# Patient Record
Sex: Female | Born: 1965
Health system: Southern US, Community
[De-identification: ages and names within clinical notes are randomized; demographics above are authoritative.]

## PROBLEM LIST (undated history)

## (undated) DIAGNOSIS — M329 Systemic lupus erythematosus, unspecified: Secondary | ICD-10-CM

## (undated) DIAGNOSIS — M359 Systemic involvement of connective tissue, unspecified: Secondary | ICD-10-CM

## (undated) DIAGNOSIS — I890 Lymphedema, not elsewhere classified: Secondary | ICD-10-CM

## (undated) DIAGNOSIS — M3500 Sicca syndrome, unspecified: Secondary | ICD-10-CM

## (undated) DIAGNOSIS — M1990 Unspecified osteoarthritis, unspecified site: Secondary | ICD-10-CM

## (undated) DIAGNOSIS — I7300 Raynaud's syndrome without gangrene: Secondary | ICD-10-CM

## (undated) DIAGNOSIS — M2550 Pain in unspecified joint: Secondary | ICD-10-CM

## (undated) DIAGNOSIS — R5382 Chronic fatigue, unspecified: Secondary | ICD-10-CM

## (undated) DIAGNOSIS — D6861 Antiphospholipid syndrome: Secondary | ICD-10-CM

## (undated) DIAGNOSIS — IMO0002 Reserved for concepts with insufficient information to code with codable children: Secondary | ICD-10-CM

## (undated) DIAGNOSIS — D696 Thrombocytopenia, unspecified: Secondary | ICD-10-CM

## (undated) DIAGNOSIS — K589 Irritable bowel syndrome without diarrhea: Secondary | ICD-10-CM

## (undated) DIAGNOSIS — F32A Depression, unspecified: Secondary | ICD-10-CM

## (undated) DIAGNOSIS — I82409 Acute embolism and thrombosis of unspecified deep veins of unspecified lower extremity: Secondary | ICD-10-CM

## (undated) DIAGNOSIS — F419 Anxiety disorder, unspecified: Secondary | ICD-10-CM

## (undated) DIAGNOSIS — R760 Raised antibody titer: Secondary | ICD-10-CM

## (undated) DIAGNOSIS — R0789 Other chest pain: Secondary | ICD-10-CM

## (undated) DIAGNOSIS — I2699 Other pulmonary embolism without acute cor pulmonale: Secondary | ICD-10-CM

## (undated) HISTORY — DX: Systemic lupus erythematosus, unspecified: M32.9

## (undated) HISTORY — PX: GANGLION CYST EXCISION: SHX1691

## (undated) HISTORY — PX: APPENDECTOMY: SHX54

## (undated) HISTORY — DX: Reserved for concepts with insufficient information to code with codable children: IMO0002

## (undated) HISTORY — PX: LAPAROTOMY: SHX154

## (undated) HISTORY — DX: Lymphedema, not elsewhere classified: I89.0

## (undated) HISTORY — PX: COLON SURGERY: SHX602

---

## 1999-04-03 ENCOUNTER — Other Ambulatory Visit: Admission: RE | Admit: 1999-04-03 | Discharge: 1999-04-03 | Payer: Self-pay | Admitting: Gynecology

## 2000-04-14 ENCOUNTER — Other Ambulatory Visit: Admission: RE | Admit: 2000-04-14 | Discharge: 2000-04-14 | Payer: Self-pay | Admitting: Gynecology

## 2000-05-09 ENCOUNTER — Other Ambulatory Visit: Admission: RE | Admit: 2000-05-09 | Discharge: 2000-05-09 | Payer: Self-pay | Admitting: Gastroenterology

## 2000-07-17 ENCOUNTER — Encounter: Payer: Self-pay | Admitting: Gastroenterology

## 2000-07-17 ENCOUNTER — Ambulatory Visit (HOSPITAL_COMMUNITY): Admission: RE | Admit: 2000-07-17 | Discharge: 2000-07-17 | Payer: Self-pay | Admitting: Gastroenterology

## 2001-01-19 ENCOUNTER — Encounter: Payer: Self-pay | Admitting: Gastroenterology

## 2001-01-19 ENCOUNTER — Ambulatory Visit (HOSPITAL_COMMUNITY): Admission: RE | Admit: 2001-01-19 | Discharge: 2001-01-19 | Payer: Self-pay | Admitting: Gastroenterology

## 2001-01-22 ENCOUNTER — Ambulatory Visit (HOSPITAL_COMMUNITY): Admission: RE | Admit: 2001-01-22 | Discharge: 2001-01-22 | Payer: Self-pay | Admitting: Gastroenterology

## 2001-01-22 ENCOUNTER — Encounter: Payer: Self-pay | Admitting: Gastroenterology

## 2001-01-26 ENCOUNTER — Encounter: Payer: Self-pay | Admitting: Gastroenterology

## 2001-01-26 ENCOUNTER — Ambulatory Visit (HOSPITAL_COMMUNITY): Admission: RE | Admit: 2001-01-26 | Discharge: 2001-01-26 | Payer: Self-pay | Admitting: Gastroenterology

## 2001-01-29 ENCOUNTER — Encounter (INDEPENDENT_AMBULATORY_CARE_PROVIDER_SITE_OTHER): Payer: Self-pay | Admitting: Specialist

## 2001-01-29 ENCOUNTER — Inpatient Hospital Stay (HOSPITAL_COMMUNITY): Admission: RE | Admit: 2001-01-29 | Discharge: 2001-02-07 | Payer: Self-pay | Admitting: Gastroenterology

## 2001-01-29 ENCOUNTER — Encounter: Payer: Self-pay | Admitting: Internal Medicine

## 2001-01-29 ENCOUNTER — Ambulatory Visit (HOSPITAL_COMMUNITY): Admission: RE | Admit: 2001-01-29 | Discharge: 2001-01-29 | Payer: Self-pay | Admitting: Pulmonary Disease

## 2001-01-29 ENCOUNTER — Encounter: Payer: Self-pay | Admitting: Gastroenterology

## 2001-06-29 ENCOUNTER — Other Ambulatory Visit: Admission: RE | Admit: 2001-06-29 | Discharge: 2001-06-29 | Payer: Self-pay | Admitting: Gynecology

## 2002-07-26 ENCOUNTER — Other Ambulatory Visit: Admission: RE | Admit: 2002-07-26 | Discharge: 2002-07-26 | Payer: Self-pay | Admitting: Gynecology

## 2002-09-09 ENCOUNTER — Encounter: Payer: Self-pay | Admitting: Emergency Medicine

## 2002-09-10 ENCOUNTER — Inpatient Hospital Stay (HOSPITAL_COMMUNITY): Admission: EM | Admit: 2002-09-10 | Discharge: 2002-09-11 | Payer: Self-pay | Admitting: Emergency Medicine

## 2002-09-13 ENCOUNTER — Encounter: Admission: RE | Admit: 2002-09-13 | Discharge: 2002-09-13 | Payer: Self-pay | Admitting: Internal Medicine

## 2002-09-14 ENCOUNTER — Encounter: Admission: RE | Admit: 2002-09-14 | Discharge: 2002-09-14 | Payer: Self-pay | Admitting: Internal Medicine

## 2002-09-16 ENCOUNTER — Encounter: Admission: RE | Admit: 2002-09-16 | Discharge: 2002-09-16 | Payer: Self-pay | Admitting: Internal Medicine

## 2002-09-20 ENCOUNTER — Encounter: Admission: RE | Admit: 2002-09-20 | Discharge: 2002-09-20 | Payer: Self-pay | Admitting: Internal Medicine

## 2002-09-23 ENCOUNTER — Encounter: Payer: Self-pay | Admitting: Internal Medicine

## 2002-09-23 ENCOUNTER — Ambulatory Visit (HOSPITAL_COMMUNITY): Admission: RE | Admit: 2002-09-23 | Discharge: 2002-09-23 | Payer: Self-pay | Admitting: Internal Medicine

## 2002-09-23 ENCOUNTER — Encounter: Admission: RE | Admit: 2002-09-23 | Discharge: 2002-09-23 | Payer: Self-pay | Admitting: Internal Medicine

## 2002-09-24 ENCOUNTER — Encounter: Admission: RE | Admit: 2002-09-24 | Discharge: 2002-09-24 | Payer: Self-pay | Admitting: Internal Medicine

## 2002-09-27 ENCOUNTER — Encounter: Admission: RE | Admit: 2002-09-27 | Discharge: 2002-09-27 | Payer: Self-pay | Admitting: Internal Medicine

## 2002-10-04 ENCOUNTER — Encounter: Admission: RE | Admit: 2002-10-04 | Discharge: 2002-10-04 | Payer: Self-pay | Admitting: Internal Medicine

## 2002-10-21 ENCOUNTER — Ambulatory Visit (HOSPITAL_COMMUNITY): Admission: RE | Admit: 2002-10-21 | Discharge: 2002-10-21 | Payer: Self-pay | Admitting: Internal Medicine

## 2002-10-21 ENCOUNTER — Encounter: Admission: RE | Admit: 2002-10-21 | Discharge: 2002-10-21 | Payer: Self-pay | Admitting: Internal Medicine

## 2003-05-25 ENCOUNTER — Encounter: Payer: Self-pay | Admitting: Gastroenterology

## 2003-05-25 ENCOUNTER — Ambulatory Visit (HOSPITAL_COMMUNITY): Admission: RE | Admit: 2003-05-25 | Discharge: 2003-05-25 | Payer: Self-pay | Admitting: Gastroenterology

## 2003-05-27 ENCOUNTER — Ambulatory Visit (HOSPITAL_COMMUNITY): Admission: RE | Admit: 2003-05-27 | Discharge: 2003-05-27 | Payer: Self-pay | Admitting: Gastroenterology

## 2003-05-27 ENCOUNTER — Encounter: Payer: Self-pay | Admitting: Gastroenterology

## 2003-07-12 ENCOUNTER — Ambulatory Visit (HOSPITAL_COMMUNITY): Admission: RE | Admit: 2003-07-12 | Discharge: 2003-07-12 | Payer: Self-pay | Admitting: Gastroenterology

## 2003-07-28 ENCOUNTER — Other Ambulatory Visit: Admission: RE | Admit: 2003-07-28 | Discharge: 2003-07-28 | Payer: Self-pay | Admitting: Gynecology

## 2003-12-26 ENCOUNTER — Ambulatory Visit (HOSPITAL_COMMUNITY): Admission: RE | Admit: 2003-12-26 | Discharge: 2003-12-26 | Payer: Self-pay

## 2004-08-22 ENCOUNTER — Other Ambulatory Visit: Admission: RE | Admit: 2004-08-22 | Discharge: 2004-08-22 | Payer: Self-pay | Admitting: Gynecology

## 2005-09-18 ENCOUNTER — Other Ambulatory Visit: Admission: RE | Admit: 2005-09-18 | Discharge: 2005-09-18 | Payer: Self-pay | Admitting: Gynecology

## 2009-01-12 ENCOUNTER — Ambulatory Visit: Payer: Self-pay | Admitting: Internal Medicine

## 2011-03-29 NOTE — Op Note (Signed)
   [name], [name]                          ACCOUNT NO.:  [id number]   MEDICAL RECORD NO.:  [id number]                   PATIENT TYPE:  AMB   LOCATION:  ENDO                                 FACILITY:  [facility]   PHYSICIAN:  Vania Rea. Jarold Motto, M.D. [facility]        DATE OF BIRTH:  Mar 15, 1966   DATE OF PROCEDURE:  07/12/2003  DATE OF DISCHARGE:  07/12/2003                                 OPERATIVE REPORT   PROCEDURE:  Esophageal manometry.   RESULTS:  1. Upper esophageal sphincter:  The upper esophageal sphincter is somewhat     difficult to evaluate, but there appears to be normal coordination     between pharyngeal contraction and cricopharyngeal relaxation.  2. Lower esophageal sphincter:  Mean pressure is approximately 15 mmHg with     normal relaxation on swallowing.  3. Motility pattern:  There are normally-propagated peristaltic waves of     normal amplitude and duration throughout the length of the esophagus to     wet and dry swallows.  Mean amplitude of esophageal contractions is 105     mmHg.   ASSESSMENT:  This is a normal esophageal manometry except for borderline  incompetent lower esophageal sphincter.  There is no evidence of underlying  esophageal motility disorder.                                               Vania Rea. Jarold Motto, M.D. [facility]    DRP/MEDQ  D:  07/21/2003  T:  07/22/2003  Job:  [id number]   cc:   Ulyess Mort, M.D. [facility]

## 2011-03-29 NOTE — Discharge Summary (Signed)
Erica [name]                          ACCOUNT NO.:  [id number]   MEDICAL RECORD NO.:  [id number]                   PATIENT TYPE:  INP   LOCATION:  4713                                 FACILITY:  [facility]   PHYSICIAN:  Catalina Pizza, M.D.                     DATE OF BIRTH:  [birth year]   DATE OF ADMISSION:  09/09/2002  DATE OF DISCHARGE:  09/11/2002                                 DISCHARGE SUMMARY   DISCHARGE DIAGNOSES:  1. Bilateral pulmonary emboli.  2. Left lower extremity deep venous thrombosis.  3. Anxiety/depression.   DISCHARGE MEDICATIONS:  1. Lovenox 45 mg injection subcu q.12h.  2. Coumadin 5 mg p.o. q.d.   CHIEF COMPLAINT:  Calf and ankle pain and swelling.   HISTORY OF PRESENT ILLNESS:  The patient is a 45 year old white female with  unclear past medical history.  The patient said that she had had a previous  stomach problem and some rheumatologic work-up mentioning antiphospholipid  syndrome but no treatment with Coumadin or anything else.  No history of  abortion.  No history of previous DVT or PE.  Positive family history in  that mother had PEs at the age of 61.  The patient complained of pain in  left lower extremity about six to eight weeks and progressively has gotten  worse over the last several days prior to admission.  She felt like it was  bruised. She also noted that she had had some swelling in her left ankle as  well.  She had some shortness of breath a week prior to admission but  attributed this to anxiety.  No chest pain, no cough, no hemoptysis.   ALLERGIES:  SULFA.   PAST MEDICAL HISTORY:  1. Stomach problem x5 years, question IBS.  2. Status post abdominal surgery for ruptured bowel in 2002 after     colonoscopy.  3. Raynaud's.  4. Antiphospholipid syndrome diagnosed approximately two years ago.  5. 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  6. Anxiety.   MEDICATION PRIOR TO ADMISSION:  Ortho Novum.   HABITS:  She smokes about a half a  pack per day x5 years.  Occasional  alcohol consumption.  No other drug use.   SOCIAL HISTORY:  She is married.  She is a Designer, industrial/product at [employer].  She lives  alone.  She has other insurance for her prescriptions.   FAMILY HISTORY:  Mother had a history of PE at age 55, father is healthy.  Sister has Ewing's sarcoma.   LABORATORY DATA:  The labs on discharge or during admission showed  hemoglobin 10.4, hematocrit 31.7, platelet count 156.  PT of 18.1, INR 1.6,  PTT 36.  Sodium 134, potassium 3.3, chloride 98, CO2 28, glucose 90, BUN 2,  creatinine 0.7, calcium 8.5, total protein 5.9, albumin 3.0.  AST 20, ALT  15, alkaline phosphatase 49, total bilirubin 0.5.  TSH 2.486.   CT scan showed bilateral pulmonary emboli at least three nodular opacities  within the lungs as described.  Consider interval follow-up in four to five  months.  Lower extremity DVT within the left popliteal vein.  No other  evidence for DVT.   HOSPITAL COURSE:  #1 - PULMONARY EMBOLI AND DEEP VENOUS THROMBOSIS:  Throughout hospitalization the swelling and pain decreased in the left lower  extremity.  The patient never reported having any shortness of breath  while  in the hospital.  She was initially started on Lovenox 1 mg/kg and also  started on Coumadin.  In referring back to old records which she had  attained by Dr. Syliva Overman to help sort out exactly her rheumatologic  disease.  Records were attained from 2001 which showed Sojourn's anti-SSA of  30 units.  It also showed anticardiolipin antibody IgM at 28 and IgG of less  than 6.  The patient was set up for follow-up within two days after  discharge, on September 13, 2002, to see Dr. Margo Aye for further evaluation and  PT/INR to adjust Coumadin appropriately.  The patient was told not to  continue her birth control pills until later date due to the risk of  increasing risk of pulmonary embolism or recurrent DVT.   #2 - SMOKING CESSATION:  The patient was  encouraged to decrease her smoking,  given her current risk factors.  There was understanding of this and agreed  that she would try to limit her smoking habits.   DISPOSITION:  The patient  is to return to outpatient clinic at [facility]  [facility] on Monday, September 13, 2002, at 1:30 for further lab  work and evaluation.  She is to return to the emergency department if any  symptoms of shortness of breath or pain in chest, swelling or pain in the  legs returns                                               Catalina Pizza, M.D.    ZH/MEDQ  D:  09/21/2002  T:  09/22/2002  Job:  [id number]   cc:   Alvester Morin, M.D.

## 2011-03-29 NOTE — H&P (Signed)
[facility]  Patient:    Erica [name], Erica [name]                       MRN: [id number] Adm. Date:  [id number] Disc. Date: [id number] Attending:  Charmaine Downs Dictator:   Mike Gip, P.A.                         History and Physical  CHIEF COMPLAINT:  Abdominal pain.  HISTORY OF PRESENT ILLNESS:  Erica [name] is a very pleasant 45 year old white female with history of recently diagnosed Sjogrens syndrome and antiphospholipid disorder.  There is a question of a mixed connective tissue disease.  She also has Raynauds.  The patient has been undergoing evaluation for complaints of persistent epigastric pain exacerbated by meals and associated nausea, abdominal bloating, and discomfort; all again worse postprandially.  She also has difficulty with constipation.  The patient has sustained about a 5-pound weight loss over the past several months from 109 pounds to 104 pounds currently.  She says she hurts when she eats and has not been eating regular meals for several months due to pain.  She has been using Aciphex on a p.r.n. basis and Lipram on a p.r.n. basis.  She has undergone gastric emptying scan, which was normal.  Upper endoscopy with a question of gastric atony.  She did have small bowel biopsies done, which were negative.  More recently, she underwent CT scan of the abdomen and pelvis on January 19, 2001 showing mildly prominent loops of small bowel in the left mid abdomen; otherwise negative exam.  Small bowel follow-through was suggested and she had this on January 27, 2001, which was normal.  The patient then underwent colonoscopy today for further evaluation and was noted to have a dilated colon but no other abnormality.  A random biopsy was taken from the right colon.  The patient returned home postprocedure but developed abdominal pain in the lower abdomen and called the office.  She was advised to come to x-ray, where KUB shows an apparent  pneumatosis of the left colon and no free air.  The patient is admitted at this time for bowel rest, pain control, and urgent CT scan of the abdomen and pelvis.  CURRENT MEDICATIONS: 1. Aciphex 20 mg p.r.n. 2. Birth control pill daily. 3. Lipram 1 p.o. a.c. p.r.n. 4. Reglan 5 mg a.c. p.r.n.  ALLERGIES:  SULFA.  PAST MEDICAL HISTORY:  Prior appendectomy.  FAMILY HISTORY:  One sister deceased with Ewings sarcoma.  No other GI history.  SOCIAL HISTORY:  The patient is married.  She is employed full-time.  No children.  She is a smoker of 1/2 pack per day.  No regular ETOH.  REVIEW OF SYSTEMS:  Cardiovascular:  Denies any chest pain or anginal symptoms.  Pulmonary:  Negative for cough, shortness of breath, or sputum production.  Genitourinary:  Negative.  Musculoskeletal:  Pertinent for Raynauds symptoms.  PHYSICAL EXAMINATION:  GENERAL:  A well-developed, thin white female in no acute distress.  VITAL SIGNS:  In the emergency room, temperature is 97, pulse 78, respirations 18.  Blood pressure 70/32 initially, with fluids up to 96/58.  ABDOMEN:  Soft, nondistended.  Bowel sounds are present but hypoactive.  She is tender in the right and left lower quadrant, right greater then left.  No guarding.  She does have rebound on both lower quadrants.  No mass or hepatosplenomegaly.  RECTAL:  Exam is not  done at this time.  EXTREMITIES:  No clubbing, cyanosis, or edema.  Fingertips are cool.  LABORATORY DATA:  Labs on admission show a WBC of 17.6, hemoglobin 11.7, hematocrit 34.0, MCV 90, platelets 145.  Potassium 3.4, BUN 5, creatinine 0.9. Liver function studies are normal.  PTT 29, pro time 14.7, INR 1.3.  IMPRESSION: 75. A 45 year old white female with acute abdominal pain post colonoscopy with    apparent pneumatosis of the descending colon on plain films.  Rule out    perforation. 2. Underlying persistent nausea, epigastric pain, and weight loss possibly    related to  connective tissue disorder. 3. Sjogrens syndrome and Raynauds. 4. Status post appendectomy.  PLAN:  The patient is admitted to the service of Dr. Yancey Flemings, who is covering the hospital.  She will be kept n.p.o., hydrated with fluids.  We will cover her with IV Unasyn, check urgent CT scan of the abdomen and pelvis with contrast.  She may need surgical consultation pending results of the CT. She will also be covered with IV Protonix.  If CT scan does not show any evidence of free air, we will repeat plain films in the a.m. and continue her at bowel rest.DD:  01/30/01 TD:  01/31/01 Job: [id number]

## 2011-03-29 NOTE — Consult Note (Signed)
[facility]  Patient:    Erica [name], Erica [name]                       MRN: [id number] Proc. Date: 01/29/01 Adm. Date:  [id number] Disc. Date: [id number] Attending:  Charmaine Downs CC:         Hedwig Morton. Juanda Chance, M.D. [facility]   Consultation Report  REASON FOR CONSULTATION:  Bowel perforation, free air.  CLINICAL HISTORY:  This patient is a 45 year old whose history is obtained both from the patient from the chart and from Dr. Juanda Chance.  She is a 45 year old woman being evaluated for chronic abdominal pain. She is also apparently diagnosed with Raynauds phenomenom and Sjogrens syndrome. She has had a CT scan that apparently showed a little dilated small bowel but was otherwise negative. She has had a gastric emptying scan in the past that was negative and apparently she recently had an upper endoscopy that was negative.  This morning she had a colonoscopy with some mucosal biopsies. Reportedly the colonoscopy was basically negative. She returned home after the procedure but developed severe abdominal pain in her lower abdomen and x-ray earlier today apparently showed pneumatosis of the left colon but no free air. She was admitted for bowel rest, fluids, pain control, and a CT.  PAST MEDICAL HISTORY:  Noted in the admission note, not redictated here.  LABORATORY DATA:  Initial laboratory studies showed a white count of 12,000. CT scan was finally obtained late this evening and showed what appears to be a large amount of free air in the abdomen with some contrast from a prior small bowel series possibly having leaked into the peritoneal cavity. In seeing the patient along with Dr. Juanda Chance, the patient and her husband relayed that her pain gas gotten worse this afternoon since she came to the emergency room although it did let up a little bit after some Demerol. She continues to complain of pain across the entire lower abdomen. She is not having pain any place  else.  PHYSICAL EXAMINATION:  GENERAL:  The patient is a very thin, nontoxic appearing female. She is alert and oriented.  VITAL SIGNS:  Pulses 78, blood pressure about 90/60.  HEENT:  Head is normocephalic. Eyes are nonicteric. Pupils are round and regular.  NECK:  Supple, no masses or thyromegaly.  LUNGS:  Clear to auscultation.  HEART:  Regular. No murmurs, rubs or gallops are heard.  ABDOMEN:  Relative soft, slightly distended but diffusely tender throughout with rebound tenderness. Bowel sounds are still present.  EXTREMITIES:  Show no cyanosis or edema.  As noted, I reviewed the CT scan with the above findings. Repeat white count this evening had changed from 12,000 earlier to 17,000.  IMPRESSION:  Apparent bowel perforation not clinically improving with elevation in white count, increased pain and increased free air.  PLAN:  I think we ought to proceed to exploratory laparotomy and not sure where the perforation is based on current findings but since she has not gotten a rigid abdomen and ______, I think this must be a fairly small perforation. I have discussed this with the patient and with the husband. The husband gives informed consent the patient has recently had Demerol. I think that all questions for both of them are answered. DD:  01/30/01 TD:  01/30/01 Job: [id number]

## 2012-07-09 ENCOUNTER — Other Ambulatory Visit: Payer: Self-pay | Admitting: Internal Medicine

## 2012-07-09 DIAGNOSIS — I83819 Varicose veins of unspecified lower extremities with pain: Secondary | ICD-10-CM

## 2012-07-14 ENCOUNTER — Other Ambulatory Visit: Payer: Self-pay

## 2012-07-14 ENCOUNTER — Inpatient Hospital Stay
Admission: RE | Admit: 2012-07-14 | Discharge: 2012-07-14 | Payer: Self-pay | Source: Ambulatory Visit | Attending: Internal Medicine | Admitting: Internal Medicine

## 2012-08-06 ENCOUNTER — Ambulatory Visit
Admission: RE | Admit: 2012-08-06 | Discharge: 2012-08-06 | Disposition: A | Discharge status: Home / Self Care | Payer: 59 | Source: Ambulatory Visit | Attending: Internal Medicine | Admitting: Internal Medicine

## 2012-08-06 DIAGNOSIS — I83819 Varicose veins of unspecified lower extremities with pain: Secondary | ICD-10-CM

## 2012-08-06 HISTORY — DX: Anxiety disorder, unspecified: F41.9

## 2012-08-06 HISTORY — DX: Pain in unspecified joint: M25.50

## 2012-08-06 HISTORY — DX: Acute embolism and thrombosis of unspecified deep veins of unspecified lower extremity: I82.409

## 2012-08-06 HISTORY — DX: Raised antibody titer: R76.0

## 2012-08-06 HISTORY — DX: Systemic involvement of connective tissue, unspecified: M35.9

## 2012-08-06 HISTORY — DX: Sjogren syndrome, unspecified: M35.00

## 2012-08-06 HISTORY — DX: Other pulmonary embolism without acute cor pulmonale: I26.99

## 2012-08-06 HISTORY — DX: Other chest pain: R07.89

## 2012-08-06 HISTORY — DX: Thrombocytopenia, unspecified: D69.6

## 2012-08-06 HISTORY — DX: Raynaud's syndrome without gangrene: I73.00

## 2012-08-06 HISTORY — DX: Chronic fatigue, unspecified: R53.82

## 2012-08-06 HISTORY — DX: Antiphospholipid syndrome: D68.61

## 2012-08-06 IMAGING — US US EXTREM LOW VENOUS BILAT
1 series · 13 of 24 positions shown · non-contrast
Comparison: None.

CLINICAL DATA: Lower extremity varicose veins, history of DVT,
evaluate for venous insufficiency

BILATERAL LOWER EXTREMITY VENOUS DUPLEX ULTRASOUND
TECHNIQUE: Gray-scale sonography with graded compression, as well
as color Doppler and duplex ultrasound, were performed to evaluate
the deep and superficial veins of both lower extremities.  Spectral
Doppler was utilized to evaluate flow at rest and with distal
augmentation maneuvers.  A complete superficial venous
insufficiency exam was performed in the upright standing position.
I personally performed the technical portion of the exam.

[Series 1: us extrem low venous bilat · 13 of 55 slices shown]
[im 1/55]
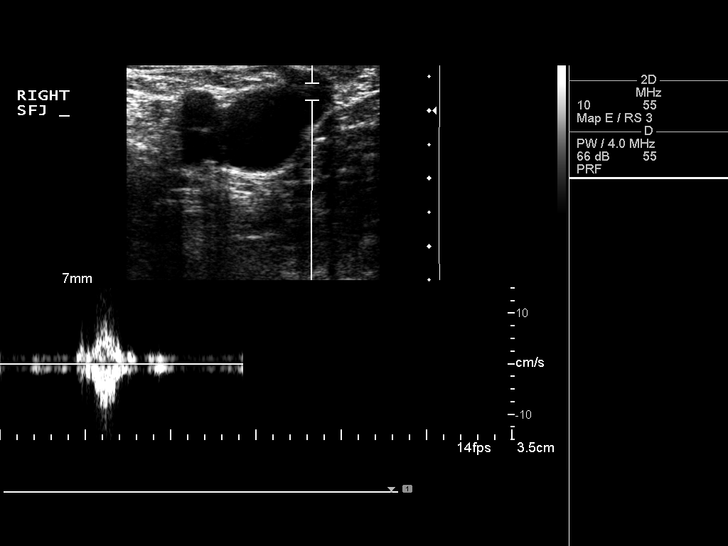
[im 5/55]
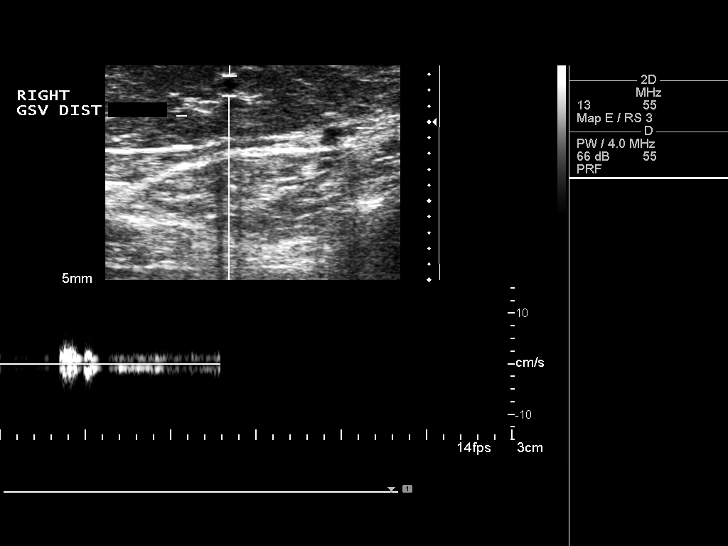
[im 10/55]
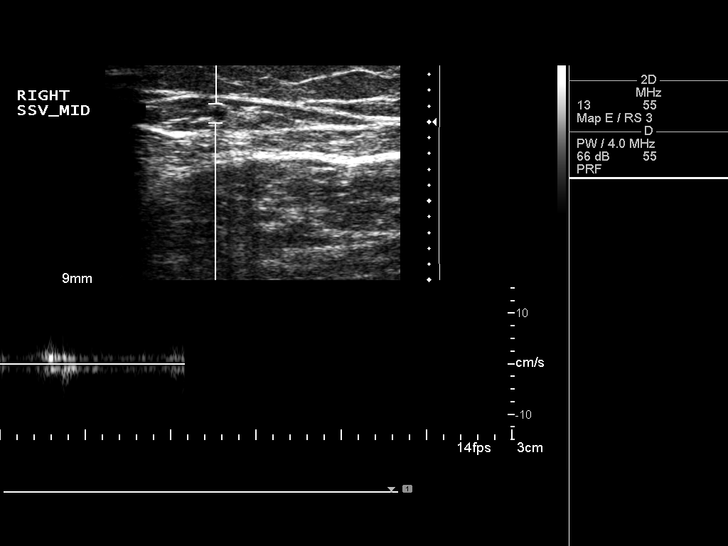
[im 15/55]
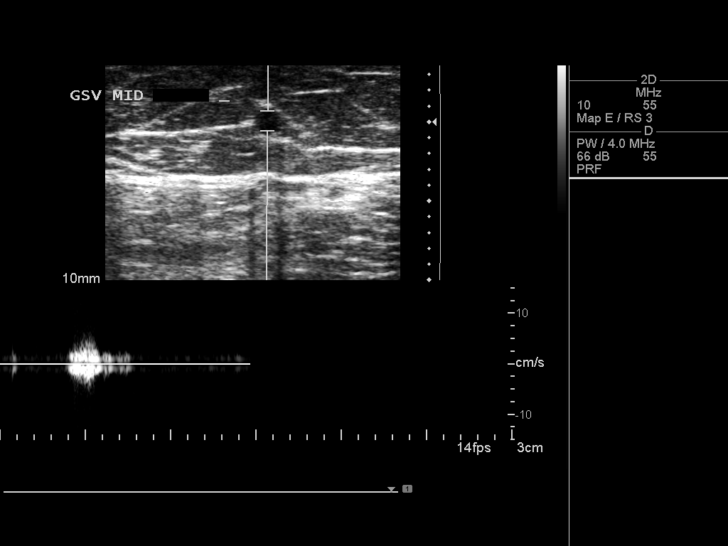
[im 19/55]
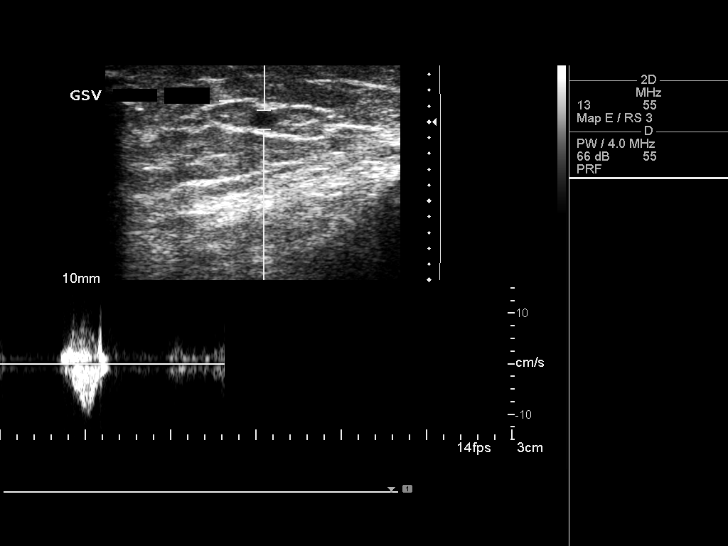
[im 24/55]
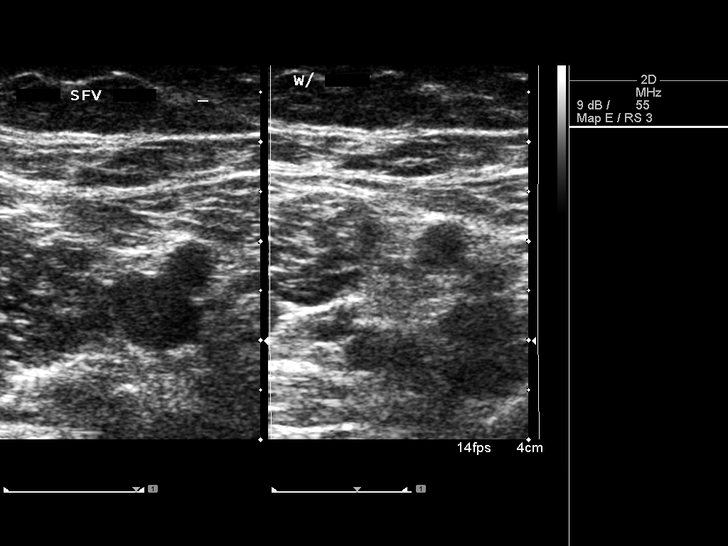
[im 29/55]
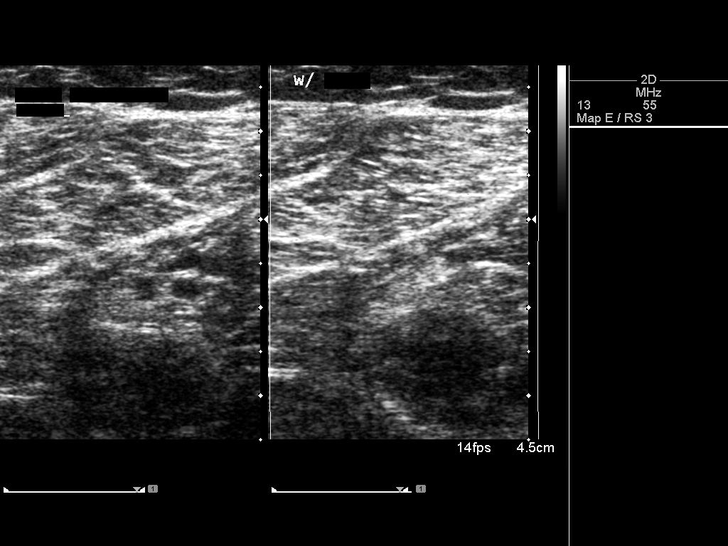
[im 31/55]
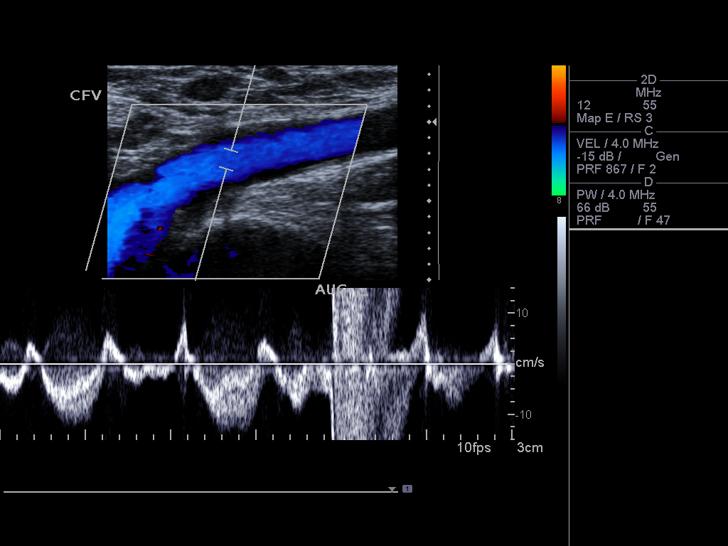
[im 36/55]
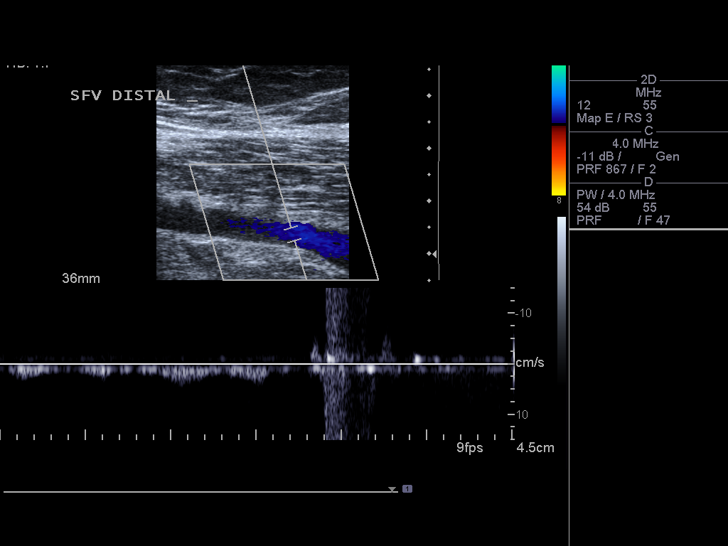
[im 40/55]
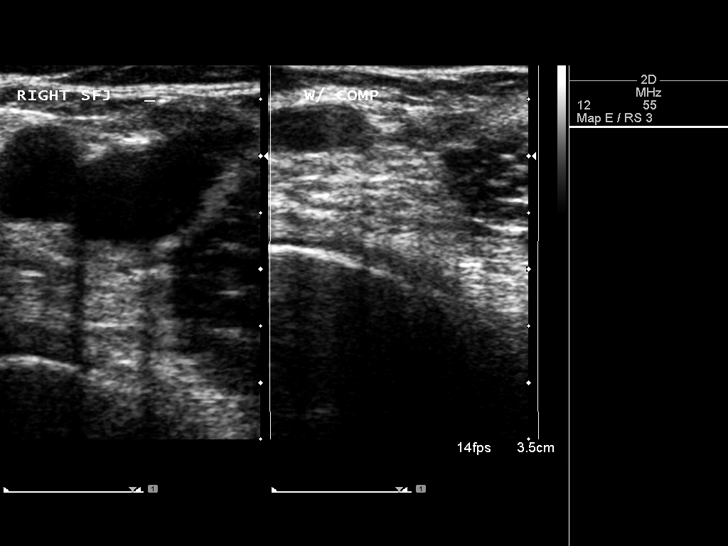
[im 45/55]
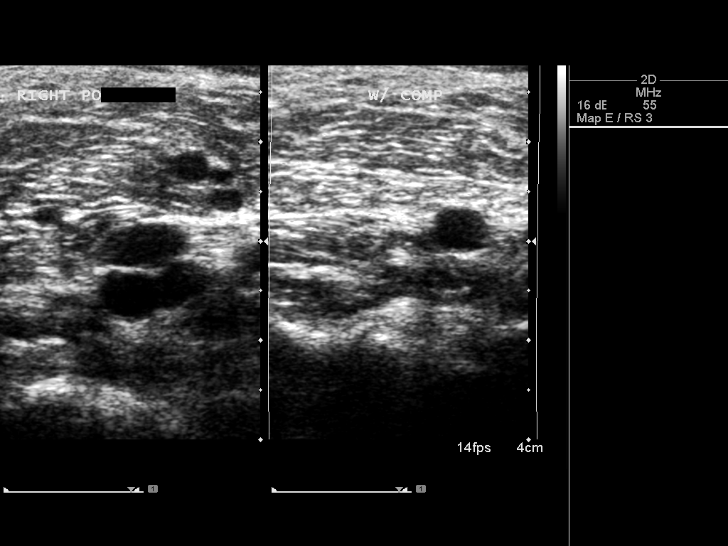
[im 50/55]
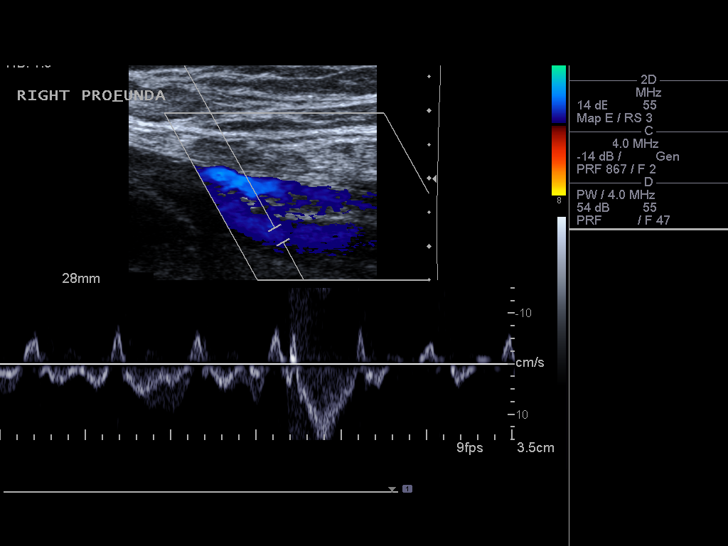
[im 55/55]
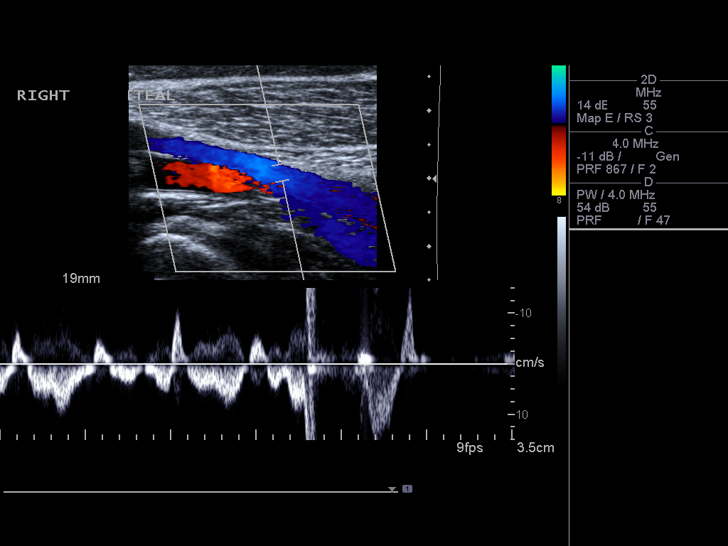

[13 of 24 positions shown; findings below may reference images not displayed]

FINDINGS: Right Lower Extremity:  Patent normal right saphenofemoral junction
without venous insufficiency.  Right G S V is normal in caliber
without significant insufficiency throughout the right lower
extremity.  No significant sub surface branching varicosities.
Right small saphenous vein is also normal without venous
insufficiency.  Right lower extremity deep veins demonstrate normal
compressibility, phasicity and augmentation without DVT.

Left Lower Extremity:  Patent normal left saphenofemoral junction.
Left G S V is also normal in caliber without significant venous
insufficiency throughout the left leg.  The left small saphenous
vein is also normal without reflux.  The left lower extremity deep
veins demonstrate normal compressibility, phasicity and
augmentation without DVT.
IMPRESSION: Negative for significant superficial G S V or S S V venous
insufficiency.  No significant sub surface branching varicosities

Negative for DVT

## 2012-11-05 ENCOUNTER — Other Ambulatory Visit: Payer: Self-pay | Admitting: Interventional Radiology

## 2012-11-05 DIAGNOSIS — I83813 Varicose veins of bilateral lower extremities with pain: Secondary | ICD-10-CM

## 2012-12-10 ENCOUNTER — Encounter: Payer: Self-pay | Admitting: Radiology

## 2012-12-29 ENCOUNTER — Ambulatory Visit
Admission: RE | Admit: 2012-12-29 | Discharge: 2012-12-29 | Disposition: A | Discharge status: Home / Self Care | Payer: 59 | Source: Ambulatory Visit | Attending: Interventional Radiology | Admitting: Interventional Radiology

## 2012-12-29 DIAGNOSIS — I83813 Varicose veins of bilateral lower extremities with pain: Secondary | ICD-10-CM

## 2013-02-10 ENCOUNTER — Telehealth: Payer: Self-pay | Admitting: Emergency Medicine

## 2013-02-10 NOTE — Telephone Encounter (Signed)
PT CALLED BACK FOR [place] TO EXPLAIN THAT HER INS. HAD DENIED THE FOAM SCLERO.  DR Miles Costain WANTS TO F/U WITH THE PT. AGAIN IN AUG. 2014.  WE WILL CALL THE PT. WHEN THE SCHEDULE BECOMES AVAILABLE.

## 2013-04-15 ENCOUNTER — Other Ambulatory Visit: Payer: Self-pay | Admitting: Gynecology

## 2013-05-18 ENCOUNTER — Other Ambulatory Visit (HOSPITAL_COMMUNITY): Payer: Self-pay | Admitting: Interventional Radiology

## 2013-05-18 DIAGNOSIS — I8393 Asymptomatic varicose veins of bilateral lower extremities: Secondary | ICD-10-CM

## 2013-07-07 ENCOUNTER — Ambulatory Visit
Admission: RE | Admit: 2013-07-07 | Discharge: 2013-07-07 | Disposition: A | Discharge status: Home / Self Care | Payer: 59 | Source: Ambulatory Visit | Attending: Interventional Radiology | Admitting: Interventional Radiology

## 2013-07-07 DIAGNOSIS — I8393 Asymptomatic varicose veins of bilateral lower extremities: Secondary | ICD-10-CM

## 2013-08-16 ENCOUNTER — Other Ambulatory Visit (HOSPITAL_COMMUNITY): Payer: Self-pay | Admitting: Interventional Radiology

## 2013-08-16 DIAGNOSIS — I8393 Asymptomatic varicose veins of bilateral lower extremities: Secondary | ICD-10-CM

## 2013-09-21 ENCOUNTER — Other Ambulatory Visit (HOSPITAL_COMMUNITY): Payer: Self-pay | Admitting: Interventional Radiology

## 2013-09-21 ENCOUNTER — Ambulatory Visit
Admission: RE | Admit: 2013-09-21 | Discharge: 2013-09-21 | Disposition: A | Discharge status: Home / Self Care | Payer: 59 | Source: Ambulatory Visit | Attending: Interventional Radiology | Admitting: Interventional Radiology

## 2013-09-21 DIAGNOSIS — I8393 Asymptomatic varicose veins of bilateral lower extremities: Secondary | ICD-10-CM

## 2013-09-21 DIAGNOSIS — I83813 Varicose veins of bilateral lower extremities with pain: Secondary | ICD-10-CM

## 2013-09-21 IMAGING — US US INJEC SCLEROTHERAPY SINGLE
1 series · 3 of 3 positions shown · non-contrast
Comparison: [DATE]

CLINICAL DATA: Right lower extremity dilated ectatic reticular
veins and spider veins, right leg pain

EXAM:
ULTRASOUND INJECTION SCLEROTHERAPHY SINGLE
TECHNIQUE: Preliminary venous mapping performed of the right lower extremity
distal thigh and calf reticular veins and spider veins. This was
performed with ultrasound.

[Series 1: us injec sclerotherapy single · 3 acquisitions, 3 frames shown]
[im 1/3]
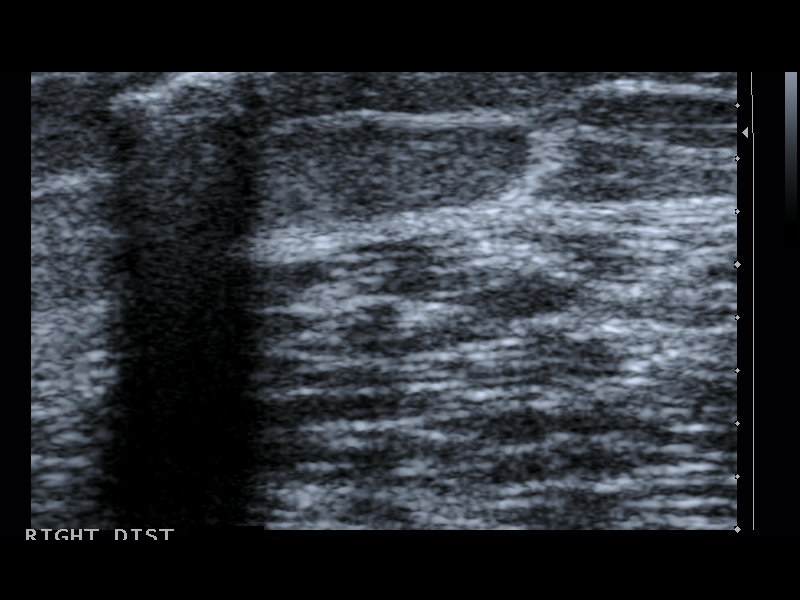
[im 2/3]
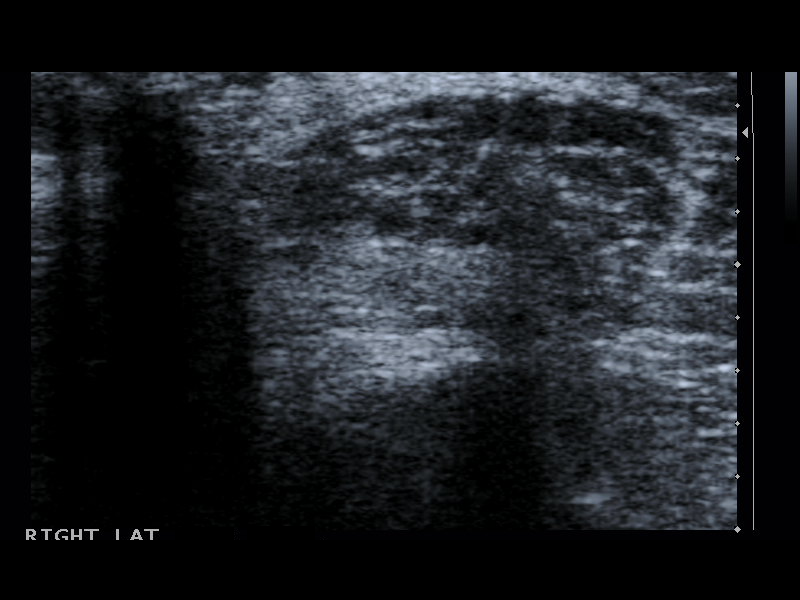
[im 3/3]
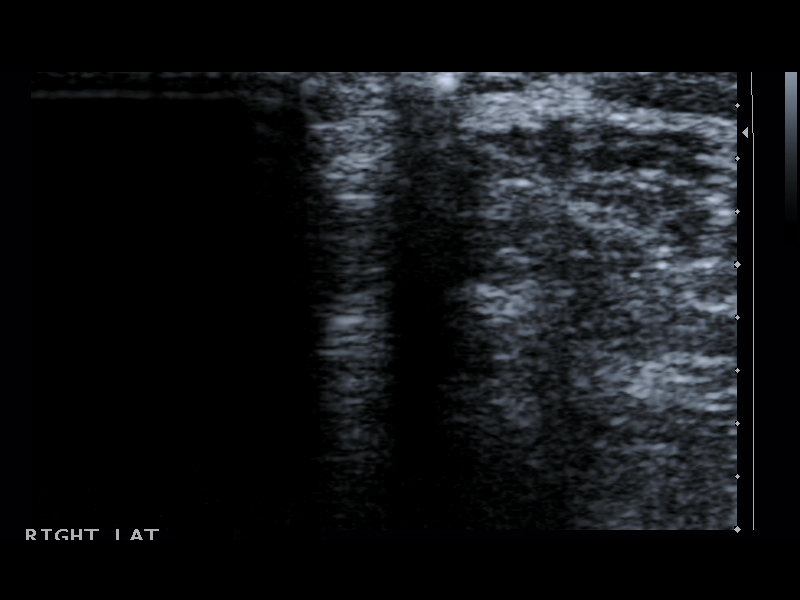

[3 of 3 positions shown; findings below may reference images not displayed]

Under sterile conditions and ultrasound guidance, multi-stick
injection sclerotherapy was performed throughout the right distal
thigh and calf dilator reticular vein network. There was good
dispersal of the 1% FELISILDA throughout the superficial reticular venous
network. Again, injections were performed in the anterior distal
thigh, proximal lateral calf, and posterior calf.

Superficial spider veins were also injected in the anterior medial
thigh.

Patient tolerated the injections well. No immediate complication.
FINDINGS: Ultrasound confirms injection sclerotherapy of the right lower
extremity reticular veins as above
IMPRESSION: Successful ultrasound left distal thigh and calf ectatic reticular
vein and spider vein sclerotherapy, (total volume 4 cc 1% FELISILDA)

## 2013-10-21 ENCOUNTER — Ambulatory Visit
Admission: RE | Admit: 2013-10-21 | Discharge: 2013-10-21 | Disposition: A | Discharge status: Home / Self Care | Payer: 59 | Source: Ambulatory Visit | Attending: Interventional Radiology | Admitting: Interventional Radiology

## 2013-10-21 ENCOUNTER — Other Ambulatory Visit (HOSPITAL_COMMUNITY): Payer: Self-pay | Admitting: Interventional Radiology

## 2013-10-21 DIAGNOSIS — I83812 Varicose veins of left lower extremities with pain: Secondary | ICD-10-CM

## 2013-10-21 DIAGNOSIS — I83813 Varicose veins of bilateral lower extremities with pain: Secondary | ICD-10-CM

## 2013-10-21 IMAGING — US US RADIOLOGIST EVAL AND MGMT
1 series · 8 of 8 positions shown · non-contrast
Comparison: none

[Series 1: us radiologist eval and mgmt · 8 of 8 slices shown]
[im 1/8]
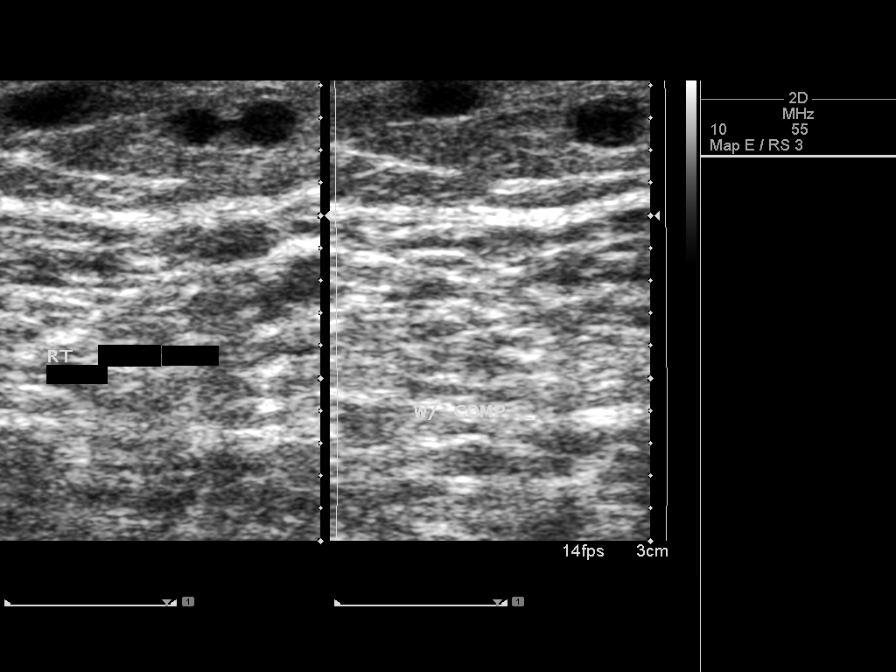
[im 2/8]
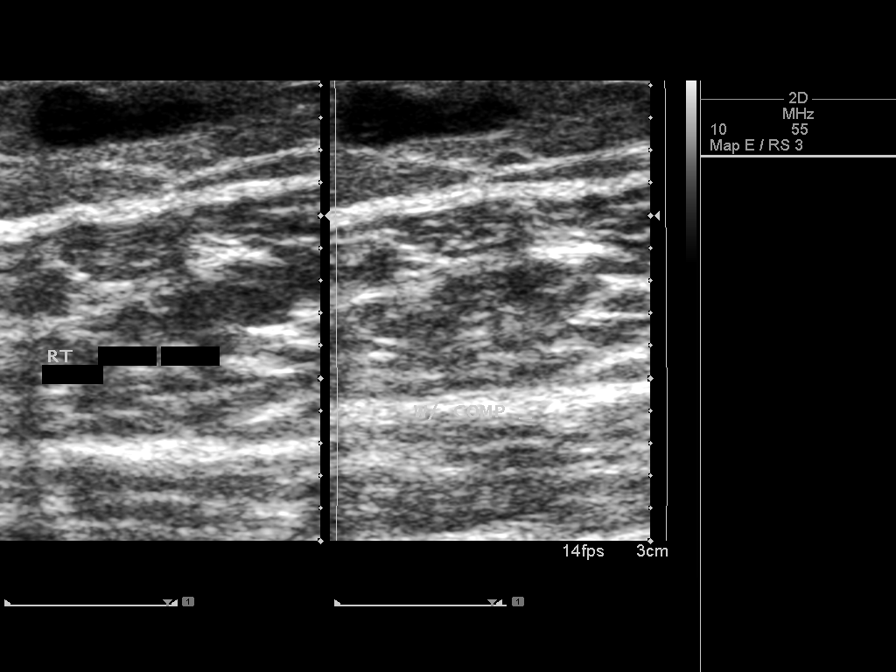
[im 3/8]
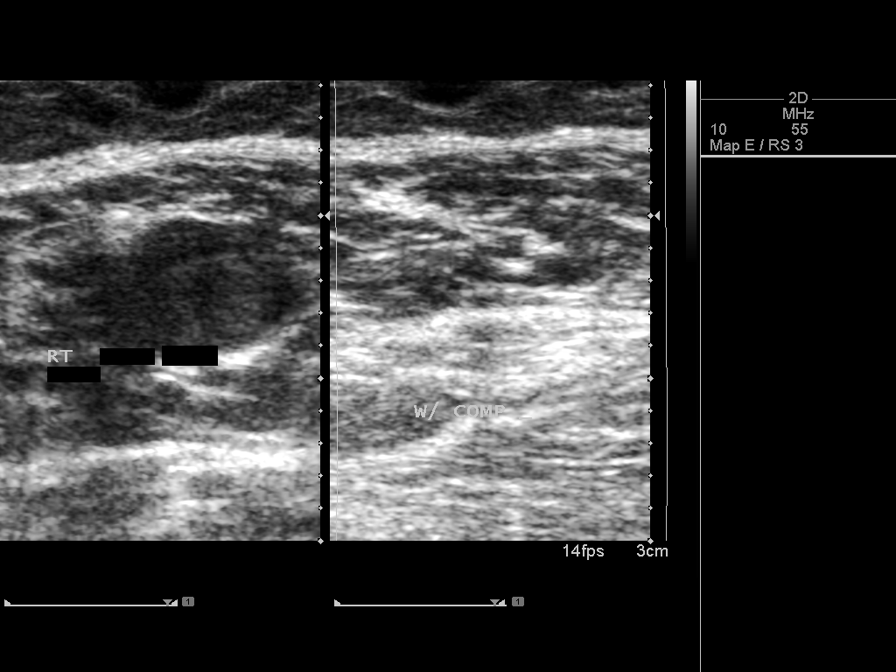
[im 4/8]
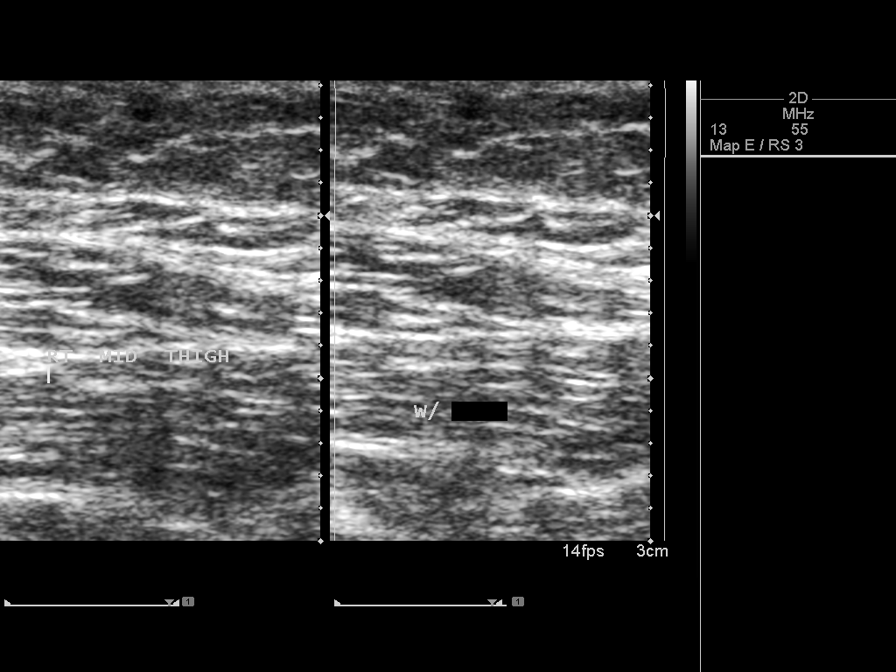
[im 5/8]
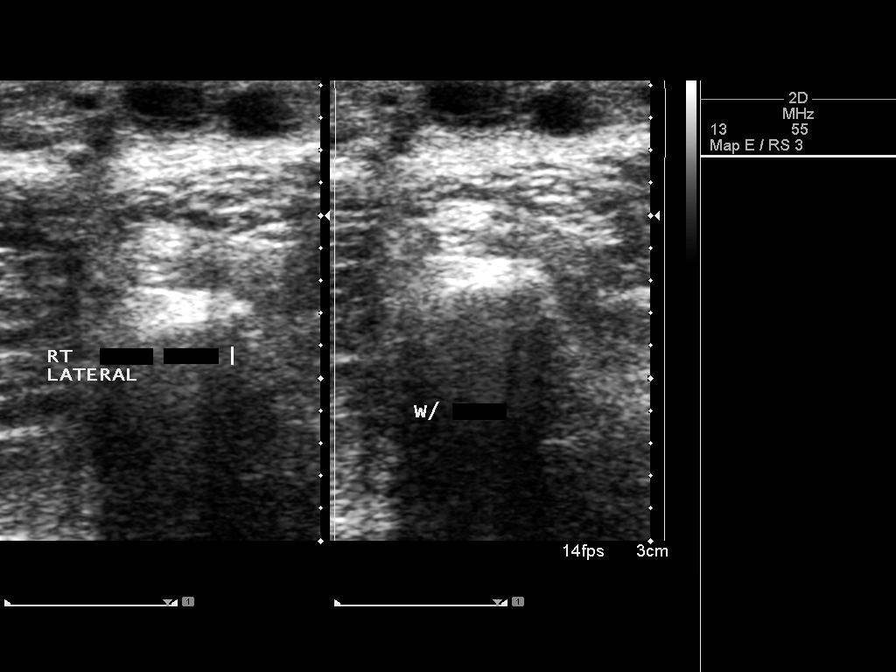
[im 6/8]
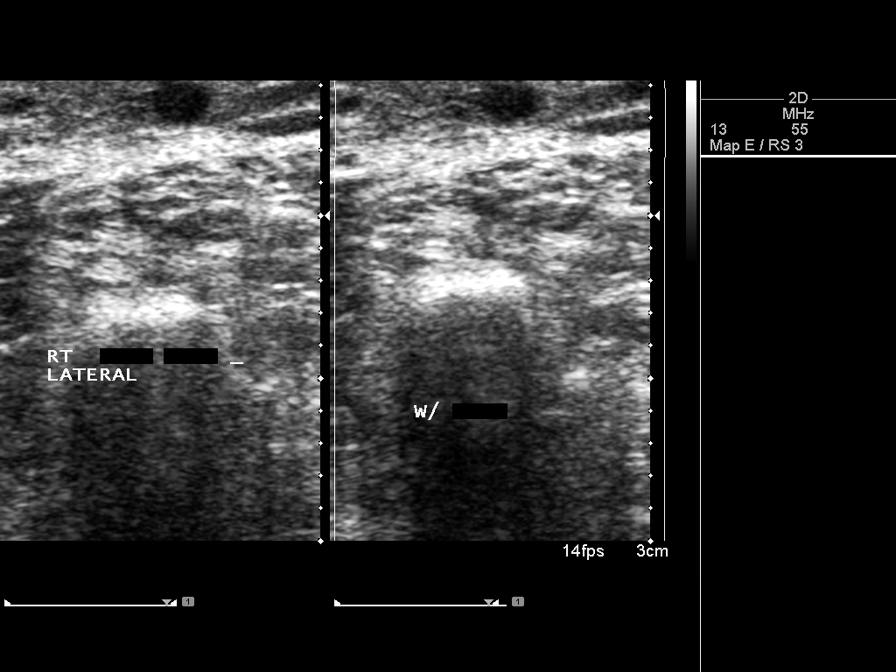
[im 7/8]
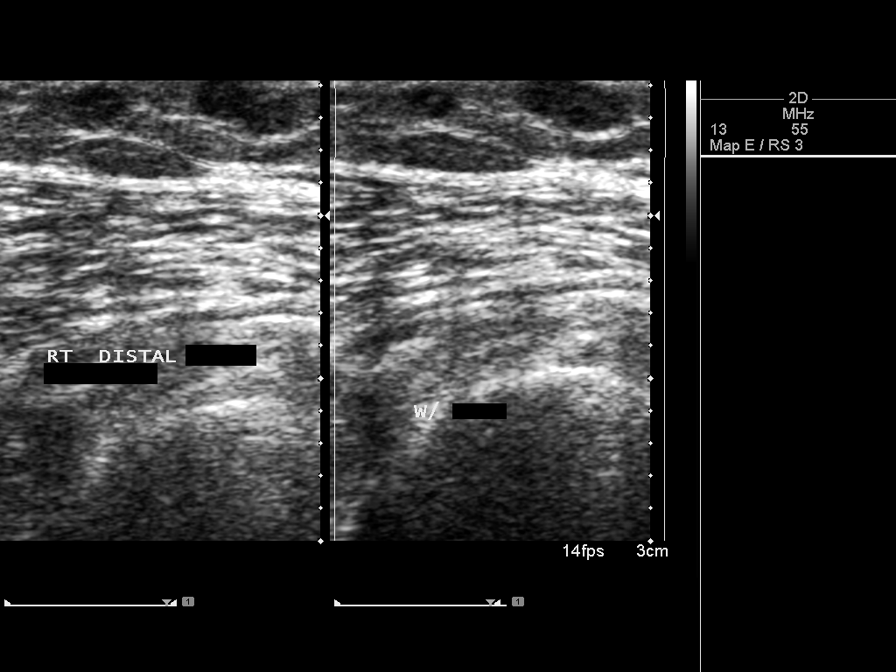
[im 8/8]
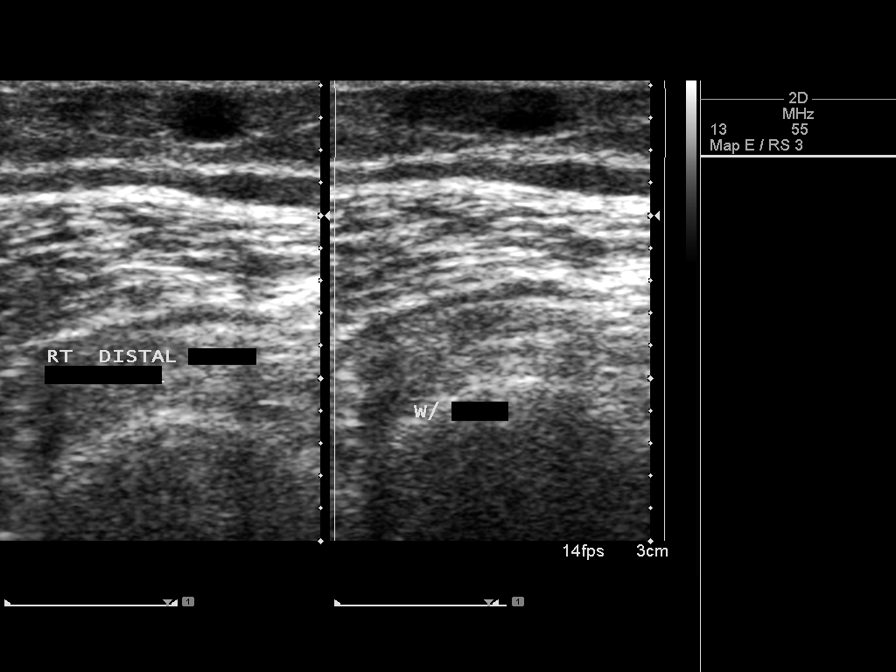

[8 of 8 positions shown; findings below may reference images not displayed]

EXAM:
Established Patient Outpatient Visit:   Level 2

HISTORY OF PRESENT ILLNESS:
47-year-old female who underwent right lower extremity injection
sclerotherapy throughout the right distal thigh, and calf region.
Over the last month she has recovered very well. She has been
compliant with the compression stockings. No interval fevers or
phlebitis. No significant leg pain or discomfort.

CHIEF COMPLAINT:
One month status post right lower extremity reticular vein and
spider veins clear therapy

PHYSICAL EXAMINATION:
Right lower extremity demonstrates thrombosis of the injected
superficial spider and reticular veins in the right distal thigh and
proximal calf. All of the injected sites demonstrates superficial
thrombosis. Minor hyper pigmentation at the injection sites in the
distal thigh. No delayed complication.

IMAGING: This was dictated separately by confirms superficial
thrombosis of the injected fine calf sites.

ASSESSMENT AND PLAN:
Assessment: 1 month status post right lower extremity injection
sclerotherapy. Injected ectatic reticular veins and spider veins
remain thrombosed.

Plan: Left lower extremity injection will be performed today for
similar spider and reticular veins. Follow-up in 1 month.

## 2013-11-18 ENCOUNTER — Ambulatory Visit
Admission: RE | Admit: 2013-11-18 | Discharge: 2013-11-18 | Disposition: A | Discharge status: Home / Self Care | Payer: 59 | Source: Ambulatory Visit | Attending: Interventional Radiology | Admitting: Interventional Radiology

## 2013-11-18 DIAGNOSIS — I83812 Varicose veins of left lower extremities with pain: Secondary | ICD-10-CM

## 2013-11-18 IMAGING — US US RADIOLOGIST EVAL AND MGMT
1 series · 2 of 2 positions shown · non-contrast
Comparison: none

[Series 1: us radiologist eval and mgmt · 0.08mm/px · 2 of 2 slices shown]
[im 1/2]
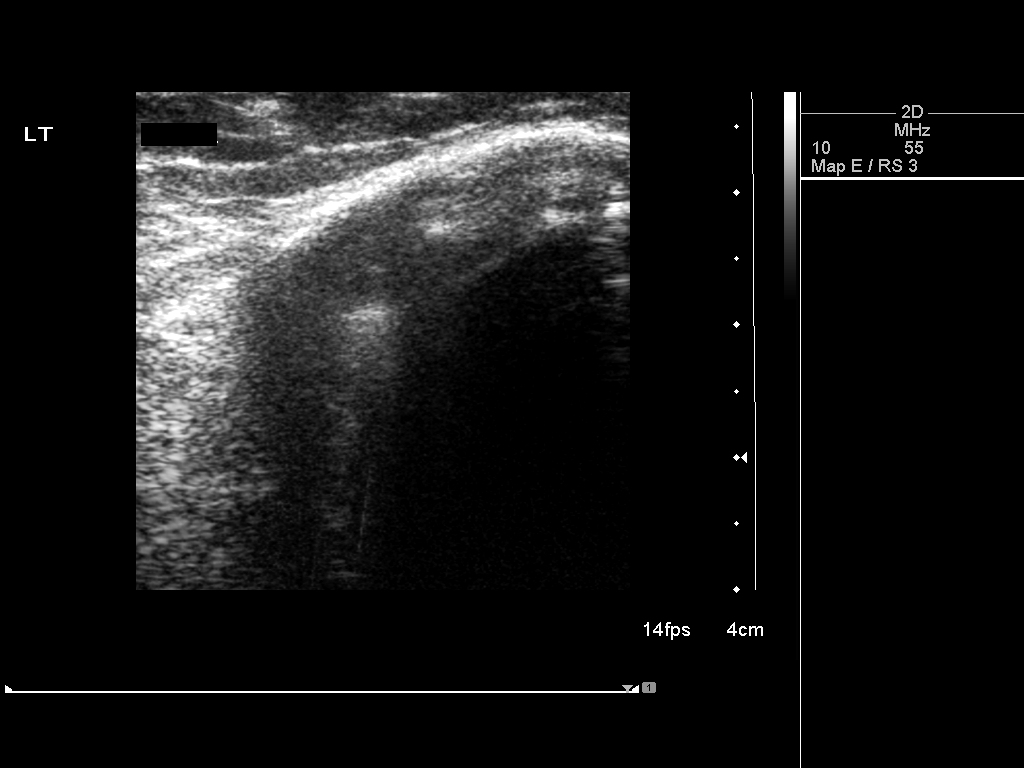
[im 2/2]
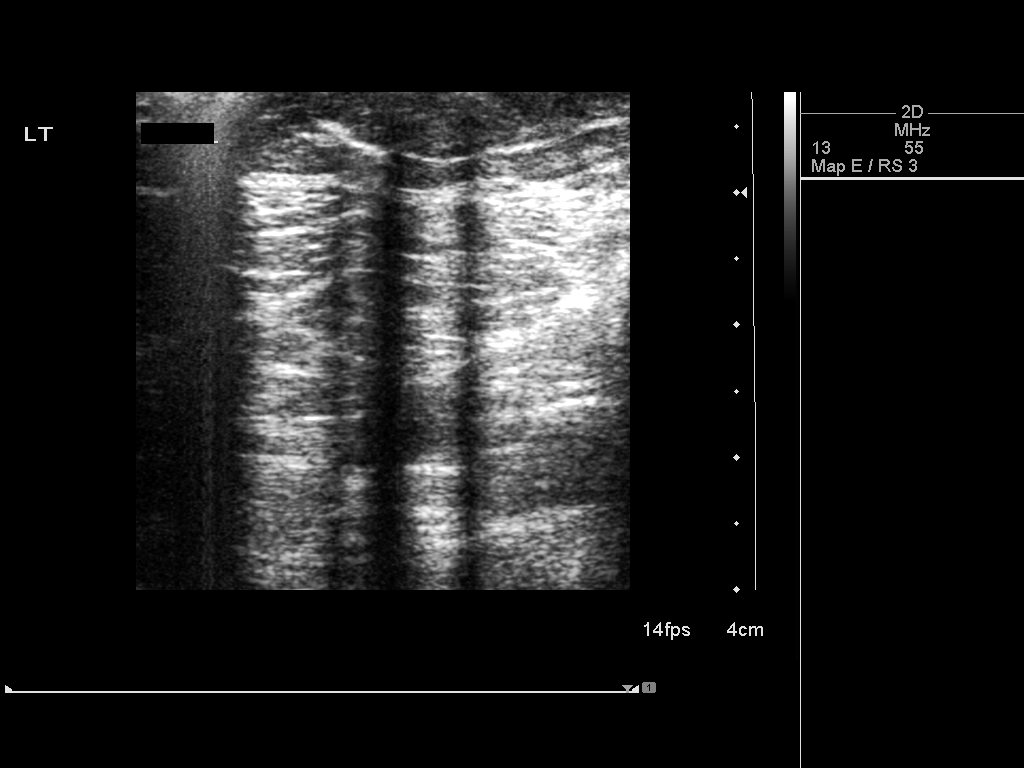

[2 of 2 positions shown; findings below may reference images not displayed]

EXAM:
Established Patient Outpatient Visit:   Level 2

HISTORY OF PRESENT ILLNESS:
47-year-old female who underwent left lower extremity calf region
ultrasound foam sclerotherapy 1 month ago. She also has already had
right lower extremity sclerotherapy. Over the last 4 weeks, she has
recovered very well. She has been compliant with compression
stockings and daily walking. No interval fevers or phlebitis. No
significant current leg pain, discomfort or fatigue. Overall she is
recovering very well.

CHIEF COMPLAINT:
Left lower extremity dilated reticular veins, status post
sclerotherapy

PHYSICAL EXAMINATION:
Left lower extremity popliteal varicose reticular veins are
thrombosed. Residual medial proximal calf small varicose reticular
veins remain patent and compressible. These are amenable to
injection which will be performed today. No other significant left
lower extremity varicose veins.

Right lower extremity injected sites are thrombosed. Residual
entrapped blood noted in the right mid thigh spider vein network.

IMAGING: Limited ultrasound again confirms medial proximal left calf
particular veins was remain patent.

ASSESSMENT AND PLAN:
Assessment: 1 month status post left lower extremity injection
sclerotherapy with residual medial calf dilated patent reticular
veins.

Plan: Plan for 2nd session of left lower extremity medial calf
ultrasound foam sclerotherapy today.

## 2014-01-05 ENCOUNTER — Encounter (HOSPITAL_COMMUNITY): Payer: Self-pay | Admitting: Pharmacist

## 2014-01-05 ENCOUNTER — Other Ambulatory Visit: Payer: Self-pay | Admitting: Obstetrics and Gynecology

## 2014-01-06 ENCOUNTER — Inpatient Hospital Stay (HOSPITAL_COMMUNITY): Admission: RE | Admit: 2014-01-06 | Payer: 59 | Source: Ambulatory Visit

## 2014-01-11 ENCOUNTER — Encounter (HOSPITAL_COMMUNITY)
Admission: RE | Admit: 2014-01-11 | Discharge: 2014-01-11 | Disposition: A | Discharge status: Home / Self Care | Payer: 59 | Source: Ambulatory Visit | Attending: Obstetrics and Gynecology | Admitting: Obstetrics and Gynecology

## 2014-01-11 ENCOUNTER — Encounter (HOSPITAL_COMMUNITY): Payer: Self-pay

## 2014-01-11 LAB — CBC
HCT: 41 % (ref 36.0–46.0)
Hemoglobin: 13.8 g/dL (ref 12.0–15.0)
MCH: 31.1 pg (ref 26.0–34.0)
MCHC: 33.7 g/dL (ref 30.0–36.0)
MCV: 92.3 fL (ref 78.0–100.0)
Platelets: 180 10*3/uL (ref 150–400)
RBC: 4.44 MIL/uL (ref 3.87–5.11)
RDW: 11.9 % (ref 11.5–15.5)
WBC: 6 10*3/uL (ref 4.0–10.5)

## 2014-01-11 NOTE — Pre-Procedure Instructions (Signed)
Pt health hx reviewed with Lyndle Herrlich MD, no additional orders given.

## 2014-01-11 NOTE — Patient Instructions (Signed)
[place]  01/11/2014   Your procedure is scheduled on:  01/13/14  Enter through the Main Entrance of [facility] at [place] up the phone at the desk and dial [phone].   Call this number if you have problems the morning of surgery: [phone]   Remember:   Do not eat food:After Midnight.  Do not drink clear liquids: After Midnight.  Take these medicines the morning of surgery with A SIP OF WATER: NA   Do not wear jewelry, make-up or nail polish.  Do not wear lotions, powders, or perfumes. You may wear deodorant.  Do not shave 48 hours prior to surgery.  Do not bring valuables to the hospital.  [facility] is not   responsible for any belongings or valuables brought to the hospital.  Contacts, dentures or bridgework may not be worn into surgery.  Leave suitcase in the car. After surgery it may be brought to your room.  For patients admitted to the hospital, checkout time is 11:00 AM the day of              discharge.   Patients discharged the day of surgery will not be allowed to drive             home.  Name and phone number of your driver: NA  Special Instructions:      Please read over the following fact sheets that you were given:   Surgical Site Infection Prevention

## 2014-01-13 ENCOUNTER — Ambulatory Visit (HOSPITAL_COMMUNITY): Payer: 59 | Admitting: Anesthesiology

## 2014-01-13 ENCOUNTER — Encounter (HOSPITAL_COMMUNITY)
Admission: RE | Disposition: A | Discharge status: Home / Self Care | Payer: Self-pay | Source: Ambulatory Visit | Attending: Obstetrics and Gynecology

## 2014-01-13 ENCOUNTER — Encounter (HOSPITAL_COMMUNITY): Payer: Self-pay | Admitting: Anesthesiology

## 2014-01-13 ENCOUNTER — Encounter (HOSPITAL_COMMUNITY): Payer: 59 | Admitting: Anesthesiology

## 2014-01-13 ENCOUNTER — Ambulatory Visit (HOSPITAL_COMMUNITY)
Admission: RE | Admit: 2014-01-13 | Discharge: 2014-01-13 | Disposition: A | Discharge status: Home / Self Care | Payer: 59 | Source: Ambulatory Visit | Attending: Obstetrics and Gynecology | Admitting: Obstetrics and Gynecology

## 2014-01-13 DIAGNOSIS — N840 Polyp of corpus uteri: Secondary | ICD-10-CM | POA: Insufficient documentation

## 2014-01-13 DIAGNOSIS — D696 Thrombocytopenia, unspecified: Secondary | ICD-10-CM | POA: Insufficient documentation

## 2014-01-13 DIAGNOSIS — N949 Unspecified condition associated with female genital organs and menstrual cycle: Secondary | ICD-10-CM | POA: Insufficient documentation

## 2014-01-13 DIAGNOSIS — I7300 Raynaud's syndrome without gangrene: Secondary | ICD-10-CM | POA: Insufficient documentation

## 2014-01-13 DIAGNOSIS — R5383 Other fatigue: Secondary | ICD-10-CM

## 2014-01-13 DIAGNOSIS — I82409 Acute embolism and thrombosis of unspecified deep veins of unspecified lower extremity: Secondary | ICD-10-CM | POA: Insufficient documentation

## 2014-01-13 DIAGNOSIS — N920 Excessive and frequent menstruation with regular cycle: Secondary | ICD-10-CM | POA: Insufficient documentation

## 2014-01-13 DIAGNOSIS — N938 Other specified abnormal uterine and vaginal bleeding: Secondary | ICD-10-CM | POA: Insufficient documentation

## 2014-01-13 DIAGNOSIS — M359 Systemic involvement of connective tissue, unspecified: Secondary | ICD-10-CM | POA: Insufficient documentation

## 2014-01-13 DIAGNOSIS — Z87891 Personal history of nicotine dependence: Secondary | ICD-10-CM | POA: Insufficient documentation

## 2014-01-13 DIAGNOSIS — R9389 Abnormal findings on diagnostic imaging of other specified body structures: Secondary | ICD-10-CM | POA: Insufficient documentation

## 2014-01-13 DIAGNOSIS — D6859 Other primary thrombophilia: Secondary | ICD-10-CM | POA: Insufficient documentation

## 2014-01-13 DIAGNOSIS — R5381 Other malaise: Secondary | ICD-10-CM | POA: Insufficient documentation

## 2014-01-13 HISTORY — PX: HYSTEROSCOPY WITH D & C: SHX1775

## 2014-01-13 SURGERY — DILATATION AND CURETTAGE /HYSTEROSCOPY
Anesthesia: General | Site: Uterus

## 2014-01-13 MED ORDER — MEPERIDINE HCL 25 MG/ML IJ SOLN: 6.2500 mg | INTRAMUSCULAR | Status: DC | PRN

## 2014-01-13 MED ORDER — KETOROLAC TROMETHAMINE 30 MG/ML IJ SOLN
15.0000 mg | Freq: Once | INTRAMUSCULAR | Status: AC | PRN
  Administered 2014-01-13: 30 mg via INTRAVENOUS

## 2014-01-13 MED ORDER — CEFAZOLIN SODIUM-DEXTROSE 2-3 GM-% IV SOLR
INTRAVENOUS | Status: AC
  Filled 2014-01-13: qty 50

## 2014-01-13 MED ORDER — LIDOCAINE HCL (CARDIAC) 20 MG/ML IV SOLN
INTRAVENOUS | Status: AC
  Filled 2014-01-13: qty 5

## 2014-01-13 MED ORDER — KETOROLAC TROMETHAMINE 30 MG/ML IJ SOLN
INTRAMUSCULAR | Status: AC
  Administered 2014-01-13: 30 mg via INTRAVENOUS
  Filled 2014-01-13: qty 1

## 2014-01-13 MED ORDER — METOCLOPRAMIDE HCL 5 MG/ML IJ SOLN: 10.0000 mg | Freq: Once | INTRAMUSCULAR | Status: DC | PRN

## 2014-01-13 MED ORDER — FENTANYL CITRATE 0.05 MG/ML IJ SOLN
INTRAMUSCULAR | Status: DC | PRN
  Administered 2014-01-13 (×3): 50 ug via INTRAVENOUS

## 2014-01-13 MED ORDER — PROPOFOL 10 MG/ML IV EMUL
INTRAVENOUS | Status: AC
  Filled 2014-01-13: qty 20

## 2014-01-13 MED ORDER — LIDOCAINE HCL (CARDIAC) 20 MG/ML IV SOLN
INTRAVENOUS | Status: DC | PRN
  Administered 2014-01-13: 140 mg via INTRAVENOUS

## 2014-01-13 MED ORDER — ONDANSETRON HCL 4 MG/2ML IJ SOLN
INTRAMUSCULAR | Status: AC
  Filled 2014-01-13: qty 2

## 2014-01-13 MED ORDER — FENTANYL CITRATE 0.05 MG/ML IJ SOLN
INTRAMUSCULAR | Status: AC
  Filled 2014-01-13: qty 5

## 2014-01-13 MED ORDER — FENTANYL CITRATE 0.05 MG/ML IJ SOLN
25.0000 ug | INTRAMUSCULAR | Status: DC | PRN
  Administered 2014-01-13: 50 ug via INTRAVENOUS

## 2014-01-13 MED ORDER — FENTANYL CITRATE 0.05 MG/ML IJ SOLN
INTRAMUSCULAR | Status: AC
  Administered 2014-01-13: 50 ug via INTRAVENOUS
  Filled 2014-01-13: qty 2

## 2014-01-13 MED ORDER — LIDOCAINE HCL 1 % IJ SOLN
INTRAMUSCULAR | Status: AC
  Filled 2014-01-13: qty 20

## 2014-01-13 MED ORDER — CEFAZOLIN SODIUM-DEXTROSE 2-3 GM-% IV SOLR
2.0000 g | Freq: Once | INTRAVENOUS | Status: AC
  Administered 2014-01-13: 2 g via INTRAVENOUS

## 2014-01-13 MED ORDER — LACTATED RINGERS IV SOLN
INTRAVENOUS | Status: DC
  Administered 2014-01-13 (×3): via INTRAVENOUS

## 2014-01-13 MED ORDER — DEXAMETHASONE SODIUM PHOSPHATE 10 MG/ML IJ SOLN
INTRAMUSCULAR | Status: AC
  Filled 2014-01-13: qty 1

## 2014-01-13 MED ORDER — MIDAZOLAM HCL 2 MG/2ML IJ SOLN
INTRAMUSCULAR | Status: AC
  Filled 2014-01-13: qty 2

## 2014-01-13 MED ORDER — LIDOCAINE HCL 1 % IJ SOLN
INTRAMUSCULAR | Status: DC | PRN
  Administered 2014-01-13: 10 mL

## 2014-01-13 MED ORDER — KETOROLAC TROMETHAMINE 30 MG/ML IJ SOLN
INTRAMUSCULAR | Status: AC
  Filled 2014-01-13: qty 1

## 2014-01-13 MED ORDER — GLYCINE 1.5 % IR SOLN
Status: DC | PRN
  Administered 2014-01-13: 3000 mL

## 2014-01-13 MED ORDER — MIDAZOLAM HCL 2 MG/2ML IJ SOLN
INTRAMUSCULAR | Status: DC | PRN
  Administered 2014-01-13: 1 mg via INTRAVENOUS

## 2014-01-13 SURGICAL SUPPLY — 21 items
CANISTER SUCT 3000ML (MISCELLANEOUS) ×3 IMPLANT
CATH SILICONE 16FRX5CC (CATHETERS) ×1 IMPLANT
CLOTH BEACON ORANGE TIMEOUT ST (SAFETY) ×2 IMPLANT
CONTAINER PREFILL 10% NBF 60ML (FORM) ×4 IMPLANT
DRAPE HYSTEROSCOPY (DRAPE) ×2 IMPLANT
DRSG TELFA 3X8 NADH (GAUZE/BANDAGES/DRESSINGS) ×2 IMPLANT
ELECT REM PT RETURN 9FT ADLT (ELECTROSURGICAL) ×2
ELECTRODE REM PT RTRN 9FT ADLT (ELECTROSURGICAL) ×1 IMPLANT
GLOVE SURG SS PI 7.0 STRL IVOR (GLOVE) ×10 IMPLANT
GOWN STRL REUS W/TWL LRG LVL3 (GOWN DISPOSABLE) ×6 IMPLANT
LOOP ANGLED CUTTING 22FR (CUTTING LOOP) ×1 IMPLANT
NDL SPNL 22GX3.5 QUINCKE BK (NEEDLE) ×1 IMPLANT
NEEDLE SPNL 22GX3.5 QUINCKE BK (NEEDLE) ×2 IMPLANT
PACK VAGINAL MINOR WOMEN LF (CUSTOM PROCEDURE TRAY) ×2 IMPLANT
PAD DRESSING TELFA 3X8 NADH (GAUZE/BANDAGES/DRESSINGS) ×1 IMPLANT
PAD OB MATERNITY 4.3X12.25 (PERSONAL CARE ITEMS) ×2 IMPLANT
SET TUBING HYSTEROSCOPY 2 NDL (TUBING) ×1 IMPLANT
SYR CONTROL 10ML LL (SYRINGE) ×2 IMPLANT
TOWEL OR 17X24 6PK STRL BLUE (TOWEL DISPOSABLE) ×5 IMPLANT
TUBE HYSTEROSCOPY W Y-CONNECT (TUBING) ×1 IMPLANT
WATER STERILE IRR 1000ML POUR (IV SOLUTION) ×2 IMPLANT

## 2014-01-13 NOTE — Discharge Instructions (Signed)
Dilation and Curettage or Vacuum Curettage, Care After Refer to this sheet in the next few weeks. These instructions provide you with information on caring for yourself after your procedure. Your health care provider may also give you more specific instructions. Your treatment has been planned according to current medical practices, but problems sometimes occur. Call your health care provider if you have any problems or questions after your procedure. WHAT TO EXPECT AFTER THE PROCEDURE After your procedure, it is typical to have light cramping and bleeding. This may last for 2 days to 2 weeks after the procedure. HOME CARE INSTRUCTIONS   Do not drive for 24 hours.  Wait 1 week before returning to strenuous activities.  Take your temperature 2 times a day for 4 days and write it down. Provide these temperatures to your health care provider if you develop a fever.  Avoid long periods of standing.  Avoid heavy lifting, pushing, or pulling. Do not lift anything heavier than 10 pounds (4.5 kg).  Limit stair climbing to once or twice a day.  Take rest periods often.  You may resume your usual diet.  Drink enough fluids to keep your urine clear or pale yellow.  Your usual bowel function should return. If you have constipation, you may:  Take a mild laxative with permission from your health care provider.  Add fruit and bran to your diet.  Drink more fluids.  Take showers instead of baths until your health care provider gives you permission to take baths.  Do not go swimming or use a hot tub until your health care provider approves.  Try to have someone with you or available to you the first 24 48 hours, especially if you were given a general anesthetic.  Do not douche, use tampons, or have intercourse for 2 weeks after the procedure.  Only take over-the-counter or prescription medicines as directed by your health care provider. Do not take aspirin. It can cause bleeding.  Follow up  with your health care provider as directed. SEEK MEDICAL CARE IF:   You have increasing cramps or pain that is not relieved with medicine.  You have abdominal pain that does not seem to be related to the same area of earlier cramping and pain.  You have bad smelling vaginal discharge.  You have a rash.  You are having problems with any medicine. SEEK IMMEDIATE MEDICAL CARE IF:   You have bleeding that is heavier than a normal menstrual period.  You have a fever.  You have chest pain.  You have shortness of breath.  You feel dizzy or feel like fainting.  You pass out.  You have pain in your shoulder strap area.  You have heavy vaginal bleeding with or without blood clots.  MAY TAKE IBUPROFEN, ADVIL, ALEVE OR MOTRIN AFTER 7 PM

## 2014-01-13 NOTE — Transfer of Care (Signed)
2Immediate Anesthesia Transfer of Care Note  Patient: Erica Wood  Procedure(s) Performed: Procedure(s): DILATATION AND CURETTAGE /HYSTEROSCOPY with resection  (N/A)  Patient Location: PACU  Anesthesia Type:General  Level of Consciousness: awake, alert  and oriented  Airway & Oxygen Therapy: Patient Spontanous Breathing and Patient connected to nasal cannula oxygen  Post-op Assessment: Report given to PACU RN and Post -op Vital signs reviewed and stable  Post vital signs: Reviewed and stable  Complications: No apparent anesthesia complications

## 2014-01-13 NOTE — Anesthesia Preprocedure Evaluation (Signed)
Anesthesia Evaluation  Patient identified by MRN, date of birth, ID band Patient awake    Reviewed: Allergy & Precautions, H&P , NPO status , Patient's Chart, lab work & pertinent test results, reviewed documented beta blocker date and time   History of Anesthesia Complications Negative for: history of anesthetic complications  Airway Mallampati: I TM Distance: >3 FB Neck ROM: full    Dental  (+) Teeth Intact, Implants   Pulmonary former smoker, PE (2003) breath sounds clear to auscultation  Pulmonary exam normal       Cardiovascular negative cardio ROS  Rhythm:regular Rate:Normal     Neuro/Psych Anxiety negative neurological ROS     GI/Hepatic negative GI ROS, Neg liver ROS,   Endo/Other  negative endocrine ROS  Renal/GU negative Renal ROS  Female GU complaint     Musculoskeletal   Abdominal   Peds  Hematology negative hematology ROS (+)   Anesthesia Other Findings   Reproductive/Obstetrics negative OB ROS                           Anesthesia Physical Anesthesia Plan  ASA: II  Anesthesia Plan: General LMA   Post-op Pain Management:    Induction:   Airway Management Planned:   Additional Equipment:   Intra-op Plan:   Post-operative Plan:   Informed Consent: I have reviewed the patients History and Physical, chart, labs and discussed the procedure including the risks, benefits and alternatives for the proposed anesthesia with the patient or authorized representative who has indicated his/her understanding and acceptance.   Dental Advisory Given  Plan Discussed with: CRNA and Surgeon  Anesthesia Plan Comments:         Anesthesia Quick Evaluation

## 2014-01-13 NOTE — H&P (Addendum)
Pt is a 48 yr old white female who presents to the OR for a Hysteroscopy/D&C secondary to persistent breakthrough bleeding and heavy menses. She also had a thickened endometrium. She is suspected to have a polyp   Chief Complaint: HPI:  Past Medical History  Diagnosis Date  . Autoimmune disease   . Anti-cardiolipin antibody positive   . Anti-cardiolipin antibody syndrome   . Arthralgia   . Thrombocytopenia   . Anxiety   . Chronic fatigue   . Sicca   . Atypical chest pain   . DVT (deep venous thrombosis)   . Pulmonary embolus   . Raynaud's syndrome     Past Surgical History  Procedure Laterality Date  . Appendectomy    . Laparotomy    . Colon surgery      Perforated colon repair after colostomy    No family history on file. Social History:  reports that she quit smoking about 2 years ago. Her smoking use included Cigarettes. She started smoking about 13 years ago. She has a 5.5 pack-year smoking history. She has never used smokeless tobacco. She reports that she drinks alcohol. She reports that she does not use illicit drugs.  Allergies:  Allergies  Allergen Reactions  . Beef-Derived Products   . Latex Rash  . Sulfa Antibiotics Rash    Medications Prior to Admission  Medication Sig Dispense Refill  . amphetamine-dextroamphetamine (ADDERALL) 20 MG tablet Take 20 mg by mouth daily.      . cholecalciferol (VITAMIN D) 1000 UNITS tablet Take 1,000 Units by mouth daily.      . hydroxychloroquine (PLAQUENIL) 200 MG tablet Take 200 mg by mouth daily.      . norethindrone (MICRONOR,CAMILA,ERRIN) 0.35 MG tablet Take 1 tablet by mouth daily.           Blood pressure 107/64, pulse 66, temperature 98.2 F (36.8 C), temperature source Oral, resp. rate 20, SpO2 100.00%. BP 107/64  Pulse 66  Temp(Src) 98.2 F (36.8 C) (Oral)  Resp 20  SpO2 100% General appearance: alert Lungs: clear to auscultation bilaterally Abdomen: soft, non-tender; bowel sounds normal; no masses,  no  organomegaly   Lab Results  Component Value Date   WBC 6.0 01/11/2014   HGB 13.8 01/11/2014   HCT 41.0 01/11/2014   MCV 92.3 01/11/2014   PLT 180 01/11/2014   No results found for this basename: PREGTESTUR, PREGSERUM, HCG, HCGQUANT     Assessment/Plan Proceed with Ablation  There are no active problems to display for this patient.  Menorrhagia, thickened endometrium Proceed with hysteroscopy, D&C Derrika Ruffalo E 01/13/2014, 11:25 AM.

## 2014-01-14 ENCOUNTER — Encounter (HOSPITAL_COMMUNITY): Payer: Self-pay | Admitting: Obstetrics and Gynecology

## 2014-01-14 NOTE — Anesthesia Postprocedure Evaluation (Signed)
  Anesthesia Post-op Note  Anesthesia Post Note  Patient: Erica Wood  Procedure(s) Performed: Procedure(s) (LRB): DILATATION AND CURETTAGE /HYSTEROSCOPY with resection  (N/A)  Anesthesia type: General  Patient location: PACU  Post pain: Pain level controlled  Post assessment: Post-op Vital signs reviewed  Post vital signs: Reviewed  Level of consciousness: sedated  Complications: No apparent anesthesia complications

## 2014-01-14 NOTE — Op Note (Signed)
Erica Wood, Erica Wood                ACCOUNT NO.:  [id number]  MEDICAL RECORD NO.:  [id number]  LOCATION:  [facility]                          FACILITY:  [facility]  PHYSICIAN:  Freda Munro, M.D.    DATE OF BIRTH:  12-23-1965  DATE OF PROCEDURE: DATE OF DISCHARGE:  01/13/2014                              OPERATIVE REPORT   PREOPERATIVE DIAGNOSIS:  Endometrial polyp.  POSTOPERATIVE DIAGNOSIS:  Endometrial polyp.  PROCEDURE: 1. Hysteroscopy. 2. Resection of endometrial polyps. 3. Resection of endometrial cavity.  SURGEON:  Freda Munro, MD.  ANESTHESIA:  General with local.  ANTIBIOTICS:  Ancef 2 g.  DRAINS:  Red rubber catheter to bladder.  SPECIMENS:  Endometrial polyps sent to pathology.  PROCEDURE IN DETAIL:  The patient was taken to the operating room.  She was placed in dorsal supine position.  A general anesthetic was administered without difficulty.  She was then placed in dorsal lithotomy position.  She was prepped and draped in usual fashion for this procedure.  An exam under anesthesia revealed anteverted uterus of normal size and shape.  A sterile speculum was placed in the vagina.  A 10 mL of 1% lidocaine was used for paracervical block.  The cervix was serially dilated to a 21-French.  The hysteroscope was advanced into the uterine cavity.  Endocervical canal appeared to be normal.  On entering the uterine cavity, both ostia were visualized.  There was a 1-2 cm polyp arising from the patient's left corneal area and also a smaller polyp near the right ostia.  At this point, the scope was removed.  The patient was dilated to a 25-French and a resectoscope set up with a single wand. The polyp was resected along with the majority of the endometrial cavity.  All tissue was sent to pathology.  This concluded the procedure.  No significant bleeding was noted.  The patient was discharged to home.  She was sent home with Advil to take p.r.n.  She will follow up in the office in  4 weeks.          ______________________________ Freda Munro, M.D.     MA/MEDQ  D:  01/13/2014  T:  01/14/2014  Job:  [id number]

## 2014-01-18 NOTE — Addendum Note (Signed)
Addendum created 01/18/14 1204 by Assunta Gambles, MD   Modules edited: Anesthesia Attestations

## 2014-02-21 ENCOUNTER — Other Ambulatory Visit: Payer: Self-pay | Admitting: *Deleted

## 2014-02-21 DIAGNOSIS — I83893 Varicose veins of bilateral lower extremities with other complications: Secondary | ICD-10-CM

## 2014-04-26 ENCOUNTER — Encounter: Payer: 59 | Admitting: Vascular Surgery

## 2014-04-26 ENCOUNTER — Encounter (HOSPITAL_COMMUNITY): Payer: 59

## 2014-05-23 ENCOUNTER — Encounter: Payer: Self-pay | Admitting: Vascular Surgery

## 2014-05-24 ENCOUNTER — Encounter: Payer: Self-pay | Admitting: Vascular Surgery

## 2014-05-24 ENCOUNTER — Ambulatory Visit (INDEPENDENT_AMBULATORY_CARE_PROVIDER_SITE_OTHER): Payer: 59 | Admitting: Vascular Surgery

## 2014-05-24 ENCOUNTER — Encounter (INDEPENDENT_AMBULATORY_CARE_PROVIDER_SITE_OTHER): Payer: Self-pay

## 2014-05-24 ENCOUNTER — Ambulatory Visit (HOSPITAL_COMMUNITY)
Admission: RE | Admit: 2014-05-24 | Discharge: 2014-05-24 | Disposition: A | Discharge status: Home / Self Care | Payer: 59 | Source: Ambulatory Visit | Attending: Vascular Surgery | Admitting: Vascular Surgery

## 2014-05-24 VITALS — BP 116/70 | HR 66 | Resp 16 | Ht 64.0 in | Wt 110.0 lb

## 2014-05-24 DIAGNOSIS — I8390 Asymptomatic varicose veins of unspecified lower extremity: Secondary | ICD-10-CM

## 2014-05-24 DIAGNOSIS — I83893 Varicose veins of bilateral lower extremities with other complications: Secondary | ICD-10-CM

## 2014-05-24 DIAGNOSIS — I8393 Asymptomatic varicose veins of bilateral lower extremities: Secondary | ICD-10-CM

## 2014-05-24 NOTE — Progress Notes (Signed)
Subjective:     Patient ID: Erica Wood, female   DOB: 07/12/1966, 48 y.o.   MRN: [id number]  HPI this 48 year old female is evaluated for painful spidering varicose veins in both lower extremities. She has a history of DVT she says in both legs but worse on the left followed by a pulmonary embolus many years ago. She was found to have anti-phospholipid syndrome. She is on Coumadin therapy. She had sclerotherapy performed by [facility] radiology in January of this year. This was done by Dr. Noelle Penner. She has some darkness in these areas worthy treatment was performed. She has continued to have discomfort in the legs and swelling in the left ankle.  Past Medical History  Diagnosis Date  . Autoimmune disease   . Anti-cardiolipin antibody positive   . Anti-cardiolipin antibody syndrome   . Arthralgia   . Thrombocytopenia   . Anxiety   . Chronic fatigue   . Sicca   . Atypical chest pain   . DVT (deep venous thrombosis)   . Pulmonary embolus   . Raynaud's syndrome     History  Substance Use Topics  . Smoking status: Former Smoker -- 0.50 packs/day for 11 years    Types: Cigarettes    Start date: 08/06/2000    Quit date: 08/07/2011  . Smokeless tobacco: Never Used  . Alcohol Use: Yes     Comment: occasionally    No family history on file.  Allergies  Allergen Reactions  . Beef-Derived Products   . Latex Rash  . Sulfa Antibiotics Rash    Current outpatient prescriptions:amphetamine-dextroamphetamine (ADDERALL) 20 MG tablet, Take 20 mg by mouth daily., Disp: , Rfl: ;  cholecalciferol (VITAMIN D) 1000 UNITS tablet, Take 1,000 Units by mouth daily., Disp: , Rfl: ;  hydroxychloroquine (PLAQUENIL) 200 MG tablet, Take 200 mg by mouth daily., Disp: , Rfl:   BP 116/70  Pulse 66  Resp 16  Ht 5\' 4"  (1.626 m)  Wt 110 lb (49.896 kg)  BMI 18.87 kg/m2  Body mass index is 18.87 kg/(m^2).           Review of Systems denies chest pain, dyspnea on exertion, PND, orthopnea,  hemoptysis,. Does complain of some leg pain with walking, history of DVT in the left leg and less so in the right leg, chronic edema left worse than right. Other systems negative and complete review of systems other than her previous pulmonary embolus from anti  phospholipid syndrome     Objective:   Physical Exam BP 116/70  Pulse 66  Resp 16  Ht 5\' 4"  (1.626 m)  Wt 110 lb (49.896 kg)  BMI 18.87 kg/m2  Gen.-alert and oriented x3 in no apparent distress HEENT normal for age Lungs no rhonchi or wheezing Cardiovascular regular rhythm no murmurs carotid pulses 3+ palpable no bruits audible Abdomen soft nontender no palpable masses Musculoskeletal free of  major deformities Skin clear -no rashes Neurologic normal Lower extremities 3+ femoral and dorsalis pedis pulses palpable bilaterally with no edema on the right 1+ edema on left.  there are spider veins both anterior thighs medially. There is evidence of previous sclerotherapy and left leg posteriorly with significant staining along the distal posterior thigh and the popliteal fossa. Also some staining on the right anterior medial thigh. No hyperpigmentation or ulceration noted.  Today I ordered bilateral venous duplex exams are reviewed and interpreted. The right leg reveals no reflux and the the deep or superficial systems. The left leg has deep venous reflux  throughout and the great saphenous system is a duplicate system with no significant reflux except focally below the knee on the left.        Assessment:     Bilateral reticular and small varicose veins-spider veins causing aching and throbbing Previous sclerotherapy at another institution with significant staining History of antiphospholipid syndrome with previous left DVT and pulmonary embolus on chronic Coumadin therapy    Plan:     There is no indication for any laser ablation or stab phlebectomy treatment in this lady Only potential therapy would be sclerotherapy of some  of the varicosities and reticular veins which were not treated previously at [facility] radiology Potential complications such as staining or incomplete closure were discussed with her She will discuss this further with Kathlee Nations  and determine if she would like to undergo further sclerotherapy

## 2014-12-27 ENCOUNTER — Other Ambulatory Visit: Payer: Self-pay | Admitting: Obstetrics and Gynecology

## 2014-12-27 DIAGNOSIS — Z1231 Encounter for screening mammogram for malignant neoplasm of breast: Secondary | ICD-10-CM

## 2014-12-29 ENCOUNTER — Ambulatory Visit
Admission: RE | Admit: 2014-12-29 | Discharge: 2014-12-29 | Disposition: A | Discharge status: Home / Self Care | Payer: 59 | Source: Ambulatory Visit | Attending: Obstetrics and Gynecology | Admitting: Obstetrics and Gynecology

## 2014-12-29 DIAGNOSIS — Z1231 Encounter for screening mammogram for malignant neoplasm of breast: Secondary | ICD-10-CM

## 2014-12-29 IMAGING — MG MM SCREENING BREAST TOMO BILATERAL
8 series · 9 of 24 positions shown · non-contrast
Comparison: Previous exam(s).

CLINICAL DATA: Screening.

EXAM:
DIGITAL SCREENING BILATERAL MAMMOGRAM WITH 3D TOMO WITH CAD

[L MLO]
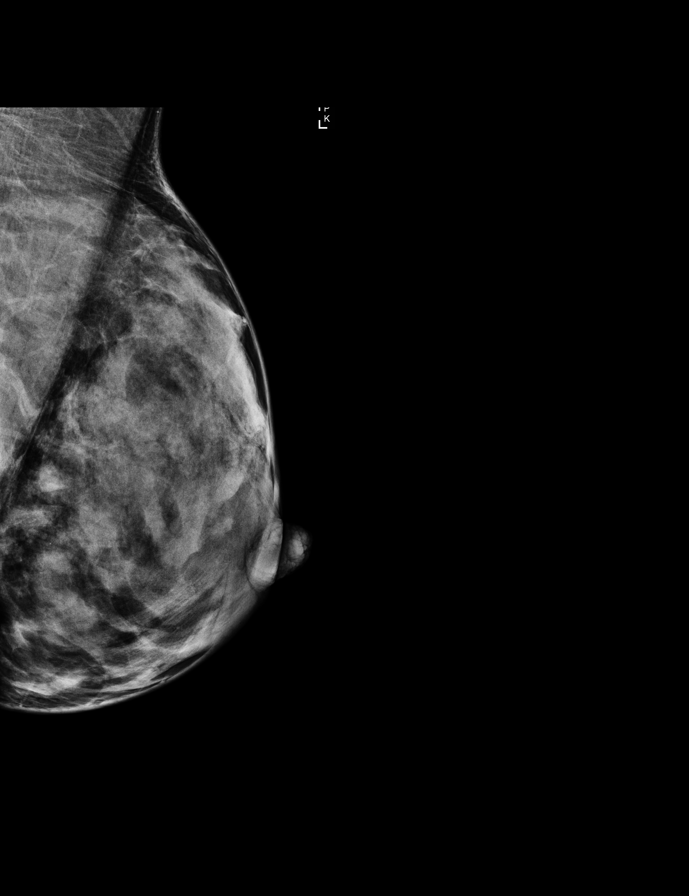

[L CC]
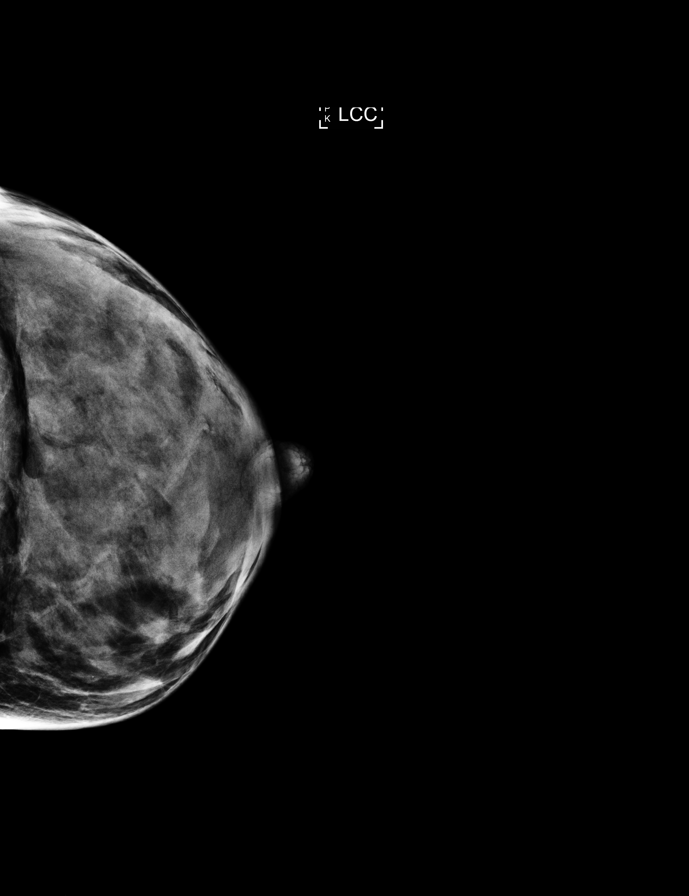

[R MLO]
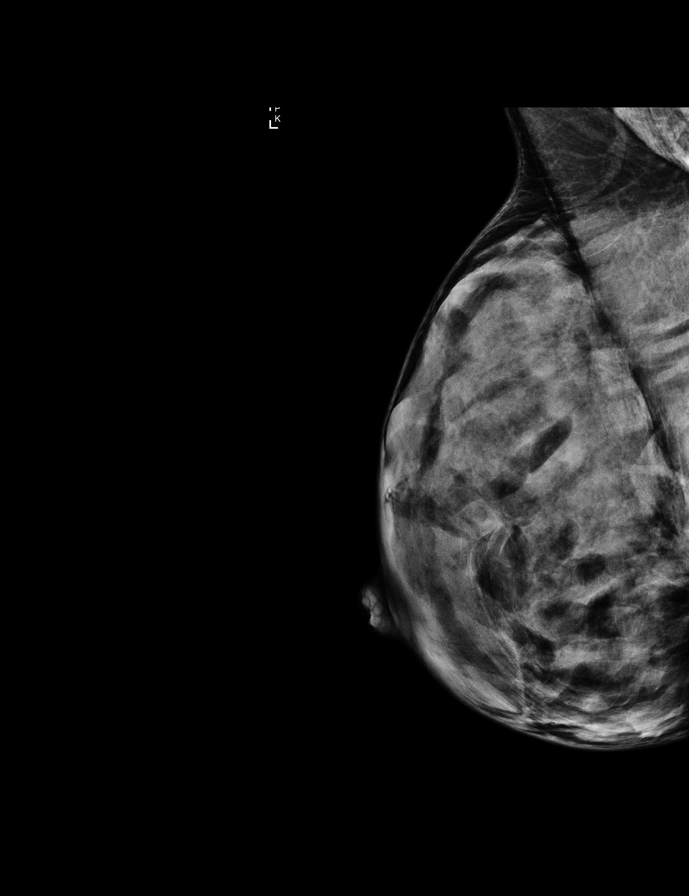

[R CC]
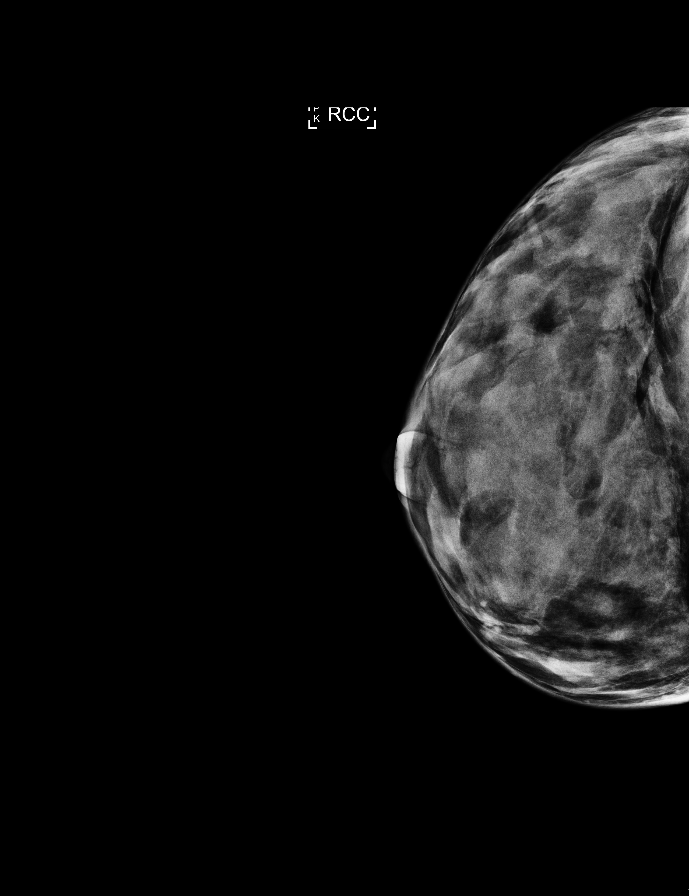

[L MLO tomo · 2 of 45 frames shown]
[frame 15/45]
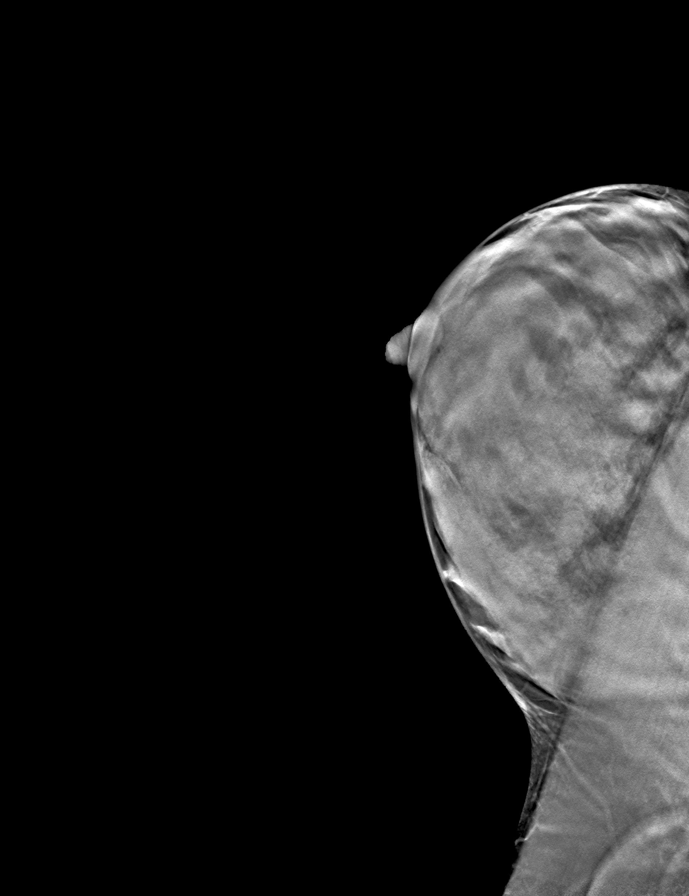
[frame 23/45]
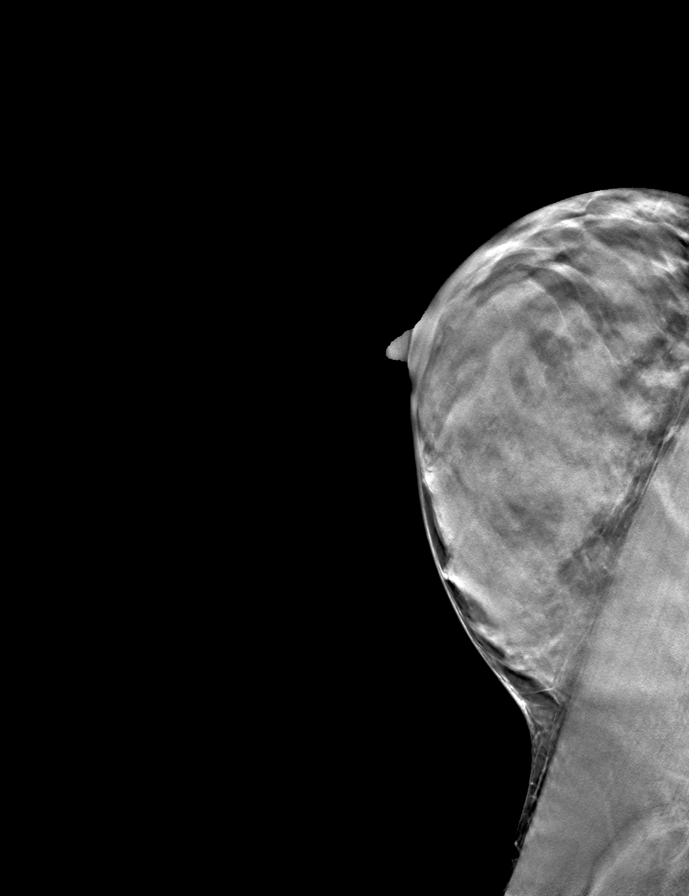

[R CC tomo · tomo slice 25/50.0]
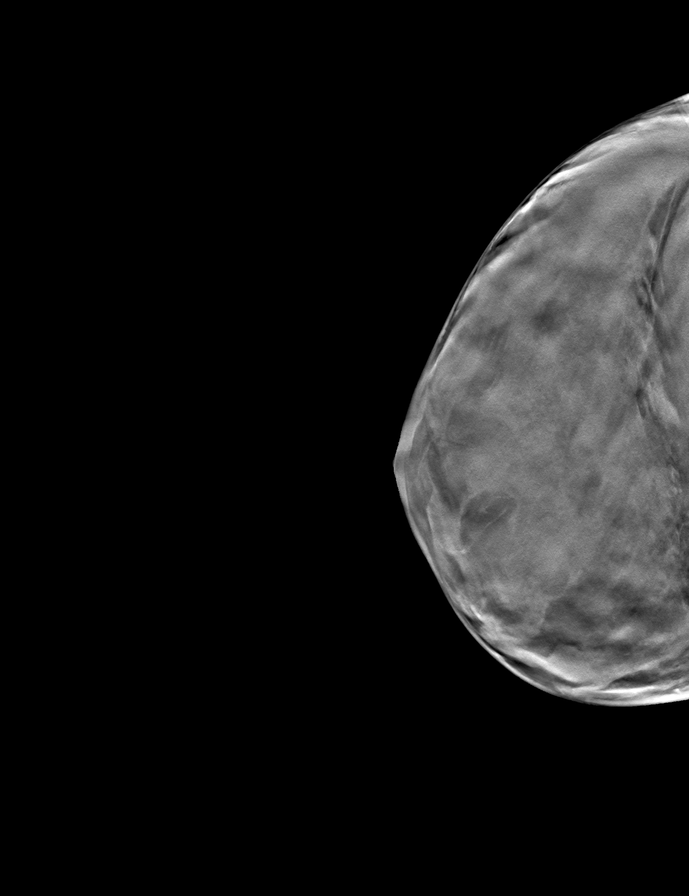

[L CC tomo · tomo slice 23/44.0]
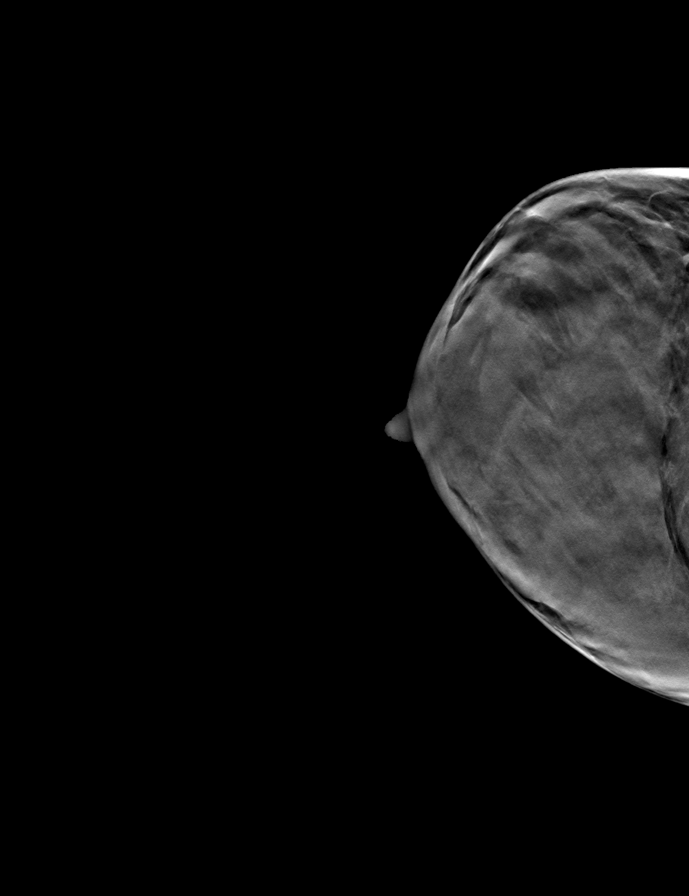

[R MLO tomo · tomo slice 25/49.0]
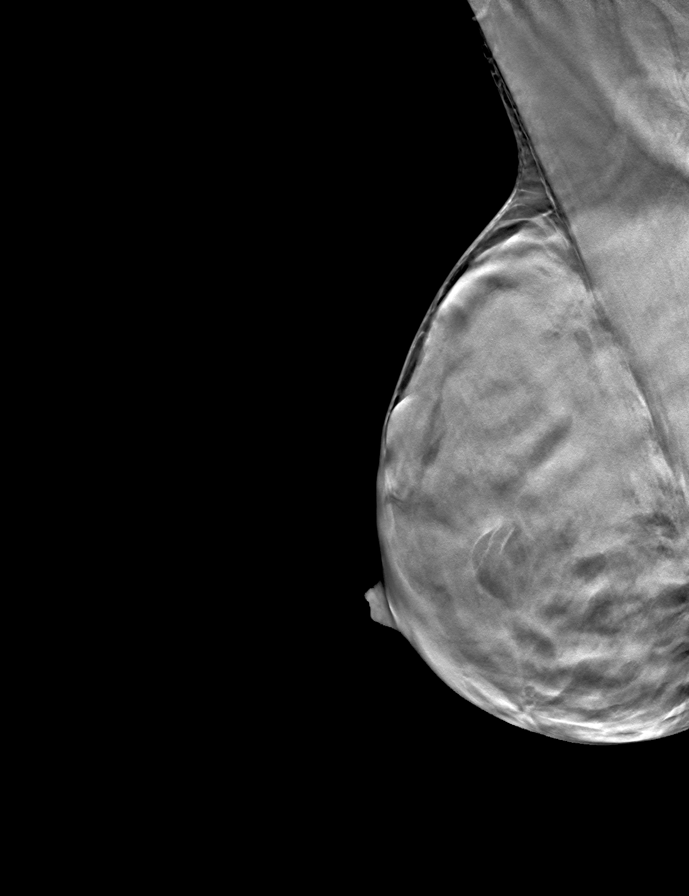

[9 of 24 positions shown; findings below may reference images not displayed]

ACR Breast Density Category d: The breast tissue is extremely dense,
which lowers the sensitivity of mammography.
FINDINGS: There are no findings suspicious for malignancy. Images were
processed with CAD.
IMPRESSION: No mammographic evidence of malignancy. A result letter of this
screening mammogram will be mailed directly to the patient.

RECOMMENDATION:
Screening mammogram in one year. (Code:[8S])

BI-RADS CATEGORY  1: Negative.

## 2015-01-03 ENCOUNTER — Encounter: Payer: Self-pay | Admitting: *Deleted

## 2015-01-04 ENCOUNTER — Ambulatory Visit (INDEPENDENT_AMBULATORY_CARE_PROVIDER_SITE_OTHER): Payer: 59 | Admitting: *Deleted

## 2015-01-04 DIAGNOSIS — I8393 Asymptomatic varicose veins of bilateral lower extremities: Secondary | ICD-10-CM

## 2015-01-04 NOTE — Progress Notes (Signed)
X=.4% Sotradecol administered with a 27g butterfly.  Patient received a total of 6cc.  This nice lady has lots of staining from sclero done a year ago at [facility]. We decide to do one syringe on the front of her right leg to see if we could scheive better results. Easy access. Hoping for good results. Warned her about sun exposure, etc. Will follow prn.  Photos: No.  Compression stockings applied: Yes.

## 2015-02-14 ENCOUNTER — Encounter: Payer: Self-pay | Admitting: *Deleted

## 2015-02-15 ENCOUNTER — Ambulatory Visit: Payer: 59 | Admitting: *Deleted

## 2016-01-03 DIAGNOSIS — I7300 Raynaud's syndrome without gangrene: Secondary | ICD-10-CM | POA: Insufficient documentation

## 2016-08-27 ENCOUNTER — Ambulatory Visit (INDEPENDENT_AMBULATORY_CARE_PROVIDER_SITE_OTHER): Payer: 59 | Admitting: Rheumatology

## 2016-08-27 DIAGNOSIS — M545 Low back pain: Secondary | ICD-10-CM

## 2016-08-27 DIAGNOSIS — M79641 Pain in right hand: Secondary | ICD-10-CM

## 2016-08-27 DIAGNOSIS — Z79899 Other long term (current) drug therapy: Secondary | ICD-10-CM | POA: Diagnosis not present

## 2016-08-27 DIAGNOSIS — M170 Bilateral primary osteoarthritis of knee: Secondary | ICD-10-CM

## 2016-09-12 ENCOUNTER — Telehealth: Payer: Self-pay | Admitting: Rheumatology

## 2016-09-12 NOTE — Telephone Encounter (Signed)
Patient needs a refill of Adderrall

## 2016-09-13 MED ORDER — AMPHETAMINE-DEXTROAMPHET ER 20 MG PO CP24: 20.0000 mg | ORAL_CAPSULE | Freq: Every day | ORAL | 0 refills | Status: DC

## 2016-09-13 NOTE — Telephone Encounter (Signed)
Last visit 08/27/16 Next visit 11/27/16  Ok to refill Adderall XR 20mg  once per day ? Last rx 08/15/16

## 2016-09-13 NOTE — Telephone Encounter (Signed)
Ok to refill Adderall XR 20mg  once per day

## 2016-09-16 NOTE — Telephone Encounter (Signed)
Called pt to advise ready for pick up

## 2016-10-15 ENCOUNTER — Other Ambulatory Visit: Payer: Self-pay | Admitting: Rheumatology

## 2016-10-15 NOTE — Telephone Encounter (Signed)
Patient is requesting refill of adderall.  

## 2016-10-16 NOTE — Telephone Encounter (Signed)
ok 

## 2016-10-16 NOTE — Telephone Encounter (Signed)
Last Visit: 08/27/16 Next Visit: 11/27/16 Labs: 03/28/16 WNL  Okay to refill Adderall?

## 2016-10-17 MED ORDER — AMPHETAMINE-DEXTROAMPHET ER 20 MG PO CP24: 20.0000 mg | ORAL_CAPSULE | Freq: Every day | ORAL | 0 refills | Status: DC

## 2016-10-17 NOTE — Telephone Encounter (Signed)
ok 

## 2016-11-13 ENCOUNTER — Other Ambulatory Visit: Payer: Self-pay | Admitting: Rheumatology

## 2016-11-13 NOTE — Telephone Encounter (Signed)
Patient is requesting refill of adderall.  

## 2016-11-14 MED ORDER — AMPHETAMINE-DEXTROAMPHET ER 20 MG PO CP24: 20.0000 mg | ORAL_CAPSULE | Freq: Every day | ORAL | 0 refills | Status: DC

## 2016-11-14 NOTE — Telephone Encounter (Signed)
ok 

## 2016-11-14 NOTE — Telephone Encounter (Signed)
Last visit 08/27/16 Next visit 11/27/16  Ok to refill Adderall XR 20mg  once per day ? Last prescription 10/17/16

## 2016-11-22 DIAGNOSIS — H25043 Posterior subcapsular polar age-related cataract, bilateral: Secondary | ICD-10-CM | POA: Diagnosis not present

## 2016-11-22 DIAGNOSIS — Z79899 Other long term (current) drug therapy: Secondary | ICD-10-CM | POA: Diagnosis not present

## 2016-11-22 DIAGNOSIS — H16101 Unspecified superficial keratitis, right eye: Secondary | ICD-10-CM | POA: Diagnosis not present

## 2016-11-22 DIAGNOSIS — H524 Presbyopia: Secondary | ICD-10-CM | POA: Diagnosis not present

## 2016-11-25 DIAGNOSIS — M19041 Primary osteoarthritis, right hand: Secondary | ICD-10-CM | POA: Insufficient documentation

## 2016-11-25 DIAGNOSIS — Z79899 Other long term (current) drug therapy: Secondary | ICD-10-CM | POA: Insufficient documentation

## 2016-11-25 DIAGNOSIS — M359 Systemic involvement of connective tissue, unspecified: Secondary | ICD-10-CM

## 2016-11-25 DIAGNOSIS — K581 Irritable bowel syndrome with constipation: Secondary | ICD-10-CM | POA: Insufficient documentation

## 2016-11-25 DIAGNOSIS — M19042 Primary osteoarthritis, left hand: Secondary | ICD-10-CM | POA: Insufficient documentation

## 2016-11-25 DIAGNOSIS — F5101 Primary insomnia: Secondary | ICD-10-CM | POA: Insufficient documentation

## 2016-11-25 DIAGNOSIS — F419 Anxiety disorder, unspecified: Secondary | ICD-10-CM | POA: Insufficient documentation

## 2016-11-25 DIAGNOSIS — Z86711 Personal history of pulmonary embolism: Secondary | ICD-10-CM | POA: Insufficient documentation

## 2016-11-25 DIAGNOSIS — Z86718 Personal history of other venous thrombosis and embolism: Secondary | ICD-10-CM | POA: Insufficient documentation

## 2016-11-25 HISTORY — DX: Systemic involvement of connective tissue, unspecified: M35.9

## 2016-11-25 NOTE — Progress Notes (Deleted)
   Office Visit Note  Patient: Erica Wood             Date of Birth: Dec 15, 1965           MRN: [id number]             PCP: Precious Reel, MD Referring: Shon Baton, MD Visit Date: 11/27/2016 Occupation: @GUAROCC @    Subjective:  No chief complaint on file.   History of Present Illness: Erica Wood is a 51 y.o. female ***   Activities of Daily Living:  Patient reports morning stiffness for *** {minute/hour:19697}.   Patient {ACTIONS;DENIES/REPORTS:21021675::"Denies"} nocturnal pain.  Difficulty dressing/grooming: {ACTIONS;DENIES/REPORTS:21021675::"Denies"} Difficulty climbing stairs: {ACTIONS;DENIES/REPORTS:21021675::"Denies"} Difficulty getting out of chair: {ACTIONS;DENIES/REPORTS:21021675::"Denies"} Difficulty using hands for taps, buttons, cutlery, and/or writing: {ACTIONS;DENIES/REPORTS:21021675::"Denies"}   No Rheumatology ROS completed.   PMFS History:  Patient Active Problem List   Diagnosis Date Noted  . Varicose veins of lower extremities with other complications [id number]  . Spider veins of both lower extremities 05/24/2014    Past Medical History:  Diagnosis Date  . Anti-cardiolipin antibody positive   . Anti-cardiolipin antibody syndrome   . Anxiety   . Arthralgia   . Atypical chest pain   . Autoimmune disease   . Chronic fatigue   . DVT (deep venous thrombosis)   . Pulmonary embolus   . Raynaud's syndrome   . Sicca   . Thrombocytopenia     No family history on file. Past Surgical History:  Procedure Laterality Date  . APPENDECTOMY    . COLON SURGERY     Perforated colon repair after colostomy  . HYSTEROSCOPY W/D&C N/A 01/13/2014   Procedure: DILATATION AND CURETTAGE /HYSTEROSCOPY with resection ;  Surgeon: Olga Millers, MD;  Location: [facility];  Service: Gynecology;  Laterality: N/A;  . LAPAROTOMY     Social History   Social History Narrative  . No narrative on file     Objective: Vital Signs: There were no vitals taken for this  visit.   Physical Exam   Musculoskeletal Exam: ***  CDAI Exam: No CDAI exam completed.    Investigation: Findings:  03/26/2016 CBC normal, CMP normal, UA negative, January 2017 C3-C4 normal double-stranded DNA negative ANA positive, Ro positive    Imaging: No results found.  Speciality Comments: No specialty comments available.    Procedures:  No procedures performed Allergies: Beef-derived products; Latex; and Sulfa antibiotics   Assessment / Plan:     Visit Diagnoses: Autoimmune disease ([place]) - History of positive ANA, positive Ro, positive anticardiolipin, positive DS(negative now), history of discoid, Raynauds, arthritis  High risk medication use - Plaquenil 200 mg 1 AM, half p.m.  Primary osteoarthritis of both hands  History of DVT (deep vein thrombosis)  History of pulmonary embolism - On Xeralto  Primary insomnia  Anxiety  Irritable bowel syndrome with constipation    Orders: No orders of the defined types were placed in this encounter.  No orders of the defined types were placed in this encounter.   Face-to-face time spent with patient was *** minutes. 50% of time was spent in counseling and coordination of care.  Follow-Up Instructions: No Follow-up on file.   Bo Merino, MD  Note - This record has been created using Editor, commissioning.  Chart creation errors have been sought, but may not always  have been located. Such creation errors do not reflect on  the standard of medical care.

## 2016-11-26 DIAGNOSIS — L930 Discoid lupus erythematosus: Secondary | ICD-10-CM | POA: Insufficient documentation

## 2016-11-26 DIAGNOSIS — I7300 Raynaud's syndrome without gangrene: Secondary | ICD-10-CM | POA: Insufficient documentation

## 2016-11-26 DIAGNOSIS — R760 Raised antibody titer: Secondary | ICD-10-CM | POA: Insufficient documentation

## 2016-11-26 NOTE — Progress Notes (Deleted)
Office Visit Note  Patient: Erica Wood             Date of Birth: 07-19-1966           MRN: [id number]             PCP: Precious Reel, MD Referring: Shon Baton, MD Visit Date: 11/27/2016 Occupation: @GUAROCC @    Subjective:  No chief complaint on file.   History of Present Illness: Erica Wood is a 51 y.o. female ***   Activities of Daily Living:  Patient reports morning stiffness for *** {minute/hour:19697}.   Patient {ACTIONS;DENIES/REPORTS:21021675::"Denies"} nocturnal pain.  Difficulty dressing/grooming: {ACTIONS;DENIES/REPORTS:21021675::"Denies"} Difficulty climbing stairs: {ACTIONS;DENIES/REPORTS:21021675::"Denies"} Difficulty getting out of chair: {ACTIONS;DENIES/REPORTS:21021675::"Denies"} Difficulty using hands for taps, buttons, cutlery, and/or writing: {ACTIONS;DENIES/REPORTS:21021675::"Denies"}   No Rheumatology ROS completed.   PMFS History:  Patient Active Problem List   Diagnosis Date Noted  . Raynaud's disease without gangrene 11/26/2016  . 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 Anticardiolipin antibody positive 11/26/2016  . Autoimmune disease ([place]) 11/25/2016  . High risk medication use 11/25/2016  . Primary osteoarthritis of both hands 11/25/2016  . History of DVT (deep vein thrombosis) 11/25/2016  . History of pulmonary embolism 11/25/2016  . Primary insomnia 11/25/2016  . Anxiety 11/25/2016  . Irritable bowel syndrome with constipation 11/25/2016  . Varicose veins of lower extremities with other complications [id number]  . Spider veins of both lower extremities 05/24/2014    Past Medical History:  Diagnosis Date  . Anti-cardiolipin antibody positive   . Anti-cardiolipin antibody syndrome   . Anxiety   . Arthralgia   . Atypical chest pain   . Autoimmune disease   . Chronic fatigue   . DVT (deep venous thrombosis)   . Pulmonary embolus   . Raynaud's syndrome   . Sicca   . Thrombocytopenia     No family history on file. Past Surgical  History:  Procedure Laterality Date  . APPENDECTOMY    . COLON SURGERY     Perforated colon repair after colostomy  . HYSTEROSCOPY W/D&C N/A 01/13/2014   Procedure: DILATATION AND CURETTAGE /HYSTEROSCOPY with resection ;  Surgeon: Olga Millers, MD;  Location: [facility];  Service: Gynecology;  Laterality: N/A;  . LAPAROTOMY     Social History   Social History Narrative  . No narrative on file     Objective: Vital Signs: There were no vitals taken for this visit.   Physical Exam   Musculoskeletal Exam: ***  CDAI Exam: No CDAI exam completed.    Investigation: Findings:  Labs from May 01, 2009, urinalysis was normal.  Anti-Cardiolipin IgA was slightly elevated.  ANA was positive, no titer was given.  RO antibody and anti-Cardiolipin IgM were the only antibodies positive.  The rest of them were negative.  Vitamin D was in the lower limits of normal at 36.  B12 was normal.  Sed rate was normal.  C3 and C4 were normal.  CBC with diff, AST, ALT, and creatinine were normal.    Labs from May of 2011, C3, C4, UA, vitamin D were normal.  Sed rate was slightly elevated at 21.  ANA was positive, no titer given.  Double stranded DNA was negative.  RNP was negative.  Tamala Julian was negative.  The only antibody positive was RO antibody.    May 2017:  CBC was normal.  Comprehensive metabolic panel and UA were normal. In January 2017, complements were normal.  Double-stranded DNA was negative.  ANA and Ro antibodies were positive.  Autoimmune disease with history of nasal ulcers, Raynaud's phenomenon, arthritis, discoid lupus, ANA, double stranded DNA, RO antibody, and anti-Cardiolipin antibody    Imaging: No results found.  Speciality Comments: No specialty comments available.    Procedures:  No procedures performed Allergies: Beef-derived products; Latex; and Sulfa antibiotics   Assessment / Plan:     Visit Diagnoses: Autoimmune disease ([place]) -    Autoimmune disease with history of  nasal ulcers, Raynaud's phenomenon, arthritis, discoid lupus, ANA, double stranded DNA, RO antibody, and anti-Cardiolipin   Discoid lupus -    Autoimmune disease with history of nasal ulcers, Raynaud's phenomenon, arthritis, discoid lupus, ANA, double stranded DNA, RO antibody, and anti-Cardiolipin   High risk medication use  Anticardiolipin antibody positive  History of DVT (deep vein thrombosis)  History of pulmonary embolism  Raynaud's disease without gangrene  Primary osteoarthritis of both hands  Anxiety  Irritable bowel syndrome with constipation  Primary insomnia    Orders: No orders of the defined types were placed in this encounter.  No orders of the defined types were placed in this encounter.   Face-to-face time spent with patient was *** minutes. 50% of time was spent in counseling and coordination of care.  Follow-Up Instructions: No Follow-up on file.   Bo Merino, MD  Note - This record has been created using Editor, commissioning.  Chart creation errors have been sought, but may not always  have been located. Such creation errors do not reflect on  the standard of medical care.

## 2016-11-27 ENCOUNTER — Ambulatory Visit: Payer: 59 | Admitting: Rheumatology

## 2016-11-29 DIAGNOSIS — H16101 Unspecified superficial keratitis, right eye: Secondary | ICD-10-CM | POA: Diagnosis not present

## 2016-12-11 ENCOUNTER — Ambulatory Visit (INDEPENDENT_AMBULATORY_CARE_PROVIDER_SITE_OTHER): Payer: 59

## 2016-12-11 ENCOUNTER — Ambulatory Visit (INDEPENDENT_AMBULATORY_CARE_PROVIDER_SITE_OTHER): Payer: 59 | Admitting: Rheumatology

## 2016-12-11 ENCOUNTER — Telehealth: Payer: Self-pay | Admitting: *Deleted

## 2016-12-11 ENCOUNTER — Encounter: Payer: Self-pay | Admitting: Rheumatology

## 2016-12-11 VITALS — BP 100/52 | HR 66 | Resp 14 | Ht 64.0 in | Wt 110.0 lb

## 2016-12-11 DIAGNOSIS — G8929 Other chronic pain: Secondary | ICD-10-CM

## 2016-12-11 DIAGNOSIS — M25562 Pain in left knee: Secondary | ICD-10-CM

## 2016-12-11 DIAGNOSIS — M25561 Pain in right knee: Secondary | ICD-10-CM

## 2016-12-11 DIAGNOSIS — L930 Discoid lupus erythematosus: Secondary | ICD-10-CM | POA: Diagnosis not present

## 2016-12-11 DIAGNOSIS — Z79899 Other long term (current) drug therapy: Secondary | ICD-10-CM

## 2016-12-11 DIAGNOSIS — Z86718 Personal history of other venous thrombosis and embolism: Secondary | ICD-10-CM | POA: Diagnosis not present

## 2016-12-11 DIAGNOSIS — M19041 Primary osteoarthritis, right hand: Secondary | ICD-10-CM | POA: Diagnosis not present

## 2016-12-11 DIAGNOSIS — M359 Systemic involvement of connective tissue, unspecified: Secondary | ICD-10-CM | POA: Diagnosis not present

## 2016-12-11 DIAGNOSIS — M19042 Primary osteoarthritis, left hand: Secondary | ICD-10-CM

## 2016-12-11 MED ORDER — LIDOCAINE HCL 1 % IJ SOLN
1.5000 mL | INTRAMUSCULAR | Status: AC | PRN
  Administered 2016-12-11: 1.5 mL

## 2016-12-11 MED ORDER — TRIAMCINOLONE ACETONIDE 40 MG/ML IJ SUSP
40.0000 mg | INTRAMUSCULAR | Status: AC | PRN
  Administered 2016-12-11: 40 mg via INTRA_ARTICULAR

## 2016-12-11 NOTE — Telephone Encounter (Signed)
-----   Message from [place], [place] sent at 12/11/2016 12:55 PM EST ----- Regarding: Tell patient: Moderate to severe OA both knees; we will do prior auth of both knees   Tell patient ==>  Moderate/severe OA of bilateral knee joint

## 2016-12-11 NOTE — Addendum Note (Signed)
Addended byEliezer Lofts on: 12/11/2016 12:57 PM   Modules accepted: Orders

## 2016-12-11 NOTE — Progress Notes (Signed)
Office Visit Note  Patient: Erica Wood             Date of Birth: May 20, 1966           MRN: [id number]             PCP: Precious Reel, MD Referring: Shon Baton, MD Visit Date: 12/11/2016 Occupation: _0 @    Subjective:  Pain of the Right Knee and Follow-up Follow-up on autoimmune disease  History of Present Illness: Erica Wood is a 51 y.o. female  Last seen 08/27/2016 Patient is doing relatively well with her autoimmune disease (discoid lupus). She does have occasional nasal ulcer but rarely and mild when they do occur. She does have ongoing arthritis. She does not have any Raynaud's flare lately.  She takes Plaquenil but unfortunately she is taking 300 mg at one time and set of doing at 200 in the morning and 100 at night. We discussed the importance of separating out the dosages and she is agreeable.  She has ongoing right knee joint pain. It was injected by Dr. Estanislado Pandy to previous visit. She is requesting a another cortisone injection because it's hurting. She also states that when the right knee is improved she starts feeling pain in the left knee because it's also causing her some discomfort but not as bad as the right.  Patient has not had any labs done since about May 2017. I discussed the importance of getting updated labs for proper care and management of her disease process and her medications.  [name] had a Plaquenil eye exam done in January 2018 through Dr. Arlyn Leak office. Her Plaquenil eye exam was normal. We do not have any documentation of this eye exam and I have asked her to get that done for [place].  Patient wants to have a Visco supplementation if appropriate. Since she has failed weight loss, and states, cortisone injection given by Dr. Estanislado Pandy last previous visit and she has proven OA of the knee joint, she is eligible for Visco supplementation. Days on her weight of 113 pounds, I suspect that she might do well with Euflex and we will  apply for it.   Activities of Daily Living:  Patient reports morning stiffness for 15 minutes.   Patient Reports nocturnal pain.  Difficulty dressing/grooming: Denies Difficulty climbing stairs: Reports Difficulty getting out of chair: Denies Difficulty using hands for taps, buttons, cutlery, and/or writing: Denies   Review of Systems  Constitutional: Negative for fatigue.  HENT: Negative for mouth sores and mouth dryness.   Eyes: Negative for dryness.  Respiratory: Negative for shortness of breath.   Gastrointestinal: Negative for constipation and diarrhea.  Musculoskeletal: Negative for myalgias and myalgias.  Skin: Negative for sensitivity to sunlight.  Psychiatric/Behavioral: Negative for decreased concentration and sleep disturbance.    PMFS History:  Patient Active Problem List   Diagnosis Date Noted  . Raynaud's disease without gangrene 11/26/2016  . 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 Anticardiolipin antibody positive 11/26/2016  . Autoimmune disease ([place]) 11/25/2016  . High risk medication use 11/25/2016  . Primary osteoarthritis of both hands 11/25/2016  . History of DVT (deep vein thrombosis) 11/25/2016  . History of pulmonary embolism 11/25/2016  . Primary insomnia 11/25/2016  . Anxiety 11/25/2016  . Irritable bowel syndrome with constipation 11/25/2016  . Varicose veins of lower extremities with other complications 09/73/5329  . Spider veins of both lower extremities 05/24/2014    Past Medical History:  Diagnosis Date  . Anti-cardiolipin antibody positive   .  Anti-cardiolipin antibody syndrome ([facility])   . Anxiety   . Arthralgia   . Atypical chest pain   . Autoimmune disease ([place])   . Chronic fatigue   . DVT (deep venous thrombosis) ([place])   . Pulmonary embolus ([place])   . Raynaud's syndrome   . Sicca ([place])   . Thrombocytopenia ([place])     No family history on file. Past Surgical History:  Procedure Laterality Date  . APPENDECTOMY    . COLON SURGERY      Perforated colon repair after colostomy  . HYSTEROSCOPY W/D&C N/A 01/13/2014   Procedure: DILATATION AND CURETTAGE /HYSTEROSCOPY with resection ;  Surgeon: Olga Millers, MD;  Location: [facility];  Service: Gynecology;  Laterality: N/A;  . LAPAROTOMY     Social History   Social History Narrative  . No narrative on file     Objective: Vital Signs: BP (!) 100/52   Pulse 66   Resp 14   Ht _0  (1.626 m)   Wt 110 lb (49.9 kg)   LMP 10/30/2016 (Approximate)   BMI 18.88 kg/m    Physical Exam  Nursing note and vitals reviewed.    Musculoskeletal Exam:  Full range of motion of all joints Grip strength is equal and strong bilaterally Fibromyalgia tender points are absent  CDAI Exam: CDAI Homunculus Exam:   Tenderness:  Right hand: 1st MCP, 2nd MCP and 3rd MCP Left hand: 1st MCP, 2nd MCP and 3rd MCP  Swelling:  Right hand: 1st MCP, 2nd MCP and 3rd MCP Left hand: 1st MCP, 2nd MCP and 3rd MCP  Joint Counts:  CDAI Tender Joint count: 6 CDAI Swollen Joint count: 6  Global Assessments:  Patient Global Assessment: 10 Provider Global Assessment: 10  CDAI Calculated Score: 32    Investigation: No additional findings.   Imaging: No results found.  Speciality Comments: No specialty comments available.    Procedures:  Large Joint Inj Date/Time: 12/11/2016 11:56 AM Performed by: Eliezer Lofts Authorized by: Eliezer Lofts   Consent Given by:  Patient Site marked: the procedure site was marked   Timeout: prior to procedure the correct patient, procedure, and site was verified   Indications:  Pain and joint swelling Location:  Knee Prep: patient was prepped and draped in usual sterile fashion   Needle Size:  27 G Needle Length:  1.5 inches Approach:  Medial Ultrasound Guidance: No   Fluoroscopic Guidance: No   Arthrogram: No   Medications:  1.5 mL lidocaine 1 %; 40 mg triamcinolone acetonide 40 MG/ML Aspiration Attempted: Yes   Patient tolerance:  Patient  tolerated the procedure well with no immediate complications   Allergies: Beef-derived products; Latex; and Sulfa antibiotics   Assessment / Plan:     Visit Diagnoses: Autoimmune disease ([place]) - +ana; +dsdna; +Ro; +aCL;Hx Discoid, raynauds,arthritis; - Plan: Urinalysis, Routine w reflex microscopic, Cardiolipin antibodies, IgG, IgM, IgA, Sedimentation rate, C3 and C4, VITAMIN D 25 Hydroxy (Vit-D Deficiency, Fractures), Anti-DNA antibody, double-stranded, CBC with Differential/Platelet, CMP14+EGFR  High risk medications (not anticoagulants) long-term use - +plq 200am + 100pm; - Plan: CBC with Differential/Platelet, CMP14+EGFR  Primary osteoarthritis of both hands  History of DVT (deep vein thrombosis) - on Xarelto  Discoid lupus  Chronic pain of right knee  Plan: #1: Autoimmune disease, discoid lupus. Doing relatively well with Plaquenil. Patient is not taking her Plaquenil as prescribed. We discussed proper way of taking Plaquenil and we changed her dosage to 200 mg in the morning and 200 mg in the  evening 5 days a week. Currently she is taking 200 in the morning and 100 and the evening 7 days a week.  #2: OA of the knee joint. Right knee joint pain Patient got a cortisone injection in the right knee today as treatment for her OA. She will benefit from Visco supplementation and we'll apply for Euflex (I prefer this for the patient)  #3: Plaquenil eye exam was done January 2018 at Dr. Arlyn Leak office according to the patient and the exam was normal. We do not have documentation of this and patient is requesting get [place] a copy.  #4: Patient needs the following labs to [facility] (that is her worksite). Urinalysis, double-stranded DNA, anti-cardiolipin, sedimentation rate, C3-C4, vitamin D, CMP W/ GFR, AND CBC W/ DIFF. Paperwork given to the patient to get the labs done at [facility].  #5: Return to clinic in 4 months  Orders: Orders Placed This Encounter  Procedures  . Large Joint  Injection/Arthrocentesis  . Urinalysis, Routine w reflex microscopic  . Cardiolipin antibodies, IgG, IgM, IgA  . Sedimentation rate  . C3 and C4  . VITAMIN D 25 Hydroxy (Vit-D Deficiency, Fractures)  . Anti-DNA antibody, double-stranded  . CBC with Differential/Platelet  . CMP14+EGFR   No orders of the defined types were placed in this encounter.   Face-to-face time spent with patient was 40 minutes. 50% of time was spent in counseling and coordination of care.  Follow-Up Instructions: Return in about 5 months (around 05/10/2017) for A.D.; PLQ 200 + 100; OAHANDS; raynauds, RT KNEE PAIN.   Eliezer Lofts, PA-C  Note - This record has been created using [employer].  Chart creation errors have been sought, but may not always  have been located. Such creation errors do not reflect on  the standard of medical care.

## 2016-12-11 NOTE — Telephone Encounter (Signed)
Attempted to contact the patient and left message for patient to call the office.  

## 2016-12-11 NOTE — Patient Instructions (Signed)
  Plaquenil eye exam due every January (annually)   Erica Wood 1966-10-15

## 2016-12-12 NOTE — Telephone Encounter (Signed)
Patient advised of results and verbalized understanding.  

## 2016-12-12 NOTE — Telephone Encounter (Signed)
Patient returned Erica Wood's phone call. Please call patient.

## 2016-12-12 NOTE — Telephone Encounter (Signed)
Attempted to return patient's call and left message for patient to call the office.  

## 2016-12-17 ENCOUNTER — Telehealth: Payer: Self-pay | Admitting: Rheumatology

## 2016-12-17 MED ORDER — AMPHETAMINE-DEXTROAMPHET ER 20 MG PO CP24: 20.0000 mg | ORAL_CAPSULE | Freq: Every day | ORAL | 0 refills | Status: DC

## 2016-12-17 NOTE — Telephone Encounter (Signed)
Patient advised prescription is ready for pick up.  

## 2016-12-17 NOTE — Telephone Encounter (Signed)
done

## 2016-12-17 NOTE — Telephone Encounter (Signed)
Last Visit: 12/11/16 Next Visit: due May 2018 Message sent to the front to schedule patient   Okay to refill Adderall?

## 2016-12-17 NOTE — Telephone Encounter (Signed)
Patient needs a refill of Aderall.

## 2016-12-17 NOTE — Telephone Encounter (Signed)
Will you print prescription please.

## 2016-12-17 NOTE — Telephone Encounter (Signed)
Okay to refill Adderall? 

## 2016-12-27 DIAGNOSIS — M359 Systemic involvement of connective tissue, unspecified: Secondary | ICD-10-CM | POA: Diagnosis not present

## 2016-12-27 DIAGNOSIS — Z79899 Other long term (current) drug therapy: Secondary | ICD-10-CM | POA: Diagnosis not present

## 2017-01-02 ENCOUNTER — Telehealth: Payer: Self-pay | Admitting: Rheumatology

## 2017-01-02 DIAGNOSIS — D899 Disorder involving the immune mechanism, unspecified: Secondary | ICD-10-CM | POA: Diagnosis not present

## 2017-01-02 DIAGNOSIS — Z23 Encounter for immunization: Secondary | ICD-10-CM | POA: Diagnosis not present

## 2017-01-02 DIAGNOSIS — M359 Systemic involvement of connective tissue, unspecified: Secondary | ICD-10-CM | POA: Diagnosis not present

## 2017-01-02 DIAGNOSIS — R51 Headache: Secondary | ICD-10-CM | POA: Diagnosis not present

## 2017-01-02 DIAGNOSIS — Z0000 Encounter for general adult medical examination without abnormal findings: Secondary | ICD-10-CM | POA: Diagnosis not present

## 2017-01-02 LAB — CBC WITH DIFFERENTIAL/PLATELET
BASOS: 1 %
Basophils Absolute: 0.1 10*3/uL (ref 0.0–0.2)
EOS (ABSOLUTE): 0.1 10*3/uL (ref 0.0–0.4)
Eos: 1 %
Hematocrit: 47.1 % — ABNORMAL HIGH (ref 34.0–46.6)
Hemoglobin: 15.8 g/dL (ref 11.1–15.9)
IMMATURE GRANS (ABS): 0 10*3/uL (ref 0.0–0.1)
Immature Granulocytes: 0 %
LYMPHS: 27 %
Lymphocytes Absolute: 2.4 10*3/uL (ref 0.7–3.1)
MCH: 31.9 pg (ref 26.6–33.0)
MCHC: 33.5 g/dL (ref 31.5–35.7)
MCV: 95 fL (ref 79–97)
Monocytes Absolute: 0.6 10*3/uL (ref 0.1–0.9)
Monocytes: 7 %
NEUTROS ABS: 5.5 10*3/uL (ref 1.4–7.0)
Neutrophils: 64 %
PLATELETS: 240 10*3/uL (ref 150–379)
RBC: 4.95 x10E6/uL (ref 3.77–5.28)
RDW: 12.9 % (ref 12.3–15.4)
WBC: 8.7 10*3/uL (ref 3.4–10.8)

## 2017-01-02 LAB — C3 AND C4
COMPLEMENT C4, SERUM: 17 mg/dL (ref 14–44)
Complement C3, Serum: 92 mg/dL (ref 82–167)

## 2017-01-02 LAB — ANTI-DNA ANTIBODY, DOUBLE-STRANDED: dsDNA Ab: 2 IU/mL (ref 0–9)

## 2017-01-02 LAB — URINALYSIS, ROUTINE W REFLEX MICROSCOPIC
Bilirubin, UA: NEGATIVE
Glucose, UA: NEGATIVE
Ketones, UA: NEGATIVE
LEUKOCYTES UA: NEGATIVE
Nitrite, UA: NEGATIVE
PH UA: 5.5 (ref 5.0–7.5)
Protein, UA: NEGATIVE
RBC UA: NEGATIVE
Specific Gravity, UA: 1.006 (ref 1.005–1.030)
Urobilinogen, Ur: 0.2 mg/dL (ref 0.2–1.0)

## 2017-01-02 LAB — CMP14+EGFR
A/G RATIO: 1.8 (ref 1.2–2.2)
ALT: 15 IU/L (ref 0–32)
AST: 19 IU/L (ref 0–40)
Albumin: 4.4 g/dL (ref 3.5–5.5)
Alkaline Phosphatase: 55 IU/L (ref 39–117)
BILIRUBIN TOTAL: 0.8 mg/dL (ref 0.0–1.2)
BUN/Creatinine Ratio: 14 (ref 9–23)
BUN: 9 mg/dL (ref 6–24)
CHLORIDE: 98 mmol/L (ref 96–106)
CO2: 25 mmol/L (ref 18–29)
Calcium: 8.9 mg/dL (ref 8.7–10.2)
Creatinine, Ser: 0.65 mg/dL (ref 0.57–1.00)
GFR calc non Af Amer: 105 (ref >59)
GFR, EST AFRICAN AMERICAN: 121 (ref >59)
GLOBULIN, TOTAL: 2.4 (ref 1.5–4.5)
Glucose: 94 mg/dL (ref 65–99)
Potassium: 4.5 mmol/L (ref 3.5–5.2)
SODIUM: 137 mmol/L (ref 134–144)
Total Protein: 6.8 g/dL (ref 6.0–8.5)

## 2017-01-02 LAB — CARDIOLIPIN ANTIBODIES, IGG, IGM, IGA
Anticardiolipin IgG: <9 GPL U/mL (ref 0–14)
Anticardiolipin IgM: 18 MPL U/mL — ABNORMAL HIGH (ref 0–12)

## 2017-01-02 LAB — SEDIMENTATION RATE: Sed Rate: 2 mm/hr (ref 0–32)

## 2017-01-02 LAB — VITAMIN D 25 HYDROXY (VIT D DEFICIENCY, FRACTURES): VIT D 25 HYDROXY: 35.7 ng/mL (ref 30.0–100.0)

## 2017-01-02 NOTE — Telephone Encounter (Signed)
Patient called asking for her results of her lab work.  [phone].  Thank you.

## 2017-01-03 NOTE — Telephone Encounter (Signed)
Left message to advise patient of lab results.  

## 2017-01-07 DIAGNOSIS — Z01419 Encounter for gynecological examination (general) (routine) without abnormal findings: Secondary | ICD-10-CM | POA: Diagnosis not present

## 2017-01-07 DIAGNOSIS — Z124 Encounter for screening for malignant neoplasm of cervix: Secondary | ICD-10-CM | POA: Diagnosis not present

## 2017-01-14 ENCOUNTER — Other Ambulatory Visit: Payer: Self-pay | Admitting: Rheumatology

## 2017-01-14 MED ORDER — AMPHETAMINE-DEXTROAMPHET ER 20 MG PO CP24: 20.0000 mg | ORAL_CAPSULE | Freq: Every day | ORAL | 0 refills | Status: DC

## 2017-01-14 NOTE — Telephone Encounter (Signed)
Patient is requesting refill of adderall.  

## 2017-01-14 NOTE — Telephone Encounter (Signed)
Last Visit: 12/11/16 Next Visit: 03/20/17  Okay to refill Adderall?  

## 2017-02-12 ENCOUNTER — Other Ambulatory Visit: Payer: Self-pay | Admitting: Rheumatology

## 2017-02-12 MED ORDER — AMPHETAMINE-DEXTROAMPHET ER 20 MG PO CP24: 20.0000 mg | ORAL_CAPSULE | Freq: Every day | ORAL | 0 refills | Status: DC

## 2017-02-12 NOTE — Telephone Encounter (Signed)
Last Visit: 12/11/16 Next Visit: 03/20/17  Okay to refill Adderall?

## 2017-02-12 NOTE — Telephone Encounter (Signed)
Patient called requesting a refill for her Adderall.  CB#[phone].  Thank you.

## 2017-02-26 ENCOUNTER — Telehealth: Payer: Self-pay | Admitting: Rheumatology

## 2017-02-26 NOTE — Telephone Encounter (Signed)
Patient called checking on the status of her injections.  She is wanting to know if we have heard anything from her insurance company.  CB#[phone].  Thank you.

## 2017-02-27 ENCOUNTER — Telehealth: Payer: Self-pay | Admitting: Rheumatology

## 2017-02-27 MED ORDER — DICLOFENAC SODIUM 1 % TD GEL: TRANSDERMAL | 3 refills | Status: DC

## 2017-02-27 NOTE — Telephone Encounter (Signed)
Pending benefits 

## 2017-02-27 NOTE — Telephone Encounter (Signed)
Attempted to contact the patient. Left message for patient to call the office. Ivin Booty has put in application for Euflexxa

## 2017-02-27 NOTE — Telephone Encounter (Signed)
Patient called wanting to know what she could take for pain for her knee.  She has tried Excendrin, but it's not working.  She has an appointment on May 10 and also wants to know about her injections for her knee.  CB#[phone].  Thank you.

## 2017-02-27 NOTE — Telephone Encounter (Signed)
Voltaren gel can help. Ok to Rx if pt is agreeable. Voltaren gel; apply 3 g to 3 large joints up to 3 times a day; dispensed 3 tubes with 3 refills  Also, consider prednisone taper if needed.    Also, ask sharon status of visco for patient.  =================If patient cannot take Voltaren gel, we can give her a knee injection. She is welcome to come in tomorrow for knee injection during the morning session at 11:30.am  Patient rates pain as "8" lately. Pain since last 2 weeks

## 2017-02-27 NOTE — Addendum Note (Signed)
Addended by: Carole Binning on: 02/27/2017 04:52 PM   Modules accepted: Orders

## 2017-02-27 NOTE — Telephone Encounter (Signed)
Patient states she is having pain daily. Patient states she has trouble when kneeling. Patient states she had a cortisone injection in January 2018 and has an appointment on Mar 20, 2017. Patient would like to know what she can take for the knee pain. She states Ibuprofen due to stomach issues.

## 2017-03-14 ENCOUNTER — Other Ambulatory Visit: Payer: Self-pay | Admitting: Rheumatology

## 2017-03-14 MED ORDER — AMPHETAMINE-DEXTROAMPHET ER 20 MG PO CP24: 20.0000 mg | ORAL_CAPSULE | Freq: Every day | ORAL | 0 refills | Status: DC

## 2017-03-14 NOTE — Telephone Encounter (Signed)
ok 

## 2017-03-14 NOTE — Telephone Encounter (Signed)
Patient is requesting refill of adderall.  

## 2017-03-14 NOTE — Telephone Encounter (Signed)
Last Visit: 12/11/16 Next Visit: 03/20/17  Okay to refill Adderall?

## 2017-03-19 NOTE — Telephone Encounter (Signed)
In folder, pending benefits

## 2017-03-20 ENCOUNTER — Encounter: Payer: Self-pay | Admitting: Rheumatology

## 2017-03-20 ENCOUNTER — Telehealth: Payer: Self-pay | Admitting: Rheumatology

## 2017-03-20 ENCOUNTER — Ambulatory Visit (INDEPENDENT_AMBULATORY_CARE_PROVIDER_SITE_OTHER): Payer: 59 | Admitting: Rheumatology

## 2017-03-20 VITALS — BP 102/68 | HR 82 | Resp 14 | Ht 64.0 in | Wt 108.0 lb

## 2017-03-20 DIAGNOSIS — L930 Discoid lupus erythematosus: Secondary | ICD-10-CM | POA: Diagnosis not present

## 2017-03-20 DIAGNOSIS — R768 Other specified abnormal immunological findings in serum: Secondary | ICD-10-CM | POA: Diagnosis not present

## 2017-03-20 DIAGNOSIS — M19041 Primary osteoarthritis, right hand: Secondary | ICD-10-CM

## 2017-03-20 DIAGNOSIS — D8989 Other specified disorders involving the immune mechanism, not elsewhere classified: Secondary | ICD-10-CM | POA: Diagnosis not present

## 2017-03-20 DIAGNOSIS — Z79899 Other long term (current) drug therapy: Secondary | ICD-10-CM | POA: Diagnosis not present

## 2017-03-20 DIAGNOSIS — M359 Systemic involvement of connective tissue, unspecified: Secondary | ICD-10-CM

## 2017-03-20 DIAGNOSIS — M19042 Primary osteoarthritis, left hand: Secondary | ICD-10-CM

## 2017-03-20 DIAGNOSIS — Z86711 Personal history of pulmonary embolism: Secondary | ICD-10-CM

## 2017-03-20 DIAGNOSIS — I7300 Raynaud's syndrome without gangrene: Secondary | ICD-10-CM | POA: Diagnosis not present

## 2017-03-20 DIAGNOSIS — Z86718 Personal history of other venous thrombosis and embolism: Secondary | ICD-10-CM

## 2017-03-20 DIAGNOSIS — R760 Raised antibody titer: Secondary | ICD-10-CM

## 2017-03-20 NOTE — Telephone Encounter (Addendum)
Patient called this am to see if someone could call her back to advise her on activities she can do post knee injection. Please call patient to discuss.   Patient calling with questions about activity following knee injection from today. Please call to discuss.

## 2017-03-20 NOTE — Progress Notes (Signed)
Office Visit Note  Patient: Erica Wood             Date of Birth: 1966-01-18           MRN: [id number]             PCP: Shon Baton, MD Referring: Shon Baton, MD Visit Date: 03/20/2017 Occupation: _0 @    Subjective:  Pain hands   History of Present Illness: Erica Wood is a 51 y.o. female  Last seen 12/11/2016. Patient has a history of autoimmune disease (discoid lupus).  Autoimmune workup was last done 11/16/2015 at [facility].  Autoimmune workup also done on February 2018. Autoimmune workup was negative except positive anticardiolipin IgM. Please see labs below under investigation heading for full details.  =====11/16/2015================== Sedimentation rate normal at 2 ANA positive Sjogren's (SSA) positive at 1.4 RNP negative Smith negative Sjogren's (SSB) negative Double-stranded DNA negative Vitamin D normal at 37 C3-C4 normal 75, 15 respectively Urinalysis is normal =================================  Patient is complaining about bilateral hand swelling at the right second and third MCP joint and the left first second and third MCP joint with pain. She has not missed a dose of Plaquenil. She's taking Plaquenil 200 mg in the morning and 100 mg in the evening as scheduled. She has not missed a dose. Patient has had a Plaquenil eye exam done in January 2018 and patient states it was normal.  She is also complaining of right knee pain greater than left knee pain. We applied for Euflex the [place] January 2018 but she has not heard back regarding the status of her Euflexxa application. Currently since she is in pain she is requesting another cortisone injection in the right knee.   Activities of Daily Living:  Patient reports morning stiffness for 15 minutes.   Patient Reports nocturnal pain.  Difficulty dressing/grooming: Denies Difficulty climbing stairs: Denies Difficulty getting out of chair: Reports Difficulty using hands for taps, buttons,  cutlery, and/or writing: Reports   Review of Systems  Constitutional: Negative for fatigue.  HENT: Negative for mouth sores and mouth dryness.   Eyes: Negative for dryness.  Respiratory: Negative for shortness of breath.   Gastrointestinal: Negative for constipation and diarrhea.  Musculoskeletal: Negative for myalgias and myalgias.  Skin: Negative for sensitivity to sunlight.  Psychiatric/Behavioral: Negative for decreased concentration and sleep disturbance.    PMFS History:  Patient Active Problem List   Diagnosis Date Noted  . Raynaud's disease without gangrene 11/26/2016  . 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 Anticardiolipin antibody positive 11/26/2016  . Autoimmune disease ([place]) 11/25/2016  . High risk medication use 11/25/2016  . Primary osteoarthritis of both hands 11/25/2016  . History of DVT (deep vein thrombosis) 11/25/2016  . History of pulmonary embolism 11/25/2016  . Primary insomnia 11/25/2016  . Anxiety 11/25/2016  . Irritable bowel syndrome with constipation 11/25/2016  . Varicose veins of lower extremities with other complications 30/94/0768  . Spider veins of both lower extremities 05/24/2014    Past Medical History:  Diagnosis Date  . Anti-cardiolipin antibody positive   . Anti-cardiolipin antibody syndrome (HCC)   . Anxiety   . Arthralgia   . Atypical chest pain   . Autoimmune disease ([place])   . Chronic fatigue   . DVT (deep venous thrombosis) ([place])   . Pulmonary embolus ([place])   . Raynaud's syndrome   . Sicca ([place])   . Thrombocytopenia ([place])     History reviewed. No pertinent family history. Past Surgical History:  Procedure Laterality  Date  . APPENDECTOMY    . COLON SURGERY     Perforated colon repair after colostomy  . HYSTEROSCOPY W/D&C N/A 01/13/2014   Procedure: DILATATION AND CURETTAGE /HYSTEROSCOPY with resection ;  Surgeon: Olga Millers, MD;  Location: [facility];  Service: Gynecology;  Laterality: N/A;  . LAPAROTOMY     Social History    Social History Narrative  . No narrative on file     Objective: Vital Signs: BP 102/68   Pulse 82   Resp 14   Ht _0  (1.626 m)   Wt 108 lb (49 kg)   LMP 02/18/2017 (Approximate)   BMI 18.54 kg/m    Physical Exam  Constitutional: She is oriented to person, place, and time. She appears well-developed and well-nourished.  HENT:  Head: Normocephalic and atraumatic.  Eyes: EOM are normal. Pupils are equal, round, and reactive to light.  Cardiovascular: Normal rate, regular rhythm and normal heart sounds.  Exam reveals no gallop and no friction rub.   No murmur heard. Pulmonary/Chest: Effort normal and breath sounds normal. She has no wheezes. She has no rales.  Abdominal: Soft. Bowel sounds are normal. She exhibits no distension. There is no tenderness. There is no guarding. No hernia.  Musculoskeletal: Normal range of motion. She exhibits no edema, tenderness or deformity.  Lymphadenopathy:    She has no cervical adenopathy.  Neurological: She is alert and oriented to person, place, and time. Coordination normal.  Skin: Skin is warm and dry. Capillary refill takes less than 2 seconds. No rash noted.  Psychiatric: She has a normal mood and affect. Her behavior is normal.  Nursing note and vitals reviewed.    Musculoskeletal Exam:    CDAI Exam: CDAI Homunculus Exam:   Tenderness:  Right hand: 2nd MCP and 3rd MCP Left hand: 1st MCP, 2nd MCP and 3rd MCP  Swelling:  Right hand: 2nd MCP and 3rd MCP Left hand: 1st MCP, 2nd MCP and 3rd MCP  Joint Counts:  CDAI Tender Joint count: 5 CDAI Swollen Joint count: 5     Investigation: Office Visit on 12/11/2016  Component Date Value Ref Range Status  . Specific Gravity, UA 12/27/2016 1.006  1.005 - 1.030 Final  . pH, UA 12/27/2016 5.5  5.0 - 7.5 Final  . Color, UA 12/27/2016 Yellow  Yellow Final  . Appearance Ur 12/27/2016 Clear  Clear Final  . Leukocytes, UA 12/27/2016 Negative  Negative Final  . Protein, UA  12/27/2016 Negative  Negative/Trace Final  . Glucose, UA 12/27/2016 Negative  Negative Final  . Ketones, UA 12/27/2016 Negative  Negative Final  . RBC, UA 12/27/2016 Negative  Negative Final  . Bilirubin, UA 12/27/2016 Negative  Negative Final  . Urobilinogen, Ur 12/27/2016 0.2  0.2 - 1.0 mg/dL Final  . Nitrite, UA 12/27/2016 Negative  Negative Final  . Microscopic Examination 12/27/2016 Comment   Final   Microscopic not indicated and not performed.  . Anticardiolipin IgG 12/27/2016 <9  0 - 14 GPL U/mL Final   Comment:                           Negative:              <15                           Indeterminate:     15 - 20  Low-Med Positive: >20 - 80                           High Positive:         >80   . Anticardiolipin IgM 12/27/2016 18* 0 - 12 MPL U/mL Final   Comment:                           Negative:              <13                           Indeterminate:     13 - 20                           Low-Med Positive: >20 - 80                           High Positive:         >80   . Anticardiolipin IgA 12/27/2016 <9  0 - 11 APL U/mL Final   Comment:                           Negative:              <12                           Indeterminate:     12 - 20                           Low-Med Positive: >20 - 80                           High Positive:         >80   . Sed Rate 12/27/2016 2  0 - 32 mm/hr Final  . Complement C3, Serum 12/27/2016 92  82 - 167 mg/dL Final  . Complement C4, Serum 12/27/2016 17  14 - 44 mg/dL Final  . Vit D, 25-Hydroxy 12/27/2016 35.7  30.0 - 100.0 ng/mL Final   Comment: Vitamin D deficiency has been defined by the [place] practice guideline as a level of serum 25-OH vitamin D less than 20 ng/mL (1,2). The Endocrine Society went on to further define vitamin D insufficiency as a level between 21 and 29 ng/mL (2). 1. IOM (Institute of Medicine). 2010. Dietary reference    intakes for calcium and  D. [place]: The    [employer]. 2. Holick MF, Binkley Routt, Bischoff-Ferrari HA, et al.    Evaluation, treatment, and prevention of vitamin D    deficiency: an Endocrine Society clinical practice    guideline. JCEM. 2011 Jul; 96(7):1911-30.   Marland Kitchen dsDNA Ab 12/27/2016 2  0 - 9 IU/mL Final   Comment:                                    Negative      <5  Equivocal  5 - 9                                    Positive      >9   . WBC 12/27/2016 8.7  3.4 - 10.8 x10E3/uL Final  . RBC 12/27/2016 4.95  3.77 - 5.28 x10E6/uL Final  . Hemoglobin 12/27/2016 15.8  11.1 - 15.9 g/dL Final  . Hematocrit 12/27/2016 47.1* 34.0 - 46.6 % Final  . MCV 12/27/2016 95  79 - 97 fL Final  . MCH 12/27/2016 31.9  26.6 - 33.0 pg Final  . MCHC 12/27/2016 33.5  31.5 - 35.7 g/dL Final  . RDW 12/27/2016 12.9  12.3 - 15.4 % Final  . Platelets 12/27/2016 240  150 - 379 x10E3/uL Final  . Neutrophils 12/27/2016 64  Not Estab. % Final  . Lymphs 12/27/2016 27  Not Estab. % Final  . Monocytes 12/27/2016 7  Not Estab. % Final  . Eos 12/27/2016 1  Not Estab. % Final  . Basos 12/27/2016 1  Not Estab. % Final  . Neutrophils Absolute 12/27/2016 5.5  1.4 - 7.0 x10E3/uL Final  . Lymphocytes Absolute 12/27/2016 2.4  0.7 - 3.1 x10E3/uL Final  . Monocytes Absolute 12/27/2016 0.6  0.1 - 0.9 x10E3/uL Final  . EOS (ABSOLUTE) 12/27/2016 0.1  0.0 - 0.4 x10E3/uL Final  . Basophils Absolute 12/27/2016 0.1  0.0 - 0.2 x10E3/uL Final  . Immature Granulocytes 12/27/2016 0  Not Estab. % Final  . Immature Grans (Abs) 12/27/2016 0.0  0.0 - 0.1 x10E3/uL Final  . Glucose 12/27/2016 94  65 - 99 mg/dL Final  . BUN 12/27/2016 9  6 - 24 mg/dL Final  . Creatinine, Ser 12/27/2016 0.65  0.57 - 1.00 mg/dL Final  . GFR calc non Af Amer 12/27/2016 105  >59 Final  . GFR calc Af Amer 12/27/2016 121  >59 Final  . BUN/Creatinine Ratio 12/27/2016 14  9 - 23 Final  . Sodium 12/27/2016 137  134 - 144 mmol/L Final   . Potassium 12/27/2016 4.5  3.5 - 5.2 mmol/L Final  . Chloride 12/27/2016 98  96 - 106 mmol/L Final  . CO2 12/27/2016 25  18 - 29 mmol/L Final  . Calcium 12/27/2016 8.9  8.7 - 10.2 mg/dL Final  . Total Protein 12/27/2016 6.8  6.0 - 8.5 g/dL Final  . Albumin 12/27/2016 4.4  3.5 - 5.5 g/dL Final  . Globulin, Total 12/27/2016 2.4  1.5 - 4.5 Final  . Albumin/Globulin Ratio 12/27/2016 1.8  1.2 - 2.2 Final  . Bilirubin Total 12/27/2016 0.8  0.0 - 1.2 mg/dL Final  . Alkaline Phosphatase 12/27/2016 55  39 - 117 IU/L Final  . AST 12/27/2016 19  0 - 40 IU/L Final  . ALT 12/27/2016 15  0 - 32 IU/L Final      Findings:   =====11/16/2015================== Sedimentation rate normal at 2 ANA positive Sjogren's (SSA) positive at 1.4 RNP negative Smith negative Sjogren's (SSB) negative Double-stranded DNA negative Vitamin D normal at 37 C3-C4 normal 75, 15 respectively Urinalysis is normal ==============================    Imaging: No results found.  Speciality Comments: No specialty comments available.    Procedures:  No procedures performed Allergies: Beef-derived products; Latex; and Sulfa antibiotics   Assessment / Plan:     Visit Diagnoses: Autoimmune disease ([place])  Discoid lupus  Raynaud's disease without gangrene  Anticardiolipin antibody positive  High risk medication use -  Plan: CBC with Differential/Platelet, CMP14+EGFR, Urinalysis, Routine w reflex microscopic, CBC with Differential/Platelet, CMP14+EGFR, Urinalysis, Routine w reflex microscopic  Primary osteoarthritis of both hands  History of DVT (deep vein thrombosis)  History of pulmonary embolism   Plan: #1: Autoimmune disease. Patient is currently having a flare. 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 Bilateral hands have synovitis. She has not missed a dose of her Plaquenil. I offered her methotrexate but patient declines methotrexate. (Patient states "my father has rheumatoid  arthritis and they gave her methotrexate for that. His platelets bottomed out and has never returned to normal and this all happened because of methotrexate".) Anticardiolipin IgM 12/27/2016 18*   Autoimmune workup is otherwise negative except for anti-cardiolipin positive on 12/27/2016 at 18. See above for full details  Last autoimmune workup prior to February 2018 was January 2017.  #2: High risk prescription. Plaquenil 200 mg in the morning and 100 mg in the evening. I've advised the patient to take 200 mg twice a day Monday through Friday only. Patient is very thin and this would be the adequate dose for her. We will do CBC with differential and CMP with GFR at [facility]. (Patient works at [facility] and needs to get her labs done there.)  #3: Right knee joint pain greater than left knee joint pain. I will give her cortisone injection today. Last cortisone was January 2018 and it helped her until now. We applied for [place] but patient has not heard back regarding this so we will reapply  #4: Bilateral hand pain. Current bleed having a flare. She's been taking her medication as prescribed. The last flare she had was about a month ago and it lasted for about a week. Patient is not interested in changing her Plaquenil to methotrexate at this time. Her father took methotrexate for rheumatoid arthritis and his platelets bottomed out and patient is fearful that that will happen to her.  #5: Return to clinic in 5 months     Orders: Orders Placed This Encounter  Procedures  . CBC with Differential/Platelet  . CMP14+EGFR  . Urinalysis, Routine w reflex microscopic   No orders of the defined types were placed in this encounter.   Face-to-face time spent with patient was 30 minutes. 50% of time was spent in counseling and coordination of care.  Follow-Up Instructions: Return in about 5 months (around 08/20/2017) for a.d.//plq 200 bid,m-f//sjogrens//hand synovitis flare//declines tx  change.   Eliezer Lofts, PA-C Patient is synovitis over her MCPs on my exam today. We discussed different treatment options including Arava and Imuran. At this point patient would like to wait. I've advised him case her symptoms get for she should contact us.  Bo Merino, MD, FACR  Note - This record has been created using [employer].  Chart creation errors have been sought, but may not always  have been located. Such creation errors do not reflect on  the standard of medical care.

## 2017-03-20 NOTE — Patient Instructions (Signed)
Leflunomide tablets What is this medicine? LEFLUNOMIDE (le FLOO na mide) is for rheumatoid arthritis. This medicine may be used for other purposes; ask your health care provider or pharmacist if you have questions. COMMON BRAND NAME(S): Arava What should I tell my health care provider before I take this medicine? They need to know if you have any of these conditions: -alcoholism -bone marrow problems -fever or infection -immune system problems -kidney disease -liver disease -an unusual or allergic reaction to leflunomide, teriflunomide, other medicines, lactose, foods, dyes, or preservatives -pregnant or trying to get pregnant -breast-feeding How should I use this medicine? Take this medicine by mouth with a full glass of water. Follow the directions on the prescription label. Take your medicine at regular intervals. Do not take your medicine more often than directed. Do not stop taking except on your doctor's advice. Talk to your pediatrician regarding the use of this medicine in children. Special care may be needed. Overdosage: If you think you have taken too much of this medicine contact a poison control center or emergency room at once. NOTE: This medicine is only for you. Do not share this medicine with others. What if I miss a dose? If you miss a dose, take it as soon as you can. If it is almost time for your next dose, take only that dose. Do not take double or extra doses. What may interact with this medicine? Do not take this medicine with any of the following medications: -teriflunomide This medicine may also interact with the following medications: -charcoal -cholestyramine -methotrexate -NSAIDs, medicines for pain and inflammation, like ibuprofen or naproxen -phenytoin -rifampin -tolbutamide -vaccines -warfarin This list may not describe all possible interactions. Give your health care provider a list of all the medicines, herbs, non-prescription drugs, or dietary  supplements you use. Also tell them if you smoke, drink alcohol, or use illegal drugs. Some items may interact with your medicine. What should I watch for while using this medicine? Visit your doctor or health care professional for regular checks on your progress. You will need frequent blood checks while you are receiving the medicine. If you get a cold or other infection while receiving this medicine, call your doctor or health care professional. Do not treat yourself. The medicine may increase your risk of getting an infection. If you are a woman who has the potential to become pregnant, discuss birth control options with your doctor or health care professional. Dennis Bast must not be pregnant, and you must be using a reliable form of birth control. The medicine may harm an unborn baby. Immediately call your doctor if you think you might be pregnant. Alcoholic drinks may increase possible damage to your liver. Do not drink alcohol while taking this medicine. What side effects may I notice from receiving this medicine? Side effects that you should report to your doctor or health care professional as soon as possible: -allergic reactions like skin rash, itching or hives, swelling of the face, lips, or tongue -cough -difficulty breathing or shortness of breath -fever, chills or any other sign of infection -redness, blistering, peeling or loosening of the skin, including inside the mouth -unusual bleeding or bruising -unusually weak or tired -vomiting -yellowing of eyes or skin Side effects that usually do not require medical attention (report to your doctor or health care professional if they continue or are bothersome): -diarrhea -hair loss -headache -nausea This list may not describe all possible side effects. Call your doctor for medical advice about side effects. You  may report side effects to FDA at 1-800-FDA-1088. Where should I keep my medicine? Keep out of the reach of children. Store at  room temperature between 15 and 30 degrees C (59 and 86 degrees F). Protect from moisture and light. Throw away any unused medicine after the expiration date. NOTE: This sheet is a summary. It may not cover all possible information. If you have questions about this medicine, talk to your doctor, pharmacist, or health care provider.  2018 Elsevier/Gold Standard (2013-10-26 10:53:11)

## 2017-03-20 NOTE — Telephone Encounter (Signed)
Patient called to check the status of Euflexxa injections. Please advise and let patient know.

## 2017-03-21 NOTE — Telephone Encounter (Signed)
Andrea,I've called the patient and left her detailed response to her question.In short, she can resume regular activities post the knee injection. She can advance activities as tolerated. She can shower/get it wet.

## 2017-03-24 DIAGNOSIS — H01004 Unspecified blepharitis left upper eyelid: Secondary | ICD-10-CM | POA: Diagnosis not present

## 2017-03-24 DIAGNOSIS — H1032 Unspecified acute conjunctivitis, left eye: Secondary | ICD-10-CM | POA: Diagnosis not present

## 2017-03-24 DIAGNOSIS — H01005 Unspecified blepharitis left lower eyelid: Secondary | ICD-10-CM | POA: Diagnosis not present

## 2017-03-25 NOTE — Telephone Encounter (Signed)
Please check. Thank you. 

## 2017-03-27 NOTE — Telephone Encounter (Signed)
IC [facility] and PA was never sent in to them, I have now faxed PA request to them for review.  Will followup on this.

## 2017-03-27 NOTE — Telephone Encounter (Signed)
IC pt and advised we are working on this one, I will call [facility] to f/u on auth requested already.

## 2017-04-14 ENCOUNTER — Other Ambulatory Visit: Payer: Self-pay | Admitting: Rheumatology

## 2017-04-14 MED ORDER — AMPHETAMINE-DEXTROAMPHET ER 20 MG PO CP24: 20.0000 mg | ORAL_CAPSULE | Freq: Every day | ORAL | 0 refills | Status: DC

## 2017-04-14 NOTE — Telephone Encounter (Signed)
Last Visit: 03/20/17 Next Visit: 08/20/17  Okay to refill Adderall?  

## 2017-04-14 NOTE — Telephone Encounter (Signed)
Patient called request a refill on her Adderall.  CB#[phone].  Thank you.

## 2017-04-15 ENCOUNTER — Other Ambulatory Visit: Payer: Self-pay | Admitting: Rheumatology

## 2017-04-15 NOTE — Telephone Encounter (Signed)
Last Visit: 03/20/17 Next Visit: 08/20/17 Labs: 12/27/16 WNL PLQ Eye Exam: 11/22/16 WNL  Okay to refill PLQ?

## 2017-04-15 NOTE — Telephone Encounter (Signed)
Ok to send if you have not already

## 2017-04-15 NOTE — Telephone Encounter (Signed)
I want to rewrite the Rx to 120 pills w/  1 refill but I can't edit the Rx.

## 2017-04-15 NOTE — Telephone Encounter (Signed)
Advised patient that she should do Plaquenil 200 in the morning, 200 at night; Monday through Friday only; none on Saturday or Sunday.I've written 420 pills with 1 refill.

## 2017-04-16 NOTE — Telephone Encounter (Signed)
IC [facility] again and s/w Ronnie, and did the PA on the phone this morning, this will come through [place], and they will call pt to discuss copay. I s/w Brooke and arranged office delivery info.  I will call pt to advise/update on this.  Approved through 05/07/2017, and injections can be done any time after that.

## 2017-04-18 ENCOUNTER — Telehealth: Payer: Self-pay | Admitting: Rheumatology

## 2017-04-18 NOTE — Telephone Encounter (Signed)
[place] left a message to scheduled delivery of the patient's Euflexxa.  CB#[phone].  Thank you

## 2017-04-21 NOTE — Telephone Encounter (Signed)
Please see message . Thank you .

## 2017-04-22 NOTE — Telephone Encounter (Signed)
Left message for patient, Mr. Erica Wood has appts. Available 04/24/17. Patient needs to have Euflexxa shipped to office. Patient left message on 04/21/17 wanted inj. Completed ASAP due to insurance.

## 2017-04-23 ENCOUNTER — Telehealth: Payer: Self-pay

## 2017-04-23 NOTE — Telephone Encounter (Signed)
PLease advise

## 2017-04-23 NOTE — Telephone Encounter (Signed)
Patient would like a call back from you concerning her Euflexxa Injections.  CB# is [phone].  Thank You

## 2017-04-24 NOTE — Telephone Encounter (Signed)
[facility] called and stated that Euflexxa Injection will be delivered on Monday 04/28/17.

## 2017-04-25 ENCOUNTER — Telehealth: Payer: Self-pay

## 2017-04-25 NOTE — Telephone Encounter (Signed)
Patient was calling to schedule appointments for Euflexxa Injections.  Advised patient that Euflexxa Injections will be delivered on Monday.  CB# is [phone].

## 2017-04-25 NOTE — Telephone Encounter (Signed)
Patient was calling to schedule appointments for Euflexxa,advised patient that Euflexxa injections will be delivered on Monday 04/28/17.

## 2017-04-28 NOTE — Telephone Encounter (Signed)
Please schedule with BP on Wednesdays.

## 2017-04-28 NOTE — Telephone Encounter (Signed)
LMOM for patient to call office to schedule Euflexxa appt w/Dr. Estanislado Pandy, Euflexxa delivered to office BIL x 3, patient purchased.

## 2017-04-29 NOTE — Telephone Encounter (Signed)
This is a Dr. Estanislado Pandy patient

## 2017-04-30 ENCOUNTER — Ambulatory Visit (INDEPENDENT_AMBULATORY_CARE_PROVIDER_SITE_OTHER): Payer: 59 | Admitting: Rheumatology

## 2017-04-30 DIAGNOSIS — M1712 Unilateral primary osteoarthritis, left knee: Secondary | ICD-10-CM

## 2017-04-30 DIAGNOSIS — M1711 Unilateral primary osteoarthritis, right knee: Secondary | ICD-10-CM | POA: Diagnosis not present

## 2017-04-30 DIAGNOSIS — M170 Bilateral primary osteoarthritis of knee: Secondary | ICD-10-CM

## 2017-04-30 MED ORDER — LIDOCAINE HCL 1 % IJ SOLN
1.5000 mL | INTRAMUSCULAR | Status: AC | PRN
  Administered 2017-04-30: 1.5 mL

## 2017-04-30 MED ORDER — SODIUM HYALURONATE (VISCOSUP) 20 MG/2ML IX SOSY
20.0000 mg | PREFILLED_SYRINGE | INTRA_ARTICULAR | Status: AC | PRN
  Administered 2017-04-30: 20 mg via INTRA_ARTICULAR

## 2017-04-30 NOTE — Addendum Note (Signed)
Addended by: Bo Merino on: 04/30/2017 09:59 AM   Modules accepted: Level of Service

## 2017-04-30 NOTE — Progress Notes (Signed)
   Procedure Note  Patient: Erica Wood             Date of Birth: 21-Jul-1966           MRN: [id number]             Visit Date: 04/30/2017  Procedures: Visit Diagnoses: No diagnosis found.  Large Joint Inj Date/Time: 04/30/2017 9:47 AM Performed by: Bo Merino Authorized by: Bo Merino   Consent Given by:  Patient Site marked: the procedure site was marked   Timeout: prior to procedure the correct patient, procedure, and site was verified   Indications:  Pain and joint swelling Location:  Knee Site:  R knee Prep: patient was prepped and draped in usual sterile fashion   Needle Size:  27 G Needle Length:  1.5 inches Approach:  Medial Ultrasound Guidance: No   Fluoroscopic Guidance: No   Arthrogram: No   Medications:  1.5 mL lidocaine 1 %; 20 mg Sodium Hyaluronate 20 MG/2ML Aspiration Attempted: Yes   Aspirate amount (mL):  0 Patient tolerance:  Patient tolerated the procedure well with no immediate complications Large Joint Inj Date/Time: 04/30/2017 9:55 AM Performed by: Bo Merino Authorized by: Bo Merino   Consent Given by:  Patient Site marked: the procedure site was marked   Timeout: prior to procedure the correct patient, procedure, and site was verified   Indications:  Pain and joint swelling Location:  Knee Site:  L knee Prep: patient was prepped and draped in usual sterile fashion   Needle Size:  27 G Needle Length:  1.5 inches Approach:  Medial Ultrasound Guidance: No   Fluoroscopic Guidance: No   Arthrogram: No   Medications:  1.5 mL lidocaine 1 %; 20 mg Sodium Hyaluronate 20 MG/2ML Aspiration Attempted: Yes   Aspirate amount (mL):  0 Patient tolerance:  Patient tolerated the procedure well with no immediate complications   Bo Merino, MD

## 2017-05-02 DIAGNOSIS — M170 Bilateral primary osteoarthritis of knee: Secondary | ICD-10-CM | POA: Insufficient documentation

## 2017-05-05 DIAGNOSIS — M1712 Unilateral primary osteoarthritis, left knee: Secondary | ICD-10-CM | POA: Diagnosis not present

## 2017-05-05 DIAGNOSIS — M1711 Unilateral primary osteoarthritis, right knee: Secondary | ICD-10-CM | POA: Diagnosis not present

## 2017-05-05 NOTE — Progress Notes (Signed)
   Procedure Note  Patient: Erica Wood             Date of Birth: 1966/05/14           MRN: [id number]             Visit Date: 05/07/2017  Procedures: Visit Diagnoses: Primary osteoarthritis of both knees - Plan: Large Joint Injection/Arthrocentesis, Large Joint Injection/Arthrocentesis Patient see her for second Euflexxa injection to bilateral knee joints Large Joint Inj Date/Time: 05/05/2017 3:16 PM Performed by: Candice Camp Authorized by: Bo Merino   Consent Given by:  Patient Site marked: the procedure site was marked   Timeout: prior to procedure the correct patient, procedure, and site was verified   Indications:  Pain and joint swelling Location:  Knee Site:  R knee Prep: patient was prepped and draped in usual sterile fashion   Needle Size:  27 G Needle Length:  1.5 inches Approach:  Medial Ultrasound Guidance: No   Fluoroscopic Guidance: No   Arthrogram: No   Aspiration Attempted: Yes   Patient tolerance:  Patient tolerated the procedure well with no immediate complications Large Joint Inj Date/Time: 05/05/2017 3:16 PM Performed by: Candice Camp Authorized by: Bo Merino   Consent Given by:  Patient Site marked: the procedure site was marked   Timeout: prior to procedure the correct patient, procedure, and site was verified   Indications:  Pain and joint swelling Location:  Knee Site:  L knee Prep: patient was prepped and draped in usual sterile fashion   Needle Size:  27 G Needle Length:  1.5 inches Approach:  Medial Ultrasound Guidance: No   Fluoroscopic Guidance: No   Arthrogram: No   Aspiration Attempted: Yes   Patient tolerance:  Patient tolerated the procedure well with no immediate complications   Bo Merino, MD

## 2017-05-07 ENCOUNTER — Ambulatory Visit (INDEPENDENT_AMBULATORY_CARE_PROVIDER_SITE_OTHER): Payer: 59 | Admitting: Rheumatology

## 2017-05-07 DIAGNOSIS — M170 Bilateral primary osteoarthritis of knee: Secondary | ICD-10-CM

## 2017-05-13 ENCOUNTER — Other Ambulatory Visit: Payer: Self-pay | Admitting: Rheumatology

## 2017-05-13 MED ORDER — AMPHETAMINE-DEXTROAMPHET ER 20 MG PO CP24: 20.0000 mg | ORAL_CAPSULE | Freq: Every day | ORAL | 0 refills | Status: DC

## 2017-05-13 NOTE — Telephone Encounter (Signed)
Patient called requesting a refill on her Adderall

## 2017-05-13 NOTE — Telephone Encounter (Signed)
Last Visit: 03/20/17 Next Visit: 08/20/17  Okay to refill Adderall?  

## 2017-05-15 DIAGNOSIS — M1711 Unilateral primary osteoarthritis, right knee: Secondary | ICD-10-CM | POA: Diagnosis not present

## 2017-05-15 DIAGNOSIS — M1712 Unilateral primary osteoarthritis, left knee: Secondary | ICD-10-CM

## 2017-05-15 NOTE — Progress Notes (Signed)
   Procedure Note  Patient: Erica Wood             Date of Birth: 05/17/1966           MRN: [id number]             Visit Date: 05/21/2017  Procedures: Visit Diagnoses: Primary osteoarthritis of both knees - Plan: Large Joint Injection/Arthrocentesis, Large Joint Injection/Arthrocentesis Euflexxa #3 bilateral knees (patient purchased) Large Joint Inj Date/Time: 05/15/2017 10:29 AM Performed by: Bo Merino Authorized by: Bo Merino   Consent Given by:  Patient Site marked: the procedure site was marked   Timeout: prior to procedure the correct patient, procedure, and site was verified   Indications:  Pain and joint swelling Location:  Knee Site:  R knee Prep: patient was prepped and draped in usual sterile fashion   Needle Size:  27 G Needle Length:  1.5 inches Approach:  Medial Ultrasound Guidance: No   Fluoroscopic Guidance: No   Arthrogram: No   Medications:  20 mg Sodium Hyaluronate 20 MG/2ML; 1.5 mL lidocaine 2 % Aspiration Attempted: Yes   Patient tolerance:  Patient tolerated the procedure well with no immediate complications Large Joint Inj Date/Time: 05/15/2017 10:31 AM Performed by: Bo Merino Authorized by: Bo Merino   Consent Given by:  Patient Site marked: the procedure site was marked   Timeout: prior to procedure the correct patient, procedure, and site was verified   Location:  Knee Site:  L knee Prep: patient was prepped and draped in usual sterile fashion   Needle Size:  27 G Needle Length:  1.5 inches Ultrasound Guidance: No   Fluoroscopic Guidance: No   Arthrogram: No   Medications:  1.5 mL lidocaine 2 % Aspiration Attempted: Yes   Patient tolerance:  Patient tolerated the procedure well with no immediate complications   Bo Merino, MD

## 2017-05-21 ENCOUNTER — Ambulatory Visit (INDEPENDENT_AMBULATORY_CARE_PROVIDER_SITE_OTHER): Payer: 59 | Admitting: Rheumatology

## 2017-05-21 DIAGNOSIS — M170 Bilateral primary osteoarthritis of knee: Secondary | ICD-10-CM

## 2017-05-21 MED ORDER — LIDOCAINE HCL 2 % IJ SOLN
1.5000 mL | INTRAMUSCULAR | Status: AC | PRN
  Administered 2017-05-15: 1.5 mL

## 2017-05-21 MED ORDER — SODIUM HYALURONATE (VISCOSUP) 20 MG/2ML IX SOSY
20.0000 mg | PREFILLED_SYRINGE | INTRA_ARTICULAR | Status: AC | PRN
  Administered 2017-05-15: 20 mg via INTRA_ARTICULAR

## 2017-06-12 ENCOUNTER — Other Ambulatory Visit: Payer: Self-pay | Admitting: Rheumatology

## 2017-06-12 DIAGNOSIS — Z79899 Other long term (current) drug therapy: Secondary | ICD-10-CM

## 2017-06-12 MED ORDER — AMPHETAMINE-DEXTROAMPHET ER 20 MG PO CP24: 20.0000 mg | ORAL_CAPSULE | Freq: Every day | ORAL | 0 refills | Status: DC

## 2017-06-12 NOTE — Telephone Encounter (Signed)
Patient advised prescription is ready for pick up.  

## 2017-06-12 NOTE — Telephone Encounter (Signed)
Put in the lab orders for her to have drawn at [facility]  Please advise on refill of Adderall.  Last Rx 05/13/17 Next visit 08/20/17

## 2017-06-12 NOTE — Telephone Encounter (Signed)
ok 

## 2017-06-12 NOTE — Telephone Encounter (Signed)
Patient is requesting refill of adderall.  She is also needing a standing lab order. She works for [employer] and has her labs drawn there.

## 2017-06-12 NOTE — Telephone Encounter (Signed)
Printed it, will call when signed by Dr Estanislado Pandy.

## 2017-07-08 DIAGNOSIS — L930 Discoid lupus erythematosus: Secondary | ICD-10-CM | POA: Diagnosis not present

## 2017-07-08 DIAGNOSIS — I739 Peripheral vascular disease, unspecified: Secondary | ICD-10-CM | POA: Diagnosis not present

## 2017-07-16 ENCOUNTER — Other Ambulatory Visit: Payer: Self-pay | Admitting: Rheumatology

## 2017-07-16 NOTE — Telephone Encounter (Signed)
Patient is requesting refill of adderall.

## 2017-07-17 MED ORDER — AMPHETAMINE-DEXTROAMPHET ER 20 MG PO CP24: 20.0000 mg | ORAL_CAPSULE | Freq: Every day | ORAL | 0 refills | Status: DC

## 2017-07-17 NOTE — Telephone Encounter (Signed)
ok 

## 2017-07-17 NOTE — Telephone Encounter (Signed)
Last Visit: 03/20/17 Next Visit: 08/20/17  Okay to refill Adderall?

## 2017-08-12 NOTE — Progress Notes (Signed)
Office Visit Note  Patient: Erica Wood             Date of Birth: 1966-08-01           MRN: [id number]             PCP: Shon Baton, MD Referring: Shon Baton, MD Visit Date: 08/20/2017 Occupation: _0 @    Subjective:  Pain in multiple joints   History of Present Illness: Erica Wood is a 51 y.o. female with history of autoimmune disease and osteoarthritis. She states she continues to have pain and discomfort in her bilateral hands, bilateral knee joints and her lower back. She continues to have symptoms of Raynauds.  Activities of Daily Living:  Patient reports morning stiffness for 30 minutes.   Patient Reports nocturnal pain.  Difficulty dressing/grooming: Denies Difficulty climbing stairs: Reports Difficulty getting out of chair: Denies Difficulty using hands for taps, buttons, cutlery, and/or writing: Denies   Review of Systems  Constitutional: Positive for fatigue. Negative for activity change, night sweats, weight gain, weight loss and weakness.  HENT: Negative for mouth sores, trouble swallowing, trouble swallowing, mouth dryness and nose dryness.   Eyes: Negative for pain, redness, visual disturbance and dryness.  Respiratory: Negative for cough, shortness of breath and difficulty breathing.   Cardiovascular: Negative for chest pain, palpitations, hypertension, irregular heartbeat and swelling in legs/feet.  Gastrointestinal: Positive for constipation. Negative for abdominal pain, blood in stool and diarrhea.  Endocrine: Negative for cold intolerance and increased urination.  Genitourinary: Negative for painful urination and vaginal dryness.  Musculoskeletal: Positive for arthralgias, joint pain, muscle weakness and morning stiffness. Negative for joint swelling, myalgias, muscle tenderness and myalgias.  Skin: Positive for color change. Negative for rash, hair loss, skin tightness, ulcers and sensitivity to sunlight.  Allergic/Immunologic: Negative for  susceptible to infections.  Neurological: Positive for headaches. Negative for dizziness, numbness, memory loss and night sweats.  Hematological: Negative for bruising/bleeding tendency and swollen glands.  Psychiatric/Behavioral: Negative for depressed mood and sleep disturbance. The patient is not nervous/anxious.     PMFS History:  Patient Active Problem List   Diagnosis Date Noted  . Primary osteoarthritis of both knees 05/02/2017  . Raynaud's disease without gangrene 11/26/2016  . 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 Anticardiolipin antibody positive 11/26/2016  . Autoimmune disease ([place]) 11/25/2016  . High risk medication use 11/25/2016  . Primary osteoarthritis of both hands 11/25/2016  . History of DVT (deep vein thrombosis) 11/25/2016  . History of pulmonary embolism 11/25/2016  . Primary insomnia 11/25/2016  . Anxiety 11/25/2016  . Irritable bowel syndrome with constipation 11/25/2016  . Varicose veins of lower extremities with other complications 33/54/5625  . Spider veins of both lower extremities 05/24/2014    Past Medical History:  Diagnosis Date  . Anti-cardiolipin antibody positive   . Anti-cardiolipin antibody syndrome (HCC)   . Anxiety   . Arthralgia   . Atypical chest pain   . Autoimmune disease ([place])   . Chronic fatigue   . DVT (deep venous thrombosis) ([place])   . Pulmonary embolus ([place])   . Raynaud's syndrome   . Sicca ([place])   . Thrombocytopenia ([place])     History reviewed. No pertinent family history. Past Surgical History:  Procedure Laterality Date  . APPENDECTOMY    . COLON SURGERY     Perforated colon repair after colostomy  . GANGLION CYST EXCISION    . HYSTEROSCOPY W/D&C N/A 01/13/2014   Procedure: DILATATION AND CURETTAGE /HYSTEROSCOPY with resection ;  Surgeon: Olga Millers, MD;  Location: [facility];  Service: Gynecology;  Laterality: N/A;  . LAPAROTOMY     Social History   Social History Narrative  . No narrative on file     Objective: Vital  Signs: BP (!) 100/45 (BP Location: Left Arm, Patient Position: Sitting, Cuff Size: Normal)   Pulse 65   Resp 13   Ht _0  (1.626 m)   Wt 112 lb (50.8 kg)   LMP  (LMP Unknown)   BMI 19.22 kg/m    Physical Exam  Constitutional: She is oriented to person, place, and time. She appears well-developed and well-nourished.  HENT:  Head: Normocephalic and atraumatic.  Eyes: Conjunctivae and EOM are normal.  Neck: Normal range of motion.  Cardiovascular: Normal rate, regular rhythm, normal heart sounds and intact distal pulses.   Pulmonary/Chest: Effort normal and breath sounds normal.  Abdominal: Soft. Bowel sounds are normal.  Lymphadenopathy:    She has no cervical adenopathy.  Neurological: She is alert and oriented to person, place, and time.  Skin: Skin is warm and dry. Capillary refill takes less than 2 seconds.  Psychiatric: She has a normal mood and affect. Her behavior is normal.  Nursing note and vitals reviewed.    Musculoskeletal Exam: C-spine and thoracic lumbar spine good range of motion. Shoulder joints elbow joints wrist joints with good range of motion. She appears to have some synovial thickening and tenderness over bilateral second and third MCP joint. PIP/DIP thickening was also noted. Hip joints knee joints are good range of motion. She has discomfort range of motion of her knee joints without any warmth swelling or effusion. She has thickening of her bilateral first MTP joints consistent with osteoarthritis. No synovitis or tenderness was noted.  CDAI Exam: CDAI Homunculus Exam:   Tenderness:  Right hand: 2nd MCP and 3rd MCP Left hand: 2nd MCP and 3rd MCP RLE: tibiofemoral LLE: tibiofemoral  Joint Counts:  CDAI Tender Joint count: 6 CDAI Swollen Joint count: 0  Global Assessments:  Patient Global Assessment: 5 Provider Global Assessment: 3  CDAI Calculated Score: 14    Investigation: Findings:  11/22/2016 Eye exam/ visual field shows no evidence of  Plaquenil toxicity  CBC Latest Ref Rng & Units 12/27/2016 01/11/2014  WBC 3.4 - 10.8 x10E3/uL 8.7 6.0  Hemoglobin 11.1 - 15.9 g/dL 15.8 13.8  Hematocrit 34.0 - 46.6 % 47.1(H) 41.0  Platelets 150 - 379 x10E3/uL 240 180   CMP Latest Ref Rng & Units 12/27/2016  Glucose 65 - 99 mg/dL 94  BUN 6 - 24 mg/dL 9  Creatinine 0.57 - 1.00 mg/dL 0.65  Sodium 134 - 144 mmol/L 137  Potassium 3.5 - 5.2 mmol/L 4.5  Chloride 96 - 106 mmol/L 98  CO2 18 - 29 mmol/L 25  Calcium 8.7 - 10.2 mg/dL 8.9  Total Protein 6.0 - 8.5 g/dL 6.8  Total Bilirubin 0.0 - 1.2 mg/dL 0.8  Alkaline Phos 39 - 117 IU/L 55  AST 0 - 40 IU/L 19  ALT 0 - 32 IU/L 15    Imaging: Xr Hand 2 View Left  Result Date: 08/20/2017 Juxta-articular osteopenia noted. Third MCP narrowing and all PIP joint narrowing was noted. These findings are consistent with inflammatory arthritis and osteoarthritis overlap.  Xr Hand 2 View Right  Result Date: 08/20/2017 Juxta-articular osteopenia noted. Third MCP narrowing and all PIP joint narrowing was noted. These findings are consistent with inflammatory arthritis and osteoarthritis overlap.   Speciality Comments: No specialty comments available.  Procedures:  No procedures performed Allergies: Beef-derived products; Latex; and Sulfa antibiotics   Assessment / Plan:     Visit Diagnoses: Autoimmune disease ([place]) -  positive ANA and positive double-stranded DNA . She continues to have some arthralgias and fatigue. Her Raynauds is still active. She is on Plaquenil which she is tolerating well.  Discoid lupus: She has had no recent recurrence of discoid lupus.  High risk medication use - Plaquenil 200 by mouth twice a day Monday through Friday. Her eye exam will be due in January. Her labs have been stable.  Pain in both hands -she's been experiencing increased pain in her hands and has some synovial thickening over bilateral second and third MCP joint. Plan: XR Hand 2 View Right, XR Hand 2  View Left. X-ray showed narrowing and bilateral third MCPs. I will obtain ultrasound to look for synovitis. If she has active synovitis she many more aggressive therapy.  Raynaud's disease without gangrene: Her symptoms are stable on aspirin.  Primary osteoarthritis of both hands: Joint protection and muscle strengthening discussed.  Primary osteoarthritis of both knees: She has ongoing pain.  Anticardiolipin antibody positive: Her last anticardiolipin antibodies were negligible.  History of DVT (deep vein thrombosis) - on xarelto per her PCP.  History of pulmonary embolism  History of IBS    Orders: Orders Placed This Encounter  Procedures  . XR Hand 2 View Right  . XR Hand 2 View Left  . CBC with Differential/Platelet  . Urinalysis, Routine w reflex microscopic  . Sedimentation rate  . Anti-DNA antibody, double-stranded  . C3 and C4  . VITAMIN D 25 Hydroxy (Vit-D Deficiency, Fractures)  . Cardiolipin antibodies, IgG, IgM, IgA  . CMP14+EGFR   No orders of the defined types were placed in this encounter.   Face-to-face time spent with patient was 30 minutes. Greater than 50% of time was spent in counseling and coordination of care.  Follow-Up Instructions: Return in about 5 months (around 01/18/2018) for Autoimmune disease, Osteoarthritis.   Bo Merino, MD  Note - This record has been created using Editor, commissioning.  Chart creation errors have been sought, but may not always  have been located. Such creation errors do not reflect on  the standard of medical care.

## 2017-08-14 ENCOUNTER — Other Ambulatory Visit: Payer: Self-pay | Admitting: Rheumatology

## 2017-08-14 MED ORDER — AMPHETAMINE-DEXTROAMPHET ER 20 MG PO CP24: 20.0000 mg | ORAL_CAPSULE | Freq: Every day | ORAL | 0 refills | Status: DC

## 2017-08-14 NOTE — Telephone Encounter (Signed)
ok 

## 2017-08-14 NOTE — Telephone Encounter (Signed)
Last Rx 05/13/17 Next visit 08/20/17  Okay to refill Adderall?

## 2017-08-14 NOTE — Telephone Encounter (Signed)
Patient request refill on Adderal. Please call when ready to pick up.

## 2017-08-20 ENCOUNTER — Ambulatory Visit (INDEPENDENT_AMBULATORY_CARE_PROVIDER_SITE_OTHER): Payer: 59

## 2017-08-20 ENCOUNTER — Encounter: Payer: Self-pay | Admitting: Rheumatology

## 2017-08-20 ENCOUNTER — Ambulatory Visit (INDEPENDENT_AMBULATORY_CARE_PROVIDER_SITE_OTHER): Payer: 59 | Admitting: Rheumatology

## 2017-08-20 VITALS — BP 100/45 | HR 65 | Resp 13 | Ht 64.0 in | Wt 112.0 lb

## 2017-08-20 DIAGNOSIS — M19042 Primary osteoarthritis, left hand: Secondary | ICD-10-CM

## 2017-08-20 DIAGNOSIS — M79641 Pain in right hand: Secondary | ICD-10-CM

## 2017-08-20 DIAGNOSIS — M79642 Pain in left hand: Secondary | ICD-10-CM

## 2017-08-20 DIAGNOSIS — M19041 Primary osteoarthritis, right hand: Secondary | ICD-10-CM

## 2017-08-20 DIAGNOSIS — M359 Systemic involvement of connective tissue, unspecified: Secondary | ICD-10-CM

## 2017-08-20 DIAGNOSIS — Z8719 Personal history of other diseases of the digestive system: Secondary | ICD-10-CM

## 2017-08-20 DIAGNOSIS — Z86711 Personal history of pulmonary embolism: Secondary | ICD-10-CM

## 2017-08-20 DIAGNOSIS — D8989 Other specified disorders involving the immune mechanism, not elsewhere classified: Secondary | ICD-10-CM | POA: Diagnosis not present

## 2017-08-20 DIAGNOSIS — Z86718 Personal history of other venous thrombosis and embolism: Secondary | ICD-10-CM

## 2017-08-20 DIAGNOSIS — M170 Bilateral primary osteoarthritis of knee: Secondary | ICD-10-CM | POA: Diagnosis not present

## 2017-08-20 DIAGNOSIS — I7300 Raynaud's syndrome without gangrene: Secondary | ICD-10-CM

## 2017-08-20 DIAGNOSIS — Z79899 Other long term (current) drug therapy: Secondary | ICD-10-CM | POA: Diagnosis not present

## 2017-08-20 DIAGNOSIS — R768 Other specified abnormal immunological findings in serum: Secondary | ICD-10-CM

## 2017-08-20 DIAGNOSIS — L930 Discoid lupus erythematosus: Secondary | ICD-10-CM | POA: Diagnosis not present

## 2017-08-20 DIAGNOSIS — R760 Raised antibody titer: Secondary | ICD-10-CM

## 2017-09-04 DIAGNOSIS — D8989 Other specified disorders involving the immune mechanism, not elsewhere classified: Secondary | ICD-10-CM | POA: Diagnosis not present

## 2017-09-04 DIAGNOSIS — Z79899 Other long term (current) drug therapy: Secondary | ICD-10-CM | POA: Diagnosis not present

## 2017-09-04 DIAGNOSIS — L930 Discoid lupus erythematosus: Secondary | ICD-10-CM | POA: Diagnosis not present

## 2017-09-05 NOTE — Progress Notes (Signed)
Labs are stable. No change in treatment

## 2017-09-09 LAB — CBC WITH DIFFERENTIAL/PLATELET
BASOS: 1 %
Basophils Absolute: 0.1 10*3/uL (ref 0.0–0.2)
EOS (ABSOLUTE): 0 10*3/uL (ref 0.0–0.4)
EOS: 0 %
HEMATOCRIT: 43.5 % (ref 34.0–46.6)
HEMOGLOBIN: 14.1 g/dL (ref 11.1–15.9)
IMMATURE GRANS (ABS): 0 10*3/uL (ref 0.0–0.1)
IMMATURE GRANULOCYTES: 0 %
Lymphocytes Absolute: 2 10*3/uL (ref 0.7–3.1)
Lymphs: 25 %
MCH: 30.5 pg (ref 26.6–33.0)
MCHC: 32.4 g/dL (ref 31.5–35.7)
MCV: 94 fL (ref 79–97)
MONOCYTES: 6 %
Monocytes Absolute: 0.5 10*3/uL (ref 0.1–0.9)
NEUTROS PCT: 68 %
Neutrophils Absolute: 5.5 10*3/uL (ref 1.4–7.0)
Platelets: 164 10*3/uL (ref 150–379)
RBC: 4.63 x10E6/uL (ref 3.77–5.28)
RDW: 12.1 % — ABNORMAL LOW (ref 12.3–15.4)
WBC: 8.1 10*3/uL (ref 3.4–10.8)

## 2017-09-09 LAB — CMP14+EGFR
ALBUMIN: 4.3 g/dL (ref 3.5–5.5)
ALT: 13 IU/L (ref 0–32)
AST: 20 IU/L (ref 0–40)
Albumin/Globulin Ratio: 2.2 (ref 1.2–2.2)
Alkaline Phosphatase: 62 IU/L (ref 39–117)
BUN / CREAT RATIO: 9 (ref 9–23)
BUN: 7 mg/dL (ref 6–24)
Bilirubin Total: 0.3 mg/dL (ref 0.0–1.2)
CALCIUM: 8.7 mg/dL (ref 8.7–10.2)
CO2: 25 mmol/L (ref 20–29)
CREATININE: 0.74 mg/dL (ref 0.57–1.00)
Chloride: 102 mmol/L (ref 96–106)
GFR, EST AFRICAN AMERICAN: 110 mL/min/{1.73_m2} (ref >59)
GFR, EST NON AFRICAN AMERICAN: 95 mL/min/{1.73_m2} (ref >59)
GLOBULIN, TOTAL: 2 g/dL (ref 1.5–4.5)
Glucose: 92 mg/dL (ref 65–99)
Potassium: 3.8 mmol/L (ref 3.5–5.2)
SODIUM: 140 mmol/L (ref 134–144)
TOTAL PROTEIN: 6.3 g/dL (ref 6.0–8.5)

## 2017-09-09 LAB — C3 AND C4
COMPLEMENT C3, SERUM: 77 mg/dL — AB (ref 82–167)
COMPLEMENT C4, SERUM: 15 mg/dL (ref 14–44)

## 2017-09-09 LAB — URINALYSIS, ROUTINE W REFLEX MICROSCOPIC
BILIRUBIN UA: NEGATIVE
Glucose, UA: NEGATIVE
Ketones, UA: NEGATIVE
Leukocytes, UA: NEGATIVE
NITRITE UA: NEGATIVE
PH UA: 5.5 (ref 5.0–7.5)
PROTEIN UA: NEGATIVE
RBC UA: NEGATIVE
Specific Gravity, UA: 1.012 (ref 1.005–1.030)
UUROB: 0.2 mg/dL (ref 0.2–1.0)

## 2017-09-09 LAB — VITAMIN D 25 HYDROXY (VIT D DEFICIENCY, FRACTURES): Vit D, 25-Hydroxy: 31.9 ng/mL (ref 30.0–100.0)

## 2017-09-09 LAB — CARDIOLIPIN ANTIBODIES, IGG, IGM, IGA
Anticardiolipin IgG: <9 GPL U/mL (ref 0–14)
Anticardiolipin IgM: 12 MPL U/mL (ref 0–12)

## 2017-09-09 LAB — SEDIMENTATION RATE: SED RATE: 2 mm/h (ref 0–32)

## 2017-09-09 LAB — ANTI-DNA ANTIBODY, DOUBLE-STRANDED: DSDNA AB: 2 [IU]/mL (ref 0–9)

## 2017-09-15 ENCOUNTER — Other Ambulatory Visit: Payer: Self-pay

## 2017-09-15 ENCOUNTER — Telehealth: Payer: Self-pay | Admitting: Rheumatology

## 2017-09-15 MED ORDER — AMPHETAMINE-DEXTROAMPHET ER 20 MG PO CP24: 20.0000 mg | ORAL_CAPSULE | Freq: Every day | ORAL | 0 refills | Status: DC

## 2017-09-15 NOTE — Telephone Encounter (Signed)
Last Visit: 08/20/17 Next Visit: 01/07/18  Okay to refill Adderall?

## 2017-09-15 NOTE — Telephone Encounter (Signed)
Patient request refill on Adderal. Please call when ready to pick up.

## 2017-09-15 NOTE — Telephone Encounter (Signed)
Patient would like a Rx refill on Adderall.  Cb# is [phone].  Please advise.  Thank You.

## 2017-09-15 NOTE — Addendum Note (Signed)
Addended by: Carole Binning on: 09/15/2017 10:00 AM   Modules accepted: Orders

## 2017-09-15 NOTE — Telephone Encounter (Signed)
See previous phone note.  

## 2017-10-15 ENCOUNTER — Other Ambulatory Visit: Payer: Self-pay | Admitting: Rheumatology

## 2017-10-15 MED ORDER — AMPHETAMINE-DEXTROAMPHET ER 20 MG PO CP24: 20.0000 mg | ORAL_CAPSULE | Freq: Every day | ORAL | 0 refills | Status: DC

## 2017-10-15 NOTE — Telephone Encounter (Signed)
Last Visit: 08/20/17 Next Visit: 01/07/18  Okay to refill Adderall?

## 2017-10-15 NOTE — Telephone Encounter (Signed)
ok 

## 2017-10-15 NOTE — Telephone Encounter (Signed)
Patient called requesting an RX Refill for her Adderall.  CB#[phone].  Thank you.

## 2017-11-13 ENCOUNTER — Other Ambulatory Visit: Payer: Self-pay | Admitting: Rheumatology

## 2017-11-13 MED ORDER — AMPHETAMINE-DEXTROAMPHET ER 20 MG PO CP24: 20.0000 mg | ORAL_CAPSULE | Freq: Every day | ORAL | 0 refills | Status: DC

## 2017-11-13 NOTE — Telephone Encounter (Signed)
Last Visit: 08/20/17 Next Visit: 01/07/18  Okay to refill Adderall?

## 2017-11-13 NOTE — Telephone Encounter (Signed)
Patient called requesting a prescription refill of her Adderall XR 20 mg.  CB# [phone]

## 2017-11-13 NOTE — Telephone Encounter (Signed)
ok 

## 2017-12-03 DIAGNOSIS — H524 Presbyopia: Secondary | ICD-10-CM | POA: Diagnosis not present

## 2017-12-03 DIAGNOSIS — Z79899 Other long term (current) drug therapy: Secondary | ICD-10-CM | POA: Diagnosis not present

## 2017-12-03 DIAGNOSIS — H25043 Posterior subcapsular polar age-related cataract, bilateral: Secondary | ICD-10-CM | POA: Diagnosis not present

## 2017-12-03 DIAGNOSIS — H0100A Unspecified blepharitis right eye, upper and lower eyelids: Secondary | ICD-10-CM | POA: Diagnosis not present

## 2017-12-16 ENCOUNTER — Telehealth: Payer: Self-pay | Admitting: Rheumatology

## 2017-12-16 MED ORDER — AMPHETAMINE-DEXTROAMPHET ER 20 MG PO CP24: 20.0000 mg | ORAL_CAPSULE | Freq: Every day | ORAL | 0 refills | Status: DC

## 2017-12-16 NOTE — Telephone Encounter (Signed)
Last visit: 08/20/2017 Next visit: 01/07/2018  Last fill: 11/13/2017  Okay to refill adderall?

## 2017-12-16 NOTE — Telephone Encounter (Signed)
Called patient and advised patient that prescription is at the front desk ready to be picked up.

## 2017-12-16 NOTE — Telephone Encounter (Signed)
Ok to refill 30-day supply

## 2017-12-16 NOTE — Telephone Encounter (Signed)
Patient request a refill on Adderal. Please call when ready to pick up.

## 2018-01-06 DIAGNOSIS — Z0000 Encounter for general adult medical examination without abnormal findings: Secondary | ICD-10-CM | POA: Diagnosis not present

## 2018-01-07 ENCOUNTER — Other Ambulatory Visit: Payer: 59 | Admitting: Rheumatology

## 2018-01-08 DIAGNOSIS — Z8672 Personal history of thrombophlebitis: Secondary | ICD-10-CM | POA: Diagnosis not present

## 2018-01-08 DIAGNOSIS — Z86711 Personal history of pulmonary embolism: Secondary | ICD-10-CM | POA: Diagnosis not present

## 2018-01-08 DIAGNOSIS — Z0000 Encounter for general adult medical examination without abnormal findings: Secondary | ICD-10-CM | POA: Diagnosis not present

## 2018-01-08 DIAGNOSIS — Z1389 Encounter for screening for other disorder: Secondary | ICD-10-CM | POA: Diagnosis not present

## 2018-01-08 NOTE — Progress Notes (Signed)
Office Visit Note  Patient: Erica Wood             Date of Birth: June 16, 1966           MRN: [id number]             PCP: Shon Baton, MD Referring: Shon Baton, MD Visit Date: 01/21/2018 Occupation: _0 @    Subjective:  Medication Management   History of Present Illness: Erica Wood is a 52 y.o. female with history of autoimmune disease, osteoarthritis.  She continues to have some discomfort in her hands and knee joints.  She states that Visco supplement injections were helpful.  She continues to have Raynauds phenomenon during winter.  She denies any digital ulcers.  Activities of Daily Living:  Patient reports morning stiffness for 15-30 minutes.   Patient Reports nocturnal pain.  Difficulty dressing/grooming: Denies Difficulty climbing stairs: Reports Difficulty getting out of chair: Denies Difficulty using hands for taps, buttons, cutlery, and/or writing: Reports   Review of Systems  Constitutional: Positive for fatigue. Negative for night sweats, weight gain, weight loss and weakness.  HENT: Positive for mouth dryness. Negative for mouth sores, trouble swallowing, trouble swallowing and nose dryness.   Eyes: Negative for pain, redness, visual disturbance and dryness.  Respiratory: Negative for cough, shortness of breath and difficulty breathing.        When anxious   Cardiovascular: Negative for chest pain, palpitations, hypertension, irregular heartbeat and swelling in legs/feet.  Gastrointestinal: Positive for constipation. Negative for blood in stool, diarrhea and nausea.  Endocrine: Negative for increased urination.  Genitourinary: Negative for pelvic pain and vaginal dryness.  Musculoskeletal: Positive for arthralgias, joint pain and morning stiffness. Negative for joint swelling, myalgias, muscle weakness, muscle tenderness and myalgias.  Skin: Positive for hair loss. Negative for color change, rash, redness, skin tightness, ulcers and sensitivity to  sunlight.  Allergic/Immunologic: Negative for susceptible to infections.  Neurological: Negative for dizziness, headaches, memory loss and night sweats.  Hematological: Negative for bruising/bleeding tendency and swollen glands.  Psychiatric/Behavioral: Negative for depressed mood and sleep disturbance. The patient is nervous/anxious.     PMFS History:  Patient Active Problem List   Diagnosis Date Noted  . Primary osteoarthritis of both knees 05/02/2017  . Raynaud's disease without gangrene 11/26/2016  . 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 Anticardiolipin antibody positive 11/26/2016  . Autoimmune disease ([place]) 11/25/2016  . High risk medication use 11/25/2016  . Primary osteoarthritis of both hands 11/25/2016  . History of DVT (deep vein thrombosis) 11/25/2016  . History of pulmonary embolism 11/25/2016  . Primary insomnia 11/25/2016  . Anxiety 11/25/2016  . Irritable bowel syndrome with constipation 11/25/2016  . Varicose veins of lower extremities with other complications 53/97/6734  . Spider veins of both lower extremities 05/24/2014    Past Medical History:  Diagnosis Date  . Anti-cardiolipin antibody positive   . Anti-cardiolipin antibody syndrome (HCC)   . Anxiety   . Arthralgia   . Atypical chest pain   . Autoimmune disease ([place])   . Chronic fatigue   . DVT (deep venous thrombosis) ([place])   . Pulmonary embolus ([place])   . Raynaud's syndrome   . Sicca ([place])   . Thrombocytopenia ([place])     Family History  Problem Relation Age of Onset  . Rheum arthritis Father   . Cancer Sister        bone    Past Surgical History:  Procedure Laterality Date  . APPENDECTOMY    . COLON  SURGERY     Perforated colon repair after colostomy  . GANGLION CYST EXCISION    . HYSTEROSCOPY W/D&C N/A 01/13/2014   Procedure: DILATATION AND CURETTAGE /HYSTEROSCOPY with resection ;  Surgeon: Olga Millers, MD;  Location: [facility];  Service: Gynecology;  Laterality: N/A;  . LAPAROTOMY     Social  History   Social History Narrative  . Not on file     Objective: Vital Signs: BP 101/65 (BP Location: Left Arm, Patient Position: Sitting, Cuff Size: Normal)   Pulse 68   Ht 5' 4" (1.626 m)   Wt 112 lb (50.8 kg)   BMI 19.22 kg/m    Physical Exam  Constitutional: She is oriented to person, place, and time. She appears well-developed and well-nourished.  HENT:  Head: Normocephalic and atraumatic.  Eyes: Conjunctivae and EOM are normal.  Neck: Normal range of motion.  Cardiovascular: Normal rate, regular rhythm, normal heart sounds and intact distal pulses.  Pulmonary/Chest: Effort normal and breath sounds normal.  Abdominal: Soft. Bowel sounds are normal.  Lymphadenopathy:    She has no cervical adenopathy.  Neurological: She is alert and oriented to person, place, and time.  Skin: Skin is warm and dry. Capillary refill takes less than 2 seconds.  She has hyperemia in her hands which were cold to touch but had good capillary refill.  Psychiatric: She has a normal mood and affect. Her behavior is normal.  Nursing note and vitals reviewed.    Musculoskeletal Exam: C-spine thoracic lumbar spine good range of motion.  Shoulder joints elbows joints and wrist joints are good range of motion.  She has DIP PIP thickening in bilateral hands.  She has some crepitus in her knee joints but no warmth swelling or effusion was noted.  Hip joints knee joints ankles with good range of motion.  CDAI Exam: CDAI Homunculus Exam:   Joint Counts:  CDAI Tender Joint count: 0 CDAI Swollen Joint count: 0     Investigation: No additional findings. CBC Latest Ref Rng & Units 09/04/2017 12/27/2016 01/11/2014  WBC 3.4 - 10.8 x10E3/uL 8.1 8.7 6.0  Hemoglobin 11.1 - 15.9 g/dL 14.1 15.8 13.8  Hematocrit 34.0 - 46.6 % 43.5 47.1(H) 41.0  Platelets 150 - 379 x10E3/uL 164 240 180   CMP Latest Ref Rng & Units 09/04/2017 12/27/2016  Glucose 65 - 99 mg/dL 92 94  BUN 6 - 24 mg/dL 7 9  Creatinine 0.57 -  1.00 mg/dL 0.74 0.65  Sodium 134 - 144 mmol/L 140 137  Potassium 3.5 - 5.2 mmol/L 3.8 4.5  Chloride 96 - 106 mmol/L 102 98  CO2 20 - 29 mmol/L 25 25  Calcium 8.7 - 10.2 mg/dL 8.7 8.9  Total Protein 6.0 - 8.5 g/dL 6.3 6.8  Total Bilirubin 0.0 - 1.2 mg/dL 0.3 0.8  Alkaline Phos 39 - 117 IU/L 62 55  AST 0 - 40 IU/L 20 19  ALT 0 - 32 IU/L 13 15    Imaging: No results found.  Speciality Comments: No specialty comments available.    Procedures:  No procedures performed Allergies: Beef-derived products; Latex; and Sulfa antibiotics   Assessment / Plan:     Visit Diagnoses: Autoimmune disease ([place]) - positive ANA and positive double-stranded DNA .  Patient is clinically doing well without any synovitis on examination.  She still have some Raynauds.  I will obtain following labs to monitor autoimmune process.- Plan: Anti-DNA antibody, double-stranded, C3 and C4, Sedimentation rate, VITAMIN D 25 Hydroxy (Vit-D Deficiency, Fractures),  Urinalysis, Routine w reflex microscopic, CBC with Differential/Platelet, CMP14+EGFR  Discoid lupus -patient had no recent rash.  High risk medication use - Plaquenil 200 by mouth twice a day Monday through Friday.  Patient states her last eye exam was in January.  CBC with Differential/Platelet, CMP14+EGFR today and then every 5 months.  Raynaud's disease without gangrene: Protective clothing and warm core temperature was discussed.  Primary osteoarthritis of both hands: Joint protection muscle strengthening discussed.  Primary osteoarthritis of both knees: She continues to have some discomfort in her knee joints.  Anticardiolipin antibody positive - Her last anticardiolipin antibodies were negligible.  Patient states that she has been getting DEXA through her PCP.  History of IBS: She continues to have GI discomfort.  History of DVT (deep vein thrombosis) - on xarelto per her PCP  History of pulmonary embolism  Primary insomnia  History of  anxiety    Orders: Orders Placed This Encounter  Procedures  . Anti-DNA antibody, double-stranded  . C3 and C4  . Sedimentation rate  . VITAMIN D 25 Hydroxy (Vit-D Deficiency, Fractures)  . Urinalysis, Routine w reflex microscopic  . CBC with Differential/Platelet  . CMP14+EGFR   No orders of the defined types were placed in this encounter.   Face-to-face time spent with patient was 30 minutes.  Greater than 50% of time was spent in counseling and coordination of care.  Follow-Up Instructions: Return in about 6 months (around 07/24/2018) for Autoimmune disease, Osteoarthritis.   Bo Merino, MD  Note - This record has been created using Editor, commissioning.  Chart creation errors have been sought, but may not always  have been located. Such creation errors do not reflect on  the standard of medical care.

## 2018-01-16 ENCOUNTER — Telehealth: Payer: Self-pay | Admitting: Rheumatology

## 2018-01-16 MED ORDER — AMPHETAMINE-DEXTROAMPHET ER 20 MG PO CP24: 20.0000 mg | ORAL_CAPSULE | Freq: Every day | ORAL | 0 refills | Status: DC

## 2018-01-16 NOTE — Telephone Encounter (Signed)
Patient called for prescription refill of Adderall.

## 2018-01-16 NOTE — Telephone Encounter (Signed)
Last Visit: 08/20/17 Next Visit: 01/21/18  Okay to refill Adderall?

## 2018-01-21 ENCOUNTER — Encounter: Payer: Self-pay | Admitting: Rheumatology

## 2018-01-21 ENCOUNTER — Ambulatory Visit: Payer: 59 | Admitting: Rheumatology

## 2018-01-21 VITALS — BP 101/65 | HR 68 | Ht 64.0 in | Wt 112.0 lb

## 2018-01-21 DIAGNOSIS — R768 Other specified abnormal immunological findings in serum: Secondary | ICD-10-CM | POA: Diagnosis not present

## 2018-01-21 DIAGNOSIS — M19042 Primary osteoarthritis, left hand: Secondary | ICD-10-CM

## 2018-01-21 DIAGNOSIS — Z86718 Personal history of other venous thrombosis and embolism: Secondary | ICD-10-CM

## 2018-01-21 DIAGNOSIS — D8989 Other specified disorders involving the immune mechanism, not elsewhere classified: Secondary | ICD-10-CM

## 2018-01-21 DIAGNOSIS — Z86711 Personal history of pulmonary embolism: Secondary | ICD-10-CM | POA: Diagnosis not present

## 2018-01-21 DIAGNOSIS — L930 Discoid lupus erythematosus: Secondary | ICD-10-CM | POA: Diagnosis not present

## 2018-01-21 DIAGNOSIS — Z8659 Personal history of other mental and behavioral disorders: Secondary | ICD-10-CM | POA: Diagnosis not present

## 2018-01-21 DIAGNOSIS — Z8719 Personal history of other diseases of the digestive system: Secondary | ICD-10-CM | POA: Diagnosis not present

## 2018-01-21 DIAGNOSIS — M19041 Primary osteoarthritis, right hand: Secondary | ICD-10-CM | POA: Diagnosis not present

## 2018-01-21 DIAGNOSIS — R760 Raised antibody titer: Secondary | ICD-10-CM

## 2018-01-21 DIAGNOSIS — Z79899 Other long term (current) drug therapy: Secondary | ICD-10-CM | POA: Diagnosis not present

## 2018-01-21 DIAGNOSIS — M170 Bilateral primary osteoarthritis of knee: Secondary | ICD-10-CM

## 2018-01-21 DIAGNOSIS — I7300 Raynaud's syndrome without gangrene: Secondary | ICD-10-CM

## 2018-01-21 DIAGNOSIS — F5101 Primary insomnia: Secondary | ICD-10-CM

## 2018-01-21 DIAGNOSIS — M359 Systemic involvement of connective tissue, unspecified: Secondary | ICD-10-CM

## 2018-01-22 DIAGNOSIS — Z124 Encounter for screening for malignant neoplasm of cervix: Secondary | ICD-10-CM | POA: Diagnosis not present

## 2018-01-22 DIAGNOSIS — Z01419 Encounter for gynecological examination (general) (routine) without abnormal findings: Secondary | ICD-10-CM | POA: Diagnosis not present

## 2018-01-22 DIAGNOSIS — N6019 Diffuse cystic mastopathy of unspecified breast: Secondary | ICD-10-CM | POA: Insufficient documentation

## 2018-01-22 DIAGNOSIS — Z1231 Encounter for screening mammogram for malignant neoplasm of breast: Secondary | ICD-10-CM | POA: Diagnosis not present

## 2018-02-03 ENCOUNTER — Other Ambulatory Visit: Payer: Self-pay | Admitting: *Deleted

## 2018-02-03 MED ORDER — HYDROXYCHLOROQUINE SULFATE 200 MG PO TABS: ORAL_TABLET | ORAL | 0 refills | Status: DC

## 2018-02-03 NOTE — Telephone Encounter (Signed)
Last visit: 01/21/18 Next Visit: 07/29/18 Labs: 09/04/17 stable 11/22/2016 Eye exam WNL  Okay to refill per Dr. Estanislado Pandy

## 2018-02-13 ENCOUNTER — Telehealth: Payer: Self-pay | Admitting: Rheumatology

## 2018-02-13 MED ORDER — AMPHETAMINE-DEXTROAMPHET ER 20 MG PO CP24: 20.0000 mg | ORAL_CAPSULE | Freq: Every day | ORAL | 0 refills | Status: DC

## 2018-02-13 NOTE — Telephone Encounter (Signed)
Patient request refill on Adderall. Patient uses [facility] on [address].

## 2018-02-13 NOTE — Telephone Encounter (Signed)
Last visit: 01/21/2018 Next visit: 07/29/2018  Last fill: 01/16/2018  Okay to refill adderall?

## 2018-03-02 ENCOUNTER — Other Ambulatory Visit: Payer: Self-pay | Admitting: Rheumatology

## 2018-03-02 NOTE — Telephone Encounter (Signed)
Last visit: 01/21/18 Next Visit: 07/29/18 Labs: 09/04/17 stable 11/2017 Eye exam WNL  Okay to refill per Dr. Estanislado Pandy

## 2018-03-17 ENCOUNTER — Telehealth: Payer: Self-pay | Admitting: Rheumatology

## 2018-03-17 DIAGNOSIS — L700 Acne vulgaris: Secondary | ICD-10-CM | POA: Diagnosis not present

## 2018-03-17 MED ORDER — AMPHETAMINE-DEXTROAMPHET ER 20 MG PO CP24: 20.0000 mg | ORAL_CAPSULE | Freq: Every day | ORAL | 0 refills | Status: DC

## 2018-03-17 NOTE — Telephone Encounter (Signed)
Last visit: 01/21/18 Next Visit: 07/29/18  Can you please send to the pharmacy, okayed per Dr. Estanislado Pandy

## 2018-03-17 NOTE — Telephone Encounter (Signed)
Ok to refill if due.

## 2018-03-17 NOTE — Telephone Encounter (Signed)
Patient request a [facility] [address]. °

## 2018-04-14 ENCOUNTER — Other Ambulatory Visit: Payer: Self-pay | Admitting: Rheumatology

## 2018-04-14 MED ORDER — AMPHETAMINE-DEXTROAMPHET ER 20 MG PO CP24: 20.0000 mg | ORAL_CAPSULE | Freq: Every day | ORAL | 0 refills | Status: DC

## 2018-04-14 NOTE — Telephone Encounter (Signed)
Last visit: 01/21/18 Next Visit: 07/29/18  Okay to refill Adderall?   

## 2018-04-14 NOTE — Telephone Encounter (Signed)
Patient request [facility] [place]

## 2018-04-14 NOTE — Telephone Encounter (Signed)
This medication would not e-prescribe.

## 2018-04-15 ENCOUNTER — Telehealth: Payer: Self-pay | Admitting: Rheumatology

## 2018-04-15 DIAGNOSIS — Z0000 Encounter for general adult medical examination without abnormal findings: Secondary | ICD-10-CM | POA: Diagnosis not present

## 2018-04-15 NOTE — Telephone Encounter (Signed)
Patient calling regarding her Adderall refill. Patient states pharmacy does not have refill. Please call patient.

## 2018-04-15 NOTE — Telephone Encounter (Signed)
Patient advised prescription is ready for pick up at the office.

## 2018-05-13 ENCOUNTER — Telehealth: Payer: Self-pay | Admitting: Rheumatology

## 2018-05-13 DIAGNOSIS — I8312 Varicose veins of left lower extremity with inflammation: Secondary | ICD-10-CM | POA: Diagnosis not present

## 2018-05-13 DIAGNOSIS — R600 Localized edema: Secondary | ICD-10-CM | POA: Diagnosis not present

## 2018-05-13 DIAGNOSIS — I8311 Varicose veins of right lower extremity with inflammation: Secondary | ICD-10-CM | POA: Diagnosis not present

## 2018-05-13 NOTE — Telephone Encounter (Signed)
Last visit: 01/21/18 Next Visit: 07/29/18  Okay to refill Adderall?

## 2018-05-13 NOTE — Telephone Encounter (Signed)
Patient called requesting prescription refill of [facility] [address]  

## 2018-05-15 MED ORDER — AMPHETAMINE-DEXTROAMPHET ER 20 MG PO CP24: 20.0000 mg | ORAL_CAPSULE | Freq: Every day | ORAL | 0 refills | Status: DC

## 2018-06-05 DIAGNOSIS — R600 Localized edema: Secondary | ICD-10-CM | POA: Diagnosis not present

## 2018-06-05 DIAGNOSIS — I8311 Varicose veins of right lower extremity with inflammation: Secondary | ICD-10-CM | POA: Diagnosis not present

## 2018-06-05 DIAGNOSIS — I8312 Varicose veins of left lower extremity with inflammation: Secondary | ICD-10-CM | POA: Diagnosis not present

## 2018-06-06 ENCOUNTER — Other Ambulatory Visit: Payer: Self-pay | Admitting: Rheumatology

## 2018-06-08 NOTE — Telephone Encounter (Addendum)
Last visit: 01/21/18 Next Visit: 07/29/18 Labs: 09/04/17 stable 11/2017 Eye examWNL  Left message to advise patient she is due for labs.   Okay to refill 30 day supply per Dr. Estanislado Pandy

## 2018-06-11 ENCOUNTER — Telehealth: Payer: Self-pay | Admitting: Rheumatology

## 2018-06-11 DIAGNOSIS — D8989 Other specified disorders involving the immune mechanism, not elsewhere classified: Secondary | ICD-10-CM | POA: Diagnosis not present

## 2018-06-11 DIAGNOSIS — L930 Discoid lupus erythematosus: Secondary | ICD-10-CM | POA: Diagnosis not present

## 2018-06-11 DIAGNOSIS — Z79899 Other long term (current) drug therapy: Secondary | ICD-10-CM | POA: Diagnosis not present

## 2018-06-11 NOTE — Telephone Encounter (Signed)
Patient calling to let you know she just had labs done 06/11/2018 at [facility]. Patient having labs sent here. Please refill Plaquenil.[facility] [address].

## 2018-06-11 NOTE — Telephone Encounter (Signed)
Returned patient's call and advised patient a [facility]. Patient verbalized understanding.

## 2018-06-13 LAB — CBC WITH DIFFERENTIAL/PLATELET
BASOS: 1 %
Basophils Absolute: 0.1 10*3/uL (ref 0.0–0.2)
EOS (ABSOLUTE): 0.1 10*3/uL (ref 0.0–0.4)
Eos: 1 %
HEMATOCRIT: 42.6 % (ref 34.0–46.6)
HEMOGLOBIN: 14.1 g/dL (ref 11.1–15.9)
IMMATURE GRANS (ABS): 0 10*3/uL (ref 0.0–0.1)
IMMATURE GRANULOCYTES: 0 %
Lymphocytes Absolute: 1.9 10*3/uL (ref 0.7–3.1)
Lymphs: 24 %
MCH: 30.7 pg (ref 26.6–33.0)
MCHC: 33.1 g/dL (ref 31.5–35.7)
MCV: 93 fL (ref 79–97)
MONOCYTES: 6 %
Monocytes Absolute: 0.5 10*3/uL (ref 0.1–0.9)
NEUTROS PCT: 68 %
Neutrophils Absolute: 5.4 10*3/uL (ref 1.4–7.0)
Platelets: 221 10*3/uL (ref 150–450)
RBC: 4.6 x10E6/uL (ref 3.77–5.28)
RDW: 12.4 % (ref 12.3–15.4)
WBC: 7.9 10*3/uL (ref 3.4–10.8)

## 2018-06-13 LAB — URINALYSIS, ROUTINE W REFLEX MICROSCOPIC
Bilirubin, UA: NEGATIVE
Glucose, UA: NEGATIVE
Ketones, UA: NEGATIVE
Leukocytes, UA: NEGATIVE
Nitrite, UA: NEGATIVE
PH UA: 6.5 (ref 5.0–7.5)
PROTEIN UA: NEGATIVE
RBC, UA: NEGATIVE
Specific Gravity, UA: 1.009 (ref 1.005–1.030)
UUROB: 0.2 mg/dL (ref 0.2–1.0)

## 2018-06-13 LAB — SEDIMENTATION RATE: SED RATE: 2 mm/h (ref 0–40)

## 2018-06-13 LAB — VITAMIN D 25 HYDROXY (VIT D DEFICIENCY, FRACTURES): Vit D, 25-Hydroxy: 48.8 ng/mL (ref 30.0–100.0)

## 2018-06-13 LAB — C3 AND C4
COMPLEMENT C4, SERUM: 16 mg/dL (ref 14–44)
Complement C3, Serum: 88 mg/dL (ref 82–167)

## 2018-06-13 LAB — ANTI-DNA ANTIBODY, DOUBLE-STRANDED: DSDNA AB: 1 [IU]/mL (ref 0–9)

## 2018-06-15 ENCOUNTER — Telehealth: Payer: Self-pay | Admitting: Rheumatology

## 2018-06-15 MED ORDER — AMPHETAMINE-DEXTROAMPHET ER 20 MG PO CP24: 20.0000 mg | ORAL_CAPSULE | Freq: Every day | ORAL | 0 refills | Status: DC

## 2018-06-15 NOTE — Telephone Encounter (Signed)
Patient called requesting prescription refill of [facility] [address] [facility] [place].

## 2018-06-15 NOTE — Telephone Encounter (Signed)
Last visit: 01/21/2018 Next visit: 07/29/2018  Last fill: 05/15/2018  Okay to refill adderall?

## 2018-06-15 NOTE — Progress Notes (Signed)
WNL

## 2018-06-16 ENCOUNTER — Telehealth: Payer: Self-pay

## 2018-06-16 NOTE — Telephone Encounter (Signed)
Received patient's lab results. They are WNL.   Left message on machine to advise patient of lab results.

## 2018-06-21 ENCOUNTER — Other Ambulatory Visit: Payer: Self-pay | Admitting: Rheumatology

## 2018-06-28 ENCOUNTER — Other Ambulatory Visit: Payer: Self-pay | Admitting: Rheumatology

## 2018-06-29 NOTE — Telephone Encounter (Signed)
Last visit: 01/21/2018 Next visit: 07/29/2018 Labs: 06/11/18 WNL PLQ Eye Exam 12/03/17 WNL   Okay to refill per Dr. Estanislado Pandy

## 2018-07-01 DIAGNOSIS — I8311 Varicose veins of right lower extremity with inflammation: Secondary | ICD-10-CM | POA: Diagnosis not present

## 2018-07-01 DIAGNOSIS — I8312 Varicose veins of left lower extremity with inflammation: Secondary | ICD-10-CM | POA: Diagnosis not present

## 2018-07-01 DIAGNOSIS — R600 Localized edema: Secondary | ICD-10-CM | POA: Diagnosis not present

## 2018-07-15 ENCOUNTER — Telehealth: Payer: Self-pay | Admitting: Rheumatology

## 2018-07-15 MED ORDER — AMPHETAMINE-DEXTROAMPHET ER 20 MG PO CP24: 20.0000 mg | ORAL_CAPSULE | Freq: Every day | ORAL | 0 refills | Status: DC

## 2018-07-15 NOTE — Progress Notes (Signed)
Office Visit Note  Patient: Erica Wood             Date of Birth: 04/06/1966           MRN: [id number]             PCP: Shon Baton, MD Referring: Shon Baton, MD Visit Date: 07/29/2018 Occupation: @GUAROCC @  Subjective:  Pain in both knees   History of Present Illness: Erica Wood is a 52 y.o. female with history of autoimmune disease and osteoarthritis.  She presents today with pain in multiple joints including bilateral knees, bilateral feet, bilateral hands.  She states that she has intermittent joint swelling and stiffness in both hands.  She states she is also been having increased stiffness in her feet.  She says she notices some swelling in her knee joints.  She denies any injuries or overuse activities recently.  She continues to take Plaquenil 2 tablets by mouth twice daily Monday through Friday.  She has not missed any doses recently.  She states she intermittently has sores in her nose but denies any mouth sores.  She denies any sicca symptoms.  She denies any recent rashes but continues to have some hair thinning.  She has chronic fatigue as well as difficulty falling asleep at night.  She does not take a sleep aid.  She states that she has noticed some swollen lymph nodes in her neck.  She continues to have symptoms of Raynaud's but denies any digital ulcerations.   Activities of Daily Living:  Patient reports morning stiffness for 15-30 minutes.   Patient Denies nocturnal pain.  Difficulty dressing/grooming: Denies Difficulty climbing stairs: Denies Difficulty getting out of chair: Denies Difficulty using hands for taps, buttons, cutlery, and/or writing: Reports  Review of Systems  Constitutional: Positive for fatigue.  HENT: Positive for nose dryness. Negative for mouth sores and mouth dryness.   Eyes: Negative for pain, visual disturbance and dryness.  Respiratory: Negative for cough, hemoptysis, shortness of breath and difficulty breathing.   Cardiovascular:  Negative for chest pain, palpitations, hypertension and swelling in legs/feet.  Gastrointestinal: Positive for constipation. Negative for blood in stool and diarrhea.  Endocrine: Negative for increased urination.  Genitourinary: Negative for painful urination.  Musculoskeletal: Positive for arthralgias, joint pain, joint swelling and morning stiffness. Negative for myalgias, muscle weakness, muscle tenderness and myalgias.  Skin: Positive for color change and hair loss. Negative for pallor, rash, nodules/bumps, skin tightness, ulcers and sensitivity to sunlight.  Allergic/Immunologic: Negative for susceptible to infections.  Neurological: Negative for dizziness, numbness, headaches and weakness.  Hematological: Positive for swollen glands.  Psychiatric/Behavioral: Positive for sleep disturbance. Negative for depressed mood. The patient is not nervous/anxious.     PMFS History:  Patient Active Problem List   Diagnosis Date Noted  . Primary osteoarthritis of both knees 05/02/2017  . Raynaud's disease without gangrene 11/26/2016  . 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 Anticardiolipin antibody positive 11/26/2016  . Autoimmune disease ([place]) 11/25/2016  . High risk medication use 11/25/2016  . Primary osteoarthritis of both hands 11/25/2016  . History of DVT (deep vein thrombosis) 11/25/2016  . History of pulmonary embolism 11/25/2016  . Primary insomnia 11/25/2016  . Anxiety 11/25/2016  . Irritable bowel syndrome with constipation 11/25/2016  . Varicose veins of lower extremities with other complications 34/19/3790  . Spider veins of both lower extremities 05/24/2014    Past Medical History:  Diagnosis Date  . Anti-cardiolipin antibody positive   . Anti-cardiolipin antibody syndrome (H[facility])   .  Anxiety   . Arthralgia   . Atypical chest pain   . Autoimmune disease ([place])   . Chronic fatigue   . DVT (deep venous thrombosis) ([place])   . Pulmonary embolus ([place])   . Raynaud's syndrome   . Sicca  ([place])   . Thrombocytopenia ([place])     Family History  Problem Relation Age of Onset  . Rheum arthritis Father   . Cancer Sister        bone    Past Surgical History:  Procedure Laterality Date  . APPENDECTOMY    . COLON SURGERY     Perforated colon repair after colostomy  . GANGLION CYST EXCISION    . HYSTEROSCOPY W/D&C N/A 01/13/2014   Procedure: DILATATION AND CURETTAGE /HYSTEROSCOPY with resection ;  Surgeon: Olga Millers, MD;  Location: [facility];  Service: Gynecology;  Laterality: N/A;  . LAPAROTOMY     Social History   Social History Narrative  . Not on file    Objective: Vital Signs: BP 101/62 (BP Location: Left Arm, Patient Position: Sitting, Cuff Size: Small)   Pulse 75   Resp 12   Ht 5' 4"  (1.626 m)   Wt 111 lb 9.6 oz (50.6 kg)   BMI 19.16 kg/m    Physical Exam  Constitutional: She is oriented to person, place, and time. She appears well-developed and well-nourished.  HENT:  Head: Normocephalic and atraumatic.  No oral or nasal ulcerations.  No parotid swelling.  Eyes: Conjunctivae and EOM are normal.  Neck: Normal range of motion.  Cardiovascular: Normal rate, regular rhythm, normal heart sounds and intact distal pulses.  Pulmonary/Chest: Effort normal and breath sounds normal.  Abdominal: Soft. Bowel sounds are normal.  Lymphadenopathy:    She has no cervical adenopathy.  Neurological: She is alert and oriented to person, place, and time.  Skin: Skin is warm and dry. Capillary refill takes less than 2 seconds.  No malar rash noted.  No discoid lesions noted.  No digital ulcerations or signs of gangrene.   Psychiatric: She has a normal mood and affect. Her behavior is normal.  Nursing note and vitals reviewed.    Musculoskeletal Exam: C-spine, thoracic spine, lumbar spine good range of motion.  No midline spinal tenderness.  No SI joint tenderness.  Shoulder joints, elbow joints, wrist joints, MCPs, PIPs, DIPs good range of motion with no synovitis.  She  has synovial thickening of first, second, and third MCP joints bilaterally.  She has PIP and DIP synovial thickening consistent with osteoarthritis of bilateral hands.  Hip joints good range of motion with no discomfort.  Knee joints good range of motion with bilateral knee crepitus.  No warmth or effusion noted.  Ankle joints, MTPs, PIPs, DIPs good range of motion with no synovitis.  She has osteoarthritic changes in bilateral feet.  No tenderness of trochanteric bursa bilaterally.   CDAI Exam: CDAI Score: Not documented Patient Global Assessment: Not documented; Provider Global Assessment: Not documented Swollen: Not documented; Tender: Not documented Joint Exam   Not documented   There is currently no information documented on the homunculus. Go to the Rheumatology activity and complete the homunculus joint exam.  Investigation: No additional findings.  Imaging: No results found.  Recent Labs: Lab Results  Component Value Date   WBC 7.9 06/11/2018   HGB 14.1 06/11/2018   PLT 221 06/11/2018   NA 140 09/04/2017   K 3.8 09/04/2017   CL 102 09/04/2017   CO2 25 09/04/2017   GLUCOSE  92 09/04/2017   BUN 7 09/04/2017   CREATININE 0.74 09/04/2017   BILITOT 0.3 09/04/2017   ALKPHOS 62 09/04/2017   AST 20 09/04/2017   ALT 13 09/04/2017   PROT 6.3 09/04/2017   ALBUMIN 4.3 09/04/2017   CALCIUM 8.7 09/04/2017   GFRAA 110 09/04/2017    Speciality Comments: PLQ Eye Exam 12/03/17 WNL @ [facility] follow up in 1 year  Procedures:  No procedures performed Allergies: Beef-derived products; Latex; and Sulfa antibiotics   Assessment / Plan:     Visit Diagnoses: Autoimmune disease ([place]) - positive ANA and positive double-stranded DNA: She is clinically been doing well on Plaquenil 200 mg twice daily Monday through Friday.  Sed rate, complements, double-stranded DNA were within normal limits on 06/11/2018.  She continues to have recurrent nasal ulcerations but no oral ulcerations.   She continues have symptoms of Raynaud's but no digital ulcerations were noted on exam today.  She has synovial thickening of bilateral first, second, and third MCP joints but no synovitis was noted.  She has been having increased discomfort in bilateral hands, bilateral feet, bilateral knee joints.  We will be applying for Euflexxa injections for both knee joints.  She declined cortisone injections today in the office.  She has not had any recent rashes or discoid lesions.  No malar rash was noted on exam today.  She will continue on Plaquenil 200 mg twice daily Monday through Friday.  She was advised to notify us if she develops any new or worsening symptoms.  She will follow-up in the office in 5 months.  Discoid lupus: No discoid lesions noted.  She has not had any recent rashes.  She tries to avoid the sunlight.  She denies any photosensitivity at this time.  High risk medication use -  Plaquenil 200 by mouth twice a day Monday through Friday.  PLQ eye exam 12/03/17.  CBC was within normal limits on 06/11/2018.  Future order for CMP will be placed today.  She will have a drawn with her other lab work.  Raynaud's disease without gangrene: She continues to have intermittent symptoms of Raynaud's.  She has no digital ulcerations or signs of gangrene on exam.  She was encouraged to keep her core body temperature warm.  Primary osteoarthritis of both hands: She has PIP and DIP synovial thickening consistent with osteoarthritis of bilateral hands.  She has complete fist formation bilaterally.  Joint protection and muscle strengthening were discussed.  Primary osteoarthritis of both knees: She has been having increased discomfort in bilateral knee joints.  No warmth or effusion was noted.  She has good range of motion with bilateral knee joint crepitus.  She declined cortisone injections today in the office.  We will apply for Euflexxa injections in bilateral knee joints.  Pain in both hands -She has been  having increased discomfort in bilateral hands.  She has synovial thickening of first, second, and third MCP joints bilaterally.  Her father was recently diagnosed with hemochromatosis, and she would like to be screened today.  Iron and ferritin panel was ordered today as well as hemochromatosis DNA PCR.  Plan: Iron, TIBC and Ferritin Panel, Hemochromatosis DNA-PCR(c282y,h63d)  Family history of hemochromatosis -Her father was recently diagnosed with hemochromatosis and she would like to be screened today.  Plan: Iron, TIBC and Ferritin Panel, Hemochromatosis DNA-PCR(c282y,h63d)   Anticardiolipin antibody positive: She is on Xarelto.  History of DVT (deep vein thrombosis): She is on Xarelto.  History of pulmonary embolism: She is  on Xarelto.  Primary insomnia: We discussed the use of melatonin at bedtime.  History of IBS: She continues to have chronic constipation.  History of anxiety    Orders: Orders Placed This Encounter  Procedures  . Hemochromatosis DNA-PCR(c282y,h63d)  . Iron, TIBC and Ferritin Panel  . CMP14+EGFR   Meds ordered this encounter  Medications  . hydroxychloroquine (PLAQUENIL) 200 MG tablet    Sig: TAKE 1 TABLET BY MOUTH TWICE A DAY MONDAY THROUGH FRIDAY    Dispense:  120 tablet    Refill:  0    Face-to-face time spent with patient was 30 minutes. Greater than 50% of time was spent in counseling and coordination of care.  Follow-Up Instructions: Return in about 5 months (around 12/29/2018) for Autoimmune Disease, Osteoarthritis.   Ofilia Neas, PA-C  Note - This record has been created using Dragon software.  Chart creation errors have been sought, but may not always  have been located. Such creation errors do not reflect on  the standard of medical care.

## 2018-07-15 NOTE — Telephone Encounter (Signed)
Patient called requesting prescription [facility]ill of [facility] [address].

## 2018-07-15 NOTE — Telephone Encounter (Signed)
Last visit: 01/21/2018 Next visit: 07/29/2018  Last fill: 06/15/2018  Okay to refill adderall?

## 2018-07-29 ENCOUNTER — Telehealth: Payer: Self-pay

## 2018-07-29 ENCOUNTER — Encounter: Payer: Self-pay | Admitting: Rheumatology

## 2018-07-29 ENCOUNTER — Ambulatory Visit: Payer: 59 | Admitting: Rheumatology

## 2018-07-29 ENCOUNTER — Other Ambulatory Visit: Payer: Self-pay | Admitting: *Deleted

## 2018-07-29 VITALS — BP 101/62 | HR 75 | Resp 12 | Ht 64.0 in | Wt 111.6 lb

## 2018-07-29 DIAGNOSIS — Z8719 Personal history of other diseases of the digestive system: Secondary | ICD-10-CM

## 2018-07-29 DIAGNOSIS — Z86718 Personal history of other venous thrombosis and embolism: Secondary | ICD-10-CM

## 2018-07-29 DIAGNOSIS — Z79899 Other long term (current) drug therapy: Secondary | ICD-10-CM

## 2018-07-29 DIAGNOSIS — M79642 Pain in left hand: Secondary | ICD-10-CM

## 2018-07-29 DIAGNOSIS — M359 Systemic involvement of connective tissue, unspecified: Secondary | ICD-10-CM

## 2018-07-29 DIAGNOSIS — M170 Bilateral primary osteoarthritis of knee: Secondary | ICD-10-CM

## 2018-07-29 DIAGNOSIS — R760 Raised antibody titer: Secondary | ICD-10-CM

## 2018-07-29 DIAGNOSIS — Z86711 Personal history of pulmonary embolism: Secondary | ICD-10-CM

## 2018-07-29 DIAGNOSIS — F5101 Primary insomnia: Secondary | ICD-10-CM

## 2018-07-29 DIAGNOSIS — M19042 Primary osteoarthritis, left hand: Secondary | ICD-10-CM

## 2018-07-29 DIAGNOSIS — D8989 Other specified disorders involving the immune mechanism, not elsewhere classified: Secondary | ICD-10-CM

## 2018-07-29 DIAGNOSIS — I7300 Raynaud's syndrome without gangrene: Secondary | ICD-10-CM

## 2018-07-29 DIAGNOSIS — M19041 Primary osteoarthritis, right hand: Secondary | ICD-10-CM

## 2018-07-29 DIAGNOSIS — Z8659 Personal history of other mental and behavioral disorders: Secondary | ICD-10-CM

## 2018-07-29 DIAGNOSIS — M79641 Pain in right hand: Secondary | ICD-10-CM

## 2018-07-29 DIAGNOSIS — L930 Discoid lupus erythematosus: Secondary | ICD-10-CM

## 2018-07-29 DIAGNOSIS — Z8349 Family history of other endocrine, nutritional and metabolic diseases: Secondary | ICD-10-CM

## 2018-07-29 MED ORDER — HYDROXYCHLOROQUINE SULFATE 200 MG PO TABS: ORAL_TABLET | ORAL | 0 refills | Status: DC

## 2018-07-29 NOTE — Telephone Encounter (Signed)
Please apply for bilateral knee euflexxa injections, per Lovena Le. Thanks!

## 2018-07-29 NOTE — Telephone Encounter (Signed)
Noted  

## 2018-07-31 ENCOUNTER — Telehealth (INDEPENDENT_AMBULATORY_CARE_PROVIDER_SITE_OTHER): Payer: Self-pay

## 2018-07-31 NOTE — Telephone Encounter (Signed)
Submitted VOB for Euflexxa series, bilateral knee. °

## 2018-08-11 ENCOUNTER — Telehealth (INDEPENDENT_AMBULATORY_CARE_PROVIDER_SITE_OTHER): Payer: Self-pay

## 2018-08-11 NOTE — Telephone Encounter (Signed)
Patient is approved for Euflexxa series, bilateral knee. Buy & Bill Covered at 90% of allowable amount, after deductible has been met. Patient will be responsible for 10% OOP. No PA required No Co-pay  Please schedule appointment with Dr. Estanislado Pandy or Lovena Le for gel injection.  Thank You.

## 2018-08-13 ENCOUNTER — Telehealth: Payer: Self-pay | Admitting: Rheumatology

## 2018-08-13 MED ORDER — AMPHETAMINE-DEXTROAMPHET ER 20 MG PO CP24: 20.0000 mg | ORAL_CAPSULE | Freq: Every day | ORAL | 0 refills | Status: DC

## 2018-08-13 NOTE — Telephone Encounter (Signed)
Patient called requesting prescription refill of [facility] [address]. °

## 2018-08-13 NOTE — Telephone Encounter (Signed)
Last Visit: 07/29/18 Next Visit: 12/30/18  Okay to refill Adderall?  

## 2018-08-13 NOTE — Telephone Encounter (Signed)
I spoke with patient who stated she paid a copay the last time she got the injections, but since her insurance changed she is calling them so she can find out "exactly" what her cost will be.  Patient requested and was given the codes and price per code for Euflexxa.

## 2018-08-14 ENCOUNTER — Telehealth (INDEPENDENT_AMBULATORY_CARE_PROVIDER_SITE_OTHER): Payer: Self-pay

## 2018-08-14 NOTE — Telephone Encounter (Signed)
Patient left a voicemail stating she called her insurance company and was told that she does need a prior authorization for Euflexxa because it is not being done at the hospital.  Please advise

## 2018-08-14 NOTE — Telephone Encounter (Signed)
Talked with patient and advised her that I called her insurance and provided them with the Jcode for Euflexxa series, bilateral knee.  Per Levada Dy D.with [employer], no PA is required being that provider is in network.  Tried to explain to patient how much she would be responsible for per Wendy May, but patient stated that she would hold off on gel injection and talk with her insurance again, then give [place] a call back.

## 2018-08-14 NOTE — Telephone Encounter (Signed)
Called and left a VM advising patient that she is correct about not needing a PA for Euflexxa and to CB if she had any questions.

## 2018-08-19 NOTE — Telephone Encounter (Signed)
I spoke with April J who stated she called patient regarding the cost of the Euflexxa injections and patient told her she was not ready to schedule the appointments at this time.

## 2018-09-09 ENCOUNTER — Telehealth (INDEPENDENT_AMBULATORY_CARE_PROVIDER_SITE_OTHER): Payer: Self-pay

## 2018-09-09 NOTE — Telephone Encounter (Signed)
Patient called concerning charges of gel injection for Euflexxa.  Advised patient that she will need to call billing about how she was charged in 2018 and how it will be charged for 2019.

## 2018-09-10 ENCOUNTER — Telehealth (INDEPENDENT_AMBULATORY_CARE_PROVIDER_SITE_OTHER): Payer: Self-pay

## 2018-09-10 NOTE — Telephone Encounter (Signed)
Talked with patient and advised her that I could submit for Euflexxa, bilateral knee under her pharmacy benefits.  Patient agreed with this decision.  Submitted VOB for Euflexxa, bilateral knee under pharmacy.

## 2018-09-14 ENCOUNTER — Other Ambulatory Visit: Payer: Self-pay | Admitting: Rheumatology

## 2018-09-14 MED ORDER — AMPHETAMINE-DEXTROAMPHET ER 20 MG PO CP24: 20.0000 mg | ORAL_CAPSULE | Freq: Every day | ORAL | 0 refills | Status: DC

## 2018-09-14 NOTE — Telephone Encounter (Signed)
Patient called requesting prescription refill of [facility] [address].

## 2018-09-14 NOTE — Telephone Encounter (Signed)
Ok to refill 

## 2018-09-14 NOTE — Telephone Encounter (Signed)
Last Visit: 07/29/18 Next Visit: 12/30/18  Okay to refill Adderall?  

## 2018-09-14 NOTE — Telephone Encounter (Signed)
Left message to advise patient her prescription is ready for pick up.

## 2018-09-15 ENCOUNTER — Telehealth: Payer: Self-pay | Admitting: Rheumatology

## 2018-09-15 MED ORDER — AMPHETAMINE-DEXTROAMPHET ER 20 MG PO CP24: 20.0000 mg | ORAL_CAPSULE | Freq: Every day | ORAL | 0 refills | Status: DC

## 2018-09-15 NOTE — Telephone Encounter (Signed)
Will you please resend prescription for patient. Thanks!

## 2018-09-15 NOTE — Telephone Encounter (Signed)
Patient left a voicemail stating she is unable to stop by the office to pick up her prescription.  Patient states she needs to have it sent to the pharmacy so she can pick it up after work tonight.  Patient requested a return call ASAP.

## 2018-09-18 ENCOUNTER — Telehealth (INDEPENDENT_AMBULATORY_CARE_PROVIDER_SITE_OTHER): Payer: Self-pay

## 2018-09-18 NOTE — Telephone Encounter (Signed)
Received a fax from [facility] stating that a Rx was needed for Euflexxa, bilateral knee. Faxed Rx for Euflexxa, bilateral knee to [facility] at [phone] and [phone].

## 2018-09-18 NOTE — Telephone Encounter (Signed)
PA required for Euflexxa, bilateral knee. Talked with Den T.at [facility] and initiated PA.  Advised Den that I preferred to do PA through [employer].    Submitted PA for Euflexxa, bilateral knee through [employer].

## 2018-09-22 ENCOUNTER — Encounter (INDEPENDENT_AMBULATORY_CARE_PROVIDER_SITE_OTHER): Payer: Self-pay

## 2018-09-22 ENCOUNTER — Other Ambulatory Visit (INDEPENDENT_AMBULATORY_CARE_PROVIDER_SITE_OTHER): Payer: Self-pay

## 2018-09-22 ENCOUNTER — Telehealth (INDEPENDENT_AMBULATORY_CARE_PROVIDER_SITE_OTHER): Payer: Self-pay

## 2018-09-22 NOTE — Telephone Encounter (Signed)
Re-faxed Office visit note 01/21/2018 to [facility] at [phone].

## 2018-09-23 ENCOUNTER — Telehealth (INDEPENDENT_AMBULATORY_CARE_PROVIDER_SITE_OTHER): Payer: Self-pay

## 2018-09-23 NOTE — Telephone Encounter (Signed)
Okay to send an appeal letter.

## 2018-09-23 NOTE — Telephone Encounter (Signed)
I received a denial letter from [facility] for Euflexxa series, bilateral knee for patient.  Patient has had Euflexxa in the past.  I faxed all office notes to [facility].   When I first submitted for Euflexxa, patient's VOB stated that we could buy & bill, but patient was not very happy with the outcome of the price, so I submitted for VOB through the specialty pharmacy and now this is the outcome.  Please advise if you would like to appeal this denial, if so I would need a statement from Dr. Estanislado Pandy.  Thank You.

## 2018-10-06 ENCOUNTER — Telehealth (INDEPENDENT_AMBULATORY_CARE_PROVIDER_SITE_OTHER): Payer: Self-pay

## 2018-10-06 ENCOUNTER — Encounter: Payer: Self-pay | Admitting: *Deleted

## 2018-10-06 NOTE — Telephone Encounter (Signed)
Received a fax from [facility] stating that they have tried to contact patient for her consent.   Talked with Kayla at [facility] and was advised that patient's consent and a $50.00 co-pay are needed before injection can be delivered to our office.   Called and left a VM for patient to call the office back or to call [facility] at [phone] and give her consent and co-pay.

## 2018-10-06 NOTE — Telephone Encounter (Signed)
FYI- Patient has been approved for Euflexxa injection.  Currently waiting for patient to give her consent to have injection delivered to our office.  Thanks.

## 2018-10-06 NOTE — Telephone Encounter (Signed)
Letter written and faxed.

## 2018-10-07 ENCOUNTER — Telehealth (INDEPENDENT_AMBULATORY_CARE_PROVIDER_SITE_OTHER): Payer: Self-pay | Admitting: Rheumatology

## 2018-10-07 ENCOUNTER — Telehealth (INDEPENDENT_AMBULATORY_CARE_PROVIDER_SITE_OTHER): Payer: Self-pay

## 2018-10-07 NOTE — Telephone Encounter (Signed)
Talked with Exie Parody T.at [facility] to schedule delivery for Euflexxa injection.  Per Exie Parody T. At [place] with be delivered on Tuesday, 10/13/2018. Advised patient of message above.  Patient understands.

## 2018-10-07 NOTE — Telephone Encounter (Signed)
Patient returned my call concerning euflexxa injection. Patient wanted to know how much she would be charged for the administration of gel injection.  Advised patient that I would speak to my supervisor, Roby Lofts. Talked with Morrison Old advised her of message above. Per Abigail Butts M.patient would be responsible for $565.83, which is an estimate.  Talked with patient and advised her of possible charge, but patient was not understanding.  Transferred call to Abigail Butts M.to explain charge in detail.

## 2018-10-07 NOTE — Telephone Encounter (Signed)
Patient called to speak to you.  She stated that she spoke with [facility] and they will not send the medication without speaking to someone about scheduling a delivery date.  [phone]

## 2018-10-07 NOTE — Telephone Encounter (Signed)
Talked with patient and advised her that I will give [facility] a call to schedule delivery.  Patient understands.

## 2018-10-13 ENCOUNTER — Telehealth: Payer: Self-pay | Admitting: Rheumatology

## 2018-10-13 MED ORDER — AMPHETAMINE-DEXTROAMPHET ER 20 MG PO CP24: 20.0000 mg | ORAL_CAPSULE | Freq: Every day | ORAL | 0 refills | Status: DC

## 2018-10-13 NOTE — Telephone Encounter (Signed)
Patient called requesting prescription [facility] [address]

## 2018-10-13 NOTE — Telephone Encounter (Signed)
Last Visit: 07/29/18 Next Visit: 12/30/18  Okay to refill Adderall?

## 2018-10-14 ENCOUNTER — Telehealth: Payer: Self-pay | Admitting: Rheumatology

## 2018-10-14 NOTE — Telephone Encounter (Signed)
Patient left a voicemail stating she was returning your call and checking if her Euflexxa injections were delivered  yesterday 10/13/18.

## 2018-10-15 NOTE — Telephone Encounter (Signed)
Patient injections were received on Tuesday, 10/13/2018.  Patient has appointments scheduled for gel injections.

## 2018-10-19 ENCOUNTER — Ambulatory Visit: Payer: 59 | Admitting: Physician Assistant

## 2018-10-19 DIAGNOSIS — M170 Bilateral primary osteoarthritis of knee: Secondary | ICD-10-CM

## 2018-10-19 MED ORDER — LIDOCAINE HCL 1 % IJ SOLN
1.5000 mL | INTRAMUSCULAR | Status: AC | PRN
  Administered 2018-10-19: 1.5 mL

## 2018-10-19 MED ORDER — SODIUM HYALURONATE (VISCOSUP) 20 MG/2ML IX SOSY
20.0000 mg | PREFILLED_SYRINGE | INTRA_ARTICULAR | Status: AC | PRN
  Administered 2018-10-19: 20 mg via INTRA_ARTICULAR

## 2018-10-19 NOTE — Progress Notes (Signed)
   Procedure Note  Patient: Erica Wood             Date of Birth: [birth year]           MRN: [id number]             Visit Date: 10/19/2018  Procedures: Visit Diagnoses: Primary osteoarthritis of both knees euflexxa #1 bilateral B/B Large Joint Inj: bilateral knee on 10/19/2018 10:11 AM Indications: pain Details: 27 G 1.5 in needle, medial approach  Arthrogram: No  Medications (Right): 20 mg Sodium Hyaluronate 20 MG/2ML; 1.5 mL lidocaine 1 % Aspirate (Right): 0 mL Medications (Left): 20 mg Sodium Hyaluronate 20 MG/2ML; 1.5 mL lidocaine 1 % Aspirate (Left): 0 mL Outcome: tolerated well, no immediate complications Procedure, treatment alternatives, risks and benefits explained, specific risks discussed. Consent was given by the patient. Immediately prior to procedure a time out was called to verify the correct patient, procedure, equipment, support staff and site/side marked as required. Patient was prepped and draped in the usual sterile fashion.     Patient tolerated the procedure well.  Hazel Sams, PA-C

## 2018-10-26 ENCOUNTER — Ambulatory Visit: Payer: 59 | Admitting: Physician Assistant

## 2018-10-26 DIAGNOSIS — M170 Bilateral primary osteoarthritis of knee: Secondary | ICD-10-CM

## 2018-10-26 MED ORDER — LIDOCAINE HCL 1 % IJ SOLN
1.5000 mL | INTRAMUSCULAR | Status: AC | PRN
  Administered 2018-10-26: 1.5 mL

## 2018-10-26 MED ORDER — SODIUM HYALURONATE (VISCOSUP) 20 MG/2ML IX SOSY
20.0000 mg | PREFILLED_SYRINGE | INTRA_ARTICULAR | Status: AC | PRN
  Administered 2018-10-26: 20 mg via INTRA_ARTICULAR

## 2018-10-26 NOTE — Progress Notes (Signed)
   Procedure Note  Patient: Erica Wood             Date of Birth: January 13, 1966           MRN: [id number]             Visit Date: 10/26/2018  Procedures: Visit Diagnoses: Primary osteoarthritis of both knees - Plan: Large Joint Inj: bilateral knee Euflexxa #2 bilateral B/B Large Joint Inj: bilateral knee on 10/26/2018 9:31 AM Indications: pain Details: 27 G 1.5 in needle, medial approach  Arthrogram: No  Medications (Right): 20 mg Sodium Hyaluronate 20 MG/2ML; 1.5 mL lidocaine 1 % Aspirate (Right): 0 mL Medications (Left): 20 mg Sodium Hyaluronate 20 MG/2ML; 1.5 mL lidocaine 1 % Aspirate (Left): 0 mL Outcome: tolerated well, no immediate complications Procedure, treatment alternatives, risks and benefits explained, specific risks discussed. Consent was given by the patient. Immediately prior to procedure a time out was called to verify the correct patient, procedure, equipment, support staff and site/side marked as required. Patient was prepped and draped in the usual sterile fashion.     Patient tolerated the procedure well.  Hazel Sams, PA-C

## 2018-10-29 ENCOUNTER — Other Ambulatory Visit: Payer: Self-pay | Admitting: Physician Assistant

## 2018-10-29 DIAGNOSIS — Z79899 Other long term (current) drug therapy: Secondary | ICD-10-CM | POA: Diagnosis not present

## 2018-10-29 DIAGNOSIS — Z8349 Family history of other endocrine, nutritional and metabolic diseases: Secondary | ICD-10-CM | POA: Diagnosis not present

## 2018-10-29 DIAGNOSIS — M170 Bilateral primary osteoarthritis of knee: Secondary | ICD-10-CM | POA: Diagnosis not present

## 2018-10-30 ENCOUNTER — Telehealth: Payer: Self-pay | Admitting: Rheumatology

## 2018-10-30 LAB — CMP14+EGFR
A/G RATIO: 2.2 (ref 1.2–2.2)
ALT: 11 IU/L (ref 0–32)
AST: 15 IU/L (ref 0–40)
Albumin: 4.4 g/dL (ref 3.5–5.5)
Alkaline Phosphatase: 66 IU/L (ref 39–117)
BILIRUBIN TOTAL: 0.3 mg/dL (ref 0.0–1.2)
BUN/Creatinine Ratio: 14 (ref 9–23)
BUN: 12 mg/dL (ref 6–24)
CO2: 24 mmol/L (ref 20–29)
Calcium: 8.9 mg/dL (ref 8.7–10.2)
Chloride: 101 mmol/L (ref 96–106)
Creatinine, Ser: 0.83 mg/dL (ref 0.57–1.00)
GFR calc Af Amer: 95 mL/min/{1.73_m2} (ref >59)
GFR calc non Af Amer: 82 mL/min/{1.73_m2} (ref >59)
Globulin, Total: 2 g/dL (ref 1.5–4.5)
Glucose: 67 mg/dL (ref 65–99)
Potassium: 4.7 mmol/L (ref 3.5–5.2)
Sodium: 138 mmol/L (ref 134–144)
Total Protein: 6.4 g/dL (ref 6.0–8.5)

## 2018-10-30 LAB — IRON,TIBC AND FERRITIN PANEL
Ferritin: 43 ng/mL (ref 15–150)
Iron Saturation: 37 % (ref 15–55)
Iron: 88 ug/dL (ref 27–159)
Total Iron Binding Capacity: 241 ug/dL — ABNORMAL LOW (ref 250–450)
UIBC: 153 ug/dL (ref 131–425)

## 2018-10-30 LAB — HEMOCHROMATOSIS DNA-PCR(C282Y,H63D)

## 2018-10-30 NOTE — Progress Notes (Signed)
CMP WNL. Patient requested iron panel to be drawn. Please forward results to PCP.

## 2018-10-30 NOTE — Telephone Encounter (Signed)
Patient called stating she was returning  Andrea's call regarding her labwork results. 

## 2018-10-30 NOTE — Telephone Encounter (Signed)
Advised patient of lab results, patient verbalized understanding. Labs have been forwarded to PCP.

## 2018-11-02 ENCOUNTER — Ambulatory Visit: Payer: 59 | Admitting: Physician Assistant

## 2018-11-02 DIAGNOSIS — M170 Bilateral primary osteoarthritis of knee: Secondary | ICD-10-CM

## 2018-11-02 MED ORDER — LIDOCAINE HCL 1 % IJ SOLN
1.5000 mL | INTRAMUSCULAR | Status: AC | PRN
  Administered 2018-11-02: 1.5 mL

## 2018-11-02 MED ORDER — SODIUM HYALURONATE (VISCOSUP) 20 MG/2ML IX SOSY
20.0000 mg | PREFILLED_SYRINGE | INTRA_ARTICULAR | Status: AC | PRN
  Administered 2018-11-02: 20 mg via INTRA_ARTICULAR

## 2018-11-02 NOTE — Progress Notes (Signed)
   Procedure Note  Patient: Erica Wood             Date of Birth: Apr 06, 1966           MRN: [id number]             Visit Date: 11/02/2018  Procedures: Visit Diagnoses: Primary osteoarthritis of both knees - Plan: Large Joint Inj: bilateral knee  Euflexxa #3 bilateral knee joint injections B/B  Large Joint Inj: bilateral knee on 11/02/2018 11:32 AM Indications: pain Details: 27 G 1.5 in needle, medial approach  Arthrogram: No  Medications (Right): 20 mg Sodium Hyaluronate 20 MG/2ML; 1.5 mL lidocaine 1 % Aspirate (Right): 0 mL Medications (Left): 20 mg Sodium Hyaluronate 20 MG/2ML; 1.5 mL lidocaine 1 % Aspirate (Left): 0 mL Outcome: tolerated well, no immediate complications Procedure, treatment alternatives, risks and benefits explained, specific risks discussed. Consent was given by the patient. Immediately prior to procedure a time out was called to verify the correct patient, procedure, equipment, support staff and site/side marked as required. Patient was prepped and draped in the usual sterile fashion.     Patient tolerated the procedure well.  Hazel Sams, PA-C

## 2018-11-04 LAB — CMP14+EGFR
A/G RATIO: 2.2 (ref 1.2–2.2)
ALT: 11 IU/L (ref 0–32)
AST: 15 IU/L (ref 0–40)
Albumin: 4.4 g/dL (ref 3.5–5.5)
Alkaline Phosphatase: 66 IU/L (ref 39–117)
BUN/Creatinine Ratio: 14 (ref 9–23)
BUN: 12 mg/dL (ref 6–24)
Bilirubin Total: 0.3 mg/dL (ref 0.0–1.2)
CALCIUM: 8.9 mg/dL (ref 8.7–10.2)
CO2: 24 mmol/L (ref 20–29)
Chloride: 101 mmol/L (ref 96–106)
Creatinine, Ser: 0.83 mg/dL (ref 0.57–1.00)
GFR calc Af Amer: 95 mL/min/{1.73_m2} (ref >59)
GFR calc non Af Amer: 82 mL/min/{1.73_m2} (ref >59)
Globulin, Total: 2 g/dL (ref 1.5–4.5)
Glucose: 67 mg/dL (ref 65–99)
POTASSIUM: 4.7 mmol/L (ref 3.5–5.2)
SODIUM: 138 mmol/L (ref 134–144)
Total Protein: 6.4 g/dL (ref 6.0–8.5)

## 2018-11-04 LAB — IRON,TIBC AND FERRITIN PANEL
Ferritin: 43 ng/mL (ref 15–150)
Iron Saturation: 37 % (ref 15–55)
Iron: 88 ug/dL (ref 27–159)
Total Iron Binding Capacity: 241 ug/dL — ABNORMAL LOW (ref 250–450)
UIBC: 153 ug/dL (ref 131–425)

## 2018-11-04 LAB — HEMOCHROMATOSIS DNA-PCR(C282Y,H63D)

## 2018-11-05 ENCOUNTER — Telehealth: Payer: Self-pay | Admitting: Rheumatology

## 2018-11-05 NOTE — Telephone Encounter (Signed)
Attempted to contact the patient and left message for patient to call the office.  

## 2018-11-05 NOTE — Progress Notes (Signed)
I left a voicemail for Erica Wood to return my call at the office to discuss recent lab results.

## 2018-11-05 NOTE — Telephone Encounter (Signed)
Patient left a voicemail stating she is returning a call to the office regarding her lab results.

## 2018-11-09 NOTE — Progress Notes (Signed)
Please call patient to notify her that the hemochromatosis DNA PCR revealed that she is a carrier.  The comment states she is most likely an unaffected carrier.  Please offer a patient a referral to hematology if she would like a more extensive evaluation due to her family history.

## 2018-11-10 ENCOUNTER — Telehealth: Payer: Self-pay | Admitting: Rheumatology

## 2018-11-10 MED ORDER — AMPHETAMINE-DEXTROAMPHET ER 20 MG PO CP24: 20.0000 mg | ORAL_CAPSULE | Freq: Every day | ORAL | 0 refills | Status: DC

## 2018-11-10 NOTE — Telephone Encounter (Signed)
Patient called [facility] [address]

## 2018-11-10 NOTE — Telephone Encounter (Signed)
Last Visit: 07/29/18 Next Visit: 12/30/18  Okay to refill Adderall?

## 2018-11-16 ENCOUNTER — Telehealth (INDEPENDENT_AMBULATORY_CARE_PROVIDER_SITE_OTHER): Payer: Self-pay | Admitting: *Deleted

## 2018-11-16 NOTE — Telephone Encounter (Signed)
Just an FYI, patient has 2 charts, [facility] is putting a ticket in to merge charts. Patient's Euflexxa in December was patient purchased instead of buy and bill. Appointment note has been corrected. Sorry for the confusion. Thank you.

## 2018-11-18 DIAGNOSIS — I8311 Varicose veins of right lower extremity with inflammation: Secondary | ICD-10-CM | POA: Diagnosis not present

## 2018-11-18 DIAGNOSIS — R600 Localized edema: Secondary | ICD-10-CM | POA: Diagnosis not present

## 2018-11-18 DIAGNOSIS — I8312 Varicose veins of left lower extremity with inflammation: Secondary | ICD-10-CM | POA: Diagnosis not present

## 2018-11-20 DIAGNOSIS — I8311 Varicose veins of right lower extremity with inflammation: Secondary | ICD-10-CM | POA: Diagnosis not present

## 2018-11-20 DIAGNOSIS — I8312 Varicose veins of left lower extremity with inflammation: Secondary | ICD-10-CM | POA: Diagnosis not present

## 2018-12-12 HISTORY — PX: ABLATION: SHX5711

## 2018-12-15 ENCOUNTER — Telehealth: Payer: Self-pay | Admitting: Rheumatology

## 2018-12-15 MED ORDER — AMPHETAMINE-DEXTROAMPHET ER 20 MG PO CP24: 20.0000 mg | ORAL_CAPSULE | Freq: Every day | ORAL | 0 refills | Status: DC

## 2018-12-15 NOTE — Telephone Encounter (Signed)
Last Visit: 07/29/18 Next Visit: 12/30/18  Okay to refill Adderall?

## 2018-12-15 NOTE — Telephone Encounter (Signed)
Patient called requesting prescription refill of [facility] [address]  

## 2018-12-16 NOTE — Progress Notes (Deleted)
Office Visit Note  Patient: Erica Wood             Date of Birth: 04-Jan-1966           MRN: [id number]             PCP: Shon Baton, MD Referring: Shon Baton, MD Visit Date: 12/30/2018 Occupation: @GUAROCC @  Subjective:  No chief complaint on file.   History of Present Illness: Erica Wood is a 53 y.o. female ***   Activities of Daily Living:  Patient reports morning stiffness for *** {minute/hour:19697}.   Patient {ACTIONS;DENIES/REPORTS:21021675::"Denies"} nocturnal pain.  Difficulty dressing/grooming: {ACTIONS;DENIES/REPORTS:21021675::"Denies"} Difficulty climbing stairs: {ACTIONS;DENIES/REPORTS:21021675::"Denies"} Difficulty getting out of chair: {ACTIONS;DENIES/REPORTS:21021675::"Denies"} Difficulty using hands for taps, buttons, cutlery, and/or writing: {ACTIONS;DENIES/REPORTS:21021675::"Denies"}  No Rheumatology ROS completed.   PMFS History:  Patient Active Problem List   Diagnosis Date Noted  . Primary osteoarthritis of both knees 05/02/2017  . Raynaud's disease without gangrene 11/26/2016  . 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 Anticardiolipin antibody positive 11/26/2016  . Autoimmune disease ([place]) 11/25/2016  . High risk medication use 11/25/2016  . Primary osteoarthritis of both hands 11/25/2016  . History of DVT (deep vein thrombosis) 11/25/2016  . History of pulmonary embolism 11/25/2016  . Primary insomnia 11/25/2016  . Anxiety 11/25/2016  . Irritable bowel syndrome with constipation 11/25/2016  . Varicose veins of lower extremities with other complications 62/95/2841  . Spider veins of both lower extremities 05/24/2014    Past Medical History:  Diagnosis Date  . Anti-cardiolipin antibody positive   . Anti-cardiolipin antibody syndrome (HCC)   . Anxiety   . Arthralgia   . Atypical chest pain   . Autoimmune disease ([place])   . Chronic fatigue   . DVT (deep venous thrombosis) ([place])   . Pulmonary embolus ([place])   . Raynaud's syndrome   . Sicca ([place])    . Thrombocytopenia ([place])     Family History  Problem Relation Age of Onset  . Rheum arthritis Father   . Cancer Sister        bone    Past Surgical History:  Procedure Laterality Date  . APPENDECTOMY    . COLON SURGERY     Perforated colon repair after colostomy  . GANGLION CYST EXCISION    . HYSTEROSCOPY W/D&C N/A 01/13/2014   Procedure: DILATATION AND CURETTAGE /HYSTEROSCOPY with resection ;  Surgeon: Olga Millers, MD;  Location: [facility];  Service: Gynecology;  Laterality: N/A;  . LAPAROTOMY     Social History   Social History Narrative  . Not on file    There is no immunization history on file for this patient.   Objective: Vital Signs: There were no vitals taken for this visit.   Physical Exam   Musculoskeletal Exam: ***  CDAI Exam: CDAI Score: Not documented Patient Global Assessment: Not documented; Provider Global Assessment: Not documented Swollen: Not documented; Tender: Not documented Joint Exam   Not documented   There is currently no information documented on the homunculus. Go to the Rheumatology activity and complete the homunculus joint exam.  Investigation: No additional findings.  Imaging: No results found.  Recent Labs: Lab Results  Component Value Date   WBC 7.9 06/11/2018   HGB 14.1 06/11/2018   PLT 221 06/11/2018   NA 138 10/29/2018   NA 138 10/29/2018   K 4.7 10/29/2018   K 4.7 10/29/2018   CL 101 10/29/2018   CL 101 10/29/2018   CO2 24 10/29/2018   CO2 24 10/29/2018  GLUCOSE 67 10/29/2018   GLUCOSE 67 10/29/2018   BUN 12 10/29/2018   BUN 12 10/29/2018   CREATININE 0.83 10/29/2018   CREATININE 0.83 10/29/2018   BILITOT 0.3 10/29/2018   BILITOT 0.3 10/29/2018   ALKPHOS 66 10/29/2018   ALKPHOS 66 10/29/2018   AST 15 10/29/2018   AST 15 10/29/2018   ALT 11 10/29/2018   ALT 11 10/29/2018   PROT 6.4 10/29/2018   PROT 6.4 10/29/2018   ALBUMIN 4.4 10/29/2018   ALBUMIN 4.4 10/29/2018   CALCIUM 8.9 10/29/2018   CALCIUM  8.9 10/29/2018   GFRAA 95 10/29/2018   GFRAA 95 10/29/2018    Speciality Comments: PLQ Eye Exam 12/03/17 WNL @ [facility] follow up in 1 year  Procedures:  No procedures performed Allergies: Beef-derived products; Latex; and Sulfa antibiotics   Assessment / Plan:     Visit Diagnoses: No diagnosis found.   Orders: No orders of the defined types were placed in this encounter.  No orders of the defined types were placed in this encounter.   Face-to-face time spent with patient was *** minutes. Greater than 50% of time was spent in counseling and coordination of care.  Follow-Up Instructions: No follow-ups on file.   Earnestine Mealing, CMA  Note - This record has been created using Editor, commissioning.  Chart creation errors have been sought, but may not always  have been located. Such creation errors do not reflect on  the standard of medical care.

## 2018-12-30 ENCOUNTER — Ambulatory Visit: Payer: 59 | Admitting: Rheumatology

## 2018-12-30 DIAGNOSIS — I8312 Varicose veins of left lower extremity with inflammation: Secondary | ICD-10-CM | POA: Diagnosis not present

## 2019-01-01 DIAGNOSIS — I8312 Varicose veins of left lower extremity with inflammation: Secondary | ICD-10-CM | POA: Diagnosis not present

## 2019-01-05 NOTE — Progress Notes (Signed)
Office Visit Note  Patient: Erica Wood             Date of Birth: [birth year]           MRN: [id number]             PCP: Shon Baton, MD Referring: Shon Baton, MD Visit Date: 01/19/2019 Occupation: @GUAROCC @  Subjective:  Pain in both hands    History of Present Illness: Erica Wood is a 53 y.o. female with history of autoimmune disease, discoid lupus, osteoarthritis, and Raynaud's. She is taking PLQ 200 mg BID three times per week. She has been having increased pain in both hands, both elbows, and both knee joints.  She denies any rashes.  She denies any discoid lesions. She reports she is having worsening Raynaud's but denies any ulcerations. She denies any sicca symptoms.  She reports she has intermittent nasal ulcerations but denies oral ulcerations.  Activities of Daily Living:  Patient reports morning stiffness for 30 minutes.   Patient Reports nocturnal pain.  Difficulty dressing/grooming: Denies Difficulty climbing stairs: Denies Difficulty getting out of chair: Denies Difficulty using hands for taps, buttons, cutlery, and/or writing: Reports  Review of Systems  Constitutional: Positive for fatigue.  HENT: Positive for nose dryness (Nasal ulcerations ). Negative for mouth sores and mouth dryness.   Eyes: Negative for pain, visual disturbance and dryness.  Respiratory: Negative for cough, hemoptysis, shortness of breath and difficulty breathing.   Cardiovascular: Negative for chest pain, palpitations, hypertension and swelling in legs/feet.  Gastrointestinal: Positive for constipation. Negative for blood in stool and diarrhea.  Endocrine: Negative for increased urination.  Genitourinary: Negative for painful urination.  Musculoskeletal: Positive for arthralgias, joint pain and joint swelling. Negative for myalgias, muscle weakness, morning stiffness, muscle tenderness and myalgias.  Skin: Positive for color change (Raynaud's). Negative for pallor, rash, hair loss,  nodules/bumps, skin tightness, ulcers and sensitivity to sunlight.  Allergic/Immunologic: Negative for susceptible to infections.  Neurological: Negative for dizziness, numbness, headaches and weakness.  Hematological: Negative for swollen glands.  Psychiatric/Behavioral: Positive for sleep disturbance. Negative for depressed mood. The patient is not nervous/anxious.     PMFS History:  Patient Active Problem List   Diagnosis Date Noted  . Primary osteoarthritis of both knees 05/02/2017  . Raynaud's disease without gangrene 11/26/2016  . 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 Anticardiolipin antibody positive 11/26/2016  . Autoimmune disease ([place]) 11/25/2016  . High risk medication use 11/25/2016  . Primary osteoarthritis of both hands 11/25/2016  . History of DVT (deep vein thrombosis) 11/25/2016  . History of pulmonary embolism 11/25/2016  . Primary insomnia 11/25/2016  . Anxiety 11/25/2016  . Irritable bowel syndrome with constipation 11/25/2016  . Varicose veins of lower extremities with other complications 34/19/3790  . Spider veins of both lower extremities 05/24/2014    Past Medical History:  Diagnosis Date  . Anti-cardiolipin antibody positive   . Anti-cardiolipin antibody syndrome (HCC)   . Anxiety   . Arthralgia   . Atypical chest pain   . Autoimmune disease ([place])   . Chronic fatigue   . DVT (deep venous thrombosis) ([place])   . Pulmonary embolus ([place])   . Raynaud's syndrome   . Sicca ([place])   . Thrombocytopenia ([place])     Family History  Problem Relation Age of Onset  . Rheum arthritis Father   . Hemachromatosis Father   . Cancer Sister        bone    Past Surgical History:  Procedure  Laterality Date  . ABLATION  12/2018  . APPENDECTOMY    . COLON SURGERY     Perforated colon repair after colostomy  . GANGLION CYST EXCISION    . HYSTEROSCOPY W/D&C N/A 01/13/2014   Procedure: DILATATION AND CURETTAGE /HYSTEROSCOPY with resection ;  Surgeon: Olga Millers, MD;   Location: [facility];  Service: Gynecology;  Laterality: N/A;  . LAPAROTOMY     Social History   Social History Narrative  . Not on file    There is no immunization history on file for this patient.   Objective: Vital Signs: BP 108/64 (BP Location: Left Arm, Patient Position: Sitting, Cuff Size: Normal)   Pulse 69   Resp 12   Ht 5' 4"  (1.626 m)   Wt 112 lb 12.8 oz (51.2 kg)   BMI 19.36 kg/m    Physical Exam Vitals signs and nursing note reviewed.  Constitutional:      Appearance: She is well-developed.  HENT:     Head: Normocephalic and atraumatic.     Comments: No parotid swelling or tenderness.     Nose:     Comments: No nasal ulcerations    Mouth/Throat:     Comments: No oral ulcerations Eyes:     Conjunctiva/sclera: Conjunctivae normal.  Neck:     Musculoskeletal: Normal range of motion.  Cardiovascular:     Rate and Rhythm: Normal rate and regular rhythm.     Heart sounds: Normal heart sounds.  Pulmonary:     Effort: Pulmonary effort is normal.     Breath sounds: Normal breath sounds.  Abdominal:     General: Bowel sounds are normal.     Palpations: Abdomen is soft.  Lymphadenopathy:     Cervical: No cervical adenopathy.  Skin:    General: Skin is warm and dry.     Capillary Refill: Capillary refill takes 2 to 3 seconds.  Neurological:     Mental Status: She is alert and oriented to person, place, and time.  Psychiatric:        Behavior: Behavior normal.      Musculoskeletal Exam: C-spine, thoracic spine, and lumbar spine good ROM.  No midline spinal tenderness.  No SI joint tenderness.  Shoulder joints, elbow joints, wrist joints good ROM with no synovitis.  Right 2nd and 3rd MCP synovitis. Left 3rd MCP synovitis.   CDAI Exam: CDAI Score: Not documented Patient Global Assessment: Not documented; Provider Global Assessment: Not documented Swollen: 3 ; Tender: 3  Joint Exam      Right  Left  MCP 2  Swollen Tender  Swollen Tender  MCP 3  Swollen  Tender        Investigation: No additional findings.  Imaging: No results found.  Recent Labs: Lab Results  Component Value Date   WBC 7.9 06/11/2018   HGB 14.1 06/11/2018   PLT 221 06/11/2018   NA 138 10/29/2018   NA 138 10/29/2018   K 4.7 10/29/2018   K 4.7 10/29/2018   CL 101 10/29/2018   CL 101 10/29/2018   CO2 24 10/29/2018   CO2 24 10/29/2018   GLUCOSE 67 10/29/2018   GLUCOSE 67 10/29/2018   BUN 12 10/29/2018   BUN 12 10/29/2018   CREATININE 0.83 10/29/2018   CREATININE 0.83 10/29/2018   BILITOT 0.3 10/29/2018   BILITOT 0.3 10/29/2018   ALKPHOS 66 10/29/2018   ALKPHOS 66 10/29/2018   AST 15 10/29/2018   AST 15 10/29/2018   ALT 11 10/29/2018  ALT 11 10/29/2018   PROT 6.4 10/29/2018   PROT 6.4 10/29/2018   ALBUMIN 4.4 10/29/2018   ALBUMIN 4.4 10/29/2018   CALCIUM 8.9 10/29/2018   CALCIUM 8.9 10/29/2018   GFRAA 95 10/29/2018   GFRAA 95 10/29/2018    Speciality Comments: PLQ Eye Exam 12/03/17 WNL @ [facility] follow up in 1 year  Procedures:  No procedures performed Allergies: Beef-derived products; Latex; and Sulfa antibiotics   Assessment / Plan:     Visit Diagnoses: Autoimmune disease ([place]) - positive ANA and positive double-stranded DNA: She has synovitis and tenderness of the right 2nd and 3rd MCPs and left 3rd PIP joints.  She has been having increased pain and intermittent joint swelling in both hands and both elbow joints since reducing the dose of PLQ.  She has been taking PLQ 200 mg BID 3 days per week.  She has also noticed worsening symptoms of Raynaud's but no digital ulcerations were noted.  Capillary refill 2-3 seconds.  No malar rash or discoid lesions noted.  No hair loss. She has recurrent nasal ulcerations but no oral ulcerations.  She has no sicca symptoms and no parotid swelling noted.  She has chronic fatigue but no cervical lymphadenopathy or fevers.  Orders for autoimmune labs posted.  She will increase the dose of PLQ 200  mg BID M-F.  A refill was sent to the pharmacy. She declined a prednisone taper at this time.  She was advised to notify us if she develops any signs or symptoms of a flare.  She will follow up in 5 months. - Plan: CBC with Differential/Platelet, Urinalysis, Routine w reflex microscopic, ANA, Anti-DNA antibody, double-stranded, C3 and C4, Sedimentation rate, CMP14+EGFR  Discoid lupus: She has no discoid lesions and has not had any recent flares.   High risk medication use -Plaquenil 200 mg twice daily Monday through Friday only.  Last Plaquenil eye exam normal on 12/03/2017.  Most recent CMP within normal limits on 10/29/2018.  Most recent CBC within normal limits on 06/11/2018.  Due for CBC today and will monitor CBC/CMP every 5 months. Orders provided to the patient to take to [facility]. - Plan: CBC with Differential/Platelet, CMP14+EGFR  Raynaud's disease without gangrene: She has intermittent but worsening symptoms of Raynaud's.  She has no digital ulcerations or signs of gangrene. Capillary refill 2-3 seconds.  She was encouraged to keep her core body temperature warm and to wear gloves.  She was advised to notify us if she develops ulcerations.   Primary osteoarthritis of both hands: She has PIP and DIP synovial thickening.  She has been having increased pain in both hands and has noticed decreased grip strength.  She has complete fist formation bilaterally.  Joint protection and muscle strengthening were discussed.  A handout of hand exercises was provided.   Primary osteoarthritis of both knees - s/p euflexxa bilateral knees 10/2018: She has no warmth or effusion.  She has good ROM bilaterally.   Primary insomnia: We discussed the importance of good sleep hygiene and the use of melatonin.    Other medical conditions are listed as follows:   History of anxiety  History of DVT (deep vein thrombosis) - She is on Xarelto.  History of pulmonary embolism - She is on Xarelto.  Anticardiolipin  antibody positive - She is on Xarelto.  History of IBS  Family history of hemochromatosis - Her father was recently diagnosed with hemochromatosis   Orders: Orders Placed This Encounter  Procedures  . CBC with  Differential/Platelet  . Urinalysis, Routine w reflex microscopic  . ANA  . Anti-DNA antibody, double-stranded  . C3 and C4  . Sedimentation rate  . CMP14+EGFR   Meds ordered this encounter  Medications  . hydroxychloroquine (PLAQUENIL) 200 MG tablet    Sig: TAKE 1 TABLET BY MOUTH TWICE A DAY MONDAY THROUGH FRIDAY    Dispense:  120 tablet    Refill:  0    Follow-Up Instructions: Return in about 5 months (around 06/21/2019) for Autoimmune Disease, Discoid lupus, Osteoarthritis.   Ofilia Neas, PA-C I examined and evaluated the patient with Hazel Sams PA.  Patient is experiencing pain and discomfort in her bilateral hands with some swelling.  She had reduced her Plaquenil dose after her last visit.  I offered prednisone taper but she declined.  She has synovitis on her MCPs on my examination as described above.  We will increase the dose of Plaquenil back to 200 mg twice daily Monday through Friday.  I will also obtain some autoimmune labs to monitor the disease process.  The plan of care was discussed as noted above.  Bo Merino, MD Note - This record has been created using Editor, commissioning.  Chart creation errors have been sought, but may not always  have been located. Such creation errors do not reflect on  the standard of medical care.

## 2019-01-08 DIAGNOSIS — Z0000 Encounter for general adult medical examination without abnormal findings: Secondary | ICD-10-CM | POA: Diagnosis not present

## 2019-01-12 ENCOUNTER — Telehealth: Payer: Self-pay | Admitting: Rheumatology

## 2019-01-12 MED ORDER — AMPHETAMINE-DEXTROAMPHET ER 20 MG PO CP24: 20.0000 mg | ORAL_CAPSULE | Freq: Every day | ORAL | 0 refills | Status: DC

## 2019-01-12 NOTE — Telephone Encounter (Signed)
Patient called requesting prescription [facility] [address]

## 2019-01-12 NOTE — Telephone Encounter (Signed)
Last Visit: 07/29/18 Next Visit: 01/19/19  Okay to refill Adderall?

## 2019-01-13 DIAGNOSIS — I8312 Varicose veins of left lower extremity with inflammation: Secondary | ICD-10-CM | POA: Diagnosis not present

## 2019-01-14 DIAGNOSIS — D899 Disorder involving the immune mechanism, unspecified: Secondary | ICD-10-CM | POA: Diagnosis not present

## 2019-01-14 DIAGNOSIS — Z0000 Encounter for general adult medical examination without abnormal findings: Secondary | ICD-10-CM | POA: Diagnosis not present

## 2019-01-14 DIAGNOSIS — R636 Underweight: Secondary | ICD-10-CM | POA: Diagnosis not present

## 2019-01-14 DIAGNOSIS — Z832 Family history of diseases of the blood and blood-forming organs and certain disorders involving the immune mechanism: Secondary | ICD-10-CM | POA: Diagnosis not present

## 2019-01-19 ENCOUNTER — Ambulatory Visit: Payer: 59 | Admitting: Rheumatology

## 2019-01-19 ENCOUNTER — Encounter: Payer: Self-pay | Admitting: Rheumatology

## 2019-01-19 ENCOUNTER — Telehealth (INDEPENDENT_AMBULATORY_CARE_PROVIDER_SITE_OTHER): Payer: Self-pay | Admitting: Physician Assistant

## 2019-01-19 VITALS — BP 108/64 | HR 69 | Resp 12 | Ht 64.0 in | Wt 112.8 lb

## 2019-01-19 DIAGNOSIS — F5101 Primary insomnia: Secondary | ICD-10-CM

## 2019-01-19 DIAGNOSIS — Z8349 Family history of other endocrine, nutritional and metabolic diseases: Secondary | ICD-10-CM

## 2019-01-19 DIAGNOSIS — M170 Bilateral primary osteoarthritis of knee: Secondary | ICD-10-CM

## 2019-01-19 DIAGNOSIS — L930 Discoid lupus erythematosus: Secondary | ICD-10-CM | POA: Diagnosis not present

## 2019-01-19 DIAGNOSIS — M19041 Primary osteoarthritis, right hand: Secondary | ICD-10-CM

## 2019-01-19 DIAGNOSIS — M359 Systemic involvement of connective tissue, unspecified: Secondary | ICD-10-CM

## 2019-01-19 DIAGNOSIS — Z8719 Personal history of other diseases of the digestive system: Secondary | ICD-10-CM

## 2019-01-19 DIAGNOSIS — Z79899 Other long term (current) drug therapy: Secondary | ICD-10-CM | POA: Diagnosis not present

## 2019-01-19 DIAGNOSIS — Z86718 Personal history of other venous thrombosis and embolism: Secondary | ICD-10-CM

## 2019-01-19 DIAGNOSIS — Z8659 Personal history of other mental and behavioral disorders: Secondary | ICD-10-CM

## 2019-01-19 DIAGNOSIS — I7300 Raynaud's syndrome without gangrene: Secondary | ICD-10-CM | POA: Diagnosis not present

## 2019-01-19 DIAGNOSIS — M19042 Primary osteoarthritis, left hand: Secondary | ICD-10-CM

## 2019-01-19 DIAGNOSIS — R760 Raised antibody titer: Secondary | ICD-10-CM

## 2019-01-19 DIAGNOSIS — Z86711 Personal history of pulmonary embolism: Secondary | ICD-10-CM

## 2019-01-19 MED ORDER — HYDROXYCHLOROQUINE SULFATE 200 MG PO TABS: ORAL_TABLET | ORAL | 0 refills | Status: DC

## 2019-01-19 NOTE — Telephone Encounter (Signed)
11/02/18 ov note faxed to Dr.Russo @ [facility] [phone]

## 2019-01-19 NOTE — Patient Instructions (Signed)

## 2019-01-28 DIAGNOSIS — Z1231 Encounter for screening mammogram for malignant neoplasm of breast: Secondary | ICD-10-CM | POA: Diagnosis not present

## 2019-01-28 DIAGNOSIS — Z124 Encounter for screening for malignant neoplasm of cervix: Secondary | ICD-10-CM | POA: Diagnosis not present

## 2019-01-28 DIAGNOSIS — Z01419 Encounter for gynecological examination (general) (routine) without abnormal findings: Secondary | ICD-10-CM | POA: Diagnosis not present

## 2019-01-29 DIAGNOSIS — Z79899 Other long term (current) drug therapy: Secondary | ICD-10-CM | POA: Diagnosis not present

## 2019-01-29 DIAGNOSIS — H25043 Posterior subcapsular polar age-related cataract, bilateral: Secondary | ICD-10-CM | POA: Diagnosis not present

## 2019-01-29 DIAGNOSIS — H524 Presbyopia: Secondary | ICD-10-CM | POA: Diagnosis not present

## 2019-01-29 DIAGNOSIS — H5213 Myopia, bilateral: Secondary | ICD-10-CM | POA: Diagnosis not present

## 2019-02-03 DIAGNOSIS — M7981 Nontraumatic hematoma of soft tissue: Secondary | ICD-10-CM | POA: Diagnosis not present

## 2019-02-03 DIAGNOSIS — I8312 Varicose veins of left lower extremity with inflammation: Secondary | ICD-10-CM | POA: Diagnosis not present

## 2019-02-04 ENCOUNTER — Telehealth: Payer: Self-pay | Admitting: Rheumatology

## 2019-02-04 NOTE — Telephone Encounter (Signed)
Patient left a voicemail requesting prescription refill of [facility]

## 2019-02-04 NOTE — Telephone Encounter (Signed)
A 90 day supply was sent to the pharmacy on 01/19/2019. Patient verbalized understanding and will contact the pharmacy.

## 2019-02-11 ENCOUNTER — Telehealth: Payer: Self-pay | Admitting: Rheumatology

## 2019-02-11 MED ORDER — AMPHETAMINE-DEXTROAMPHET ER 20 MG PO CP24: 20.0000 mg | ORAL_CAPSULE | Freq: Every day | ORAL | 0 refills | Status: DC

## 2019-02-11 NOTE — Telephone Encounter (Signed)
Patient called requesting prescription refill of [facility] [address]  Patient states the pain and swelling in her hands has gotten worse.  Patient states she has decided that she would like a low dose of Prednisone.

## 2019-02-11 NOTE — Telephone Encounter (Signed)
Last Visit: 01/19/19 Next Visit: 06/22/19  Okay to refill Adderall?

## 2019-02-22 ENCOUNTER — Telehealth: Payer: Self-pay | Admitting: Rheumatology

## 2019-02-22 MED ORDER — PREDNISONE 5 MG PO TABS: ORAL_TABLET | ORAL | 0 refills | Status: DC

## 2019-02-22 NOTE — Telephone Encounter (Signed)
Spoke directly with Dr. Estanislado Pandy about patient's bilateral hand pain/swelling and Dr. Estanislado Pandy approved a prednisone taper.  10mg  x2 days, 7.5mg  x2 days, 5mg  x2 days, 2.5 mg x2 days. [facility] [address] [facility] patient's request.

## 2019-02-22 NOTE — Telephone Encounter (Signed)
Patient called stating she left a message over a week ago requesting a prescription of Prednisone (low dose) for the pain and swelling in her hands.  Patient states at her appointment on 01/19/19 she was offered Prednisone, but declined it.  Patient states has not received a response regarding the prescription request and is requesting a return call.

## 2019-03-09 ENCOUNTER — Telehealth: Payer: Self-pay | Admitting: Rheumatology

## 2019-03-09 NOTE — Telephone Encounter (Signed)
Patient left a voicemail stating after taking the lower dose of Prednisone for a shorter period of time which she states she requested it didn't work and the pain returned.  Patient is requesting the full amount of Prednisone that Dr. Estanislado Pandy recommended.  Patient requested a return call.

## 2019-03-10 MED ORDER — PREDNISONE 5 MG PO TABS: ORAL_TABLET | ORAL | 0 refills | Status: DC

## 2019-03-10 NOTE — Telephone Encounter (Signed)
Prescription has been sent to the pharmacy, patient has been notified.

## 2019-03-10 NOTE — Telephone Encounter (Signed)
Per conversation with patient on 02/22/2019, she did not want the regular prednisone taper starting at 20mg . Dr. Estanislado Pandy approved a taper of 10mg  x2 days, 7.5mg  x2 days, 5mg  x2 days, 2.5 mg x2 days. Per patient, she did not get relief with this taper and would like the regular taper we prescribe.

## 2019-03-10 NOTE — Telephone Encounter (Signed)
Ok to send in prednisone taper starting at 20 mg, and she will taper by 5 mg every 4 days.

## 2019-03-16 ENCOUNTER — Other Ambulatory Visit: Payer: Self-pay | Admitting: Rheumatology

## 2019-03-16 MED ORDER — AMPHETAMINE-DEXTROAMPHET ER 20 MG PO CP24: 20.0000 mg | ORAL_CAPSULE | Freq: Every day | ORAL | 0 refills | Status: DC

## 2019-03-16 NOTE — Telephone Encounter (Signed)
Ok to refill 

## 2019-03-16 NOTE — Telephone Encounter (Signed)
Patient request [facility]ill on [facility] [address]

## 2019-03-16 NOTE — Telephone Encounter (Signed)
Last Visit: 01/19/2019 Next Visit: 06/22/2019  Last fill: 02/11/2019  Okay to refill adderall?

## 2019-03-17 MED ORDER — AMPHETAMINE-DEXTROAMPHET ER 20 MG PO CP24: 20.0000 mg | ORAL_CAPSULE | Freq: Every day | ORAL | 0 refills | Status: DC

## 2019-03-17 NOTE — Addendum Note (Signed)
Addended by: Earnestine Mealing on: 03/17/2019 08:46 AM   Modules accepted: Orders

## 2019-03-17 NOTE — Addendum Note (Signed)
Addended by: Ofilia Neas on: 03/17/2019 09:11 AM   Modules accepted: Orders

## 2019-03-17 NOTE — Telephone Encounter (Signed)
Can you please try to send adderall via [facility]? Prescription cannot be received via fax.

## 2019-04-13 ENCOUNTER — Other Ambulatory Visit: Payer: Self-pay | Admitting: *Deleted

## 2019-04-13 MED ORDER — AMPHETAMINE-DEXTROAMPHET ER 20 MG PO CP24: 20.0000 mg | ORAL_CAPSULE | Freq: Every day | ORAL | 0 refills | Status: DC

## 2019-04-13 NOTE — Telephone Encounter (Signed)
Patient requesting RF of Adderall [facility] [address].

## 2019-04-13 NOTE — Telephone Encounter (Signed)
Last Visit: 01/19/19 Next Visit: 06/22/19  Okay to refill Adderall?

## 2019-04-25 ENCOUNTER — Other Ambulatory Visit: Payer: Self-pay | Admitting: Physician Assistant

## 2019-04-26 ENCOUNTER — Telehealth: Payer: Self-pay | Admitting: Rheumatology

## 2019-04-26 NOTE — Telephone Encounter (Signed)
Patient left a message stating she was returning Marissa's call to let her know she had her labwork this afternoon 04/26/19.  Patient stated that she will have the results sent to Dr. Estanislado Pandy.

## 2019-04-26 NOTE — Telephone Encounter (Signed)
Last Visit: 01/19/2019 Next Visit: 06/22/2019 Labs: 10/29/2018 Eye exam: 01/29/2019  Attempted to contact patient and left message on machine to advise patient she is due to update labs. Advised patient we did not receive results for labs that were ordered on 01/19/2019.   Okay to refill 30 day supply, per Dr. Estanislado Pandy.

## 2019-04-27 NOTE — Telephone Encounter (Signed)
Noted  

## 2019-04-28 LAB — CMP14+EGFR
ALT: 15 IU/L (ref 0–32)
AST: 18 IU/L (ref 0–40)
Albumin/Globulin Ratio: 1.9 (ref 1.2–2.2)
Albumin: 4.2 g/dL (ref 3.8–4.9)
Alkaline Phosphatase: 62 IU/L (ref 39–117)
BUN/Creatinine Ratio: 12 (ref 9–23)
BUN: 10 mg/dL (ref 6–24)
Bilirubin Total: 0.4 mg/dL (ref 0.0–1.2)
CALCIUM: 8.9 mg/dL (ref 8.7–10.2)
CO2: 24 mmol/L (ref 20–29)
Chloride: 102 mmol/L (ref 96–106)
Creatinine, Ser: 0.83 mg/dL (ref 0.57–1.00)
GFR calc Af Amer: 94 mL/min/{1.73_m2} (ref >59)
GFR calc non Af Amer: 82 mL/min/{1.73_m2} (ref >59)
Globulin, Total: 2.2 g/dL (ref 1.5–4.5)
Glucose: 86 mg/dL (ref 65–99)
POTASSIUM: 4.1 mmol/L (ref 3.5–5.2)
Sodium: 137 mmol/L (ref 134–144)
Total Protein: 6.4 g/dL (ref 6.0–8.5)

## 2019-04-28 LAB — ANA: Anti Nuclear Antibody (ANA): POSITIVE — AB

## 2019-04-28 LAB — CBC WITH DIFFERENTIAL/PLATELET
BASOS ABS: 0.1 10*3/uL (ref 0.0–0.2)
Basos: 2 %
EOS (ABSOLUTE): 0.1 10*3/uL (ref 0.0–0.4)
Eos: 1 %
Hematocrit: 40.6 % (ref 34.0–46.6)
Hemoglobin: 13.5 g/dL (ref 11.1–15.9)
Immature Grans (Abs): 0 10*3/uL (ref 0.0–0.1)
Immature Granulocytes: 0 %
LYMPHS ABS: 2.1 10*3/uL (ref 0.7–3.1)
Lymphs: 29 %
MCH: 30.9 pg (ref 26.6–33.0)
MCHC: 33.3 g/dL (ref 31.5–35.7)
MCV: 93 fL (ref 79–97)
Monocytes Absolute: 0.5 10*3/uL (ref 0.1–0.9)
Monocytes: 7 %
Neutrophils Absolute: 4.6 10*3/uL (ref 1.4–7.0)
Neutrophils: 61 %
Platelets: 173 10*3/uL (ref 150–450)
RBC: 4.37 x10E6/uL (ref 3.77–5.28)
RDW: 11.4 % — ABNORMAL LOW (ref 11.7–15.4)
WBC: 7.4 10*3/uL (ref 3.4–10.8)

## 2019-04-28 LAB — C3 AND C4
Complement C3, Serum: 80 mg/dL — ABNORMAL LOW (ref 82–167)
Complement C4, Serum: 17 mg/dL (ref 14–44)

## 2019-04-28 LAB — URINALYSIS, ROUTINE W REFLEX MICROSCOPIC
Bilirubin, UA: NEGATIVE
GLUCOSE, UA: NEGATIVE
Ketones, UA: NEGATIVE
Nitrite, UA: NEGATIVE
PROTEIN UA: NEGATIVE
RBC, UA: NEGATIVE
Specific Gravity, UA: 1.011 (ref 1.005–1.030)
UUROB: 0.2 mg/dL (ref 0.2–1.0)
pH, UA: 7 (ref 5.0–7.5)

## 2019-04-28 LAB — MICROSCOPIC EXAMINATION
Casts: NONE SEEN /lpf
RBC, Urine: NONE SEEN /hpf (ref 0–2)

## 2019-04-28 LAB — ANTI-DNA ANTIBODY, DOUBLE-STRANDED: dsDNA Ab: 1 IU/mL (ref 0–9)

## 2019-04-28 LAB — SEDIMENTATION RATE: Sed Rate: 6 mm/hr (ref 0–40)

## 2019-04-28 NOTE — Progress Notes (Signed)
UA revealed trace leukocytes which can be a sign of a UTI.  Please advise patient to follow up with PCP if she is symptomatic.  CBC and CMP WNL.  ANA positive. DsDNA is 1.  Sed rate WNL.  C3 borderline low.  Discussed with Dr. Estanislado Pandy and she would like the patient to continue taking plaquenil as prescribed and to recheck sed rate, dsDNA, and complements in 3 months.  Please advise patient to notify us if she develops new or worsening symptoms.

## 2019-04-29 ENCOUNTER — Telehealth: Payer: Self-pay | Admitting: *Deleted

## 2019-04-29 NOTE — Telephone Encounter (Signed)
Please schedule patient to come in tomorrow.

## 2019-04-29 NOTE — Telephone Encounter (Signed)
Spoke with patient, she is away and unable to come into the office tomorrow. Patient has been scheduled for 05/04/19 at 11:30 am.

## 2019-04-29 NOTE — Telephone Encounter (Signed)
Patient states she was given Prednisone taper on 03/10/19. Patient states she has completed that. Patient states that helped for about 2 weeks and now she is having pain and swelling in her right hand. Patient states she she is having some pain in left hand but the right is worse. Patient states she has also noticed pain in her toes and feet. Patient is currently on PLQ twice daily Monday Friday. Patient was last seen January 19, 2019 and due to follow up on 06/22/2019. Please advise.

## 2019-04-30 NOTE — Progress Notes (Signed)
Office Visit Note  Patient: Erica Wood             Date of Birth: 17-Nov-1965           MRN: [id number]             PCP: Shon Baton, MD Referring: Shon Baton, MD Visit Date: 05/04/2019 Occupation: @GUAROCC @  Subjective:  Pain in both hands and both knee joints    History of Present Illness: Erica Wood is a 53 y.o. female with history of autoimmune disease, discoid lupus, and osteoarthritis.  She is taking Plaquenil 200 mg 1 tablet by mouth twice daily Monday through Friday.  She denies missing any doses recently.  She had recent lab work on 04/26/2019 that revealed a low C3.  She has been having increased joint pain in bilateral hands and bilateral knee joints.  She states that her hands swell intermittently.  She is also been having increased pain in bilateral great toes.  She states that she was on a prednisone taper starting on 03/10/2019 which provided temporary joint pain relief.  She denies any recent rashes or discoid lesions.  She denies any alopecia.  She denies any photosensitivity.  She continues to have recurrent nasal ulcerations but denies any oral ulcerations.  She denies any sicca symptoms.  She continues have intermittent symptoms of Raynaud's.  She has no fevers or swollen lymph nodes.     Activities of Daily Living:  Patient reports morning stiffness for 10--15 minutes.   Patient Denies nocturnal pain.  Difficulty dressing/grooming: Denies Difficulty climbing stairs: Reports Difficulty getting out of chair: Reports Difficulty using hands for taps, buttons, cutlery, and/or writing: Reports  Review of Systems  Constitutional: Positive for fatigue.  HENT: Positive for nose dryness (Nose sores). Negative for mouth sores and mouth dryness.   Eyes: Negative for pain, visual disturbance and dryness.  Respiratory: Negative for cough, hemoptysis, shortness of breath and difficulty breathing.   Cardiovascular: Negative for chest pain, palpitations, hypertension and  swelling in legs/feet.  Gastrointestinal: Positive for constipation. Negative for blood in stool and diarrhea.  Endocrine: Negative for increased urination.  Genitourinary: Negative for painful urination.  Musculoskeletal: Positive for arthralgias, joint pain, joint swelling and morning stiffness. Negative for myalgias, muscle weakness, muscle tenderness and myalgias.  Skin: Negative for color change, pallor, rash, hair loss, nodules/bumps, skin tightness, ulcers and sensitivity to sunlight.  Allergic/Immunologic: Negative for susceptible to infections.  Neurological: Negative for dizziness, numbness, headaches and weakness.  Hematological: Negative for swollen glands.  Psychiatric/Behavioral: Negative for depressed mood and sleep disturbance. The patient is not nervous/anxious.     PMFS History:  Patient Active Problem List   Diagnosis Date Noted   Primary osteoarthritis of both knees 05/02/2017   Raynaud's disease without gangrene 11/26/2016   Discoid lupus 11/26/2016   Anticardiolipin antibody positive 11/26/2016   Autoimmune disease ([place]) 11/25/2016   High risk medication use 11/25/2016   Primary osteoarthritis of both hands 11/25/2016   History of DVT (deep vein thrombosis) 11/25/2016   History of pulmonary embolism 11/25/2016   Primary insomnia 11/25/2016   Anxiety 11/25/2016   Irritable bowel syndrome with constipation 11/25/2016   Varicose veins of lower extremities with other complications 41/74/0814   Spider veins of both lower extremities 05/24/2014    Past Medical History:  Diagnosis Date   Anti-cardiolipin antibody positive    Anti-cardiolipin antibody syndrome (HCC)    Anxiety    Arthralgia    Atypical chest pain  Autoimmune disease ([place])    Chronic fatigue    DVT (deep venous thrombosis) (HCC)    Pulmonary embolus (HCC)    Raynaud's syndrome    Sicca (HCC)    Thrombocytopenia (HCC)     Family History  Problem Relation Age of  Onset   Rheum arthritis Father    Hemachromatosis Father    Cancer Sister        bone    Past Surgical History:  Procedure Laterality Date   ABLATION  12/2018   APPENDECTOMY     COLON SURGERY     Perforated colon repair after colostomy   GANGLION CYST EXCISION     HYSTEROSCOPY W/D&C N/A 01/13/2014   Procedure: DILATATION AND CURETTAGE /HYSTEROSCOPY with resection ;  Surgeon: Olga Millers, MD;  Location: [facility];  Service: Gynecology;  Laterality: N/A;   LAPAROTOMY     Social History   Social History Narrative   Not on file    There is no immunization history on file for this patient.   Objective: Vital Signs: BP 106/65 (BP Location: Left Arm, Patient Position: Sitting, Cuff Size: Normal)    Pulse 68    Resp 12    Ht 5\' 4"  (1.626 m)    Wt 111 lb 12.8 oz (50.7 kg)    BMI 19.19 kg/m    Physical Exam Vitals signs and nursing note reviewed.  Constitutional:      Appearance: She is well-developed.  HENT:     Head: Normocephalic and atraumatic.  Eyes:     Conjunctiva/sclera: Conjunctivae normal.  Neck:     Musculoskeletal: Normal range of motion.  Cardiovascular:     Rate and Rhythm: Normal rate and regular rhythm.     Heart sounds: Normal heart sounds.  Pulmonary:     Effort: Pulmonary effort is normal.     Breath sounds: Normal breath sounds.  Abdominal:     General: Bowel sounds are normal.     Palpations: Abdomen is soft.  Lymphadenopathy:     Cervical: No cervical adenopathy.  Skin:    General: Skin is warm and dry.     Capillary Refill: Capillary refill takes less than 2 seconds.  Neurological:     Mental Status: She is alert and oriented to person, place, and time.  Psychiatric:        Behavior: Behavior normal.      Musculoskeletal Exam: C-spine, thoracic spine, lumbar spine good range of motion.  No midline spinal tenderness.  No SI joint tenderness.  Shoulder joints, elbow joints and wrist joints have good range of motion with no discomfort.   She has tenderness of bilateral wrist joints.  She has synovial thickening and synovitis of bilateral 2nd and 3rd MCP joints.  Hip joints, knee joints, ankle joints, MTPs and PIPs, DIPs good range of motion no synovitis.  No warmth or effusion of bilateral knee joints.  No tenderness or swelling of ankle joints.  CDAI Exam: CDAI Score: -- Patient Global: --; Provider Global: -- Swollen: 4 ; Tender: 6  Joint Exam      Right  Left  Wrist   Tender   Tender  MCP 2  Swollen Tender  Swollen Tender  MCP 3  Swollen Tender  Swollen Tender     Investigation: No additional findings.  Imaging: No results found.  Recent Labs: Lab Results  Component Value Date   WBC 7.4 04/26/2019   HGB 13.5 04/26/2019   PLT 173 04/26/2019   NA 137  04/26/2019   K 4.1 04/26/2019   CL 102 04/26/2019   CO2 24 04/26/2019   GLUCOSE 86 04/26/2019   BUN 10 04/26/2019   CREATININE 0.83 04/26/2019   BILITOT 0.4 04/26/2019   ALKPHOS 62 04/26/2019   AST 18 04/26/2019   ALT 15 04/26/2019   PROT 6.4 04/26/2019   ALBUMIN 4.2 04/26/2019   CALCIUM 8.9 04/26/2019   GFRAA 94 04/26/2019    Speciality Comments: PLQ Eye Exam 01/29/19 WNL @ [facility] follow up in 1 year  Procedures:  No procedures performed Allergies: Beef-derived products, Latex, and Sulfa antibiotics   Assessment / Plan:     Visit Diagnoses: Autoimmune disease ([place]) - positive ANA and positive double-stranded DNA -She has active synovitis as described above.  She has been having increased pain in both hands and both knee joints.  She has noticed intermittent swelling in both hands.  She continues to take Plaquenil 200 mg 1 tablet twice daily Monday through Friday.  She has not missed any doses of Plaquenil recently.  She recently had lab work on 04/26/2019 that revealed low C3-80, C4 WNL, sed rate WNL, and dsDNA negative.  She has not noticed any other new or worsening symptoms.  She has not had any recent rashes, discoid lesions, signs  of alopecia, or photosensitivity. She has recurrent nasal ulcerations but no oral ulcerations.  No sicca symptoms.  She will continue taking Plaquenil 200 mg 1 tablet twice daily Monday through Friday.  We will send in a prednisone taper starting at 10 mg and she will taper by 2.5 mg every week.  She was also provided information reviewing the details of Imuran.  She will let us know if she would like to start on Imuran.  A future order for TPMT will be placed today.  She will follow-up in the office in 1 month.  Discoid lupus - She has no discoid lesions on exam.  She has not had any recent rashes or photosensitivity.   High risk medication use - Plaquenil 200 mg 1 tablet twice daily Monday through Friday only.  She is given a handout of information about Imuran.  She would like to review before starting on Imuran.  A future order for TPMT will be placed today.  She will follow-up in the office in 1 month.  Last Plaquenil eye exam normal on 01/29/2019.  Most recent CBC/CMP within normal limits on 04/26/2019 and will monitor every 5 months.  Raynaud's disease without gangrene - She has intermittent symptoms of Raynaud's.  No digital ulcerations or signs of gangrene noted.  Capillary refill 2-3 seconds.  She was encouraged to keep her core body temperature warm.  Primary osteoarthritis of both hands - She has PIP and DIP synovial thickening consistent with osteoarthritis of both hands.  She has complete fist formation bilaterally.  She has been having increased pain in both hands   Primary osteoarthritis of both knees - s/p euflexxa bilateral knees 10/2018: She has chronic pain in bilateral knee joints.  No warmth or effusion was noted.  Primary insomnia: She has chronic insomnia.   Other medical conditions are listed as follows:  History of DVT (deep vein thrombosis) - She is on Xarelto.   History of pulmonary embolism - She is on Xarelto.   Anticardiolipin antibody positive - She is on Xarelto.    History of anxiety   History of IBS   Family history of hemochromatosis - Her father was recently diagnosed with hemochromatosis   Orders:  Orders Placed This Encounter  Procedures   Thiopurine methyltransferase(tpmt)rbc   Meds ordered this encounter  Medications   hydroxychloroquine (PLAQUENIL) 200 MG tablet    Sig: Take 1 tablet by mouth twice daily Monday through Friday only.    Dispense:  120 tablet    Refill:  0   predniSONE (DELTASONE) 5 MG tablet    Sig: Take 2 tablets by mouth daily x1wk, 1.5 tablets by mouth daily x1wk, 1 tablet by mouth daily x1wk, 1/2 tablet by mouth daily x1wk.    Dispense:  35 tablet    Refill:  0    Face-to-face time spent with patient was 30 minutes. Greater than 50% of time was spent in counseling and coordination of care.  Follow-Up Instructions: Return in about 4 weeks (around 06/01/2019) for Autoimmune Disease, Osteoarthritis.   Ofilia Neas, PA-C   I examined and evaluated the patient with Hazel Sams PA.  Patient has synovitis over bilateral second and third MCP joints on my examination.  Her labs also indicate mild flare with hypocomplementemia.  We had detailed discussion regarding different treatment options.  She does not want to take methotrexate.  I have given her a handout on Imuran to review.  I have also given her a low-dose prednisone taper.  She would like to review the information on Imuran and will let us know when she wants to start it.  The plan of care was discussed as noted above.  Bo Merino, MD  Note - This record has been created using Editor, commissioning.  Chart creation errors have been sought, but may not always  have been located. Such creation errors do not reflect on  the standard of medical care.

## 2019-05-04 ENCOUNTER — Ambulatory Visit (INDEPENDENT_AMBULATORY_CARE_PROVIDER_SITE_OTHER): Payer: 59 | Admitting: Rheumatology

## 2019-05-04 ENCOUNTER — Encounter: Payer: Self-pay | Admitting: Rheumatology

## 2019-05-04 ENCOUNTER — Other Ambulatory Visit: Payer: Self-pay

## 2019-05-04 VITALS — BP 106/65 | HR 68 | Resp 12 | Ht 64.0 in | Wt 111.8 lb

## 2019-05-04 DIAGNOSIS — M19041 Primary osteoarthritis, right hand: Secondary | ICD-10-CM

## 2019-05-04 DIAGNOSIS — M170 Bilateral primary osteoarthritis of knee: Secondary | ICD-10-CM

## 2019-05-04 DIAGNOSIS — M359 Systemic involvement of connective tissue, unspecified: Secondary | ICD-10-CM | POA: Diagnosis not present

## 2019-05-04 DIAGNOSIS — Z79899 Other long term (current) drug therapy: Secondary | ICD-10-CM | POA: Diagnosis not present

## 2019-05-04 DIAGNOSIS — I7300 Raynaud's syndrome without gangrene: Secondary | ICD-10-CM | POA: Diagnosis not present

## 2019-05-04 DIAGNOSIS — Z8719 Personal history of other diseases of the digestive system: Secondary | ICD-10-CM

## 2019-05-04 DIAGNOSIS — R760 Raised antibody titer: Secondary | ICD-10-CM

## 2019-05-04 DIAGNOSIS — M19042 Primary osteoarthritis, left hand: Secondary | ICD-10-CM

## 2019-05-04 DIAGNOSIS — F5101 Primary insomnia: Secondary | ICD-10-CM

## 2019-05-04 DIAGNOSIS — L930 Discoid lupus erythematosus: Secondary | ICD-10-CM

## 2019-05-04 DIAGNOSIS — Z8659 Personal history of other mental and behavioral disorders: Secondary | ICD-10-CM

## 2019-05-04 DIAGNOSIS — Z8349 Family history of other endocrine, nutritional and metabolic diseases: Secondary | ICD-10-CM

## 2019-05-04 DIAGNOSIS — Z86711 Personal history of pulmonary embolism: Secondary | ICD-10-CM

## 2019-05-04 DIAGNOSIS — Z86718 Personal history of other venous thrombosis and embolism: Secondary | ICD-10-CM

## 2019-05-04 MED ORDER — HYDROXYCHLOROQUINE SULFATE 200 MG PO TABS: ORAL_TABLET | ORAL | 0 refills | Status: DC

## 2019-05-04 MED ORDER — PREDNISONE 5 MG PO TABS: ORAL_TABLET | ORAL | 0 refills | Status: DC

## 2019-05-04 NOTE — Patient Instructions (Signed)
Azathioprine tablets What is this medicine? AZATHIOPRINE (ay za THYE oh preen) suppresses the immune system. It is used to prevent organ rejection after a transplant. It is also used to treat rheumatoid arthritis. This medicine may be used for other purposes; ask your health care provider or pharmacist if you have questions. COMMON BRAND NAME(S): Azasan, Imuran What should I tell my health care provider before I take this medicine? They need to know if you have any of these conditions: -infection -kidney disease -liver disease -an unusual or allergic reaction to azathioprine, other medicines, lactose, foods, dyes, or preservatives -pregnant or trying to get pregnant -breast feeding How should I use this medicine? Take this medicine by mouth with a full glass of water. Follow the directions on the prescription label. Take your medicine at regular intervals. Do not take your medicine more often than directed. Continue to take your medicine even if you feel better. Do not stop taking except on your doctor's advice. Talk to your pediatrician regarding the use of this medicine in children. Special care may be needed. Overdosage: If you think you have taken too much of this medicine contact a poison control center or emergency room at once. NOTE: This medicine is only for you. Do not share this medicine with others. What if I miss a dose? If you miss a dose, take it as soon as you can. If it is almost time for your next dose, take only that dose. Do not take double or extra doses. What may interact with this medicine? Do not take this medicine with any of the following medications: -febuxostat -mercaptopurine This medicine may also interact with the following medications: -allopurinol -aminosalicylates like sulfasalazine, mesalamine, balsalazide, and olsalazine -leflunomide -medicines called ACE inhibitors like benazepril, captopril, enalapril, fosinopril, quinapril, lisinopril, ramipril, and  trandolapril -mycophenolate -sulfamethoxazole; trimethoprim -vaccines -warfarin This list may not describe all possible interactions. Give your health care provider a list of all the medicines, herbs, non-prescription drugs, or dietary supplements you use. Also tell them if you smoke, drink alcohol, or use illegal drugs. Some items may interact with your medicine. What should I watch for while using this medicine? Visit your doctor or health care professional for regular checks on your progress. You will need frequent blood checks during the first few months you are receiving the medicine. If you get a cold or other infection while receiving this medicine, call your doctor or health care professional. Do not treat yourself. The medicine may increase your risk of getting an infection. Women should inform their doctor if they wish to become pregnant or think they might be pregnant. There is a potential for serious side effects to an unborn child. Talk to your health care professional or pharmacist for more information. Men may have a reduced sperm count while they are taking this medicine. Talk to your health care professional for more information. This medicine may increase your risk of getting certain kinds of cancer. Talk to your doctor about healthy lifestyle choices, important screenings, and your risk. What side effects may I notice from receiving this medicine? Side effects that you should report to your doctor or health care professional as soon as possible: -allergic reactions like skin rash, itching or hives, swelling of the face, lips, or tongue -changes in vision -confusion -fever, chills, or any other sign of infection -loss of balance or coordination -severe stomach pain -unusual bleeding, bruising -unusually weak or tired -vomiting -yellowing of the eyes or skin Side effects that usually do   not require medical attention (report to your doctor or health care professional if they  continue or are bothersome): -hair loss -nausea This list may not describe all possible side effects. Call your doctor for medical advice about side effects. You may report side effects to FDA at 1-800-FDA-1088. Where should I keep my medicine? Keep out of the reach of children. Store at room temperature between 15 and 25 degrees C (59 and 77 degrees F). Protect from light. Throw away any unused medicine after the expiration date. NOTE: This sheet is a summary. It may not cover all possible information. If you have questions about this medicine, talk to your doctor, pharmacist, or health care provider.  2019 Elsevier/Gold Standard (2014-02-22 12:00:31)

## 2019-05-13 ENCOUNTER — Telehealth: Payer: Self-pay | Admitting: Rheumatology

## 2019-05-13 MED ORDER — AMPHETAMINE-DEXTROAMPHET ER 20 MG PO CP24: 20.0000 mg | ORAL_CAPSULE | Freq: Every day | ORAL | 0 refills | Status: DC

## 2019-05-13 NOTE — Telephone Encounter (Signed)
Last Visit: 05/04/19 Next Visit: 06/11/19  Okay to refill Adderall?

## 2019-05-13 NOTE — Telephone Encounter (Signed)
Patient called requesting prescription refill of [facility] [address]  

## 2019-05-17 ENCOUNTER — Telehealth: Payer: Self-pay | Admitting: Rheumatology

## 2019-05-17 MED ORDER — PREDNISONE 5 MG PO TABS: ORAL_TABLET | ORAL | 0 refills | Status: DC

## 2019-05-17 NOTE — Telephone Encounter (Signed)
Patient advised prescription for prednisone was sent to the pharmacy.

## 2019-05-17 NOTE — Telephone Encounter (Signed)
Patient called stating she is taking Prednisone 10 mg and it is not helping with her pain and swelling.  Patient is requesting a higher dosage of Prednisone to be sent to [facility] at [address]

## 2019-05-17 NOTE — Telephone Encounter (Signed)
Per office note on 05/04/19 We will send in a prednisone taper starting at 10 mg and she will taper by 2.5 mg every week  Patient states she is having pain and swelling in her knuckles, fingers and hands. Patient states she is currently on Prednisone 10 mg and has not gotten any relief. Patient is requesting to increase her dose of Prednisone. Please advise.

## 2019-05-17 NOTE — Telephone Encounter (Signed)
Ok to send in prednisone taper starting at 20 mg and tapering by 5 mg every 4 days.

## 2019-05-31 NOTE — Progress Notes (Deleted)
Office Visit Note  Patient: Erica Wood             Date of Birth: [birth year]           MRN: [id number]             PCP: Shon Baton, MD Referring: Shon Baton, MD Visit Date: 06/11/2019 Occupation: @GUAROCC @  Subjective:  No chief complaint on file.   History of Present Illness: Erica Wood is a 53 y.o. female ***   Activities of Daily Living:  Patient reports morning stiffness for *** {minute/hour:19697}.   Patient {ACTIONS;DENIES/REPORTS:21021675::"Denies"} nocturnal pain.  Difficulty dressing/grooming: {ACTIONS;DENIES/REPORTS:21021675::"Denies"} Difficulty climbing stairs: {ACTIONS;DENIES/REPORTS:21021675::"Denies"} Difficulty getting out of chair: {ACTIONS;DENIES/REPORTS:21021675::"Denies"} Difficulty using hands for taps, buttons, cutlery, and/or writing: {ACTIONS;DENIES/REPORTS:21021675::"Denies"}  No Rheumatology ROS completed.   PMFS History:  Patient Active Problem List   Diagnosis Date Noted  . Primary osteoarthritis of both knees 05/02/2017  . Raynaud's disease without gangrene 11/26/2016  . 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 Anticardiolipin antibody positive 11/26/2016  . Autoimmune disease ([place]) 11/25/2016  . High risk medication use 11/25/2016  . Primary osteoarthritis of both hands 11/25/2016  . History of DVT (deep vein thrombosis) 11/25/2016  . History of pulmonary embolism 11/25/2016  . Primary insomnia 11/25/2016  . Anxiety 11/25/2016  . Irritable bowel syndrome with constipation 11/25/2016  . Varicose veins of lower extremities with other complications 35/45/6256  . Spider veins of both lower extremities 05/24/2014    Past Medical History:  Diagnosis Date  . Anti-cardiolipin antibody positive   . Anti-cardiolipin antibody syndrome (HCC)   . Anxiety   . Arthralgia   . Atypical chest pain   . Autoimmune disease ([place])   . Chronic fatigue   . DVT (deep venous thrombosis) ([place])   . Pulmonary embolus ([name])   . Raynaud's syndrome   . Sicca ([place])    . Thrombocytopenia ([place])     Family History  Problem Relation Age of Onset  . Rheum arthritis Father   . Hemachromatosis Father   . Cancer Sister        bone    Past Surgical History:  Procedure Laterality Date  . ABLATION  12/2018  . APPENDECTOMY    . COLON SURGERY     Perforated colon repair after colostomy  . GANGLION CYST EXCISION    . HYSTEROSCOPY W/D&C N/A 01/13/2014   Procedure: DILATATION AND CURETTAGE /HYSTEROSCOPY with resection ;  Surgeon: Olga Millers, MD;  Location: [facility];  Service: Gynecology;  Laterality: N/A;  . LAPAROTOMY     Social History   Social History Narrative  . Not on file    There is no immunization history on file for this patient.   Objective: Vital Signs: There were no vitals taken for this visit.   Physical Exam   Musculoskeletal Exam: ***  CDAI Exam: CDAI Score: - Patient Global: -; Provider Global: - Swollen: -; Tender: - Joint Exam   No joint exam has been documented for this visit   There is currently no information documented on the homunculus. Go to the Rheumatology activity and complete the homunculus joint exam.  Investigation: No additional findings.  Imaging: No results found.  Recent Labs: Lab Results  Component Value Date   WBC 7.4 04/26/2019   HGB 13.5 04/26/2019   PLT 173 04/26/2019   NA 137 04/26/2019   K 4.1 04/26/2019   CL 102 04/26/2019   CO2 24 04/26/2019   GLUCOSE 86 04/26/2019   BUN 10 04/26/2019  CREATININE 0.83 04/26/2019   BILITOT 0.4 04/26/2019   ALKPHOS 62 04/26/2019   AST 18 04/26/2019   ALT 15 04/26/2019   PROT 6.4 04/26/2019   ALBUMIN 4.2 04/26/2019   CALCIUM 8.9 04/26/2019   GFRAA 94 04/26/2019    Speciality Comments: PLQ Eye Exam 01/29/19 WNL @ [facility] follow up in 1 year  Procedures:  No procedures performed Allergies: Beef-derived products, Latex, and Sulfa antibiotics   Assessment / Plan:     Visit Diagnoses: No diagnosis found.  Orders: No orders of  the defined types were placed in this encounter.  No orders of the defined types were placed in this encounter.   Face-to-face time spent with patient was *** minutes. Greater than 50% of time was spent in counseling and coordination of care.  Follow-Up Instructions: No follow-ups on file.   Ofilia Neas, PA-C  Note - This record has been created using Dragon software.  Chart creation errors have been sought, but may not always  have been located. Such creation errors do not reflect on  the standard of medical care.

## 2019-06-10 ENCOUNTER — Telehealth: Payer: Self-pay | Admitting: Rheumatology

## 2019-06-10 MED ORDER — AMPHETAMINE-DEXTROAMPHET ER 20 MG PO CP24: 20.0000 mg | ORAL_CAPSULE | Freq: Every day | ORAL | 0 refills | Status: DC

## 2019-06-10 NOTE — Progress Notes (Signed)
Office Visit Note  Patient: Erica Wood             Date of Birth: 1965/12/07           MRN: [id number]             PCP: Shon Baton, MD Referring: Shon Baton, MD Visit Date: 06/24/2019 Occupation: @GUAROCC @  Subjective:  Discuss Imuran   History of Present Illness: Erica Wood is a 53 y.o. female with history of autoimmune disease and discoid lupus.  Patient is taking Plaquenil 200 mg 1 tablet twice daily.  She noted significant improvement while taking prednisone.  She reports that her pain and swelling in her hands has returned.  She continues to have chronic pain in bilateral knee joints.  She denies any recent rashes or discoid lesions.  She continues to have recurrent nasal ulcerations denies any sores in her mouth.  She experiences Raynaud's in her fingers and toes on a regular basis.  She continues to have significant fatigue and only sleeps 3 to 4 hours per night.  She continues to have hair loss.  She is concerned about the side effects of Imuran.  She read over the side effects and is apprehensive to start at this time   Activities of Daily Living:  Patient reports morning stiffness for 15-30 minutes.   Patient Denies nocturnal pain.  Difficulty dressing/grooming: Denies Difficulty climbing stairs: Denies Difficulty getting out of chair: Denies Difficulty using hands for taps, buttons, cutlery, and/or writing: Reports  Review of Systems  Constitutional: Positive for fatigue.  HENT: Positive for mouth dryness. Negative for mouth sores and nose dryness.        Nose sores   Eyes: Negative for itching and dryness.  Respiratory: Negative for shortness of breath, wheezing and difficulty breathing.   Cardiovascular: Negative for chest pain, palpitations and swelling in legs/feet.  Gastrointestinal: Positive for constipation. Negative for abdominal pain, blood in stool and diarrhea.  Endocrine: Negative for increased urination.  Genitourinary: Negative for painful  urination.  Musculoskeletal: Positive for arthralgias, joint pain, joint swelling and morning stiffness.  Skin: Negative for rash, hair loss and redness.  Allergic/Immunologic: Negative for susceptible to infections.  Neurological: Positive for headaches and weakness. Negative for dizziness and memory loss.  Hematological: Negative for swollen glands.  Psychiatric/Behavioral: Negative for confusion and sleep disturbance.    PMFS History:  Patient Active Problem List   Diagnosis Date Noted  . Primary osteoarthritis of both knees 05/02/2017  . Raynaud's disease without gangrene 11/26/2016  . 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 Anticardiolipin antibody positive 11/26/2016  . Autoimmune disease ([place]) 11/25/2016  . High risk medication use 11/25/2016  . Primary osteoarthritis of both hands 11/25/2016  . History of DVT (deep vein thrombosis) 11/25/2016  . History of pulmonary embolism 11/25/2016  . Primary insomnia 11/25/2016  . Anxiety 11/25/2016  . Irritable bowel syndrome with constipation 11/25/2016  . Varicose veins of lower extremities with other complications 40/81/4481  . Spider veins of both lower extremities 05/24/2014    Past Medical History:  Diagnosis Date  . Anti-cardiolipin antibody positive   . Anti-cardiolipin antibody syndrome (HCC)   . Anxiety   . Arthralgia   . Atypical chest pain   . Autoimmune disease ([place])   . Chronic fatigue   . DVT (deep venous thrombosis) ([place])   . Pulmonary embolus ([place])   . Raynaud's syndrome   . Sicca ([place])   . Thrombocytopenia ([place])     Family History  Problem Relation Age of Onset  . Rheum arthritis Father   . Hemachromatosis Father   . Cancer Sister        bone    Past Surgical History:  Procedure Laterality Date  . ABLATION  12/2018  . APPENDECTOMY    . COLON SURGERY     Perforated colon repair after colostomy  . GANGLION CYST EXCISION    . HYSTEROSCOPY W/D&C N/A 01/13/2014   Procedure: DILATATION AND CURETTAGE /HYSTEROSCOPY  with resection ;  Surgeon: Olga Millers, MD;  Location: [facility];  Service: Gynecology;  Laterality: N/A;  . LAPAROTOMY     Social History   Social History Narrative  . Not on file    There is no immunization history on file for this patient.   Objective: Vital Signs: BP 105/68 (BP Location: Left Arm, Patient Position: Sitting, Cuff Size: Normal)   Pulse 69   Resp 11   Ht 5\' 4"  (1.626 m)   Wt 109 lb 6.4 oz (49.6 kg)   BMI 18.78 kg/m    Physical Exam Vitals signs and nursing note reviewed.  Constitutional:      Appearance: She is well-developed.  HENT:     Head: Normocephalic and atraumatic.  Eyes:     Conjunctiva/sclera: Conjunctivae normal.  Neck:     Musculoskeletal: Normal range of motion.  Cardiovascular:     Rate and Rhythm: Normal rate and regular rhythm.     Heart sounds: Normal heart sounds.  Pulmonary:     Effort: Pulmonary effort is normal.     Breath sounds: Normal breath sounds.  Abdominal:     General: Bowel sounds are normal.     Palpations: Abdomen is soft.  Lymphadenopathy:     Cervical: No cervical adenopathy.  Skin:    General: Skin is warm and dry.     Capillary Refill: Capillary refill takes less than 2 seconds.  Neurological:     Mental Status: She is alert and oriented to person, place, and time.  Psychiatric:        Behavior: Behavior normal.      Musculoskeletal Exam: C-spine, thoracic spine, lumbar spine good range of motion.  No midline spinal tenderness.  No SI joint tenderness.  Shoulder joints, elbow joints, wrist joints, MCPs, PIPs, DIPs good range of motion. She has synovitis of the right 2nd and 3rd Mcps and left 2nd MCP.  Hip joints, knee joints, ankle joints, MCPs, PIPs, DIPs good range of motion with no synovitis.  No warmth or effusion of bilateral knee joints.  No tenderness or swelling of ankle joints.  She has tenderness of bilateral first MTP joints.  CDAI Exam: CDAI Score: - Patient Global: -; Provider Global: -  Swollen: 3 ; Tender: 10  Joint Exam      Right  Left  MCP 2  Swollen Tender  Swollen Tender  MCP 3  Swollen Tender   Tender  MCP 4   Tender   Tender  MCP 5   Tender   Tender  MTP 1   Tender   Tender     Investigation: No additional findings.  Imaging: No results found.  Recent Labs: Lab Results  Component Value Date   WBC 7.4 04/26/2019   HGB 13.5 04/26/2019   PLT 173 04/26/2019   NA 137 04/26/2019   K 4.1 04/26/2019   CL 102 04/26/2019   CO2 24 04/26/2019   GLUCOSE 86 04/26/2019   BUN 10 04/26/2019   CREATININE 0.83 04/26/2019  BILITOT 0.4 04/26/2019   ALKPHOS 62 04/26/2019   AST 18 04/26/2019   ALT 15 04/26/2019   PROT 6.4 04/26/2019   ALBUMIN 4.2 04/26/2019   CALCIUM 8.9 04/26/2019   GFRAA 94 04/26/2019    Speciality Comments: PLQ Eye Exam 01/29/19 WNL @ [facility] follow up in 1 year  Procedures:  No procedures performed Allergies: Beef-derived products, Latex, and Sulfa antibiotics   Assessment / Plan:     Visit Diagnoses: Autoimmune disease ([place]) -  positive ANA and positive double-stranded DNA, hypocomplementemia: She has synovitis of the right second and third MCP and left second MCP joint.  She has tenderness of all MCPs and MTP joints.  She continues to take Plaquenil 200 mg 1 tablet by mouth twice daily Monday through Friday.  She has not had any recent rashes or discoid lesions.  She continues to have intermittent symptoms of Raynaud's but no digital stations or signs of gangrene were noted.  She has recurrent nasal ulcerations and mouth dryness.  She has chronic fatigue and has difficulty sleeping at night.  She denies any palpitations or shortness of breath recently.  She will continue taking Plaquenil as prescribed.  We discussed starting her on Imuran but she is apprehensive of the side effects.  We reviewed indications, contraindications, potential side effects of Imuran today.  All questions were addressed.  She will require T PMT prior  to starting on Imuran.  She would like to review the details of Imuran prior to starting.  She will follow-up in the office in 3 months.  Discoid lupus - She has not had any recent discoid lesions.  High risk medication use - PLQ 200 mg BID M-F.  We discussed starting her on Imuran.  Indications, contraindications, potential side effects were discussed.  She is apprehensive with the potential side effects.  She was reassured that there is no interaction with Xarelto.  We also discussed the low incidence of hair loss.  All questions were addressed.  She will require T PMT and immunosuppressive lab work prior to starting on Imuran.  Raynaud's disease without gangrene -She has intermittent symptoms of Raynaud's in her fingers and toes.  She has slightly delayed capillary refill.  She has no digital ulcerations or signs of gangrene.  She was encouraged to keep her core body temperature warm.  Anticardiolipin antibody positive - She takes Xarelto as prescribed.   Primary osteoarthritis of both hands - She has PIP and DIP synovial thickening consistent with osteoarthritis of bilateral hands.  She has been having increased pain in both hands.  She has some difficulties with ADLs.  Joint protection and muscle strengthening were discussed.  Primary osteoarthritis of both knees -She she has good range of motion with no discomfort.  No warmth or effusion.  She experiences intermittent pain in bilateral knee joints.  She has had Visco gel injections in the past.  Primary insomnia -She has difficulty falling asleep.  She sleeps about 3-4 hours.   History of DVT (deep vein thrombosis) - She takes Xarelto as prescribed.  History of pulmonary embolism - She takes Xarelto as prescribed.  History of anxiety  History of IBS   Family history of hemochromatosis  Orders: No orders of the defined types were placed in this encounter.  No orders of the defined types were placed in this encounter.    Face-to-face time spent with patient was 30 minutes. Greater than 50% of time was spent in counseling and coordination of care.  Follow-Up  Instructions: Return in about 3 months (around 09/24/2019) for Autoimmune Disease.   Ofilia Neas, PA-C  Note - This record has been created using Dragon software.  Chart creation errors have been sought, but may not always  have been located. Such creation errors do not reflect on  the standard of medical care.

## 2019-06-10 NOTE — Telephone Encounter (Signed)
Patinet request refill on Adderal sent to [facility] [address].

## 2019-06-10 NOTE — Telephone Encounter (Signed)
Last Visit: 05/04/19 Next Visit: 06/24/19  Last Fill: 05/13/19  Okay to refill Adderall?

## 2019-06-10 NOTE — Addendum Note (Signed)
Addended by: Bo Merino on: 06/10/2019 04:38 PM   Modules accepted: Orders

## 2019-06-11 ENCOUNTER — Ambulatory Visit: Payer: Self-pay | Admitting: Physician Assistant

## 2019-06-22 ENCOUNTER — Ambulatory Visit: Payer: Self-pay | Admitting: Rheumatology

## 2019-06-24 ENCOUNTER — Ambulatory Visit: Payer: 59 | Admitting: Physician Assistant

## 2019-06-24 ENCOUNTER — Encounter: Payer: Self-pay | Admitting: Physician Assistant

## 2019-06-24 ENCOUNTER — Other Ambulatory Visit: Payer: Self-pay

## 2019-06-24 VITALS — BP 105/68 | HR 69 | Resp 11 | Ht 64.0 in | Wt 109.4 lb

## 2019-06-24 DIAGNOSIS — F5101 Primary insomnia: Secondary | ICD-10-CM

## 2019-06-24 DIAGNOSIS — I7300 Raynaud's syndrome without gangrene: Secondary | ICD-10-CM

## 2019-06-24 DIAGNOSIS — Z79899 Other long term (current) drug therapy: Secondary | ICD-10-CM

## 2019-06-24 DIAGNOSIS — L930 Discoid lupus erythematosus: Secondary | ICD-10-CM

## 2019-06-24 DIAGNOSIS — M19041 Primary osteoarthritis, right hand: Secondary | ICD-10-CM

## 2019-06-24 DIAGNOSIS — Z8719 Personal history of other diseases of the digestive system: Secondary | ICD-10-CM

## 2019-06-24 DIAGNOSIS — M359 Systemic involvement of connective tissue, unspecified: Secondary | ICD-10-CM

## 2019-06-24 DIAGNOSIS — Z86711 Personal history of pulmonary embolism: Secondary | ICD-10-CM

## 2019-06-24 DIAGNOSIS — R760 Raised antibody titer: Secondary | ICD-10-CM

## 2019-06-24 DIAGNOSIS — Z8659 Personal history of other mental and behavioral disorders: Secondary | ICD-10-CM

## 2019-06-24 DIAGNOSIS — M19042 Primary osteoarthritis, left hand: Secondary | ICD-10-CM

## 2019-06-24 DIAGNOSIS — M170 Bilateral primary osteoarthritis of knee: Secondary | ICD-10-CM

## 2019-06-24 DIAGNOSIS — Z8349 Family history of other endocrine, nutritional and metabolic diseases: Secondary | ICD-10-CM

## 2019-06-24 DIAGNOSIS — Z86718 Personal history of other venous thrombosis and embolism: Secondary | ICD-10-CM

## 2019-06-24 NOTE — Progress Notes (Signed)
Pharmacy Note  Subjective: Patient presents today to the [facility] to see Dr. Estanislado Pandy.  Patient seen by the pharmacist for counseling on azathioprine (Imuran) for autoimmune disease.  She is currently on Plaquenil with inadequate response.  Objective: CMP     Component Value Date/Time   NA 137 04/26/2019 1407   K 4.1 04/26/2019 1407   CL 102 04/26/2019 1407   CO2 24 04/26/2019 1407   GLUCOSE 86 04/26/2019 1407   BUN 10 04/26/2019 1407   CREATININE 0.83 04/26/2019 1407   CALCIUM 8.9 04/26/2019 1407   PROT 6.4 04/26/2019 1407   ALBUMIN 4.2 04/26/2019 1407   AST 18 04/26/2019 1407   ALT 15 04/26/2019 1407   ALKPHOS 62 04/26/2019 1407   BILITOT 0.4 04/26/2019 1407   GFRNONAA 82 04/26/2019 1407   GFRAA 94 04/26/2019 1407    CBC    Component Value Date/Time   WBC 7.4 04/26/2019 1407   WBC 6.0 01/11/2014 0910   RBC 4.37 04/26/2019 1407   RBC 4.44 01/11/2014 0910   HGB 13.5 04/26/2019 1407   HCT 40.6 04/26/2019 1407   PLT 173 04/26/2019 1407   MCV 93 04/26/2019 1407   MCH 30.9 04/26/2019 1407   MCH 31.1 01/11/2014 0910   MCHC 33.3 04/26/2019 1407   MCHC 33.7 01/11/2014 0910   RDW 11.4 (L) 04/26/2019 1407   LYMPHSABS 2.1 04/26/2019 1407   EOSABS 0.1 04/26/2019 1407   BASOSABS 0.1 04/26/2019 1407     Assessment/Plan: Patient was counseled on the purpose, proper use, and adverse effects of azathioprine including risk of infection, nausea, rash, and hair loss. Also informed that medication can cause discoloration of urine, sweat and tears. Reviewed risk of cancer after long term use.  Discussed risk of myelosupression and reviewed importance of frequent lab work to monitor blood counts.  Standing orders placed.  Reviewed drug-drug interactions including contraindication with allopurinol.  Provided patient with educational materials on azathioprine and answered all questions.    She is most concerned about Imuran interacting with Xarelto, alopecia, cancer risk,  and stomach upset.  Informed patient that there is no interaction between Imuran and Xarelto.  Informed that alopecia occurs in less than 1% of patient on Imuran and could potentially improve hair loss as her autoimmune disease would be better managed.  Reviewed cancer risk and how it is difficult to attribute to Imuran alone.  Reviewed management strategies to reduce stomach upset such as taking with evening meal.  Patient verbalized understanding.  She is not ready to consent to Imuran.  She would like to think about the medication and call our office if she is ready to start.  Informed patient she will need to consent and have baseline immunosuppressant lab work prior to starting.  Patient verbalized understanding.    She states she is not sleeping well and if there was something she could take over the counter.  Recommended melatonin starting at 1 mg and increasing up to 15 mg as needed.  Also reviewed good sleep hygiene practices such as avoiding electronics, caffeine, and exercise prior to bed.Patient verbalized understanding.  All questions encouraged and answered.  Instructed patient to call with any other questions or concerns.   Mariella Saa, PharmD, [place], [place] Clinical Specialty Pharmacist [phone]  06/24/2019 11:58 AM

## 2019-07-12 ENCOUNTER — Telehealth: Payer: Self-pay | Admitting: Rheumatology

## 2019-07-12 NOTE — Telephone Encounter (Signed)
Last Visit: 06/24/19 Next Visit: 09/23/19  Okay to refill Adderall?

## 2019-07-12 NOTE — Telephone Encounter (Signed)
Patient called requesting prescription refill of [facility] [address]  

## 2019-07-13 MED ORDER — AMPHETAMINE-DEXTROAMPHET ER 20 MG PO CP24: 20.0000 mg | ORAL_CAPSULE | Freq: Every day | ORAL | 0 refills | Status: DC

## 2019-08-11 ENCOUNTER — Telehealth: Payer: Self-pay | Admitting: Rheumatology

## 2019-08-11 MED ORDER — AMPHETAMINE-DEXTROAMPHET ER 20 MG PO CP24: 20.0000 mg | ORAL_CAPSULE | Freq: Every day | ORAL | 0 refills | Status: DC

## 2019-08-11 NOTE — Telephone Encounter (Signed)
Last Visit: 06/24/19 Next Visit: 09/23/19   Okay to refill Adderall?

## 2019-08-11 NOTE — Telephone Encounter (Signed)
Patient called requesting prescription refill of [facility] [address]  

## 2019-08-12 HISTORY — PX: ABLATION: SHX5711

## 2019-09-08 ENCOUNTER — Telehealth: Payer: Self-pay | Admitting: Rheumatology

## 2019-09-08 NOTE — Telephone Encounter (Signed)
Okay to give her the letter.

## 2019-09-08 NOTE — Telephone Encounter (Signed)
Patient called stating she was summoned for jury duty on 09/23/19 and is requesting a note from Dr. Estanislado Pandy excusing her due to her weakened immune system.  Patient requested a return call.

## 2019-09-09 ENCOUNTER — Telehealth: Payer: Self-pay | Admitting: Rheumatology

## 2019-09-09 ENCOUNTER — Encounter: Payer: Self-pay | Admitting: *Deleted

## 2019-09-09 MED ORDER — AMPHETAMINE-DEXTROAMPHET ER 20 MG PO CP24: 20.0000 mg | ORAL_CAPSULE | Freq: Every day | ORAL | 0 refills | Status: DC

## 2019-09-09 NOTE — Telephone Encounter (Signed)
Last Visit: 06/24/19  Next Visit: 09/23/19  Okay to refill Adderall?

## 2019-09-09 NOTE — Telephone Encounter (Signed)
Letter ready for patient pick up.

## 2019-09-09 NOTE — Progress Notes (Signed)
Office Visit Note  Patient: Erica Wood             Date of Birth: 10/13/1966           MRN: [id number]             PCP: Shon Baton, MD Referring: Shon Baton, MD Visit Date: 09/23/2019 Occupation: _0 @  Subjective:  Pain in right hand.   History of Present Illness: Erica Wood is a 52 y.o. female with history of autoimmune disease and discoid lupus.  She states she has been experiencing some discomfort in her right wrist.  She points at her right [facility] joint.  She has some swelling in her hands.  She has been taking Plaquenil twice a day Monday to Friday.  Activities of Daily Living:  Patient reports morning stiffness for several hours.   Patient Denies nocturnal pain.  Difficulty dressing/grooming: Denies Difficulty climbing stairs: Denies Difficulty getting out of chair: Denies Difficulty using hands for taps, buttons, cutlery, and/or writing: Reports  Review of Systems  Constitutional: Positive for fatigue. Negative for night sweats, weight gain and weight loss.  HENT: Negative for mouth sores, trouble swallowing, trouble swallowing, mouth dryness and nose dryness.        Nose sores  Eyes: Negative for pain, redness, itching, visual disturbance and dryness.  Respiratory: Negative for cough, shortness of breath, wheezing and difficulty breathing.   Cardiovascular: Negative for chest pain, palpitations, hypertension, irregular heartbeat and swelling in legs/feet.  Gastrointestinal: Negative for blood in stool, constipation and diarrhea.  Endocrine: Negative for increased urination.  Genitourinary: Negative for painful urination and vaginal dryness.  Musculoskeletal: Positive for arthralgias, joint pain, joint swelling and morning stiffness. Negative for myalgias, muscle weakness, muscle tenderness and myalgias.  Skin: Positive for color change and hair loss. Negative for rash, redness, skin tightness, ulcers and sensitivity to sunlight.  Allergic/Immunologic:  Negative for susceptible to infections.  Neurological: Positive for numbness. Negative for memory loss, night sweats and weakness.  Hematological: Negative for bruising/bleeding tendency and swollen glands.  Psychiatric/Behavioral: Negative for depressed mood, confusion and sleep disturbance. The patient is nervous/anxious.     PMFS History:  Patient Active Problem List   Diagnosis Date Noted  . Primary osteoarthritis of both knees 05/02/2017  . Raynaud's disease without gangrene 11/26/2016  . 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 Anticardiolipin antibody positive 11/26/2016  . Autoimmune disease ([place]) 11/25/2016  . High risk medication use 11/25/2016  . Primary osteoarthritis of both hands 11/25/2016  . History of DVT (deep vein thrombosis) 11/25/2016  . History of pulmonary embolism 11/25/2016  . Primary insomnia 11/25/2016  . Anxiety 11/25/2016  . Irritable bowel syndrome with constipation 11/25/2016  . Varicose veins of lower extremities with other complications 70/26/3785  . Spider veins of both lower extremities 05/24/2014    Past Medical History:  Diagnosis Date  . Anti-cardiolipin antibody positive   . Anti-cardiolipin antibody syndrome (HCC)   . Anxiety   . Arthralgia   . Atypical chest pain   . Autoimmune disease ([place])   . Chronic fatigue   . DVT (deep venous thrombosis) ([place])   . Pulmonary embolus ([place])   . Raynaud's syndrome   . Sicca ([place])   . Thrombocytopenia ([place])     Family History  Problem Relation Age of Onset  . Rheum arthritis Father   . Hemachromatosis Father   . Cancer Sister        bone    Past Surgical History:  Procedure Laterality  Date  . ABLATION  12/2018  . ABLATION Right 08/2019  . APPENDECTOMY    . COLON SURGERY     Perforated colon repair after colostomy  . GANGLION CYST EXCISION    . HYSTEROSCOPY W/D&C N/A 01/13/2014   Procedure: DILATATION AND CURETTAGE /HYSTEROSCOPY with resection ;  Surgeon: Olga Millers, MD;  Location: [facility];   Service: Gynecology;  Laterality: N/A;  . LAPAROTOMY     Social History   Social History Narrative  . Not on file    There is no immunization history on file for this patient.   Objective: Vital Signs: BP 105/69 (BP Location: Right Arm, Patient Position: Sitting, Cuff Size: Normal)   Pulse 67   Resp 13   Ht _0  (1.626 m)   Wt 112 lb 9.6 oz (51.1 kg)   BMI 19.33 kg/m    Physical Exam Vitals signs and nursing note reviewed.  Constitutional:      Appearance: She is well-developed.  HENT:     Head: Normocephalic and atraumatic.  Eyes:     Conjunctiva/sclera: Conjunctivae normal.  Neck:     Musculoskeletal: Normal range of motion.  Cardiovascular:     Rate and Rhythm: Normal rate and regular rhythm.     Heart sounds: Normal heart sounds.  Pulmonary:     Effort: Pulmonary effort is normal.     Breath sounds: Normal breath sounds.  Abdominal:     General: Bowel sounds are normal.     Palpations: Abdomen is soft.  Lymphadenopathy:     Cervical: No cervical adenopathy.  Skin:    General: Skin is warm and dry.     Capillary Refill: Capillary refill takes less than 2 seconds.  Neurological:     Mental Status: She is alert and oriented to person, place, and time.  Psychiatric:        Behavior: Behavior normal.      Musculoskeletal Exam: C-spine thoracic and lumbar spine were in good range of motion.  Shoulder joints elbow joints wrist joint MCPs PIPs DIPs with good range of motion.  She has some thickening over bilateral second MCP joint and some tenderness over right second MCP joint.  She also had tenderness of her right CMC joint.  Hip joints, knee joints, ankles, MTPs PIPs DIPs with good range of motion with no synovitis.  CDAI Exam: CDAI Score: - Patient Global: -; Provider Global: - Swollen: -; Tender: - Joint Exam   No joint exam has been documented for this visit   There is currently no information documented on the homunculus. Go to the Rheumatology  activity and complete the homunculus joint exam.  Investigation: No additional findings.  Imaging: No results found.  Recent Labs: Lab Results  Component Value Date   WBC 7.4 04/26/2019   HGB 13.5 04/26/2019   PLT 173 04/26/2019   NA 137 04/26/2019   K 4.1 04/26/2019   CL 102 04/26/2019   CO2 24 04/26/2019   GLUCOSE 86 04/26/2019   BUN 10 04/26/2019   CREATININE 0.83 04/26/2019   BILITOT 0.4 04/26/2019   ALKPHOS 62 04/26/2019   AST 18 04/26/2019   ALT 15 04/26/2019   PROT 6.4 04/26/2019   ALBUMIN 4.2 04/26/2019   CALCIUM 8.9 04/26/2019   GFRAA 94 04/26/2019    Speciality Comments: PLQ Eye Exam 01/29/19 WNL @ [facility] follow up in 1 year  Procedures:  No procedures performed Allergies: Beef-derived products, Latex, and Sulfa antibiotics   Assessment / Plan:  Visit Diagnoses: Autoimmune disease ([place]) - positive ANA and positive double-stranded DNA, hypocomplementemia.  Patient is clinically doing well today.  She had no synovitis.  She has some tenderness over right second MCP joint.  Discoid lupus-she has no active lesions.  High risk medication use - PLQ 200 mg BID M-F. Discussed Imuran at last visit.  She is not interested in changing her medications at this point.  Raynaud's disease without gangrene-manageable with over-the-counter products.  Anticardiolipin antibody positive  Primary osteoarthritis of both hands-joint protection was discussed.  She has right CMC discomfort.  She states intermittently the pain could be more severe.  I will give her a right CMC brace.  Primary osteoarthritis of both knees-she is currently not having much discomfort.  Primary insomnia-good sleep hygiene discussed.  Other medical problems are listed as follows:  History of DVT (deep vein thrombosis)  History of pulmonary embolism  History of anxiety  History of IBS  Family history of hemochromatosis  Orders: Orders Placed This Encounter  Procedures   . CBC with diff  . UA w reflex microscopic  . ESR  . dsDNA  . C3 and C4  . CMP14+EGFR ([facility])   No orders of the defined types were placed in this encounter.     Follow-Up Instructions: Return in about 5 months (around 02/21/2020) for Autoimmune disease.   Bo Merino, MD  Note - This record has been created using Editor, commissioning.  Chart creation errors have been sought, but may not always  have been located. Such creation errors do not reflect on  the standard of medical care.

## 2019-09-09 NOTE — Telephone Encounter (Signed)
Patient request a [facility] [address]

## 2019-09-23 ENCOUNTER — Other Ambulatory Visit: Payer: Self-pay

## 2019-09-23 ENCOUNTER — Ambulatory Visit: Payer: 59 | Admitting: Rheumatology

## 2019-09-23 ENCOUNTER — Encounter: Payer: Self-pay | Admitting: Physician Assistant

## 2019-09-23 VITALS — BP 105/69 | HR 67 | Resp 13 | Ht 64.0 in | Wt 112.6 lb

## 2019-09-23 DIAGNOSIS — I7300 Raynaud's syndrome without gangrene: Secondary | ICD-10-CM

## 2019-09-23 DIAGNOSIS — M359 Systemic involvement of connective tissue, unspecified: Secondary | ICD-10-CM | POA: Diagnosis not present

## 2019-09-23 DIAGNOSIS — Z79899 Other long term (current) drug therapy: Secondary | ICD-10-CM | POA: Diagnosis not present

## 2019-09-23 DIAGNOSIS — Z8349 Family history of other endocrine, nutritional and metabolic diseases: Secondary | ICD-10-CM

## 2019-09-23 DIAGNOSIS — M170 Bilateral primary osteoarthritis of knee: Secondary | ICD-10-CM

## 2019-09-23 DIAGNOSIS — F5101 Primary insomnia: Secondary | ICD-10-CM

## 2019-09-23 DIAGNOSIS — M19041 Primary osteoarthritis, right hand: Secondary | ICD-10-CM

## 2019-09-23 DIAGNOSIS — R760 Raised antibody titer: Secondary | ICD-10-CM

## 2019-09-23 DIAGNOSIS — L930 Discoid lupus erythematosus: Secondary | ICD-10-CM

## 2019-09-23 DIAGNOSIS — Z8719 Personal history of other diseases of the digestive system: Secondary | ICD-10-CM

## 2019-09-23 DIAGNOSIS — Z86718 Personal history of other venous thrombosis and embolism: Secondary | ICD-10-CM

## 2019-09-23 DIAGNOSIS — Z8659 Personal history of other mental and behavioral disorders: Secondary | ICD-10-CM

## 2019-09-23 DIAGNOSIS — Z86711 Personal history of pulmonary embolism: Secondary | ICD-10-CM

## 2019-09-23 DIAGNOSIS — M19042 Primary osteoarthritis, left hand: Secondary | ICD-10-CM

## 2019-10-11 ENCOUNTER — Telehealth: Payer: Self-pay | Admitting: Rheumatology

## 2019-10-11 MED ORDER — AMPHETAMINE-DEXTROAMPHET ER 20 MG PO CP24: 20.0000 mg | ORAL_CAPSULE | Freq: Every day | ORAL | 0 refills | Status: DC

## 2019-10-11 NOTE — Telephone Encounter (Signed)
Patient called requesting prescription refill of [facility] [address]  Patient states she is going out of town for a week and will be leaving early Wednesday morning 10/13/19 and is asking if there is any way possible her prescription could be called in tomorrow.

## 2019-10-11 NOTE — Telephone Encounter (Signed)
Last visit 09/23/2019 Next visit 02/24/2020 Active labs

## 2019-10-21 ENCOUNTER — Telehealth: Payer: Self-pay | Admitting: Rheumatology

## 2019-10-21 NOTE — Progress Notes (Signed)
All the labs are stable.  Double-stranded DNA is pending.  We can contact patient after all the labs are available.

## 2019-10-21 NOTE — Telephone Encounter (Signed)
Advised patient of UA results, forwarded UA results to Dr. Ouida Sills. Patient will call today for antibiotics.

## 2019-10-21 NOTE — Telephone Encounter (Signed)
Patient requests you call her once Urine results comes in, and you forward those results to Dr. Ouida Sills at [place]. Patient thinks she may has a UTI, and wants to get medication from GYN today if possible. Please call to advise.

## 2019-10-22 LAB — CBC WITH DIFFERENTIAL/PLATELET
Basophils Absolute: 0.1 10*3/uL (ref 0.0–0.2)
Basos: 1 %
EOS (ABSOLUTE): 0 10*3/uL (ref 0.0–0.4)
Eos: 1 %
Hematocrit: 41.6 % (ref 34.0–46.6)
Hemoglobin: 14.1 g/dL (ref 11.1–15.9)
Immature Grans (Abs): 0 10*3/uL (ref 0.0–0.1)
Immature Granulocytes: 0 %
Lymphocytes Absolute: 1.8 10*3/uL (ref 0.7–3.1)
Lymphs: 20 %
MCH: 30.4 pg (ref 26.6–33.0)
MCHC: 33.9 g/dL (ref 31.5–35.7)
MCV: 90 fL (ref 79–97)
Monocytes Absolute: 0.5 10*3/uL (ref 0.1–0.9)
Monocytes: 6 %
Neutrophils Absolute: 6.4 10*3/uL (ref 1.4–7.0)
Neutrophils: 72 %
Platelets: 190 10*3/uL (ref 150–450)
RBC: 4.64 x10E6/uL (ref 3.77–5.28)
RDW: 11 % — ABNORMAL LOW (ref 11.7–15.4)
WBC: 8.8 10*3/uL (ref 3.4–10.8)

## 2019-10-22 LAB — URINALYSIS, ROUTINE W REFLEX MICROSCOPIC
Bilirubin, UA: NEGATIVE
Glucose, UA: NEGATIVE
Ketones, UA: NEGATIVE
Nitrite, UA: NEGATIVE
Protein,UA: NEGATIVE
RBC, UA: NEGATIVE
Specific Gravity, UA: 1.009 (ref 1.005–1.030)
Urobilinogen, Ur: 0.2 mg/dL (ref 0.2–1.0)
pH, UA: 6.5 (ref 5.0–7.5)

## 2019-10-22 LAB — C3 AND C4
Complement C3, Serum: 94 mg/dL (ref 82–167)
Complement C4, Serum: 19 mg/dL (ref 12–38)

## 2019-10-22 LAB — MICROSCOPIC EXAMINATION: Casts: NONE SEEN /lpf

## 2019-10-22 LAB — CMP14+EGFR
ALT: 14 IU/L (ref 0–32)
AST: 16 IU/L (ref 0–40)
Albumin/Globulin Ratio: 2.2 (ref 1.2–2.2)
Albumin: 4.4 g/dL (ref 3.8–4.9)
Alkaline Phosphatase: 70 IU/L (ref 39–117)
BUN/Creatinine Ratio: 14 (ref 9–23)
BUN: 12 mg/dL (ref 6–24)
Bilirubin Total: 0.2 mg/dL (ref 0.0–1.2)
CO2: 25 mmol/L (ref 20–29)
Calcium: 9.2 mg/dL (ref 8.7–10.2)
Chloride: 101 mmol/L (ref 96–106)
Creatinine, Ser: 0.85 mg/dL (ref 0.57–1.00)
GFR calc Af Amer: 92 mL/min/{1.73_m2} (ref >59)
GFR calc non Af Amer: 80 mL/min/{1.73_m2} (ref >59)
Globulin, Total: 2 g/dL (ref 1.5–4.5)
Glucose: 87 mg/dL (ref 65–99)
Potassium: 4.3 mmol/L (ref 3.5–5.2)
Sodium: 138 mmol/L (ref 134–144)
Total Protein: 6.4 g/dL (ref 6.0–8.5)

## 2019-10-22 LAB — SEDIMENTATION RATE: Sed Rate: 9 mm/hr (ref 0–40)

## 2019-10-22 LAB — ANTI-DNA ANTIBODY, DOUBLE-STRANDED: dsDNA Ab: 1 IU/mL (ref 0–9)

## 2019-10-22 NOTE — Progress Notes (Signed)
Labs do not indicate active autoimmune disease.  She has few white cells in the urine but no bacteria were noted.

## 2019-11-09 ENCOUNTER — Telehealth: Payer: Self-pay | Admitting: Rheumatology

## 2019-11-09 MED ORDER — AMPHETAMINE-DEXTROAMPHET ER 20 MG PO CP24: 20.0000 mg | ORAL_CAPSULE | Freq: Every day | ORAL | 0 refills | Status: DC

## 2019-11-09 NOTE — Telephone Encounter (Signed)
Last Visit:09/23/2019 Next Visit: 02/24/2020  Last fill: 10/11/2019  Okay to refill adderall?

## 2019-11-09 NOTE — Telephone Encounter (Signed)
Patient left a voicemail requesting prescription refill of [facility] [address]

## 2019-12-09 ENCOUNTER — Telehealth: Payer: Self-pay | Admitting: Rheumatology

## 2019-12-09 MED ORDER — AMPHETAMINE-DEXTROAMPHET ER 20 MG PO CP24: 20.0000 mg | ORAL_CAPSULE | Freq: Every day | ORAL | 0 refills | Status: DC

## 2019-12-09 NOTE — Telephone Encounter (Signed)
Last visit: 09/23/19 Next Visit: 02/24/20  Okay to refill Adderall?

## 2019-12-09 NOTE — Telephone Encounter (Signed)
Patient needs [facility]ill [facility] [address]

## 2020-01-07 ENCOUNTER — Telehealth: Payer: Self-pay | Admitting: Rheumatology

## 2020-01-07 MED ORDER — AMPHETAMINE-DEXTROAMPHET ER 20 MG PO CP24: 20.0000 mg | ORAL_CAPSULE | Freq: Every day | ORAL | 0 refills | Status: DC

## 2020-01-07 NOTE — Telephone Encounter (Signed)
Last Visit:09/23/2019 Next Visit: 02/24/2020  Last fill: 12/09/2019  Okay to refill adderall?

## 2020-01-07 NOTE — Telephone Encounter (Signed)
Patient called requesting prescription refill of Ad[facility]

## 2020-01-28 ENCOUNTER — Ambulatory Visit: Payer: 59 | Admitting: Family Medicine

## 2020-01-28 ENCOUNTER — Other Ambulatory Visit: Payer: Self-pay

## 2020-01-28 ENCOUNTER — Encounter: Payer: Self-pay | Admitting: Family Medicine

## 2020-01-28 VITALS — BP 114/70 | HR 70 | Resp 100 | Ht 64.0 in | Wt 117.0 lb

## 2020-01-28 DIAGNOSIS — K5900 Constipation, unspecified: Secondary | ICD-10-CM

## 2020-01-28 DIAGNOSIS — R1084 Generalized abdominal pain: Secondary | ICD-10-CM | POA: Diagnosis not present

## 2020-01-28 DIAGNOSIS — R110 Nausea: Secondary | ICD-10-CM | POA: Diagnosis not present

## 2020-01-28 NOTE — Progress Notes (Signed)
Office Visit Note   Patient: Erica Wood           Date of Birth: 08-01-1966           MRN: [id number] Visit Date: 01/28/2020 Requested by: Shon Baton, [place],  [place] [address] PCP: Shon Baton, MD  Subjective: Chief Complaint  Patient presents with  . Nausea  . Constipation    HPI: She is here to establish care.  Her main concern is chronic abdominal pain, constipation and nausea.  She was a full-term baby, vaginal delivery, whose mother breast-fed her for a short time.  She states that when she was a very young child she started having troubles with constipation.  She had an appendectomy at around age 54.  She did not have a lot of childhood illnesses, she did not require a lot of antibiotics.  When she got a little older she was a vegetarian.  She did pretty well until about age 54 when she started having significant troubles with abdominal pain, nausea, and constipation sometimes going a week at a time without having a bowel movement.  He is having those issues to this day.  At 1 point she saw a gastroenterologist and had colonoscopy but developed a perforation as a complication.  She had some other testing which was negative.  Nobody has been able to explain her abdominal symptoms.  Diet is very limited.  Only able to tolerate crackers and some fish.  About 15 years ago she was diagnosed with autoimmune disease.  She had issues with a rash on her chest and various aches and pains in her joints.  She was diagnosed with discoid lupus, anticardiolipin antibody syndrome and Raynaud's disease.  She has been managed with Plaquenil with pretty good results overall.  Her most recent labs look pretty good.  She has a history of DVT with pulmonary embolus and is on Xarelto chronically.  She recently read that Xarelto is contrandicated in anticardiolipin antibody syndrome, but I looked this up and it is for triple positive anticardiolipin antibodies.  From what I can tell,  she does not have that.                ROS: No fevers or chills, no respiratory symptoms.  All other systems were reviewed and are negative.  Objective: Vital Signs: BP 114/70   Pulse 70   Resp (!) 100   Ht 5\' 4"  (1.626 m)   Wt 117 lb (53.1 kg)   BMI 20.08 kg/m   Physical Exam:  General:  Alert and oriented, in no acute distress. Pulm:  Breathing unlabored. Psy:  Normal mood, congruent affect. Skin: There is some erythema of the fingers in both hands.  No other obvious skin abnormality. Neck: 2+ carotid pulses, no bruits.  No thyromegaly, no nodules, no lymphadenopathy. CV: Regular rate and rhythm without murmurs, rubs, or gallops.  No peripheral edema.  2+ radial and posterior tibial pulses. Lungs: Clear to auscultation throughout with no wheezing or areas of consolidation. Abdomen: Bowel sounds are active, no hepatosplenomegaly.  Slightly tender in the left upper quadrant but no rebound or guarding.  There is a midline surgical scar from appendectomy.   Imaging: None today  Assessment & Plan: 1.  Chronic abdominal pain with constipation and nausea, question leaky gut syndrome. - Will order Comprehensive Stool Panel from [facility]. - Depending on results, may try EnteroMend (from [place]), along with probiotics and Saccharomyces. - Gave info on food  elimination diet, but presently only able to eat crackers and fish so might not be able to do this now.     Procedures: No procedures performed  No notes on file     PMFS History: Patient Active Problem List   Diagnosis Date Noted  . Primary osteoarthritis of both knees 05/02/2017  . Raynaud's disease without gangrene 11/26/2016  . 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 Anticardiolipin antibody positive 11/26/2016  . Autoimmune disease ([place]) 11/25/2016  . High risk medication use 11/25/2016  . Primary osteoarthritis of both hands 11/25/2016  . History of DVT (deep vein thrombosis) 11/25/2016  . History of pulmonary embolism  11/25/2016  . Primary insomnia 11/25/2016  . Anxiety 11/25/2016  . Irritable bowel syndrome with constipation 11/25/2016  . Varicose veins of lower extremities with other complications [id number]  . Spider veins of both lower extremities 05/24/2014   Past Medical History:  Diagnosis Date  . Anti-cardiolipin antibody positive   . Anti-cardiolipin antibody syndrome (HCC)   . Anxiety   . Arthralgia   . Atypical chest pain   . Autoimmune disease ([place])   . Chronic fatigue   . DVT (deep venous thrombosis) ([place])   . Pulmonary embolus ([place])   . Raynaud's syndrome   . Sicca ([place])   . Thrombocytopenia ([place])     Family History  Problem Relation Age of Onset  . Rheum arthritis Father   . Hemachromatosis Father   . Cancer Sister        bone     Past Surgical History:  Procedure Laterality Date  . ABLATION  12/2018  . ABLATION Right 08/2019  . APPENDECTOMY    . COLON SURGERY     Perforated colon repair after colostomy  . GANGLION CYST EXCISION    . HYSTEROSCOPY WITH D & C N/A 01/13/2014   Procedure: DILATATION AND CURETTAGE /HYSTEROSCOPY with resection ;  Surgeon: Olga Millers, MD;  Location: [facility];  Service: Gynecology;  Laterality: N/A;  . LAPAROTOMY     Social History   Occupational History  . Not on file  Tobacco Use  . Smoking status: Former Smoker    Packs/day: 0.50    Years: 5.00    Pack years: 2.50    Types: Cigarettes    Start date: 08/06/2006    Quit date: 08/07/2011    Years since quitting: 8.4  . Smokeless tobacco: Never Used  Substance and Sexual Activity  . Alcohol use: Yes    Comment: occasionally  . Drug use: No  . Sexual activity: Not on file

## 2020-01-28 NOTE — Progress Notes (Signed)
Has had stomach pain for years. She states she was going to [facility] for years. States fruit makes stomach pain so much worse. Has had many tests for the stomach. Also wanting to establish PCP.

## 2020-02-08 ENCOUNTER — Telehealth: Payer: Self-pay | Admitting: Rheumatology

## 2020-02-08 MED ORDER — AMPHETAMINE-DEXTROAMPHET ER 20 MG PO CP24: 20.0000 mg | ORAL_CAPSULE | Freq: Every day | ORAL | 0 refills | Status: DC

## 2020-02-08 NOTE — Telephone Encounter (Signed)
Patient called requesting prescription refill of [facility] [address]  

## 2020-02-08 NOTE — Telephone Encounter (Signed)
Last Visit:09/23/2019 Next Visit: 02/24/2020  Last fill: 01/07/20  Okay to refill adderall?

## 2020-02-14 NOTE — Progress Notes (Signed)
 Office Visit Note  Patient: Erica Wood             Date of Birth: 05/31/1966           MRN: [id number]             PCP: Russo, John, MD Referring: Russo, John, MD Visit Date: 02/24/2020 Occupation: @GUAROCC@  Subjective:  Pain in both hands   History of Present Illness: Erica Wood is a 53 y.o. female history of autoimmune disease.  She states she continues to have pain and discomfort in her hands and intermittent swelling.  She describes pain over her bilateral second and third MCP joints.  She also has a Raynaud's symptoms.  She continues to have fatigue.  Patient is concerned about use of Xarelto when positive anticardiolipin antibodies in the past.  Activities of Daily Living:  Patient reports morning stiffness for 30 minutes.   Patient Denies nocturnal pain.  Difficulty dressing/grooming: Denies Difficulty climbing stairs: Reports Difficulty getting out of chair: Denies Difficulty using hands for taps, buttons, cutlery, and/or writing: Reports  Review of Systems  Constitutional: Positive for fatigue. Negative for night sweats, weight gain and weight loss.  HENT: Negative for mouth sores, trouble swallowing, trouble swallowing, mouth dryness and nose dryness.        Nose sores  Eyes: Negative for pain, redness, itching, visual disturbance and dryness.  Respiratory: Negative for cough, shortness of breath and difficulty breathing.   Cardiovascular: Negative for chest pain, palpitations, hypertension, irregular heartbeat and swelling in legs/feet.  Gastrointestinal: Positive for constipation. Negative for blood in stool and diarrhea.  Endocrine: Negative for increased urination.  Genitourinary: Negative for difficulty urinating, painful urination and vaginal dryness.  Musculoskeletal: Positive for arthralgias, joint pain, joint swelling and morning stiffness. Negative for myalgias, muscle weakness, muscle tenderness and myalgias.  Skin: Positive for color change and hair  loss. Negative for rash, redness, skin tightness, ulcers and sensitivity to sunlight.  Allergic/Immunologic: Negative for susceptible to infections.  Neurological: Positive for dizziness, headaches and memory loss. Negative for numbness, night sweats and weakness.  Hematological: Negative for bruising/bleeding tendency and swollen glands.  Psychiatric/Behavioral: Negative for depressed mood, confusion and sleep disturbance. The patient is not nervous/anxious.     PMFS History:  Patient Active Problem List   Diagnosis Date Noted  . Primary osteoarthritis of both knees 05/02/2017  . Raynaud's disease without gangrene 11/26/2016  . 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 Anticardiolipin antibody positive 11/26/2016  . Autoimmune disease (HCC) 11/25/2016  . High risk medication use 11/25/2016  . Primary osteoarthritis of both hands 11/25/2016  . History of DVT (deep vein thrombosis) 11/25/2016  . History of pulmonary embolism 11/25/2016  . Primary insomnia 11/25/2016  . Anxiety 11/25/2016  . Irritable bowel syndrome with constipation 11/25/2016  . Varicose veins of lower extremities with other complications 05/24/2014  . Spider veins of both lower extremities 05/24/2014    Past Medical History:  Diagnosis Date  . Anti-cardiolipin antibody positive   . Anti-cardiolipin antibody syndrome (HCC)   . Anxiety   . Arthralgia   . Atypical chest pain   . Autoimmune disease (HCC)   . Chronic fatigue   . DVT (deep venous thrombosis) (HCC)   . Pulmonary embolus (HCC)   . Raynaud's syndrome   . Sicca (HCC)   . Thrombocytopenia (HCC)     Family History  Problem Relation Age of Onset  . Rheum arthritis Father   . Hemachromatosis Father   . Cancer   Sister        bone    Past Surgical History:  Procedure Laterality Date  . ABLATION  12/2018  . ABLATION Right 08/2019  . APPENDECTOMY    . COLON SURGERY     Perforated colon repair after colostomy  . GANGLION CYST EXCISION    . HYSTEROSCOPY WITH D &  C N/A 01/13/2014   Procedure: DILATATION AND CURETTAGE /HYSTEROSCOPY with resection ;  Surgeon: Mark E Anderson, MD;  Location: [facility];  Service: Gynecology;  Laterality: N/A;  . LAPAROTOMY     Social History   Social History Narrative  . Not on file    There is no immunization history on file for this patient.   Objective: Vital Signs: BP 116/76 (BP Location: Left Arm, Patient Position: Sitting, Cuff Size: Normal)   Pulse 69   Resp 13   Ht 5' 4" (1.626 m)   Wt 118 lb (53.5 kg)   BMI 20.25 kg/m    Physical Exam Vitals and nursing note reviewed.  Constitutional:      Appearance: She is well-developed.  HENT:     Head: Normocephalic and atraumatic.  Eyes:     Conjunctiva/sclera: Conjunctivae normal.  Cardiovascular:     Rate and Rhythm: Normal rate and regular rhythm.     Heart sounds: Normal heart sounds.  Pulmonary:     Effort: Pulmonary effort is normal.     Breath sounds: Normal breath sounds.  Abdominal:     General: Bowel sounds are normal.     Palpations: Abdomen is soft.  Musculoskeletal:     Cervical back: Normal range of motion.  Lymphadenopathy:     Cervical: No cervical adenopathy.  Skin:    General: Skin is warm and dry.     Capillary Refill: Capillary refill takes less than 2 seconds.  Neurological:     Mental Status: She is alert and oriented to person, place, and time.  Psychiatric:        Behavior: Behavior normal.      Musculoskeletal Exam: C-spine thoracic and lumbar spine were in good range of motion.  Shoulder joints, elbow joints, wrist joints with good range of motion.  She has bilateral second and third MCP thickening with some tenderness.  Hip joints, knee joints, ankles, MTPs and PIPs with good range of motion with no synovitis.  CDAI Exam: CDAI Score: -- Patient Global: --; Provider Global: -- Swollen: --; Tender: -- Joint Exam 02/24/2020   No joint exam has been documented for this visit   There is currently no information  documented on the homunculus. Go to the Rheumatology activity and complete the homunculus joint exam.  Investigation: No additional findings.  Imaging: No results found.  Recent Labs: Lab Results  Component Value Date   WBC 8.8 10/20/2019   HGB 14.1 10/20/2019   PLT 190 10/20/2019   NA 138 10/20/2019   K 4.3 10/20/2019   CL 101 10/20/2019   CO2 25 10/20/2019   GLUCOSE 87 10/20/2019   BUN 12 10/20/2019   CREATININE 0.85 10/20/2019   BILITOT 0.2 10/20/2019   ALKPHOS 70 10/20/2019   AST 16 10/20/2019   ALT 14 10/20/2019   PROT 6.4 10/20/2019   ALBUMIN 4.4 10/20/2019   CALCIUM 9.2 10/20/2019   GFRAA 92 10/20/2019    Speciality Comments: PLQ Eye Exam 01/29/19 WNL @ [facility] follow up in 1 year  Procedures:  No procedures performed Allergies: Beef-derived products, Latex, and Sulfa antibiotics   Assessment /   Plan:     Visit Diagnoses: Autoimmune disease ([place]) - positive ANA and positive double-stranded DNA, hypocomplementemia.  Patient had positive anticardiolipin antibody in the past.-She complains of pain and discomfort in her bilateral second and third MCP joints.  She has some tenderness on palpation.  No synovitis was noted.  Synovial thickening was noted.  I will obtain autoimmune labs today.  I will also schedule ultrasound of her bilateral hands to look for synovitis.  She will continue current dose of Plaquenil for now.  Plan: Urinalysis, Routine w reflex microscopic, ANA, Anti-DNA antibody, double-stranded, C3 and C4, Sedimentation rate, Cardiolipin antibodies, IgG, IgM, IgA, VITAMIN D 25 Hydroxy (Vit-D Deficiency, Fractures), Lupus anticoagulant panel, Beta-2-glycoprotein i abs, IgG/M/A  Discoid lupus -patient had no recurrence of symptoms.  High risk medication use - PLQ 200 mg BID M-F. PLQ Eye Exam 02/2020  - Plan: CBC with Differential/Platelet, CMP14+EGFR  Raynaud's disease without gangrene-she continues to have Raynaud's symptoms.  Anticardiolipin  antibody positive-we will repeat the titer today.  Primary osteoarthritis of both hands-she continues to have some stiffness.  Primary osteoarthritis of both knees-doing well currently.  Primary insomnia  History of pulmonary embolism-she is on Xarelto.  Vitamin D deficiency-we will recheck vitamin D level.  History of anxiety  History of DVT (deep vein thrombosis)  History of IBS  Family history of hemochromatosis  Orders: Orders Placed This Encounter  Procedures  . CBC with Differential/Platelet  . Urinalysis, Routine w reflex microscopic  . ANA  . Anti-DNA antibody, double-stranded  . C3 and C4  . Sedimentation rate  . Cardiolipin antibodies, IgG, IgM, IgA  . VITAMIN D 25 Hydroxy (Vit-D Deficiency, Fractures)  . CMP14+EGFR  . Lupus anticoagulant panel  . Beta-2-glycoprotein i abs, IgG/M/A   No orders of the defined types were placed in this encounter.     Follow-Up Instructions: Return in about 5 months (around 07/26/2020) for Autoimmune disease.   Bo Merino, MD  Note - This record has been created using Editor, commissioning.  Chart creation errors have been sought, but may not always  have been located. Such creation errors do not reflect on  the standard of medical care.

## 2020-02-24 ENCOUNTER — Other Ambulatory Visit: Payer: Self-pay

## 2020-02-24 ENCOUNTER — Encounter: Payer: Self-pay | Admitting: Rheumatology

## 2020-02-24 ENCOUNTER — Ambulatory Visit: Payer: 59 | Admitting: Rheumatology

## 2020-02-24 VITALS — BP 116/76 | HR 69 | Resp 13 | Ht 64.0 in | Wt 118.0 lb

## 2020-02-24 DIAGNOSIS — Z79899 Other long term (current) drug therapy: Secondary | ICD-10-CM | POA: Diagnosis not present

## 2020-02-24 DIAGNOSIS — M19041 Primary osteoarthritis, right hand: Secondary | ICD-10-CM

## 2020-02-24 DIAGNOSIS — M170 Bilateral primary osteoarthritis of knee: Secondary | ICD-10-CM

## 2020-02-24 DIAGNOSIS — Z8719 Personal history of other diseases of the digestive system: Secondary | ICD-10-CM

## 2020-02-24 DIAGNOSIS — L930 Discoid lupus erythematosus: Secondary | ICD-10-CM

## 2020-02-24 DIAGNOSIS — M19042 Primary osteoarthritis, left hand: Secondary | ICD-10-CM

## 2020-02-24 DIAGNOSIS — M359 Systemic involvement of connective tissue, unspecified: Secondary | ICD-10-CM | POA: Diagnosis not present

## 2020-02-24 DIAGNOSIS — Z8659 Personal history of other mental and behavioral disorders: Secondary | ICD-10-CM

## 2020-02-24 DIAGNOSIS — Z86711 Personal history of pulmonary embolism: Secondary | ICD-10-CM

## 2020-02-24 DIAGNOSIS — F5101 Primary insomnia: Secondary | ICD-10-CM

## 2020-02-24 DIAGNOSIS — I7300 Raynaud's syndrome without gangrene: Secondary | ICD-10-CM | POA: Diagnosis not present

## 2020-02-24 DIAGNOSIS — Z8349 Family history of other endocrine, nutritional and metabolic diseases: Secondary | ICD-10-CM

## 2020-02-24 DIAGNOSIS — E559 Vitamin D deficiency, unspecified: Secondary | ICD-10-CM

## 2020-02-24 DIAGNOSIS — R760 Raised antibody titer: Secondary | ICD-10-CM

## 2020-02-24 DIAGNOSIS — Z86718 Personal history of other venous thrombosis and embolism: Secondary | ICD-10-CM

## 2020-03-09 ENCOUNTER — Telehealth: Payer: Self-pay | Admitting: Rheumatology

## 2020-03-09 MED ORDER — AMPHETAMINE-DEXTROAMPHET ER 20 MG PO CP24: 20.0000 mg | ORAL_CAPSULE | Freq: Every day | ORAL | 0 refills | Status: DC

## 2020-03-09 NOTE — Telephone Encounter (Signed)
Last Visit: 02/24/2020 Next Visit: 03/29/2020  Last fill: 02/08/2020   Okay to refill adderall?

## 2020-03-09 NOTE — Telephone Encounter (Signed)
Patient called requesting prescription refill of [facility] [address]  

## 2020-03-20 LAB — URINALYSIS, ROUTINE W REFLEX MICROSCOPIC
Bilirubin, UA: NEGATIVE
Glucose, UA: NEGATIVE
Ketones, UA: NEGATIVE
Leukocytes,UA: NEGATIVE
Nitrite, UA: NEGATIVE
Protein,UA: NEGATIVE
RBC, UA: NEGATIVE
Specific Gravity, UA: 1.006 (ref 1.005–1.030)
Urobilinogen, Ur: 0.2 mg/dL (ref 0.2–1.0)
pH, UA: 6.5 (ref 5.0–7.5)

## 2020-03-20 LAB — CMP14+EGFR
ALT: 14 IU/L (ref 0–32)
AST: 20 IU/L (ref 0–40)
Albumin/Globulin Ratio: 2 (ref 1.2–2.2)
Albumin: 4.2 g/dL (ref 3.8–4.9)
Alkaline Phosphatase: 68 IU/L (ref 39–117)
BUN/Creatinine Ratio: 10 (ref 9–23)
BUN: 8 mg/dL (ref 6–24)
Bilirubin Total: 0.3 mg/dL (ref 0.0–1.2)
CO2: 23 mmol/L (ref 20–29)
Calcium: 8.7 mg/dL (ref 8.7–10.2)
Chloride: 105 mmol/L (ref 96–106)
Creatinine, Ser: 0.84 mg/dL (ref 0.57–1.00)
GFR calc Af Amer: 92 mL/min/{1.73_m2} (ref >59)
GFR calc non Af Amer: 80 mL/min/{1.73_m2} (ref >59)
Globulin, Total: 2.1 g/dL (ref 1.5–4.5)
Glucose: 89 mg/dL (ref 65–99)
Potassium: 4 mmol/L (ref 3.5–5.2)
Sodium: 140 mmol/L (ref 134–144)
Total Protein: 6.3 g/dL (ref 6.0–8.5)

## 2020-03-20 LAB — C3 AND C4
Complement C3, Serum: 82 mg/dL (ref 82–167)
Complement C4, Serum: 17 mg/dL (ref 12–38)

## 2020-03-20 LAB — CBC WITH DIFFERENTIAL/PLATELET
Basophils Absolute: 0.1 10*3/uL (ref 0.0–0.2)
Basos: 2 %
EOS (ABSOLUTE): 0 10*3/uL (ref 0.0–0.4)
Eos: 1 %
Hematocrit: 40.6 % (ref 34.0–46.6)
Hemoglobin: 13.6 g/dL (ref 11.1–15.9)
Immature Grans (Abs): 0 10*3/uL (ref 0.0–0.1)
Immature Granulocytes: 0 %
Lymphocytes Absolute: 1.8 10*3/uL (ref 0.7–3.1)
Lymphs: 34 %
MCH: 30.2 pg (ref 26.6–33.0)
MCHC: 33.5 g/dL (ref 31.5–35.7)
MCV: 90 fL (ref 79–97)
Monocytes Absolute: 0.4 10*3/uL (ref 0.1–0.9)
Monocytes: 8 %
Neutrophils Absolute: 3 10*3/uL (ref 1.4–7.0)
Neutrophils: 55 %
Platelets: 158 10*3/uL (ref 150–450)
RBC: 4.51 x10E6/uL (ref 3.77–5.28)
RDW: 11.8 % (ref 11.7–15.4)
WBC: 5.4 10*3/uL (ref 3.4–10.8)

## 2020-03-20 LAB — ANTI-DNA ANTIBODY, DOUBLE-STRANDED: dsDNA Ab: 1 IU/mL (ref 0–9)

## 2020-03-20 LAB — LUPUS ANTICOAGULANT PANEL
Dilute Viper Venom Time: 53.1 s — ABNORMAL HIGH (ref 0.0–47.0)
PTT Lupus Anticoagulant: 40 s (ref 0.0–51.9)

## 2020-03-20 LAB — CARDIOLIPIN ANTIBODIES, IGG, IGM, IGA
Anticardiolipin IgA: <9 APL U/mL (ref 0–11)
Anticardiolipin IgG: <9 GPL U/mL (ref 0–14)
Anticardiolipin IgM: 31 MPL U/mL — ABNORMAL HIGH (ref 0–12)

## 2020-03-20 LAB — VITAMIN D 25 HYDROXY (VIT D DEFICIENCY, FRACTURES): Vit D, 25-Hydroxy: 43.9 ng/mL (ref 30.0–100.0)

## 2020-03-20 LAB — DRVVT CONFIRM: dRVVT Confirm: 1.3 ratio — ABNORMAL HIGH (ref 0.8–1.2)

## 2020-03-20 LAB — ANA: Anti Nuclear Antibody (ANA): POSITIVE — AB

## 2020-03-20 LAB — BETA-2-GLYCOPROTEIN I ABS, IGG/M/A
Beta-2 Glyco 1 IgA: <9 GPI IgA units (ref 0–25)
Beta-2 Glyco 1 IgM: 12 GPI IgM units (ref 0–32)
Beta-2 Glyco I IgG: <9 GPI IgG units (ref 0–20)

## 2020-03-20 LAB — SEDIMENTATION RATE: Sed Rate: 7 mm/hr (ref 0–40)

## 2020-03-20 LAB — DRVVT MIX: dRVVT Mix: 40.8 s — ABNORMAL HIGH (ref 0.0–40.4)

## 2020-03-20 NOTE — Progress Notes (Signed)
Please asked patient if she would prefer to see a hematologist.  We can refer her.

## 2020-03-20 NOTE — Progress Notes (Signed)
Lupus anticoagulant is positive which has been positive in the past.  Anticardiolipin IgM is low titer positive.  Patient is on Xarelto.  Patient is concerned that Xarelto may not be as effective in preventing blood clots as Coumadin.  I advised her to discuss this further with hematologist.  She states she has been seeing Dr. Virgina Jock.  I will forward labs to Dr. Virgina Jock.

## 2020-03-20 NOTE — Progress Notes (Signed)
ANA and anticardiolipin's are positive which are stable.  Lupus anticoagulant is pending at this time.  Labs do not indicate active autoimmune disease.  If patient is a still having joint pain we can schedule ultrasound of bilateral hands as discussed during the last visit.

## 2020-03-21 ENCOUNTER — Telehealth: Payer: Self-pay | Admitting: Rheumatology

## 2020-03-21 DIAGNOSIS — R760 Raised antibody titer: Secondary | ICD-10-CM

## 2020-03-21 NOTE — Telephone Encounter (Signed)
Referral placed to hematologist.

## 2020-03-21 NOTE — Telephone Encounter (Signed)
Patient called with the name of the Hematologist she wants to be referred to:  Dr. Christianne Dolin at [facility] in [place] [phone]  Patient states she also has an office in [place] [phone].

## 2020-03-23 ENCOUNTER — Telehealth: Payer: Self-pay | Admitting: Rheumatology

## 2020-03-23 NOTE — Telephone Encounter (Signed)
Erica Wood from [facility] left a voicemail to let Dr. Estanislado Pandy know that patient has been scheduled with Dr. Prudence Davidson for 03/29/20 at 11:00 am.

## 2020-03-29 ENCOUNTER — Other Ambulatory Visit: Payer: 59 | Admitting: Rheumatology

## 2020-04-05 ENCOUNTER — Other Ambulatory Visit: Payer: Self-pay | Admitting: Family Medicine

## 2020-04-05 ENCOUNTER — Encounter: Payer: Self-pay | Admitting: Family Medicine

## 2020-04-05 DIAGNOSIS — Z86711 Personal history of pulmonary embolism: Secondary | ICD-10-CM

## 2020-04-05 DIAGNOSIS — R760 Raised antibody titer: Secondary | ICD-10-CM

## 2020-04-05 DIAGNOSIS — M359 Systemic involvement of connective tissue, unspecified: Secondary | ICD-10-CM

## 2020-04-05 DIAGNOSIS — R600 Localized edema: Secondary | ICD-10-CM

## 2020-04-07 ENCOUNTER — Other Ambulatory Visit: Payer: Self-pay | Admitting: Family Medicine

## 2020-04-07 MED ORDER — RIVAROXABAN 15 MG PO TABS: 15.0000 mg | ORAL_TABLET | Freq: Every day | ORAL | 3 refills | Status: DC

## 2020-04-11 ENCOUNTER — Telehealth: Payer: Self-pay | Admitting: Rheumatology

## 2020-04-11 MED ORDER — AMPHETAMINE-DEXTROAMPHET ER 20 MG PO CP24: 20.0000 mg | ORAL_CAPSULE | Freq: Every day | ORAL | 0 refills | Status: DC

## 2020-04-11 NOTE — Telephone Encounter (Signed)
Last Visit: 02/24/2020 Next Visit: 07/28/2020  Okay to refill Adderall?  

## 2020-04-11 NOTE — Telephone Encounter (Signed)
Patient called requesting prescription refill of [facility] [address]  

## 2020-04-12 ENCOUNTER — Other Ambulatory Visit: Payer: Self-pay

## 2020-04-12 ENCOUNTER — Telehealth: Payer: Self-pay | Admitting: Family Medicine

## 2020-04-12 ENCOUNTER — Ambulatory Visit (HOSPITAL_COMMUNITY)
Admission: RE | Admit: 2020-04-12 | Discharge: 2020-04-12 | Disposition: A | Discharge status: Home / Self Care | Payer: 59 | Source: Ambulatory Visit | Attending: Family Medicine | Admitting: Family Medicine

## 2020-04-12 DIAGNOSIS — R600 Localized edema: Secondary | ICD-10-CM

## 2020-04-12 DIAGNOSIS — I2699 Other pulmonary embolism without acute cor pulmonale: Secondary | ICD-10-CM | POA: Insufficient documentation

## 2020-04-12 DIAGNOSIS — Z87891 Personal history of nicotine dependence: Secondary | ICD-10-CM | POA: Insufficient documentation

## 2020-04-12 DIAGNOSIS — Z86711 Personal history of pulmonary embolism: Secondary | ICD-10-CM

## 2020-04-12 DIAGNOSIS — Z86718 Personal history of other venous thrombosis and embolism: Secondary | ICD-10-CM | POA: Diagnosis not present

## 2020-04-12 NOTE — Progress Notes (Signed)
  Echocardiogram 2D Echocardiogram has been performed.  Erica Wood 04/12/2020, 1:46 PM

## 2020-04-12 NOTE — Telephone Encounter (Signed)
Note sent to patient regarding Erica Wood results:  You scored highly (not in a good way!) in the dysbiosis category.  The pattern suggests small intestinal bacterial overgrowth (SIBO).  Here's some info from Dr. Geri Seminole, who's very knowledgeable in this:  [email or web address]cy  Options at this point would include:  - Do additional testing to confirm:  [email or web address] - Go ahead and try some treatments to see if symptoms improve.  Let me know how you'd like to proceed.

## 2020-04-12 NOTE — Telephone Encounter (Signed)
Echocardiogram looks normal.

## 2020-04-26 ENCOUNTER — Other Ambulatory Visit: Payer: Self-pay | Admitting: Family Medicine

## 2020-04-26 ENCOUNTER — Telehealth: Payer: Self-pay | Admitting: Family Medicine

## 2020-04-26 MED ORDER — RIVAROXABAN 15 MG PO TABS
15.0000 mg | ORAL_TABLET | Freq: Every day | ORAL | 3 refills | Status: DC
Start: 2020-04-26 — End: 2020-07-27

## 2020-04-26 NOTE — Telephone Encounter (Signed)
Called patient left voicemail message to return call concerning appointment with Dr Junius Roads  Per patient's request

## 2020-05-05 ENCOUNTER — Other Ambulatory Visit: Payer: Self-pay

## 2020-05-05 ENCOUNTER — Encounter: Payer: Self-pay | Admitting: Family Medicine

## 2020-05-05 ENCOUNTER — Ambulatory Visit (INDEPENDENT_AMBULATORY_CARE_PROVIDER_SITE_OTHER): Payer: 59 | Admitting: Family Medicine

## 2020-05-05 DIAGNOSIS — M1711 Unilateral primary osteoarthritis, right knee: Secondary | ICD-10-CM | POA: Diagnosis not present

## 2020-05-05 DIAGNOSIS — R1084 Generalized abdominal pain: Secondary | ICD-10-CM

## 2020-05-05 DIAGNOSIS — R600 Localized edema: Secondary | ICD-10-CM

## 2020-05-05 DIAGNOSIS — M1712 Unilateral primary osteoarthritis, left knee: Secondary | ICD-10-CM | POA: Diagnosis not present

## 2020-05-05 DIAGNOSIS — K5900 Constipation, unspecified: Secondary | ICD-10-CM

## 2020-05-05 DIAGNOSIS — Z86711 Personal history of pulmonary embolism: Secondary | ICD-10-CM

## 2020-05-05 DIAGNOSIS — R760 Raised antibody titer: Secondary | ICD-10-CM

## 2020-05-05 MED ORDER — DOXYCYCLINE HYCLATE 100 MG PO CAPS: 100.0000 mg | ORAL_CAPSULE | Freq: Two times a day (BID) | ORAL | 0 refills | Status: DC

## 2020-05-05 NOTE — Progress Notes (Signed)
Office Visit Note   Patient: Erica Wood           Date of Birth: 1966-10-23           MRN: [id number] Visit Date: 05/05/2020 Requested by: Shon Baton, [place] [place],  [place] [address] PCP: Shon Baton, MD  Subjective: Chief Complaint  Patient presents with  . discuss stool tests results  . was bitten by a tick 2 days ago, on stomach    HPI: She is here with multiple concerns today.  She recently removed a tick from her abdomen, about 2 days ago.  She is not sure how long it was there.  Her knees are bothering her again.  She has a history of medial compartment osteoarthritis and has done well with viscosupplementation in the past.  Her last injections were in December.  Cortisone injections did not help in the past.  She has not yet heard from the Coumadin clinic so she is still on Xarelto.  She has intermittent edema in her legs.  She wondered whether it could be related to her ongoing abdominal issues.  She continues to have abdominal pain, bloating, constipation.  Her stool testing was notable for dysbiosis, with concern for possible small intestinal bacterial overgrowth.                ROS:   All other systems were reviewed and are negative.  Objective: Vital Signs: There were no vitals taken for this visit.  Physical Exam:  General:  Alert and oriented, in no acute distress. Pulm:  Breathing unlabored. Psy:  Normal mood, congruent affect. Skin: In the right lower abdominal wall she has an erythematous spot from the recent tick bite. Knees: Trace effusion bilaterally with no warmth or erythema.  Both knees are tender on the medial joint line.  Full range of motion of the knees.   Imaging: No results found.  Assessment & Plan: 1.  Tick bite -we will treat with doxycycline.  2.  Bilateral knee osteoarthritis -we will request approval for gel injections.  3.  Anticardiolipin antibody with history of pulmonary embolus -referral to  Coumadin clinic  4.  Chronic abdominal pain with possible SIBO. - Additional labs.     Procedures: No procedures performed  No notes on file     PMFS History: Patient Active Problem List   Diagnosis Date Noted  . Primary osteoarthritis of both knees 05/02/2017  . Raynaud's disease without gangrene 11/26/2016  . 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 Anticardiolipin antibody positive 11/26/2016  . Autoimmune disease ([place]) 11/25/2016  . High risk medication use 11/25/2016  . Primary osteoarthritis of both hands 11/25/2016  . History of DVT (deep vein thrombosis) 11/25/2016  . History of pulmonary embolism 11/25/2016  . Primary insomnia 11/25/2016  . Anxiety 11/25/2016  . Irritable bowel syndrome with constipation 11/25/2016  . Varicose veins of lower extremities with other complications 37/85/8850  . Spider veins of both lower extremities 05/24/2014   Past Medical History:  Diagnosis Date  . Anti-cardiolipin antibody positive   . Anti-cardiolipin antibody syndrome (HCC)   . Anxiety   . Arthralgia   . Atypical chest pain   . Autoimmune disease ([place])   . Chronic fatigue   . DVT (deep venous thrombosis) ([place])   . Pulmonary embolus ([place])   . Raynaud's syndrome   . Sicca ([place])   . Thrombocytopenia ([place])     Family History  Problem Relation Age of Onset  .  Rheum arthritis Father   . Hemachromatosis Father   . Cancer Sister        bone     Past Surgical History:  Procedure Laterality Date  . ABLATION  12/2018  . ABLATION Right 08/2019  . APPENDECTOMY    . COLON SURGERY     Perforated colon repair after colostomy  . GANGLION CYST EXCISION    . HYSTEROSCOPY WITH D & C N/A 01/13/2014   Procedure: DILATATION AND CURETTAGE /HYSTEROSCOPY with resection ;  Surgeon: Olga Millers, MD;  Location: [facility];  Service: Gynecology;  Laterality: N/A;  . LAPAROTOMY     Social History   Occupational History  . Not on file  Tobacco Use  . Smoking status: Former Smoker    Packs/day:  0.50    Years: 5.00    Pack years: 2.50    Types: Cigarettes    Start date: 08/06/2006    Quit date: 08/07/2011    Years since quitting: 8.7  . Smokeless tobacco: Never Used  Vaping Use  . Vaping Use: Never used  Substance and Sexual Activity  . Alcohol use: Yes    Comment: occasionally  . Drug use: No  . Sexual activity: Not on file

## 2020-05-08 ENCOUNTER — Encounter: Payer: Self-pay | Admitting: Family Medicine

## 2020-05-09 ENCOUNTER — Telehealth: Payer: Self-pay | Admitting: Rheumatology

## 2020-05-09 MED ORDER — AMPHETAMINE-DEXTROAMPHET ER 20 MG PO CP24: 20.0000 mg | ORAL_CAPSULE | Freq: Every day | ORAL | 0 refills | Status: DC

## 2020-05-09 NOTE — Telephone Encounter (Signed)
Patient called requesting prescription refill of [facility] [address]

## 2020-05-09 NOTE — Telephone Encounter (Signed)
Last Visit: 02/24/2020 Next Visit: 07/28/2020  Okay to refill Adderall?

## 2020-05-09 NOTE — Progress Notes (Signed)
Will submit for Durolane, bilateral knee.

## 2020-05-17 ENCOUNTER — Telehealth: Payer: Self-pay

## 2020-05-17 ENCOUNTER — Telehealth: Payer: Self-pay | Admitting: *Deleted

## 2020-05-17 NOTE — Telephone Encounter (Signed)
Submitted VOB, Euflexxa series, bilateral knee.

## 2020-05-17 NOTE — Telephone Encounter (Signed)
Referral was sent to [facility] and wellness center.

## 2020-05-17 NOTE — Telephone Encounter (Signed)
-----   Message from Eunice Blase, MD sent at 05/12/2020  2:38 PM EDT ----- Regarding: RE: [facility] Diagnosis is anticardiolipin antibody syndrome with history of pulmonary embolus.  Is on Xarelto, but would prefer to be on coumadin/warfarin.      ----- Message ----- From: Maureen Chatters, CMA Sent: 05/12/2020   1:30 PM EDT To: Eunice Blase, MD Subject: RE: [facility]                     I contacte the coumadin clinic with [facility] and spoke with Jenny Reichmann and she states she is needing in notes What exactly pt is on now, when she was put on it, why she is on it and why making switch or just want to be off it. Says she had read in [facility] that pt wanted to be off warfarin previously and wants to know why we are doing it now. Please advise on this for a better clarification. thanks ----- Message ----- From: Eunice Blase, MD Sent: 05/12/2020  10:11 AM EDT To: Maureen Chatters, CMA Subject: [facility]                         Pt requests referral to [facility] instead of [facility], since [facility] require cardiology consult first.

## 2020-05-19 ENCOUNTER — Telehealth: Payer: Self-pay

## 2020-05-19 NOTE — Telephone Encounter (Signed)
Talked with patient to let her know that she was approved for euflexxa injections, but patient prefers to get injections through specialty pharmacy.  Submitted for pharmacy benefits.

## 2020-05-22 ENCOUNTER — Telehealth: Payer: Self-pay

## 2020-05-22 NOTE — Telephone Encounter (Signed)
-  Transferred request to [facility] [address] for Euflexxa, bilateral knee.  -PA required for Eulfexxa, bilateral knee.  Uploaded office notes to Euflexxa portal to start authorization.  -Faxed Rx to [facility] for Eulfexxa, bilateral knee.

## 2020-05-29 ENCOUNTER — Telehealth: Payer: Self-pay

## 2020-05-29 NOTE — Telephone Encounter (Signed)
Received fax from [facility] for additional clinic information. Faxed completed form to [facility] at [phone].

## 2020-06-07 ENCOUNTER — Telehealth: Payer: Self-pay

## 2020-06-07 NOTE — Telephone Encounter (Signed)
PA was required for Euflexxa, bilateral knee through [facility]. PA has been approved for Euflexxa, bilateral knee. Valid 06/07/2020- 06/28/2020.   Will wait for phone call to have medication delivered to out office.

## 2020-06-08 ENCOUNTER — Telehealth: Payer: Self-pay | Admitting: Rheumatology

## 2020-06-08 MED ORDER — AMPHETAMINE-DEXTROAMPHET ER 20 MG PO CP24: 20.0000 mg | ORAL_CAPSULE | Freq: Every day | ORAL | 0 refills | Status: DC

## 2020-06-08 NOTE — Telephone Encounter (Signed)
Last Visit: 02/24/2020 Next Visit: 07/27/2020  Last Fill: 05/09/2020  Okay to refill Adderall?

## 2020-06-08 NOTE — Telephone Encounter (Signed)
Patient called requesting prescription refill of [facility] [address]

## 2020-06-09 ENCOUNTER — Telehealth: Payer: Self-pay

## 2020-06-09 NOTE — Telephone Encounter (Signed)
Talked with patient and she stated that she would give [facility] a call to pay for medication.

## 2020-06-09 NOTE — Telephone Encounter (Signed)
Talked with Joe at [facility] to verify that PA has been approved for Euflexxa, bilateral knee.  Per Wille Glaser, PA is approved and the pharmacy will need to run claim for the medication.  Joe provided me with [facility] pharmacy number to proved to the patient.  [phone] was given to patient to call.

## 2020-06-12 ENCOUNTER — Telehealth: Payer: Self-pay | Admitting: *Deleted

## 2020-06-12 NOTE — Telephone Encounter (Signed)
Patient contacted the office regarding regarding the Covid vaccine. Patient advised of the following:    COVID-19 vaccine recommendations:   COVID-19 vaccine is recommended for everyone (unless you are allergic to a vaccine component), even if you are on a medication that suppresses your immune system.  Do not take Tylenol or ant anti-inflammatory medications (NSAIDs) 24 hours prior to the COVID-19 vaccination.   There is no direct evidence about the efficacy of the COVID-19 vaccine in individuals who are on medications that suppress the immune system.   Even if you are fully vaccinated, and you are on any medications that suppress your immune system, please continue to wear a mask, maintain at least six feet social distance and practice hand hygiene.   If you develop a COVID-19 infection, please contact your PCP or our office to determine if you need antibody infusion.  We anticipate that a booster vaccine will be available soon for immunosuppressed individuals. Please cal our office before receiving your booster dose to make adjustments to your medication regimen.

## 2020-06-23 ENCOUNTER — Ambulatory Visit: Payer: 59 | Admitting: Family Medicine

## 2020-06-23 ENCOUNTER — Telehealth: Payer: Self-pay

## 2020-06-23 NOTE — Telephone Encounter (Signed)
Talked with patient and advised her that Euflexxa for bilateral knee will be delivered on Tuesday, 06/27/2020 per Eddie Dibbles with [facility]. Order# [id number] Confirmed office address for delivery of Eulfexxa.

## 2020-06-27 ENCOUNTER — Telehealth: Payer: Self-pay

## 2020-06-27 NOTE — Telephone Encounter (Signed)
Euflexxa injection for bilateral knee was delivered to the office, 06/27/2020.

## 2020-06-28 ENCOUNTER — Ambulatory Visit: Payer: 59 | Admitting: Family Medicine

## 2020-06-28 ENCOUNTER — Other Ambulatory Visit: Payer: Self-pay

## 2020-06-28 DIAGNOSIS — M1711 Unilateral primary osteoarthritis, right knee: Secondary | ICD-10-CM | POA: Diagnosis not present

## 2020-06-28 DIAGNOSIS — M170 Bilateral primary osteoarthritis of knee: Secondary | ICD-10-CM | POA: Diagnosis not present

## 2020-06-28 DIAGNOSIS — R760 Raised antibody titer: Secondary | ICD-10-CM

## 2020-06-28 DIAGNOSIS — M1712 Unilateral primary osteoarthritis, left knee: Secondary | ICD-10-CM

## 2020-06-28 MED ORDER — WARFARIN SODIUM 5 MG PO TABS: 5.0000 mg | ORAL_TABLET | Freq: Every day | ORAL | 11 refills | Status: DC

## 2020-06-28 NOTE — Progress Notes (Signed)
Office Visit Note   Patient: Erica Wood           Date of Birth: 1966-02-25           MRN: [id number] Visit Date: 06/28/2020 Requested by: Shon Baton, MD [address] [place],  [place] [address] PCP: Eunice Blase, MD  Subjective: Chief Complaint  Patient presents with  . Right Knee - Pain, Follow-up    Euflexxa injections, bilateral knees  . Left Knee - Pain, Follow-up    HPI: She is here for planned Euflexxa injections for bilateral knee osteoarthritis.  Last injections were about a year and a half ago and she did well with them.  Also, she has not yet started with the Coumadin clinic.  Apparently it will be very expensive for her to go there and have regular testing.  Since she is able to get pro time free at [facility], she would like to have me manage her Coumadin.               ROS:   All other systems were reviewed and are negative.  Objective: Vital Signs: There were no vitals taken for this visit.  Physical Exam:  General:  Alert and oriented, in no acute distress. Pulm:  Breathing unlabored. Psy:  Normal mood, congruent affect.  Knees:  No effusions today.  Imaging: No results found.  Assessment & Plan: 1.  Bilateral knee DJD -Euflexxa given today in both knees.  Follow-up in a week for the second of 3 injections.  2.  Anticardiolipin syndrome -She will start Coumadin 5 mg daily.  Continue taking Xarelto for 1 week.  Recheck INR on Monday and we will adjust if needed.     Procedures: Bilateral knee Euflexxa injections:  After sterile prep with betadine, injected 3 cc 1% lidocaine without epi and Euflexxa from lateral mid-patellar approach.    PMFS History: Patient Active Problem List   Diagnosis Date Noted  . Primary osteoarthritis of both knees 05/02/2017  . Raynaud's disease without gangrene 11/26/2016  . 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 Anticardiolipin antibody positive 11/26/2016  . Autoimmune disease ([place]) 11/25/2016  . High risk medication  use 11/25/2016  . Primary osteoarthritis of both hands 11/25/2016  . History of DVT (deep vein thrombosis) 11/25/2016  . History of pulmonary embolism 11/25/2016  . Primary insomnia 11/25/2016  . Anxiety 11/25/2016  . Irritable bowel syndrome with constipation 11/25/2016  . Varicose veins of lower extremities with other complications 92/09/9416  . Spider veins of both lower extremities 05/24/2014   Past Medical History:  Diagnosis Date  . Anti-cardiolipin antibody positive   . Anti-cardiolipin antibody syndrome (HCC)   . Anxiety   . Arthralgia   . Atypical chest pain   . Autoimmune disease ([place])   . Chronic fatigue   . DVT (deep venous thrombosis) ([place])   . Pulmonary embolus ([place])   . Raynaud's syndrome   . Sicca ([place])   . Thrombocytopenia ([place])     Family History  Problem Relation Age of Onset  . Rheum arthritis Father   . Hemachromatosis Father   . Cancer Sister        bone     Past Surgical History:  Procedure Laterality Date  . ABLATION  12/2018  . ABLATION Right 08/2019  . APPENDECTOMY    . COLON SURGERY     Perforated colon repair after colostomy  . GANGLION CYST EXCISION    . HYSTEROSCOPY WITH D & C N/A 01/13/2014  Procedure: DILATATION AND CURETTAGE /HYSTEROSCOPY with resection ;  Surgeon: Olga Millers, MD;  Location: [facility];  Service: Gynecology;  Laterality: N/A;  . LAPAROTOMY     Social History   Occupational History  . Not on file  Tobacco Use  . Smoking status: Former Smoker    Packs/day: 0.50    Years: 5.00    Pack years: 2.50    Types: Cigarettes    Start date: 08/06/2006    Quit date: 08/07/2011    Years since quitting: 8.8  . Smokeless tobacco: Never Used  Vaping Use  . Vaping Use: Never used  Substance and Sexual Activity  . Alcohol use: Yes    Comment: occasionally  . Drug use: No  . Sexual activity: Not on file

## 2020-06-30 ENCOUNTER — Other Ambulatory Visit: Payer: Self-pay | Admitting: Physician Assistant

## 2020-06-30 NOTE — Telephone Encounter (Signed)
Last Visit: 02/24/2020 Next Visit: 07/27/2020 Labs: 03/17/2020 CBC/CMP WNL Eye exam: 02/2020    Current Dose per office note 02/24/2020: PLQ 200 mg BID M-F. DX: Autoimmune disease   Okay to refill per Dr. Estanislado Pandy

## 2020-07-04 ENCOUNTER — Telehealth: Payer: Self-pay | Admitting: Family Medicine

## 2020-07-04 ENCOUNTER — Other Ambulatory Visit: Payer: Self-pay | Admitting: General Surgery

## 2020-07-04 ENCOUNTER — Other Ambulatory Visit: Payer: Self-pay

## 2020-07-04 DIAGNOSIS — R760 Raised antibody titer: Secondary | ICD-10-CM

## 2020-07-04 DIAGNOSIS — K432 Incisional hernia without obstruction or gangrene: Secondary | ICD-10-CM

## 2020-07-04 NOTE — Telephone Encounter (Signed)
Spoke with patient. Order printed off and faxed. If [facility] needs something more or different for standing orders, the patient will let us know at her appointment tomorrow.

## 2020-07-04 NOTE — Telephone Encounter (Signed)
Patient called advised the information was not received by [facility] in reference to her (bloodwork) Coumadin to be checked to see how thin or thick her blood is. Patient asked if it can be faxed over. The fax# is [phone]  The number to contact patient is [phone]

## 2020-07-05 ENCOUNTER — Ambulatory Visit: Payer: 59 | Admitting: Family Medicine

## 2020-07-05 ENCOUNTER — Encounter: Payer: Self-pay | Admitting: Family Medicine

## 2020-07-05 ENCOUNTER — Other Ambulatory Visit: Payer: Self-pay

## 2020-07-05 DIAGNOSIS — M1712 Unilateral primary osteoarthritis, left knee: Secondary | ICD-10-CM

## 2020-07-05 DIAGNOSIS — M1711 Unilateral primary osteoarthritis, right knee: Secondary | ICD-10-CM | POA: Diagnosis not present

## 2020-07-05 DIAGNOSIS — M170 Bilateral primary osteoarthritis of knee: Secondary | ICD-10-CM | POA: Diagnosis not present

## 2020-07-05 NOTE — Progress Notes (Signed)
Office Visit Note   Patient: Erica Wood           Date of Birth: [birth year]           MRN: [id number] Visit Date: 07/05/2020 Requested by: Eunice Blase, MD [address] [place],  [place] [address] PCP: Eunice Blase, MD  Subjective: Chief Complaint  Patient presents with  . Right Knee - Pain  . Left Knee - Pain    HPI: She is here for planned bilateral knee Euflexxa injections.  No problems after the first injections.  She has started Coumadin and had a pro time level drawn yesterday.  We do not have the results yet.              ROS:   All other systems were reviewed and are negative.  Objective: Vital Signs: There were no vitals taken for this visit.  Physical Exam:  General:  Alert and oriented, in no acute distress. Pulm:  Breathing unlabored. Psy:  Normal mood, congruent affect. Skin: No warmth or erythema Knees: No effusions today.  Good range of motion.  Imaging: No results found.  Assessment & Plan: 1.  Bilateral knee DJD -Euflexxa No. 2 today.  Follow-up in a week for the third and final injections.     Procedures: Bilateral knee injections: After sterile prep with Betadine, injected 3 cc 1% lidocaine without epinephrine and Euflexxa from lateral midpatellar approach.    PMFS History: Patient Active Problem List   Diagnosis Date Noted  . Primary osteoarthritis of both knees 05/02/2017  . Raynaud's disease without gangrene 11/26/2016  . 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 Anticardiolipin antibody positive 11/26/2016  . Autoimmune disease ([place]) 11/25/2016  . High risk medication use 11/25/2016  . Primary osteoarthritis of both hands 11/25/2016  . History of DVT (deep vein thrombosis) 11/25/2016  . History of pulmonary embolism 11/25/2016  . Primary insomnia 11/25/2016  . Anxiety 11/25/2016  . Irritable bowel syndrome with constipation 11/25/2016  . Varicose veins of lower extremities with other complications 94/85/4627  . Spider veins of both  lower extremities 05/24/2014   Past Medical History:  Diagnosis Date  . Anti-cardiolipin antibody positive   . Anti-cardiolipin antibody syndrome (HCC)   . Anxiety   . Arthralgia   . Atypical chest pain   . Autoimmune disease ([place])   . Chronic fatigue   . DVT (deep venous thrombosis) ([place])   . Pulmonary embolus ([place])   . Raynaud's syndrome   . Sicca ([place])   . Thrombocytopenia ([place])     Family History  Problem Relation Age of Onset  . Rheum arthritis Father   . Hemachromatosis Father   . Cancer Sister        bone     Past Surgical History:  Procedure Laterality Date  . ABLATION  12/2018  . ABLATION Right 08/2019  . APPENDECTOMY    . COLON SURGERY     Perforated colon repair after colostomy  . GANGLION CYST EXCISION    . HYSTEROSCOPY WITH D & C N/A 01/13/2014   Procedure: DILATATION AND CURETTAGE /HYSTEROSCOPY with resection ;  Surgeon: Olga Millers, MD;  Location: [facility];  Service: Gynecology;  Laterality: N/A;  . LAPAROTOMY     Social History   Occupational History  . Not on file  Tobacco Use  . Smoking status: Former Smoker    Packs/day: 0.50    Years: 5.00    Pack years: 2.50    Types: Cigarettes  Start date: 08/06/2006    Quit date: 08/07/2011    Years since quitting: 8.9  . Smokeless tobacco: Never Used  Vaping Use  . Vaping Use: Never used  Substance and Sexual Activity  . Alcohol use: Yes    Comment: occasionally  . Drug use: No  . Sexual activity: Not on file

## 2020-07-06 ENCOUNTER — Telehealth: Payer: Self-pay | Admitting: Family Medicine

## 2020-07-06 LAB — PROTIME-INR
INR: 4.3 — ABNORMAL HIGH (ref 0.9–1.2)
Prothrombin Time: 42.2 s — ABNORMAL HIGH (ref 9.1–12.0)

## 2020-07-06 NOTE — Telephone Encounter (Signed)
INR is too high at 4.2.   Let's stop the Xarelto now.  Also, hold the coumadin dose for one day and then resume taking 5 mg daily, but also add a low dose of vitamin K (50 mcg daily).  I believe this will help stabilize the INR.  ([email or web address]  Recheck INR on Friday or Monday.

## 2020-07-07 ENCOUNTER — Telehealth: Payer: Self-pay | Admitting: Rheumatology

## 2020-07-07 ENCOUNTER — Other Ambulatory Visit (INDEPENDENT_AMBULATORY_CARE_PROVIDER_SITE_OTHER): Payer: Self-pay | Admitting: Family Medicine

## 2020-07-07 MED ORDER — AMPHETAMINE-DEXTROAMPHET ER 20 MG PO CP24: 20.0000 mg | ORAL_CAPSULE | Freq: Every day | ORAL | 0 refills | Status: DC

## 2020-07-07 NOTE — Telephone Encounter (Signed)
Patient called requesting prescription refill of Adderall and Hydroxychloroquine to [facility] at [address].

## 2020-07-07 NOTE — Telephone Encounter (Signed)
Last Visit: 02/24/2020 Next Visit: 07/27/2020  PLQ prescription was sent to the pharmacy on 06/30/2020.  Okay to refill Adderall?

## 2020-07-08 LAB — PROTIME-INR
INR: 2.5 — ABNORMAL HIGH (ref 0.9–1.2)
Prothrombin Time: 25.6 s — ABNORMAL HIGH (ref 9.1–12.0)

## 2020-07-10 ENCOUNTER — Telehealth: Payer: Self-pay | Admitting: Family Medicine

## 2020-07-10 ENCOUNTER — Telehealth: Payer: Self-pay | Admitting: Rheumatology

## 2020-07-10 NOTE — Telephone Encounter (Signed)
INR good at 2.5.  Recheck today or tomorrow.

## 2020-07-10 NOTE — Telephone Encounter (Signed)
Patient called stating she went to [facility] to pick up her prescription refills and was able to pick up the Adderall, but they didn't have the Plaquenil. I checked the past note and saw that [facility]  [facility] states [facility].

## 2020-07-10 NOTE — Telephone Encounter (Signed)
[facility] advised [facility]ill request received from. [facility] as [facility] no longer [facility].Patient had it transferred to [facility].

## 2020-07-11 ENCOUNTER — Other Ambulatory Visit: Payer: Self-pay | Admitting: General Surgery

## 2020-07-11 DIAGNOSIS — N6310 Unspecified lump in the right breast, unspecified quadrant: Secondary | ICD-10-CM

## 2020-07-12 ENCOUNTER — Ambulatory Visit: Payer: 59 | Admitting: Family Medicine

## 2020-07-12 ENCOUNTER — Other Ambulatory Visit: Payer: Self-pay

## 2020-07-12 ENCOUNTER — Encounter: Payer: Self-pay | Admitting: Family Medicine

## 2020-07-12 ENCOUNTER — Other Ambulatory Visit (INDEPENDENT_AMBULATORY_CARE_PROVIDER_SITE_OTHER): Payer: Self-pay | Admitting: Family Medicine

## 2020-07-12 DIAGNOSIS — M1712 Unilateral primary osteoarthritis, left knee: Secondary | ICD-10-CM

## 2020-07-12 DIAGNOSIS — C801 Malignant (primary) neoplasm, unspecified: Secondary | ICD-10-CM

## 2020-07-12 DIAGNOSIS — I2699 Other pulmonary embolism without acute cor pulmonale: Secondary | ICD-10-CM | POA: Insufficient documentation

## 2020-07-12 DIAGNOSIS — R87619 Unspecified abnormal cytological findings in specimens from cervix uteri: Secondary | ICD-10-CM | POA: Insufficient documentation

## 2020-07-12 DIAGNOSIS — M1711 Unilateral primary osteoarthritis, right knee: Secondary | ICD-10-CM

## 2020-07-12 DIAGNOSIS — R760 Raised antibody titer: Secondary | ICD-10-CM

## 2020-07-12 HISTORY — DX: Malignant (primary) neoplasm, unspecified: C80.1

## 2020-07-12 NOTE — Progress Notes (Signed)
Office Visit Note   Patient: Erica Wood           Date of Birth: [birth year]           MRN: [id number] Visit Date: 07/12/2020 Requested by: Eunice Blase, MD [address] [place],  [place] [address] PCP: Eunice Blase, MD  Subjective: Chief Complaint  Patient presents with  . Right Knee - Pain, Follow-up    3rd bilateral Euflexxa injections  . Left Knee - Pain, Follow-up    HPI: She is here for Euflexxa No. 3 for bilateral knee DJD.  Not a lot of improvement so far.  From a Coumadin standpoint she is taking 5 mg daily.  Most recent INR was 2.5, 5 days ago.  She had another one drawn this morning and the results are pending.              ROS:   All other systems were reviewed and are negative.  Objective: Vital Signs: There were no vitals taken for this visit.  Physical Exam:  General:  Alert and oriented, in no acute distress. Pulm:  Breathing unlabored. Psy:  Normal mood, congruent affect.  Knees: No significant effusion in either.  Imaging: No results found.  Assessment & Plan: 1.  Bilateral knee DJD -Euflexxa No. 3 given today.  Follow-up as needed.     Procedures: Bilateral knee injections: After sterile prep with Betadine, injected 3 cc 1% lidocaine without epinephrine and Euflexxa from lateral midpatellar approach.    PMFS History: Patient Active Problem List   Diagnosis Date Noted  . Abnormal cervical Papanicolaou smear 07/12/2020  . Pulmonary embolism ([place]) 07/12/2020  . Fibrocystic disease of breast 01/22/2018  . Primary osteoarthritis of both knees 05/02/2017  . Raynaud's disease without gangrene 11/26/2016  . 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 Anticardiolipin antibody positive 11/26/2016  . Autoimmune disease ([place]) 11/25/2016  . High risk medication use 11/25/2016  . Primary osteoarthritis of both hands 11/25/2016  . History of DVT (deep vein thrombosis) 11/25/2016  . History of pulmonary embolism 11/25/2016  . Primary insomnia 11/25/2016  .  Anxiety 11/25/2016  . Irritable bowel syndrome with constipation 11/25/2016  . Raynaud's phenomenon 01/03/2016  . Varicose veins of lower extremities with other complications 32/10/2481  . Spider veins of both lower extremities 05/24/2014   Past Medical History:  Diagnosis Date  . Anti-cardiolipin antibody positive   . Anti-cardiolipin antibody syndrome (HCC)   . Anxiety   . Arthralgia   . Atypical chest pain   . Autoimmune disease ([place])   . Chronic fatigue   . DVT (deep venous thrombosis) ([place])   . Pulmonary embolus ([place])   . Raynaud's syndrome   . Sicca ([place])   . Thrombocytopenia ([place])     Family History  Problem Relation Age of Onset  . Rheum arthritis Father   . Hemachromatosis Father   . Cancer Sister        bone     Past Surgical History:  Procedure Laterality Date  . ABLATION  12/2018  . ABLATION Right 08/2019  . APPENDECTOMY    . COLON SURGERY     Perforated colon repair after colostomy  . GANGLION CYST EXCISION    . HYSTEROSCOPY WITH D & C N/A 01/13/2014   Procedure: DILATATION AND CURETTAGE /HYSTEROSCOPY with resection ;  Surgeon: Olga Millers, MD;  Location: [facility];  Service: Gynecology;  Laterality: N/A;  . LAPAROTOMY     Social History   Occupational History  .  Not on file  Tobacco Use  . Smoking status: Former Smoker    Packs/day: 0.50    Years: 5.00    Pack years: 2.50    Types: Cigarettes    Start date: 08/06/2006    Quit date: 08/07/2011    Years since quitting: 8.9  . Smokeless tobacco: Never Used  Vaping Use  . Vaping Use: Never used  Substance and Sexual Activity  . Alcohol use: Yes    Comment: occasionally  . Drug use: No  . Sexual activity: Not on file

## 2020-07-13 ENCOUNTER — Telehealth: Payer: Self-pay | Admitting: Family Medicine

## 2020-07-13 LAB — PROTIME-INR
INR: 2.5 — ABNORMAL HIGH (ref 0.9–1.2)
Prothrombin Time: 25.4 s — ABNORMAL HIGH (ref 9.1–12.0)

## 2020-07-13 NOTE — Telephone Encounter (Signed)
INR looks good at 2.5.  Recheck in 1 week.  If still good, then 2 week interval.

## 2020-07-14 ENCOUNTER — Other Ambulatory Visit: Payer: Self-pay

## 2020-07-14 ENCOUNTER — Ambulatory Visit
Admission: RE | Admit: 2020-07-14 | Discharge: 2020-07-14 | Disposition: A | Discharge status: Home / Self Care | Payer: 59 | Source: Ambulatory Visit | Attending: General Surgery | Admitting: General Surgery

## 2020-07-14 DIAGNOSIS — K432 Incisional hernia without obstruction or gangrene: Secondary | ICD-10-CM

## 2020-07-14 IMAGING — CT CT ABD-PELV W/ CM
1 of 3 series · 14 of 32 positions shown, 19 images · IV contrast (APPLIED)
Comparison: None.

CLINICAL DATA: Right lower quadrant pain.  Prior appendectomy.

EXAM:
CT ABDOMEN AND PELVIS WITH CONTRAST
TECHNIQUE: Multidetector CT imaging of the abdomen and pelvis was performed
using the standard protocol following bolus administration of
intravenous contrast.
CONTRAST:  100mL [8Q] IOPAMIDOL ([8Q]) INJECTION 61%

[Series 2: abd/pelvis w/cm · axial · 0.71mm/px · z∈[-434,-24]mm · 14 of 94 slices shown, 19 images]
[im 6/94  soft-tissue]
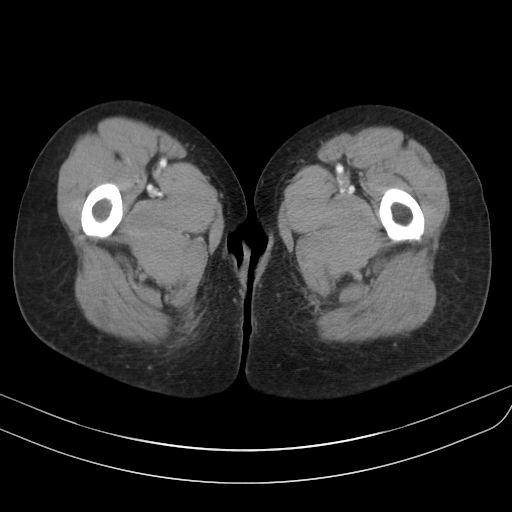
[im 6/94  bone]
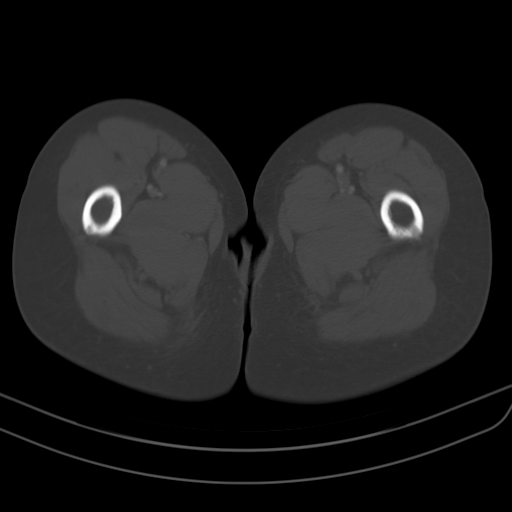
[im 11/94  soft-tissue]
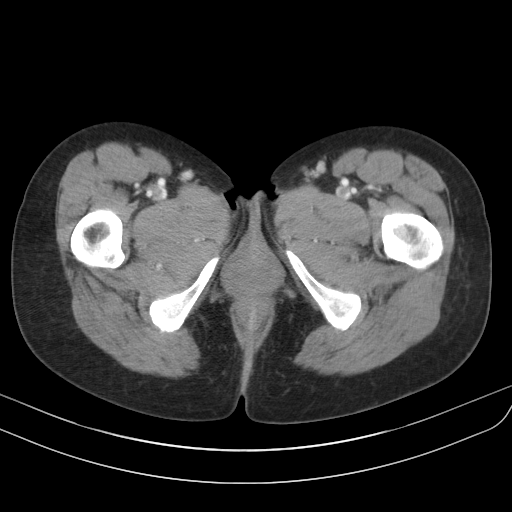
[im 22/94  soft-tissue]
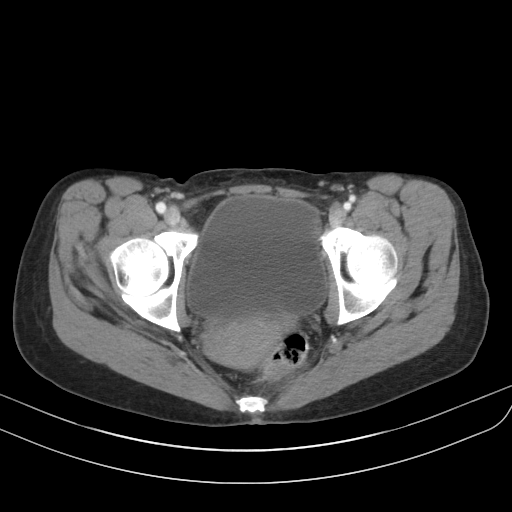
[im 28/94  soft-tissue]
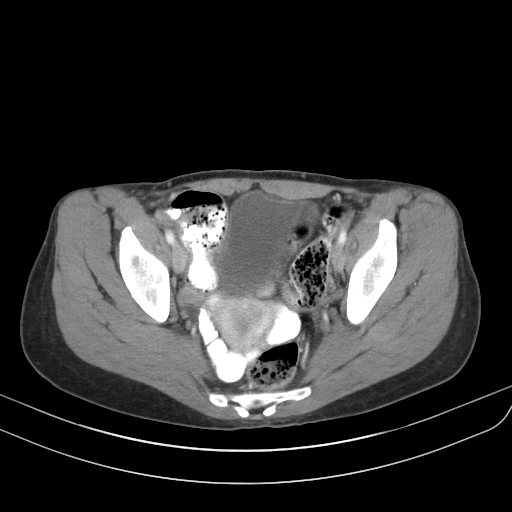
[im 33/94  soft-tissue]
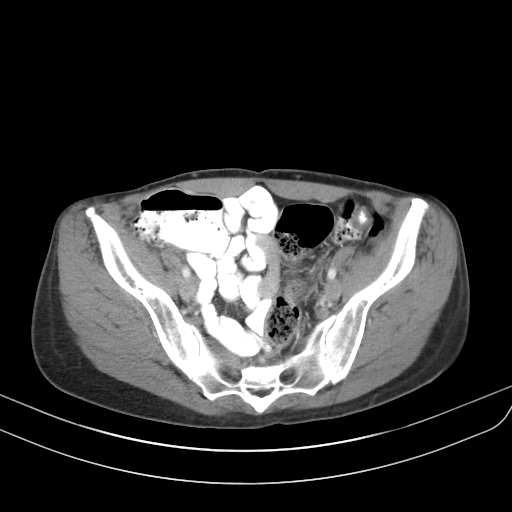
[im 39/94  soft-tissue]
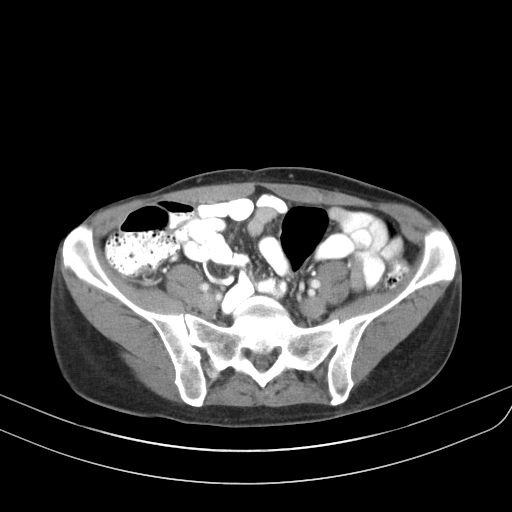
[im 50/94  soft-tissue]
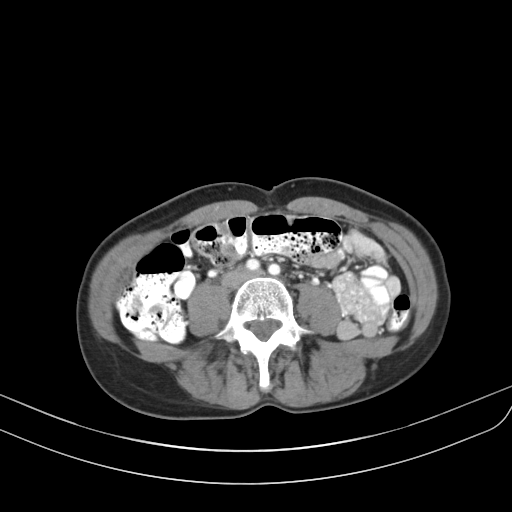
[im 55/94  soft-tissue]
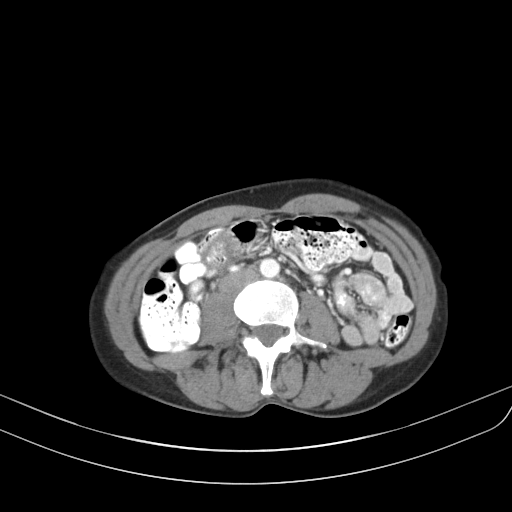
[im 61/94  soft-tissue]
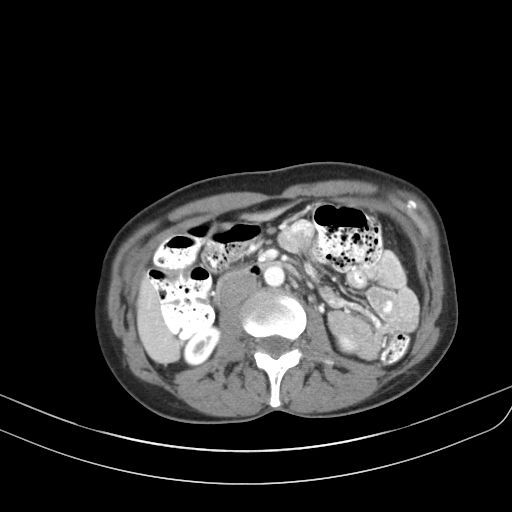
[im 61/94  bone]
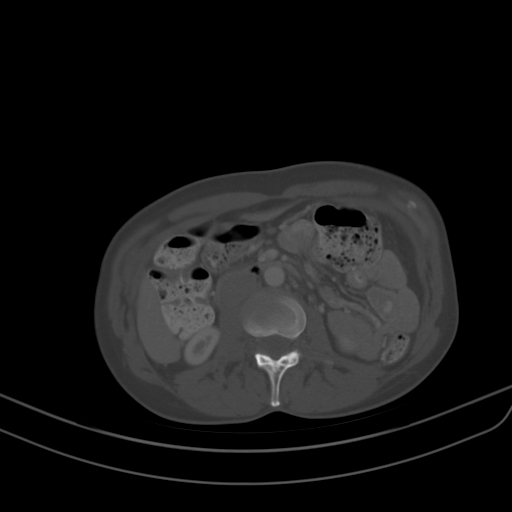
[im 66/94  soft-tissue]
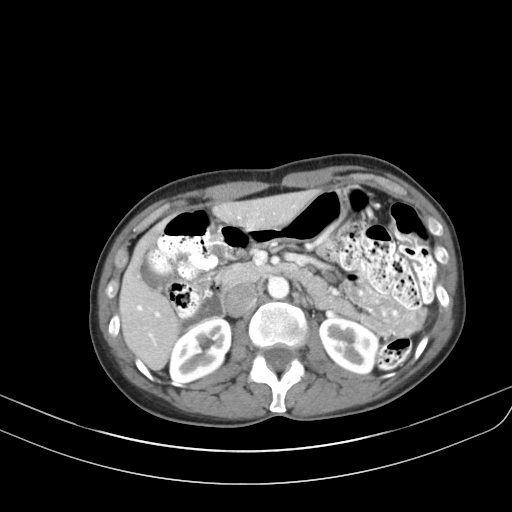
[im 72/94  soft-tissue]
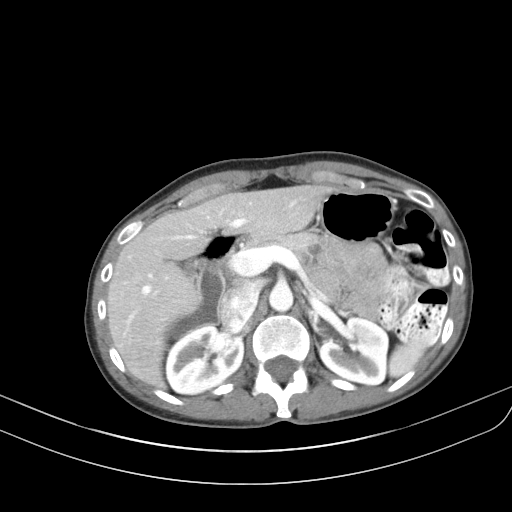
[im 72/94  lung]
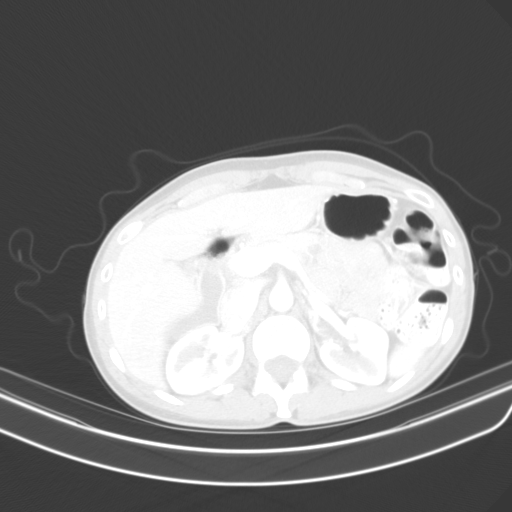
[im 77/94  lung]
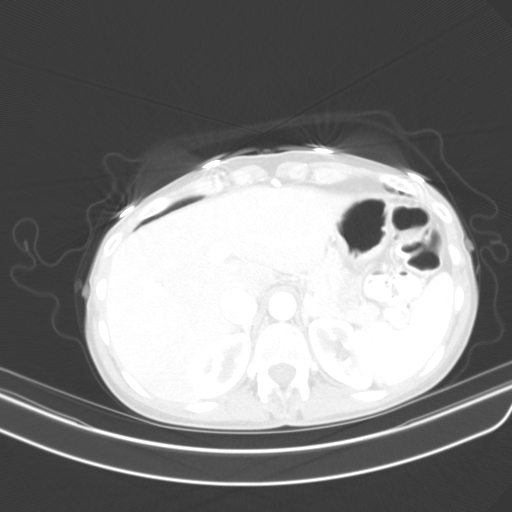
[im 83/94  soft-tissue]
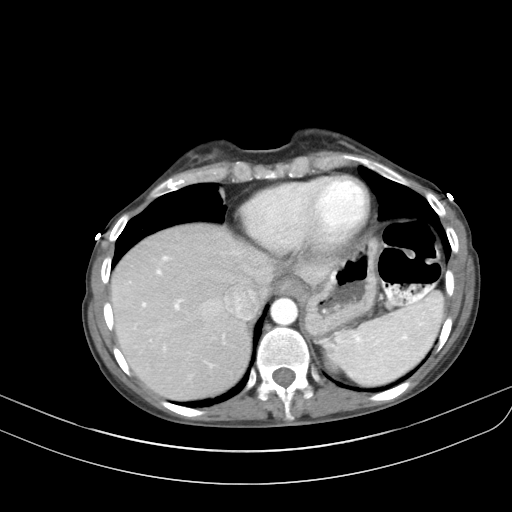
[im 83/94  lung]
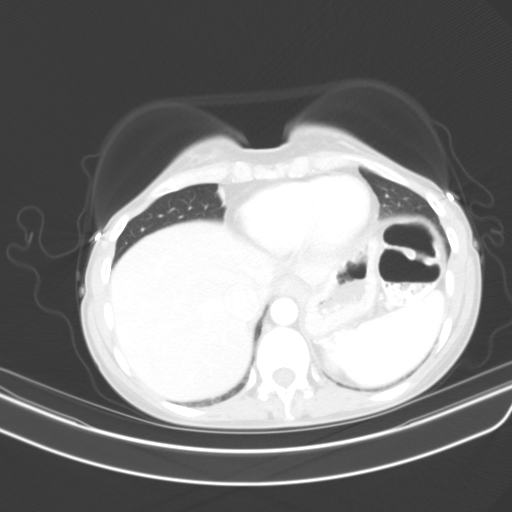
[im 88/94  soft-tissue]
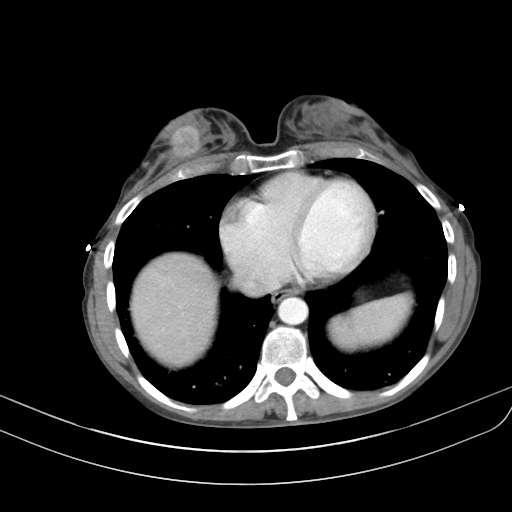
[im 88/94  lung]
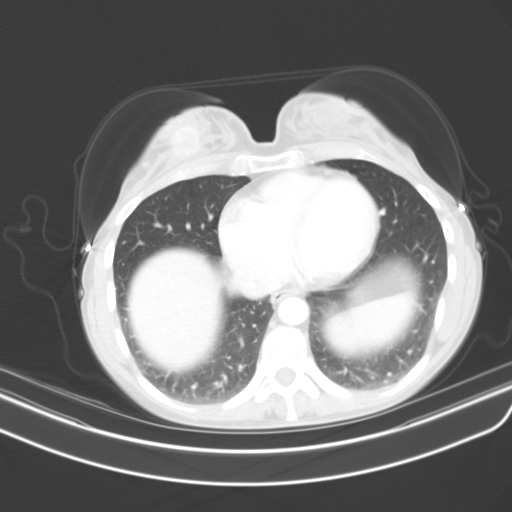

[14 of 32 positions shown; findings below may reference images not displayed]

FINDINGS: Lower Chest: No acute findings.

Hepatobiliary: A poorly defined low-attenuation mass is seen in the
caudate lobe which measures 2.2 cm on image [DATE]. A smaller
ill-defined low-attenuation lesion is seen in the lateral right lobe
measuring approximately 1 cm on image [DATE]. These both have
nonspecific characteristics. Gallbladder is unremarkable. No
evidence of biliary ductal dilatation.

Pancreas:  No mass or inflammatory changes.

Spleen: Within normal limits in size and appearance.

Adrenals/Urinary Tract: No masses identified. No evidence of
ureteral calculi or hydronephrosis.

Stomach/Bowel: No evidence of obstruction, inflammatory process or
abnormal fluid collections.

Vascular/Lymphatic: No pathologically enlarged lymph nodes. No
abdominal aortic aneurysm.

Reproductive:  No mass or other significant abnormality.

Other:  None.

Musculoskeletal:  No suspicious bone lesions identified.
IMPRESSION: No acute findings.

Two indeterminate low-attenuation liver lesions, largest measuring
2.2 cm. Abdomen MRI without and with contrast is recommended for
further characterization.

## 2020-07-14 MED ORDER — IOPAMIDOL (ISOVUE-300) INJECTION 61%
100.0000 mL | Freq: Once | INTRAVENOUS | Status: AC | PRN
  Administered 2020-07-14: 100 mL via INTRAVENOUS

## 2020-07-18 NOTE — Progress Notes (Signed)
Office Visit Note  Patient: Erica Wood             Date of Birth: [birth year]           MRN: [id number]             PCP: Eunice Blase, MD Referring: Shon Baton, MD Visit Date: 07/27/2020 Occupation: @GUAROCC @  Subjective:  Pain of the Left Ankle   History of Present Illness: Concepcion Gillott is a 54 y.o. female with history of autoimmune disease.  She states she has been experiencing increased pain in her hands and her left ankle.  She has difficulty walking in the morning due to left ankle joint pain.  She notices some swelling in her hands.  She also has Raynaud's which is quite active.  Activities of Daily Living:  Patient reports morning stiffness for 15-20  minutes.   Patient Reports nocturnal pain.  Difficulty dressing/grooming: Denies Difficulty climbing stairs: Reports Difficulty getting out of chair: Reports Difficulty using hands for taps, buttons, cutlery, and/or writing: Reports  Review of Systems  Constitutional: Positive for fatigue.  HENT: Negative for mouth sores, mouth dryness and nose dryness.        Nose sores  Eyes: Positive for itching. Negative for pain, visual disturbance and dryness.  Respiratory: Negative for shortness of breath and difficulty breathing.   Cardiovascular: Positive for swelling in legs/feet. Negative for palpitations.  Gastrointestinal: Positive for constipation. Negative for blood in stool and diarrhea.  Endocrine: Negative for increased urination.  Genitourinary: Negative for difficulty urinating and painful urination.  Musculoskeletal: Positive for arthralgias, joint pain, joint swelling and morning stiffness. Negative for myalgias, muscle weakness, muscle tenderness and myalgias.  Skin: Positive for color change. Negative for rash and redness.  Allergic/Immunologic: Negative for susceptible to infections.  Neurological: Negative for dizziness, numbness, headaches and memory loss.  Hematological: Positive for  bruising/bleeding tendency.  Psychiatric/Behavioral: Positive for sleep disturbance. The patient is nervous/anxious.     PMFS History:  Patient Active Problem List   Diagnosis Date Noted  . Abnormal cervical Papanicolaou smear 07/12/2020  . Pulmonary embolism ([place]) 07/12/2020  . Fibrocystic disease of breast 01/22/2018  . Primary osteoarthritis of both knees 05/02/2017  . Raynaud's disease without gangrene 11/26/2016  . 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 Anticardiolipin antibody positive 11/26/2016  . Autoimmune disease ([place]) 11/25/2016  . High risk medication use 11/25/2016  . Primary osteoarthritis of both hands 11/25/2016  . History of DVT (deep vein thrombosis) 11/25/2016  . History of pulmonary embolism 11/25/2016  . Primary insomnia 11/25/2016  . Anxiety 11/25/2016  . Irritable bowel syndrome with constipation 11/25/2016  . Raynaud's phenomenon 01/03/2016  . Varicose veins of lower extremities with other complications 34/19/3790  . Spider veins of both lower extremities 05/24/2014    Past Medical History:  Diagnosis Date  . Anti-cardiolipin antibody positive   . Anti-cardiolipin antibody syndrome ([facility])   . Anxiety   . Arthralgia   . Atypical chest pain   . Autoimmune disease ([place])   . Chronic fatigue   . DVT (deep venous thrombosis) ([place])   . Pulmonary embolus ([place])   . Raynaud's syndrome   . Sicca ([place])   . Thrombocytopenia ([place])     Family History  Problem Relation Age of Onset  . Rheum arthritis Father   . Hemachromatosis Father   . Cancer Sister        bone    Past Surgical History:  Procedure Laterality Date  . ABLATION  12/2018  . ABLATION Right 08/2019  . APPENDECTOMY    . COLON SURGERY     Perforated colon repair after colostomy  . GANGLION CYST EXCISION    . HYSTEROSCOPY WITH D & C N/A 01/13/2014   Procedure: DILATATION AND CURETTAGE /HYSTEROSCOPY with resection ;  Surgeon: Olga Millers, MD;  Location: [facility];  Service: Gynecology;  Laterality: N/A;    . LAPAROTOMY     Social History   Social History Narrative  . Not on file    There is no immunization history on file for this patient.   Objective: Vital Signs: BP 120/73 (BP Location: Left Arm, Patient Position: Sitting, Cuff Size: Small)   Pulse 68   Resp 14   Ht 5\' 4"  (1.626 m)   Wt 111 lb 8 oz (50.6 kg)   BMI 19.14 kg/m    Physical Exam Vitals and nursing note reviewed.  Constitutional:      Appearance: She is well-developed.  HENT:     Head: Normocephalic and atraumatic.  Eyes:     Conjunctiva/sclera: Conjunctivae normal.  Cardiovascular:     Rate and Rhythm: Normal rate and regular rhythm.     Heart sounds: Normal heart sounds.  Pulmonary:     Effort: Pulmonary effort is normal.     Breath sounds: Normal breath sounds.  Abdominal:     General: Bowel sounds are normal.     Palpations: Abdomen is soft.  Musculoskeletal:     Cervical back: Normal range of motion.  Lymphadenopathy:     Cervical: No cervical adenopathy.  Skin:    General: Skin is warm and dry.     Capillary Refill: Capillary refill takes less than 2 seconds.  Neurological:     Mental Status: She is alert and oriented to person, place, and time.  Psychiatric:        Behavior: Behavior normal.      Musculoskeletal Exam: C-spine was in good range of motion.  Shoulder joints, elbow joints, wrist joints with good range of motion.  She has synovitis over bilateral second and third MCP joints.  Hip joints in good range of motion.  Knee joints with good range of motion.  She had warmth and tenderness on palpation of left ankle joint.  CDAI Exam: CDAI Score: -- Patient Global: --; Provider Global: -- Swollen: --; Tender: -- Joint Exam 07/27/2020   No joint exam has been documented for this visit   There is currently no information documented on the homunculus. Go to the Rheumatology activity and complete the homunculus joint exam.  Investigation: No additional findings.  Imaging: CT  ABDOMEN PELVIS W CONTRAST  Result Date: 07/14/2020 CLINICAL DATA:  Right lower quadrant pain.  Prior appendectomy. EXAM: CT ABDOMEN AND PELVIS WITH CONTRAST TECHNIQUE: Multidetector CT imaging of the abdomen and pelvis was performed using the standard protocol following bolus administration of intravenous contrast. CONTRAST:  144mL ISOVUE-300 IOPAMIDOL (ISOVUE-300) INJECTION 61% COMPARISON:  None. FINDINGS: Lower Chest: No acute findings. Hepatobiliary: A poorly defined low-attenuation mass is seen in the caudate lobe which measures 2.2 cm on image 15/2. A smaller ill-defined low-attenuation lesion is seen in the lateral right lobe measuring approximately 1 cm on image 17/2. These both have nonspecific characteristics. Gallbladder is unremarkable. No evidence of biliary ductal dilatation. Pancreas:  No mass or inflammatory changes. Spleen: Within normal limits in size and appearance. Adrenals/Urinary Tract: No masses identified. No evidence of ureteral calculi or hydronephrosis. Stomach/Bowel: No evidence of obstruction, inflammatory process or  abnormal fluid collections. Vascular/Lymphatic: No pathologically enlarged lymph nodes. No abdominal aortic aneurysm. Reproductive:  No mass or other significant abnormality. Other:  None. Musculoskeletal:  No suspicious bone lesions identified. IMPRESSION: No acute findings. Two indeterminate low-attenuation liver lesions, largest measuring 2.2 cm. Abdomen MRI without and with contrast is recommended for further characterization. Electronically Signed   By: Marlaine Hind M.D.   On: 07/14/2020 15:35   US BREAST LTD UNI RIGHT INC AXILLA  Result Date: 07/26/2020 CLINICAL DATA:  Patient presents for palpable abnormality within the right breast. EXAM: DIGITAL DIAGNOSTIC RIGHT MAMMOGRAM WITH CAD AND TOMO ULTRASOUND RIGHT BREAST COMPARISON:  Previous exam(s). ACR Breast Density Category d: The breast tissue is extremely dense, which lowers the sensitivity of mammography.  FINDINGS: Underlying the palpable marker within the inferior aspect of the right breast is an irregular partially obscured mass. Magnification CC and true lateral views of the right breast were obtained. There are loosely grouped punctate calcifications identified within the middle depth and posterior depth of the right breast. Posterior to the mass there are two more focal groups of calcifications measuring 4 mm. Mammographic images were processed with CAD. On physical exam, there is a firm palpable mass within the inferior anterior right breast. Targeted ultrasound is performed, showing a 2.6 x 2.1 x 1.6 cm irregular hypoechoic mass right breast 6 o'clock position 2 cm from nipple at the site of palpable concern. No right axillary adenopathy. IMPRESSION: 1. Suspicious mass right breast 6 o'clock position at the site of palpable concern. 2. There are two focal groups of punctate calcifications within the middle depth right breast posterior to the palpable mass. RECOMMENDATION: Ultrasound-guided core needle biopsy right breast mass 6 o'clock position. Stereotactic guided core needle biopsy of the two punctate groups of calcifications within the central right breast posterior to the palpable mass for definitive characterization and treatment planning. Given the density of the breast tissue, recommend bilateral breast MRI after biopsy procedures. I have discussed the findings and recommendations with the patient. If applicable, a reminder letter will be sent to the patient regarding the next appointment. BI-RADS CATEGORY  5: Highly suggestive of malignancy. Electronically Signed   By: Lovey Newcomer M.D.   On: 07/26/2020 14:55   MM DIAG BREAST TOMO UNI RIGHT  Result Date: 07/26/2020 CLINICAL DATA:  Patient presents for palpable abnormality within the right breast. EXAM: DIGITAL DIAGNOSTIC RIGHT MAMMOGRAM WITH CAD AND TOMO ULTRASOUND RIGHT BREAST COMPARISON:  Previous exam(s). ACR Breast Density Category d: The breast  tissue is extremely dense, which lowers the sensitivity of mammography. FINDINGS: Underlying the palpable marker within the inferior aspect of the right breast is an irregular partially obscured mass. Magnification CC and true lateral views of the right breast were obtained. There are loosely grouped punctate calcifications identified within the middle depth and posterior depth of the right breast. Posterior to the mass there are two more focal groups of calcifications measuring 4 mm. Mammographic images were processed with CAD. On physical exam, there is a firm palpable mass within the inferior anterior right breast. Targeted ultrasound is performed, showing a 2.6 x 2.1 x 1.6 cm irregular hypoechoic mass right breast 6 o'clock position 2 cm from nipple at the site of palpable concern. No right axillary adenopathy. IMPRESSION: 1. Suspicious mass right breast 6 o'clock position at the site of palpable concern. 2. There are two focal groups of punctate calcifications within the middle depth right breast posterior to the palpable mass. RECOMMENDATION: Ultrasound-guided core needle biopsy right  breast mass 6 o'clock position. Stereotactic guided core needle biopsy of the two punctate groups of calcifications within the central right breast posterior to the palpable mass for definitive characterization and treatment planning. Given the density of the breast tissue, recommend bilateral breast MRI after biopsy procedures. I have discussed the findings and recommendations with the patient. If applicable, a reminder letter will be sent to the patient regarding the next appointment. BI-RADS CATEGORY  5: Highly suggestive of malignancy. Electronically Signed   By: Lovey Newcomer M.D.   On: 07/26/2020 14:55    Recent Labs: Lab Results  Component Value Date   WBC 5.4 03/17/2020   HGB 13.6 03/17/2020   PLT 158 03/17/2020   NA 140 03/17/2020   K 4.0 03/17/2020   CL 105 03/17/2020   CO2 23 03/17/2020   GLUCOSE 89  03/17/2020   BUN 8 03/17/2020   CREATININE 0.84 03/17/2020   BILITOT 0.3 03/17/2020   ALKPHOS 68 03/17/2020   AST 20 03/17/2020   ALT 14 03/17/2020   PROT 6.3 03/17/2020   ALBUMIN 4.2 03/17/2020   CALCIUM 8.7 03/17/2020   GFRAA 92 03/17/2020    Speciality Comments: PLQ Eye Exam: 02/18/2020 WNL @ [facility] follow up in 1 year.  Procedures:  No procedures performed Allergies: Beef-derived products, Latex, and Sulfa antibiotics   Assessment / Plan:     Visit Diagnoses: Autoimmune disease ([place]) - positive ANA and positive double-stranded DNA, hypocomplementemia, inflammatory arthritis, raynauds phenomenon.  Patient had positive anticardiolipin antibody in the past.  She has been having increased pain and discomfort in her joints.  She had synovitis of her bilateral second and third MCP joints.  She also had warmth on palpation of her left ankle joint.  We had detailed discussion about use of methotrexate in the past and had discussion again today.  She is hesitant to go on methotrexate as she recalls that her father developed thrombocytopenia when he was on methotrexate.  I also discussed the option of Imuran.  Indications side effects contraindications were discussed and a handout was given.  She states she will have to do some thinking before she would like to start on Imuran.  I discussed the risk of further joint deterioration and organ damage.  At this point she wants to come for a follow-up in 5 months.  She states if she changes her mind she will come earlier.  Discoid lupus-she had no recent rash.  High risk medication use - PLQ 200 mg BID M-F. PLQ Eye Exam 02/2020 -we will get labs again through [facility].  Pain in both hands-she has synovitis over bilateral second and third MCP joints.  Plan: XR Hand 2 View Right, XR Hand 2 View Left.  X-rays show radiographic progression in the right hand with narrowing of second and third MCP joints.  Findings were discussed with the  patient.  Raynaud's disease without gangrene-she continues to have Raynaud's symptoms.  Use of warm clothing and keeping core temperature warm was discussed.  Anticardiolipin antibody positive-she had low titer antibody titers.  Primary osteoarthritis of both hands-she has some osteoarthritic changes.  Primary osteoarthritis of both knees-he had discomfort in her knees.  Left ankle pain-she had warmth on palpation.  Primary insomnia  Vitamin D deficiency  History of pulmonary embolism  History of anxiety  History of IBS  History of DVT (deep vein thrombosis)  Educated about COVID-19 virus infection-she has not received COVID-19 vaccination.  I detailed discussion with the patient regarding the risk  of not being immunized.  She is not ready to be immunized.  Family history of hemochromatosis  Orders: Orders Placed This Encounter  Procedures  . XR Hand 2 View Right  . XR Hand 2 View Left   No orders of the defined types were placed in this encounter.     Follow-Up Instructions: Return in about 5 months (around 12/27/2020) for Autoimmune disease.   Bo Merino, MD  Note - This record has been created using Editor, commissioning.  Chart creation errors have been sought, but may not always  have been located. Such creation errors do not reflect on  the standard of medical care.

## 2020-07-21 ENCOUNTER — Other Ambulatory Visit (INDEPENDENT_AMBULATORY_CARE_PROVIDER_SITE_OTHER): Payer: Self-pay | Admitting: Family Medicine

## 2020-07-22 LAB — PROTIME-INR
INR: 3.3 — ABNORMAL HIGH (ref 0.9–1.2)
Prothrombin Time: 33.3 s — ABNORMAL HIGH (ref 9.1–12.0)

## 2020-07-24 ENCOUNTER — Telehealth: Payer: Self-pay | Admitting: Family Medicine

## 2020-07-24 ENCOUNTER — Encounter: Payer: Self-pay | Admitting: Family Medicine

## 2020-07-24 NOTE — Telephone Encounter (Signed)
INR 3.3

## 2020-07-26 ENCOUNTER — Other Ambulatory Visit: Payer: Self-pay | Admitting: General Surgery

## 2020-07-26 ENCOUNTER — Ambulatory Visit
Admission: RE | Admit: 2020-07-26 | Discharge: 2020-07-26 | Disposition: A | Discharge status: Home / Self Care | Payer: 59 | Source: Ambulatory Visit | Attending: General Surgery | Admitting: General Surgery

## 2020-07-26 ENCOUNTER — Other Ambulatory Visit: Payer: Self-pay

## 2020-07-26 DIAGNOSIS — R921 Mammographic calcification found on diagnostic imaging of breast: Secondary | ICD-10-CM

## 2020-07-26 DIAGNOSIS — N6310 Unspecified lump in the right breast, unspecified quadrant: Secondary | ICD-10-CM

## 2020-07-26 DIAGNOSIS — R160 Hepatomegaly, not elsewhere classified: Secondary | ICD-10-CM

## 2020-07-26 IMAGING — MG MM DIGITAL DIAGNOSTIC UNILAT*R* W/ TOMO W/ CAD
8 of 14 series · 8 of 34 positions shown · non-contrast
Comparison: Previous exam(s).

CLINICAL DATA: Patient presents for palpable abnormality within the
right breast.

EXAM:
DIGITAL DIAGNOSTIC RIGHT MAMMOGRAM WITH CAD AND TOMO
ULTRASOUND RIGHT BREAST

[R CC (1 of 2)]
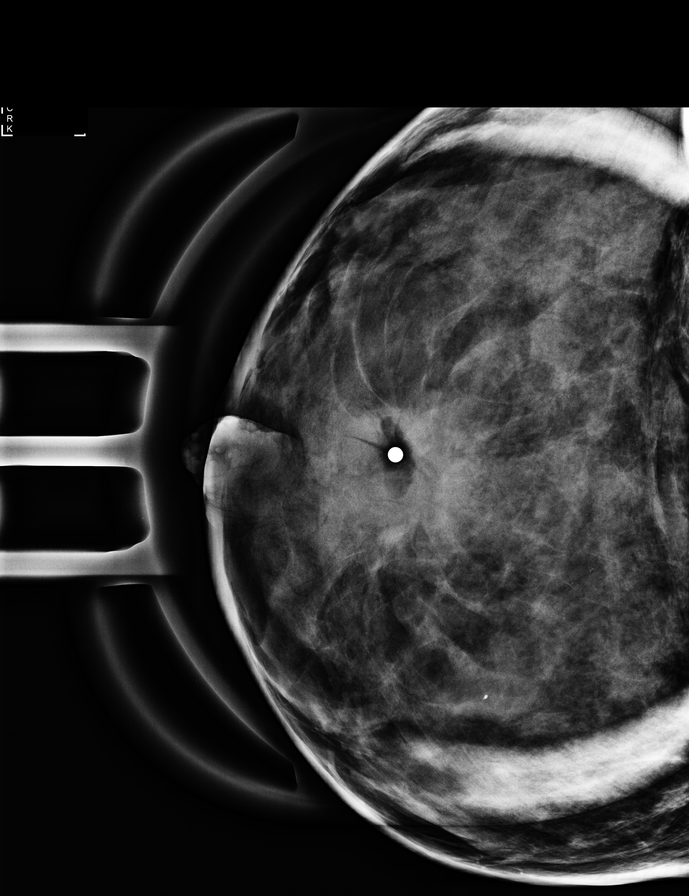

[R CC (2 of 2)]
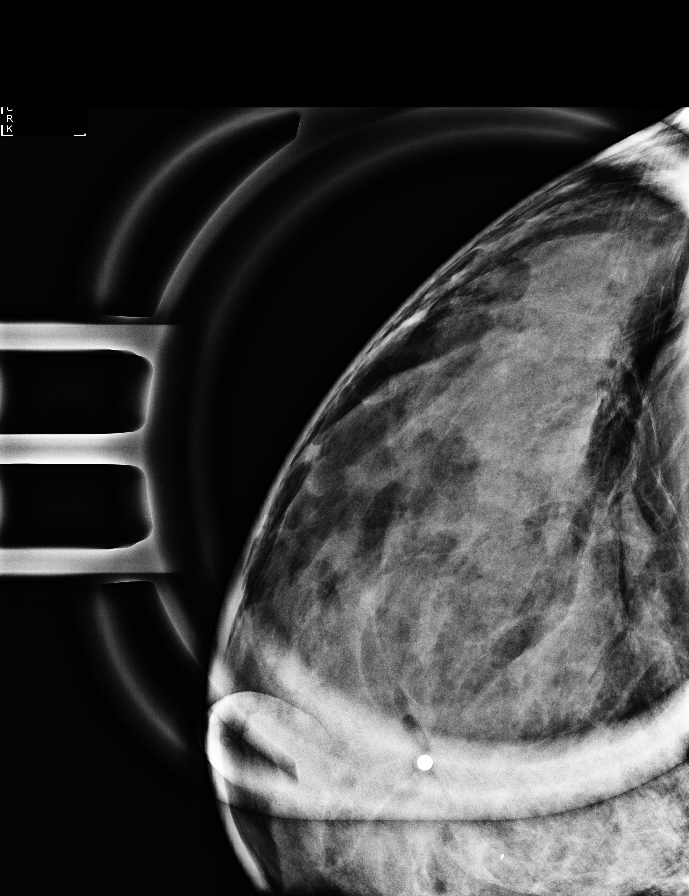

[R ML (1 of 2)]
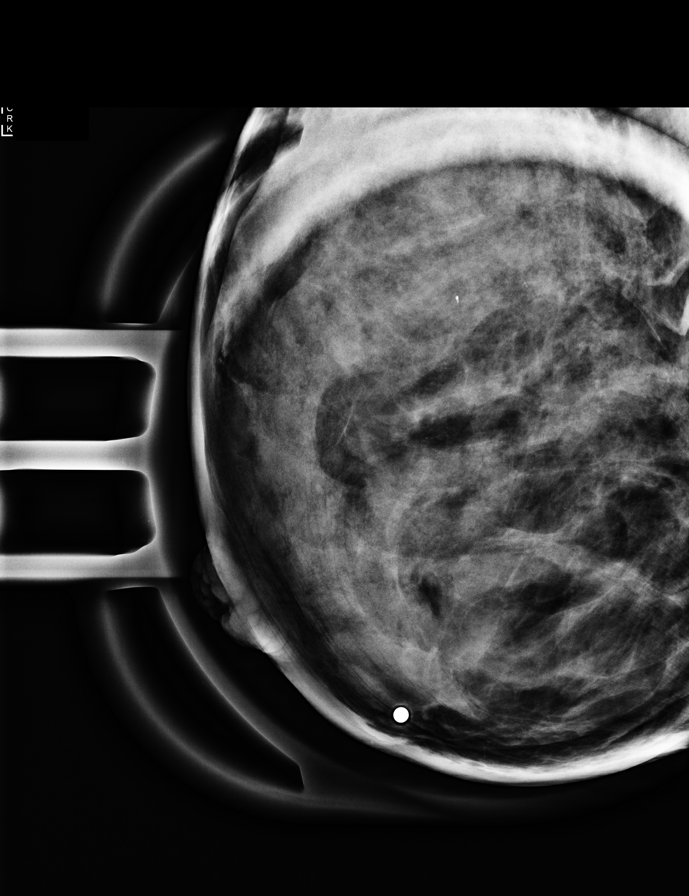

[R ML (2 of 2)]
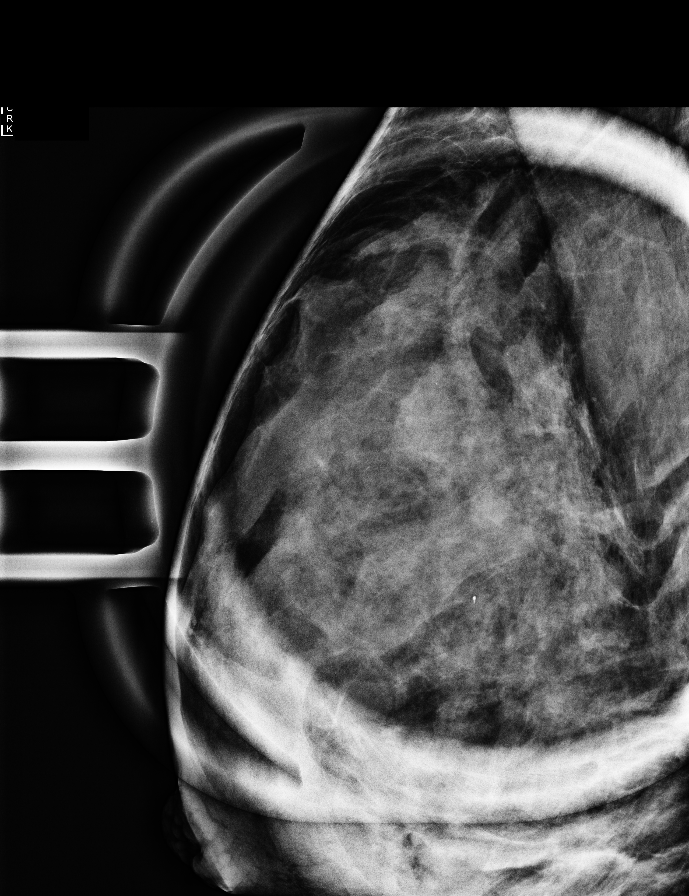

[R CC synth-2D (1 of 2)]
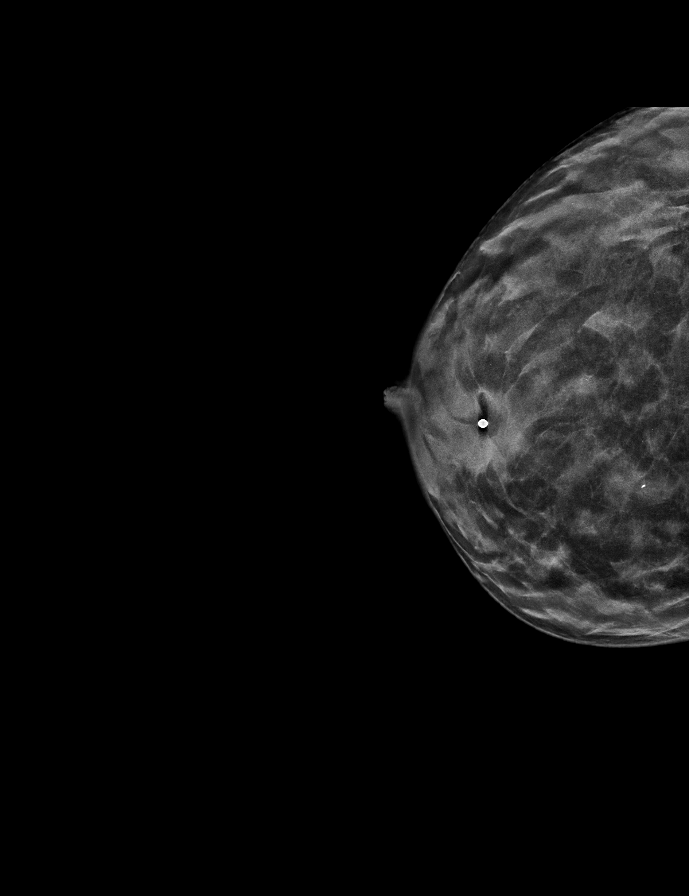

[R MLO synth-2D]
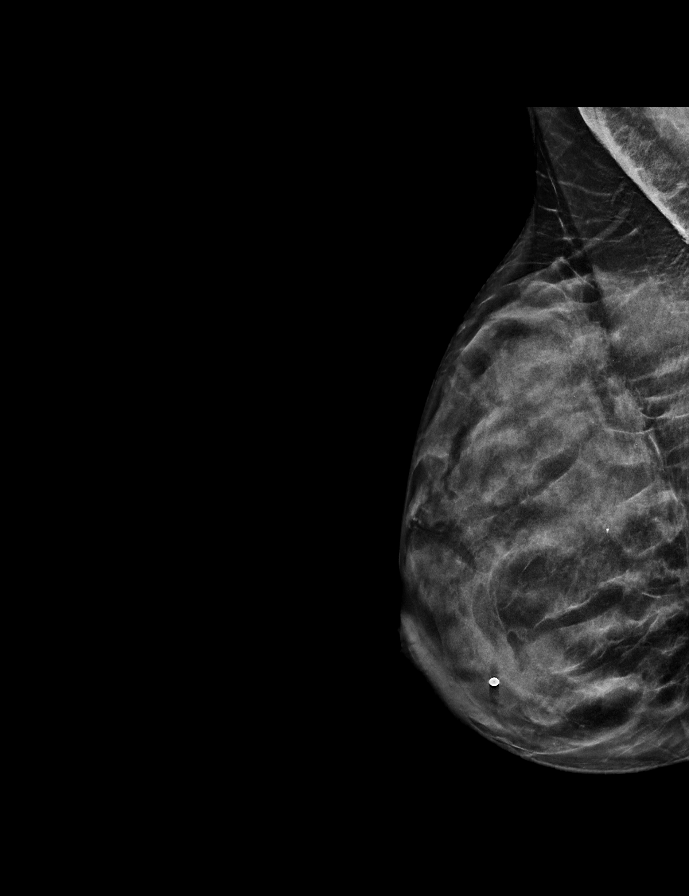

[R ML synth-2D]
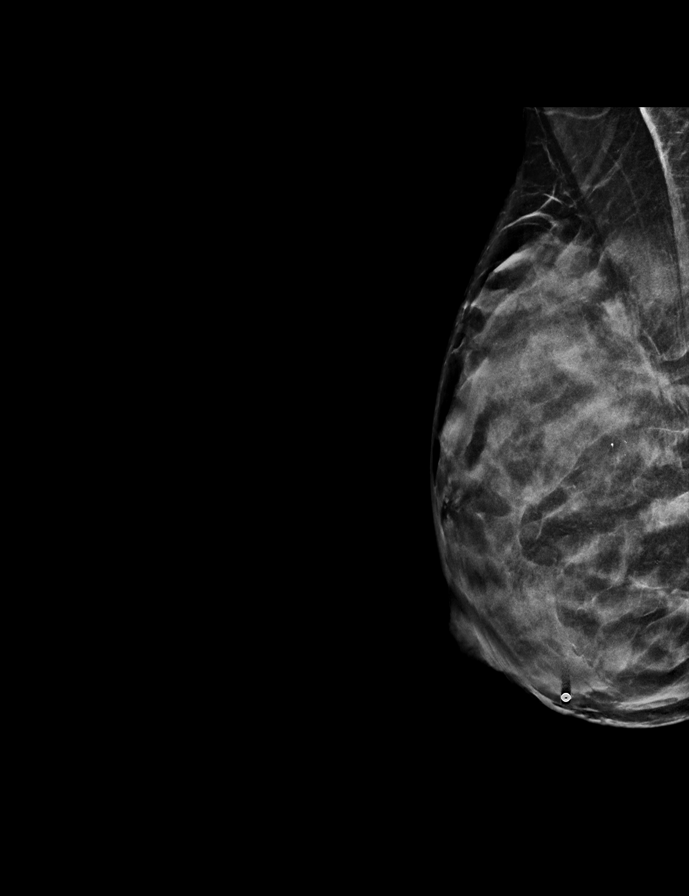

[R CC synth-2D (2 of 2)]
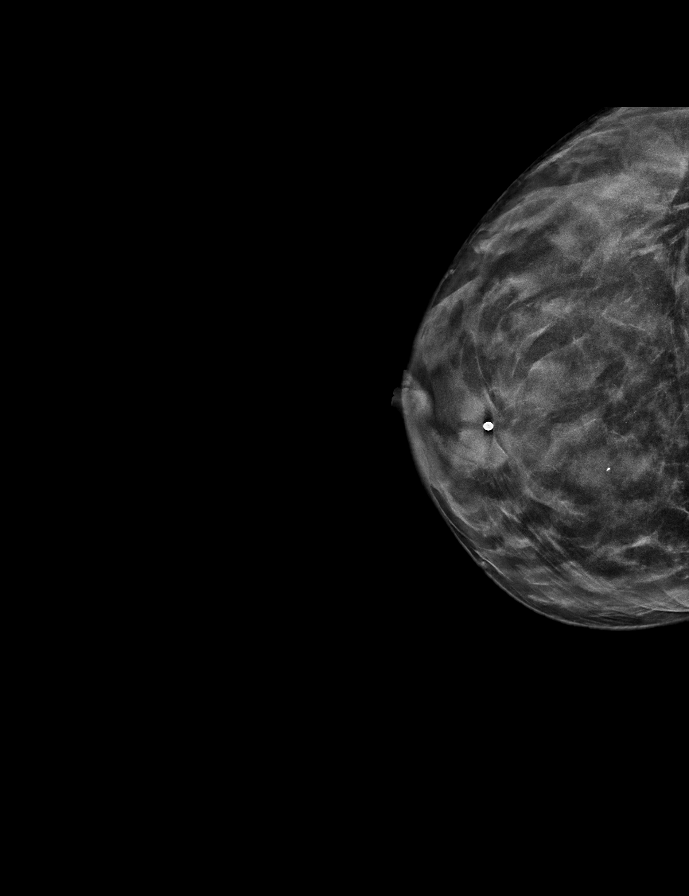

[8 of 34 positions shown; findings below may reference images not displayed]

ACR Breast Density Category d: The breast tissue is extremely dense,
which lowers the sensitivity of mammography.
FINDINGS: Underlying the palpable marker within the inferior aspect of the
right breast is an irregular partially obscured mass.

Magnification CC and true lateral views of the right breast were
obtained. There are loosely grouped punctate calcifications
identified within the middle depth and posterior depth of the right
breast. Posterior to the mass there are two more focal groups of
calcifications measuring 4 mm.

Mammographic images were processed with CAD.

On physical exam, there is a firm palpable mass within the inferior
anterior right breast.

Targeted ultrasound is performed, showing a 2.6 x 2.1 x 1.6 cm
irregular hypoechoic mass right breast 6 o'clock position 2 cm from
nipple at the site of palpable concern.

No right axillary adenopathy.
IMPRESSION: 1. Suspicious mass right breast 6 o'clock position at the site of
palpable concern.
2. There are two focal groups of punctate calcifications within the
middle depth right breast posterior to the palpable mass.

RECOMMENDATION:
Ultrasound-guided core needle biopsy right breast mass 6 o'clock
position.

Stereotactic guided core needle biopsy of the two punctate groups of
calcifications within the central right breast posterior to the
palpable mass for definitive characterization and treatment
planning.

Given the density of the breast tissue, recommend bilateral breast
MRI after biopsy procedures.

I have discussed the findings and recommendations with the patient.
If applicable, a reminder letter will be sent to the patient
regarding the next appointment.

BI-RADS CATEGORY  5: Highly suggestive of malignancy.

## 2020-07-26 IMAGING — US US BREAST*R* LIMITED INC AXILLA
1 series · 11 of 11 positions shown · non-contrast
Comparison: Previous exam(s).

CLINICAL DATA: Patient presents for palpable abnormality within the
right breast.

EXAM:
DIGITAL DIAGNOSTIC RIGHT MAMMOGRAM WITH CAD AND TOMO
ULTRASOUND RIGHT BREAST

[Series 1: us breast*right* limited inc axilla · 0.06mm/px · 11 of 11 slices shown]
[im 1/11]
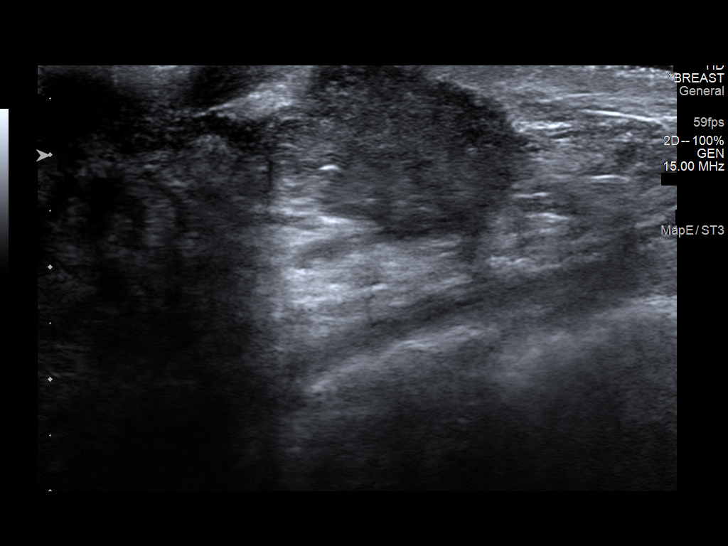
[im 2/11]
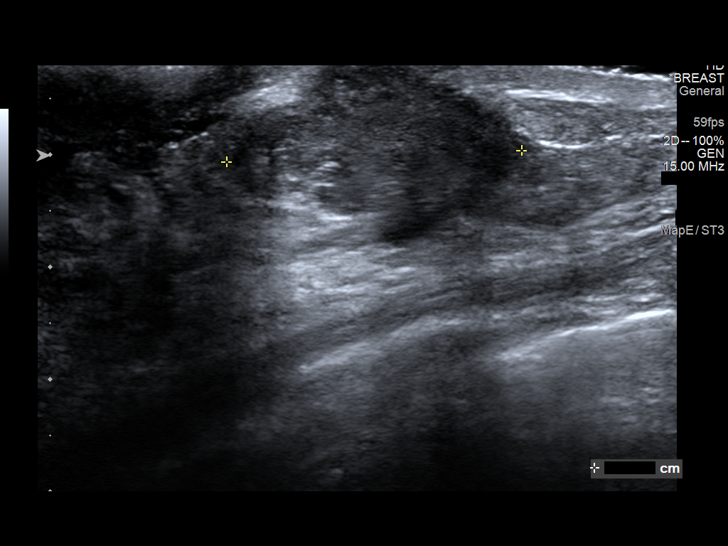
[im 3/11]
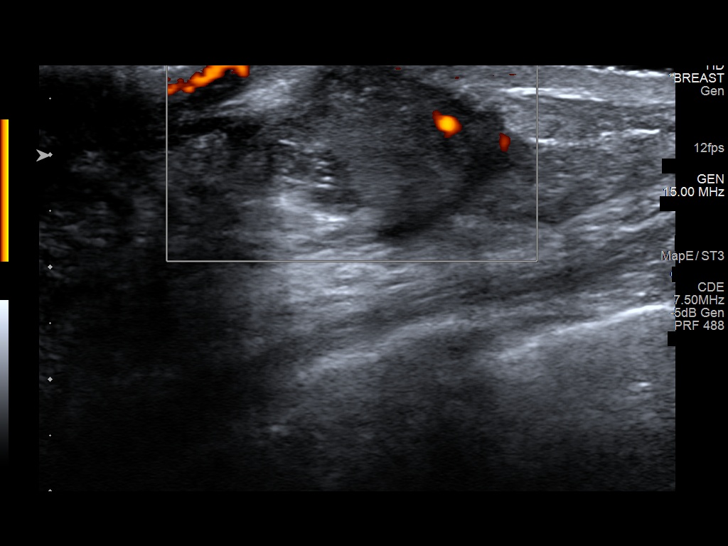
[im 4/11]
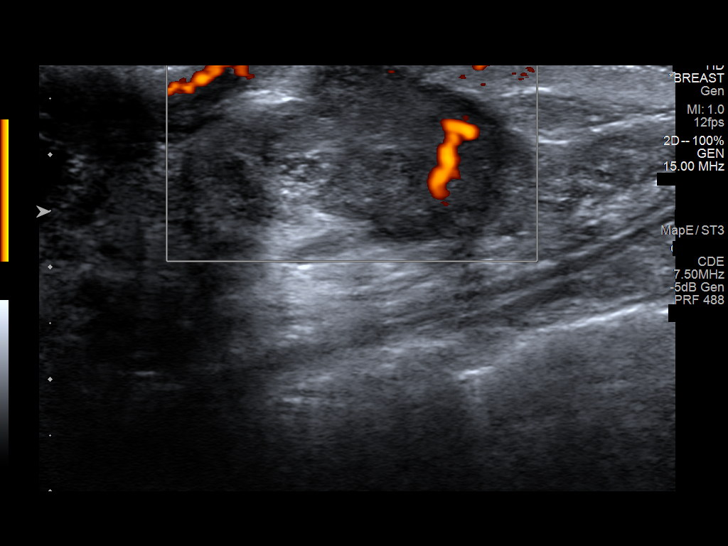
[im 5/11]
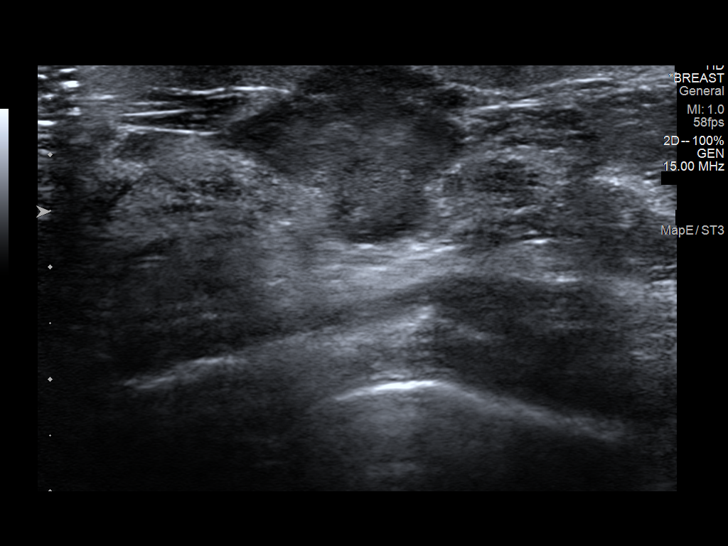
[im 6/11]
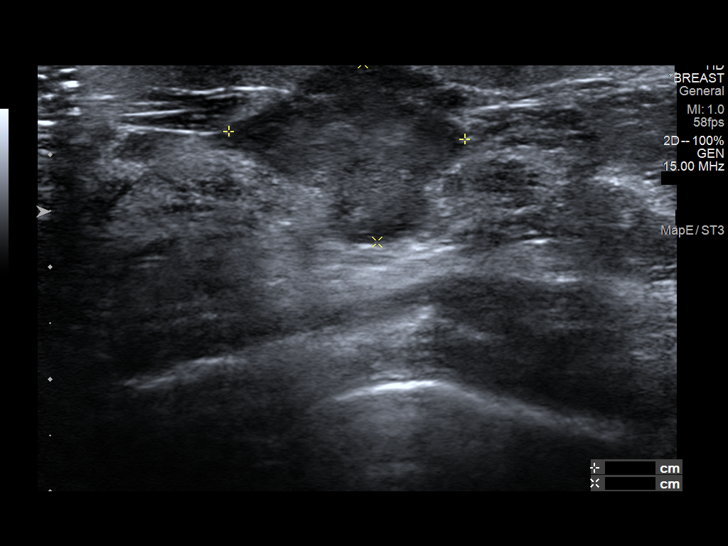
[im 7/11]
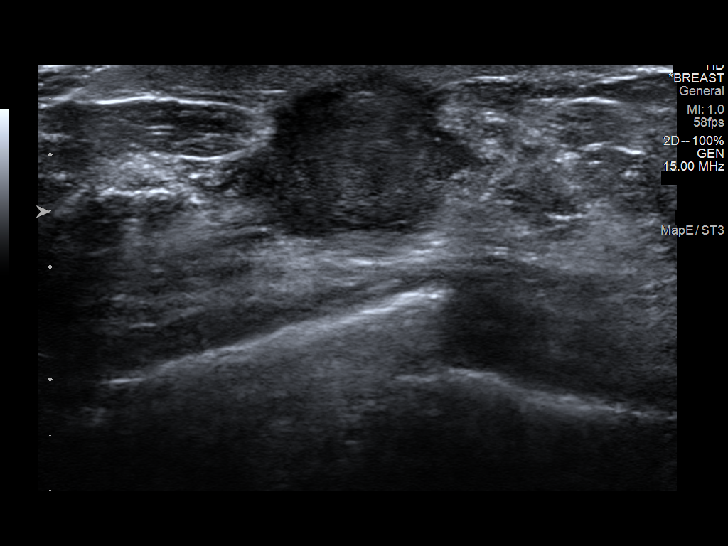
[im 8/11]
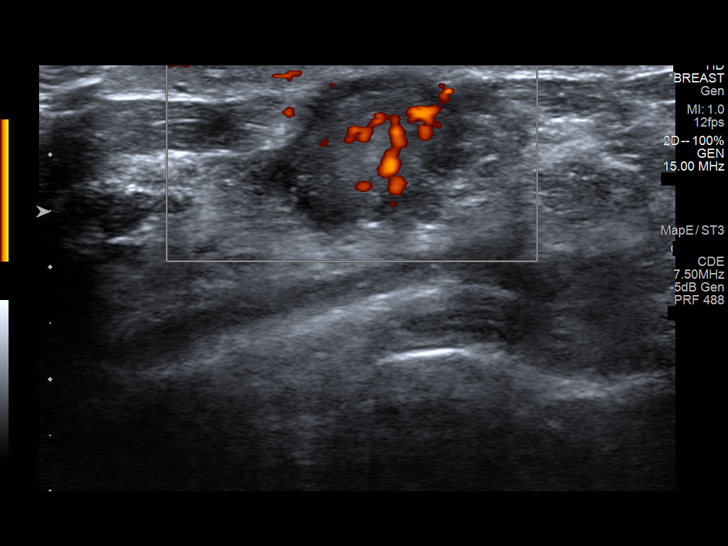
[im 9/11]
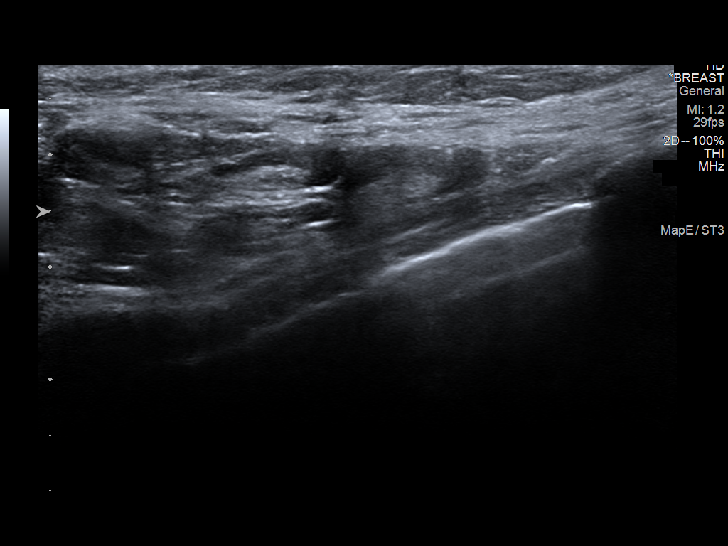
[im 10/11]
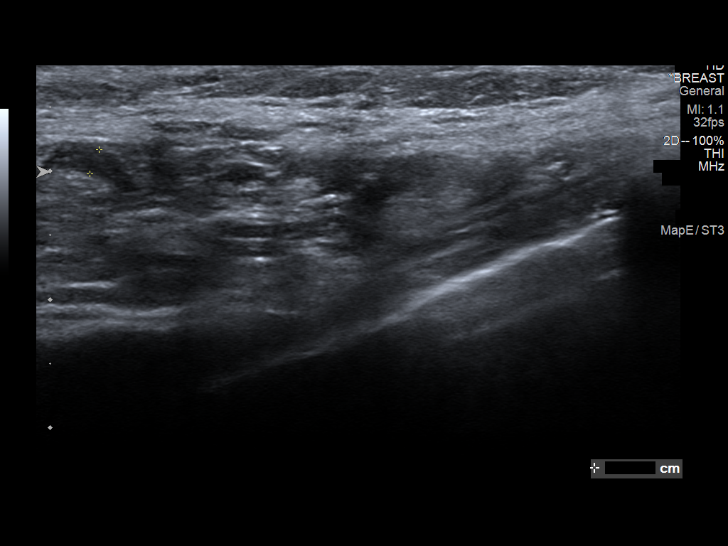
[im 11/11]
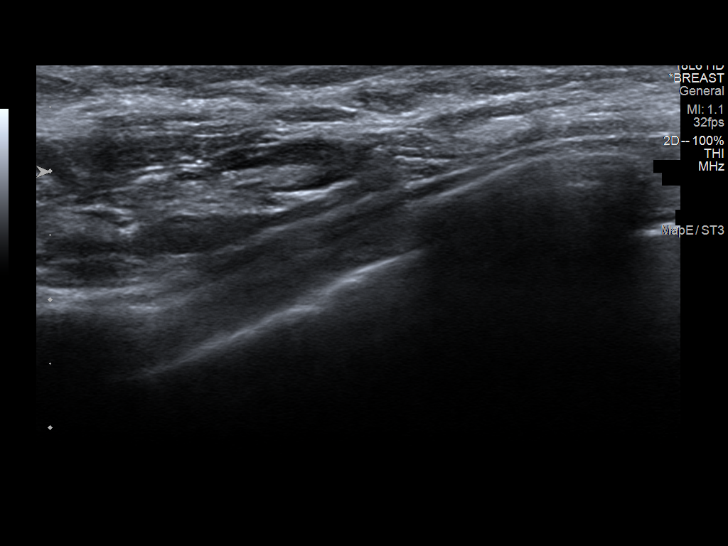

[11 of 11 positions shown; findings below may reference images not displayed]

ACR Breast Density Category d: The breast tissue is extremely dense,
which lowers the sensitivity of mammography.
FINDINGS: Underlying the palpable marker within the inferior aspect of the
right breast is an irregular partially obscured mass.

Magnification CC and true lateral views of the right breast were
obtained. There are loosely grouped punctate calcifications
identified within the middle depth and posterior depth of the right
breast. Posterior to the mass there are two more focal groups of
calcifications measuring 4 mm.

Mammographic images were processed with CAD.

On physical exam, there is a firm palpable mass within the inferior
anterior right breast.

Targeted ultrasound is performed, showing a 2.6 x 2.1 x 1.6 cm
irregular hypoechoic mass right breast 6 o'clock position 2 cm from
nipple at the site of palpable concern.

No right axillary adenopathy.
IMPRESSION: 1. Suspicious mass right breast 6 o'clock position at the site of
palpable concern.
2. There are two focal groups of punctate calcifications within the
middle depth right breast posterior to the palpable mass.

RECOMMENDATION:
Ultrasound-guided core needle biopsy right breast mass 6 o'clock
position.

Stereotactic guided core needle biopsy of the two punctate groups of
calcifications within the central right breast posterior to the
palpable mass for definitive characterization and treatment
planning.

Given the density of the breast tissue, recommend bilateral breast
MRI after biopsy procedures.

I have discussed the findings and recommendations with the patient.
If applicable, a reminder letter will be sent to the patient
regarding the next appointment.

BI-RADS CATEGORY  5: Highly suggestive of malignancy.

## 2020-07-27 ENCOUNTER — Ambulatory Visit: Payer: 59 | Admitting: Rheumatology

## 2020-07-27 ENCOUNTER — Ambulatory Visit: Payer: Self-pay

## 2020-07-27 ENCOUNTER — Encounter: Payer: Self-pay | Admitting: Rheumatology

## 2020-07-27 ENCOUNTER — Encounter: Payer: Self-pay | Admitting: Family Medicine

## 2020-07-27 ENCOUNTER — Other Ambulatory Visit: Payer: Self-pay

## 2020-07-27 VITALS — BP 120/73 | HR 68 | Resp 14 | Ht 64.0 in | Wt 111.5 lb

## 2020-07-27 DIAGNOSIS — R760 Raised antibody titer: Secondary | ICD-10-CM

## 2020-07-27 DIAGNOSIS — M25572 Pain in left ankle and joints of left foot: Secondary | ICD-10-CM

## 2020-07-27 DIAGNOSIS — Z79899 Other long term (current) drug therapy: Secondary | ICD-10-CM | POA: Diagnosis not present

## 2020-07-27 DIAGNOSIS — M359 Systemic involvement of connective tissue, unspecified: Secondary | ICD-10-CM

## 2020-07-27 DIAGNOSIS — Z86711 Personal history of pulmonary embolism: Secondary | ICD-10-CM

## 2020-07-27 DIAGNOSIS — Z8719 Personal history of other diseases of the digestive system: Secondary | ICD-10-CM

## 2020-07-27 DIAGNOSIS — E559 Vitamin D deficiency, unspecified: Secondary | ICD-10-CM

## 2020-07-27 DIAGNOSIS — M19041 Primary osteoarthritis, right hand: Secondary | ICD-10-CM | POA: Diagnosis not present

## 2020-07-27 DIAGNOSIS — Z7189 Other specified counseling: Secondary | ICD-10-CM

## 2020-07-27 DIAGNOSIS — F5101 Primary insomnia: Secondary | ICD-10-CM

## 2020-07-27 DIAGNOSIS — M79642 Pain in left hand: Secondary | ICD-10-CM

## 2020-07-27 DIAGNOSIS — Z8659 Personal history of other mental and behavioral disorders: Secondary | ICD-10-CM

## 2020-07-27 DIAGNOSIS — M1712 Unilateral primary osteoarthritis, left knee: Secondary | ICD-10-CM

## 2020-07-27 DIAGNOSIS — L930 Discoid lupus erythematosus: Secondary | ICD-10-CM

## 2020-07-27 DIAGNOSIS — M79641 Pain in right hand: Secondary | ICD-10-CM

## 2020-07-27 DIAGNOSIS — I7300 Raynaud's syndrome without gangrene: Secondary | ICD-10-CM

## 2020-07-27 DIAGNOSIS — M170 Bilateral primary osteoarthritis of knee: Secondary | ICD-10-CM

## 2020-07-27 DIAGNOSIS — M1711 Unilateral primary osteoarthritis, right knee: Secondary | ICD-10-CM

## 2020-07-27 DIAGNOSIS — M19042 Primary osteoarthritis, left hand: Secondary | ICD-10-CM

## 2020-07-27 DIAGNOSIS — Z86718 Personal history of other venous thrombosis and embolism: Secondary | ICD-10-CM

## 2020-07-27 DIAGNOSIS — Z8349 Family history of other endocrine, nutritional and metabolic diseases: Secondary | ICD-10-CM

## 2020-07-27 NOTE — Patient Instructions (Signed)
Azathioprine injection What is this medicine? AZATHIOPRINE (ay za THYE oh preen) suppresses the immune system. It is used to prevent organ rejection after a transplant. It is also used to treat rheumatoid arthritis. The injection is given only when the medicine can not be taken by mouth. This medicine may be used for other purposes; ask your health care provider or pharmacist if you have questions. COMMON BRAND NAME(S): Imuran What should I tell my health care provider before I take this medicine? They need to know if you have any of these conditions:  infection  kidney disease  liver disease  an unusual or allergic reaction to azathioprine, other medicines, lactose, foods, dyes, or preservatives  pregnant or trying to get pregnant  breast feeding How should I use this medicine? This medicine is for injection or infusion into a vein. It is given by a health care professional in a hospital or clinic setting. Talk to your pediatrician regarding the use of this medicine in children. Special care may be needed. Overdosage: If you think you have taken too much of this medicine contact a poison control center or emergency room at once. NOTE: This medicine is only for you. Do not share this medicine with others. What if I miss a dose? This does not apply. What may interact with this medicine? Do not take this medicine with any of the following medications:  febuxostat  mercaptopurine This medicine may also interact with the following medications:  allopurinol  aminosalicylates like sulfasalazine, mesalamine, balsalazide, and olsalazine  leflunomide  medicines called ACE inhibitors like benazepril, captopril, enalapril, fosinopril, quinapril, lisinopril, ramipril, and trandolapril  mycophenolate  sulfamethoxazole; trimethoprim  vaccines  warfarin This list may not describe all possible interactions. Give your health care provider a list of all the medicines, herbs,  non-prescription drugs, or dietary supplements you use. Also tell them if you smoke, drink alcohol, or use illegal drugs. Some items may interact with your medicine. What should I watch for while using this medicine? Your condition will be monitored carefully while you are receiving this medicine. This medicine may increase your risk of getting an infection. Stay away from people who are sick. See your doctor if you get an infection. Women should inform their doctor if they wish to become pregnant or think they might be pregnant. There is a potential for serious side effects to an unborn child. Talk to your health care professional or pharmacist for more information. Men may have a reduced sperm count while they are taking this medicine. Talk to your health care professional for more information. This medicine may increase your risk of getting certain kinds of cancer. Talk to your doctor about healthy lifestyle choices, important screenings, and your risk. What side effects may I notice from receiving this medicine? Side effects that you should report to your doctor or health care professional as soon as possible:  allergic reactions like skin rash, itching or hives, swelling of the face, lips, or tongue  changes in vision  confusion  fever, chills, or any other sign of infection  loss of balance or coordination  severe stomach pain  unusual bleeding, bruising  unusually weak or tired  vomiting  yellowing of the eyes or skin Side effects that usually do not require medical attention (report to your doctor or health care professional if they continue or are bothersome):  hair loss  nausea This list may not describe all possible side effects. Call your doctor for medical advice about side effects. You   may report side effects to FDA at 1-800-FDA-1088. Where should I keep my medicine? This drug is given in a hospital or clinic and will not be stored at home. NOTE: This sheet is a  summary. It may not cover all possible information. If you have questions about this medicine, talk to your doctor, pharmacist, or health care provider.  2020 Elsevier/Gold Standard (2014-02-22 12:02:06)  

## 2020-07-28 ENCOUNTER — Ambulatory Visit: Payer: 59 | Admitting: Rheumatology

## 2020-07-28 ENCOUNTER — Other Ambulatory Visit (INDEPENDENT_AMBULATORY_CARE_PROVIDER_SITE_OTHER): Payer: Self-pay | Admitting: Family Medicine

## 2020-07-29 ENCOUNTER — Encounter: Payer: Self-pay | Admitting: Family Medicine

## 2020-07-29 LAB — PROTIME-INR
INR: 2.2 — ABNORMAL HIGH (ref 0.9–1.2)
Prothrombin Time: 22.3 s — ABNORMAL HIGH (ref 9.1–12.0)

## 2020-07-30 LAB — CBC WITH DIFFERENTIAL/PLATELET
Basophils Absolute: 0.1 10*3/uL (ref 0.0–0.2)
Basos: 1 %
EOS (ABSOLUTE): 0 10*3/uL (ref 0.0–0.4)
Eos: 1 %
Hematocrit: 41.4 % (ref 34.0–46.6)
Hemoglobin: 14.3 g/dL (ref 11.1–15.9)
Immature Grans (Abs): 0 10*3/uL (ref 0.0–0.1)
Immature Granulocytes: 0 %
Lymphocytes Absolute: 1.9 10*3/uL (ref 0.7–3.1)
Lymphs: 35 %
MCH: 30.6 pg (ref 26.6–33.0)
MCHC: 34.5 g/dL (ref 31.5–35.7)
MCV: 89 fL (ref 79–97)
Monocytes Absolute: 0.5 10*3/uL (ref 0.1–0.9)
Monocytes: 8 %
Neutrophils Absolute: 3 10*3/uL (ref 1.4–7.0)
Neutrophils: 55 %
Platelets: 202 10*3/uL (ref 150–450)
RBC: 4.68 x10E6/uL (ref 3.77–5.28)
RDW: 10.6 % — ABNORMAL LOW (ref 11.7–15.4)
WBC: 5.5 10*3/uL (ref 3.4–10.8)

## 2020-07-30 LAB — URINALYSIS, ROUTINE W REFLEX MICROSCOPIC
Bilirubin, UA: NEGATIVE
Ketones, UA: NEGATIVE
Leukocytes,UA: NEGATIVE
Nitrite, UA: NEGATIVE
RBC, UA: NEGATIVE
Specific Gravity, UA: 1.026 (ref 1.005–1.030)
Urobilinogen, Ur: 0.2 mg/dL (ref 0.2–1.0)
pH, UA: 5.5 (ref 5.0–7.5)

## 2020-07-30 LAB — CMP14+EGFR
ALT: 18 IU/L (ref 0–32)
AST: 21 IU/L (ref 0–40)
Albumin/Globulin Ratio: 2 (ref 1.2–2.2)
Albumin: 4.9 g/dL (ref 3.8–4.9)
Alkaline Phosphatase: 78 IU/L (ref 44–121)
BUN/Creatinine Ratio: 10 (ref 9–23)
BUN: 8 mg/dL (ref 6–24)
Bilirubin Total: 0.4 mg/dL (ref 0.0–1.2)
CO2: 28 mmol/L (ref 20–29)
Calcium: 9.5 mg/dL (ref 8.7–10.2)
Chloride: 98 mmol/L (ref 96–106)
Creatinine, Ser: 0.82 mg/dL (ref 0.57–1.00)
GFR calc Af Amer: 95 mL/min/{1.73_m2} (ref >59)
GFR calc non Af Amer: 83 mL/min/{1.73_m2} (ref >59)
Globulin, Total: 2.4 g/dL (ref 1.5–4.5)
Glucose: 57 mg/dL — ABNORMAL LOW (ref 65–99)
Potassium: 4.4 mmol/L (ref 3.5–5.2)
Sodium: 137 mmol/L (ref 134–144)
Total Protein: 7.3 g/dL (ref 6.0–8.5)

## 2020-07-30 LAB — MICROSCOPIC EXAMINATION
Bacteria, UA: NONE SEEN
Casts: NONE SEEN /lpf
Epithelial Cells (non renal): NONE SEEN /hpf (ref 0–10)
RBC, Urine: NONE SEEN /hpf (ref 0–2)
WBC, UA: NONE SEEN /hpf (ref 0–5)

## 2020-07-30 LAB — CYCLIC CITRUL PEPTIDE ANTIBODY, IGG/IGA: Cyclic Citrullin Peptide Ab: 5 units (ref 0–19)

## 2020-07-30 LAB — C3 AND C4
Complement C3, Serum: 109 mg/dL (ref 82–167)
Complement C4, Serum: 24 mg/dL (ref 12–38)

## 2020-07-30 LAB — RHEUMATOID FACTOR: Rheumatoid fact SerPl-aCnc: <10 IU/mL (ref 0.0–13.9)

## 2020-07-30 LAB — SEDIMENTATION RATE: Sed Rate: 8 mm/hr (ref 0–40)

## 2020-07-30 LAB — ANTI-DNA ANTIBODY, DOUBLE-STRANDED: dsDNA Ab: 1 IU/mL (ref 0–9)

## 2020-07-31 ENCOUNTER — Other Ambulatory Visit: Payer: Self-pay | Admitting: *Deleted

## 2020-07-31 ENCOUNTER — Telehealth: Payer: Self-pay | Admitting: Family Medicine

## 2020-07-31 DIAGNOSIS — M359 Systemic involvement of connective tissue, unspecified: Secondary | ICD-10-CM

## 2020-07-31 NOTE — Progress Notes (Signed)
Labs are stable.  Serum glucose is low and urine shows 1+ protein and 3+ glucose.  She should come in to have a repeat UA and urine protein creatinine ratio

## 2020-07-31 NOTE — Telephone Encounter (Signed)
INR is 2.2

## 2020-08-01 ENCOUNTER — Encounter: Payer: Self-pay | Admitting: Family Medicine

## 2020-08-01 ENCOUNTER — Other Ambulatory Visit: Payer: Self-pay | Admitting: Family Medicine

## 2020-08-01 DIAGNOSIS — M1712 Unilateral primary osteoarthritis, left knee: Secondary | ICD-10-CM

## 2020-08-01 DIAGNOSIS — M1711 Unilateral primary osteoarthritis, right knee: Secondary | ICD-10-CM

## 2020-08-04 ENCOUNTER — Other Ambulatory Visit: Payer: Self-pay | Admitting: General Surgery

## 2020-08-04 ENCOUNTER — Ambulatory Visit
Admission: RE | Admit: 2020-08-04 | Discharge: 2020-08-04 | Disposition: A | Discharge status: Home / Self Care | Payer: 59 | Source: Ambulatory Visit | Attending: General Surgery | Admitting: General Surgery

## 2020-08-04 ENCOUNTER — Other Ambulatory Visit: Payer: Self-pay

## 2020-08-04 DIAGNOSIS — R921 Mammographic calcification found on diagnostic imaging of breast: Secondary | ICD-10-CM

## 2020-08-04 DIAGNOSIS — N6310 Unspecified lump in the right breast, unspecified quadrant: Secondary | ICD-10-CM

## 2020-08-04 IMAGING — US US  BREAST BX W/ LOC DEV 1ST LESION IMG BX SPEC US GUIDE*R*
1 series · 12 of 12 positions shown · non-contrast
Comparison: Previous exams.
COMPARISON: Previous exams.

Addendum:
CLINICAL DATA: Patient presents for ultrasound-guided core needle
biopsy of a 2.6 cm right breast mass as well as stereotactic core
needle biopsy of right breast calcifications. Two small groups of
calcifications in the right breast were recommended for biopsy, only
1 of which was faintly visualized during the stereotactic core
needle procedure.

EXAM:
RIGHT BREAST ULTRASOUND-GUIDED CORE NEEDLE BIOPSY
RIGHT BREAST STEREOTACTIC CORE NEEDLE BIOPSY

[Series 1: us breast bx w/ loc dev 1st lesion img bx spec us  · 0.07mm/px · 12 of 12 slices shown]
[im 1/12]
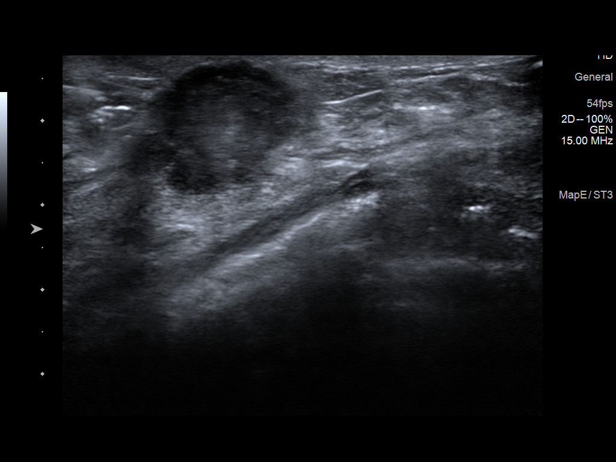
[im 2/12]
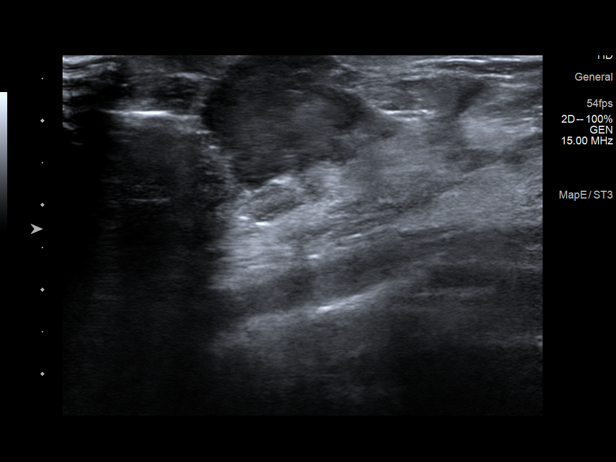
[im 3/12]
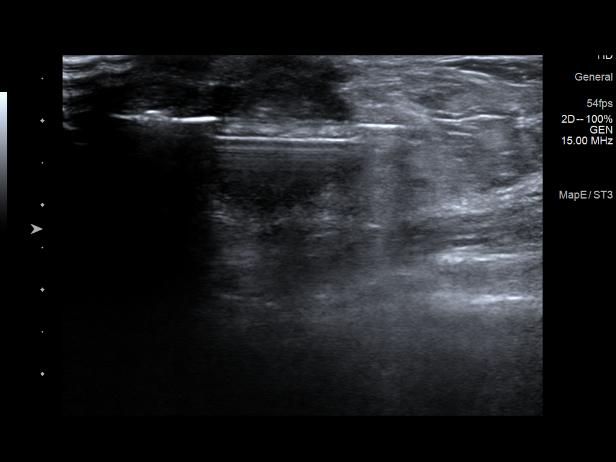
[im 4/12]
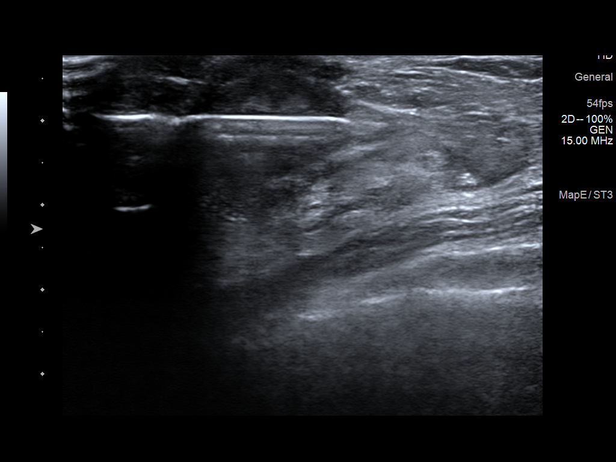
[im 5/12]
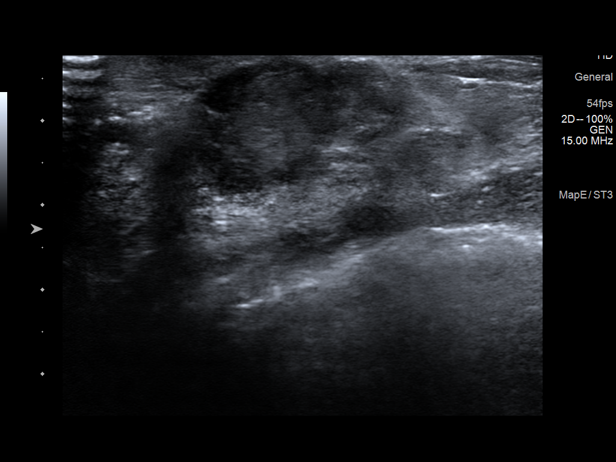
[im 6/12]
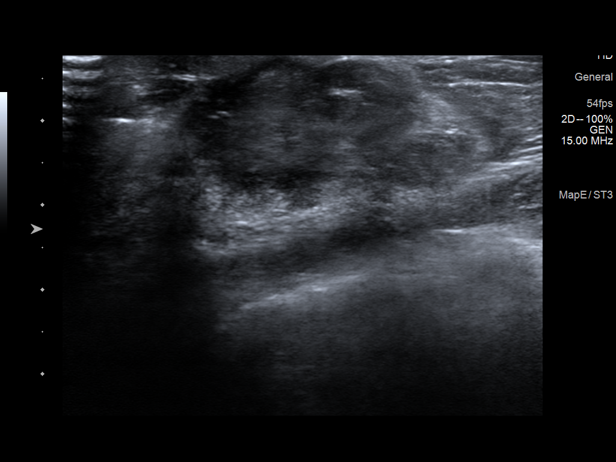
[im 7/12]
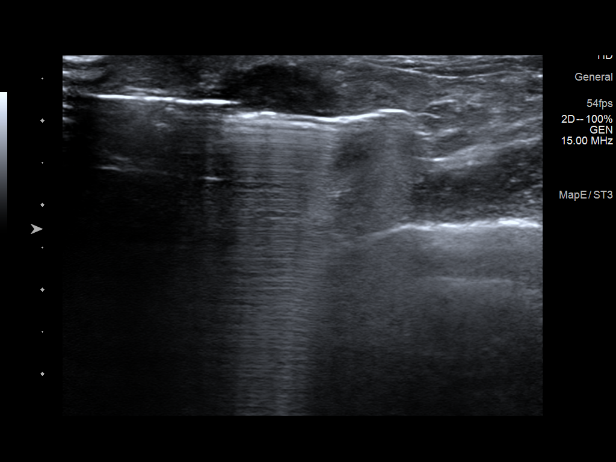
[im 8/12]
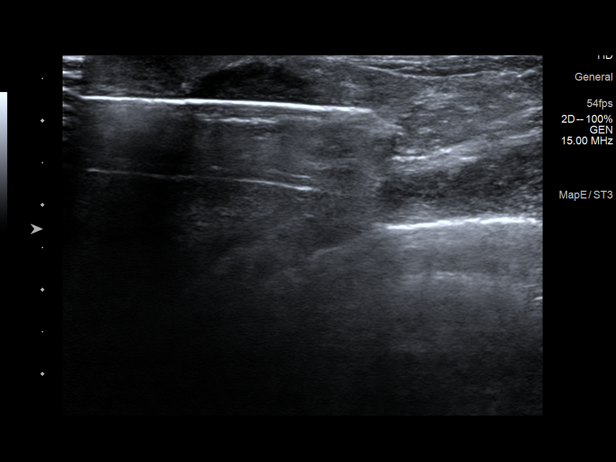
[im 9/12]
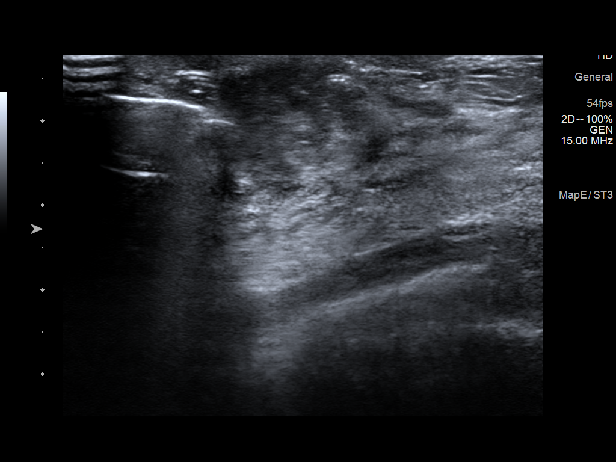
[im 10/12]
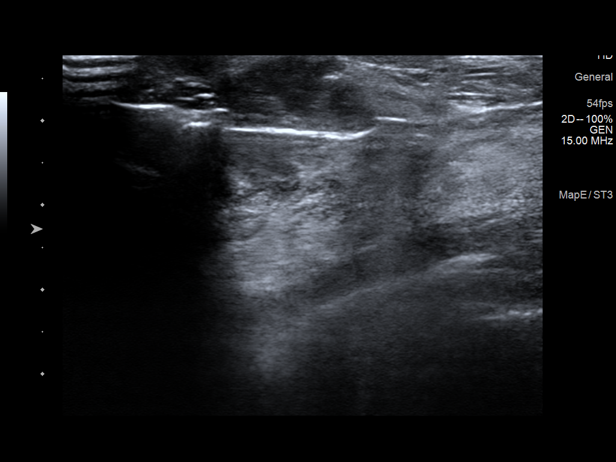
[im 11/12]
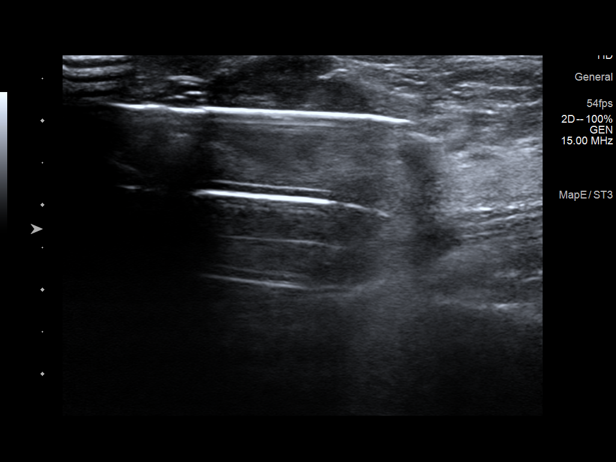
[im 12/12]
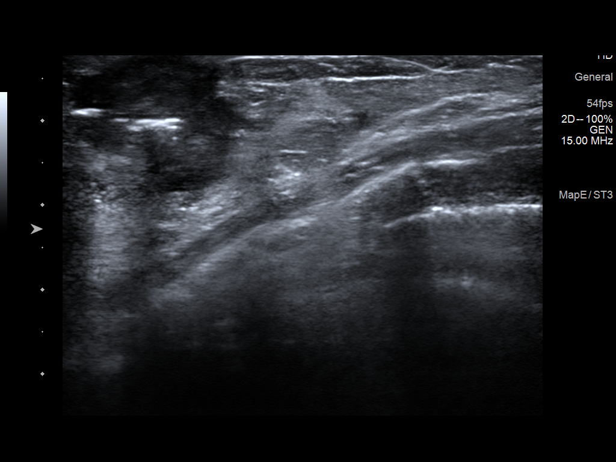

[12 of 12 positions shown; findings below may reference images not displayed]

FINDINGS: Ultrasound-guided biopsy:

I met with the patient and we discussed the procedure of
ultrasound-guided biopsy, including benefits and alternatives. We
discussed the high likelihood of a successful procedure. We
discussed the risks of the procedure, including infection, bleeding,
tissue injury, clip migration, and inadequate sampling. Informed
written consent was given. The usual time-out protocol was performed
immediately prior to the procedure.

Lesion #1: Right breast mass

Lesion quadrant: Lower inner quadrant, near 6 o'clock, retroareolar.

Using sterile technique and 1% Lidocaine as local anesthetic, under
direct ultrasound visualization, a 12 gauge JAIME RANULFO device was
used to perform biopsy of the 2.6 cm 6 o'clock retroareolar right
breast mass using a lateral approach. At the conclusion of the
procedure ribbon shaped tissue marker clip was deployed into the
biopsy cavity.

Lesion #2: Right breast calcifications.

Lesion quadrant: Posterior, lower slightly outer quadrant.

The patient and I discussed the procedure of stereotactic-guided
biopsy including benefits and alternatives. We discussed the high
likelihood of a successful procedure. We discussed the risks of the
procedure including infection, bleeding, tissue injury, clip
migration, and inadequate sampling. Informed written consent was
given. The usual time out protocol was performed immediately prior
to the procedure.

Using sterile technique and 1% Lidocaine as local anesthetic, under
stereotactic guidance, a 9 gauge vacuum assisted device was used to
perform core needle biopsy of subtle calcifications along the
posterior margin of the fibroglandular tissue of the lower slightly
outer right breast was performed using a superior approach. Specimen
radiograph was performed showing a few possible subtle
calcifications in 1 specimen. Specimens with calcifications are
identified for pathology. Patient had a near syncopal episode during
the procedure and no additional specimens were therefore acquired.

Lesion quadrant: Lower outer quadrant

At the conclusion of the procedure, coil shaped tissue marker clip
was deployed into the biopsy cavity. Follow-up 2-view mammogram was
performed and dictated separately.
IMPRESSION: Ultrasound-guided biopsy of the right breast. No apparent
complications.

Stereotactic-guided biopsy of the right breast. No apparent
complications.

ADDENDUM:
Pathology revealed GRADE III INVASIVE DUCTAL CARCINOMA of the RIGHT
breast, 6 o'clock, retroareolar. This was found to be concordant by
Dr. JAIME RANULFO.

Pathology revealed BENIGN BREAST TISSUE of the RIGHT breast,
posterior, lower, slightly outer quadrant. This was found to be
concordant by Dr. JAIME RANULFO.

Pathology results were discussed with the patient by telephone. The
patient reported doing well after the biopsies with tenderness at
the sites. Post biopsy instructions and care were reviewed and
questions were answered. The patient was encouraged to call The

At the request of Dr. JAIME RANULFO, the patient was referred to [REDACTED] [REDACTED] at [REDACTED] on [DATE] to follow up with him and
other team members.

Breast MRI strongly recommended for extent. Further recommendations
will be guided by these results.

Pathology results reported by JAIME RANULFO RN on [DATE].

*** End of Addendum ***
FINDINGS: Ultrasound-guided biopsy:

I met with the patient and we discussed the procedure of
ultrasound-guided biopsy, including benefits and alternatives. We
discussed the high likelihood of a successful procedure. We
discussed the risks of the procedure, including infection, bleeding,
tissue injury, clip migration, and inadequate sampling. Informed
written consent was given. The usual time-out protocol was performed
immediately prior to the procedure.

Lesion #1: Right breast mass

Lesion quadrant: Lower inner quadrant, near 6 o'clock, retroareolar.

Using sterile technique and 1% Lidocaine as local anesthetic, under
direct ultrasound visualization, a 12 gauge JAIME RANULFO device was
used to perform biopsy of the 2.6 cm 6 o'clock retroareolar right
breast mass using a lateral approach. At the conclusion of the
procedure ribbon shaped tissue marker clip was deployed into the
biopsy cavity.

Lesion #2: Right breast calcifications.

Lesion quadrant: Posterior, lower slightly outer quadrant.

The patient and I discussed the procedure of stereotactic-guided
biopsy including benefits and alternatives. We discussed the high
likelihood of a successful procedure. We discussed the risks of the
procedure including infection, bleeding, tissue injury, clip
migration, and inadequate sampling. Informed written consent was
given. The usual time out protocol was performed immediately prior
to the procedure.

Using sterile technique and 1% Lidocaine as local anesthetic, under
stereotactic guidance, a 9 gauge vacuum assisted device was used to
perform core needle biopsy of subtle calcifications along the
posterior margin of the fibroglandular tissue of the lower slightly
outer right breast was performed using a superior approach. Specimen
radiograph was performed showing a few possible subtle
calcifications in 1 specimen. Specimens with calcifications are
identified for pathology. Patient had a near syncopal episode during
the procedure and no additional specimens were therefore acquired.

Lesion quadrant: Lower outer quadrant

At the conclusion of the procedure, coil shaped tissue marker clip
was deployed into the biopsy cavity. Follow-up 2-view mammogram was
performed and dictated separately.
IMPRESSION: Ultrasound-guided biopsy of the right breast. No apparent
complications.

Stereotactic-guided biopsy of the right breast. No apparent
complications.

## 2020-08-04 IMAGING — MG MM BREAST BX W/ LOC DEV 1ST LESION IMAGE BX SPEC STEREO GUIDE*R*
7 of 12 series · 7 of 20 positions shown · non-contrast
Comparison: Previous exams.
COMPARISON: Previous exams.

Addendum:
CLINICAL DATA: Patient presents for ultrasound-guided core needle
biopsy of a 2.6 cm right breast mass as well as stereotactic core
needle biopsy of right breast calcifications. Two small groups of
calcifications in the right breast were recommended for biopsy, only
1 of which was faintly visualized during the stereotactic core
needle procedure.

EXAM:
RIGHT BREAST ULTRASOUND-GUIDED CORE NEEDLE BIOPSY
RIGHT BREAST STEREOTACTIC CORE NEEDLE BIOPSY

[R (1 of 7)]
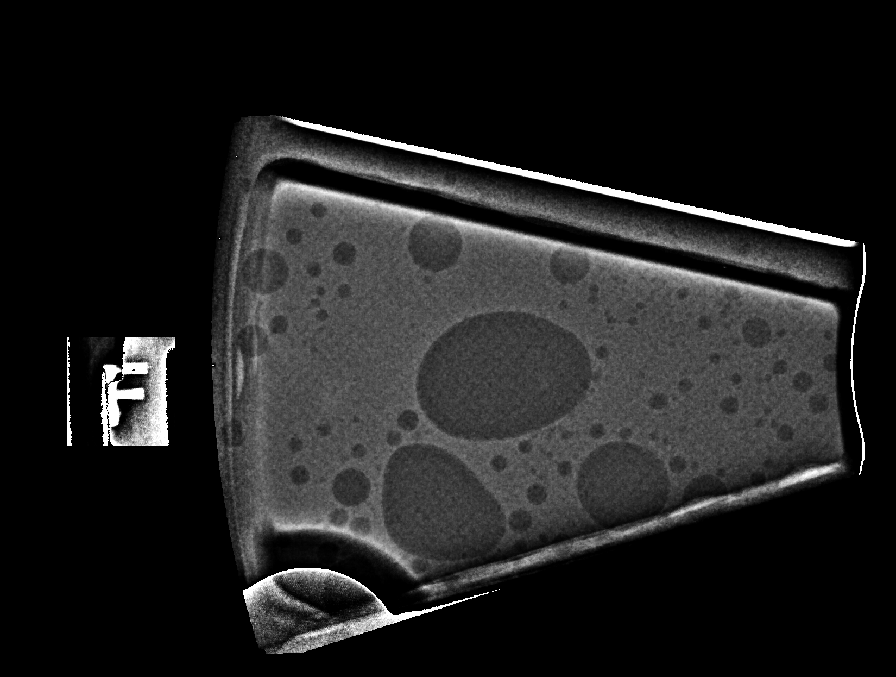

[R (2 of 7)]
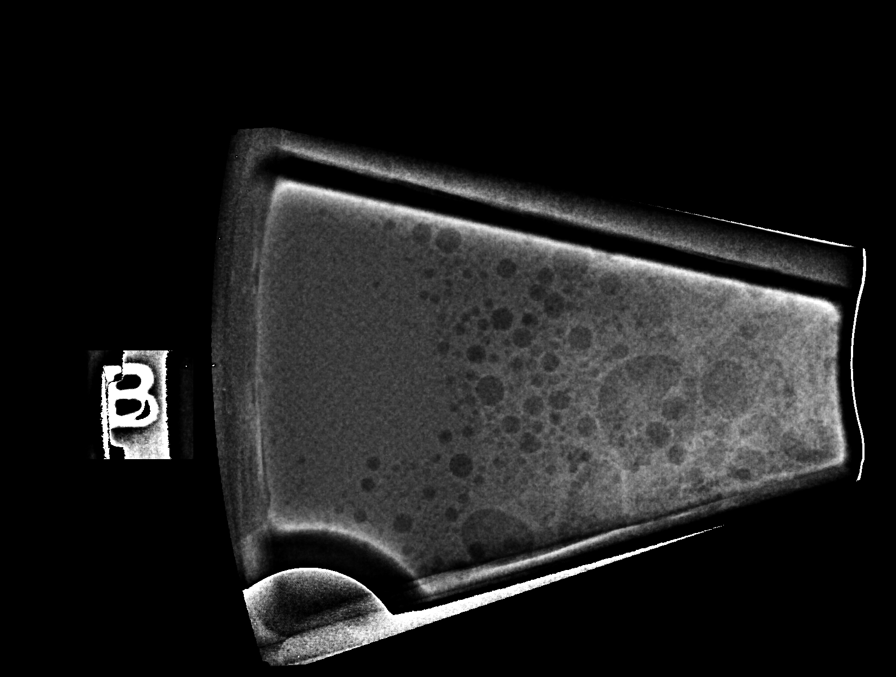

[R (3 of 7)]
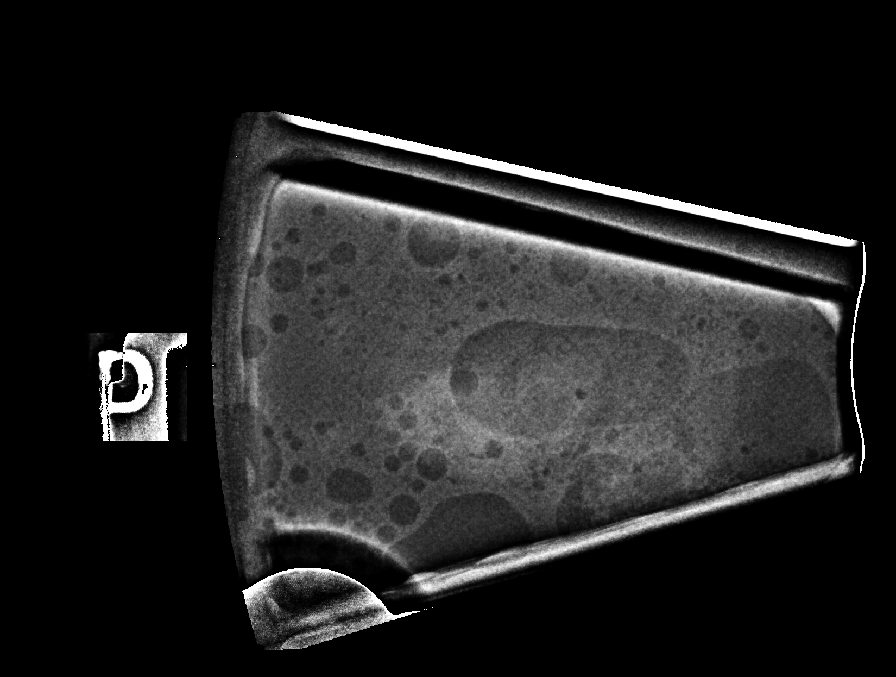

[R (4 of 7)]
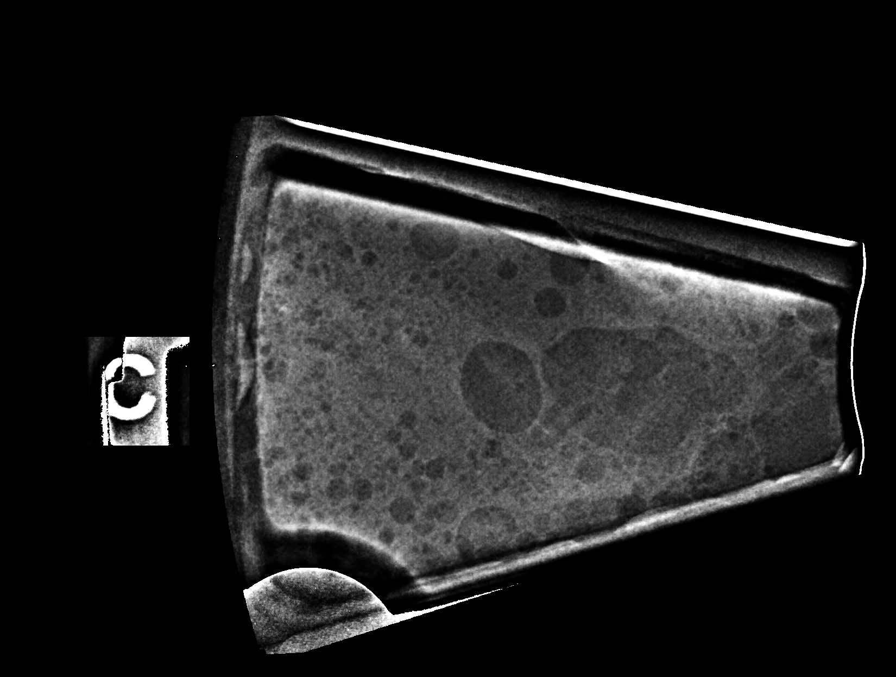

[R (5 of 7)]
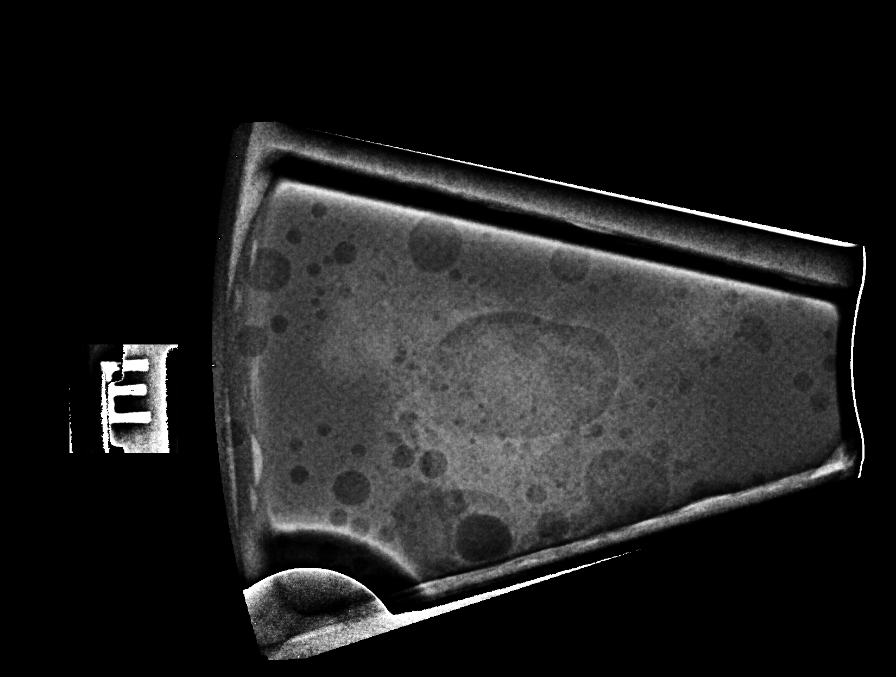

[R (6 of 7)]
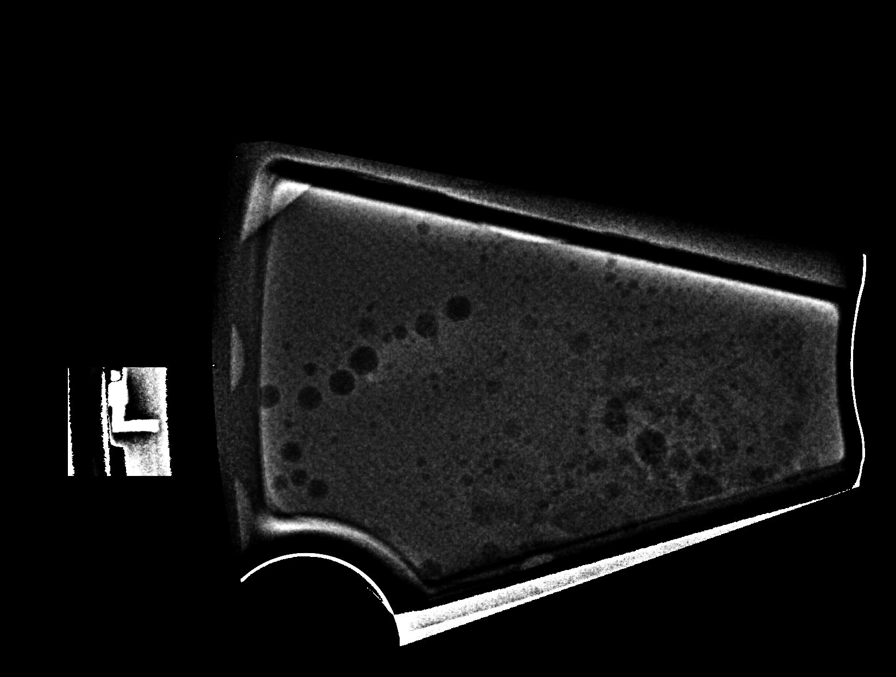

[R (7 of 7)]
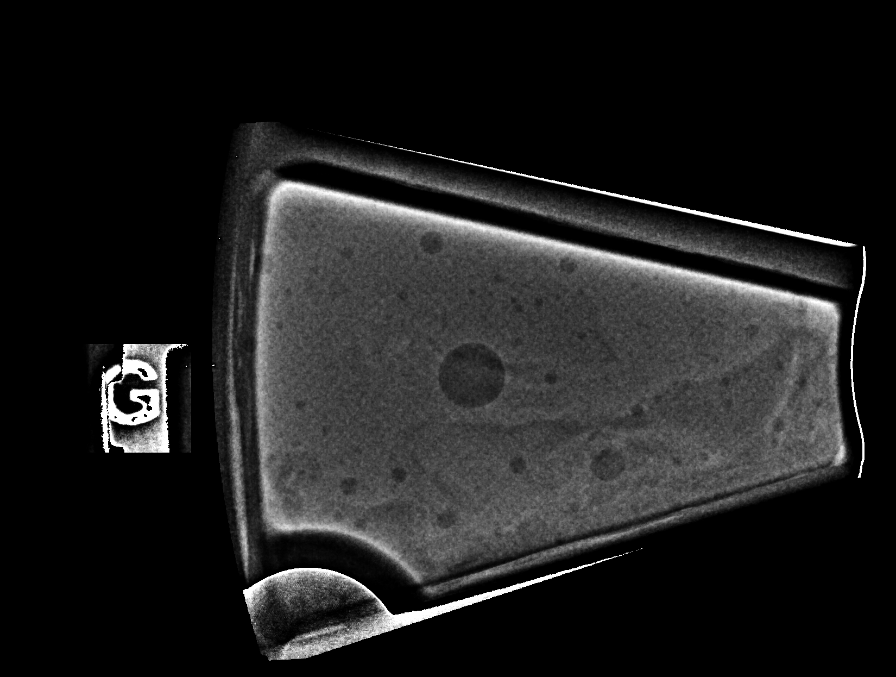

[7 of 20 positions shown; findings below may reference images not displayed]

FINDINGS: Ultrasound-guided biopsy:

I met with the patient and we discussed the procedure of
ultrasound-guided biopsy, including benefits and alternatives. We
discussed the high likelihood of a successful procedure. We
discussed the risks of the procedure, including infection, bleeding,
tissue injury, clip migration, and inadequate sampling. Informed
written consent was given. The usual time-out protocol was performed
immediately prior to the procedure.

Lesion #1: Right breast mass

Lesion quadrant: Lower inner quadrant, near 6 o'clock, retroareolar.

Using sterile technique and 1% Lidocaine as local anesthetic, under
direct ultrasound visualization, a 12 gauge JAIME RANULFO device was
used to perform biopsy of the 2.6 cm 6 o'clock retroareolar right
breast mass using a lateral approach. At the conclusion of the
procedure ribbon shaped tissue marker clip was deployed into the
biopsy cavity.

Lesion #2: Right breast calcifications.

Lesion quadrant: Posterior, lower slightly outer quadrant.

The patient and I discussed the procedure of stereotactic-guided
biopsy including benefits and alternatives. We discussed the high
likelihood of a successful procedure. We discussed the risks of the
procedure including infection, bleeding, tissue injury, clip
migration, and inadequate sampling. Informed written consent was
given. The usual time out protocol was performed immediately prior
to the procedure.

Using sterile technique and 1% Lidocaine as local anesthetic, under
stereotactic guidance, a 9 gauge vacuum assisted device was used to
perform core needle biopsy of subtle calcifications along the
posterior margin of the fibroglandular tissue of the lower slightly
outer right breast was performed using a superior approach. Specimen
radiograph was performed showing a few possible subtle
calcifications in 1 specimen. Specimens with calcifications are
identified for pathology. Patient had a near syncopal episode during
the procedure and no additional specimens were therefore acquired.

Lesion quadrant: Lower outer quadrant

At the conclusion of the procedure, coil shaped tissue marker clip
was deployed into the biopsy cavity. Follow-up 2-view mammogram was
performed and dictated separately.
IMPRESSION: Ultrasound-guided biopsy of the right breast. No apparent
complications.

Stereotactic-guided biopsy of the right breast. No apparent
complications.

ADDENDUM:
Pathology revealed GRADE III INVASIVE DUCTAL CARCINOMA of the RIGHT
breast, 6 o'clock, retroareolar. This was found to be concordant by
Dr. JAIME RANULFO.

Pathology revealed BENIGN BREAST TISSUE of the RIGHT breast,
posterior, lower, slightly outer quadrant. This was found to be
concordant by Dr. JAIME RANULFO.

Pathology results were discussed with the patient by telephone. The
patient reported doing well after the biopsies with tenderness at
the sites. Post biopsy instructions and care were reviewed and
questions were answered. The patient was encouraged to call The

At the request of Dr. JAIME RANULFO, the patient was referred to [REDACTED] [REDACTED] at [REDACTED] on [DATE] to follow up with him and
other team members.

Breast MRI strongly recommended for extent. Further recommendations
will be guided by these results.

Pathology results reported by JAIME RANULFO RN on [DATE].

*** End of Addendum ***
FINDINGS: Ultrasound-guided biopsy:

I met with the patient and we discussed the procedure of
ultrasound-guided biopsy, including benefits and alternatives. We
discussed the high likelihood of a successful procedure. We
discussed the risks of the procedure, including infection, bleeding,
tissue injury, clip migration, and inadequate sampling. Informed
written consent was given. The usual time-out protocol was performed
immediately prior to the procedure.

Lesion #1: Right breast mass

Lesion quadrant: Lower inner quadrant, near 6 o'clock, retroareolar.

Using sterile technique and 1% Lidocaine as local anesthetic, under
direct ultrasound visualization, a 12 gauge JAIME RANULFO device was
used to perform biopsy of the 2.6 cm 6 o'clock retroareolar right
breast mass using a lateral approach. At the conclusion of the
procedure ribbon shaped tissue marker clip was deployed into the
biopsy cavity.

Lesion #2: Right breast calcifications.

Lesion quadrant: Posterior, lower slightly outer quadrant.

The patient and I discussed the procedure of stereotactic-guided
biopsy including benefits and alternatives. We discussed the high
likelihood of a successful procedure. We discussed the risks of the
procedure including infection, bleeding, tissue injury, clip
migration, and inadequate sampling. Informed written consent was
given. The usual time out protocol was performed immediately prior
to the procedure.

Using sterile technique and 1% Lidocaine as local anesthetic, under
stereotactic guidance, a 9 gauge vacuum assisted device was used to
perform core needle biopsy of subtle calcifications along the
posterior margin of the fibroglandular tissue of the lower slightly
outer right breast was performed using a superior approach. Specimen
radiograph was performed showing a few possible subtle
calcifications in 1 specimen. Specimens with calcifications are
identified for pathology. Patient had a near syncopal episode during
the procedure and no additional specimens were therefore acquired.

Lesion quadrant: Lower outer quadrant

At the conclusion of the procedure, coil shaped tissue marker clip
was deployed into the biopsy cavity. Follow-up 2-view mammogram was
performed and dictated separately.
IMPRESSION: Ultrasound-guided biopsy of the right breast. No apparent
complications.

Stereotactic-guided biopsy of the right breast. No apparent
complications.

## 2020-08-04 IMAGING — MG MM BREAST LOCALIZATION CLIP
4 series · 4 of 12 positions shown · non-contrast
Comparison: Previous exam(s).

CLINICAL DATA: Evaluate post biopsy marker clip placements
following ultrasound and stereotactic core needle biopsy of the
right breast.

EXAM:
DIAGNOSTIC RIGHT MAMMOGRAM POST ULTRASOUND AND STEREOTACTIC BIOPSY:
2 BIOPSIES PERFORMED.

[R ML synth-2D]
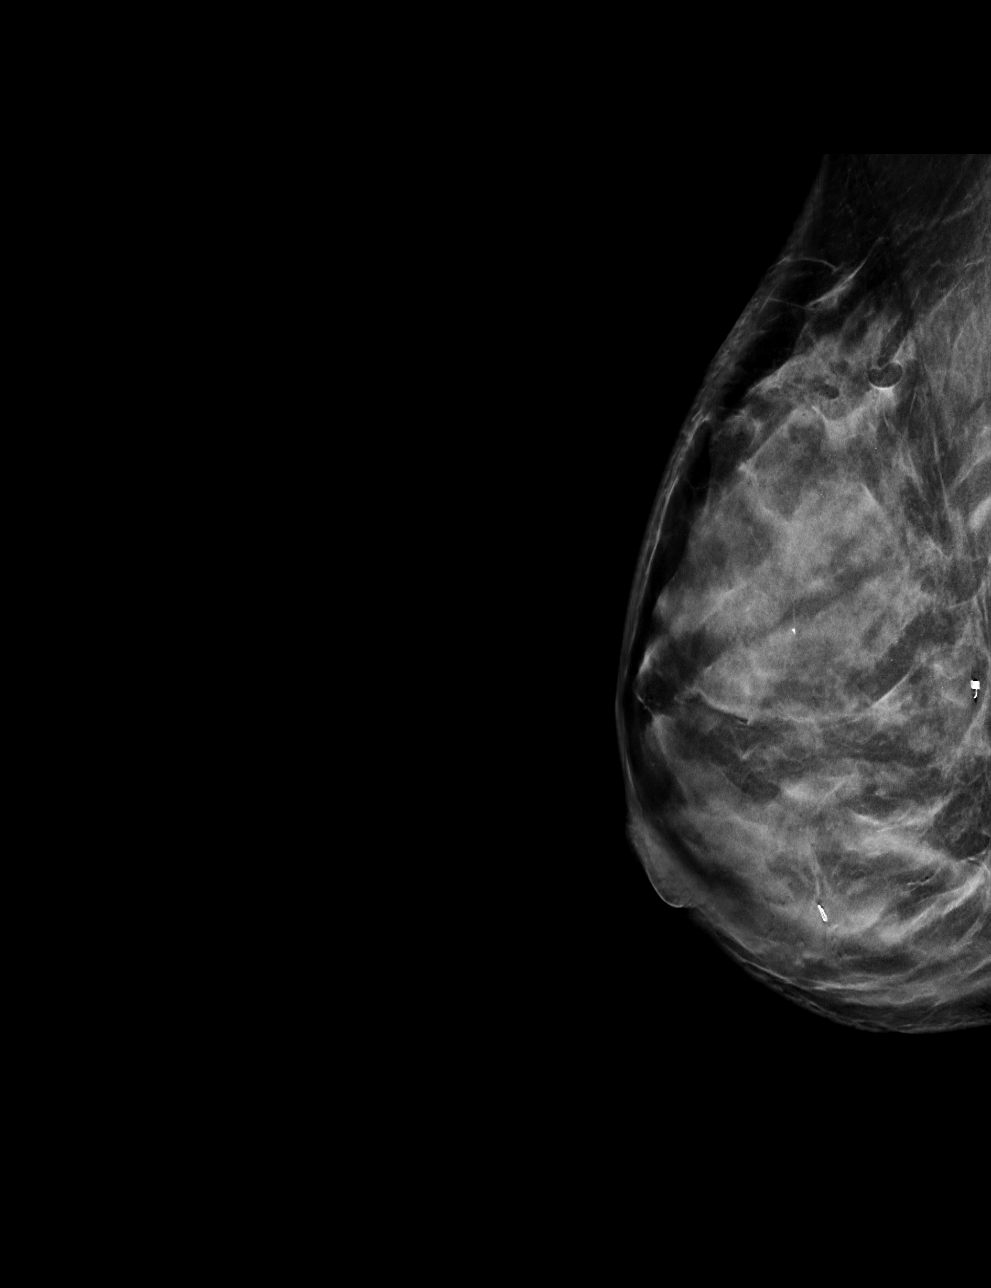

[R CC synth-2D]
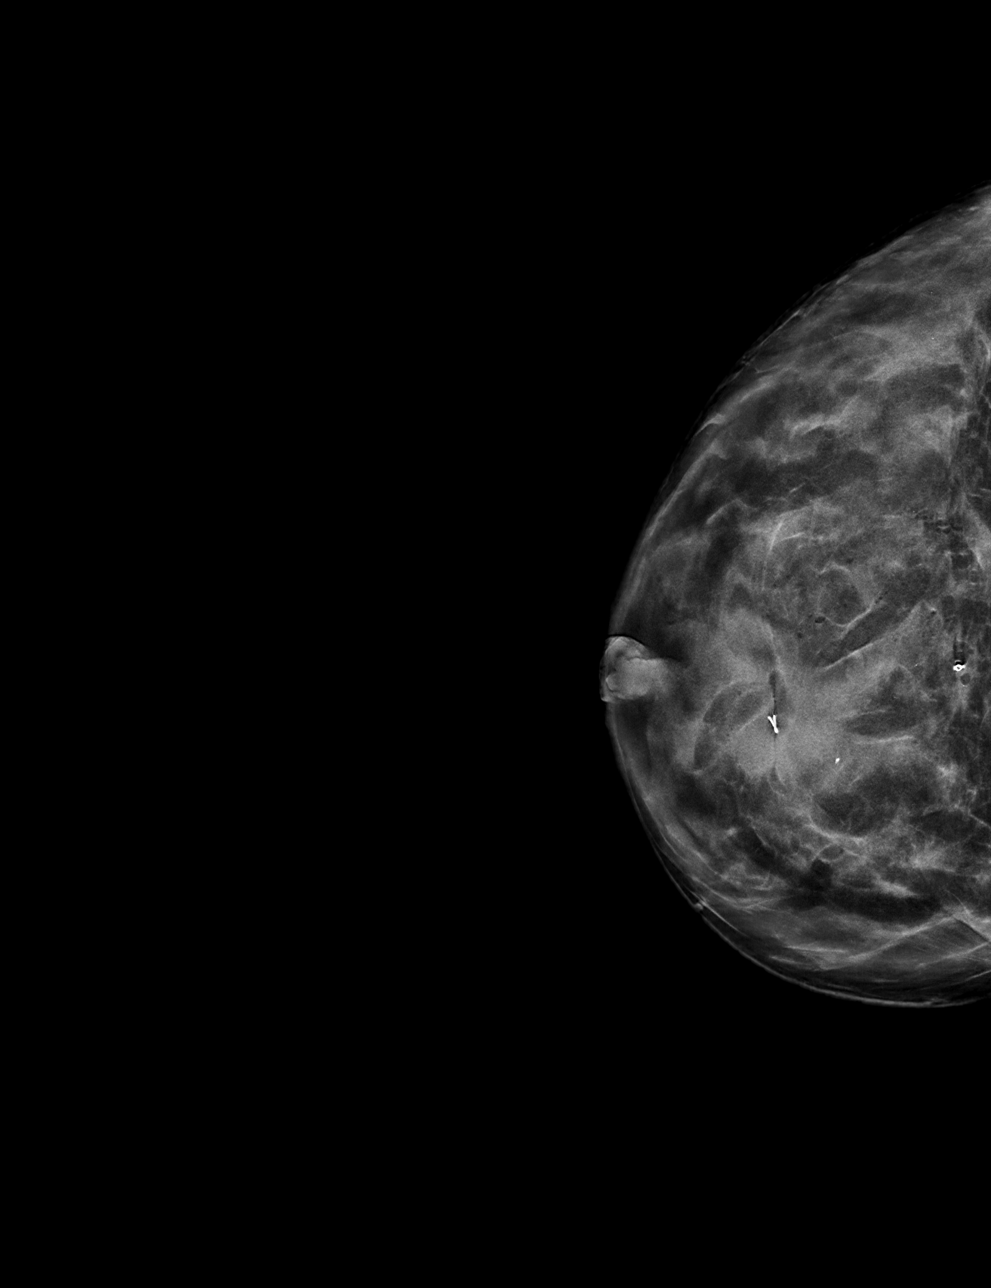

[R ML tomo · tomo slice 37/73.0]
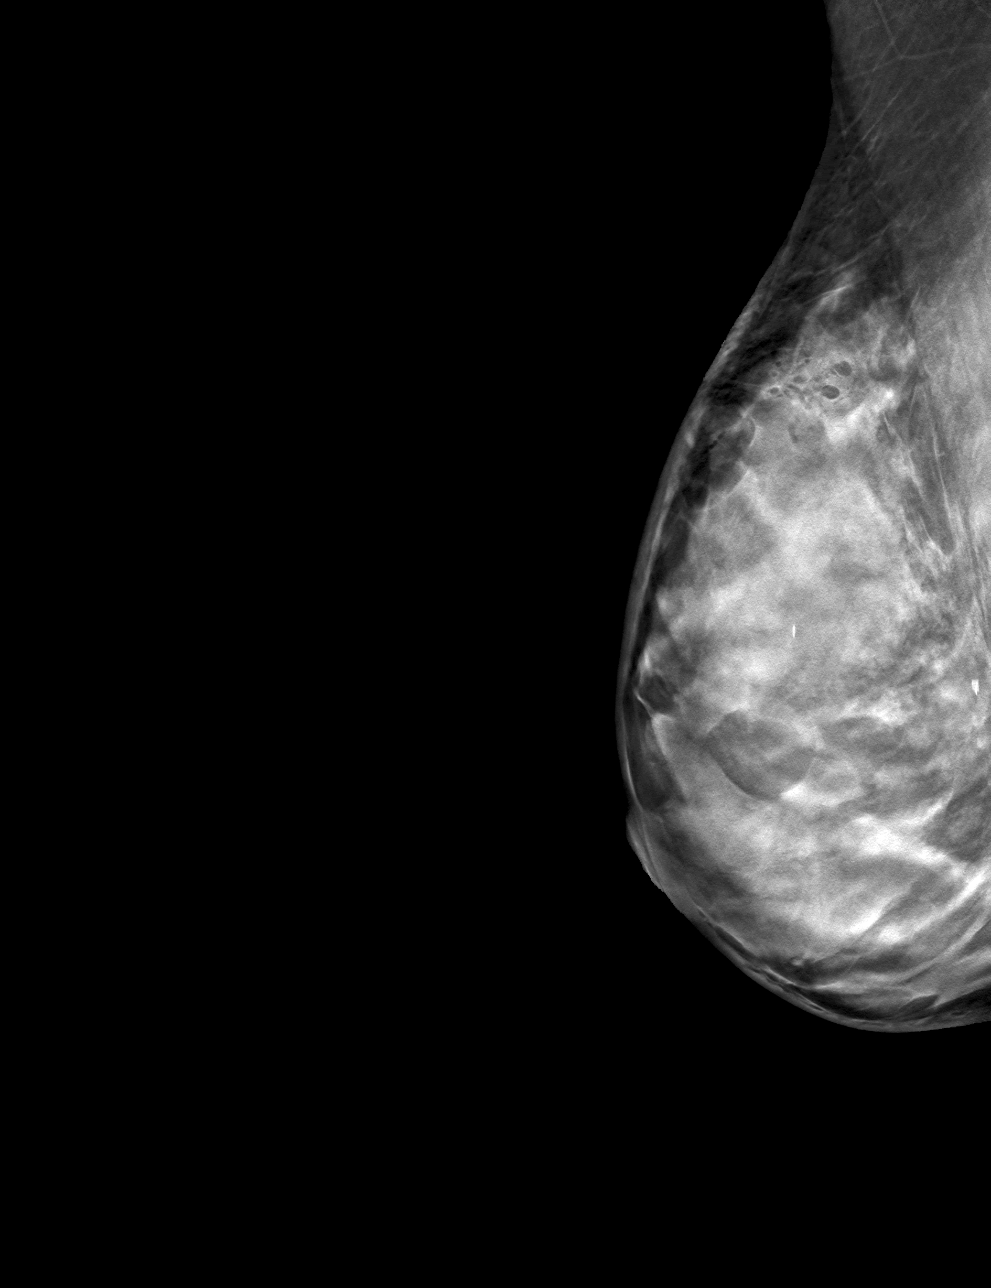

[R CC tomo · tomo slice 37/73.0]
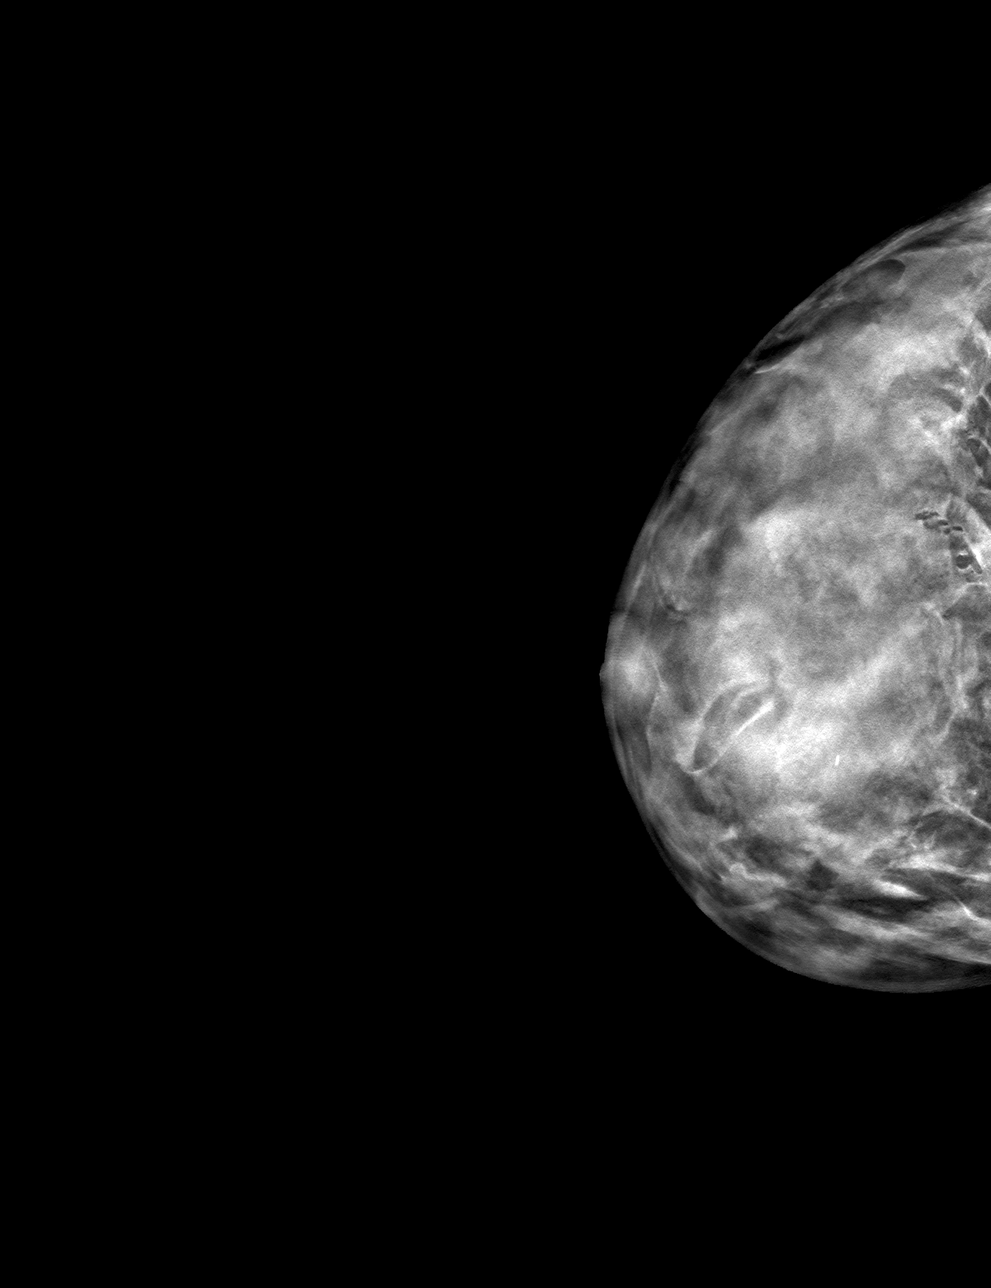

[4 of 12 positions shown; findings below may reference images not displayed]

FINDINGS: Mammographic images were obtained following ultrasound guided biopsy
of a 2.6 cm right breast mass. The biopsy marking clip is in
expected position at the site of biopsy.

Mammographic images were obtained following stereotactic guided
biopsy of subtle calcifications along the posterior, inferior and
slightly outer aspect. The biopsy marking clip is in expected
position at the site of biopsy.
IMPRESSION: Appropriate positioning of the ribbon shaped biopsy marking clip at
the site of biopsy in the 6 o'clock position of the right breast.

Appropriate positioning of the coil shaped biopsy marking clip at
the site of biopsy in the in the posterior, slightly lateral and
inferior aspect of the right breast, adjacent to at least 1 punctate
calcification.

Final Assessment: Post Procedure Mammograms for Marker Placement

## 2020-08-07 ENCOUNTER — Telehealth: Payer: Self-pay

## 2020-08-07 ENCOUNTER — Other Ambulatory Visit: Payer: Self-pay | Admitting: General Surgery

## 2020-08-07 DIAGNOSIS — C50111 Malignant neoplasm of central portion of right female breast: Secondary | ICD-10-CM

## 2020-08-07 MED ORDER — AMPHETAMINE-DEXTROAMPHET ER 20 MG PO CP24
20.0000 mg | ORAL_CAPSULE | Freq: Every day | ORAL | 0 refills | Status: DC
Start: 2020-08-07 — End: 2020-09-11

## 2020-08-07 NOTE — Telephone Encounter (Signed)
Last Visit: 07/27/2020 Next Visit: 12/28/2020  Last Fill: 07/07/2020  Okay to refill Adderall?

## 2020-08-07 NOTE — Telephone Encounter (Signed)
Patient left a voicemail requesting prescription refill of Adderall to be sent to [facility] at [place].

## 2020-08-08 ENCOUNTER — Other Ambulatory Visit (INDEPENDENT_AMBULATORY_CARE_PROVIDER_SITE_OTHER): Payer: Self-pay | Admitting: Family Medicine

## 2020-08-09 ENCOUNTER — Telehealth: Payer: Self-pay | Admitting: Family Medicine

## 2020-08-09 LAB — PROTIME-INR
INR: 1.6 — ABNORMAL HIGH (ref 0.9–1.2)
Prothrombin Time: 16.3 s — ABNORMAL HIGH (ref 9.1–12.0)

## 2020-08-09 NOTE — Telephone Encounter (Signed)
INR is a bit low at 1.6.

## 2020-08-09 NOTE — Progress Notes (Signed)
Repeat UA negative for protein.

## 2020-08-10 ENCOUNTER — Encounter: Payer: Self-pay | Admitting: *Deleted

## 2020-08-10 DIAGNOSIS — C50511 Malignant neoplasm of lower-outer quadrant of right female breast: Secondary | ICD-10-CM | POA: Insufficient documentation

## 2020-08-10 DIAGNOSIS — Z170 Estrogen receptor positive status [ER+]: Secondary | ICD-10-CM

## 2020-08-10 LAB — URINALYSIS, ROUTINE W REFLEX MICROSCOPIC
Bilirubin, UA: NEGATIVE
Glucose, UA: NEGATIVE
Ketones, UA: NEGATIVE
Leukocytes,UA: NEGATIVE
Nitrite, UA: NEGATIVE
Protein,UA: NEGATIVE
RBC, UA: NEGATIVE
Specific Gravity, UA: 1.006 (ref 1.005–1.030)
Urobilinogen, Ur: 0.2 mg/dL (ref 0.2–1.0)
pH, UA: 7 (ref 5.0–7.5)

## 2020-08-10 LAB — PROTEIN / CREATININE RATIO, URINE
Creatinine, Urine: 22.7 mg/dL
Protein, Ur: <4 mg/dL

## 2020-08-11 ENCOUNTER — Other Ambulatory Visit: Payer: Self-pay

## 2020-08-11 ENCOUNTER — Ambulatory Visit
Admission: RE | Admit: 2020-08-11 | Discharge: 2020-08-11 | Disposition: A | Discharge status: Home / Self Care | Payer: 59 | Source: Ambulatory Visit | Attending: General Surgery | Admitting: General Surgery

## 2020-08-11 DIAGNOSIS — C50111 Malignant neoplasm of central portion of right female breast: Secondary | ICD-10-CM

## 2020-08-11 IMAGING — MR MR BREAST BILAT WO/W CM
8 of 13 series · 29 of 48 positions shown · IV contrast (5 GADAVIST)
Comparison: Previous exams.  No previous breast MRI.

CLINICAL DATA: Known biopsy-proven RIGHT breast cancer
([DATE]).

LABS:  Not applicable
EXAM:
BILATERAL BREAST MRI WITH AND WITHOUT CONTRAST
TECHNIQUE: Multiplanar, multisequence MR images of both breasts were obtained
prior to and following the intravenous administration of 5 ml of
Gadavist

[Series 2: t2_tirm_tra ipat (a-p) · axial · 3.0mm · 0.70mm/px · 1 of 53 slices shown]
[im 1/53]
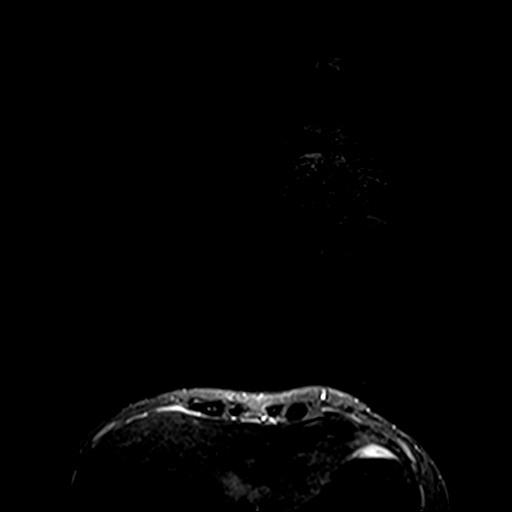

[Series 3: fl3d pre-cm no · axial · non-contrast · 1.2mm · 0.94mm/px · z∈[-31,+121]mm · 5 of 128 slices shown]
[im 1/128]
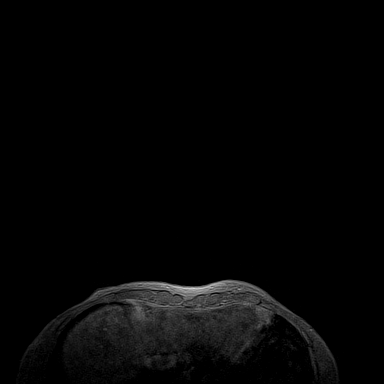
[im 32/128]
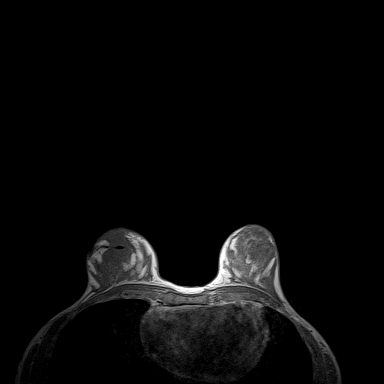
[im 64/128]
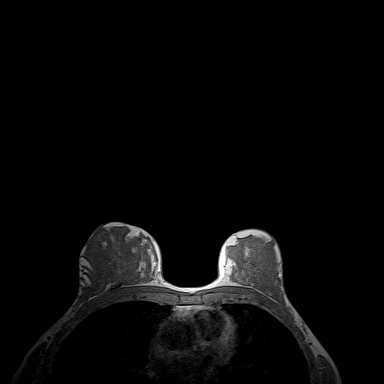
[im 96/128]
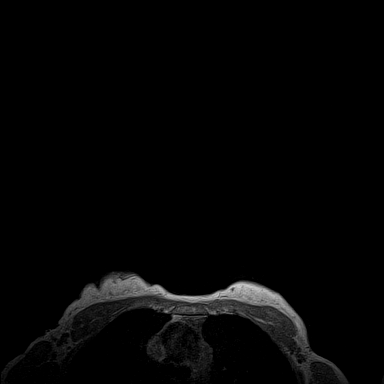
[im 128/128]
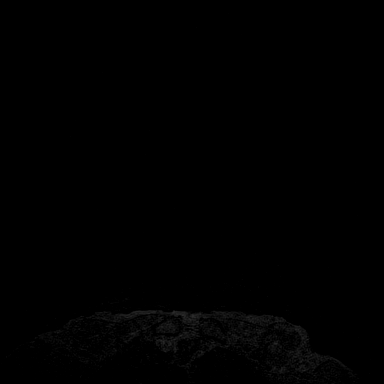

[Series 5: fl3d pre-cm · axial · non-contrast · 1.2mm · 0.94mm/px · z∈[-31,+121]mm · 5 of 128 slices shown]
[im 1/128]
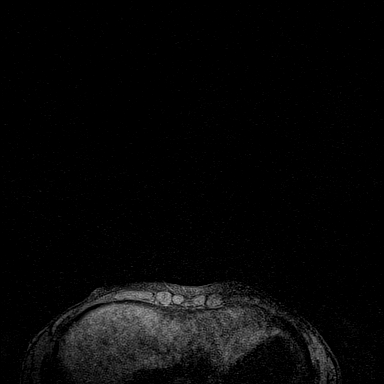
[im 32/128]
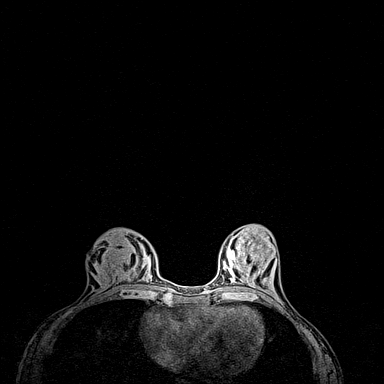
[im 64/128]
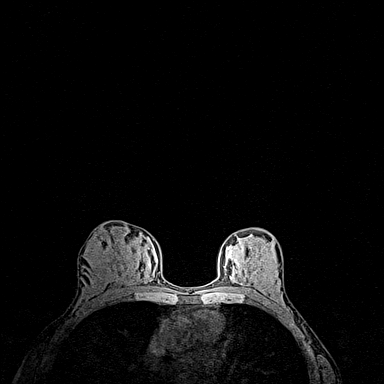
[im 96/128]
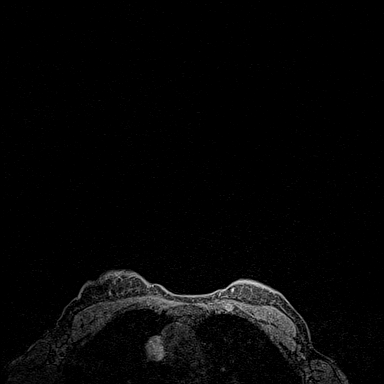
[im 128/128]
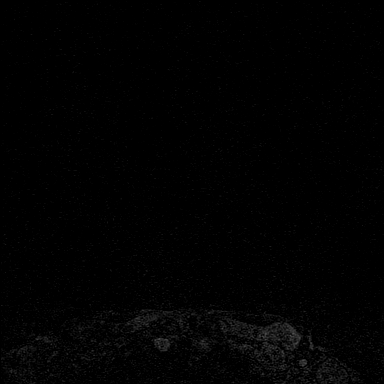

[Series 8: fl3d post-cm 20 · axial · 1.2mm · 0.94mm/px · z∈[-31,+121]mm · 5 of 128 slices shown (1 of 3)]
[im 1/128]
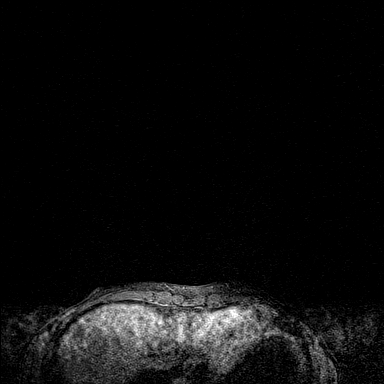
[im 32/128]
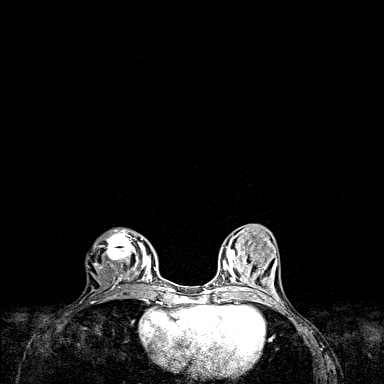
[im 64/128]
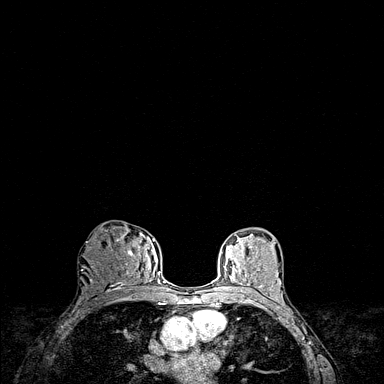
[im 96/128]
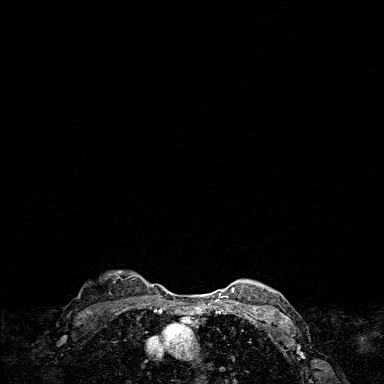
[im 128/128]
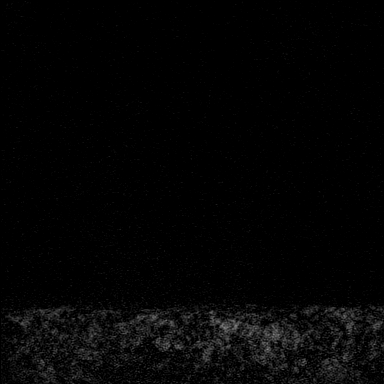

[Series 9: fl3d post-cm 20 · axial · 1.2mm · 0.94mm/px · z∈[-31,+121]mm · 5 of 128 slices shown (2 of 3)]
[im 1/128]
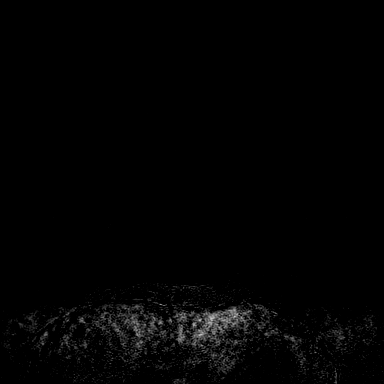
[im 32/128]
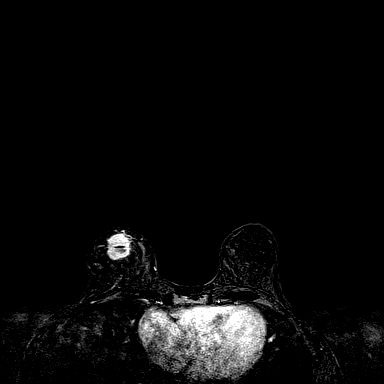
[im 64/128]
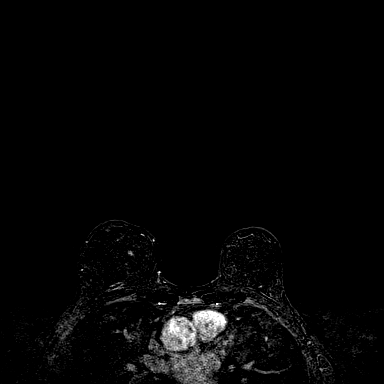
[im 96/128]
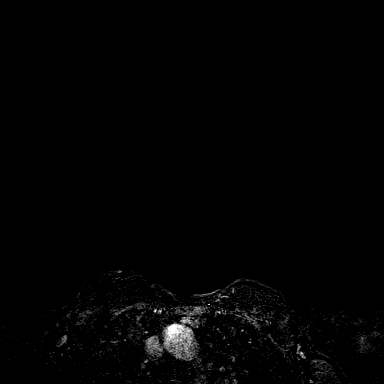
[im 128/128]
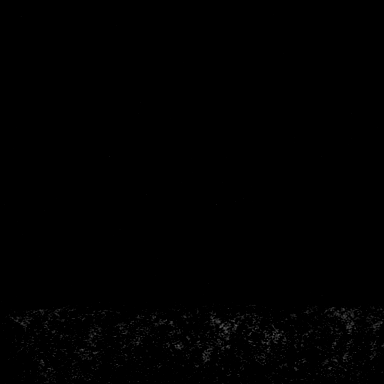

[Series 10: fl3d post-cm 20 · axial · 153.6mm · 0.94mm/px · 1 of 1 slices shown (3 of 3)]
[im 1/1]
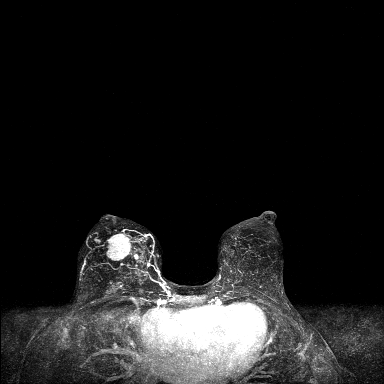

[Series 11: fl3d post-cm 3min · axial · 1.2mm · 0.94mm/px · z∈[-31,+121]mm · 5 of 128 slices shown]
[im 1/128]
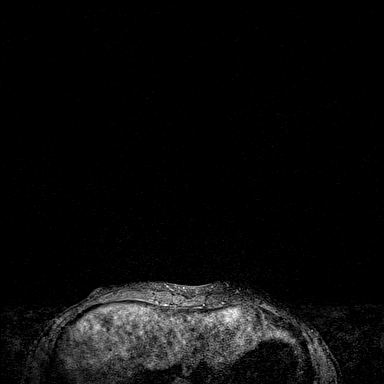
[im 32/128]
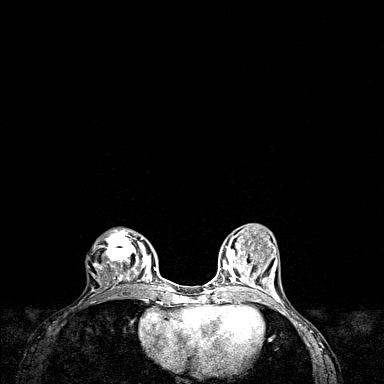
[im 64/128]
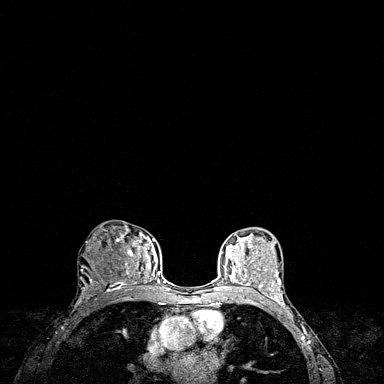
[im 96/128]
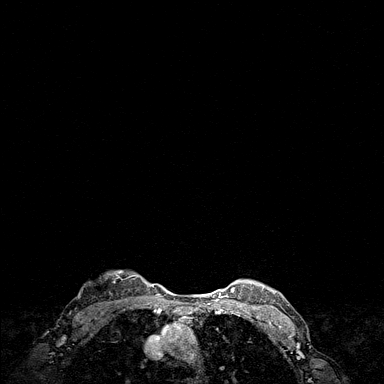
[im 128/128]
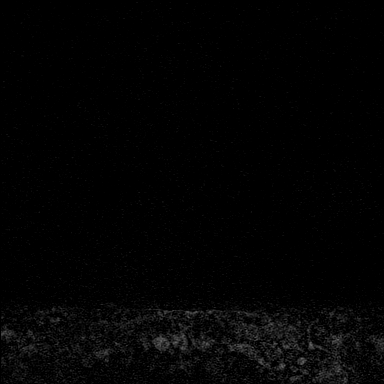

[Series 12: fl3d post-cm 3min_sub · axial · 1.2mm · 0.94mm/px · z∈[-31,-1]mm · 2 of 128 slices shown]
[im 1/128]
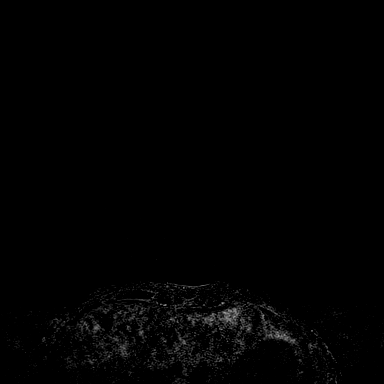
[im 26/128]
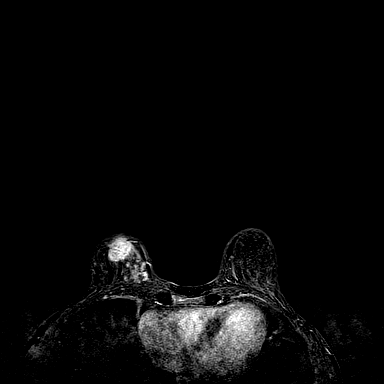

[29 of 48 positions shown; findings below may reference images not displayed]

Three-dimensional MR images were rendered by post-processing of the
original MR data on an independent workstation. The
three-dimensional MR images were interpreted, and findings are
reported in the following complete MRI report for this study. Three
dimensional images were evaluated at the independent interpreting
workstation using the DynaCAD thin client.
FINDINGS: Breast composition: d. Extreme fibroglandular tissue.

Background parenchymal enhancement: Moderate.

Right breast: Irregular enhancing mass within the lower RIGHT
breast, 6 o'clock axis, measuring 2.6 x 2.2 x 2.5 cm (craniocaudal
by transverse by AP dimensions), with associated biopsy clip
artifact, corresponding to patient's known biopsy-proven malignancy.
The mass is superficial, underlying the skin of the lower RIGHT
breast. Previous ultrasound showed probable skin involvement.

Additional oval circumscribed enhancing mass within the lower inner
quadrant of the RIGHT breast, at posterior depth, 4 o'clock axis
region, measuring 5 mm (axial series 9, image 94).

Additional irregular enhancing mass within the upper inner quadrant
of the RIGHT breast, at middle depth, measuring 1.2 x 0.7 cm (best
seen on sagittal reformatted series 100, images 194 through 197;
axial series 12, images 56 through 58).

Additional irregular enhancing mass within the upper-outer quadrant
of the RIGHT breast, at anterior depth, measuring 7 mm (axial series
12, image 61; sagittal reformatted series 100, image 224).

Additional biopsy clip artifact within the posterior RIGHT breast
corresponding to the site of benign stereotactic biopsy.

Left breast: No suspicious enhancing mass, non-mass enhancement or
secondary signs of malignancy.

Lymph nodes: No enlarged or morphologically abnormal lymph nodes are
identified within either axillary region or within the internal
mammary chain regions.

Ancillary findings:  Heterogeneous enhancement of the sternum.
IMPRESSION: 1. Known biopsy-proven invasive ductal carcinoma of the RIGHT
breast, 6 o'clock axis, measuring 2.6 cm, with associated biopsy
clip. This mass is superficial, underlying the skin of the lower
RIGHT breast. Previous ultrasound showed probable skin involvement.
2. Additional oval circumscribed enhancing mass within the lower
inner quadrant of the RIGHT breast, at posterior depth, measuring 5
mm.
3. Additional irregular enhancing mass within the upper inner
quadrant of the RIGHT breast, at middle depth, measuring 1.2 cm.
4. Additional irregular enhancing mass within the upper-outer
quadrant of the RIGHT breast, at anterior depth, measuring 7 mm.
5. Additional biopsy clip artifact within the posterior RIGHT
breast, corresponding to a benign stereotactic biopsy site.
6. No evidence of malignancy within the LEFT breast.
7. No evidence of malignant lymphadenopathy.
8. Heterogeneous enhancement of the sternum, of uncertain
significance.
9. CT abdomen report of [DATE] describes 2 indeterminate lesions
in the liver, largest measuring 2.2 cm, with abdominal MRI
recommended for further characterization.

RECOMMENDATION:
1. If breast conservation therapy is being considered for the RIGHT
breast, recommend additional MRI guided biopsies for the 3
additional masses in the RIGHT breast as described above. Two masses
are located within the inner RIGHT breast and 1 mass is located
within the upper-outer quadrant of the RIGHT breast. Therefore, if
patient positioning for the biopsies does not allow for all 3 masses
to be biopsied on the same day, the 2 masses within the inner RIGHT
breast could be biopsied on 1 day and the additional mass within the
upper-outer RIGHT breast could be biopsied on a separate day.
2. MRI of the abdomen for further characterization of 2
indeterminate liver lesions described on CT abdomen report of
[DATE]. These liver lesions are concerning given the known RIGHT
breast cancer.
3. Would also consider PET-CT and/or bone scan given the
heterogeneous enhancement of the sternum.

BI-RADS CATEGORY  4: Suspicious.

## 2020-08-11 MED ORDER — GADOBUTROL 1 MMOL/ML IV SOLN
5.0000 mL | Freq: Once | INTRAVENOUS | Status: AC | PRN
  Administered 2020-08-11: 5 mL via INTRAVENOUS

## 2020-08-14 ENCOUNTER — Encounter: Payer: Self-pay | Admitting: Family Medicine

## 2020-08-14 ENCOUNTER — Other Ambulatory Visit: Payer: Self-pay | Admitting: *Deleted

## 2020-08-14 DIAGNOSIS — C50511 Malignant neoplasm of lower-outer quadrant of right female breast: Secondary | ICD-10-CM

## 2020-08-15 NOTE — Progress Notes (Signed)
[place]  [phone] [phone]     ID: Erica Wood DOB: Jan 01, 1966  MR#: [id number]  JYN#:[id number]  Wood Care Team: Eunice Blase, MD as PCP - General (Family Medicine) Prudence Davidson, [place] Bing, MD as Referring Physician (Oncology) Rockwell Germany, RN as Oncology Nurse Navigator Mauro Kaufmann, RN as Oncology Nurse Navigator Rolm Bookbinder, MD as Consulting Physician (General Surgery) Daronte Shostak, Virgie Dad, MD as Consulting Physician (Oncology) Kyung Rudd, MD as Consulting Physician (Radiation Oncology) Bo Merino, MD as Consulting Physician (Rheumatology) Olga Millers, MD as Consulting Physician (Obstetrics and Gynecology) Chauncey Cruel, MD OTHER MD:  CHIEF COMPLAINT: triple positive breast cancer  CURRENT TREATMENT: Neoadjuvant therapy    HISTORY OF CURRENT ILLNESS: Erica Wood herself palpated an abnormality in Erica right breast several years ago.  This had not changed until sometime in June 2021.  Erica Wood was evaluated by Dr. Ouida Sills and underwent right diagnostic mammography with tomography and right breast ultrasonography at [facility] on 07/26/2020 showing: breast density category D; 2.6 cm mass in right breast at 6 o'clock, at site of palpable concern; two focal groups of punctate calcifications posterior to palpable mass; no right axillary adenopathy.  Accordingly on 08/04/2020 Erica Wood proceeded to biopsy of Erica right breast area in question. Erica pathology from this procedure ([id number]) showed: invasive ductal carcinoma, grade 3. Prognostic indicators significant for: estrogen receptor, 95% positive with moderate staining intensity and progesterone receptor, 10% positive with strong staining intensity. Proliferation marker Ki67 at 40%. HER2 equivocal by immunohistochemistry (2+), but positive by fluorescent in situ hybridization with a signals ratio 3.23 and number per cell 6.45.  Biopsy of Erica posterior calcifications  was benign.  Erica Wood also underwent breast MRI on 08/11/2020 showing: breast composition D; 2.6 cm biopsy-proven malignancy in right breast at 6 o'clock, with probable skin involvement noted on previous ultrasound; additional enhancing masses within right breast-- 5 mm in lower-outer, 1.2 cm in upper-inner, 7 mm in upper-outer; no evidence of left breast malignancy or lymphadenopathy; heterogeneous enhancement of sternum, of uncertain significance.  Erica Wood's subsequent history is as detailed below.   INTERVAL HISTORY: Erica Wood was evaluated in [facility] multidisciplinary breast cancer clinic on 08/16/2020 accompanied by her significant other Abbe Amsterdam (of [employer] fame). Her case was also presented at Erica multidisciplinary breast cancer conference on Erica same day. At that time a preliminary plan was proposed: neoadjuvant chemo-immune therapy, completion of staging work-up, surgery, adjuvant radiation, antiestrogens   REVIEW OF SYSTEMS: On Erica provided questionnaire, Erica Wood reports pain in her hands, wearing glasses/contacts, chest pain, poor circulation, abdominal pain, palpable breast lump and associated pain, back pain, joint pain, arthritis, and anxiety. Erica Wood denies unusual headaches, visual changes, nausea, vomiting, stiff neck, dizziness, or gait imbalance. There has been no cough, phlegm production, or pleurisy, no chest pain or pressure, and no change in bowel or bladder habits. Erica Wood denies fever, rash, bleeding, unexplained fatigue or unexplained weight loss. A detailed review of systems was otherwise entirely negative.   PAST MEDICAL HISTORY: Past Medical History:  Diagnosis Date  . Anti-cardiolipin antibody positive   . Anti-cardiolipin antibody syndrome (HCC)   . Anxiety   . Arthralgia   . Atypical chest pain   . Autoimmune disease ([place])   . Chronic fatigue   . DVT (deep venous thrombosis) ([place])   . Lupus ([place])   . Pulmonary embolus ([place])   . Raynaud's syndrome   . Sicca ([place])    . Thrombocytopenia ([place])  PAST SURGICAL HISTORY: Past Surgical History:  Procedure Laterality Date  . ABLATION  12/2018  . ABLATION Right 08/2019  . APPENDECTOMY    . COLON SURGERY     Perforated colon repair after colostomy  . GANGLION CYST EXCISION    . HYSTEROSCOPY WITH D & C N/A 01/13/2014   Procedure: DILATATION AND CURETTAGE /HYSTEROSCOPY with resection ;  Surgeon: Olga Millers, MD;  Location: [facility];  Service: Gynecology;  Laterality: N/A;  . LAPAROTOMY      FAMILY HISTORY: Family History  Problem Relation Age of Onset  . Rheum arthritis Father   . Hemachromatosis Father   . Cancer Sister        bone   as of October 2021 her father isage 4 and her mother age 68.. Erica Wood had one sister (and no brothers). Erica sister was diagnosed with Ewing's sarcoma at age 41, and died from Erica disease after many difficult treatments.    GYNECOLOGIC HISTORY:  No LMP recorded.  Menarche: 34 or 54 years old GX P 0 LMP around 2019 Contraceptive: yes HRT no  Hysterectomy? no BSO? 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 Erica Wood is widowed. Erica Wood lives at home alone with her cat, Mariea Clonts. Erica Wood is not a Ambulance person.    ADVANCED DIRECTIVES: not in place; at Erica 08/16/2020 visit Erica Wood was given Erica appropriate documents to complete and notarized at her discretion.   HEALTH MAINTENANCE: Social History   Tobacco Use  . Smoking status: Former Smoker    Packs/day: 0.50    Years: 5.00    Pack years: 2.50    Types: Cigarettes    Start date: 08/06/2006    Quit date: 08/07/2011    Years since quitting: 9.0  . Smokeless tobacco: Never Used  Vaping Use  . Vaping Use: Never used  Substance Use Topics  . Alcohol use: Yes    Comment: occasionally  . Drug use: No     Colonoscopy: 2002 for perforated bowel  PAP: 03/2020  Bone density: done at Dr. Keane Police office   Allergies  Allergen Reactions  . Beef-Derived Products    . Latex Rash  . Sulfa Antibiotics Rash    Current Outpatient Medications  Medication Sig Dispense Refill  . amphetamine-dextroamphetamine (ADDERALL XR) 20 MG 24 hr capsule Take 1 capsule (20 mg total) by mouth daily. 30 capsule 0  . Ascorbic Acid (VITAMIN C PO) Take 3,000 mg by mouth daily.    Marland Kitchen b complex vitamins capsule Take 1 capsule by mouth daily.    Marland Kitchen CAMILA 0.35 MG tablet Take 1 tablet by mouth daily.  11  . cholecalciferol (VITAMIN D) 1000 UNITS tablet Take 1,000 Units by mouth daily.    . hydroxychloroquine (PLAQUENIL) 200 MG tablet TAKE 1 TABLET BY MOUTH TWICE DAILY MONDAY THROUGH FRIDAY ONLY. 120 tablet 0  . Multiple Vitamins-Minerals (ZINC PO) Take by mouth.    . SPIRONOLACTONE PO Take by mouth as needed.    . tretinoin (RETIN-A) 0.1 % cream tretinoin 0.1 % topical cream    . UNABLE TO FIND Green Vibrance - 1 scoop daily    . warfarin (COUMADIN) 5 MG tablet Take 1 tablet (5 mg total) by mouth daily. 30 tablet 11   No current facility-administered medications for this visit.    OBJECTIVE: White Wood who appears younger than stated age  55:   08/16/20 1253  BP: 128/67  Pulse: 62  Resp:  18  Temp: 98.4 F (36.9 C)  SpO2: 100%     Body mass index is 18.92 kg/m.   Wt Readings from Last 3 Encounters:  08/16/20 110 lb 3.2 oz (50 kg)  07/27/20 111 lb 8 oz (50.6 kg)  02/24/20 118 lb (53.5 kg)      ECOG FS:1 - Symptomatic but completely ambulatory  Ocular: Sclerae unicteric, pupils round and equal Ear-nose-throat: Wearing a mask Lymphatic: No cervical or supraclavicular adenopathy Lungs no rales or rhonchi Heart regular rate and rhythm Abd soft, nontender, positive bowel sounds MSK no focal spinal tenderness, well-defined arthritis changes over both hands Neuro: non-focal, well-oriented, appropriate affect Breasts: Erica right breast is status post recent biopsies.  There is a palpable mass which is movable and does not appear to involve Erica skin or nipple.   Left breast is benign.  Both axillae are benign.   LAB RESULTS:  CMP     Component Value Date/Time   NA 137 07/28/2020 1108   K 4.4 07/28/2020 1108   CL 98 07/28/2020 1108   CO2 28 07/28/2020 1108   GLUCOSE 57 (L) 07/28/2020 1108   BUN 8 07/28/2020 1108   CREATININE 0.82 07/28/2020 1108   CALCIUM 9.5 07/28/2020 1108   PROT 7.3 07/28/2020 1108   ALBUMIN 4.9 07/28/2020 1108   AST 21 07/28/2020 1108   ALT 18 07/28/2020 1108   ALKPHOS 78 07/28/2020 1108   BILITOT 0.4 07/28/2020 1108   GFRNONAA 83 07/28/2020 1108   GFRAA 95 07/28/2020 1108    No results found for: TOTALPROTELP, ALBUMINELP, A1GS, A2GS, BETS, BETA2SER, GAMS, MSPIKE, SPEI  Lab Results  Component Value Date   WBC 5.5 07/28/2020   NEUTROABS 3.0 07/28/2020   HGB 14.3 07/28/2020   HCT 41.4 07/28/2020   MCV 89 07/28/2020   PLT 202 07/28/2020    No results found for: LABCA2  No components found for: WIOXBD532  No results for input(s): INR in Erica last 168 hours.  No results found for: LABCA2  No results found for: DJM426  No results found for: STM196  No results found for: QIW979  No results found for: CA2729  No components found for: HGQUANT  No results found for: CEA1 / No results found for: CEA1   No results found for: AFPTUMOR  No results found for: CHROMOGRNA  No results found for: KPAFRELGTCHN, LAMBDASER, KAPLAMBRATIO (kappa/lambda light chains)  No results found for: HGBA, HGBA2QUANT, HGBFQUANT, HGBSQUAN (Hemoglobinopathy evaluation)   No results found for: LDH  Lab Results  Component Value Date   IRON 88 10/29/2018   IRON 88 10/29/2018   TIBC 241 (L) 10/29/2018   TIBC 241 (L) 10/29/2018   IRONPCTSAT 37 10/29/2018   IRONPCTSAT 37 10/29/2018   (Iron and TIBC)  Lab Results  Component Value Date   FERRITIN 43 10/29/2018   FERRITIN 43 10/29/2018    Urinalysis    Component Value Date/Time   APPEARANCEUR Clear 08/08/2020 1359   GLUCOSEU Negative 08/08/2020 1359    BILIRUBINUR Negative 08/08/2020 1359   PROTEINUR Negative 08/08/2020 1359   NITRITE Negative 08/08/2020 1359   LEUKOCYTESUR Negative 08/08/2020 1359     STUDIES: MR BREAST BILATERAL W WO CONTRAST INC CAD  Result Date: 08/14/2020 CLINICAL DATA:  Known biopsy-proven RIGHT breast cancer (08/08/2020). LABS:  Not applicable EXAM: BILATERAL BREAST MRI WITH AND WITHOUT CONTRAST TECHNIQUE: Multiplanar, multisequence MR images of both breasts were obtained prior to and following Erica intravenous administration of 5 ml of Gadavist Three-dimensional MR images  were rendered by post-processing of Erica original MR data on an independent workstation. Erica three-dimensional MR images were interpreted, and findings are reported in Erica following complete MRI report for this study. Three dimensional images were evaluated at Erica independent interpreting workstation using Erica DynaCAD thin client. COMPARISON:  Previous exams.  No previous breast MRI. FINDINGS: Breast composition: d. Extreme fibroglandular tissue. Background parenchymal enhancement: Moderate. Right breast: Irregular enhancing mass within Erica lower RIGHT breast, 6 o'clock axis, measuring 2.6 x 2.2 x 2.5 cm (craniocaudal by transverse by AP dimensions), with associated biopsy clip artifact, corresponding to Wood's known biopsy-proven malignancy. Erica mass is superficial, underlying Erica skin of Erica lower RIGHT breast. Previous ultrasound showed probable skin involvement. Additional oval circumscribed enhancing mass within Erica lower inner quadrant of Erica RIGHT breast, at posterior depth, 4 o'clock axis region, measuring 5 mm (axial series 9, image 94). Additional irregular enhancing mass within Erica upper inner quadrant of Erica RIGHT breast, at middle depth, measuring 1.2 x 0.7 cm (best seen on sagittal reformatted series 100, images 194 through 197; axial series 12, images 56 through 58). Additional irregular enhancing mass within Erica upper-outer quadrant of Erica RIGHT  breast, at anterior depth, measuring 7 mm (axial series 12, image 61; sagittal reformatted series 100, image 224). Additional biopsy clip artifact within Erica posterior RIGHT breast corresponding to Erica site of benign stereotactic biopsy. Left breast: No suspicious enhancing mass, non-mass enhancement or secondary signs of malignancy. Lymph nodes: No enlarged or morphologically abnormal lymph nodes are identified within either axillary region or within Erica internal mammary chain regions. Ancillary findings:  Heterogeneous enhancement of Erica sternum. IMPRESSION: 1. Known biopsy-proven invasive ductal carcinoma of Erica RIGHT breast, 6 o'clock axis, measuring 2.6 cm, with associated biopsy clip. This mass is superficial, underlying Erica skin of Erica lower RIGHT breast. Previous ultrasound showed probable skin involvement. 2. Additional oval circumscribed enhancing mass within Erica lower inner quadrant of Erica RIGHT breast, at posterior depth, measuring 5 mm. 3. Additional irregular enhancing mass within Erica upper inner quadrant of Erica RIGHT breast, at middle depth, measuring 1.2 cm. 4. Additional irregular enhancing mass within Erica upper-outer quadrant of Erica RIGHT breast, at anterior depth, measuring 7 mm. 5. Additional biopsy clip artifact within Erica posterior RIGHT breast, corresponding to a benign stereotactic biopsy site. 6. No evidence of malignancy within Erica LEFT breast. 7. No evidence of malignant lymphadenopathy. 8. Heterogeneous enhancement of Erica sternum, of uncertain significance. 9. CT abdomen report of 07/14/2020 describes 2 indeterminate lesions in Erica liver, largest measuring 2.2 cm, with abdominal MRI recommended for further characterization. RECOMMENDATION: 1. If breast conservation therapy is being considered for Erica RIGHT breast, recommend additional MRI guided biopsies for Erica 3 additional masses in Erica RIGHT breast as described above. Two masses are located within Erica inner RIGHT breast and 1 mass is  located within Erica upper-outer quadrant of Erica RIGHT breast. Therefore, if Wood positioning for Erica biopsies does not allow for all 3 masses to be biopsied on Erica same day, Erica 2 masses within Erica inner RIGHT breast could be biopsied on 1 day and Erica additional mass within Erica upper-outer RIGHT breast could be biopsied on a separate day. 2. MRI of Erica abdomen for further characterization of 2 indeterminate liver lesions described on CT abdomen report of 07/14/2020. These liver lesions are concerning given Erica known RIGHT breast cancer. 3. Would also consider PET-CT and/or bone scan given Erica heterogeneous enhancement of Erica sternum. BI-RADS CATEGORY  4: Suspicious. Electronically  Signed   By: Franki Cabot M.D.   On: 08/14/2020 11:16   US BREAST LTD UNI RIGHT INC AXILLA  Result Date: 07/26/2020 CLINICAL DATA:  Wood presents for palpable abnormality within Erica right breast. EXAM: DIGITAL DIAGNOSTIC RIGHT MAMMOGRAM WITH CAD AND TOMO ULTRASOUND RIGHT BREAST COMPARISON:  Previous exam(s). ACR Breast Density Category d: Erica breast tissue is extremely dense, which lowers Erica sensitivity of mammography. FINDINGS: Underlying Erica palpable marker within Erica inferior aspect of Erica right breast is an irregular partially obscured mass. Magnification CC and true lateral views of Erica right breast were obtained. There are loosely grouped punctate calcifications identified within Erica middle depth and posterior depth of Erica right breast. Posterior to Erica mass there are two more focal groups of calcifications measuring 4 mm. Mammographic images were processed with CAD. On physical exam, there is a firm palpable mass within Erica inferior anterior right breast. Targeted ultrasound is performed, showing a 2.6 x 2.1 x 1.6 cm irregular hypoechoic mass right breast 6 o'clock position 2 cm from nipple at Erica site of palpable concern. No right axillary adenopathy. IMPRESSION: 1. Suspicious mass right breast 6 o'clock position at Erica  site of palpable concern. 2. There are two focal groups of punctate calcifications within Erica middle depth right breast posterior to Erica palpable mass. RECOMMENDATION: Ultrasound-guided core needle biopsy right breast mass 6 o'clock position. Stereotactic guided core needle biopsy of Erica two punctate groups of calcifications within Erica central right breast posterior to Erica palpable mass for definitive characterization and treatment planning. Given Erica density of Erica breast tissue, recommend bilateral breast MRI after biopsy procedures. I have discussed Erica findings and recommendations with Erica Wood. If applicable, a reminder letter will be sent to Erica Wood regarding Erica next appointment. BI-RADS CATEGORY  5: Highly suggestive of malignancy. Electronically Signed   By: Lovey Newcomer M.D.   On: 07/26/2020 14:55   MM DIAG BREAST TOMO UNI RIGHT  Result Date: 07/26/2020 CLINICAL DATA:  Wood presents for palpable abnormality within Erica right breast. EXAM: DIGITAL DIAGNOSTIC RIGHT MAMMOGRAM WITH CAD AND TOMO ULTRASOUND RIGHT BREAST COMPARISON:  Previous exam(s). ACR Breast Density Category d: Erica breast tissue is extremely dense, which lowers Erica sensitivity of mammography. FINDINGS: Underlying Erica palpable marker within Erica inferior aspect of Erica right breast is an irregular partially obscured mass. Magnification CC and true lateral views of Erica right breast were obtained. There are loosely grouped punctate calcifications identified within Erica middle depth and posterior depth of Erica right breast. Posterior to Erica mass there are two more focal groups of calcifications measuring 4 mm. Mammographic images were processed with CAD. On physical exam, there is a firm palpable mass within Erica inferior anterior right breast. Targeted ultrasound is performed, showing a 2.6 x 2.1 x 1.6 cm irregular hypoechoic mass right breast 6 o'clock position 2 cm from nipple at Erica site of palpable concern. No right axillary  adenopathy. IMPRESSION: 1. Suspicious mass right breast 6 o'clock position at Erica site of palpable concern. 2. There are two focal groups of punctate calcifications within Erica middle depth right breast posterior to Erica palpable mass. RECOMMENDATION: Ultrasound-guided core needle biopsy right breast mass 6 o'clock position. Stereotactic guided core needle biopsy of Erica two punctate groups of calcifications within Erica central right breast posterior to Erica palpable mass for definitive characterization and treatment planning. Given Erica density of Erica breast tissue, recommend bilateral breast MRI after biopsy procedures. I have discussed Erica findings and recommendations with Erica  Wood. If applicable, a reminder letter will be sent to Erica Wood regarding Erica next appointment. BI-RADS CATEGORY  5: Highly suggestive of malignancy. Electronically Signed   By: Lovey Newcomer M.D.   On: 07/26/2020 14:55   MM CLIP PLACEMENT RIGHT  Result Date: 08/04/2020 CLINICAL DATA:  Evaluate post biopsy marker clip placements following ultrasound and stereotactic core needle biopsy of Erica right breast. EXAM: DIAGNOSTIC RIGHT MAMMOGRAM POST ULTRASOUND AND STEREOTACTIC BIOPSY: 2 BIOPSIES PERFORMED. COMPARISON:  Previous exam(s). FINDINGS: Mammographic images were obtained following ultrasound guided biopsy of a 2.6 cm right breast mass. Erica biopsy marking clip is in expected position at Erica site of biopsy. Mammographic images were obtained following stereotactic guided biopsy of subtle calcifications along Erica posterior, inferior and slightly outer aspect. Erica biopsy marking clip is in expected position at Erica site of biopsy. IMPRESSION: Appropriate positioning of Erica ribbon shaped biopsy marking clip at Erica site of biopsy in Erica 6 o'clock position of Erica right breast. Appropriate positioning of Erica coil shaped biopsy marking clip at Erica site of biopsy in Erica in Erica posterior, slightly lateral and inferior aspect of Erica right breast,  adjacent to at least 1 punctate calcification. Final Assessment: Post Procedure Mammograms for Marker Placement Electronically Signed   By: Lajean Manes M.D.   On: 08/04/2020 11:49   XR Hand 2 View Left  Result Date: 07/27/2020 Juxta-articular osteopenia was noted.  Narrowing of second and third MCP joints was noted.  PIP and DIP narrowing was noted.  No intercarpal radiocarpal joint space narrowing was noted.  No erosive changes were noted.  No radiographic progression was noted when compared to Erica x-rays of 2018. Impression: These findings are consistent with inflammatory arthritis and osteoarthritis overlap.  XR Hand 2 View Right  Result Date: 07/27/2020 Juxta-articular osteopenia was noted.  Narrowing of second and third MCP joints was noted.  PIP and DIP narrowing was noted.  No intercarpal or radiocarpal joint space narrowing was noted.  No erosive changes were noted.  Increased narrowing of second and third MCP joints was noted in comparison to Erica films of 2018. Impression: These findings are consistent with inflammatory arthritis and radiographic progression.  MM RT BREAST BX W LOC DEV 1ST LESION IMAGE BX SPEC STEREO GUIDE  Addendum Date: 08/08/2020   ADDENDUM REPORT: 08/08/2020 08:22 ADDENDUM: Pathology revealed GRADE III INVASIVE DUCTAL CARCINOMA of Erica RIGHT breast, 6 o'clock, retroareolar. This was found to be concordant by Dr. Lajean Manes. Pathology revealed BENIGN BREAST TISSUE of Erica RIGHT breast, posterior, lower, slightly outer quadrant. This was found to be concordant by Dr. Lajean Manes. Pathology results were discussed with Erica Wood by telephone. Erica Wood reported doing well after Erica biopsies with tenderness at Erica sites. Post biopsy instructions and care were reviewed and questions were answered. Erica Wood was encouraged to call Erica [facility] for any additional concerns. At Erica request of Dr. Donne Hazel, Erica Wood was referred to [facility] on August 16, 2020 to follow up with him and other team members. Breast MRI strongly recommended for extent. Further recommendations will be guided by these results. Pathology results reported by Stacie Acres RN on 08/08/2020. Electronically Signed   By: Lajean Manes M.D.   On: 08/08/2020 08:22   Result Date: 08/08/2020 CLINICAL DATA:  Wood presents for ultrasound-guided core needle biopsy of a 2.6 cm right breast mass as well as stereotactic core needle biopsy  of right breast calcifications. Two small groups of calcifications in Erica right breast were recommended for biopsy, only 1 of which was faintly visualized during Erica stereotactic core needle procedure. EXAM: RIGHT BREAST ULTRASOUND-GUIDED CORE NEEDLE BIOPSY RIGHT BREAST STEREOTACTIC CORE NEEDLE BIOPSY COMPARISON:  Previous exams. FINDINGS: Ultrasound-guided biopsy: I met with Erica Wood and we discussed Erica procedure of ultrasound-guided biopsy, including benefits and alternatives. We discussed Erica high likelihood of a successful procedure. We discussed Erica risks of Erica procedure, including infection, bleeding, tissue injury, clip migration, and inadequate sampling. Informed written consent was given. Erica usual time-out protocol was performed immediately prior to Erica procedure. Lesion #1: Right breast mass Lesion quadrant: Lower inner quadrant, near 6 o'clock, retroareolar. Using sterile technique and 1% Lidocaine as local anesthetic, under direct ultrasound visualization, a 12 gauge spring-loaded device was used to perform biopsy of Erica 2.6 cm 6 o'clock retroareolar right breast mass using a lateral approach. At Erica conclusion of Erica procedure ribbon shaped tissue marker clip was deployed into Erica biopsy cavity. Lesion #2: Right breast calcifications. Lesion quadrant: Posterior, lower slightly outer quadrant. Erica Wood and I discussed Erica procedure of stereotactic-guided  biopsy including benefits and alternatives. We discussed Erica high likelihood of a successful procedure. We discussed Erica risks of Erica procedure including infection, bleeding, tissue injury, clip migration, and inadequate sampling. Informed written consent was given. Erica usual time out protocol was performed immediately prior to Erica procedure. Using sterile technique and 1% Lidocaine as local anesthetic, under stereotactic guidance, a 9 gauge vacuum assisted device was used to perform core needle biopsy of subtle calcifications along Erica posterior margin of Erica fibroglandular tissue of Erica lower slightly outer right breast was performed using a superior approach. Specimen radiograph was performed showing a few possible subtle calcifications in 1 specimen. Specimens with calcifications are identified for pathology. Wood had a near syncopal episode during Erica procedure and no additional specimens were therefore acquired. Lesion quadrant: Lower outer quadrant At Erica conclusion of Erica procedure, coil shaped tissue marker clip was deployed into Erica biopsy cavity. Follow-up 2-view mammogram was performed and dictated separately. IMPRESSION: Ultrasound-guided biopsy of Erica right breast. No apparent complications. Stereotactic-guided biopsy of Erica right breast. No apparent complications. Electronically Signed: By: Lajean Manes M.D. On: 08/04/2020 11:45   [place] RT BREAST BX W LOC DEV 1ST LESION IMG BX SPEC US GUIDE  Addendum Date: 08/08/2020   ADDENDUM REPORT: 08/08/2020 08:22 ADDENDUM: Pathology revealed GRADE III INVASIVE DUCTAL CARCINOMA of Erica RIGHT breast, 6 o'clock, retroareolar. This was found to be concordant by Dr. Lajean Manes. Pathology revealed BENIGN BREAST TISSUE of Erica RIGHT breast, posterior, lower, slightly outer quadrant. This was found to be concordant by Dr. Lajean Manes. Pathology results were discussed with Erica Wood by telephone. Erica Wood reported doing well after Erica biopsies with tenderness  at Erica sites. Post biopsy instructions and care were reviewed and questions were answered. Erica Wood was encouraged to call Erica [place] for any additional concerns. At Erica request of Dr. Donne Hazel, Erica Wood was referred to Erica [facility] on August 16, 2020 to follow up with him and other team members. Breast MRI strongly recommended for extent. Further recommendations will be guided by these results. Pathology results reported by Stacie Acres RN on 08/08/2020. Electronically Signed   By: Lajean Manes M.D.   On: 08/08/2020 08:22   Result Date: 08/08/2020 CLINICAL DATA:  Wood presents for  ultrasound-guided core needle biopsy of a 2.6 cm right breast mass as well as stereotactic core needle biopsy of right breast calcifications. Two small groups of calcifications in Erica right breast were recommended for biopsy, only 1 of which was faintly visualized during Erica stereotactic core needle procedure. EXAM: RIGHT BREAST ULTRASOUND-GUIDED CORE NEEDLE BIOPSY RIGHT BREAST STEREOTACTIC CORE NEEDLE BIOPSY COMPARISON:  Previous exams. FINDINGS: Ultrasound-guided biopsy: I met with Erica Wood and we discussed Erica procedure of ultrasound-guided biopsy, including benefits and alternatives. We discussed Erica high likelihood of a successful procedure. We discussed Erica risks of Erica procedure, including infection, bleeding, tissue injury, clip migration, and inadequate sampling. Informed written consent was given. Erica usual time-out protocol was performed immediately prior to Erica procedure. Lesion #1: Right breast mass Lesion quadrant: Lower inner quadrant, near 6 o'clock, retroareolar. Using sterile technique and 1% Lidocaine as local anesthetic, under direct ultrasound visualization, a 12 gauge spring-loaded device was used to perform biopsy of Erica 2.6 cm 6 o'clock retroareolar right breast mass using a lateral approach. At Erica  conclusion of Erica procedure ribbon shaped tissue marker clip was deployed into Erica biopsy cavity. Lesion #2: Right breast calcifications. Lesion quadrant: Posterior, lower slightly outer quadrant. Erica Wood and I discussed Erica procedure of stereotactic-guided biopsy including benefits and alternatives. We discussed Erica high likelihood of a successful procedure. We discussed Erica risks of Erica procedure including infection, bleeding, tissue injury, clip migration, and inadequate sampling. Informed written consent was given. Erica usual time out protocol was performed immediately prior to Erica procedure. Using sterile technique and 1% Lidocaine as local anesthetic, under stereotactic guidance, a 9 gauge vacuum assisted device was used to perform core needle biopsy of subtle calcifications along Erica posterior margin of Erica fibroglandular tissue of Erica lower slightly outer right breast was performed using a superior approach. Specimen radiograph was performed showing a few possible subtle calcifications in 1 specimen. Specimens with calcifications are identified for pathology. Wood had a near syncopal episode during Erica procedure and no additional specimens were therefore acquired. Lesion quadrant: Lower outer quadrant At Erica conclusion of Erica procedure, coil shaped tissue marker clip was deployed into Erica biopsy cavity. Follow-up 2-view mammogram was performed and dictated separately. IMPRESSION: Ultrasound-guided biopsy of Erica right breast. No apparent complications. Stereotactic-guided biopsy of Erica right breast. No apparent complications. Electronically Signed: By: Lajean Manes M.D. On: 08/04/2020 11:45     ELIGIBLE FOR AVAILABLE RESEARCH PROTOCOL: AET  ASSESSMENT: 54 y.o. Erica Wood status post right breast lower outer quadrant biopsy 08/04/2020 for a clinical T2 N0, stage IB invasive ductal carcinoma, grade 3, triple positive, with an MIB-1 of 40%  (a) breast MRI 08/14/2020 shows 3 additional right  breast lesions and heterogeneous enhancement of Erica sternum  (b) biopsy of Erica additional right breast masses  (c) CT scan of Erica abdomen and pelvis 07/14/2020 shows 2 indeterminate liver lesions  (d) abdominal MRI 08/24/2020  (e) bone scan  (f) CT scan of Erica chest  (1) neoadjuvant chemotherapy to consist of docetaxel, carboplatin, trastuzumab and Pertuzumab every 21 days x 6 starting 08/31/2020  (2) trastuzumab to continue to complete a year  (a) echo 04/12/2020 shows an ejection fraction in Erica 60-65% range  (3) definitive surgery to follow  (4) adjuvant radiation as appropriate  (5) antiestrogens to start at Erica completion of local treatment  PLAN: I met today with Erica Wood to review her new diagnosis. Specifically we discussed Erica biology of her breast cancer, its diagnosis, staging, treatment  options and prognosis. We first reviewed Erica fact that cancer is not one disease but more than 100 different diseases and that it is important to keep them separate-- otherwise when friends and relatives discuss their own cancer experiences with Erica Wood confusion can result. Similarly we explained that if breast cancer spreads to Erica bone or liver, Erica Wood would not have bone cancer or liver cancer, but breast cancer in Erica bone and breast cancer in Erica liver: one cancer in three places-- not 3 different cancers which otherwise would have to be treated in 3 different ways.  We discussed Erica difference between local and systemic therapy. In terms of loco-regional treatment, lumpectomy plus radiation is equivalent to mastectomy as far as survival is concerned. For this reason, and because Erica cosmetic results are generally superior, we recommend breast conserving surgery.  However, there are 3 other lesions in Erica right breast which need to be biopsied and may affect Erica definitive surgical plan  We also noted that in terms of sequencing of treatments, whether systemic therapy or surgery is done first  does not affect Erica ultimate outcome.  Erica Wood is a good candidate for this approach which will optimize her chances of keeping her breast and also give [place] prognostic information based on her treatment response  We then discussed Erica rationale for systemic therapy. There is some risk that this cancer may have already spread to other parts of her body.  Erica Wood has been partially evaluated with a CT of Erica abdomen and pelvis which raised questions regarding Erica liver.  There is a liver MRI pending.  Erica Wood also had some nonspecific findings on Erica sternum.  Erica Wood will need a bone scan and a CT of Erica chest to complete her staging.  Hopefully we can get that done within Erica next 10 days.  My expectation is that Erica Wood will prove to be early stage  Next we went over Erica options for systemic therapy which are anti-estrogens, anti-HER-2 immunotherapy, and chemotherapy. Wen meets criteria for all 3 and today we discussed why this is favorable for her.  Erica chemotherapy plan is for standard carboplatin, docetaxel, trastuzumab and Pertuzumab every 21 days x 6 to be followed by trastuzumab possibly with Pertuzumab to complete a year.  Erica Wood has already had an echo and this does not need to be repeated at this point.  Erica Wood does need a port and chemotherapy teaching.    It is important to keep in mind that Erica Wood has a history of antiphospholipid syndrome and is therefore at increased risk of clotting.  We do hope that Erica chemotherapy may actually benefit her autoimmune disease.  We are hoping to start her treatment 08/31/2020  Jakelyn has a good understanding of Erica overall plan. Erica Wood agrees with it. Erica Wood knows Erica goal of treatment in her case is cure. Erica Wood will call with any problems that may develop before her next visit here.   Virgie Dad. Cheryal Salas, MD 08/16/2020 2:09 PM Medical Oncology and Hematology [facility] [place], [place] [address] Tel. [phone]    Fax. [phone]   This document  serves as a record of services personally performed by Lurline Del, MD. It was created on his behalf by Wilburn Mylar, a trained medical scribe. Erica creation of this record is based on Erica scribe's personal observations and Erica provider's statements to them.   I, Lurline Del MD, have reviewed Erica above documentation for accuracy and completeness, and I agree with Erica  above.    *Total Encounter Time as defined by Erica Centers for Medicare and Medicaid Services includes, in addition to Erica face-to-face time of a Wood visit (documented in Erica note above) non-face-to-face time: obtaining and reviewing outside history, ordering and reviewing medications, tests or procedures, care coordination (communications with other health care professionals or caregivers) and documentation in Erica medical record.

## 2020-08-16 ENCOUNTER — Other Ambulatory Visit: Payer: Self-pay

## 2020-08-16 ENCOUNTER — Other Ambulatory Visit: Payer: Self-pay | Admitting: General Surgery

## 2020-08-16 ENCOUNTER — Encounter: Payer: Self-pay | Admitting: *Deleted

## 2020-08-16 ENCOUNTER — Encounter: Payer: Self-pay | Admitting: Physical Therapy

## 2020-08-16 ENCOUNTER — Ambulatory Visit: Payer: 59 | Attending: General Surgery | Admitting: Physical Therapy

## 2020-08-16 ENCOUNTER — Encounter: Payer: Self-pay | Admitting: General Practice

## 2020-08-16 ENCOUNTER — Inpatient Hospital Stay: Payer: 59 | Attending: Oncology | Admitting: Oncology

## 2020-08-16 ENCOUNTER — Ambulatory Visit
Admission: RE | Admit: 2020-08-16 | Discharge: 2020-08-16 | Disposition: A | Discharge status: Home / Self Care | Payer: 59 | Source: Ambulatory Visit | Attending: Radiation Oncology | Admitting: Radiation Oncology

## 2020-08-16 ENCOUNTER — Encounter: Payer: Self-pay | Admitting: Family Medicine

## 2020-08-16 ENCOUNTER — Other Ambulatory Visit: Payer: 59

## 2020-08-16 ENCOUNTER — Other Ambulatory Visit: Payer: Self-pay | Admitting: *Deleted

## 2020-08-16 ENCOUNTER — Other Ambulatory Visit: Payer: Self-pay | Admitting: Family Medicine

## 2020-08-16 VITALS — BP 128/67 | HR 62 | Temp 98.4°F | Resp 18 | Ht 64.0 in | Wt 110.2 lb

## 2020-08-16 DIAGNOSIS — R070 Pain in throat: Secondary | ICD-10-CM | POA: Diagnosis not present

## 2020-08-16 DIAGNOSIS — Z5112 Encounter for antineoplastic immunotherapy: Secondary | ICD-10-CM | POA: Diagnosis not present

## 2020-08-16 DIAGNOSIS — L930 Discoid lupus erythematosus: Secondary | ICD-10-CM

## 2020-08-16 DIAGNOSIS — C50511 Malignant neoplasm of lower-outer quadrant of right female breast: Secondary | ICD-10-CM

## 2020-08-16 DIAGNOSIS — Z170 Estrogen receptor positive status [ER+]: Secondary | ICD-10-CM | POA: Diagnosis not present

## 2020-08-16 DIAGNOSIS — Z87891 Personal history of nicotine dependence: Secondary | ICD-10-CM

## 2020-08-16 DIAGNOSIS — Z808 Family history of malignant neoplasm of other organs or systems: Secondary | ICD-10-CM

## 2020-08-16 DIAGNOSIS — R293 Abnormal posture: Secondary | ICD-10-CM

## 2020-08-16 DIAGNOSIS — M359 Systemic involvement of connective tissue, unspecified: Secondary | ICD-10-CM

## 2020-08-16 DIAGNOSIS — D6861 Antiphospholipid syndrome: Secondary | ICD-10-CM

## 2020-08-16 DIAGNOSIS — I2782 Chronic pulmonary embolism: Secondary | ICD-10-CM

## 2020-08-16 DIAGNOSIS — R760 Raised antibody titer: Secondary | ICD-10-CM

## 2020-08-16 DIAGNOSIS — K769 Liver disease, unspecified: Secondary | ICD-10-CM | POA: Diagnosis not present

## 2020-08-16 DIAGNOSIS — K5900 Constipation, unspecified: Secondary | ICD-10-CM | POA: Diagnosis not present

## 2020-08-16 DIAGNOSIS — N649 Disorder of breast, unspecified: Secondary | ICD-10-CM

## 2020-08-16 DIAGNOSIS — I7300 Raynaud's syndrome without gangrene: Secondary | ICD-10-CM

## 2020-08-16 DIAGNOSIS — Z5111 Encounter for antineoplastic chemotherapy: Secondary | ICD-10-CM | POA: Diagnosis present

## 2020-08-16 DIAGNOSIS — C787 Secondary malignant neoplasm of liver and intrahepatic bile duct: Secondary | ICD-10-CM | POA: Insufficient documentation

## 2020-08-16 DIAGNOSIS — Z86718 Personal history of other venous thrombosis and embolism: Secondary | ICD-10-CM

## 2020-08-16 DIAGNOSIS — Z86711 Personal history of pulmonary embolism: Secondary | ICD-10-CM

## 2020-08-16 MED ORDER — ENOXAPARIN SODIUM 30 MG/0.3ML ~~LOC~~ SOLN: 30.0000 mg | SUBCUTANEOUS | 1 refills | Status: DC

## 2020-08-16 MED ORDER — PROCHLORPERAZINE MALEATE 10 MG PO TABS
5.0000 mg | ORAL_TABLET | Freq: Three times a day (TID) | ORAL | 1 refills | Status: DC
Start: 2020-08-16 — End: 2021-01-03

## 2020-08-16 MED ORDER — LIDOCAINE-PRILOCAINE 2.5-2.5 % EX CREA: TOPICAL_CREAM | CUTANEOUS | 3 refills | Status: DC

## 2020-08-16 MED ORDER — DEXAMETHASONE 4 MG PO TABS: 8.0000 mg | ORAL_TABLET | Freq: Two times a day (BID) | ORAL | 1 refills | Status: DC

## 2020-08-16 MED ORDER — LORAZEPAM 0.5 MG PO TABS: 0.5000 mg | ORAL_TABLET | Freq: Every evening | ORAL | 0 refills | Status: DC | PRN
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
## 2020-08-16 NOTE — Patient Instructions (Signed)

## 2020-08-16 NOTE — Progress Notes (Signed)
START ON PATHWAY REGIMEN - Breast     A cycle is every 21 days:     Pertuzumab      Pertuzumab      Trastuzumab-xxxx      Trastuzumab-xxxx      Carboplatin      Docetaxel   **Always confirm dose/schedule in your pharmacy ordering system**  Patient Characteristics: Preoperative or Nonsurgical Candidate (Clinical Staging), Neoadjuvant Therapy followed by Surgery, Invasive Disease, Chemotherapy, HER2 Positive, ER Positive Therapeutic Status: Preoperative or Nonsurgical Candidate (Clinical Staging) AJCC M Category: cM0 AJCC Grade: G3 Breast Surgical Plan: Neoadjuvant Therapy followed by Surgery ER Status: Positive (+) AJCC 8 Stage Grouping: IB HER2 Status: Positive (+) AJCC T Category: cT2 AJCC N Category: cN0 PR Status: Positive (+) Intent of Therapy: Curative Intent, Discussed with Patient 

## 2020-08-16 NOTE — Progress Notes (Unsigned)
ONCBCN DISTRESS SCREENING 08/16/20  Screening Type Initial Screening  Distress experienced in past week (1-10) 5  Emotional problem type Nervousness/Anxiety  Physical Problem type Pain;constipation/diarrhea/swollen arms/legs  Referral to support programs   Other Concern about money   I spent time with [facility] and her SO.  They were relieved to hear more about the plan of care and prognosis and had been imagining a much more severe scenario. They have very good support and we discussed how to best utilize that support and help direct some of the anxiety that other people feel around her diagnosis. She is concerned about how chemo may change her appearance.  Her SO shared some of his feelings of anger, helplessness and anxiety.  They had been particularly worried about money and how they would manage.  The distress about money has decreased now that they know more about what to expect.  Lacee is very concerned about how to tell her mother about her diagnosis particularly since her sister died of bone cancer at the age of 75.  We talked about some ways to share this news and how to take care of herself while she shares as well as how to provide resources for her mother in case her mother's grief over her sister were to be triggered.   I also provided them with an advance directive and reviewed the forms with them so that they could begin to fill them out.   [address] [place], [place] Pager [phone]

## 2020-08-16 NOTE — Progress Notes (Signed)
Radiation Oncology         [phone] ________________________________  Name: Erica Wood        MRN: [id number]  Date of Service: 08/16/2020 DOB: [birth year]  [id number], Legrand Como, MD  Rolm Bookbinder, MD     REFERRING PHYSICIAN: Rolm Bookbinder, MD   DIAGNOSIS: The encounter diagnosis was Malignant neoplasm of lower-outer quadrant of right breast of female, estrogen receptor positive ([place]).   HISTORY OF PRESENT ILLNESS: Erica Wood is a 54 y.o. female seen in the multidisciplinary breast clinic for a new diagnosis of right breast cancer. The patient was noted to have a palpable mass in the right breast, diagnostic imaging revealed a mass at 6:00 measuring 2.6 x 2.1 x 1.6 cm. Calcifications posterior inferior and lateral to this were also identified. Her axilla was negative for adenopathy. She underwent an MRI that showed the mass measuring 2.6 x 2.5 x 2.2 cm, there were several other findings including an upper inner quadrant lesion measuring 1.2 cm, lower inner quadrant lesion measuring 5 mm, and upper outer quadrant measuring 7 mm. A CT scan had also been performed of her abdomen and pelvis due to given right lower quadrant pain, and this revealed 2 indeterminate low level attenuation lesions measuring 2.2 cm in largest dimension. An abdominal MRI scan has been ordered and will be performed on 08/24/2020. Comes today to discuss options of treatment for her breast cancer.   PREVIOUS RADIATION THERAPY: No   PAST MEDICAL HISTORY:  Past Medical History:  Diagnosis Date  . Anti-cardiolipin antibody positive   . Anti-cardiolipin antibody syndrome (HCC)   . Anxiety   . Arthralgia   . Atypical chest pain   . Autoimmune disease ([place])   . Chronic fatigue   . DVT (deep venous thrombosis) ([place])   . Pulmonary embolus ([place])   . Raynaud's syndrome   . Sicca ([place])   . Thrombocytopenia ([place])        PAST SURGICAL HISTORY: Past Surgical History:  Procedure Laterality  Date  . ABLATION  12/2018  . ABLATION Right 08/2019  . APPENDECTOMY    . COLON SURGERY     Perforated colon repair after colostomy  . GANGLION CYST EXCISION    . HYSTEROSCOPY WITH D & C N/A 01/13/2014   Procedure: DILATATION AND CURETTAGE /HYSTEROSCOPY with resection ;  Surgeon: Olga Millers, MD;  Location: [facility];  Service: Gynecology;  Laterality: N/A;  . LAPAROTOMY       FAMILY HISTORY:  Family History  Problem Relation Age of Onset  . Rheum arthritis Father   . Hemachromatosis Father   . Cancer Sister        bone      SOCIAL HISTORY:  reports that she quit smoking about 9 years ago. Her smoking use included cigarettes. She started smoking about 14 years ago. She has a 2.50 pack-year smoking history. She has never used smokeless tobacco. She reports current alcohol use. She reports that she does not use drugs. The patient is widowed. She works for [employer].   ALLERGIES: Beef-derived products, Latex, and Sulfa antibiotics   MEDICATIONS:  Current Outpatient Medications  Medication Sig Dispense Refill  . amphetamine-dextroamphetamine (ADDERALL XR) 20 MG 24 hr capsule Take 1 capsule (20 mg total) by mouth daily. 30 capsule 0  . CAMILA 0.35 MG tablet Take 1 tablet by mouth daily.  11  . cholecalciferol (VITAMIN D) 1000 UNITS tablet Take 1,000 Units by mouth daily.    . hydroxychloroquine (PLAQUENIL) 200  MG tablet TAKE 1 TABLET BY MOUTH TWICE DAILY MONDAY THROUGH FRIDAY ONLY. 120 tablet 0  . SPIRONOLACTONE PO Take by mouth as needed.    . tretinoin (RETIN-A) 0.1 % cream tretinoin 0.1 % topical cream    . warfarin (COUMADIN) 5 MG tablet Take 1 tablet (5 mg total) by mouth daily. 30 tablet 11   No current facility-administered medications for this encounter.     REVIEW OF SYSTEMS: On review of systems, the patient reports that she is doing well overall. She does have significant joint pains but has decided to avoid methotrexate or imuran. She can palpate her breast tumor but no  specific complaints of bleeding or discharge is noted.    PHYSICAL EXAM:  Wt Readings from Last 3 Encounters:  07/27/20 111 lb 8 oz (50.6 kg)  02/24/20 118 lb (53.5 kg)  01/28/20 117 lb (53.1 kg)   Temp Readings from Last 3 Encounters:  01/13/14 97.9 F (36.6 C)  12/29/12 98.5 F (36.9 C) (Oral)  08/06/12 98.2 F (36.8 C) (Oral)   BP Readings from Last 3 Encounters:  07/27/20 120/73  02/24/20 116/76  01/28/20 114/70   Pulse Readings from Last 3 Encounters:  07/27/20 68  02/24/20 69  01/28/20 70    In general this is a well appearing caucasian female in no acute distress. She's alert and oriented x4 and appropriate throughout the examination. Cardiopulmonary assessment is negative for acute distress and she exhibits normal effort. Bilateral breast exam is deferred.    ECOG = 0  0 - Asymptomatic (Fully active, able to carry on all predisease activities without restriction)  1 - Symptomatic but completely ambulatory (Restricted in physically strenuous activity but ambulatory and able to carry out work of a light or sedentary nature. For example, light housework, office work)  2 - Symptomatic, <50% in bed during the day (Ambulatory and capable of all self care but unable to carry out any work activities. Up and about more than 50% of waking hours)  3 - Symptomatic, >50% in bed, but not bedbound (Capable of only limited self-care, confined to bed or chair 50% or more of waking hours)  4 - Bedbound (Completely disabled. Cannot carry on any self-care. Totally confined to bed or chair)  5 - Death   Eustace Pen MM, Creech RH, Tormey DC, et al. [phone]). "Toxicity and response criteria of the [facility] Group". [place] Oncol. 5 (6): 649-55    LABORATORY DATA:  Lab Results  Component Value Date   WBC 5.5 07/28/2020   HGB 14.3 07/28/2020   HCT 41.4 07/28/2020   MCV 89 07/28/2020   PLT 202 07/28/2020   Lab Results  Component Value Date   NA 137 07/28/2020    K 4.4 07/28/2020   CL 98 07/28/2020   CO2 28 07/28/2020   Lab Results  Component Value Date   ALT 18 07/28/2020   AST 21 07/28/2020   ALKPHOS 78 07/28/2020   BILITOT 0.4 07/28/2020      RADIOGRAPHY: MR BREAST BILATERAL W WO CONTRAST INC CAD  Result Date: 08/14/2020 CLINICAL DATA:  Known biopsy-proven RIGHT breast cancer (08/08/2020). LABS:  Not applicable EXAM: BILATERAL BREAST MRI WITH AND WITHOUT CONTRAST TECHNIQUE: Multiplanar, multisequence MR images of both breasts were obtained prior to and following the intravenous administration of 5 ml of Gadavist Three-dimensional MR images were rendered by post-processing of the original MR data on an independent workstation. The three-dimensional MR images were interpreted, and findings are reported  in the following complete MRI report for this study. Three dimensional images were evaluated at the independent interpreting workstation using the DynaCAD thin client. COMPARISON:  Previous exams.  No previous breast MRI. FINDINGS: Breast composition: d. Extreme fibroglandular tissue. Background parenchymal enhancement: Moderate. Right breast: Irregular enhancing mass within the lower RIGHT breast, 6 o'clock axis, measuring 2.6 x 2.2 x 2.5 cm (craniocaudal by transverse by AP dimensions), with associated biopsy clip artifact, corresponding to patient's known biopsy-proven malignancy. The mass is superficial, underlying the skin of the lower RIGHT breast. Previous ultrasound showed probable skin involvement. Additional oval circumscribed enhancing mass within the lower inner quadrant of the RIGHT breast, at posterior depth, 4 o'clock axis region, measuring 5 mm (axial series 9, image 94). Additional irregular enhancing mass within the upper inner quadrant of the RIGHT breast, at middle depth, measuring 1.2 x 0.7 cm (best seen on sagittal reformatted series 100, images 194 through 197; axial series 12, images 56 through 58). Additional irregular enhancing mass  within the upper-outer quadrant of the RIGHT breast, at anterior depth, measuring 7 mm (axial series 12, image 61; sagittal reformatted series 100, image 224). Additional biopsy clip artifact within the posterior RIGHT breast corresponding to the site of benign stereotactic biopsy. Left breast: No suspicious enhancing mass, non-mass enhancement or secondary signs of malignancy. Lymph nodes: No enlarged or morphologically abnormal lymph nodes are identified within either axillary region or within the internal mammary chain regions. Ancillary findings:  Heterogeneous enhancement of the sternum. IMPRESSION: 1. Known biopsy-proven invasive ductal carcinoma of the RIGHT breast, 6 o'clock axis, measuring 2.6 cm, with associated biopsy clip. This mass is superficial, underlying the skin of the lower RIGHT breast. Previous ultrasound showed probable skin involvement. 2. Additional oval circumscribed enhancing mass within the lower inner quadrant of the RIGHT breast, at posterior depth, measuring 5 mm. 3. Additional irregular enhancing mass within the upper inner quadrant of the RIGHT breast, at middle depth, measuring 1.2 cm. 4. Additional irregular enhancing mass within the upper-outer quadrant of the RIGHT breast, at anterior depth, measuring 7 mm. 5. Additional biopsy clip artifact within the posterior RIGHT breast, corresponding to a benign stereotactic biopsy site. 6. No evidence of malignancy within the LEFT breast. 7. No evidence of malignant lymphadenopathy. 8. Heterogeneous enhancement of the sternum, of uncertain significance. 9. CT abdomen report of 07/14/2020 describes 2 indeterminate lesions in the liver, largest measuring 2.2 cm, with abdominal MRI recommended for further characterization. RECOMMENDATION: 1. If breast conservation therapy is being considered for the RIGHT breast, recommend additional MRI guided biopsies for the 3 additional masses in the RIGHT breast as described above. Two masses are located  within the inner RIGHT breast and 1 mass is located within the upper-outer quadrant of the RIGHT breast. Therefore, if patient positioning for the biopsies does not allow for all 3 masses to be biopsied on the same day, the 2 masses within the inner RIGHT breast could be biopsied on 1 day and the additional mass within the upper-outer RIGHT breast could be biopsied on a separate day. 2. MRI of the abdomen for further characterization of 2 indeterminate liver lesions described on CT abdomen report of 07/14/2020. These liver lesions are concerning given the known RIGHT breast cancer. 3. Would also consider PET-CT and/or bone scan given the heterogeneous enhancement of the sternum. BI-RADS CATEGORY  4: Suspicious. Electronically Signed   By: Franki Cabot M.D.   On: 08/14/2020 11:16   US BREAST LTD UNI RIGHT INC AXILLA  Result Date: 07/26/2020 CLINICAL DATA:  Patient presents for palpable abnormality within the right breast. EXAM: DIGITAL DIAGNOSTIC RIGHT MAMMOGRAM WITH CAD AND TOMO ULTRASOUND RIGHT BREAST COMPARISON:  Previous exam(s). ACR Breast Density Category d: The breast tissue is extremely dense, which lowers the sensitivity of mammography. FINDINGS: Underlying the palpable marker within the inferior aspect of the right breast is an irregular partially obscured mass. Magnification CC and true lateral views of the right breast were obtained. There are loosely grouped punctate calcifications identified within the middle depth and posterior depth of the right breast. Posterior to the mass there are two more focal groups of calcifications measuring 4 mm. Mammographic images were processed with CAD. On physical exam, there is a firm palpable mass within the inferior anterior right breast. Targeted ultrasound is performed, showing a 2.6 x 2.1 x 1.6 cm irregular hypoechoic mass right breast 6 o'clock position 2 cm from nipple at the site of palpable concern. No right axillary adenopathy. IMPRESSION: 1. Suspicious  mass right breast 6 o'clock position at the site of palpable concern. 2. There are two focal groups of punctate calcifications within the middle depth right breast posterior to the palpable mass. RECOMMENDATION: Ultrasound-guided core needle biopsy right breast mass 6 o'clock position. Stereotactic guided core needle biopsy of the two punctate groups of calcifications within the central right breast posterior to the palpable mass for definitive characterization and treatment planning. Given the density of the breast tissue, recommend bilateral breast MRI after biopsy procedures. I have discussed the findings and recommendations with the patient. If applicable, a reminder letter will be sent to the patient regarding the next appointment. BI-RADS CATEGORY  5: Highly suggestive of malignancy. Electronically Signed   By: Lovey Newcomer M.D.   On: 07/26/2020 14:55   MM DIAG BREAST TOMO UNI RIGHT  Result Date: 07/26/2020 CLINICAL DATA:  Patient presents for palpable abnormality within the right breast. EXAM: DIGITAL DIAGNOSTIC RIGHT MAMMOGRAM WITH CAD AND TOMO ULTRASOUND RIGHT BREAST COMPARISON:  Previous exam(s). ACR Breast Density Category d: The breast tissue is extremely dense, which lowers the sensitivity of mammography. FINDINGS: Underlying the palpable marker within the inferior aspect of the right breast is an irregular partially obscured mass. Magnification CC and true lateral views of the right breast were obtained. There are loosely grouped punctate calcifications identified within the middle depth and posterior depth of the right breast. Posterior to the mass there are two more focal groups of calcifications measuring 4 mm. Mammographic images were processed with CAD. On physical exam, there is a firm palpable mass within the inferior anterior right breast. Targeted ultrasound is performed, showing a 2.6 x 2.1 x 1.6 cm irregular hypoechoic mass right breast 6 o'clock position 2 cm from nipple at the site of  palpable concern. No right axillary adenopathy. IMPRESSION: 1. Suspicious mass right breast 6 o'clock position at the site of palpable concern. 2. There are two focal groups of punctate calcifications within the middle depth right breast posterior to the palpable mass. RECOMMENDATION: Ultrasound-guided core needle biopsy right breast mass 6 o'clock position. Stereotactic guided core needle biopsy of the two punctate groups of calcifications within the central right breast posterior to the palpable mass for definitive characterization and treatment planning. Given the density of the breast tissue, recommend bilateral breast MRI after biopsy procedures. I have discussed the findings and recommendations with the patient. If applicable, a reminder letter will be sent to the patient regarding the next appointment. BI-RADS CATEGORY  5: Highly suggestive of  malignancy. Electronically Signed   By: Lovey Newcomer M.D.   On: 07/26/2020 14:55   MM CLIP PLACEMENT RIGHT  Result Date: 08/04/2020 CLINICAL DATA:  Evaluate post biopsy marker clip placements following ultrasound and stereotactic core needle biopsy of the right breast. EXAM: DIAGNOSTIC RIGHT MAMMOGRAM POST ULTRASOUND AND STEREOTACTIC BIOPSY: 2 BIOPSIES PERFORMED. COMPARISON:  Previous exam(s). FINDINGS: Mammographic images were obtained following ultrasound guided biopsy of a 2.6 cm right breast mass. The biopsy marking clip is in expected position at the site of biopsy. Mammographic images were obtained following stereotactic guided biopsy of subtle calcifications along the posterior, inferior and slightly outer aspect. The biopsy marking clip is in expected position at the site of biopsy. IMPRESSION: Appropriate positioning of the ribbon shaped biopsy marking clip at the site of biopsy in the 6 o'clock position of the right breast. Appropriate positioning of the coil shaped biopsy marking clip at the site of biopsy in the in the posterior, slightly lateral and  inferior aspect of the right breast, adjacent to at least 1 punctate calcification. Final Assessment: Post Procedure Mammograms for Marker Placement Electronically Signed   By: Lajean Manes M.D.   On: 08/04/2020 11:49   XR Hand 2 View Left  Result Date: 07/27/2020 Juxta-articular osteopenia was noted.  Narrowing of second and third MCP joints was noted.  PIP and DIP narrowing was noted.  No intercarpal radiocarpal joint space narrowing was noted.  No erosive changes were noted.  No radiographic progression was noted when compared to the x-rays of 2018. Impression: These findings are consistent with inflammatory arthritis and osteoarthritis overlap.  XR Hand 2 View Right  Result Date: 07/27/2020 Juxta-articular osteopenia was noted.  Narrowing of second and third MCP joints was noted.  PIP and DIP narrowing was noted.  No intercarpal or radiocarpal joint space narrowing was noted.  No erosive changes were noted.  Increased narrowing of second and third MCP joints was noted in comparison to the films of 2018. Impression: These findings are consistent with inflammatory arthritis and radiographic progression.  MM RT BREAST BX W LOC DEV 1ST LESION IMAGE BX SPEC STEREO GUIDE  Addendum Date: 08/08/2020   ADDENDUM REPORT: 08/08/2020 08:22 ADDENDUM: Pathology revealed GRADE III INVASIVE DUCTAL CARCINOMA of the RIGHT breast, 6 o'clock, retroareolar. This was found to be concordant by Dr. Lajean Manes. Pathology revealed BENIGN BREAST TISSUE of the RIGHT breast, posterior, lower, slightly outer quadrant. This was found to be concordant by Dr. Lajean Manes. Pathology results were discussed with the patient by telephone. The patient reported doing well after the biopsies with tenderness at the sites. Post biopsy instructions and care were reviewed and questions were answered. The patient was encouraged to call [facility] for any additional concerns. At the request of Dr. Donne Hazel, the  patient was referred to [facility] on August 16, 2020 to follow up with him and other team members. Breast MRI strongly recommended for extent. Further recommendations will be guided by these results. Pathology results reported by Stacie Acres RN on 08/08/2020. Electronically Signed   By: Lajean Manes M.D.   On: 08/08/2020 08:22   Result Date: 08/08/2020 CLINICAL DATA:  Patient presents for ultrasound-guided core needle biopsy of a 2.6 cm right breast mass as well as stereotactic core needle biopsy of right breast calcifications. Two small groups of calcifications in the right breast were recommended for biopsy, only 1 of which was faintly  visualized during the stereotactic core needle procedure. EXAM: RIGHT BREAST ULTRASOUND-GUIDED CORE NEEDLE BIOPSY RIGHT BREAST STEREOTACTIC CORE NEEDLE BIOPSY COMPARISON:  Previous exams. FINDINGS: Ultrasound-guided biopsy: I met with the patient and we discussed the procedure of ultrasound-guided biopsy, including benefits and alternatives. We discussed the high likelihood of a successful procedure. We discussed the risks of the procedure, including infection, bleeding, tissue injury, clip migration, and inadequate sampling. Informed written consent was given. The usual time-out protocol was performed immediately prior to the procedure. Lesion #1: Right breast mass Lesion quadrant: Lower inner quadrant, near 6 o'clock, retroareolar. Using sterile technique and 1% Lidocaine as local anesthetic, under direct ultrasound visualization, a 12 gauge spring-loaded device was used to perform biopsy of the 2.6 cm 6 o'clock retroareolar right breast mass using a lateral approach. At the conclusion of the procedure ribbon shaped tissue marker clip was deployed into the biopsy cavity. Lesion #2: Right breast calcifications. Lesion quadrant: Posterior, lower slightly outer quadrant. The patient and I discussed the  procedure of stereotactic-guided biopsy including benefits and alternatives. We discussed the high likelihood of a successful procedure. We discussed the risks of the procedure including infection, bleeding, tissue injury, clip migration, and inadequate sampling. Informed written consent was given. The usual time out protocol was performed immediately prior to the procedure. Using sterile technique and 1% Lidocaine as local anesthetic, under stereotactic guidance, a 9 gauge vacuum assisted device was used to perform core needle biopsy of subtle calcifications along the posterior margin of the fibroglandular tissue of the lower slightly outer right breast was performed using a superior approach. Specimen radiograph was performed showing a few possible subtle calcifications in 1 specimen. Specimens with calcifications are identified for pathology. Patient had a near syncopal episode during the procedure and no additional specimens were therefore acquired. Lesion quadrant: Lower outer quadrant At the conclusion of the procedure, coil shaped tissue marker clip was deployed into the biopsy cavity. Follow-up 2-view mammogram was performed and dictated separately. IMPRESSION: Ultrasound-guided biopsy of the right breast. No apparent complications. Stereotactic-guided biopsy of the right breast. No apparent complications. Electronically Signed: By: Lajean Manes M.D. On: 08/04/2020 11:45   [place] RT BREAST BX W LOC DEV 1ST LESION IMG BX SPEC US GUIDE  Addendum Date: 08/08/2020   ADDENDUM REPORT: 08/08/2020 08:22 ADDENDUM: Pathology revealed GRADE III INVASIVE DUCTAL CARCINOMA of the RIGHT breast, 6 o'clock, retroareolar. This was found to be concordant by Dr. Lajean Manes. Pathology revealed BENIGN BREAST TISSUE of the RIGHT breast, posterior, lower, slightly outer quadrant. This was found to be concordant by Dr. Lajean Manes. Pathology results were discussed with the patient by telephone. The patient reported doing well  after the biopsies with tenderness at the sites. Post biopsy instructions and care were reviewed and questions were answered. The patient was encouraged to call [facility] for any additional concerns. At the request of Dr. Donne Hazel, the patient was referred to [facility] at [facility] on August 16, 2020 to follow up with him and other team members. Breast MRI strongly recommended for extent. Further recommendations will be guided by these results. Pathology results reported by Stacie Acres RN on 08/08/2020. Electronically Signed   By: Lajean Manes M.D.   On: 08/08/2020 08:22   Result Date: 08/08/2020 CLINICAL DATA:  Patient presents for ultrasound-guided core needle biopsy of a 2.6 cm right breast mass as well as stereotactic core needle biopsy of right breast calcifications. Two  small groups of calcifications in the right breast were recommended for biopsy, only 1 of which was faintly visualized during the stereotactic core needle procedure. EXAM: RIGHT BREAST ULTRASOUND-GUIDED CORE NEEDLE BIOPSY RIGHT BREAST STEREOTACTIC CORE NEEDLE BIOPSY COMPARISON:  Previous exams. FINDINGS: Ultrasound-guided biopsy: I met with the patient and we discussed the procedure of ultrasound-guided biopsy, including benefits and alternatives. We discussed the high likelihood of a successful procedure. We discussed the risks of the procedure, including infection, bleeding, tissue injury, clip migration, and inadequate sampling. Informed written consent was given. The usual time-out protocol was performed immediately prior to the procedure. Lesion #1: Right breast mass Lesion quadrant: Lower inner quadrant, near 6 o'clock, retroareolar. Using sterile technique and 1% Lidocaine as local anesthetic, under direct ultrasound visualization, a 12 gauge spring-loaded device was used to perform biopsy of the 2.6 cm 6 o'clock retroareolar right breast  mass using a lateral approach. At the conclusion of the procedure ribbon shaped tissue marker clip was deployed into the biopsy cavity. Lesion #2: Right breast calcifications. Lesion quadrant: Posterior, lower slightly outer quadrant. The patient and I discussed the procedure of stereotactic-guided biopsy including benefits and alternatives. We discussed the high likelihood of a successful procedure. We discussed the risks of the procedure including infection, bleeding, tissue injury, clip migration, and inadequate sampling. Informed written consent was given. The usual time out protocol was performed immediately prior to the procedure. Using sterile technique and 1% Lidocaine as local anesthetic, under stereotactic guidance, a 9 gauge vacuum assisted device was used to perform core needle biopsy of subtle calcifications along the posterior margin of the fibroglandular tissue of the lower slightly outer right breast was performed using a superior approach. Specimen radiograph was performed showing a few possible subtle calcifications in 1 specimen. Specimens with calcifications are identified for pathology. Patient had a near syncopal episode during the procedure and no additional specimens were therefore acquired. Lesion quadrant: Lower outer quadrant At the conclusion of the procedure, coil shaped tissue marker clip was deployed into the biopsy cavity. Follow-up 2-view mammogram was performed and dictated separately. IMPRESSION: Ultrasound-guided biopsy of the right breast. No apparent complications. Stereotactic-guided biopsy of the right breast. No apparent complications. Electronically Signed: By: Lajean Manes M.D. On: 08/04/2020 11:45       IMPRESSION/PLAN: 1. Stage IB, cT2N0M0 grade 3, triple positive invasive ductal carcinoma of the right breast. Dr. Lisbeth Renshaw discusses the pathology findings and reviews the nature of triple positive right breast disease. The consensus from the breast conference includes  proceeding with additional biopsies with the MRI findings. If her biopsies were negative, she would possibly be a good candidate for lumpectomy and sentinel node biopsy. She plans on neoadjuvant chemotherapy. If she proceeds with breast conserving surgery, Dr. Lisbeth Renshaw would recommend external radiotherapy to reduce the risks of local recurrence. We discussed the risks, benefits, short, and long term effects of radiotherapy, and the patient is interested if she's able to have breast conservation. Dr. Lisbeth Renshaw discusses the delivery and logistics of radiotherapy and anticipates a course of 6 1/2 weeks of radiotherapy. We will see her back a few weeks after surgery to discuss the simulation process and anticipate we starting radiotherapy about 4-6 weeks after surgery.  2. Autoimmune disease. The patient is under the care of Dr. Estanislado Pandy, and has recently been counseled on the rationale for immunomodulating therapies including methotrexate. She is hesitant to use this but has been offered Imuran, and has not decided yet how she would like to proceed. She  is aware that if she were to proceed with radiation we would ask that she hold using methotrexate if this was the therapy she chose.  In a visit lasting 60 minutes, greater than 50% of the time was spent face to face reviewing her case, as well as in preparation of, discussing, and coordinating the patient's care.  The above documentation reflects my direct findings during this shared patient visit. Please see the separate note by Dr. Lisbeth Renshaw on this date for the remainder of the patient's plan of care.    Carola Rhine, PAC

## 2020-08-18 ENCOUNTER — Encounter: Payer: Self-pay | Admitting: *Deleted

## 2020-08-18 ENCOUNTER — Telehealth: Payer: Self-pay | Admitting: *Deleted

## 2020-08-18 NOTE — Telephone Encounter (Signed)
Spoke with patient to follow up from [facility] and answer some questions she had regarding the port and chemo.  Patient is very nervous about receiving chemo and how she will feel.  She also will need labs for her CT scan per radiology and I have left a prescription for this for her to pickup as she needs to get this at [facility]. Discussed navigation resources and answered questions and encouraged her to call with any other concerns. Patient verbalized understanding.

## 2020-08-21 ENCOUNTER — Ambulatory Visit (HOSPITAL_COMMUNITY): Payer: 59

## 2020-08-23 ENCOUNTER — Other Ambulatory Visit: Payer: Self-pay

## 2020-08-23 ENCOUNTER — Other Ambulatory Visit: Payer: Self-pay | Admitting: Oncology

## 2020-08-23 ENCOUNTER — Encounter (HOSPITAL_BASED_OUTPATIENT_CLINIC_OR_DEPARTMENT_OTHER): Payer: Self-pay | Admitting: General Surgery

## 2020-08-23 DIAGNOSIS — Z170 Estrogen receptor positive status [ER+]: Secondary | ICD-10-CM

## 2020-08-23 DIAGNOSIS — C50511 Malignant neoplasm of lower-outer quadrant of right female breast: Secondary | ICD-10-CM

## 2020-08-24 ENCOUNTER — Other Ambulatory Visit: Payer: Self-pay

## 2020-08-24 ENCOUNTER — Other Ambulatory Visit: Payer: 59

## 2020-08-24 ENCOUNTER — Other Ambulatory Visit: Payer: Self-pay | Admitting: Oncology

## 2020-08-24 ENCOUNTER — Ambulatory Visit
Admission: RE | Admit: 2020-08-24 | Discharge: 2020-08-24 | Disposition: A | Discharge status: Home / Self Care | Payer: 59 | Source: Ambulatory Visit | Attending: General Surgery | Admitting: General Surgery

## 2020-08-24 ENCOUNTER — Other Ambulatory Visit (INDEPENDENT_AMBULATORY_CARE_PROVIDER_SITE_OTHER): Payer: Self-pay | Admitting: Family Medicine

## 2020-08-24 DIAGNOSIS — R160 Hepatomegaly, not elsewhere classified: Secondary | ICD-10-CM

## 2020-08-24 IMAGING — MR MR ABDOMEN WO/W CM
11 of 17 series · 26 of 48 positions shown · IV contrast (multihance)
Comparison: CT on [DATE]

CLINICAL DATA: Indeterminate liver lesions on recent CT. Right
breast carcinoma.

EXAM:
MRI ABDOMEN WITHOUT AND WITH CONTRAST
TECHNIQUE: Multiplanar multisequence MR imaging of the abdomen was performed
both before and after the administration of intravenous contrast.
CONTRAST:  10mL MULTIHANCE GADOBENATE DIMEGLUMINE 529 MG/ML IV SOLN

[Series 5: [id number] · axial · 6.0mm · 1.98mm/px · z∈[-61,+165]mm · 3 of 90 slices shown]
[im 1/90]
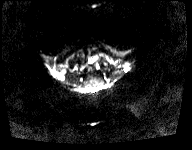
[im 45/90]
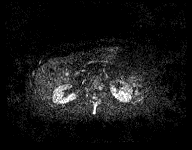
[im 90/90]
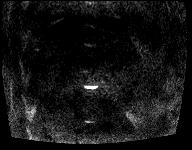

[Series 6: [id number] · axial · 6.0mm · 1.98mm/px · 1 of 30 slices shown]
[im 1/30]
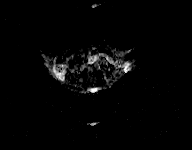

[Series 7: T2 · axial · 6.5mm · 0.74mm/px · 1 of 30 slices shown (1 of 3)]
[im 1/30]
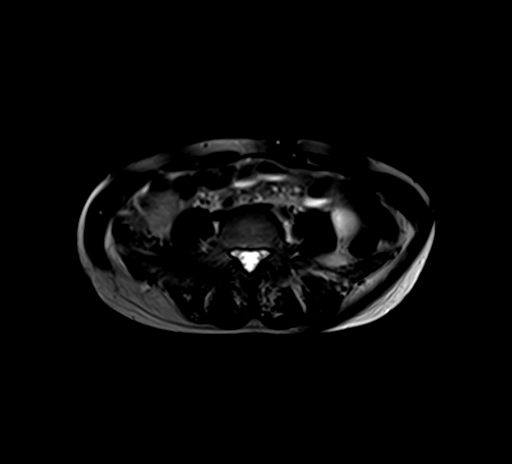

[Series 8: T2 · coronal · 5.0mm · 1.56mm/px · 1 of 30 slices shown (2 of 3)]
[im 1/30]
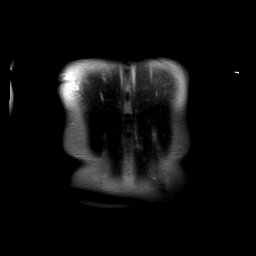

[Series 9: axial tru fisp · axial · 5.0mm · 1.41mm/px · 1 of 37 slices shown]
[im 1/37]
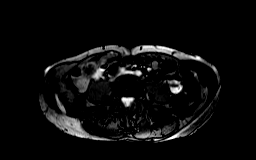

[Series 10: T2 · axial · 5.0mm · 1.37mm/px · 1 of 35 slices shown (3 of 3)]
[im 1/35]
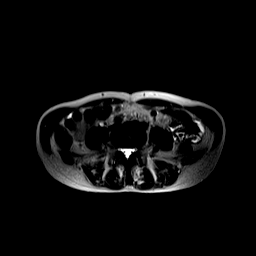

[Series 11: axial in out · axial · 6.0mm · 0.74mm/px · z∈[-47,+153]mm · 2 of 60 slices shown]
[im 1/60]
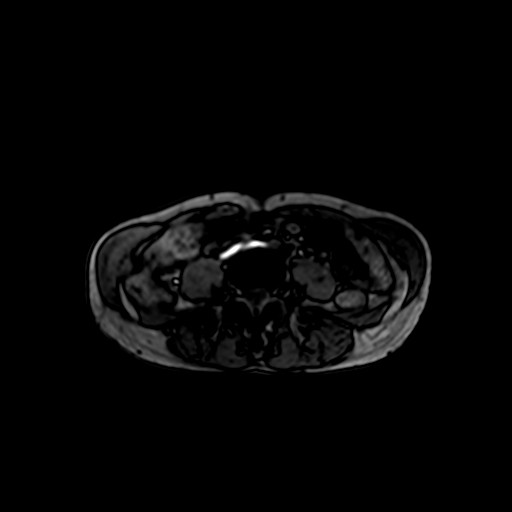
[im 60/60]
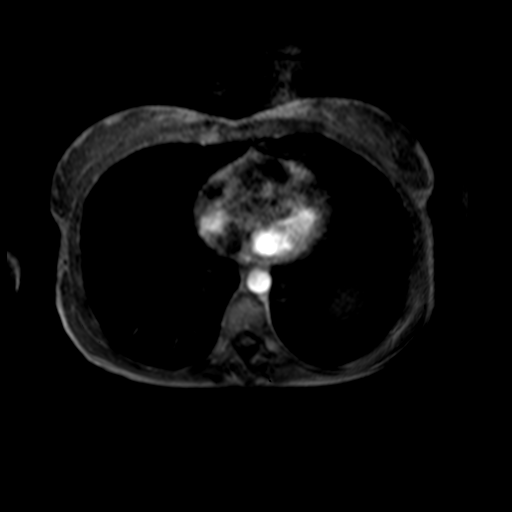

[Series 12: T1 dynamic · axial · non-contrast · 2.7mm · 0.78mm/px · z∈[-54,+159]mm · 4 of 80 slices shown]
[im 1/80]
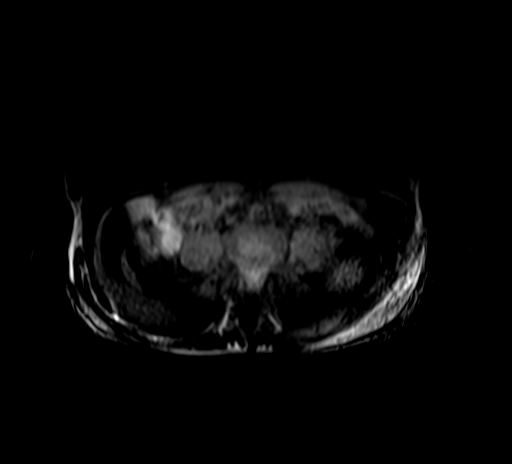
[im 27/80]
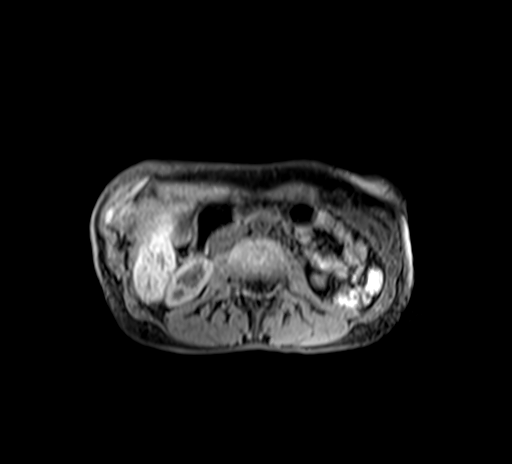
[im 53/80]
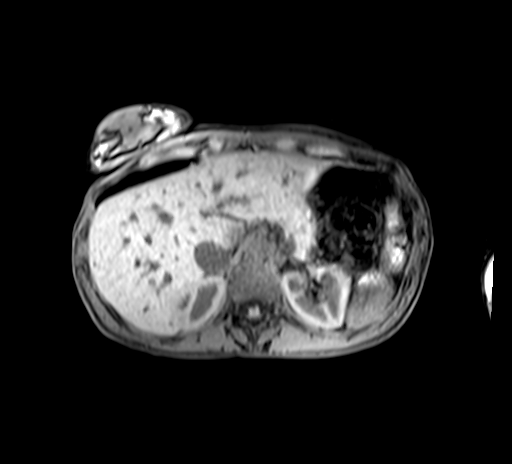
[im 80/80]
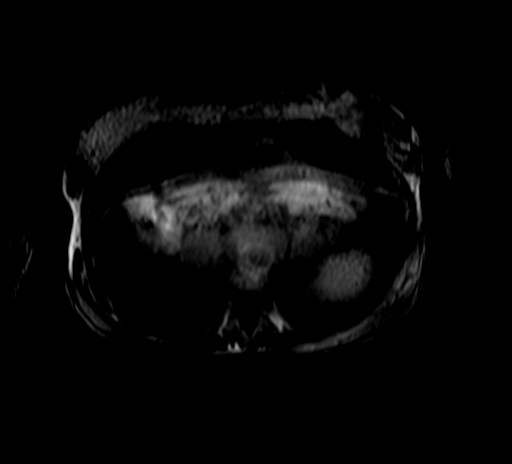

[Series 13: post 25 sec · axial · 2.7mm · 0.78mm/px · z∈[-54,+159]mm · 4 of 80 slices shown]
[im 1/80]
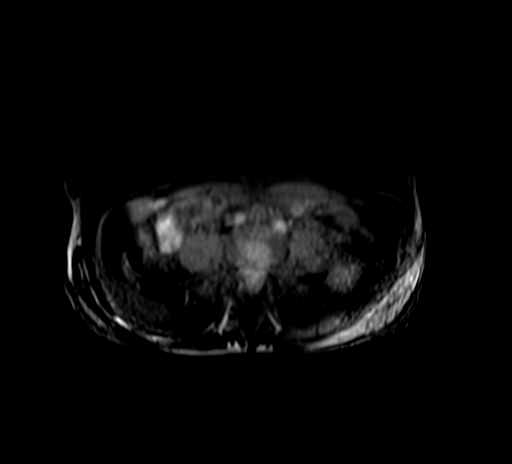
[im 27/80]
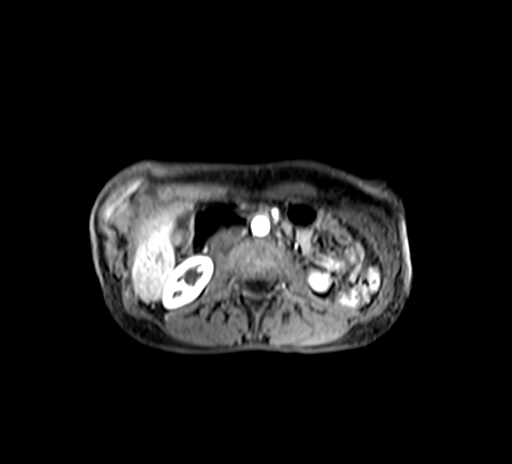
[im 53/80]
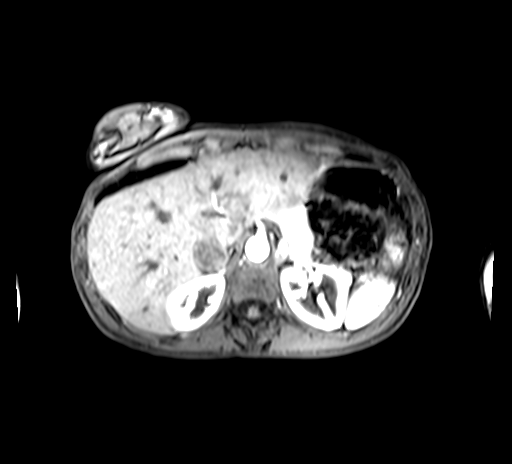
[im 80/80]
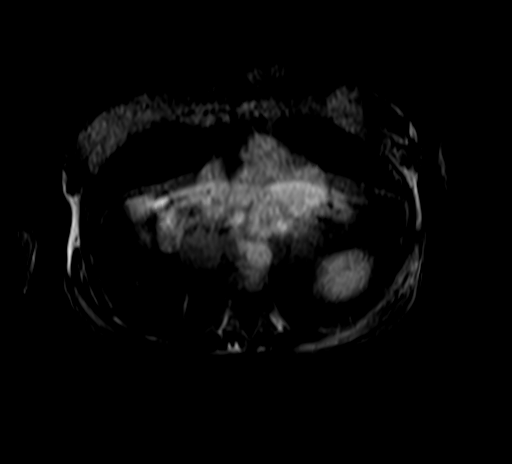

[Series 14: post 25 sec_sub · axial · 2.7mm · 0.78mm/px · z∈[-54,+159]mm · 4 of 80 slices shown]
[im 1/80]
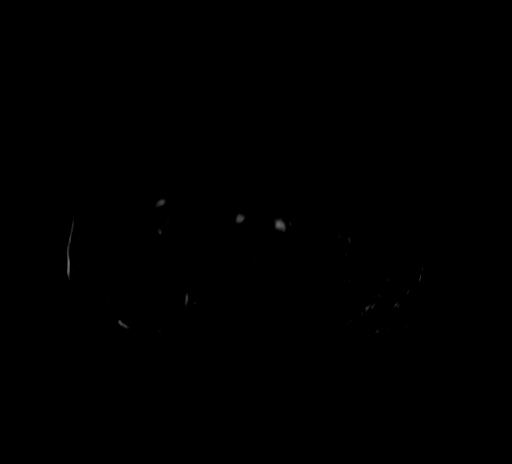
[im 27/80]
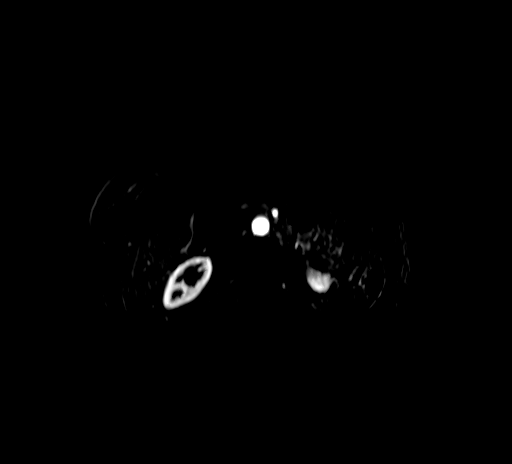
[im 53/80]
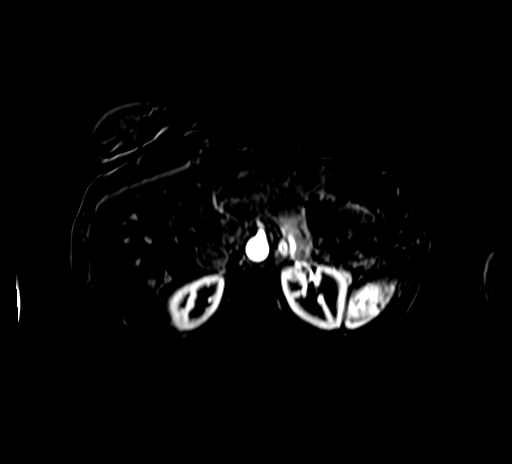
[im 80/80]
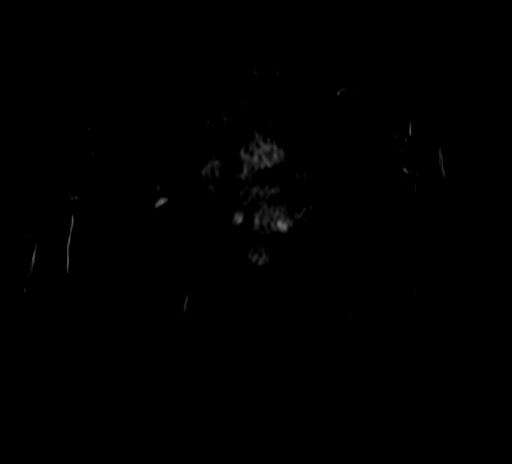

[Series 15: post 45 sec · axial · 2.7mm · 0.78mm/px · z∈[-54,+159]mm · 4 of 80 slices shown]
[im 1/80]
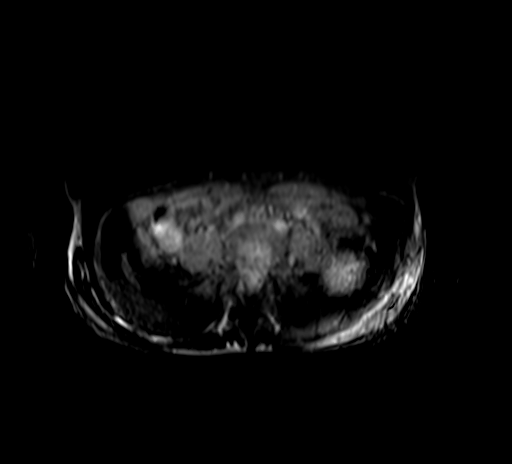
[im 27/80]
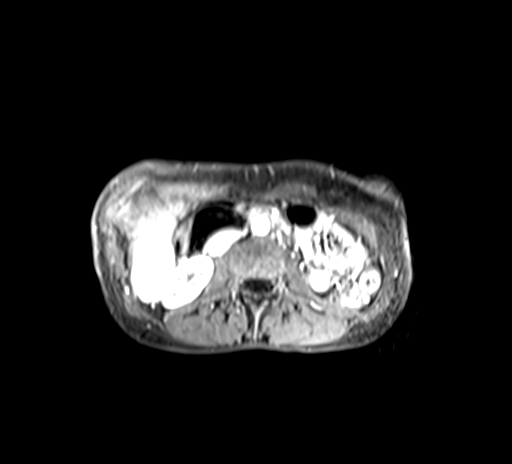
[im 53/80]
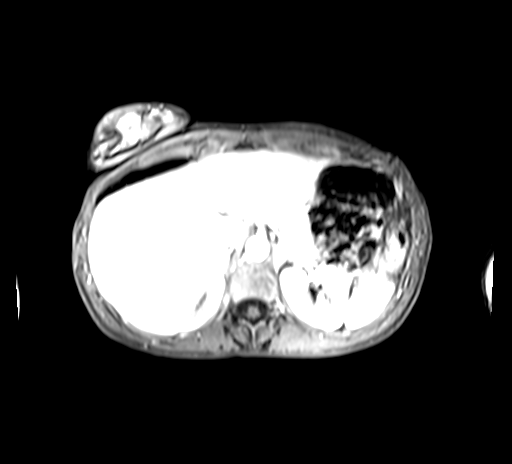
[im 80/80]
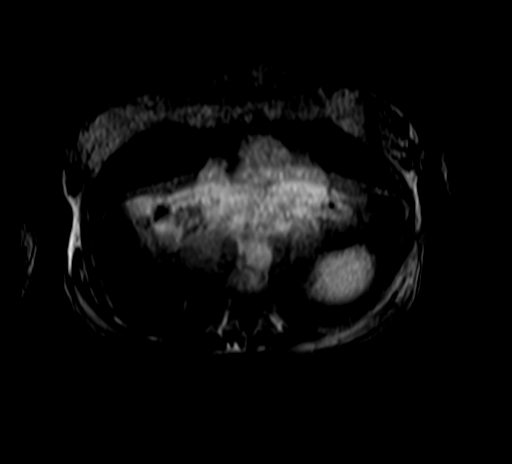

[26 of 48 positions shown; findings below may reference images not displayed]

FINDINGS: Lower chest: No acute findings.

Hepatobiliary: Multiple hypovascular masses are now seen in the
caudate, right, and left hepatic lobes. The largest in the caudate
lobe measures 2.3 x 1.9 cm. The 2nd largest mass is seen in the
anterior liver near the junction of the right and left lobes on
image 17/17, which measures 1.2 x 1.2 cm. These findings are highly
suspicious for liver metastases. A tiny less than 5 mm gallstone is
noted, however there is no evidence of cholecystitis or biliary
ductal dilatation.

Pancreas:  No mass or inflammatory changes.

Spleen:  Within normal limits in size and appearance.

Adrenals/Urinary Tract: No masses identified. No evidence of
hydronephrosis.

Stomach/Bowel: Visualized portion unremarkable.

Vascular/Lymphatic: No pathologically enlarged lymph nodes
identified. No abdominal aortic aneurysm.

Other:  None.

Musculoskeletal:  No suspicious bone lesions identified.
IMPRESSION: Multiple hypovascular masses in the caudate, right, and left hepatic
lobes, highly suspicious for liver metastases.

No other sites of abdominal metastatic disease identified.

Tiny gallstone. No radiographic evidence of cholecystitis or biliary
ductal dilatation.

## 2020-08-24 MED ORDER — GADOBENATE DIMEGLUMINE 529 MG/ML IV SOLN
10.0000 mL | Freq: Once | INTRAVENOUS | Status: AC | PRN
  Administered 2020-08-24: 10 mL via INTRAVENOUS

## 2020-08-24 MED ORDER — PEGFILGRASTIM INJECTION 6 MG/0.6ML ~~LOC~~: 6.0000 mg | PREFILLED_SYRINGE | Freq: Once | SUBCUTANEOUS | 5 refills | Status: AC

## 2020-08-24 NOTE — Progress Notes (Signed)
Received message from [place] team stating pt insurance is requiring pt to self-administer Neulasta from home. LPN successfully faxed [facility] Neulasta enrollment and Rx form to [phone]. Attempted to notify pt to inform her she will self inject Neulasta 0.6 mL (6 mg) 72 hours post chemotherapy treatment every 21 days X6 treatment cycles starting on 09/03/20; however, pt did not answer. LVM for pt to return call to Dr Virgie Dad nurse.

## 2020-08-25 ENCOUNTER — Other Ambulatory Visit: Payer: Self-pay

## 2020-08-25 ENCOUNTER — Other Ambulatory Visit: Payer: 59

## 2020-08-25 ENCOUNTER — Inpatient Hospital Stay: Payer: 59 | Admitting: Oncology

## 2020-08-25 ENCOUNTER — Telehealth: Payer: Self-pay

## 2020-08-25 VITALS — BP 127/76 | HR 66 | Temp 97.7°F | Resp 18 | Ht 64.0 in | Wt 107.6 lb

## 2020-08-25 DIAGNOSIS — Z5112 Encounter for antineoplastic immunotherapy: Secondary | ICD-10-CM | POA: Diagnosis not present

## 2020-08-25 DIAGNOSIS — Z170 Estrogen receptor positive status [ER+]: Secondary | ICD-10-CM | POA: Diagnosis not present

## 2020-08-25 DIAGNOSIS — C50511 Malignant neoplasm of lower-outer quadrant of right female breast: Secondary | ICD-10-CM | POA: Diagnosis not present

## 2020-08-25 LAB — PROTIME-INR
INR: 2.4 — ABNORMAL HIGH (ref 0.9–1.2)
Prothrombin Time: 24.6 s — ABNORMAL HIGH (ref 9.1–12.0)

## 2020-08-25 NOTE — Progress Notes (Signed)
Pharmacist Chemotherapy Monitoring - Initial Assessment    Anticipated start date: 08/31/20   Regimen:   Are orders appropriate based on the patients diagnosis, regimen, and cycle? Yes  Does the plan date match the patients scheduled date? Yes  Is the sequencing of drugs appropriate? Yes  Are the premedications appropriate for the patients regimen? Yes  Prior Authorization for treatment is: Approved o If applicable, is the correct biosimilar selected based on the patient's insurance? yes  Organ Function and Labs:  Are dose adjustments needed based on the patient's renal function, hepatic function, or hematologic function? No  Are appropriate labs ordered prior to the start of patient's treatment? Yes  Other organ system assessment, if indicated: trastuzumab: Echo/ MUGA and pertuzumab: Echo/ MUGA  The following baseline labs, if indicated, have been ordered: N/A  Dose Assessment:  Are the drug doses appropriate? Yes  Are the following correct: o Drug concentrations Yes o IV fluid compatible with drug Yes o Administration routes Yes o Timing of therapy Yes  If applicable, does the patient have documented access for treatment and/or plans for port-a-cath placement? yes  If applicable, have lifetime cumulative doses been properly documented and assessed? yes Lifetime Dose Tracking  No doses have been documented on this patient for the following tracked chemicals: Doxorubicin, Epirubicin, Idarubicin, Daunorubicin, Mitoxantrone, Bleomycin, Oxaliplatin, Carboplatin, Liposomal Doxorubicin  o   Toxicity Monitoring/Prevention:  The patient has the following take home antiemetics prescribed: Prochlorperazine, Dexamethasone and Lorazepam  The patient has the following take home medications prescribed: N/A  Medication allergies and previous infusion related reactions, if applicable, have been reviewed and addressed. Yes  The patient's current medication list has been  assessed for drug-drug interactions with their chemotherapy regimen. Camila - progestin only contraceptive; pt is triple +. significant drug-drug interactions were identified on review.  I've sent Dr. Charlean Sanfilippo message to clarify plan w/ Camila given pts dx.  Order Review:  Are the treatment plan orders signed? Yes  Is the patient scheduled to see a provider prior to their treatment? No  I verify that I have reviewed each item in the above checklist and answered each question accordingly.   Kennith Center, Pharm.D., CPP 08/25/2020@4 :51 PM

## 2020-08-25 NOTE — Progress Notes (Signed)
[facility]  [phone] [phone]) [phone]     ID: Tomika Eckles Macken DOB: [birth year]  MR#: [id number]  JAS#:[id number]  Patient Care Team: Eunice Blase, MD as PCP - General (Family Medicine) Prudence Davidson, [place] Bing, MD as Referring Physician (Oncology) Rockwell Germany, RN as Oncology Nurse Navigator Mauro Kaufmann, RN as Oncology Nurse Navigator Rolm Bookbinder, MD as Consulting Physician (General Surgery) Elliemae Braman, Virgie Dad, MD as Consulting Physician (Oncology) Kyung Rudd, MD as Consulting Physician (Radiation Oncology) Bo Merino, MD as Consulting Physician (Rheumatology) Olga Millers, MD as Consulting Physician (Obstetrics and Gynecology) Chauncey Cruel, MD OTHER MD:  CHIEF COMPLAINT: triple positive breast cancer  CURRENT TREATMENT: Neoadjuvant therapy    INTERVAL HISTORY: Jamilee returns today for follow up of her triple positive breast cancer. She was evaluated in the multidisciplinary breast cancer clinic on 08/16/2020.  She is accompanied by her significant other Phil.  She underwent abdomen MRI yesterday, 08/24/2020, to further evaluate the liver lesions seen on CT abdomen/pelvis from 07/14/2020. MRI showed: multiple hypovascular masses in liver; most consistent with metastatic deposits; no other sites of abdominal metastatic disease.  She is scheduled for bone scan on 08/28/2020 and for chest CT on 08/29/2020.   REVIEW OF SYSTEMS: Malayzia is feeling overwhelmed.  She feels there are too many tests and too many procedures and she cannot imagine doing all that and proceeding to chemo next Wednesday.  She is particularly terrified of port placement and would like to postpone that.    Despite the liver lesions she is having no nausea, vomiting, altered taste, or loss of appetite. Otherwise a detailed review of systems today is stable.  HISTORY OF CURRENT ILLNESS: From the original intake note:  Yezenia herself palpated an abnormality in  the right breast several years ago.  This had not changed until sometime in June 2021.  She was evaluated by Dr. Ouida Sills and underwent right diagnostic mammography with tomography and right breast ultrasonography at [facility] on 07/26/2020 showing: breast density category D; 2.6 cm mass in right breast at 6 o'clock, at site of palpable concern; two focal groups of punctate calcifications posterior to palpable mass; no right axillary adenopathy.  Accordingly on 08/04/2020 she proceeded to biopsy of the right breast area in question. The pathology from this procedure ([id number]) showed: invasive ductal carcinoma, grade 3. Prognostic indicators significant for: estrogen receptor, 95% positive with moderate staining intensity and progesterone receptor, 10% positive with strong staining intensity. Proliferation marker Ki67 at 40%. HER2 equivocal by immunohistochemistry (2+), but positive by fluorescent in situ hybridization with a signals ratio 3.23 and number per cell 6.45.  Biopsy of the posterior calcifications was benign.  She also underwent breast MRI on 08/11/2020 showing: breast composition D; 2.6 cm biopsy-proven malignancy in right breast at 6 o'clock, with probable skin involvement noted on previous ultrasound; additional enhancing masses within right breast-- 5 mm in lower-outer, 1.2 cm in upper-inner, 7 mm in upper-outer; no evidence of left breast malignancy or lymphadenopathy; heterogeneous enhancement of sternum, of uncertain significance.  The patient's subsequent history is as detailed below.   PAST MEDICAL HISTORY: Past Medical History:  Diagnosis Date  . Anti-cardiolipin antibody positive   . Anti-cardiolipin antibody syndrome (HCC)   . Anxiety   . Arthralgia   . Arthritis    bil knees, hands  . Atypical chest pain   . Autoimmune disease ([place])   . Cancer ([place]) 07/2020   right breast IDC  . Chronic fatigue   .  Depression   . DVT (deep venous thrombosis) ([place])   . IBS  (irritable bowel syndrome)    with constipation  . Lupus ([place])   . Pulmonary embolus ([place])   . Raynaud's syndrome   . Sicca ([place])   . Thrombocytopenia ([place])     PAST SURGICAL HISTORY: Past Surgical History:  Procedure Laterality Date  . ABLATION  12/2018   leg veins  . ABLATION Right 08/2019   venous leg   . APPENDECTOMY    . COLON SURGERY     Perforated colon repair after colostomy  . GANGLION CYST EXCISION    . HYSTEROSCOPY WITH D & C N/A 01/13/2014   Procedure: DILATATION AND CURETTAGE /HYSTEROSCOPY with resection ;  Surgeon: Olga Millers, MD;  Location: [facility];  Service: Gynecology;  Laterality: N/A;  . LAPAROTOMY      FAMILY HISTORY: Family History  Problem Relation Age of Onset  . Rheum arthritis Father   . Hemachromatosis Father   . Cancer Sister        bone   as of October 2021 her father isage 52 and her mother age 52.. The patient had one sister (and no brothers). The sister was diagnosed with Ewing's sarcoma at age 52, and died from the disease after many difficult treatments. (age 52)    GYNECOLOGIC HISTORY:  No LMP recorded. (Menstrual status: Irregular Periods).  Menarche: 56 or 54 years old GX P 0 LMP around 2019 Contraceptive: yes HRT no  Hysterectomy? no BSO? 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 She is widowed. She lives at home alone with her cat, Mariea Clonts. She is not a Ambulance person.    ADVANCED DIRECTIVES: not in place; at the 08/16/2020 visit the patient was given the appropriate documents to complete and notarized at her discretion.   HEALTH MAINTENANCE: Social History   Tobacco Use  . Smoking status: Former Smoker    Packs/day: 0.50    Years: 5.00    Pack years: 2.50    Types: Cigarettes    Start date: 08/06/2006    Quit date: 08/07/2011    Years since quitting: 9.0  . Smokeless tobacco: Never Used  Vaping Use  . Vaping Use: Never used  Substance Use Topics  . Alcohol use: Yes     Comment: occasionally  . Drug use: No     Colonoscopy: 2002 for perforated bowel  PAP: 03/2020  Bone density: done at Dr. [facility] office   Allergies  Allergen Reactions  . Beef-Derived Products   . Latex Rash  . Sulfa Antibiotics Rash    Current Outpatient Medications  Medication Sig Dispense Refill  . amphetamine-dextroamphetamine (ADDERALL XR) 20 MG 24 hr capsule Take 1 capsule (20 mg total) by mouth daily. 30 capsule 0  . Ascorbic Acid (VITAMIN C PO) Take 3,000 mg by mouth daily.    Marland Kitchen b complex vitamins capsule Take 1 capsule by mouth daily.    Marland Kitchen CAMILA 0.35 MG tablet Take 1 tablet by mouth daily.  11  . cholecalciferol (VITAMIN D) 1000 UNITS tablet Take 1,000 Units by mouth daily.    Marland Kitchen dexamethasone (DECADRON) 4 MG tablet Take 2 tablets (8 mg total) by mouth 2 (two) times daily. Start the day before Taxotere. Then take daily x 3 days after chemotherapy. 30 tablet 1  . enoxaparin (LOVENOX) 30 MG/0.3ML injection Inject 0.3 mLs (30 mg total) into the skin daily. 6 mL 1  . hydroxychloroquine (PLAQUENIL) 200  MG tablet TAKE 1 TABLET BY MOUTH TWICE DAILY MONDAY THROUGH FRIDAY ONLY. 120 tablet 0  . lidocaine-prilocaine (EMLA) cream Apply to affected area once 30 g 3  . LORazepam (ATIVAN) 0.5 MG tablet Take 1 tablet (0.5 mg total) by mouth at bedtime as needed (Nausea or vomiting). 30 tablet 0  . Multiple Vitamins-Minerals (ZINC PO) Take by mouth.    . prochlorperazine (COMPAZINE) 10 MG tablet Take 0.5-1 tablets (5-10 mg total) by mouth 4 (four) times daily -  before meals and at bedtime. Start the evening of chemotherapy and take for 2 days, then take as needed 30 tablet 1  . SPIRONOLACTONE PO Take 25 mg by mouth as needed.     . tretinoin (RETIN-A) 0.1 % cream tretinoin 0.1 % topical cream    . UNABLE TO FIND Green Vibrance - 1 scoop daily    . warfarin (COUMADIN) 5 MG tablet Take 1 tablet (5 mg total) by mouth daily. 30 tablet 11   No current facility-administered medications for this  visit.    OBJECTIVE: White woman who appears younger than stated age  There were no vitals filed for this visit.   There is no height or weight on file to calculate BMI.   Wt Readings from Last 3 Encounters:  08/16/20 110 lb 3.2 oz (50 kg)  07/27/20 111 lb 8 oz (50.6 kg)  02/24/20 118 lb (53.5 kg)      ECOG FS:1 - Symptomatic but completely ambulatory  Sclerae unicteric, EOMs intact Wearing a mask No cervical or supraclavicular adenopathy Lungs no rales or rhonchi Heart regular rate and rhythm Abd soft, nontender, positive bowel sounds MSK no focal spinal tenderness, no upper extremity lymphedema Neuro: nonfocal, well oriented, appropriate affect Breasts: Deferred; she has palpable disease in the right breast   LAB RESULTS:  CMP     Component Value Date/Time   NA 137 07/28/2020 1108   K 4.4 07/28/2020 1108   CL 98 07/28/2020 1108   CO2 28 07/28/2020 1108   GLUCOSE 57 (L) 07/28/2020 1108   BUN 8 07/28/2020 1108   CREATININE 0.82 07/28/2020 1108   CALCIUM 9.5 07/28/2020 1108   PROT 7.3 07/28/2020 1108   ALBUMIN 4.9 07/28/2020 1108   AST 21 07/28/2020 1108   ALT 18 07/28/2020 1108   ALKPHOS 78 07/28/2020 1108   BILITOT 0.4 07/28/2020 1108   GFRNONAA 83 07/28/2020 1108   GFRAA 95 07/28/2020 1108    No results found for: TOTALPROTELP, ALBUMINELP, A1GS, A2GS, BETS, BETA2SER, GAMS, MSPIKE, SPEI  Lab Results  Component Value Date   WBC 5.5 07/28/2020   NEUTROABS 3.0 07/28/2020   HGB 14.3 07/28/2020   HCT 41.4 07/28/2020   MCV 89 07/28/2020   PLT 202 07/28/2020    No results found for: LABCA2  No components found for: [id number]  Recent Labs  Lab 08/24/20 1125  INR 2.4*    No results found for: LABCA2  No results found for: KMQ286  No results found for: NOT771  No results found for: HAF790  No results found for: CA2729  No components found for: HGQUANT  No results found for: CEA1 / No results found for: CEA1   No results found for:  AFPTUMOR  No results found for: CHROMOGRNA  No results found for: KPAFRELGTCHN, LAMBDASER, KAPLAMBRATIO (kappa/lambda light chains)  No results found for: HGBA, HGBA2QUANT, HGBFQUANT, HGBSQUAN (Hemoglobinopathy evaluation)   No results found for: LDH  Lab Results  Component Value Date   IRON 88 10/29/2018  IRON 88 10/29/2018   TIBC 241 (L) 10/29/2018   TIBC 241 (L) 10/29/2018   IRONPCTSAT 37 10/29/2018   IRONPCTSAT 37 10/29/2018   (Iron and TIBC)  Lab Results  Component Value Date   FERRITIN 43 10/29/2018   FERRITIN 43 10/29/2018    Urinalysis    Component Value Date/Time   APPEARANCEUR Clear 08/08/2020 1359   GLUCOSEU Negative 08/08/2020 1359   BILIRUBINUR Negative 08/08/2020 1359   PROTEINUR Negative 08/08/2020 1359   NITRITE Negative 08/08/2020 1359   LEUKOCYTESUR Negative 08/08/2020 1359     STUDIES: MR ABDOMEN WWO CONTRAST  Result Date: 08/24/2020 CLINICAL DATA:  Indeterminate liver lesions on recent CT. Right breast carcinoma. EXAM: MRI ABDOMEN WITHOUT AND WITH CONTRAST TECHNIQUE: Multiplanar multisequence MR imaging of the abdomen was performed both before and after the administration of intravenous contrast. CONTRAST:  56m MULTIHANCE GADOBENATE DIMEGLUMINE 529 MG/ML IV SOLN COMPARISON:  CT on 07/14/2020 FINDINGS: Lower chest: No acute findings. Hepatobiliary: Multiple hypovascular masses are now seen in the caudate, right, and left hepatic lobes. The largest in the caudate lobe measures 2.3 x 1.9 cm. The 2nd largest mass is seen in the anterior liver near the junction of the right and left lobes on image 17/17, which measures 1.2 x 1.2 cm. These findings are highly suspicious for liver metastases. A tiny less than 5 mm gallstone is noted, however there is no evidence of cholecystitis or biliary ductal dilatation. Pancreas:  No mass or inflammatory changes. Spleen:  Within normal limits in size and appearance. Adrenals/Urinary Tract: No masses identified. No  evidence of hydronephrosis. Stomach/Bowel: Visualized portion unremarkable. Vascular/Lymphatic: No pathologically enlarged lymph nodes identified. No abdominal aortic aneurysm. Other:  None. Musculoskeletal:  No suspicious bone lesions identified. IMPRESSION: Multiple hypovascular masses in the caudate, right, and left hepatic lobes, highly suspicious for liver metastases. No other sites of abdominal metastatic disease identified. Tiny gallstone. No radiographic evidence of cholecystitis or biliary ductal dilatation. Electronically Signed   By: JMarlaine HindM.D.   On: 08/24/2020 16:08   MR BREAST BILATERAL W WO CONTRAST INC CAD  Result Date: 08/14/2020 CLINICAL DATA:  Known biopsy-proven RIGHT breast cancer (08/08/2020). LABS:  Not applicable EXAM: BILATERAL BREAST MRI WITH AND WITHOUT CONTRAST TECHNIQUE: Multiplanar, multisequence MR images of both breasts were obtained prior to and following the intravenous administration of 5 ml of Gadavist Three-dimensional MR images were rendered by post-processing of the original MR data on an independent workstation. The three-dimensional MR images were interpreted, and findings are reported in the following complete MRI report for this study. Three dimensional images were evaluated at the independent interpreting workstation using the DynaCAD thin client. COMPARISON:  Previous exams.  No previous breast MRI. FINDINGS: Breast composition: d. Extreme fibroglandular tissue. Background parenchymal enhancement: Moderate. Right breast: Irregular enhancing mass within the lower RIGHT breast, 6 o'clock axis, measuring 2.6 x 2.2 x 2.5 cm (craniocaudal by transverse by AP dimensions), with associated biopsy clip artifact, corresponding to patient's known biopsy-proven malignancy. The mass is superficial, underlying the skin of the lower RIGHT breast. Previous ultrasound showed probable skin involvement. Additional oval circumscribed enhancing mass within the lower inner quadrant  of the RIGHT breast, at posterior depth, 4 o'clock axis region, measuring 5 mm (axial series 9, image 94). Additional irregular enhancing mass within the upper inner quadrant of the RIGHT breast, at middle depth, measuring 1.2 x 0.7 cm (best seen on sagittal reformatted series 100, images 194 through 197; axial series 12, images 56 through 58). Additional  irregular enhancing mass within the upper-outer quadrant of the RIGHT breast, at anterior depth, measuring 7 mm (axial series 12, image 61; sagittal reformatted series 100, image 224). Additional biopsy clip artifact within the posterior RIGHT breast corresponding to the site of benign stereotactic biopsy. Left breast: No suspicious enhancing mass, non-mass enhancement or secondary signs of malignancy. Lymph nodes: No enlarged or morphologically abnormal lymph nodes are identified within either axillary region or within the internal mammary chain regions. Ancillary findings:  Heterogeneous enhancement of the sternum. IMPRESSION: 1. Known biopsy-proven invasive ductal carcinoma of the RIGHT breast, 6 o'clock axis, measuring 2.6 cm, with associated biopsy clip. This mass is superficial, underlying the skin of the lower RIGHT breast. Previous ultrasound showed probable skin involvement. 2. Additional oval circumscribed enhancing mass within the lower inner quadrant of the RIGHT breast, at posterior depth, measuring 5 mm. 3. Additional irregular enhancing mass within the upper inner quadrant of the RIGHT breast, at middle depth, measuring 1.2 cm. 4. Additional irregular enhancing mass within the upper-outer quadrant of the RIGHT breast, at anterior depth, measuring 7 mm. 5. Additional biopsy clip artifact within the posterior RIGHT breast, corresponding to a benign stereotactic biopsy site. 6. No evidence of malignancy within the LEFT breast. 7. No evidence of malignant lymphadenopathy. 8. Heterogeneous enhancement of the sternum, of uncertain significance. 9. CT  abdomen report of 07/14/2020 describes 2 indeterminate lesions in the liver, largest measuring 2.2 cm, with abdominal MRI recommended for further characterization. RECOMMENDATION: 1. If breast conservation therapy is being considered for the RIGHT breast, recommend additional MRI guided biopsies for the 3 additional masses in the RIGHT breast as described above. Two masses are located within the inner RIGHT breast and 1 mass is located within the upper-outer quadrant of the RIGHT breast. Therefore, if patient positioning for the biopsies does not allow for all 3 masses to be biopsied on the same day, the 2 masses within the inner RIGHT breast could be biopsied on 1 day and the additional mass within the upper-outer RIGHT breast could be biopsied on a separate day. 2. MRI of the abdomen for further characterization of 2 indeterminate liver lesions described on CT abdomen report of 07/14/2020. These liver lesions are concerning given the known RIGHT breast cancer. 3. Would also consider PET-CT and/or bone scan given the heterogeneous enhancement of the sternum. BI-RADS CATEGORY  4: Suspicious. Electronically Signed   By: Franki Cabot M.D.   On: 08/14/2020 11:16   US BREAST LTD UNI RIGHT INC AXILLA  Result Date: 07/26/2020 CLINICAL DATA:  Patient presents for palpable abnormality within the right breast. EXAM: DIGITAL DIAGNOSTIC RIGHT MAMMOGRAM WITH CAD AND TOMO ULTRASOUND RIGHT BREAST COMPARISON:  Previous exam(s). ACR Breast Density Category d: The breast tissue is extremely dense, which lowers the sensitivity of mammography. FINDINGS: Underlying the palpable marker within the inferior aspect of the right breast is an irregular partially obscured mass. Magnification CC and true lateral views of the right breast were obtained. There are loosely grouped punctate calcifications identified within the middle depth and posterior depth of the right breast. Posterior to the mass there are two more focal groups of  calcifications measuring 4 mm. Mammographic images were processed with CAD. On physical exam, there is a firm palpable mass within the inferior anterior right breast. Targeted ultrasound is performed, showing a 2.6 x 2.1 x 1.6 cm irregular hypoechoic mass right breast 6 o'clock position 2 cm from nipple at the site of palpable concern. No right axillary adenopathy. IMPRESSION:  1. Suspicious mass right breast 6 o'clock position at the site of palpable concern. 2. There are two focal groups of punctate calcifications within the middle depth right breast posterior to the palpable mass. RECOMMENDATION: Ultrasound-guided core needle biopsy right breast mass 6 o'clock position. Stereotactic guided core needle biopsy of the two punctate groups of calcifications within the central right breast posterior to the palpable mass for definitive characterization and treatment planning. Given the density of the breast tissue, recommend bilateral breast MRI after biopsy procedures. I have discussed the findings and recommendations with the patient. If applicable, a reminder letter will be sent to the patient regarding the next appointment. BI-RADS CATEGORY  5: Highly suggestive of malignancy. Electronically Signed   By: Lovey Newcomer M.D.   On: 07/26/2020 14:55   MM DIAG BREAST TOMO UNI RIGHT  Result Date: 07/26/2020 CLINICAL DATA:  Patient presents for palpable abnormality within the right breast. EXAM: DIGITAL DIAGNOSTIC RIGHT MAMMOGRAM WITH CAD AND TOMO ULTRASOUND RIGHT BREAST COMPARISON:  Previous exam(s). ACR Breast Density Category d: The breast tissue is extremely dense, which lowers the sensitivity of mammography. FINDINGS: Underlying the palpable marker within the inferior aspect of the right breast is an irregular partially obscured mass. Magnification CC and true lateral views of the right breast were obtained. There are loosely grouped punctate calcifications identified within the middle depth and posterior depth of  the right breast. Posterior to the mass there are two more focal groups of calcifications measuring 4 mm. Mammographic images were processed with CAD. On physical exam, there is a firm palpable mass within the inferior anterior right breast. Targeted ultrasound is performed, showing a 2.6 x 2.1 x 1.6 cm irregular hypoechoic mass right breast 6 o'clock position 2 cm from nipple at the site of palpable concern. No right axillary adenopathy. IMPRESSION: 1. Suspicious mass right breast 6 o'clock position at the site of palpable concern. 2. There are two focal groups of punctate calcifications within the middle depth right breast posterior to the palpable mass. RECOMMENDATION: Ultrasound-guided core needle biopsy right breast mass 6 o'clock position. Stereotactic guided core needle biopsy of the two punctate groups of calcifications within the central right breast posterior to the palpable mass for definitive characterization and treatment planning. Given the density of the breast tissue, recommend bilateral breast MRI after biopsy procedures. I have discussed the findings and recommendations with the patient. If applicable, a reminder letter will be sent to the patient regarding the next appointment. BI-RADS CATEGORY  5: Highly suggestive of malignancy. Electronically Signed   By: Lovey Newcomer M.D.   On: 07/26/2020 14:55   MM CLIP PLACEMENT RIGHT  Result Date: 08/04/2020 CLINICAL DATA:  Evaluate post biopsy marker clip placements following ultrasound and stereotactic core needle biopsy of the right breast. EXAM: DIAGNOSTIC RIGHT MAMMOGRAM POST ULTRASOUND AND STEREOTACTIC BIOPSY: 2 BIOPSIES PERFORMED. COMPARISON:  Previous exam(s). FINDINGS: Mammographic images were obtained following ultrasound guided biopsy of a 2.6 cm right breast mass. The biopsy marking clip is in expected position at the site of biopsy. Mammographic images were obtained following stereotactic guided biopsy of subtle calcifications along the  posterior, inferior and slightly outer aspect. The biopsy marking clip is in expected position at the site of biopsy. IMPRESSION: Appropriate positioning of the ribbon shaped biopsy marking clip at the site of biopsy in the 6 o'clock position of the right breast. Appropriate positioning of the coil shaped biopsy marking clip at the site of biopsy in the in the posterior, slightly lateral and  inferior aspect of the right breast, adjacent to at least 1 punctate calcification. Final Assessment: Post Procedure Mammograms for Marker Placement Electronically Signed   By: Lajean Manes M.D.   On: 08/04/2020 11:49   XR Hand 2 View Left  Result Date: 07/27/2020 Juxta-articular osteopenia was noted.  Narrowing of second and third MCP joints was noted.  PIP and DIP narrowing was noted.  No intercarpal radiocarpal joint space narrowing was noted.  No erosive changes were noted.  No radiographic progression was noted when compared to the x-rays of 2018. Impression: These findings are consistent with inflammatory arthritis and osteoarthritis overlap.  XR Hand 2 View Right  Result Date: 07/27/2020 Juxta-articular osteopenia was noted.  Narrowing of second and third MCP joints was noted.  PIP and DIP narrowing was noted.  No intercarpal or radiocarpal joint space narrowing was noted.  No erosive changes were noted.  Increased narrowing of second and third MCP joints was noted in comparison to the films of 2018. Impression: These findings are consistent with inflammatory arthritis and radiographic progression.  MM RT BREAST BX W LOC DEV 1ST LESION IMAGE BX SPEC STEREO GUIDE  Addendum Date: 08/08/2020   ADDENDUM REPORT: 08/08/2020 08:22 ADDENDUM: Pathology revealed GRADE III INVASIVE DUCTAL CARCINOMA of the RIGHT breast, 6 o'clock, retroareolar. This was found to be concordant by Dr. Lajean Manes. Pathology revealed BENIGN BREAST TISSUE of the RIGHT breast, posterior, lower, slightly outer quadrant. This was found to be  concordant by Dr. Lajean Manes. Pathology results were discussed with the patient by telephone. The patient reported doing well after the biopsies with tenderness at the sites. Post biopsy instructions and care were reviewed and questions were answered. The patient was encouraged to call [facility] for any additional concerns. At the request of Dr. Donne Hazel, the patient was referred to [facility] on August 16, 2020 to follow up with him and other team members. Breast MRI strongly recommended for extent. Further recommendations will be guided by these results. Pathology results reported by Stacie Acres RN on 08/08/2020. Electronically Signed   By: Lajean Manes M.D.   On: 08/08/2020 08:22   Result Date: 08/08/2020 CLINICAL DATA:  Patient presents for ultrasound-guided core needle biopsy of a 2.6 cm right breast mass as well as stereotactic core needle biopsy of right breast calcifications. Two small groups of calcifications in the right breast were recommended for biopsy, only 1 of which was faintly visualized during the stereotactic core needle procedure. EXAM: RIGHT BREAST ULTRASOUND-GUIDED CORE NEEDLE BIOPSY RIGHT BREAST STEREOTACTIC CORE NEEDLE BIOPSY COMPARISON:  Previous exams. FINDINGS: Ultrasound-guided biopsy: I met with the patient and we discussed the procedure of ultrasound-guided biopsy, including benefits and alternatives. We discussed the high likelihood of a successful procedure. We discussed the risks of the procedure, including infection, bleeding, tissue injury, clip migration, and inadequate sampling. Informed written consent was given. The usual time-out protocol was performed immediately prior to the procedure. Lesion #1: Right breast mass Lesion quadrant: Lower inner quadrant, near 6 o'clock, retroareolar. Using sterile technique and 1% Lidocaine as local anesthetic, under direct  ultrasound visualization, a 12 gauge spring-loaded device was used to perform biopsy of the 2.6 cm 6 o'clock retroareolar right breast mass using a lateral approach. At the conclusion of the procedure ribbon shaped tissue marker clip was deployed into the biopsy cavity. Lesion #2: Right breast calcifications. Lesion quadrant: Posterior, lower slightly outer quadrant. The patient  and I discussed the procedure of stereotactic-guided biopsy including benefits and alternatives. We discussed the high likelihood of a successful procedure. We discussed the risks of the procedure including infection, bleeding, tissue injury, clip migration, and inadequate sampling. Informed written consent was given. The usual time out protocol was performed immediately prior to the procedure. Using sterile technique and 1% Lidocaine as local anesthetic, under stereotactic guidance, a 9 gauge vacuum assisted device was used to perform core needle biopsy of subtle calcifications along the posterior margin of the fibroglandular tissue of the lower slightly outer right breast was performed using a superior approach. Specimen radiograph was performed showing a few possible subtle calcifications in 1 specimen. Specimens with calcifications are identified for pathology. Patient had a near syncopal episode during the procedure and no additional specimens were therefore acquired. Lesion quadrant: Lower outer quadrant At the conclusion of the procedure, coil shaped tissue marker clip was deployed into the biopsy cavity. Follow-up 2-view mammogram was performed and dictated separately. IMPRESSION: Ultrasound-guided biopsy of the right breast. No apparent complications. Stereotactic-guided biopsy of the right breast. No apparent complications. Electronically Signed: By: Lajean Manes M.D. On: 08/04/2020 11:45   [place] RT BREAST BX W LOC DEV 1ST LESION IMG BX SPEC US GUIDE  Addendum Date: 08/08/2020   ADDENDUM REPORT: 08/08/2020 08:22 ADDENDUM:  Pathology revealed GRADE III INVASIVE DUCTAL CARCINOMA of the RIGHT breast, 6 o'clock, retroareolar. This was found to be concordant by Dr. Lajean Manes. Pathology revealed BENIGN BREAST TISSUE of the RIGHT breast, posterior, lower, slightly outer quadrant. This was found to be concordant by Dr. Lajean Manes. Pathology results were discussed with the patient by telephone. The patient reported doing well after the biopsies with tenderness at the sites. Post biopsy instructions and care were reviewed and questions were answered. The patient was encouraged to call [facility] for any additional concerns. At the request of Dr. Donne Hazel, the patient was referred to [facility] on August 16, 2020 to follow up with him and other team members. Breast MRI strongly recommended for extent. Further recommendations will be guided by these results. Pathology results reported by Stacie Acres RN on 08/08/2020. Electronically Signed   By: Lajean Manes M.D.   On: 08/08/2020 08:22   Result Date: 08/08/2020 CLINICAL DATA:  Patient presents for ultrasound-guided core needle biopsy of a 2.6 cm right breast mass as well as stereotactic core needle biopsy of right breast calcifications. Two small groups of calcifications in the right breast were recommended for biopsy, only 1 of which was faintly visualized during the stereotactic core needle procedure. EXAM: RIGHT BREAST ULTRASOUND-GUIDED CORE NEEDLE BIOPSY RIGHT BREAST STEREOTACTIC CORE NEEDLE BIOPSY COMPARISON:  Previous exams. FINDINGS: Ultrasound-guided biopsy: I met with the patient and we discussed the procedure of ultrasound-guided biopsy, including benefits and alternatives. We discussed the high likelihood of a successful procedure. We discussed the risks of the procedure, including infection, bleeding, tissue injury, clip migration, and inadequate sampling. Informed written  consent was given. The usual time-out protocol was performed immediately prior to the procedure. Lesion #1: Right breast mass Lesion quadrant: Lower inner quadrant, near 6 o'clock, retroareolar. Using sterile technique and 1% Lidocaine as local anesthetic, under direct ultrasound visualization, a 12 gauge spring-loaded device was used to perform biopsy of the 2.6 cm 6 o'clock retroareolar right breast mass using a lateral approach. At the conclusion of the procedure ribbon shaped tissue marker clip was deployed  into the biopsy cavity. Lesion #2: Right breast calcifications. Lesion quadrant: Posterior, lower slightly outer quadrant. The patient and I discussed the procedure of stereotactic-guided biopsy including benefits and alternatives. We discussed the high likelihood of a successful procedure. We discussed the risks of the procedure including infection, bleeding, tissue injury, clip migration, and inadequate sampling. Informed written consent was given. The usual time out protocol was performed immediately prior to the procedure. Using sterile technique and 1% Lidocaine as local anesthetic, under stereotactic guidance, a 9 gauge vacuum assisted device was used to perform core needle biopsy of subtle calcifications along the posterior margin of the fibroglandular tissue of the lower slightly outer right breast was performed using a superior approach. Specimen radiograph was performed showing a few possible subtle calcifications in 1 specimen. Specimens with calcifications are identified for pathology. Patient had a near syncopal episode during the procedure and no additional specimens were therefore acquired. Lesion quadrant: Lower outer quadrant At the conclusion of the procedure, coil shaped tissue marker clip was deployed into the biopsy cavity. Follow-up 2-view mammogram was performed and dictated separately. IMPRESSION: Ultrasound-guided biopsy of the right breast. No apparent complications.  Stereotactic-guided biopsy of the right breast. No apparent complications. Electronically Signed: By: Lajean Manes M.D. On: 08/04/2020 11:45     ELIGIBLE FOR AVAILABLE RESEARCH PROTOCOL: AET  ASSESSMENT: 54 y.o. [place] woman status post right breast lower outer quadrant biopsy 08/04/2020 for a clinical T2 N0 M1, stage IV invasive ductal carcinoma, grade 3, triple positive, with an MIB-1 of 40%  (a) breast MRI 08/14/2020 shows 3 additional right breast lesions and heterogeneous enhancement of the sternum  (b) biopsy of the additional right breast masses  (c) CT scan of the abdomen and pelvis 07/14/2020 shows 2 indeterminate liver lesions  (d) abdominal MRI 08/24/2020 shows multiple liver lesions highly suspicious for liver metastases.  (e) liver biopsy  (f) bone scan  (g) CT scan of the chest  (h) head CT  (1) neoadjuvant chemotherapy to consist of docetaxel, carboplatin, trastuzumab and pertuzumab every 21 days x 6 starting 08/31/2020  (2) trastuzumab to continue to complete a year  (a) echo 04/12/2020 shows an ejection fraction in the 60-65% range  (3) definitive surgery to follow  (4) adjuvant radiation as appropriate  (5) antiestrogens to start at the completion of local treatment   PLAN: Dolores's liver MRI is most consistent with liver metastases.  This is unfortunate.  Today we discussed the fact that stage IV breast cancer which is what we appear to be dealing with is not curable with our current treatment protocols nevertheless I have had several HER-2 positive patients with pathologically documented liver metastases who are alive and free of disease 10 years later--one of them is working here as a Magazine features editor for example.  So while we still cannot say these patients are cured nevertheless we have long-term disease-free survival with continuing anti-HER-2 treatment in many of these patients.  For that reason I would not change her treatment protocol.  I am hoping we can obtain  a complete pathologic response, proceed to surgery, and then continue anti-HER-2 treatment indefinitely.  I urged her to go ahead and get her port put in but she just does not feel she can do that emotionally.  I have alerted her surgeon to postpone that procedure.  I am also postponing her CT scans of the chest and brain.  She will need a liver biopsy which will be done next week.  I am trying  to get her breast biopsies moved up however so they could be done if possible before she receives chemo as if these represent cancer they could well mild after a single dose  In addition her insurance wants her to dose herself with PEG fill gastrium.  We have obtained that for her and she does have it on hand.  We will teach her how to self administer when she returns for chemo 1021.  Deijah is going to return to see me 1 week after her first chemotherapy dose.  She knows to call for any other issue that may develop before then.   Virgie Dad. Odell Fasching, MD 08/25/2020 11:00 AM Medical Oncology and Hematology [facility] [place], [place] [address] Tel. [phone]    Fax. [phone]   This document serves as a record of services personally performed by Lurline Del, MD. It was created on his behalf by Wilburn Mylar, a trained medical scribe. The creation of this record is based on the scribe's personal observations and the provider's statements to them.   I, Lurline Del MD, have reviewed the above documentation for accuracy and completeness, and I agree with the above.   *Total Encounter Time as defined by the Centers for Medicare and Medicaid Services includes, in addition to the face-to-face time of a patient visit (documented in the note above) non-face-to-face time: obtaining and reviewing outside history, ordering and reviewing medications, tests or procedures, care coordination (communications with other health care professionals or caregivers) and documentation  in the medical record.

## 2020-08-25 NOTE — Telephone Encounter (Signed)
This LPN spoke with pt today to inform her about [facility]; also followed up with [facility] to find out status, tech states [facility] received information and it is in process awaiting verification from pharmacy & insurance. Pt is aware. Pt was also scheduled for bone scan and CT chest for 09/14/20. Pt understands she should arrive at 1145 for inj, CT at 12 and return for bone scan at 1500. Pt is aware and all and understands to call with any questions/concerns.

## 2020-08-26 ENCOUNTER — Other Ambulatory Visit (HOSPITAL_COMMUNITY): Payer: 59

## 2020-08-28 ENCOUNTER — Inpatient Hospital Stay: Payer: 59

## 2020-08-28 ENCOUNTER — Telehealth: Payer: Self-pay | Admitting: Family Medicine

## 2020-08-28 ENCOUNTER — Encounter (HOSPITAL_COMMUNITY): Payer: 59

## 2020-08-28 ENCOUNTER — Other Ambulatory Visit (HOSPITAL_COMMUNITY): Payer: 59

## 2020-08-28 ENCOUNTER — Inpatient Hospital Stay (HOSPITAL_COMMUNITY): Admission: RE | Admit: 2020-08-28 | Payer: 59 | Source: Ambulatory Visit

## 2020-08-28 ENCOUNTER — Encounter: Payer: Self-pay | Admitting: *Deleted

## 2020-08-28 ENCOUNTER — Other Ambulatory Visit: Payer: Self-pay

## 2020-08-28 NOTE — Telephone Encounter (Signed)
INR is good at 2.4.  Recheck in 2 weeks.

## 2020-08-29 ENCOUNTER — Ambulatory Visit (HOSPITAL_COMMUNITY): Payer: 59

## 2020-08-29 ENCOUNTER — Encounter: Payer: Self-pay | Admitting: Oncology

## 2020-08-29 ENCOUNTER — Other Ambulatory Visit: Payer: Self-pay

## 2020-08-29 DIAGNOSIS — C50511 Malignant neoplasm of lower-outer quadrant of right female breast: Secondary | ICD-10-CM

## 2020-08-29 DIAGNOSIS — Z170 Estrogen receptor positive status [ER+]: Secondary | ICD-10-CM

## 2020-08-29 NOTE — Progress Notes (Addendum)
Called pt to introduce myself as her Financial Resource Specialist, discuss copay assistance and the [employer] grant.  I left a msg requesting she return my call if she would like to apply for the grants. 

## 2020-08-30 ENCOUNTER — Telehealth: Payer: Self-pay

## 2020-08-30 NOTE — Telephone Encounter (Signed)
This LPN spoke with [facility] X2 today.First time speaking today was this morning for about 40 minutes attempting to get authorization and have pt set up with Neulasta at home. [facility] states there is no PA for pt to receive Neulasta and they did not start the process until 08/29/20, despite all paperwork being sent to them on 08/24/20.  After speaking with our PA team, PA was given for Neulasta via [employer] # [phone] valid from 08/30/20-02/28/21).   This LPN placed 2nd call to [facility] this afternoon which was a 20 minute conversation to make [facility] aware of PA #. Representative states he will have this noted on pt's account and they will start the [facility] process to have teaching done to pt.  Pt is aware and verbalizes thanks. Will f/u on this 08/31/20.

## 2020-08-31 ENCOUNTER — Other Ambulatory Visit: Payer: Self-pay | Admitting: Oncology

## 2020-08-31 ENCOUNTER — Telehealth: Payer: Self-pay

## 2020-08-31 ENCOUNTER — Other Ambulatory Visit: Payer: Self-pay

## 2020-08-31 ENCOUNTER — Encounter: Payer: Self-pay | Admitting: *Deleted

## 2020-08-31 ENCOUNTER — Inpatient Hospital Stay: Payer: 59

## 2020-08-31 VITALS — BP 97/61 | HR 61 | Temp 98.1°F | Resp 16

## 2020-08-31 DIAGNOSIS — Z170 Estrogen receptor positive status [ER+]: Secondary | ICD-10-CM

## 2020-08-31 DIAGNOSIS — C50511 Malignant neoplasm of lower-outer quadrant of right female breast: Secondary | ICD-10-CM

## 2020-08-31 DIAGNOSIS — Z5112 Encounter for antineoplastic immunotherapy: Secondary | ICD-10-CM | POA: Diagnosis not present

## 2020-08-31 LAB — CMP (CANCER CENTER ONLY)
ALT: 16 U/L (ref 0–44)
AST: 19 U/L (ref 15–41)
Albumin: 3.7 g/dL (ref 3.5–5.0)
Alkaline Phosphatase: 61 U/L (ref 38–126)
Anion gap: 7 (ref 5–15)
BUN: 11 mg/dL (ref 6–20)
CO2: 25 mmol/L (ref 22–32)
Calcium: 8.9 mg/dL (ref 8.9–10.3)
Chloride: 106 mmol/L (ref 98–111)
Creatinine: 0.73 mg/dL (ref 0.44–1.00)
GFR, Estimated: >60 mL/min (ref >60)
Glucose, Bld: 87 mg/dL (ref 70–99)
Potassium: 4 mmol/L (ref 3.5–5.1)
Sodium: 138 mmol/L (ref 135–145)
Total Bilirubin: 0.6 mg/dL (ref 0.3–1.2)
Total Protein: 6.4 g/dL — ABNORMAL LOW (ref 6.5–8.1)

## 2020-08-31 LAB — CBC WITH DIFFERENTIAL (CANCER CENTER ONLY)
Abs Immature Granulocytes: 0.03 10*3/uL (ref 0.00–0.07)
Basophils Absolute: 0 10*3/uL (ref 0.0–0.1)
Basophils Relative: 0 %
Eosinophils Absolute: 0 10*3/uL (ref 0.0–0.5)
Eosinophils Relative: 0 %
HCT: 39.5 % (ref 36.0–46.0)
Hemoglobin: 13.2 g/dL (ref 12.0–15.0)
Immature Granulocytes: 0 %
Lymphocytes Relative: 13 %
Lymphs Abs: 1.1 10*3/uL (ref 0.7–4.0)
MCH: 30.8 pg (ref 26.0–34.0)
MCHC: 33.4 g/dL (ref 30.0–36.0)
MCV: 92.1 fL (ref 80.0–100.0)
Monocytes Absolute: 0.8 10*3/uL (ref 0.1–1.0)
Monocytes Relative: 10 %
Neutro Abs: 6.5 10*3/uL (ref 1.7–7.7)
Neutrophils Relative %: 77 %
Platelet Count: 169 10*3/uL (ref 150–400)
RBC: 4.29 MIL/uL (ref 3.87–5.11)
RDW: 11.8 % (ref 11.5–15.5)
WBC Count: 8.5 10*3/uL (ref 4.0–10.5)
nRBC: 0 % (ref 0.0–0.2)

## 2020-08-31 MED ORDER — SODIUM CHLORIDE 0.9 % IV SOLN
75.0000 mg/m2 | Freq: Once | INTRAVENOUS | Status: AC
  Administered 2020-08-31: 110 mg via INTRAVENOUS
  Filled 2020-08-31: qty 11

## 2020-08-31 MED ORDER — ACETAMINOPHEN 325 MG PO TABS
ORAL_TABLET | ORAL | Status: AC
  Filled 2020-08-31: qty 2

## 2020-08-31 MED ORDER — SODIUM CHLORIDE 0.9 % IV SOLN
150.0000 mg | Freq: Once | INTRAVENOUS | Status: AC
  Administered 2020-08-31: 150 mg via INTRAVENOUS
  Filled 2020-08-31: qty 150

## 2020-08-31 MED ORDER — LORAZEPAM 2 MG/ML IJ SOLN
0.5000 mg | Freq: Once | INTRAMUSCULAR | Status: AC
  Administered 2020-08-31: 0.5 mg via INTRAVENOUS

## 2020-08-31 MED ORDER — TRASTUZUMAB-ANNS CHEMO 150 MG IV SOLR
8.0000 mg/kg | Freq: Once | INTRAVENOUS | Status: AC
  Administered 2020-08-31: 399 mg via INTRAVENOUS
  Filled 2020-08-31: qty 19

## 2020-08-31 MED ORDER — LORAZEPAM 2 MG/ML IJ SOLN
INTRAMUSCULAR | Status: AC
  Filled 2020-08-31: qty 1

## 2020-08-31 MED ORDER — SODIUM CHLORIDE 0.9 % IV SOLN
Freq: Once | INTRAVENOUS | Status: AC
  Filled 2020-08-31: qty 250

## 2020-08-31 MED ORDER — SODIUM CHLORIDE 0.9 % IV SOLN
16.0000 mg | Freq: Once | INTRAVENOUS | Status: AC
  Administered 2020-08-31: 16 mg via INTRAVENOUS
  Filled 2020-08-31: qty 8

## 2020-08-31 MED ORDER — DIPHENHYDRAMINE HCL 25 MG PO CAPS
ORAL_CAPSULE | ORAL | Status: AC
  Filled 2020-08-31: qty 1

## 2020-08-31 MED ORDER — ACETAMINOPHEN 325 MG PO TABS
650.0000 mg | ORAL_TABLET | Freq: Once | ORAL | Status: AC
  Administered 2020-08-31: 650 mg via ORAL

## 2020-08-31 MED ORDER — SODIUM CHLORIDE 0.9 % IV SOLN
449.5000 mg | Freq: Once | INTRAVENOUS | Status: AC
  Administered 2020-08-31: 450 mg via INTRAVENOUS
  Filled 2020-08-31: qty 45

## 2020-08-31 MED ORDER — SODIUM CHLORIDE 0.9 % IV SOLN
10.0000 mg | Freq: Once | INTRAVENOUS | Status: AC
  Administered 2020-08-31: 10 mg via INTRAVENOUS
  Filled 2020-08-31: qty 10

## 2020-08-31 MED ORDER — DIPHENHYDRAMINE HCL 25 MG PO CAPS
25.0000 mg | ORAL_CAPSULE | Freq: Once | ORAL | Status: AC
  Administered 2020-08-31: 25 mg via ORAL

## 2020-08-31 MED ORDER — SODIUM CHLORIDE 0.9 % IV SOLN
420.0000 mg | Freq: Once | INTRAVENOUS | Status: AC
  Administered 2020-08-31: 420 mg via INTRAVENOUS
  Filled 2020-08-31: qty 14

## 2020-08-31 NOTE — Telephone Encounter (Signed)
This LPN attempted to call pt to inform her that her PA for Neulasta was approbved and at this point, [facility] is responsible for care going forward. Pt did not answer. LVM for pt to return call.

## 2020-08-31 NOTE — Patient Instructions (Signed)
[facility] Discharge Instructions for Patients Receiving Chemotherapy  Today you received the following chemotherapy agents: trastuzumab, pertuzumab, docetaxel, carboplatin.  To help prevent nausea and vomiting after your treatment, we encourage you to take your nausea medication as prescribed.   If you develop nausea and vomiting that is not controlled by your nausea medication, call the clinic.   BELOW ARE SYMPTOMS THAT SHOULD BE REPORTED IMMEDIATELY:  *FEVER GREATER THAN 100.5 F  *CHILLS WITH OR WITHOUT FEVER  NAUSEA AND VOMITING THAT IS NOT CONTROLLED WITH YOUR NAUSEA MEDICATION  *UNUSUAL SHORTNESS OF BREATH  *UNUSUAL BRUISING OR BLEEDING  TENDERNESS IN MOUTH AND THROAT WITH OR WITHOUT PRESENCE OF ULCERS  *URINARY PROBLEMS  *BOWEL PROBLEMS  UNUSUAL RASH Items with * indicate a potential emergency and should be followed up as soon as possible.  Feel free to call the clinic should you have any questions or concerns. The clinic phone number is [phone].  Please show the [facility] at check-in to the Emergency Department and triage nurse.  Trastuzumab injection for infusion What is this medicine? TRASTUZUMAB (tras TOO zoo mab) is a monoclonal antibody. It is used to treat breast cancer and stomach cancer. This medicine may be used for other purposes; ask your health care provider or pharmacist if you have questions. COMMON BRAND NAME(S): Herceptin, Galvin Proffer, Trazimera What should I tell my health care provider before I take this medicine? They need to know if you have any of these conditions:  heart disease  heart failure  lung or breathing disease, like asthma  an unusual or allergic reaction to trastuzumab, benzyl alcohol, or other medications, foods, dyes, or preservatives  pregnant or trying to get pregnant  breast-feeding How should I use this medicine? This drug is given as an infusion into a vein. It  is administered in a hospital or clinic by a specially trained health care professional. Talk to your pediatrician regarding the use of this medicine in children. This medicine is not approved for use in children. Overdosage: If you think you have taken too much of this medicine contact a poison control center or emergency room at once. NOTE: This medicine is only for you. Do not share this medicine with others. What if I miss a dose? It is important not to miss a dose. Call your doctor or health care professional if you are unable to keep an appointment. What may interact with this medicine? This medicine may interact with the following medications:  certain types of chemotherapy, such as daunorubicin, doxorubicin, epirubicin, and idarubicin This list may not describe all possible interactions. Give your health care provider a list of all the medicines, herbs, non-prescription drugs, or dietary supplements you use. Also tell them if you smoke, drink alcohol, or use illegal drugs. Some items may interact with your medicine. What should I watch for while using this medicine? Visit your doctor for checks on your progress. Report any side effects. Continue your course of treatment even though you feel ill unless your doctor tells you to stop. Call your doctor or health care professional for advice if you get a fever, chills or sore throat, or other symptoms of a cold or flu. Do not treat yourself. Try to avoid being around people who are sick. You may experience fever, chills and shaking during your first infusion. These effects are usually mild and can be treated with other medicines. Report any side effects during the infusion to your health care professional.  Fever and chills usually do not happen with later infusions. Do not become pregnant while taking this medicine or for 7 months after stopping it. Women should inform their doctor if they wish to become pregnant or think they might be pregnant.  Women of child-bearing potential will need to have a negative pregnancy test before starting this medicine. There is a potential for serious side effects to an unborn child. Talk to your health care professional or pharmacist for more information. Do not breast-feed an infant while taking this medicine or for 7 months after stopping it. Women must use effective birth control with this medicine. What side effects may I notice from receiving this medicine? Side effects that you should report to your doctor or health care professional as soon as possible:  allergic reactions like skin rash, itching or hives, swelling of the face, lips, or tongue  chest pain or palpitations  cough  dizziness  feeling faint or lightheaded, falls  fever  general ill feeling or flu-like symptoms  signs of worsening heart failure like breathing problems; swelling in your legs and feet  unusually weak or tired Side effects that usually do not require medical attention (report to your doctor or health care professional if they continue or are bothersome):  bone pain  changes in taste  diarrhea  joint pain  nausea/vomiting  weight loss This list may not describe all possible side effects. Call your doctor for medical advice about side effects. You may report side effects to FDA at 1-800-FDA-1088. Where should I keep my medicine? This drug is given in a hospital or clinic and will not be stored at home. NOTE: This sheet is a summary. It may not cover all possible information. If you have questions about this medicine, talk to your doctor, pharmacist, or health care provider.  2020 Elsevier/Gold Standard (2016-10-22 14:37:52)  Pertuzumab injection What is this medicine? PERTUZUMAB (per TOOZ ue mab) is a monoclonal antibody. It is used to treat breast cancer. This medicine may be used for other purposes; ask your health care provider or pharmacist if you have questions. COMMON BRAND NAME(S):  PERJETA What should I tell my health care provider before I take this medicine? They need to know if you have any of these conditions:  heart disease  heart failure  high blood pressure  history of irregular heart beat  recent or ongoing radiation therapy  an unusual or allergic reaction to pertuzumab, other medicines, foods, dyes, or preservatives  pregnant or trying to get pregnant  breast-feeding How should I use this medicine? This medicine is for infusion into a vein. It is given by a health care professional in a hospital or clinic setting. Talk to your pediatrician regarding the use of this medicine in children. Special care may be needed. Overdosage: If you think you have taken too much of this medicine contact a poison control center or emergency room at once. NOTE: This medicine is only for you. Do not share this medicine with others. What if I miss a dose? It is important not to miss your dose. Call your doctor or health care professional if you are unable to keep an appointment. What may interact with this medicine? Interactions are not expected. Give your health care provider a list of all the medicines, herbs, non-prescription drugs, or dietary supplements you use. Also tell them if you smoke, drink alcohol, or use illegal drugs. Some items may interact with your medicine. This list may not describe all possible interactions.  Give your health care provider a list of all the medicines, herbs, non-prescription drugs, or dietary supplements you use. Also tell them if you smoke, drink alcohol, or use illegal drugs. Some items may interact with your medicine. What should I watch for while using this medicine? Your condition will be monitored carefully while you are receiving this medicine. Report any side effects. Continue your course of treatment even though you feel ill unless your doctor tells you to stop. Do not become pregnant while taking this medicine or for 7 months  after stopping it. Women should inform their doctor if they wish to become pregnant or think they might be pregnant. Women of child-bearing potential will need to have a negative pregnancy test before starting this medicine. There is a potential for serious side effects to an unborn child. Talk to your health care professional or pharmacist for more information. Do not breast-feed an infant while taking this medicine or for 7 months after stopping it. Women must use effective birth control with this medicine. Call your doctor or health care professional for advice if you get a fever, chills or sore throat, or other symptoms of a cold or flu. Do not treat yourself. Try to avoid being around people who are sick. You may experience fever, chills, and headache during the infusion. Report any side effects during the infusion to your health care professional. What side effects may I notice from receiving this medicine? Side effects that you should report to your doctor or health care professional as soon as possible:  breathing problems  chest pain or palpitations  dizziness  feeling faint or lightheaded  fever or chills  skin rash, itching or hives  sore throat  swelling of the face, lips, or tongue  swelling of the legs or ankles  unusually weak or tired Side effects that usually do not require medical attention (report to your doctor or health care professional if they continue or are bothersome):  diarrhea  hair loss  nausea, vomiting  tiredness This list may not describe all possible side effects. Call your doctor for medical advice about side effects. You may report side effects to FDA at 1-800-FDA-1088. Where should I keep my medicine? This drug is given in a hospital or clinic and will not be stored at home. NOTE: This sheet is a summary. It may not cover all possible information. If you have questions about this medicine, talk to your doctor, pharmacist, or health care  provider.  2020 Elsevier/Gold Standard (2015-11-30 12:08:50)  Docetaxel injection What is this medicine? DOCETAXEL (doe se TAX el) is a chemotherapy drug. It targets fast dividing cells, like cancer cells, and causes these cells to die. This medicine is used to treat many types of cancers like breast cancer, certain stomach cancers, head and neck cancer, lung cancer, and prostate cancer. This medicine may be used for other purposes; ask your health care provider or pharmacist if you have questions. COMMON BRAND NAME(S): Docefrez, Taxotere What should I tell my health care provider before I take this medicine? They need to know if you have any of these conditions:  infection (especially a virus infection such as chickenpox, cold sores, or herpes)  liver disease  low blood counts, like low white cell, platelet, or red cell counts  an unusual or allergic reaction to docetaxel, polysorbate 80, other chemotherapy agents, other medicines, foods, dyes, or preservatives  pregnant or trying to get pregnant  breast-feeding How should I use this medicine? This drug is  given as an infusion into a vein. It is administered in a hospital or clinic by a specially trained health care professional. Talk to your pediatrician regarding the use of this medicine in children. Special care may be needed. Overdosage: If you think you have taken too much of this medicine contact a poison control center or emergency room at once. NOTE: This medicine is only for you. Do not share this medicine with others. What if I miss a dose? It is important not to miss your dose. Call your doctor or health care professional if you are unable to keep an appointment. What may interact with this medicine?  aprepitant  certain antibiotics like erythromycin or clarithromycin  certain antivirals for HIV or hepatitis  certain medicines for fungal infections like fluconazole, itraconazole, ketoconazole, posaconazole, or  voriconazole  cimetidine  ciprofloxacin  conivaptan  cyclosporine  dronedarone  fluvoxamine  grapefruit juice  imatinib  verapamil This list may not describe all possible interactions. Give your health care provider a list of all the medicines, herbs, non-prescription drugs, or dietary supplements you use. Also tell them if you smoke, drink alcohol, or use illegal drugs. Some items may interact with your medicine. What should I watch for while using this medicine? Your condition will be monitored carefully while you are receiving this medicine. You will need important blood work done while you are taking this medicine. Call your doctor or health care professional for advice if you get a fever, chills or sore throat, or other symptoms of a cold or flu. Do not treat yourself. This drug decreases your body's ability to fight infections. Try to avoid being around people who are sick. Some products may contain alcohol. Ask your health care professional if this medicine contains alcohol. Be sure to tell all health care professionals you are taking this medicine. Certain medicines, like metronidazole and disulfiram, can cause an unpleasant reaction when taken with alcohol. The reaction includes flushing, headache, nausea, vomiting, sweating, and increased thirst. The reaction can last from 30 minutes to several hours. You may get drowsy or dizzy. Do not drive, use machinery, or do anything that needs mental alertness until you know how this medicine affects you. Do not stand or sit up quickly, especially if you are an older patient. This reduces the risk of dizzy or fainting spells. Alcohol may interfere with the effect of this medicine. Talk to your health care professional about your risk of cancer. You may be more at risk for certain types of cancer if you take this medicine. Do not become pregnant while taking this medicine or for 6 months after stopping it. Women should inform their doctor if  they wish to become pregnant or think they might be pregnant. There is a potential for serious side effects to an unborn child. Talk to your health care professional or pharmacist for more information. Do not breast-feed an infant while taking this medicine or for 1 week after stopping it. Males who get this medicine must use a condom during sex with females who can get pregnant. If you get a woman pregnant, the baby could have birth defects. The baby could die before they are born. You will need to continue wearing a condom for 3 months after stopping the medicine. Tell your health care provider right away if your partner becomes pregnant while you are taking this medicine. This may interfere with the ability to father a child. You should talk to your doctor or health care professional if you  are concerned about your fertility. What side effects may I notice from receiving this medicine? Side effects that you should report to your doctor or health care professional as soon as possible:  allergic reactions like skin rash, itching or hives, swelling of the face, lips, or tongue  blurred vision  breathing problems  changes in vision  low blood counts - This drug may decrease the number of white blood cells, red blood cells and platelets. You may be at increased risk for infections and bleeding.  nausea and vomiting  pain, redness or irritation at site where injected  pain, tingling, numbness in the hands or feet  redness, blistering, peeling, or loosening of the skin, including inside the mouth  signs of decreased platelets or bleeding - bruising, pinpoint red spots on the skin, black, tarry stools, nosebleeds  signs of decreased red blood cells - unusually weak or tired, fainting spells, lightheadedness  signs of infection - fever or chills, cough, sore throat, pain or difficulty passing urine  swelling of the ankle, feet, hands Side effects that usually do not require medical attention  (report to your doctor or health care professional if they continue or are bothersome):  constipation  diarrhea  fingernail or toenail changes  hair loss  loss of appetite  mouth sores  muscle pain This list may not describe all possible side effects. Call your doctor for medical advice about side effects. You may report side effects to FDA at 1-800-FDA-1088. Where should I keep my medicine? This drug is given in a hospital or clinic and will not be stored at home. NOTE: This sheet is a summary. It may not cover all possible information. If you have questions about this medicine, talk to your doctor, pharmacist, or health care provider.  2020 Elsevier/Gold Standard (2019-06-24 10:19:06)   Carboplatin injection What is this medicine? CARBOPLATIN (KAR boe pla tin) is a chemotherapy drug. It targets fast dividing cells, like cancer cells, and causes these cells to die. This medicine is used to treat ovarian cancer and many other cancers. This medicine may be used for other purposes; ask your health care provider or pharmacist if you have questions. COMMON BRAND NAME(S): Paraplatin What should I tell my health care provider before I take this medicine? They need to know if you have any of these conditions:  blood disorders  hearing problems  kidney disease  recent or ongoing radiation therapy  an unusual or allergic reaction to carboplatin, cisplatin, other chemotherapy, other medicines, foods, dyes, or preservatives  pregnant or trying to get pregnant  breast-feeding How should I use this medicine? This drug is usually given as an infusion into a vein. It is administered in a hospital or clinic by a specially trained health care professional. Talk to your pediatrician regarding the use of this medicine in children. Special care may be needed. Overdosage: If you think you have taken too much of this medicine contact a poison control center or emergency room at once. NOTE:  This medicine is only for you. Do not share this medicine with others. What if I miss a dose? It is important not to miss a dose. Call your doctor or health care professional if you are unable to keep an appointment. What may interact with this medicine?  medicines for seizures  medicines to increase blood counts like filgrastim, pegfilgrastim, sargramostim  some antibiotics like amikacin, gentamicin, neomycin, streptomycin, tobramycin  vaccines Talk to your doctor or health care professional before taking any of these  medicines:  acetaminophen  aspirin  ibuprofen  ketoprofen  naproxen This list may not describe all possible interactions. Give your health care provider a list of all the medicines, herbs, non-prescription drugs, or dietary supplements you use. Also tell them if you smoke, drink alcohol, or use illegal drugs. Some items may interact with your medicine. What should I watch for while using this medicine? Your condition will be monitored carefully while you are receiving this medicine. You will need important blood work done while you are taking this medicine. This drug may make you feel generally unwell. This is not uncommon, as chemotherapy can affect healthy cells as well as cancer cells. Report any side effects. Continue your course of treatment even though you feel ill unless your doctor tells you to stop. In some cases, you may be given additional medicines to help with side effects. Follow all directions for their use. Call your doctor or health care professional for advice if you get a fever, chills or sore throat, or other symptoms of a cold or flu. Do not treat yourself. This drug decreases your body's ability to fight infections. Try to avoid being around people who are sick. This medicine may increase your risk to bruise or bleed. Call your doctor or health care professional if you notice any unusual bleeding. Be careful brushing and flossing your teeth or using  a toothpick because you may get an infection or bleed more easily. If you have any dental work done, tell your dentist you are receiving this medicine. Avoid taking products that contain aspirin, acetaminophen, ibuprofen, naproxen, or ketoprofen unless instructed by your doctor. These medicines may hide a fever. Do not become pregnant while taking this medicine. Women should inform their doctor if they wish to become pregnant or think they might be pregnant. There is a potential for serious side effects to an unborn child. Talk to your health care professional or pharmacist for more information. Do not breast-feed an infant while taking this medicine. What side effects may I notice from receiving this medicine? Side effects that you should report to your doctor or health care professional as soon as possible:  allergic reactions like skin rash, itching or hives, swelling of the face, lips, or tongue  signs of infection - fever or chills, cough, sore throat, pain or difficulty passing urine  signs of decreased platelets or bleeding - bruising, pinpoint red spots on the skin, black, tarry stools, nosebleeds  signs of decreased red blood cells - unusually weak or tired, fainting spells, lightheadedness  breathing problems  changes in hearing  changes in vision  chest pain  high blood pressure  low blood counts - This drug may decrease the number of white blood cells, red blood cells and platelets. You may be at increased risk for infections and bleeding.  nausea and vomiting  pain, swelling, redness or irritation at the injection site  pain, tingling, numbness in the hands or feet  problems with balance, talking, walking  trouble passing urine or change in the amount of urine Side effects that usually do not require medical attention (report to your doctor or health care professional if they continue or are bothersome):  hair loss  loss of appetite  metallic taste in the mouth  or changes in taste This list may not describe all possible side effects. Call your doctor for medical advice about side effects. You may report side effects to FDA at 1-800-FDA-1088. Where should I keep my medicine? This drug is  given in a hospital or clinic and will not be stored at home. NOTE: This sheet is a summary. It may not cover all possible information. If you have questions about this medicine, talk to your doctor, pharmacist, or health care provider.  2020 Elsevier/Gold Standard (2008-02-02 14:38:05)

## 2020-09-01 ENCOUNTER — Telehealth: Payer: Self-pay

## 2020-09-01 ENCOUNTER — Telehealth: Payer: Self-pay | Admitting: *Deleted

## 2020-09-01 NOTE — Telephone Encounter (Signed)
Called patient in regard to some questions she had about her [place] abd on Monday. She was asking why this needed to be done as she thought she was getting an liver biopsy. Explained to her and significant other that usually the process is that the information goes the interventional radiologist to review and then they schedule or decide if anything else is needed. Her significant other was not happy with this explanation.  Informed him I would call to see if anyone in interventional radiology could give them a call to further explain.    This RN called and left messages and did get a return phone call from their scheduling department. She explained to me that the reason for the US abdomen would be to see if they are able to get to the area to bx by u/s and if not a surgical approach may be needed. She stated that everyone else in IR was gone for the day.  Called [place] back and explained this to her and significant other.  She was agreeable with the explanation although her significant other was not and couldn't understand why someone from IR wasn't calling them to explain this and he states that "maybe it's time to look elsewhere for care."  He was very frustrated and I tried to calm and explain. He states that it wasn't me he was frustrated with but with IR.  Informed him I would call 1st thing Monday morning to see if one of the IR doctors could call them and give a better explanation.  They were appreciative.

## 2020-09-01 NOTE — Telephone Encounter (Signed)
Notified [facility] of prior Authorization request number: [id number] approved 08/31/2020 through 03/01/2021 for Neulasta 6 mg.   "Order noted that patient has allergy to latex.  Latex in needle stopper.  Pharmacist request alternative." Provider request latex-free alternatives or may try to proceed with current order.   Patient reports "Gloves and Band-aids cause rash.  I'll be cleaning area with alcohol so I can use Neulasta if you all approve."  Pharmacist checked Ziextenzo, Granix as requested however Faylene Kurtz is the only alternative without latex.   Provider notified af above and patient willing to try Neulasta.   Pharmacist noted Neulasta order Neulasta repeating Nivestym may be used in future if needed.

## 2020-09-01 NOTE — Telephone Encounter (Signed)
[facility] called to confirm Rx for Neulasta and set up home health. This LPN confirmed with them and they state they will work on getting this out to pt. Pt is aware and verbalizes thanks.

## 2020-09-04 ENCOUNTER — Inpatient Hospital Stay (HOSPITAL_BASED_OUTPATIENT_CLINIC_OR_DEPARTMENT_OTHER): Payer: 59 | Admitting: Medical

## 2020-09-04 ENCOUNTER — Other Ambulatory Visit: Payer: Self-pay

## 2020-09-04 ENCOUNTER — Ambulatory Visit (HOSPITAL_COMMUNITY)
Admission: RE | Admit: 2020-09-04 | Discharge: 2020-09-04 | Disposition: A | Discharge status: Home / Self Care | Payer: 59 | Source: Ambulatory Visit | Attending: Medical | Admitting: Medical

## 2020-09-04 ENCOUNTER — Ambulatory Visit (HOSPITAL_COMMUNITY)
Admission: RE | Admit: 2020-09-04 | Discharge: 2020-09-04 | Disposition: A | Discharge status: Home / Self Care | Payer: 59 | Source: Ambulatory Visit | Attending: Oncology | Admitting: Oncology

## 2020-09-04 ENCOUNTER — Inpatient Hospital Stay: Payer: 59

## 2020-09-04 ENCOUNTER — Telehealth: Payer: Self-pay

## 2020-09-04 VITALS — BP 114/67 | HR 66 | Temp 97.8°F | Resp 18 | Ht 64.0 in | Wt 110.5 lb

## 2020-09-04 DIAGNOSIS — Z86718 Personal history of other venous thrombosis and embolism: Secondary | ICD-10-CM

## 2020-09-04 DIAGNOSIS — K5909 Other constipation: Secondary | ICD-10-CM | POA: Diagnosis present

## 2020-09-04 DIAGNOSIS — C50511 Malignant neoplasm of lower-outer quadrant of right female breast: Secondary | ICD-10-CM | POA: Diagnosis present

## 2020-09-04 DIAGNOSIS — I2699 Other pulmonary embolism without acute cor pulmonale: Secondary | ICD-10-CM

## 2020-09-04 DIAGNOSIS — J029 Acute pharyngitis, unspecified: Secondary | ICD-10-CM

## 2020-09-04 DIAGNOSIS — Z170 Estrogen receptor positive status [ER+]: Secondary | ICD-10-CM | POA: Insufficient documentation

## 2020-09-04 DIAGNOSIS — Z5112 Encounter for antineoplastic immunotherapy: Secondary | ICD-10-CM | POA: Diagnosis not present

## 2020-09-04 LAB — CMP (CANCER CENTER ONLY)
ALT: 52 U/L — ABNORMAL HIGH (ref 0–44)
AST: 46 U/L — ABNORMAL HIGH (ref 15–41)
Albumin: 3.7 g/dL (ref 3.5–5.0)
Alkaline Phosphatase: 53 U/L (ref 38–126)
Anion gap: 3 — ABNORMAL LOW (ref 5–15)
BUN: 18 mg/dL (ref 6–20)
CO2: 30 mmol/L (ref 22–32)
Calcium: 8.9 mg/dL (ref 8.9–10.3)
Chloride: 105 mmol/L (ref 98–111)
Creatinine: 0.72 mg/dL (ref 0.44–1.00)
GFR, Estimated: >60 mL/min (ref >60)
Glucose, Bld: 98 mg/dL (ref 70–99)
Potassium: 4.1 mmol/L (ref 3.5–5.1)
Sodium: 138 mmol/L (ref 135–145)
Total Bilirubin: 0.9 mg/dL (ref 0.3–1.2)
Total Protein: 6.2 g/dL — ABNORMAL LOW (ref 6.5–8.1)

## 2020-09-04 LAB — CBC WITH DIFFERENTIAL (CANCER CENTER ONLY)
Abs Immature Granulocytes: 0.2 10*3/uL — ABNORMAL HIGH (ref 0.00–0.07)
Band Neutrophils: 2 %
Basophils Absolute: 0 10*3/uL (ref 0.0–0.1)
Basophils Relative: 0 %
Eosinophils Absolute: 0 10*3/uL (ref 0.0–0.5)
Eosinophils Relative: 0 %
HCT: 41.6 % (ref 36.0–46.0)
Hemoglobin: 13.7 g/dL (ref 12.0–15.0)
Lymphocytes Relative: 6 %
Lymphs Abs: 1 10*3/uL (ref 0.7–4.0)
MCH: 29.8 pg (ref 26.0–34.0)
MCHC: 32.9 g/dL (ref 30.0–36.0)
MCV: 90.6 fL (ref 80.0–100.0)
Metamyelocytes Relative: 1 %
Monocytes Absolute: 0 10*3/uL — ABNORMAL LOW (ref 0.1–1.0)
Monocytes Relative: 0 %
Neutro Abs: 15.6 10*3/uL — ABNORMAL HIGH (ref 1.7–7.7)
Neutrophils Relative %: 91 %
Platelet Count: 135 10*3/uL — ABNORMAL LOW (ref 150–400)
RBC: 4.59 MIL/uL (ref 3.87–5.11)
RDW: 11.6 % (ref 11.5–15.5)
WBC Count: 16.8 10*3/uL — ABNORMAL HIGH (ref 4.0–10.5)
nRBC: 0 % (ref 0.0–0.2)

## 2020-09-04 IMAGING — CR DG ABDOMEN 2V
2 series · 2 of 2 positions shown · non-contrast
Comparison: None.

CLINICAL DATA: Constipation, diffuse abdominal pain, breast cancer.

EXAM:
ABDOMEN - 2 VIEW

[w abdomen upright *]
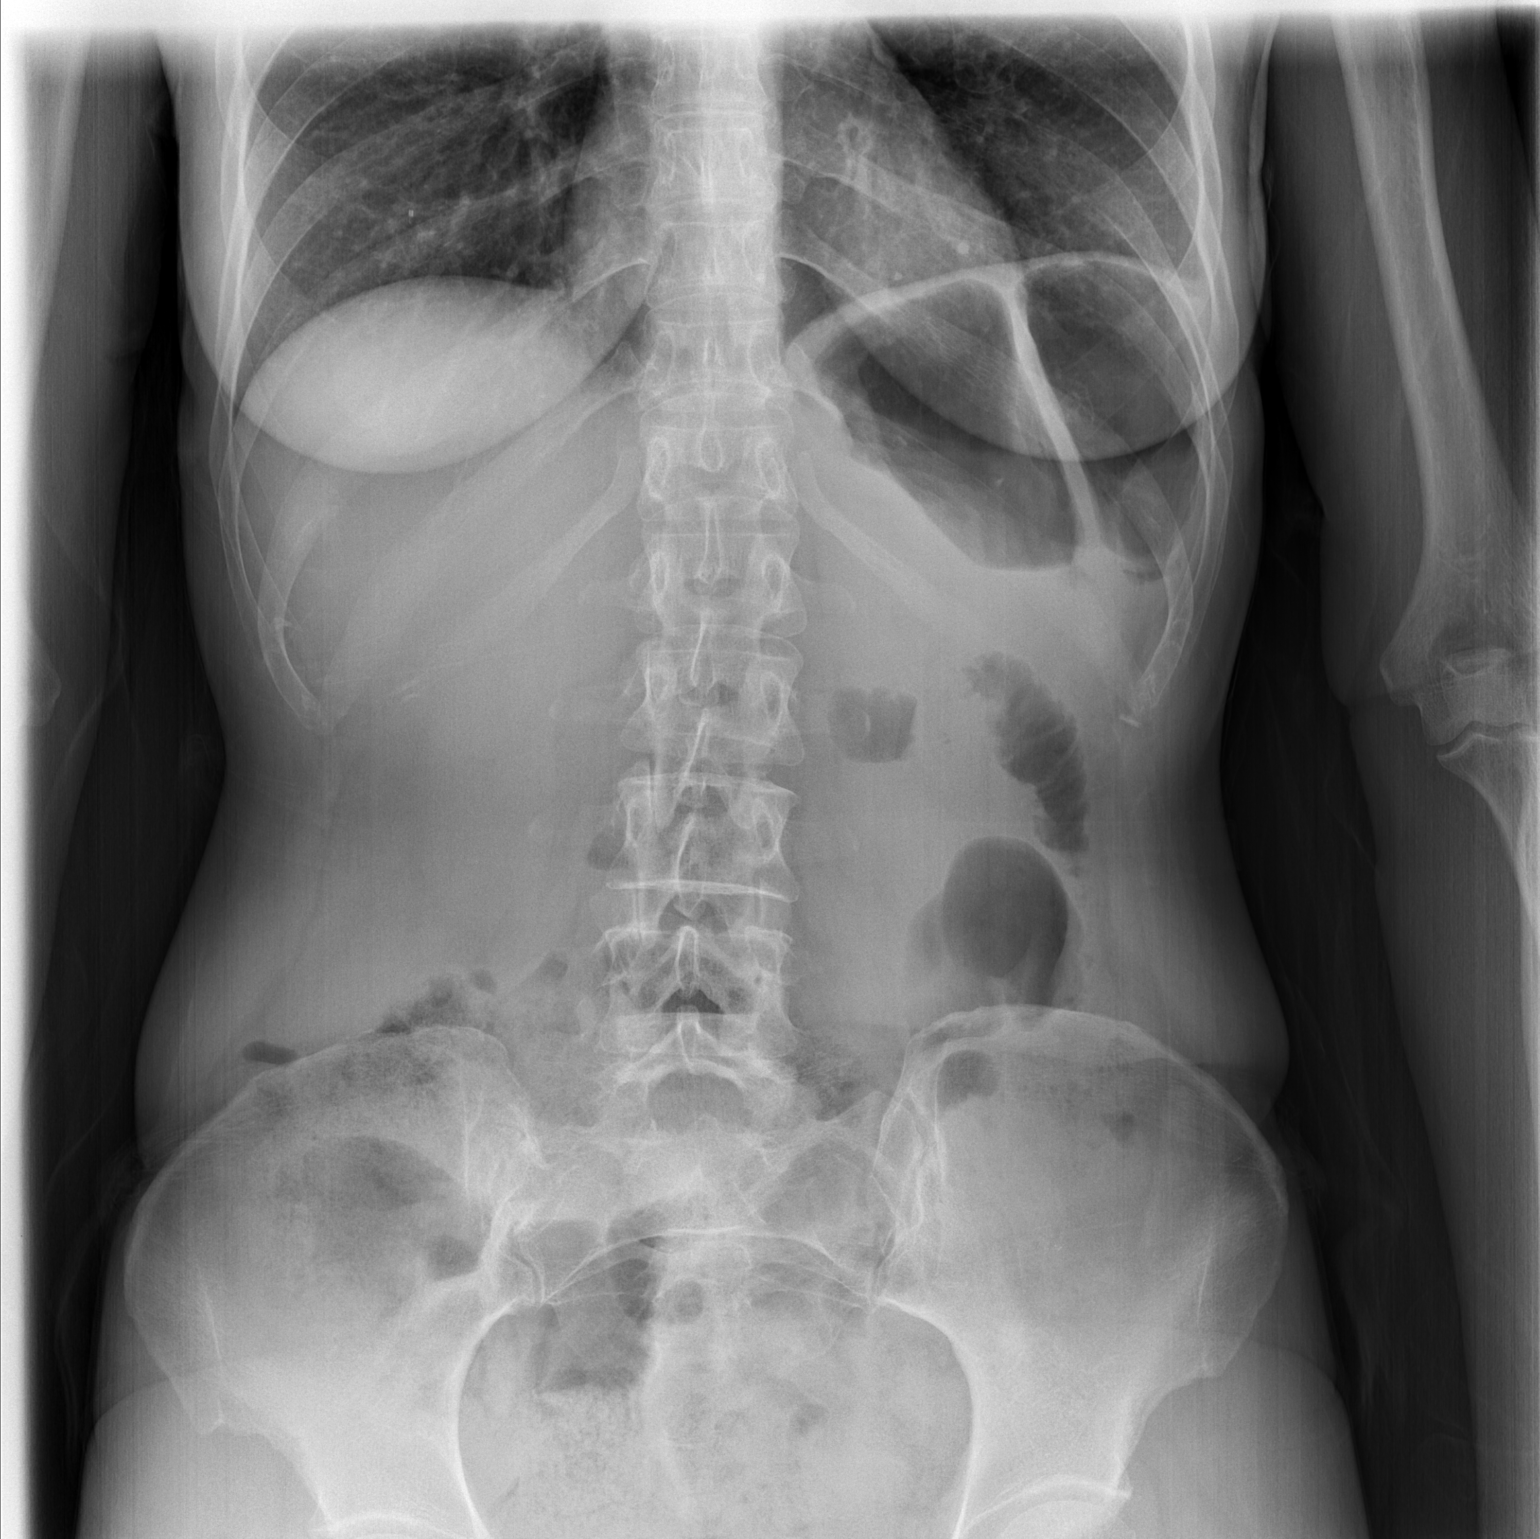

[t abdomen supine]
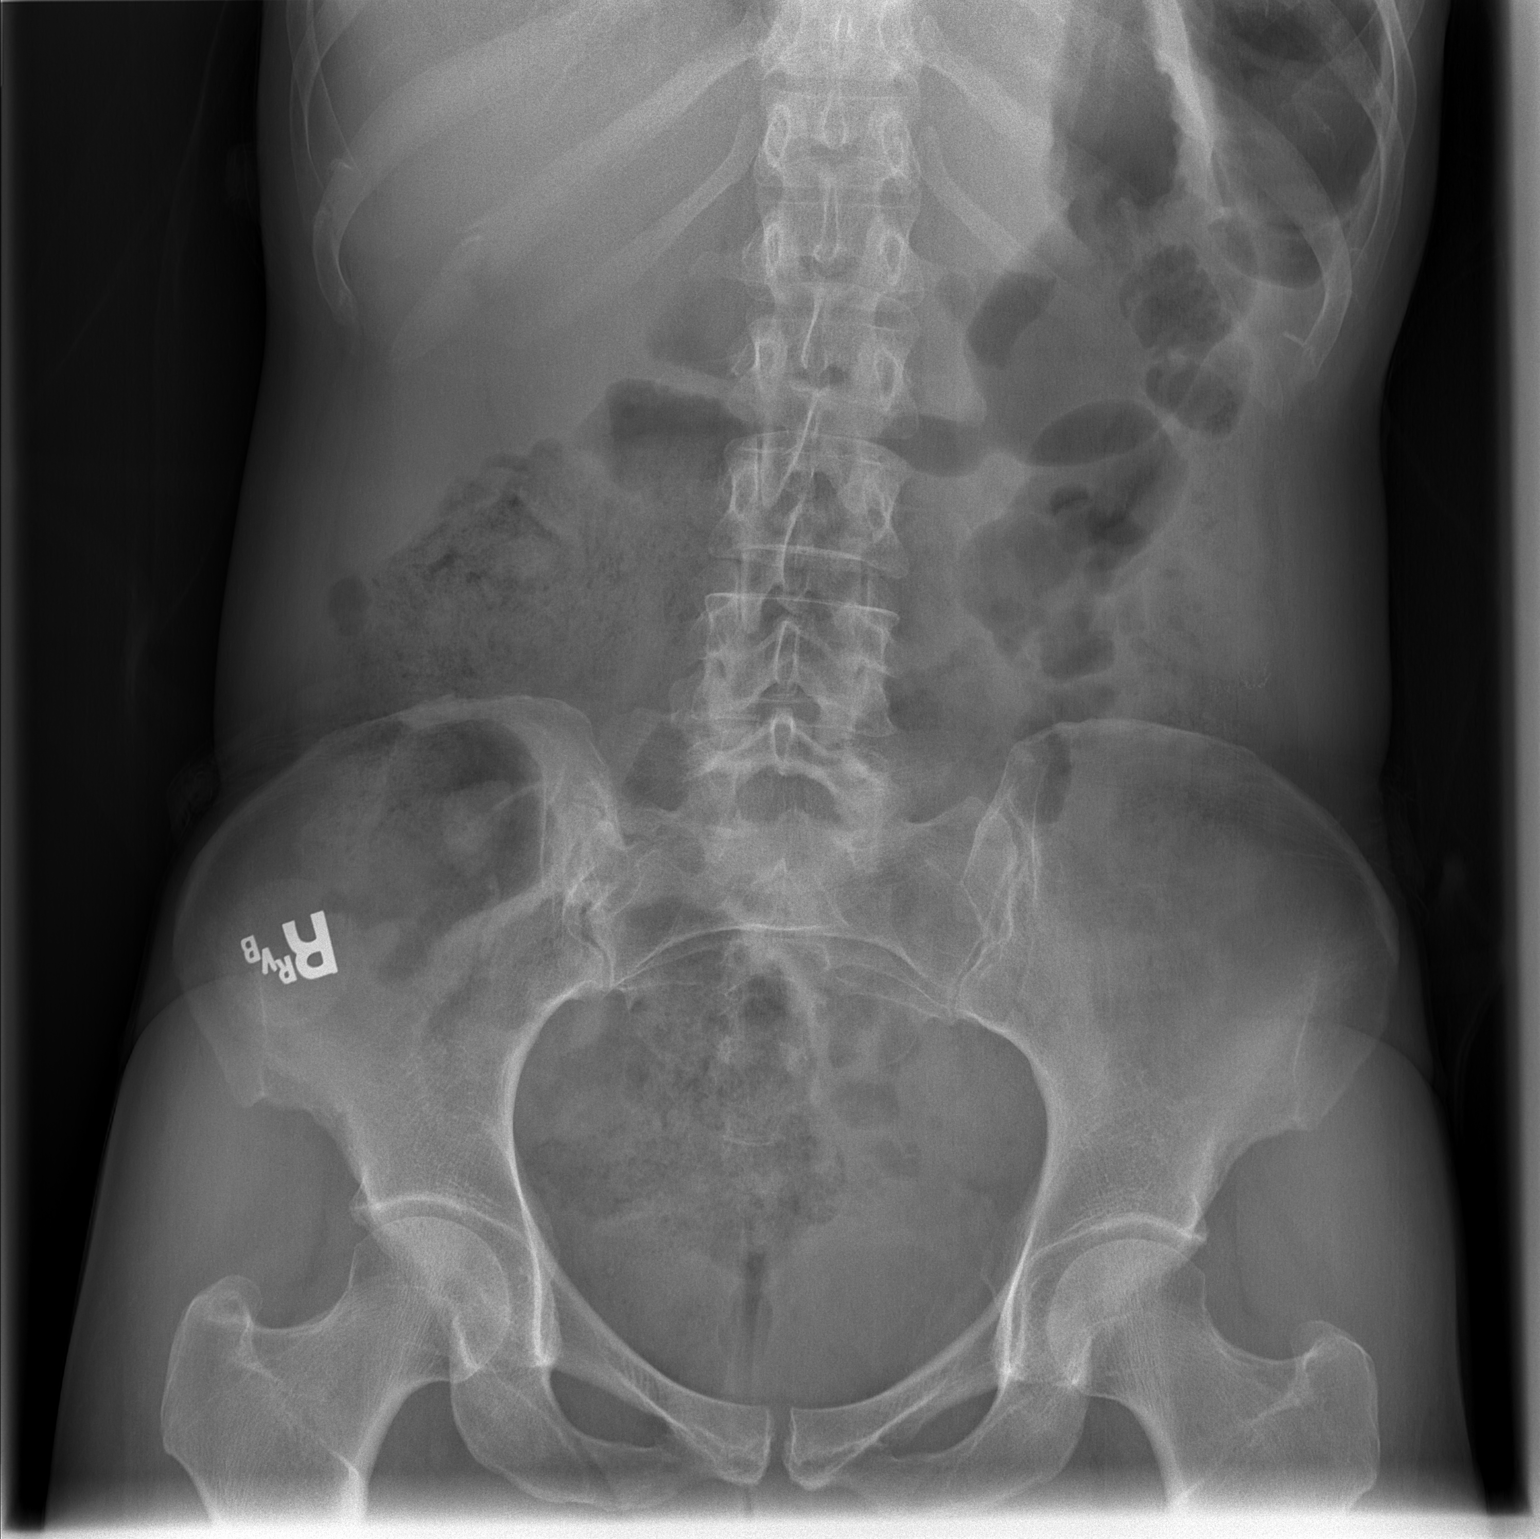

[2 of 2 positions shown; findings below may reference images not displayed]

FINDINGS: Fair amount of stool is seen in the colon. No small bowel
dilatation. No unexpected radiopaque calculi.
IMPRESSION: Fair amount of stool in the colon is indicative of constipation.

## 2020-09-04 IMAGING — US US ABDOMEN LIMITED
1 series · 14 of 25 positions shown · non-contrast
Comparison: [DATE] [DATE], [DATE].  [DATE] [DATE], [DATE].

CLINICAL DATA: History of breast cancer.

EXAM:
ULTRASOUND ABDOMEN LIMITED RIGHT UPPER QUADRANT

[Series 1: us abdomen limited · 54 acquisitions, 14 frames shown]
[im 1/54]
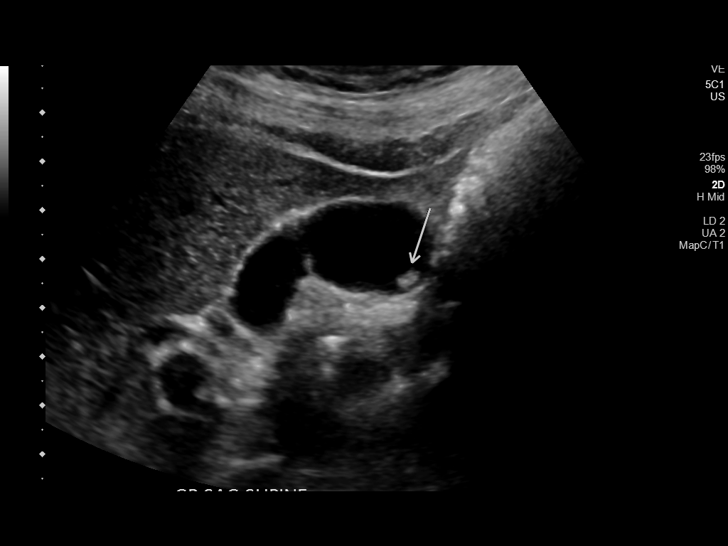
[im 5/54]
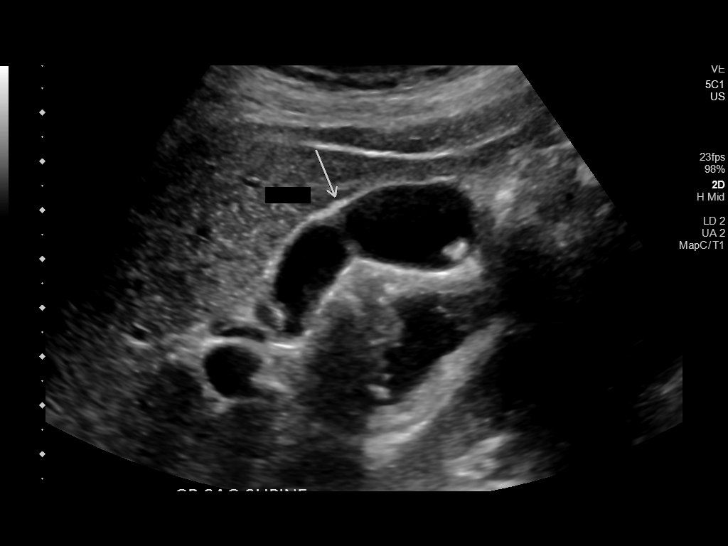
[im 9/54]
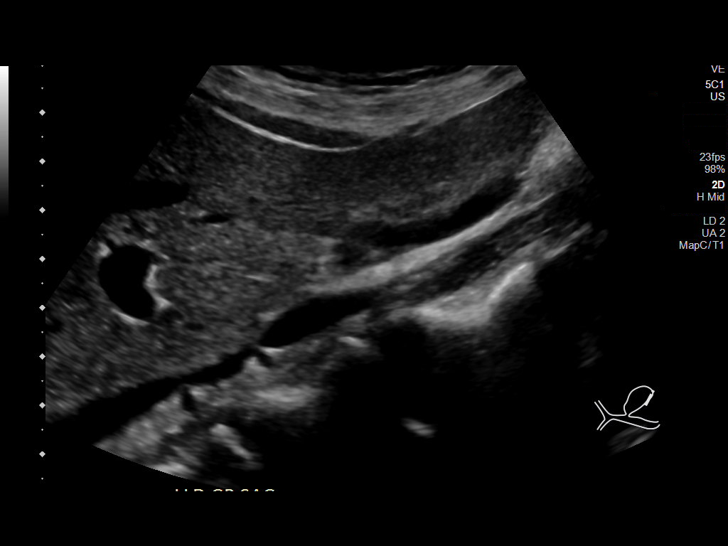
[im 14/54]
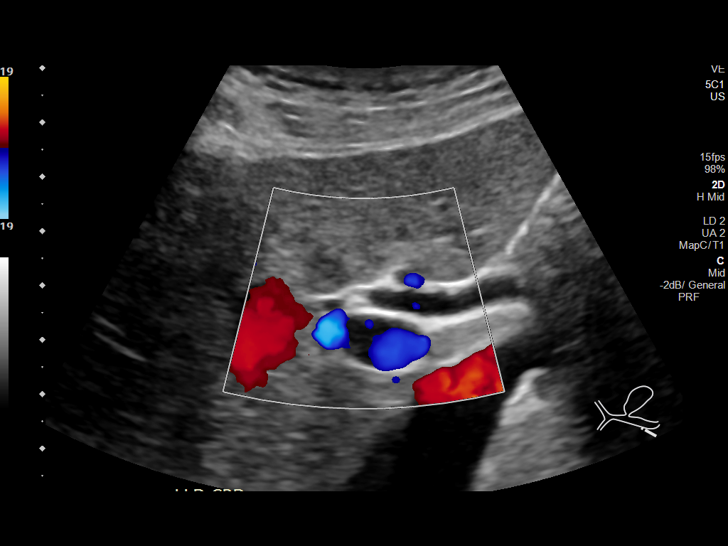
[im 18/54]
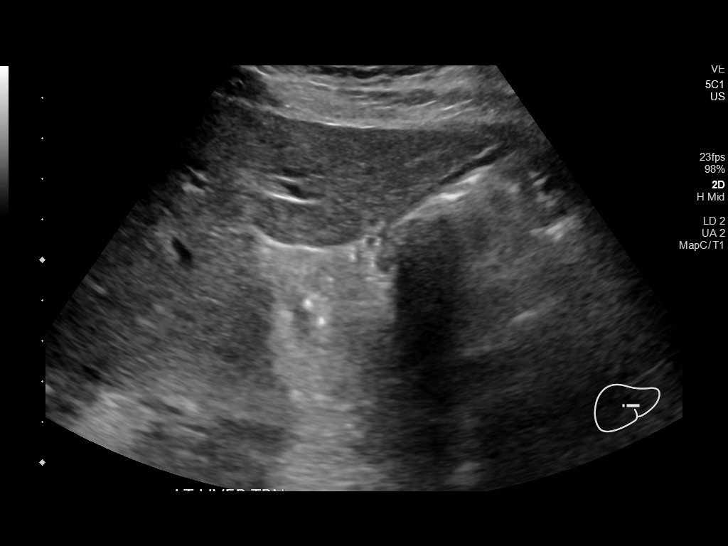
[im 20/54]
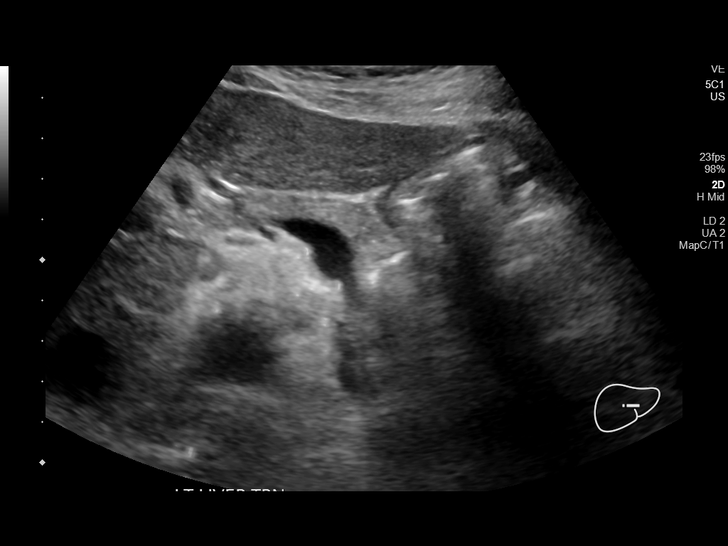
[im 25/54]
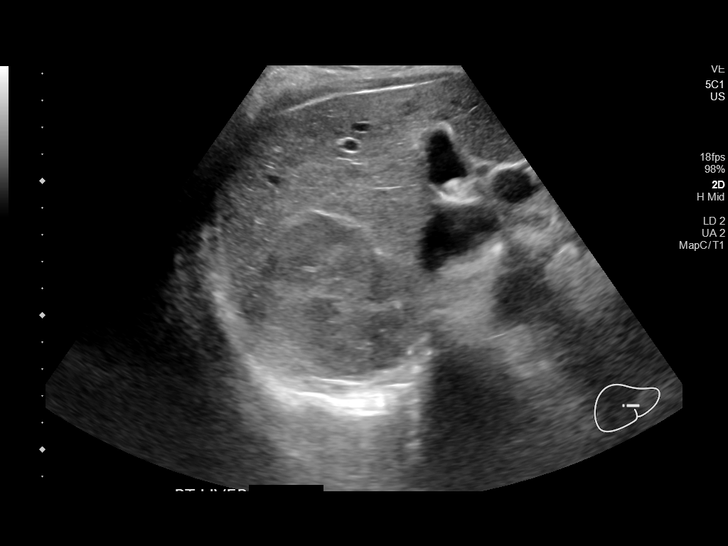
[im 29/54]
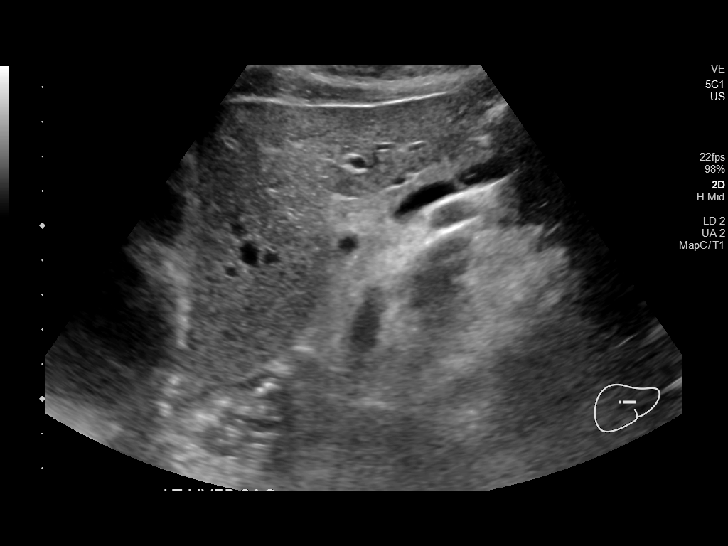
[im 34/54]
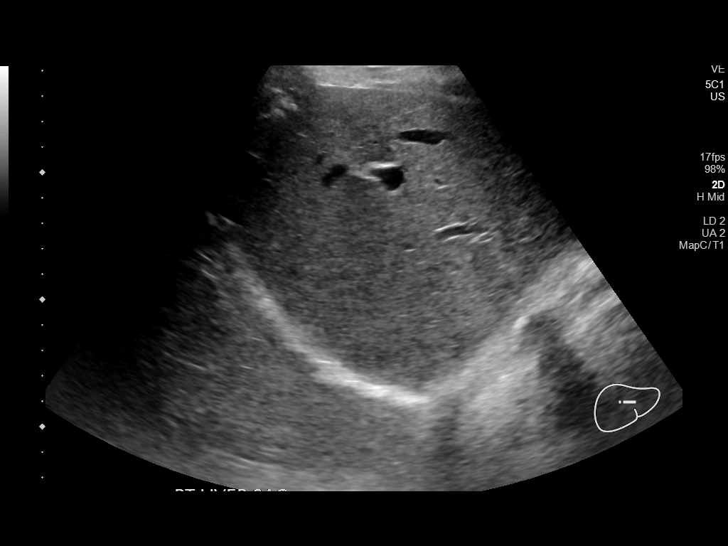
[im 36/54]
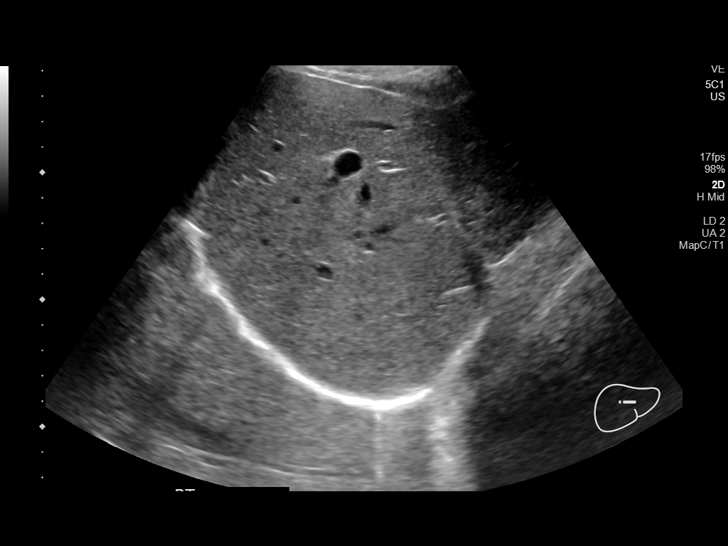
[im 40/54]
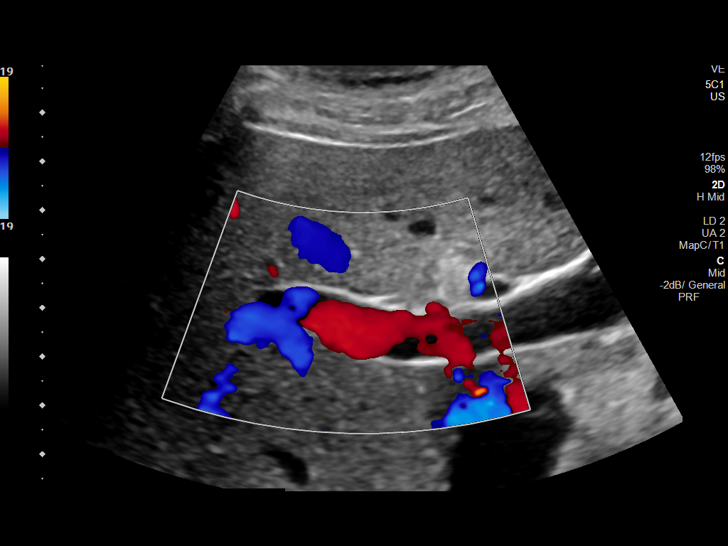
[im 45/54]
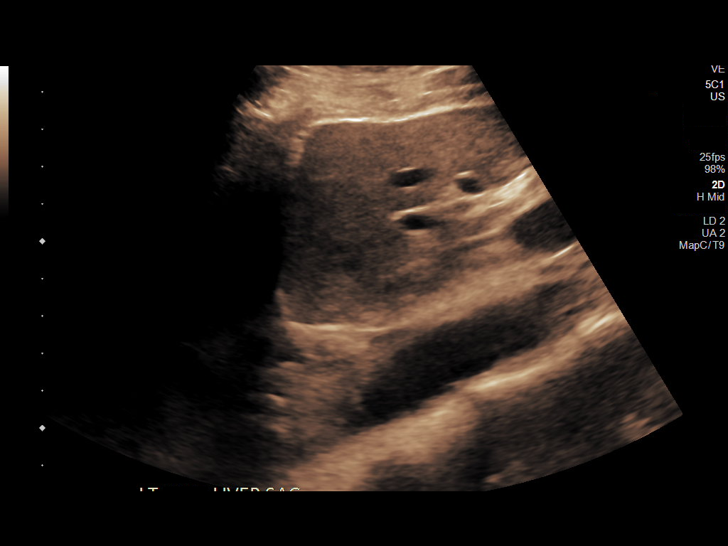
[im 49/54]
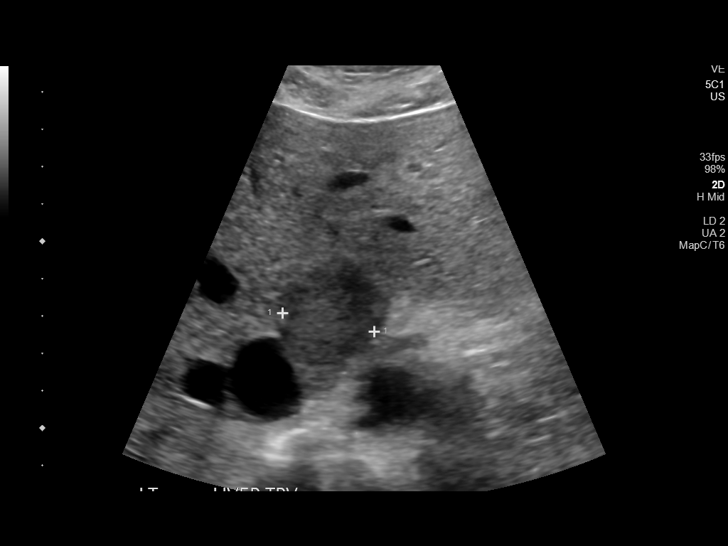
[im 54/54]
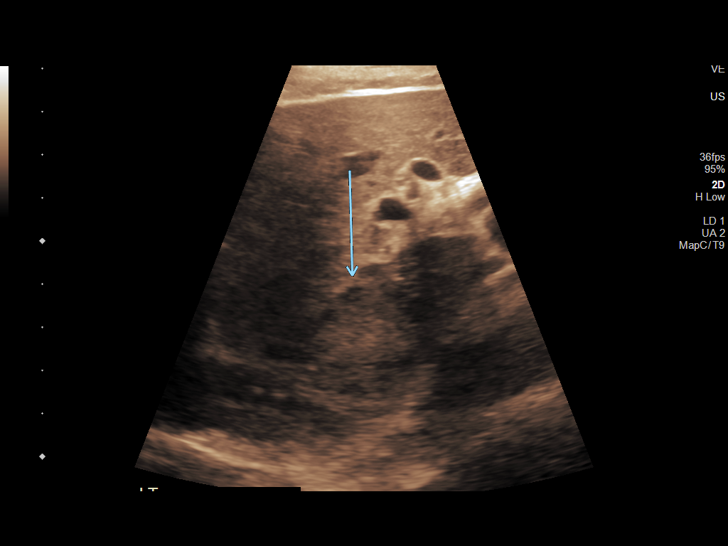

[14 of 25 positions shown; findings below may reference images not displayed]

FINDINGS: Gallbladder:

Probable 6 mm calculus is noted. No gallbladder wall thickening or
pericholecystic fluid is noted. No sonographic Murphy's sign is
noted.

Common bile duct:

Diameter: 5 mm which is within normal limits.

Liver:

2.6 cm solid lesion is seen in the region of the caudate lobe which
corresponds to metastatic lesion seen on prior MRI. The other
metastatic lesions noted on prior MRI are not well visualized on
this study. Within normal limits in parenchymal echogenicity.
Portal vein is patent on color Doppler imaging with normal direction
of blood flow towards the liver.

Other: None.
IMPRESSION: 2.6 cm solid lesion seen in the caudate lobe of the liver consistent
with metastatic disease as described on prior MRI.

Cholelithiasis is noted without evidence of cholecystitis.

## 2020-09-04 MED ORDER — SODIUM CHLORIDE 0.9 % IV SOLN
Freq: Once | INTRAVENOUS | Status: AC
  Filled 2020-09-04: qty 250

## 2020-09-04 MED ORDER — AZITHROMYCIN 250 MG PO TABS: ORAL_TABLET | ORAL | 0 refills | Status: DC

## 2020-09-04 MED ORDER — MAGIC MOUTHWASH: 5.0000 mL | Freq: Four times a day (QID) | ORAL | 0 refills | Status: DC | PRN

## 2020-09-04 NOTE — Progress Notes (Addendum)
Symptoms Management Clinic Progress Note   Erica Wood [id number] [birth year] 54 y.o.  Erica Wood is managed by Dr. Lurline Del  Actively treated with chemotherapy/immunotherapy/hormonal therapy: yes  Current therapy: TCHP.  Last treated: 08/31/2020 (cycle # 1, day # 1)  Next scheduled appointment with provider: 09/08/2020  Assessment: Plan:    Malignant neoplasm of lower-outer quadrant of right breast of female, estrogen receptor positive ([place])  Other constipation - Plan: DG Abd 2 Views, 0.9 %  sodium chloride infusion  Sore throat - Plan: magic mouthwash SOLN, azithromycin (ZITHROMAX Z-PAK) 250 MG tablet  History of DVT (deep vein thrombosis)  Acute pulmonary embolism, unspecified pulmonary embolism type, unspecified whether acute cor pulmonale present ([place])   ER positive malignant neoplasm of the right breast: The patient is status post cycle 1, day 1 of TCHP which was dosed on 08/31/2020.  She is scheduled to be seen next on 09/08/2020.   Constipation: The patient was sent for a KUB which returned showing:  FINDINGS: Fair amount of stool is seen in the colon.  No small bowel dilatation.  No unexpected radiopaque calculi.  IMPRESSION: Fair amount of stool in the colon is indicative of constipation.  She was given information sheet regarding the management of constipation.   Sore throat with erythema of the posterior pharynx: The patient was given a prescription for a Z-Pak.  Additionally she was given a prescription for Magic mouthwash.   History of a pulmonary emboli and DVT with patient on anticoagulation with Coumadin: The patient was instructed to contact her provider that manages her Coumadin to make them aware that she is currently on azithromycin in case they wish to check her PT/INR sooner.  Please see After Visit Summary for patient specific instructions.  Future Appointments  Date Time Provider [place]  09/05/2020   7:10 AM GI-315 MR 1 GI-315MRI GI-315 W. [facility]  09/05/2020  8:00 AM GI-315 MR 1 GI-315MRI GI-315 W. [facility]  09/05/2020  8:50 AM GI-315 MR 1 GI-315MRI GI-315 W. [facility]  09/05/2020 10:20 AM GI-BCG MM IR 1 GI-BCGMM GI-BREAST CE  09/08/2020 11:15 AM [facility]-ONC LAB [facility] None  09/08/2020 11:30 AM [facility] [place] FLUSH [facility] None  09/08/2020 12:00 PM Magrinat, Virgie Dad, MD [facility]MED[facility] None  09/11/2020  8:00 AM MC-SCREENING MC-SDSC None  09/14/2020 12:00 PM WL-NM INJ 1 WL-NM [place]  09/14/2020 12:30 PM WL-CT 2 WL-CT [place]  09/14/2020  3:00 PM WL-NM 1 WL-NM [place]  09/21/2020  7:45 AM [facility]ON[facility] LAB [facility]MED[facility] None  09/21/2020  8:00 AM [facility] [place] FLUSH [facility] None  09/21/2020  9:00 AM [facility] INFUSION [facility] None  09/23/2020  9:30 AM [facility] INFUSION [facility] None  10/12/2020  8:15 AM [facility] [place] FLUSH [facility] None  10/12/2020  9:00 AM [facility] INFUSION [facility]MED[facility] None  10/14/2020  9:30 AM [facility] INFUSION [facility] None  11/02/2020  8:45 AM [facility]MED-ONC LAB [facility] None  11/02/2020  9:00 AM [facility] [place] FLUSH [facility] None  11/02/2020  9:30 AM Magrinat, Virgie Dad, MD [facility] None  11/02/2020 10:00 AM [facility]MED[facility] INFUSION [facility] None  12/28/2020  1:15 PM Deveshwar, Abel [place], MD CR-[facility] None    Orders Placed This Encounter  Procedures  . DG Abd 2 Views       Subjective:   Patient ID:  Erica Wood is a 54 y.o. (DOB February 09, 1966) female.  Chief Complaint: No chief complaint on file.   HPI Erica Wood is a 54 y.o. female with a diagnosis of an ER positive  malignant neoplasm of the right breast.  She is followed by Dr. Jana Hakim and is status post cycle 1, day 1 of TCHP which was dosed on 08/31/2020.  She presents to the clinic today with a 6-day history of constipation.  The patient has an established history of constipation since childhood.  She had even had an episode of a bowel perforation due to severe constipation.   She reports that she has not been drinking as much fluid as she typically does since her treatment.  She reports that she has a sore throat and some swollen lymph nodes in her neck but otherwise denies fevers, chills, sweats, headaches, nausea, or vomiting.  She was referred for a KUB which returned showing:  FINDINGS: Fair amount of stool is seen in the colon.  No small bowel dilatation.  No unexpected radiopaque calculi.  IMPRESSION: Fair amount of stool in the colon is indicative of constipation.    Medications: I have reviewed the patient's current medications.  Allergies:  Allergies  Allergen Reactions  . Beef-Derived Products   . Latex Rash  . Sulfa Antibiotics Rash    Past Medical History:  Diagnosis Date  . Anti-cardiolipin antibody positive   . Anti-cardiolipin antibody syndrome (HCC)   . Anxiety   . Arthralgia   . Arthritis    bil knees, hands  . Atypical chest pain   . Autoimmune disease ([place])   . Cancer ([place]) 07/2020   right breast IDC  . Chronic fatigue   . Depression   . DVT (deep venous thrombosis) ([place])   . IBS (irritable bowel syndrome)    with constipation  . Lupus ([place])   . Pulmonary embolus ([place])   . Raynaud's syndrome   . Sicca ([place])   . Thrombocytopenia ([place])     Past Surgical History:  Procedure Laterality Date  . ABLATION  12/2018   leg veins  . ABLATION Right 08/2019   venous leg   . APPENDECTOMY    . COLON SURGERY     Perforated colon repair after colostomy  . GANGLION CYST EXCISION    . HYSTEROSCOPY WITH D & C N/A 01/13/2014   Procedure: DILATATION AND CURETTAGE /HYSTEROSCOPY with resection ;  Surgeon: Olga Millers, MD;  Location: [facility];  Service: Gynecology;  Laterality: N/A;  . LAPAROTOMY      Family History  Problem Relation Age of Onset  . Rheum arthritis Father   . Hemachromatosis Father   . Cancer Sister        bone     Social History   Socioeconomic History  . Marital status: Widowed    Spouse name: Not on  file  . Number of children: Not on file  . Years of education: Not on file  . Highest education level: Not on file  Occupational History  . Not on file  Tobacco Use  . Smoking status: Former Smoker    Packs/day: 0.50    Years: 5.00    Pack years: 2.50    Types: Cigarettes    Start date: 08/06/2006    Quit date: 08/07/2011    Years since quitting: 9.0  . Smokeless tobacco: Never Used  Vaping Use  . Vaping Use: Never used  Substance and Sexual Activity  . Alcohol use: Yes    Comment: occasionally  . Drug use: No  . Sexual activity: Yes    Birth control/protection: Pill  Other Topics Concern  . Not on file  Social History Narrative  . Not  on file   Social Determinants of Health   Financial Resource Strain:   . Difficulty of Paying Living Expenses: Not on file  Food Insecurity:   . Worried About Charity fundraiser in the Last Year: Not on file  . Ran Out of Food in the Last Year: Not on file  Transportation Needs:   . Lack of Transportation (Medical): Not on file  . Lack of Transportation (Non-Medical): Not on file  Physical Activity:   . Days of Exercise per Week: Not on file  . Minutes of Exercise per Session: Not on file  Stress:   . Feeling of Stress : Not on file  Social Connections:   . Frequency of Communication with Friends and Family: Not on file  . Frequency of Social Gatherings with Friends and Family: Not on file  . Attends Religious Services: Not on file  . Active Member of Clubs or Organizations: Not on file  . Attends Archivist Meetings: Not on file  . Marital Status: Not on file  Intimate Partner Violence:   . Fear of Current or Ex-Partner: Not on file  . Emotionally Abused: Not on file  . Physically Abused: Not on file  . Sexually Abused: Not on file    Past Medical History, Surgical history, Social history, and Family history were reviewed and updated as appropriate.   Please see review of systems for further details on the patient's  review from today.   Review of Systems:  Review of Systems  Constitutional: Negative for appetite change, chills, diaphoresis, fever and unexpected weight change.  HENT: Positive for sore throat. Negative for trouble swallowing.   Respiratory: Negative for cough and shortness of breath.   Cardiovascular: Negative for chest pain and palpitations.  Gastrointestinal: Positive for constipation. Negative for abdominal distention, abdominal pain, anal bleeding, blood in stool, diarrhea, nausea, rectal pain and vomiting.  Musculoskeletal: Negative for arthralgias and myalgias.  Skin: Negative for rash.  Neurological: Negative for dizziness, weakness, light-headedness and headaches.  Hematological: Positive for adenopathy.    Objective:   Physical Exam:  BP 114/67 (BP Location: Left Arm, Patient Position: Sitting)   Pulse 66   Temp 97.8 F (36.6 C) (Tympanic)   Resp 18   Ht 5\' 4"  (1.626 m)   Wt 110 lb 8 oz (50.1 kg)   SpO2 100%   BMI 18.97 kg/m  ECOG: 0  Physical Exam Constitutional:      General: She is not in acute distress.    Appearance: She is not diaphoretic.  HENT:     Head: Normocephalic and atraumatic.     Right Ear: Tympanic membrane, ear canal and external ear normal.     Left Ear: Tympanic membrane, ear canal and external ear normal.     Mouth/Throat:     Mouth: Mucous membranes are moist.     Pharynx: Posterior oropharyngeal erythema present. No oropharyngeal exudate.  Eyes:     General: No scleral icterus.       Right eye: No discharge.        Left eye: No discharge.     Conjunctiva/sclera: Conjunctivae normal.  Cardiovascular:     Rate and Rhythm: Normal rate and regular rhythm.     Heart sounds: Normal heart sounds. No murmur heard.  No friction rub. No gallop.   Pulmonary:     Effort: Pulmonary effort is normal. No respiratory distress.     Breath sounds: Normal breath sounds. No wheezing or rales.  Abdominal:  General: Bowel sounds are normal. There  is no distension.     Palpations: Abdomen is soft. There is no mass.     Tenderness: There is no abdominal tenderness. There is no guarding or rebound.  Musculoskeletal:     Cervical back: Neck supple. Tenderness present.     Right lower leg: No edema.     Left lower leg: No edema.  Lymphadenopathy:     Cervical: Cervical adenopathy present.  Skin:    General: Skin is warm and dry.  Neurological:     Mental Status: She is alert.     Coordination: Coordination normal.     Gait: Gait normal.  Psychiatric:        Mood and Affect: Mood normal.        Behavior: Behavior normal.        Thought Content: Thought content normal.        Judgment: Judgment normal.     Lab Review:     Component Value Date/Time   NA 138 09/04/2020 0919   NA 137 07/28/2020 1108   K 4.1 09/04/2020 0919   CL 105 09/04/2020 0919   CO2 30 09/04/2020 0919   GLUCOSE 98 09/04/2020 0919   BUN 18 09/04/2020 0919   BUN 8 07/28/2020 1108   CREATININE 0.72 09/04/2020 0919   CALCIUM 8.9 09/04/2020 0919   PROT 6.2 (L) 09/04/2020 0919   PROT 7.3 07/28/2020 1108   ALBUMIN 3.7 09/04/2020 0919   ALBUMIN 4.9 07/28/2020 1108   AST 46 (H) 09/04/2020 0919   ALT 52 (H) 09/04/2020 0919   ALKPHOS 53 09/04/2020 0919   BILITOT 0.9 09/04/2020 0919   GFRNONAA >60 09/04/2020 0919   GFRAA 95 07/28/2020 1108       Component Value Date/Time   WBC 16.8 (H) 09/04/2020 0919   WBC 6.0 01/11/2014 0910   RBC 4.59 09/04/2020 0919   HGB 13.7 09/04/2020 0919   HGB 14.3 07/28/2020 1108   HCT 41.6 09/04/2020 0919   HCT 41.4 07/28/2020 1108   PLT 135 (L) 09/04/2020 0919   PLT 202 07/28/2020 1108   MCV 90.6 09/04/2020 0919   MCV 89 07/28/2020 1108   MCH 29.8 09/04/2020 0919   MCHC 32.9 09/04/2020 0919   RDW 11.6 09/04/2020 0919   RDW 10.6 (L) 07/28/2020 1108   LYMPHSABS 1.0 09/04/2020 0919   LYMPHSABS 1.9 07/28/2020 1108   MONOABS 0.0 (L) 09/04/2020 0919   EOSABS 0.0 09/04/2020 0919   EOSABS 0.0 07/28/2020 1108   BASOSABS  0.0 09/04/2020 0919   BASOSABS 0.1 07/28/2020 1108   -------------------------------  Imaging from last 24 hours (if applicable):  Radiology interpretation: MR ABDOMEN WWO CONTRAST  Result Date: 08/24/2020 CLINICAL DATA:  Indeterminate liver lesions on recent CT. Right breast carcinoma. EXAM: MRI ABDOMEN WITHOUT AND WITH CONTRAST TECHNIQUE: Multiplanar multisequence MR imaging of the abdomen was performed both before and after the administration of intravenous contrast. CONTRAST:  50mL MULTIHANCE GADOBENATE DIMEGLUMINE 529 MG/ML IV SOLN COMPARISON:  CT on 07/14/2020 FINDINGS: Lower chest: No acute findings. Hepatobiliary: Multiple hypovascular masses are now seen in the caudate, right, and left hepatic lobes. The largest in the caudate lobe measures 2.3 x 1.9 cm. The 2nd largest mass is seen in the anterior liver near the junction of the right and left lobes on image 17/17, which measures 1.2 x 1.2 cm. These findings are highly suspicious for liver metastases. A tiny less than 5 mm gallstone is noted, however there is no evidence of  cholecystitis or biliary ductal dilatation. Pancreas:  No mass or inflammatory changes. Spleen:  Within normal limits in size and appearance. Adrenals/Urinary Tract: No masses identified. No evidence of hydronephrosis. Stomach/Bowel: Visualized portion unremarkable. Vascular/Lymphatic: No pathologically enlarged lymph nodes identified. No abdominal aortic aneurysm. Other:  None. Musculoskeletal:  No suspicious bone lesions identified. IMPRESSION: Multiple hypovascular masses in the caudate, right, and left hepatic lobes, highly suspicious for liver metastases. No other sites of abdominal metastatic disease identified. Tiny gallstone. No radiographic evidence of cholecystitis or biliary ductal dilatation. Electronically Signed   By: Marlaine Hind M.D.   On: 08/24/2020 16:08   MR BREAST BILATERAL W WO CONTRAST INC CAD  Result Date: 08/14/2020 CLINICAL DATA:  Known  biopsy-proven RIGHT breast cancer (08/08/2020). LABS:  Not applicable EXAM: BILATERAL BREAST MRI WITH AND WITHOUT CONTRAST TECHNIQUE: Multiplanar, multisequence MR images of both breasts were obtained prior to and following the intravenous administration of 5 ml of Gadavist Three-dimensional MR images were rendered by post-processing of the original MR data on an independent workstation. The three-dimensional MR images were interpreted, and findings are reported in the following complete MRI report for this study. Three dimensional images were evaluated at the independent interpreting workstation using the DynaCAD thin client. COMPARISON:  Previous exams.  No previous breast MRI. FINDINGS: Breast composition: d. Extreme fibroglandular tissue. Background parenchymal enhancement: Moderate. Right breast: Irregular enhancing mass within the lower RIGHT breast, 6 o'clock axis, measuring 2.6 x 2.2 x 2.5 cm (craniocaudal by transverse by AP dimensions), with associated biopsy clip artifact, corresponding to patient's known biopsy-proven malignancy. The mass is superficial, underlying the skin of the lower RIGHT breast. Previous ultrasound showed probable skin involvement. Additional oval circumscribed enhancing mass within the lower inner quadrant of the RIGHT breast, at posterior depth, 4 o'clock axis region, measuring 5 mm (axial series 9, image 94). Additional irregular enhancing mass within the upper inner quadrant of the RIGHT breast, at middle depth, measuring 1.2 x 0.7 cm (best seen on sagittal reformatted series 100, images 194 through 197; axial series 12, images 56 through 58). Additional irregular enhancing mass within the upper-outer quadrant of the RIGHT breast, at anterior depth, measuring 7 mm (axial series 12, image 61; sagittal reformatted series 100, image 224). Additional biopsy clip artifact within the posterior RIGHT breast corresponding to the site of benign stereotactic biopsy. Left breast: No  suspicious enhancing mass, non-mass enhancement or secondary signs of malignancy. Lymph nodes: No enlarged or morphologically abnormal lymph nodes are identified within either axillary region or within the internal mammary chain regions. Ancillary findings:  Heterogeneous enhancement of the sternum. IMPRESSION: 1. Known biopsy-proven invasive ductal carcinoma of the RIGHT breast, 6 o'clock axis, measuring 2.6 cm, with associated biopsy clip. This mass is superficial, underlying the skin of the lower RIGHT breast. Previous ultrasound showed probable skin involvement. 2. Additional oval circumscribed enhancing mass within the lower inner quadrant of the RIGHT breast, at posterior depth, measuring 5 mm. 3. Additional irregular enhancing mass within the upper inner quadrant of the RIGHT breast, at middle depth, measuring 1.2 cm. 4. Additional irregular enhancing mass within the upper-outer quadrant of the RIGHT breast, at anterior depth, measuring 7 mm. 5. Additional biopsy clip artifact within the posterior RIGHT breast, corresponding to a benign stereotactic biopsy site. 6. No evidence of malignancy within the LEFT breast. 7. No evidence of malignant lymphadenopathy. 8. Heterogeneous enhancement of the sternum, of uncertain significance. 9. CT abdomen report of 07/14/2020 describes 2 indeterminate lesions in the liver, largest  measuring 2.2 cm, with abdominal MRI recommended for further characterization. RECOMMENDATION: 1. If breast conservation therapy is being considered for the RIGHT breast, recommend additional MRI guided biopsies for the 3 additional masses in the RIGHT breast as described above. Two masses are located within the inner RIGHT breast and 1 mass is located within the upper-outer quadrant of the RIGHT breast. Therefore, if patient positioning for the biopsies does not allow for all 3 masses to be biopsied on the same day, the 2 masses within the inner RIGHT breast could be biopsied on 1 day and the  additional mass within the upper-outer RIGHT breast could be biopsied on a separate day. 2. MRI of the abdomen for further characterization of 2 indeterminate liver lesions described on CT abdomen report of 07/14/2020. These liver lesions are concerning given the known RIGHT breast cancer. 3. Would also consider PET-CT and/or bone scan given the heterogeneous enhancement of the sternum. BI-RADS CATEGORY  4: Suspicious. Electronically Signed   By: Franki Cabot M.D.   On: 08/14/2020 11:16   DG Abd 2 Views  Result Date: 09/04/2020 CLINICAL DATA:  Constipation, diffuse abdominal pain, breast cancer. EXAM: ABDOMEN - 2 VIEW COMPARISON:  None. FINDINGS: Fair amount of stool is seen in the colon. No small bowel dilatation. No unexpected radiopaque calculi. IMPRESSION: Fair amount of stool in the colon is indicative of constipation. Electronically Signed   By: Lorin Picket M.D.   On: 09/04/2020 10:53   US Abdomen Limited RUQ (LIVER/GB)  Result Date: 09/04/2020 CLINICAL DATA:  History of breast cancer. EXAM: ULTRASOUND ABDOMEN LIMITED RIGHT UPPER QUADRANT COMPARISON:  August 24, 2020.  July 14, 2020. FINDINGS: Gallbladder: Probable 6 mm calculus is noted. No gallbladder wall thickening or pericholecystic fluid is noted. No sonographic Murphy's sign is noted. Common bile duct: Diameter: 5 mm which is within normal limits. Liver: 2.6 cm solid lesion is seen in the region of the caudate lobe which corresponds to metastatic lesion seen on prior MRI. The other metastatic lesions noted on prior MRI are not well visualized on this study. Within normal limits in parenchymal echogenicity. Portal vein is patent on color Doppler imaging with normal direction of blood flow towards the liver. Other: None. IMPRESSION: 2.6 cm solid lesion seen in the caudate lobe of the liver consistent with metastatic disease as described on prior MRI. Cholelithiasis is noted without evidence of cholecystitis. Electronically Signed   By:  Marijo Conception M.D.   On: 09/04/2020 09:20

## 2020-09-04 NOTE — Addendum Note (Signed)
Addended by: Sandi Mealy E on: 09/04/2020 12:17 PM   Modules accepted: Orders

## 2020-09-04 NOTE — Patient Instructions (Signed)

## 2020-09-04 NOTE — Telephone Encounter (Signed)
Pt called reporting constipation X 6 days.  Pt reports not drinking adequate fluids at home.  Denies any fever, nausea, or vomiting.  Reports sore throat.   Pt is s/p D1C1 TCHP.    Pt is scheduled to be evaluated by symptom management today with labs prior.  Pt aware.

## 2020-09-05 ENCOUNTER — Other Ambulatory Visit: Payer: Self-pay | Admitting: General Surgery

## 2020-09-05 ENCOUNTER — Ambulatory Visit
Admission: RE | Admit: 2020-09-05 | Discharge: 2020-09-05 | Disposition: A | Discharge status: Home / Self Care | Payer: 59 | Source: Ambulatory Visit | Attending: General Surgery | Admitting: General Surgery

## 2020-09-05 ENCOUNTER — Encounter (HOSPITAL_BASED_OUTPATIENT_CLINIC_OR_DEPARTMENT_OTHER): Payer: Self-pay | Admitting: General Surgery

## 2020-09-05 ENCOUNTER — Encounter (HOSPITAL_COMMUNITY): Payer: Self-pay

## 2020-09-05 ENCOUNTER — Telehealth: Payer: Self-pay

## 2020-09-05 DIAGNOSIS — C50511 Malignant neoplasm of lower-outer quadrant of right female breast: Secondary | ICD-10-CM

## 2020-09-05 IMAGING — MG MM BREAST LOCALIZATION CLIP
4 series · 4 of 12 positions shown · non-contrast
Comparison: Previous exam(s).

CLINICAL DATA: Status post MRI guided biopsy today of a suspicious
mass within the upper-outer quadrant the RIGHT breast.

Patient had an earlier ultrasound-guided biopsy confirming
malignancy within the RIGHT breast at the 6 o'clock axis. An
additional earlier stereotactic biopsy of the posterior RIGHT breast
revealed a benign pathology result.
EXAM:
DIAGNOSTIC RIGHT MAMMOGRAM POST MRI BIOPSY

[R ML synth-2D]
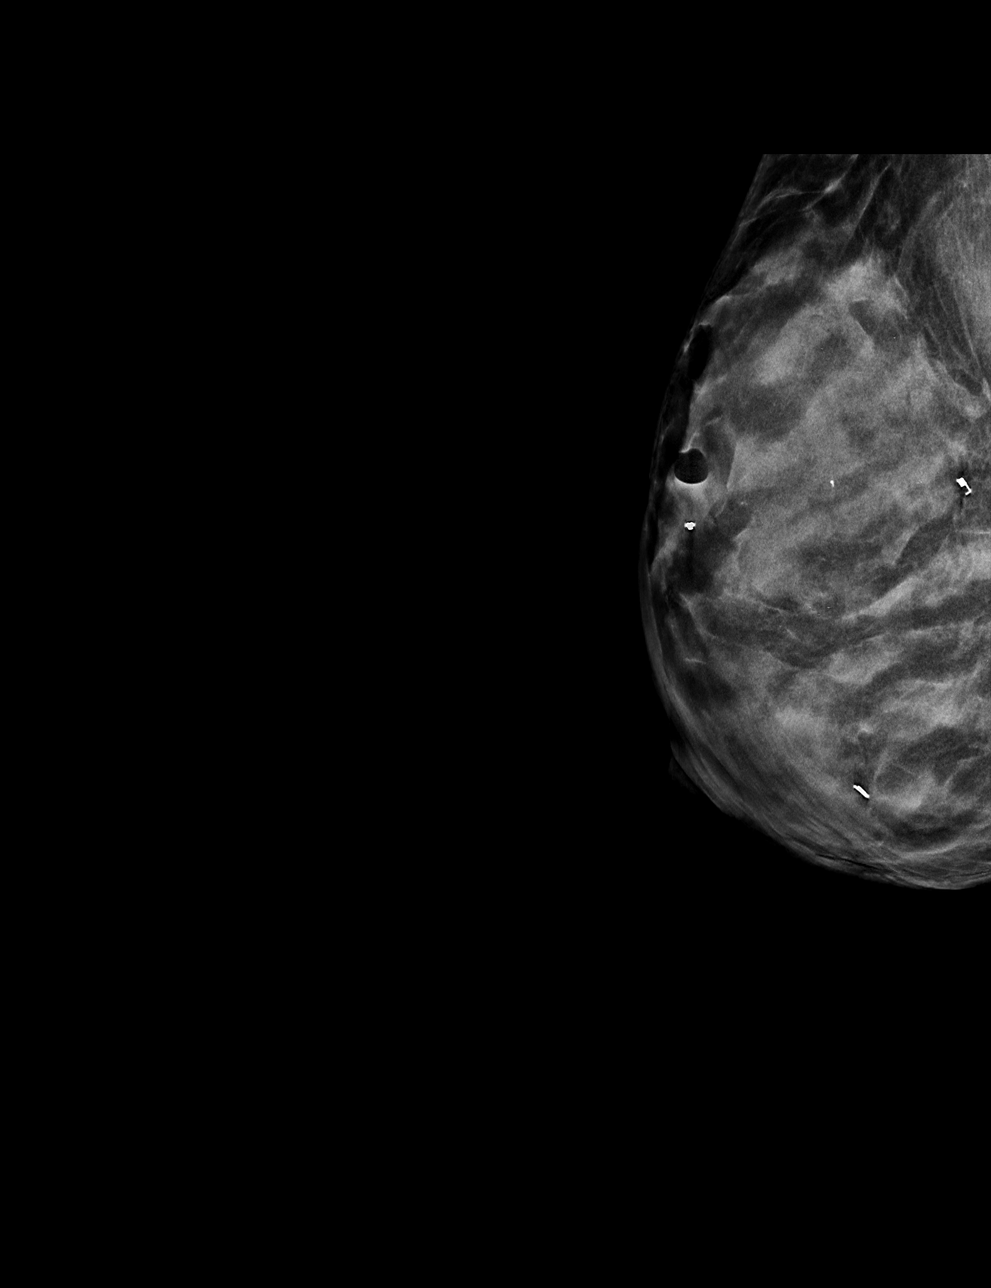

[R CC synth-2D]
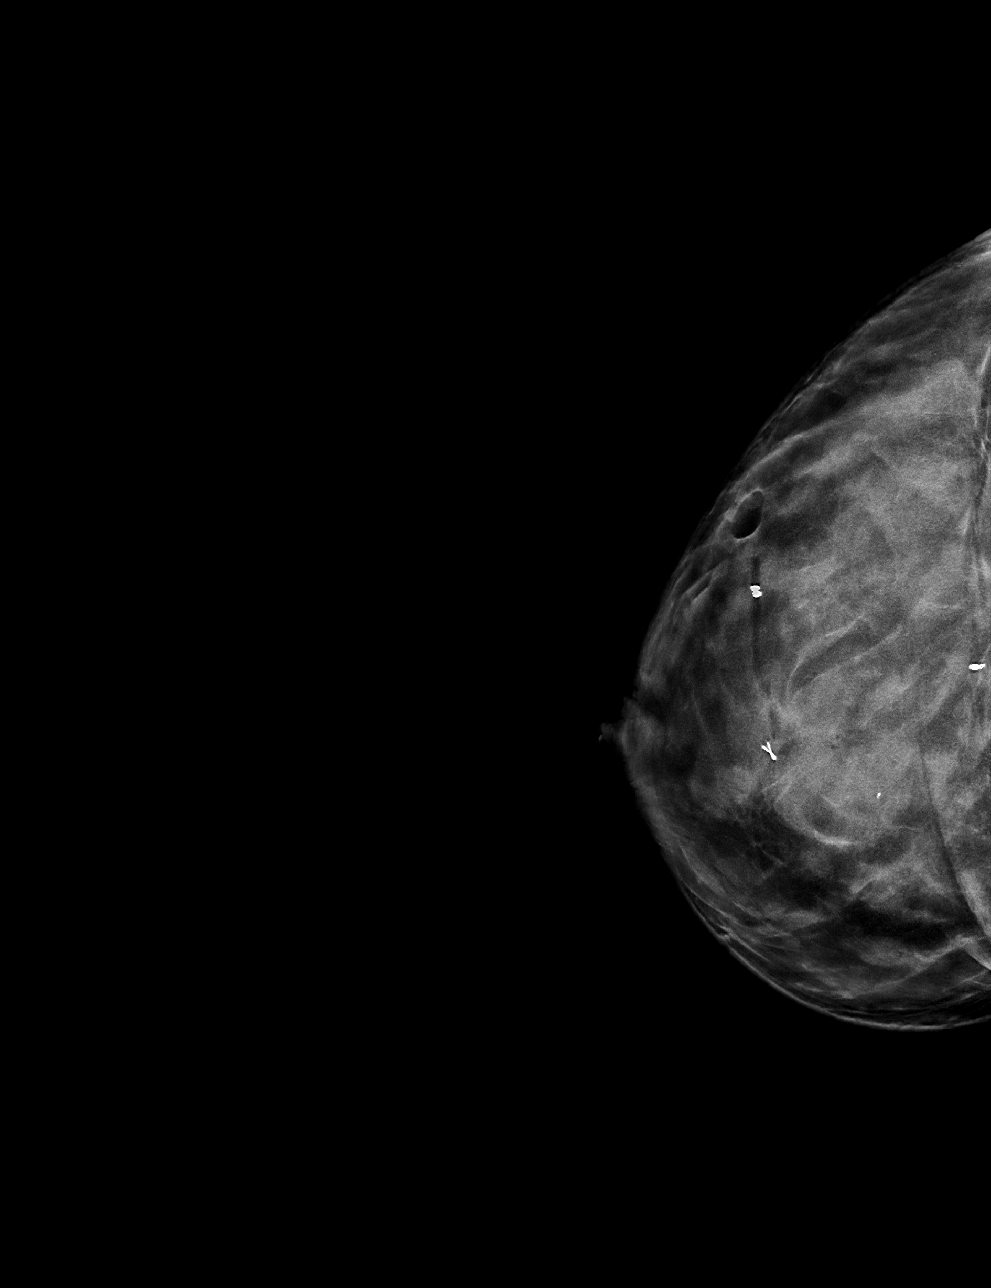

[R ML tomo · tomo slice 29/56.0]
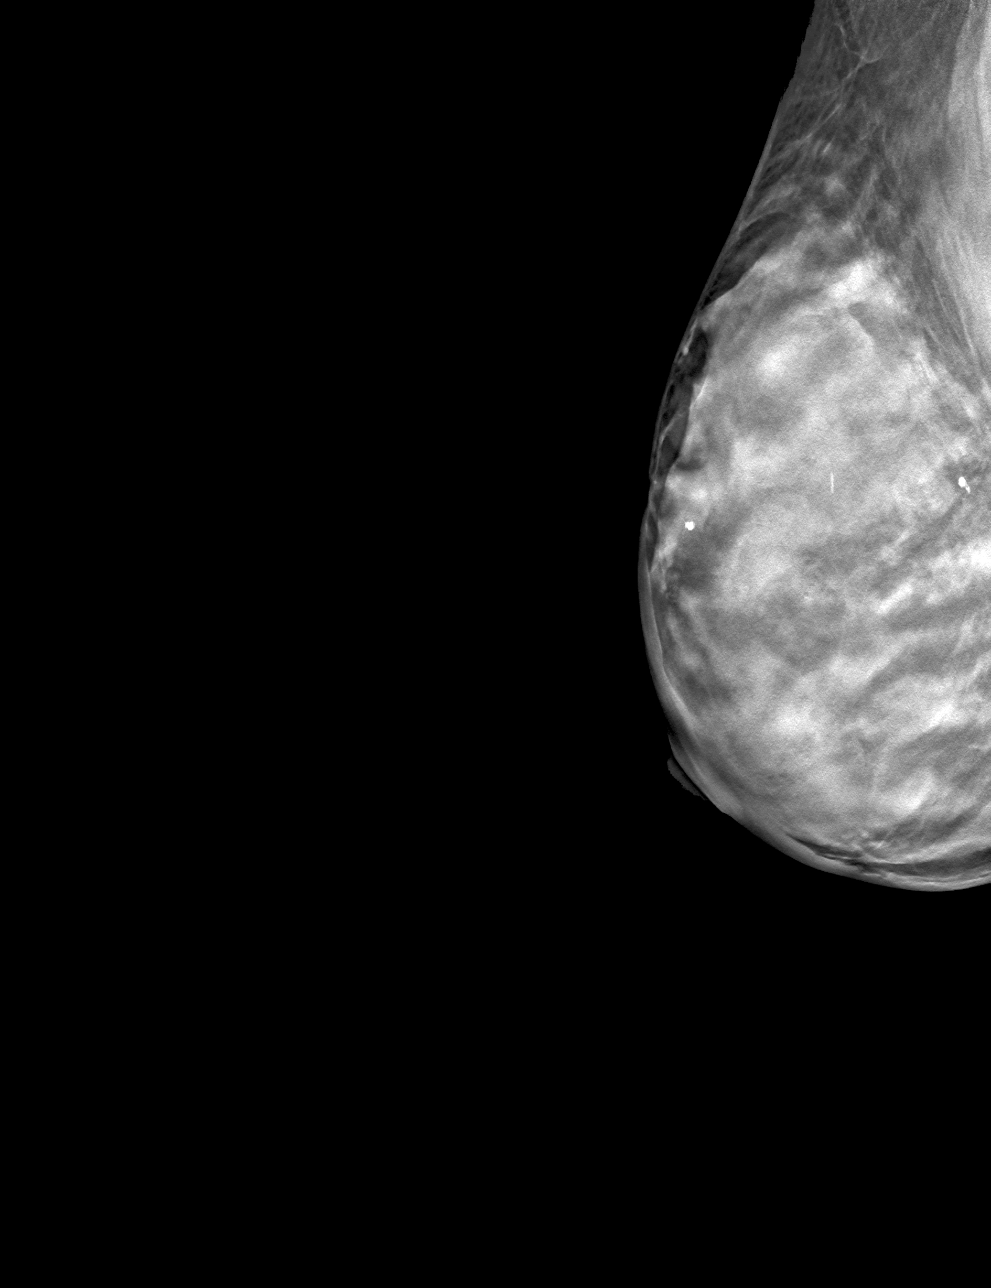

[R CC tomo · tomo slice 33/64.0]
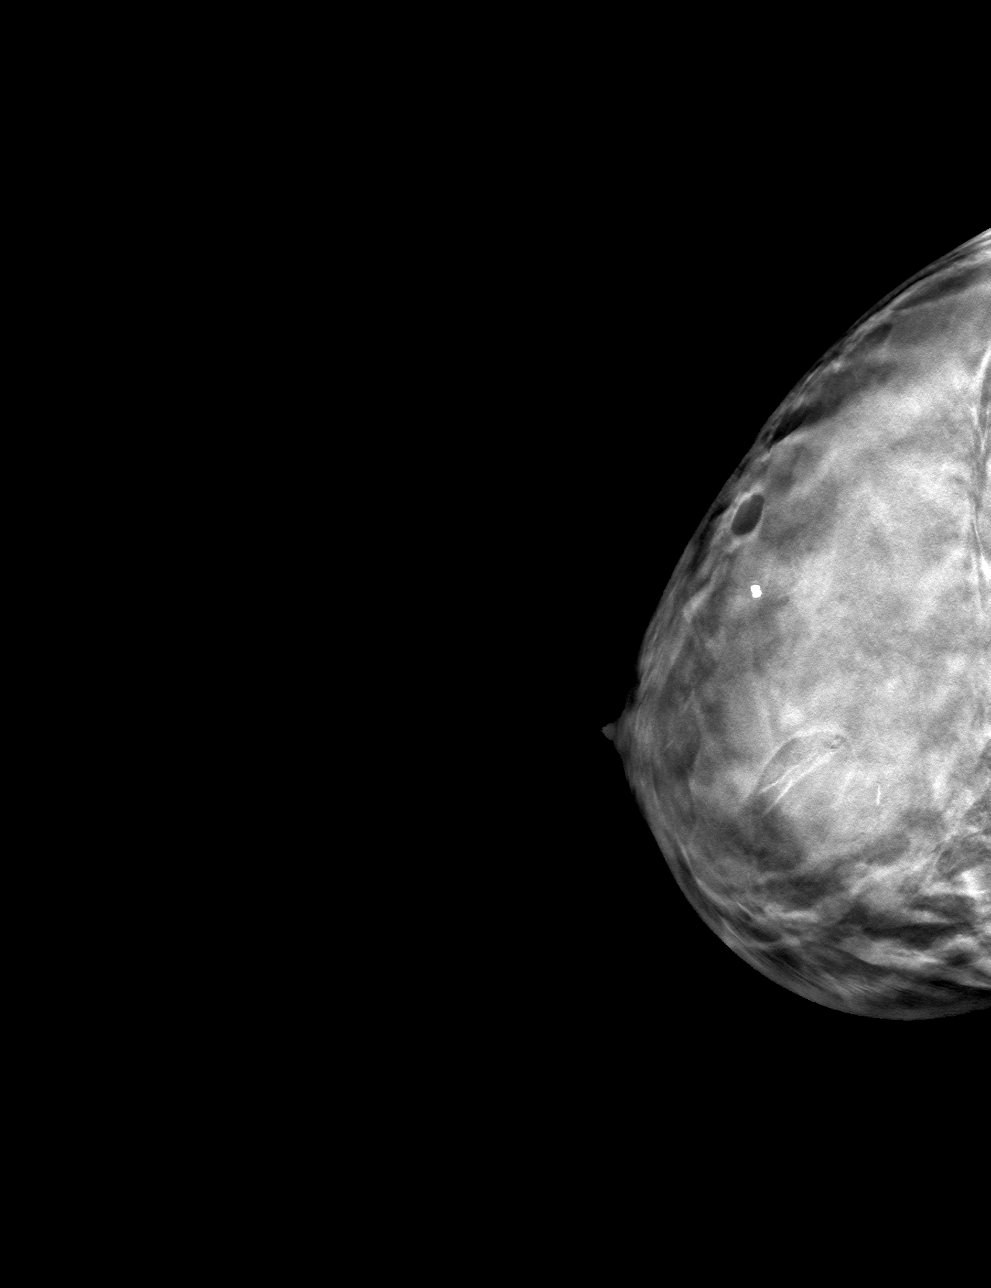

[4 of 12 positions shown; findings below may reference images not displayed]

FINDINGS: Mammographic images were obtained following MRI guided biopsy of the
suspicious mass within the upper-outer quadrant of the RIGHT breast.
The biopsy marking clip is approximately 5 mm inferomedial to the
biopsy site.
IMPRESSION: 1. Today's BARBELL shaped clip is adequately positioned
approximately 5 mm inferomedial to the biopsy cavity.
2. A ribbon shaped clip corresponds to a biopsy-proven cancer within
the RIGHT breast at the 6 o'clock axis.
3. A coil shaped clip within the posterior RIGHT breast corresponds
to a previous benign stereotactic biopsy.

Final Assessment: Post Procedure Mammograms for Marker Placement

## 2020-09-05 IMAGING — MR MR BREAST BX W/ LOC DEV 1ST LEASION IMAGE BX SPEC MR GUIDE*R*
7 of 10 series · 32 of 48 positions shown · IV contrast (5ml gadavist)
Comparison: Previous exams.
COMPARISON: Previous exams.

Addendum:
CLINICAL DATA: Patient with a known biopsy-proven RIGHT breast
cancer at the 6 o'clock axis. Subsequent breast MRI revealing 3
additional suspicious masses within the RIGHT breast, 2 of which are
located within the inner RIGHT breast and the third mass within the
upper-outer quadrant. This mass within the upper-outer quadrant is
farthest from the known cancer so will be biopsied today.

EXAM:
MRI GUIDED CORE NEEDLE BIOPSY OF THE RIGHT BREAST
TECHNIQUE: Multiplanar, multisequence MR imaging of the RIGHT breast was
performed both before and after administration of intravenous
contrast.
CONTRAST:  5mL GADAVIST GADOBUTROL 1 MMOL/ML IV SOLN

[Series 4: fiducial unilateral · sagittal · 2.0mm · 1.33mm/px · 3 of 52 slices shown]
[im 1/52]
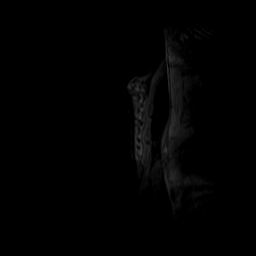
[im 26/52]
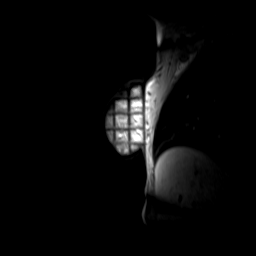
[im 52/52]
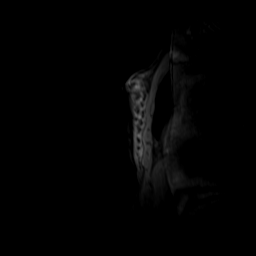

[Series 5: dynamic pre · axial · non-contrast · 1.3mm · 0.73mm/px · z∈[-92,+93]mm · 5 of 144 slices shown]
[im 1/144]
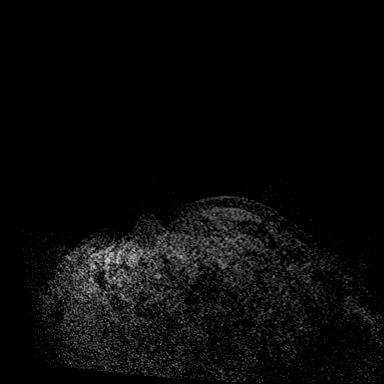
[im 36/144]
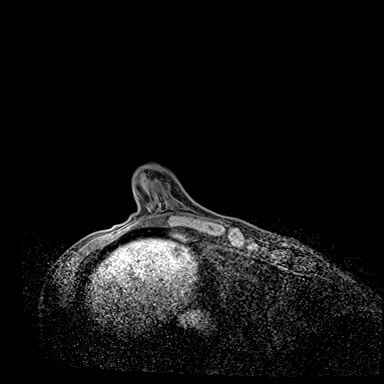
[im 72/144]
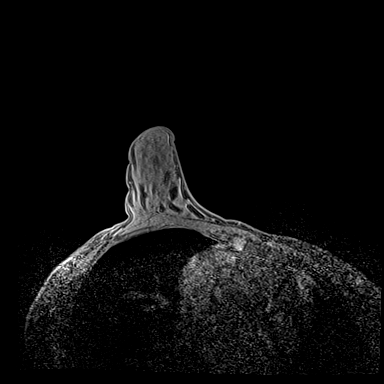
[im 108/144]
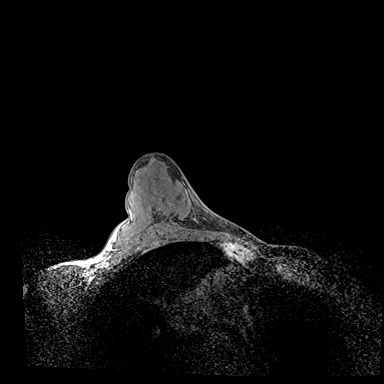
[im 144/144]
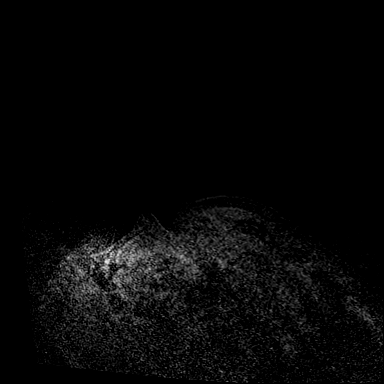

[Series 6: dynamic post 20 · axial · 1.3mm · 0.73mm/px · z∈[-92,+93]mm · 5 of 144 slices shown (1 of 2)]
[im 1/144]
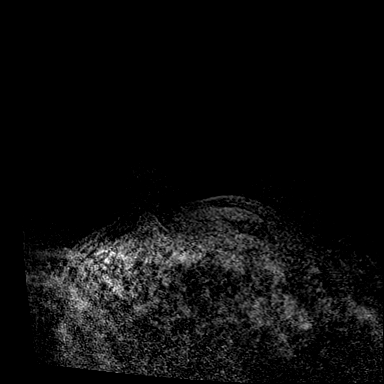
[im 36/144]
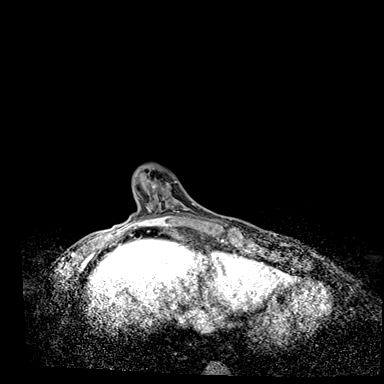
[im 72/144]
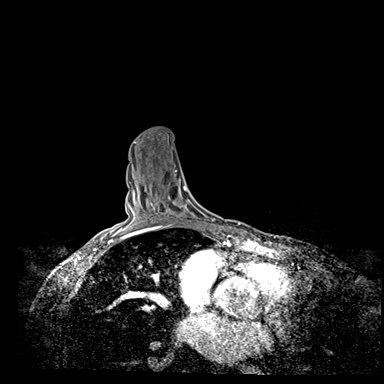
[im 108/144]
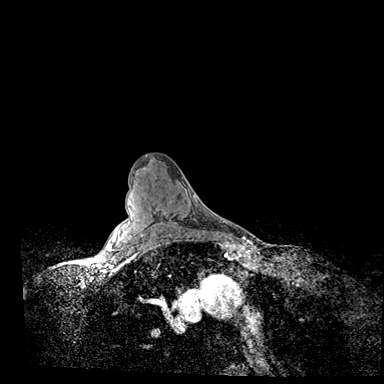
[im 144/144]
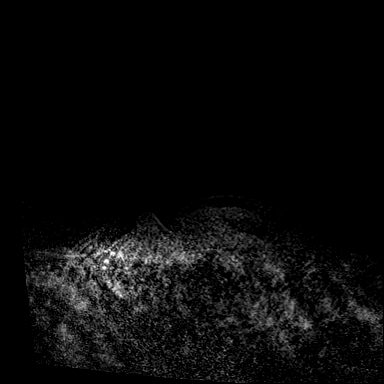

[Series 7: dynamic post 20 · axial · 1.3mm · 0.73mm/px · z∈[-92,+93]mm · 5 of 144 slices shown (2 of 2)]
[im 1/144]
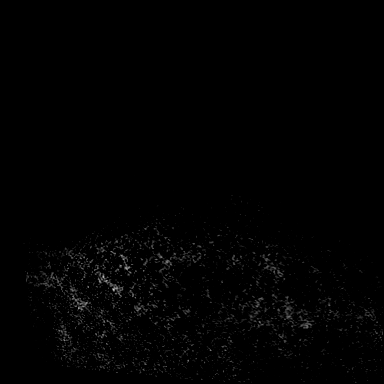
[im 36/144]
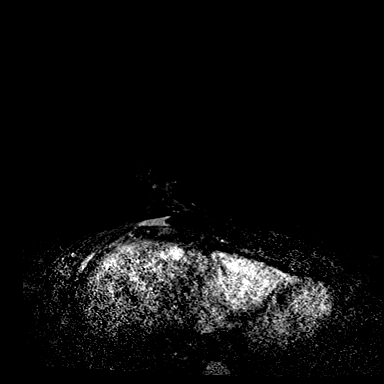
[im 72/144]
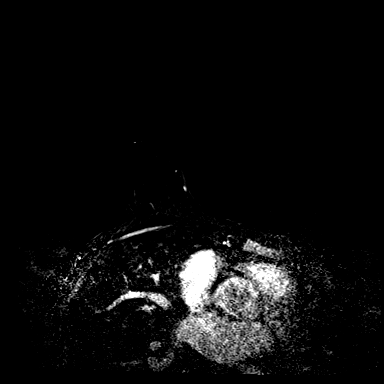
[im 108/144]
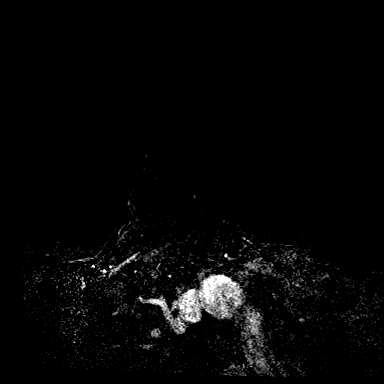
[im 144/144]
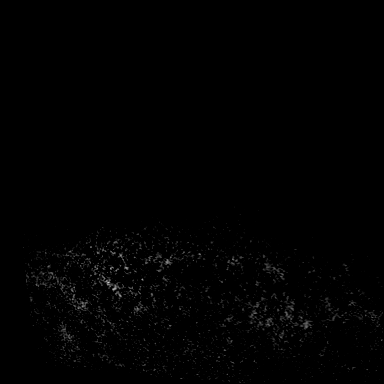

[Series 8: dynamic post 3 · axial · 1.3mm · 0.73mm/px · z∈[-92,+93]mm · 5 of 144 slices shown (1 of 2)]
[im 1/144]
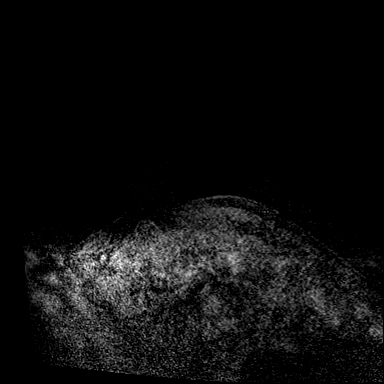
[im 36/144]
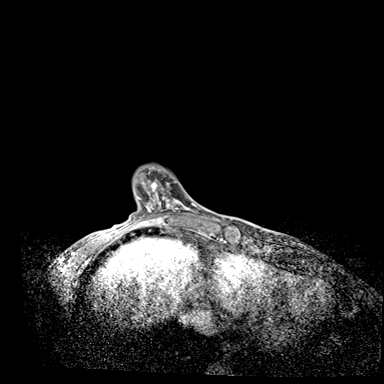
[im 72/144]
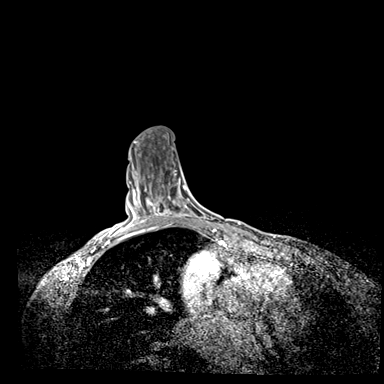
[im 108/144]
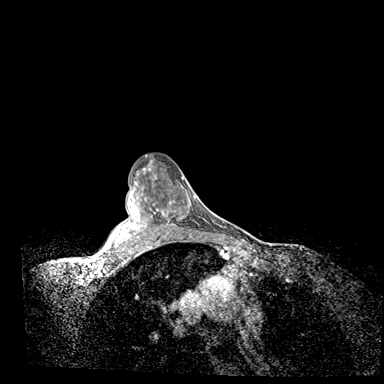
[im 144/144]
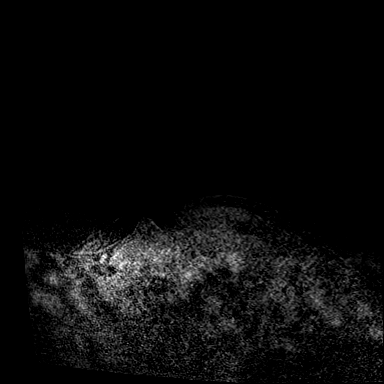

[Series 9: dynamic post 3 · axial · 1.3mm · 0.73mm/px · z∈[-92,+93]mm · 5 of 144 slices shown (2 of 2)]
[im 1/144]
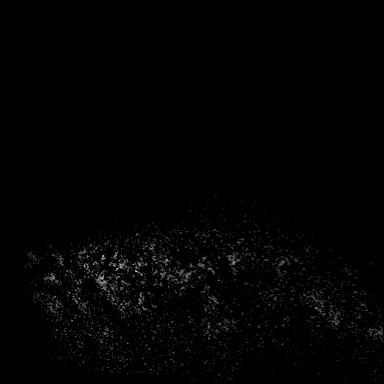
[im 36/144]
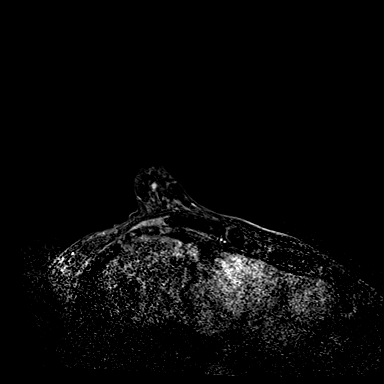
[im 72/144]
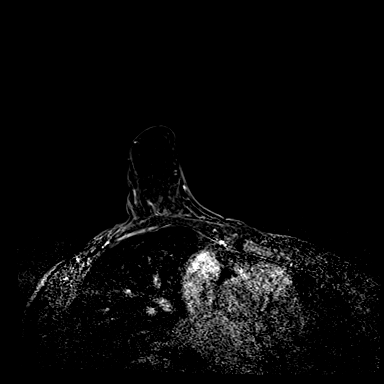
[im 108/144]
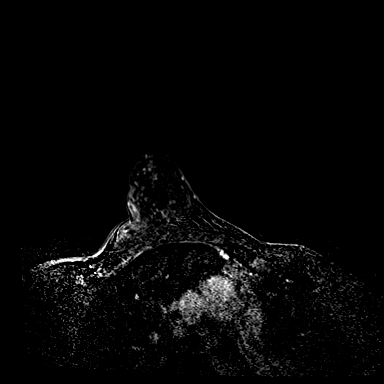
[im 144/144]
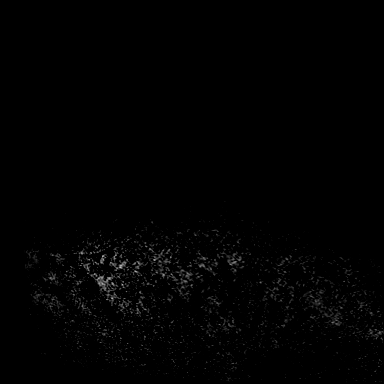

[Series 10: needle confirmation · axial · 1.3mm · 0.73mm/px · z∈[-92,+47]mm · 4 of 144 slices shown]
[im 1/144]
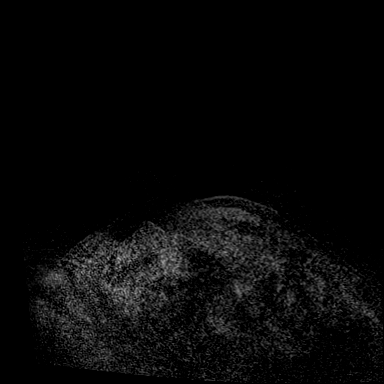
[im 36/144]
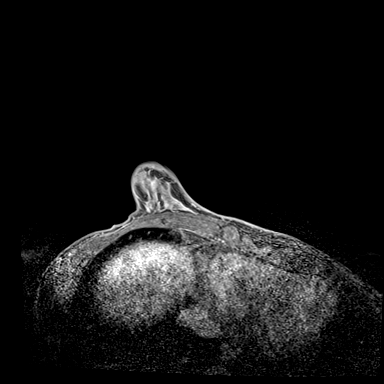
[im 72/144]
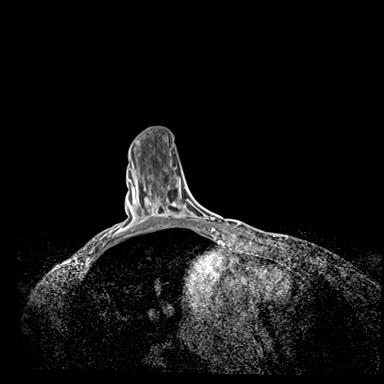
[im 108/144]
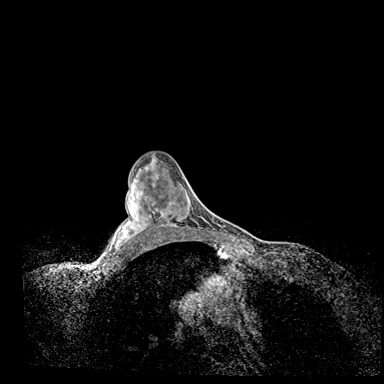

[32 of 48 positions shown; findings below may reference images not displayed]

FINDINGS: I met with the patient, and we discussed the procedure of MRI guided
biopsy, including risks, benefits, and alternatives. Specifically,
we discussed the risks of infection, bleeding, tissue injury, clip
migration, and inadequate sampling. Informed, written consent was
given. The usual time out protocol was performed immediately prior
to the procedure.

Using sterile technique, 1% Lidocaine, MRI guidance, and a 9 gauge
vacuum assisted device, biopsy was performed of the suspicious mass
within the upper-outer quadrant of the RIGHT breast using a lateral
approach. At the conclusion of the procedure, a barbell shaped
tissue marker clip was deployed into the biopsy cavity. Follow-up
2-view mammogram was performed and dictated separately.
IMPRESSION: MRI guided biopsy of the suspicious mass within the upper-outer
quadrant of the RIGHT breast. No apparent complications.

ADDENDUM:
Pathology revealed SCLEROTIC LESION WITH FOCAL LOBULAR NEOPLASIA
(ATYPICAL LOBULAR HYPERPLASIA) od the Right breast, upper outer
quadrant. The differential diagnosis includes a sclerotic
fibroadenoma and sclerotic papilloma. This was found to be
concordant by Dr. DINMA, with excision recommended.

Pathology results were discussed with the patient by telephone. The
patient reported doing well after the biopsy with tenderness at the
site. Post biopsy instructions and care were reviewed and questions
were answered. The patient was encouraged to call The [REDACTED] for any additional concerns. My direct phone
number was provided.

The patient has a recent diagnosis of Right breast cancer and should
follow her outlined treatment plan. Dr. DINMA was
notified of biopsy results via [REDACTED] message on [DATE].

There are 2 additional suspicious masses within the inner Right
breast. If breast conservation is still considered after this
biopsy, the patient will need to be scheduled for the 2 additional
masses in the inner Right breast. Per my conversation with Dr.
DINMA today, additional MRI-guided biopsies will not be
requested/ordered at this time.

Pathology results reported by DINMA, RN on [DATE].

*** End of Addendum ***
FINDINGS: I met with the patient, and we discussed the procedure of MRI guided
biopsy, including risks, benefits, and alternatives. Specifically,
we discussed the risks of infection, bleeding, tissue injury, clip
migration, and inadequate sampling. Informed, written consent was
given. The usual time out protocol was performed immediately prior
to the procedure.

Using sterile technique, 1% Lidocaine, MRI guidance, and a 9 gauge
vacuum assisted device, biopsy was performed of the suspicious mass
within the upper-outer quadrant of the RIGHT breast using a lateral
approach. At the conclusion of the procedure, a barbell shaped
tissue marker clip was deployed into the biopsy cavity. Follow-up
2-view mammogram was performed and dictated separately.
IMPRESSION: MRI guided biopsy of the suspicious mass within the upper-outer
quadrant of the RIGHT breast. No apparent complications.

## 2020-09-05 MED ORDER — GADOBUTROL 1 MMOL/ML IV SOLN
5.0000 mL | Freq: Once | INTRAVENOUS | Status: AC | PRN
  Administered 2020-09-05: 5 mL via INTRAVENOUS

## 2020-09-05 NOTE — Progress Notes (Signed)
Erica Wood Female, 54 y.o., 11-04-1966 MRN:  [id number] Phone:  [phone] [name]) PCP:  Eunice Blase, MD Coverage:  [facility] Other Next Appt With Radiology ([facility]) 09/14/2020 at 12:00 PM  RE: Blood thinner/Biopsy Received: Today Message Details  Hilts, Legrand Como, MD  Lenore Cordia Cc: Magrinat, Virgie Dad, MD Yes, that's fine.   Ronalee Belts   Previous Messages  ----- Message -----  From: Lenore Cordia  Sent: 09/05/2020 11:38 AM EDT  To: Chauncey Cruel, MD, Eunice Blase, MD  Subject: Blood thinner/Biopsy               Hello Dr Junius Roads,   Pt. [name] is scheduled for a Biopsy  she is on the blood thinner Coumadin/warfarin  She will need to hold it 4 days prior to her Biopsy  Permission is needed   Danford Tat

## 2020-09-05 NOTE — Progress Notes (Unsigned)
Louise L. Bastrop Female, 54 y.o., 26-Jun-1966 MRN:  [id number] Phone:  [phone] [name]) PCP:  Eunice Blase, MD Coverage:  [facility] Other Next Appt With Radiology ([facility]) 09/14/2020 at 12:00 PM  RE: Biopsy Received: Today Message Details  Suttle, Rosanne Ashing, MD  Lennox Solders E Approved for CT guided focal liver biopsy with ultrasound available/in room of presumed metastatic breast ca. Challenging biopsy of largest met (caudate), may also be able to visualize/target the most conspicuous met on CT from 07/14/20 (image 17, series 2).   Dylan   Previous Messages  ----- Message -----  From: Lenore Cordia  Sent: 09/05/2020  9:49 AM EDT  To: Ir Procedure Requests  Subject: RE: Biopsy                    [place] Final  ----- Message -----  From: Suzette Battiest, MD  Sent: 08/25/2020  1:37 PM EDT  To: Lenore Cordia  Subject: RE: Biopsy                    Multiple small hepatic lesions best visualized on arterial phase T1 and diffusion sequences, relatively occult on non-enhanced T1 and fluid sequences. Recommend liver ultrasound prior to scheduling for US guided biopsy. If lesions are visible on [place], may proceed with scheduling for US guided biopsy.   Dylan  ----- Message -----  From: Lenore Cordia  Sent: 08/25/2020 11:22 AM EDT  To: Ir Procedure Requests  Subject: Biopsy                      Procedure Requested: US Biopsy (Liver)    Reason for Procedure: evaluate lesions seen on mri   Provider Requesting: Chauncey Cruel   Provider Telephone: [phone]    Other Info:

## 2020-09-05 NOTE — Telephone Encounter (Signed)
Pt called stating throat is not feeling better and states the mag citrate made her throat feel worse. This LPN recommended MOM and a glycerin suppository and to continue magic mouth wash, if it doesn't improve to call us back. Pt verbalized understanding with teachback.

## 2020-09-06 ENCOUNTER — Encounter: Payer: Self-pay | Admitting: *Deleted

## 2020-09-07 ENCOUNTER — Encounter: Payer: Self-pay | Admitting: Family Medicine

## 2020-09-07 ENCOUNTER — Other Ambulatory Visit: Payer: Self-pay | Admitting: Oncology

## 2020-09-07 ENCOUNTER — Telehealth: Payer: Self-pay | Admitting: *Deleted

## 2020-09-07 DIAGNOSIS — Z86718 Personal history of other venous thrombosis and embolism: Secondary | ICD-10-CM

## 2020-09-07 DIAGNOSIS — C50511 Malignant neoplasm of lower-outer quadrant of right female breast: Secondary | ICD-10-CM

## 2020-09-07 MED ORDER — FLUCONAZOLE 100 MG PO TABS
ORAL_TABLET | ORAL | 0 refills | Status: DC
Start: 2020-09-07 — End: 2020-10-25

## 2020-09-07 NOTE — Telephone Encounter (Signed)
Spoke with patient regarding appointments she is requesting with Dr. Jana Hakim and Dr. Donne Hazel.  She is scheduled to see Dr. Jana Hakim tomorrow 10/29 for labs and nadir check.  Patient states she is still having constipation and is having a hard time getting fluids in.  I will see if infusion room has time for IVF's tomorrow after her appointment with Dr. Jana Hakim.  She is taking stool softners and is planning on getting some Miralax today.  She also has some mag citrate but unable to get it down due to it feels like it is burning her mouth. She is using the mouthwash for the pain in her mouth and this seems to be getting a little better. She does state that she some white coating on her tongue.  Informed her I would call in a prescription for Diflucan to help with this.  She is also requesting we add a PT/INR to her labs tomorrow so she doesn't have to get stuck again for Dr. Junius Roads office. I will add this to her labs tomorrow.  She may want a referral to [facility] for a second opinion but will see Dr. Jana Hakim 1st tomorrow and then decide.

## 2020-09-08 ENCOUNTER — Inpatient Hospital Stay (HOSPITAL_BASED_OUTPATIENT_CLINIC_OR_DEPARTMENT_OTHER): Payer: 59 | Admitting: Oncology

## 2020-09-08 ENCOUNTER — Inpatient Hospital Stay: Payer: 59

## 2020-09-08 ENCOUNTER — Telehealth: Payer: Self-pay | Admitting: Adult Health

## 2020-09-08 ENCOUNTER — Other Ambulatory Visit: Payer: 59

## 2020-09-08 ENCOUNTER — Other Ambulatory Visit: Payer: Self-pay | Admitting: Oncology

## 2020-09-08 ENCOUNTER — Encounter: Payer: Self-pay | Admitting: *Deleted

## 2020-09-08 ENCOUNTER — Other Ambulatory Visit: Payer: Self-pay | Admitting: *Deleted

## 2020-09-08 ENCOUNTER — Other Ambulatory Visit: Payer: Self-pay

## 2020-09-08 VITALS — BP 113/73 | HR 84 | Temp 98.2°F | Resp 17 | Ht 64.0 in | Wt 108.1 lb

## 2020-09-08 DIAGNOSIS — Z86718 Personal history of other venous thrombosis and embolism: Secondary | ICD-10-CM

## 2020-09-08 DIAGNOSIS — C50511 Malignant neoplasm of lower-outer quadrant of right female breast: Secondary | ICD-10-CM

## 2020-09-08 DIAGNOSIS — Z170 Estrogen receptor positive status [ER+]: Secondary | ICD-10-CM

## 2020-09-08 DIAGNOSIS — E860 Dehydration: Secondary | ICD-10-CM

## 2020-09-08 DIAGNOSIS — Z5112 Encounter for antineoplastic immunotherapy: Secondary | ICD-10-CM | POA: Diagnosis not present

## 2020-09-08 LAB — CBC WITH DIFFERENTIAL (CANCER CENTER ONLY)
Abs Immature Granulocytes: 6.6 10*3/uL — ABNORMAL HIGH (ref 0.00–0.07)
Band Neutrophils: 10 %
Basophils Absolute: 0 10*3/uL (ref 0.0–0.1)
Basophils Relative: 0 %
Eosinophils Absolute: 0 10*3/uL (ref 0.0–0.5)
Eosinophils Relative: 0 %
HCT: 37.9 % (ref 36.0–46.0)
Hemoglobin: 12.6 g/dL (ref 12.0–15.0)
Lymphocytes Relative: 6 %
Lymphs Abs: 2.2 10*3/uL (ref 0.7–4.0)
MCH: 30.3 pg (ref 26.0–34.0)
MCHC: 33.2 g/dL (ref 30.0–36.0)
MCV: 91.1 fL (ref 80.0–100.0)
Metamyelocytes Relative: 12 %
Monocytes Absolute: 3 10*3/uL — ABNORMAL HIGH (ref 0.1–1.0)
Monocytes Relative: 8 %
Myelocytes: 6 %
Neutro Abs: 25.1 10*3/uL — ABNORMAL HIGH (ref 1.7–7.7)
Neutrophils Relative %: 58 %
Platelet Count: 134 10*3/uL — ABNORMAL LOW (ref 150–400)
RBC: 4.16 MIL/uL (ref 3.87–5.11)
RDW: 11.7 % (ref 11.5–15.5)
WBC Count: 36.9 10*3/uL — ABNORMAL HIGH (ref 4.0–10.5)
nRBC: 0.1 % (ref 0.0–0.2)

## 2020-09-08 LAB — CMP (CANCER CENTER ONLY)
ALT: 48 U/L — ABNORMAL HIGH (ref 0–44)
AST: 36 U/L (ref 15–41)
Albumin: 3.4 g/dL — ABNORMAL LOW (ref 3.5–5.0)
Alkaline Phosphatase: 115 U/L (ref 38–126)
Anion gap: 6 (ref 5–15)
BUN: 9 mg/dL (ref 6–20)
CO2: 30 mmol/L (ref 22–32)
Calcium: 9.1 mg/dL (ref 8.9–10.3)
Chloride: 100 mmol/L (ref 98–111)
Creatinine: 0.78 mg/dL (ref 0.44–1.00)
GFR, Estimated: >60 mL/min (ref >60)
Glucose, Bld: 104 mg/dL — ABNORMAL HIGH (ref 70–99)
Potassium: 3.8 mmol/L (ref 3.5–5.1)
Sodium: 136 mmol/L (ref 135–145)
Total Bilirubin: 0.3 mg/dL (ref 0.3–1.2)
Total Protein: 6.2 g/dL — ABNORMAL LOW (ref 6.5–8.1)

## 2020-09-08 LAB — PROTIME-INR
INR: 1.1 (ref 0.8–1.2)
Prothrombin Time: 13.6 seconds (ref 11.4–15.2)

## 2020-09-08 MED ORDER — SODIUM CHLORIDE 0.9 % IV SOLN
Freq: Once | INTRAVENOUS | Status: DC
  Filled 2020-09-08: qty 250

## 2020-09-08 MED ORDER — TRAMADOL HCL 50 MG PO TABS
50.0000 mg | ORAL_TABLET | Freq: Four times a day (QID) | ORAL | 0 refills | Status: DC | PRN
Start: 2020-09-08 — End: 2021-08-02

## 2020-09-08 MED ORDER — SODIUM CHLORIDE 0.9 % IV SOLN
INTRAVENOUS | Status: DC
  Filled 2020-09-08 (×2): qty 250

## 2020-09-08 NOTE — Patient Instructions (Signed)

## 2020-09-08 NOTE — Telephone Encounter (Signed)
-----   Message from Elliot Gault sent at 09/07/2020  4:20 PM EDT ----- Regarding: P2P Good afternoon - [employer] is requesting a peer to peer for CT head. Please call them at [phone]. Case# [id number].  Thank you  Velna Hatchet

## 2020-09-08 NOTE — Telephone Encounter (Signed)
I called the number for peer to peer for CT head and it sounds as if it is a fax machine.  Message sent to our authorization team to double check the number.  Wilber Bihari, NP

## 2020-09-08 NOTE — Progress Notes (Signed)
I discussed many situation with Dr. Waldemar Dickens.  She thinks it is a good idea to stop the Plaquenil so long as the patient is taking cyclophosphamide.  I called the patient and left her voicemail

## 2020-09-08 NOTE — Progress Notes (Signed)
[place]  [phone] [phone]     ID: Mario Voong Maskell DOB: 1966/01/12  MR#: [id number]  XQJ#:[id number]  Patient Care Team: Eunice Blase, MD as PCP - General (Family Medicine) Prudence Davidson, Kenwood Bing, MD as Referring Physician (Oncology) Rockwell Germany, RN as Oncology Nurse Navigator Mauro Kaufmann, RN as Oncology Nurse Navigator Rolm Bookbinder, MD as Consulting Physician (General Surgery) Mccade Sullenberger, Virgie Dad, MD as Consulting Physician (Oncology) Kyung Rudd, MD as Consulting Physician (Radiation Oncology) Bo Merino, MD as Consulting Physician (Rheumatology) Olga Millers, MD as Consulting Physician (Obstetrics and Gynecology) Chauncey Cruel, MD OTHER MD:  CHIEF COMPLAINT: triple positive breast cancer  CURRENT TREATMENT: Neoadjuvant therapy    INTERVAL HISTORY: Shaneece returns today for follow up of her triple positive breast cancer.  She is accompanied by her longtime significant other fill.  She began neoadjuvant chemotherapy, consisting of docetaxel, carboplatin, trastuzumab, and pertuzumab every 21 days x6, on 08/31/2020.  She did well for the first 4 or 5 days but then things really got rough.  She developed a severe sore throat.  She had bony pains.  She is having scalp pain meaning that she is likely to lose her hair very soon.  She has felt very weak.  All of this was complicated by some confusion or miscommunication regarding her scheduled tests.  This is discussed further below.  She underwent abdomen ultrasound on 09/04/2020 showing: 2.6 cm solid lesion seen in caudate lobe of liver consistent with metastatic disease as described on prior MRI.  This was supposed to have been biopsied but it is not clear why it was not.    She underwent additional biopsy of the right breast on 09/05/2020. Pathology from the procedure ([id number]) showed: sclerotic lesion with focal lobular neoplasia.  She is scheduled for port  placement on 09/13/2020, bone scan, chest CT, and head CT on 09/14/2020, and liver biopsy on 09/20/2020.   REVIEW OF SYSTEMS: Jensen has been severely constipated.  Apparently this is a longtime issue for her but it is surprising that she has had no diarrhea despite the treatment with Pertuzumab.  Swallowing anything has been like swallowing pieces of glass she says.  She has not had fever, cough or phlegm production.  She keeps a headache.  She is supposed to be scheduled for head CT next week and that order was placed and in fact she was told it had been scheduled but I still do not see that it is listed on her appointments.   COVID 19 VACCINATION STATUS:    HISTORY OF CURRENT ILLNESS: From the original intake note:  Rissie herself palpated an abnormality in the right breast several years ago.  This had not changed until sometime in June 2021.  She was evaluated by Dr. Ouida Sills and underwent right diagnostic mammography with tomography and right breast ultrasonography at [facility] on 07/26/2020 showing: breast density category D; 2.6 cm mass in right breast at 6 o'clock, at site of palpable concern; two focal groups of punctate calcifications posterior to palpable mass; no right axillary adenopathy.  Accordingly on 08/04/2020 she proceeded to biopsy of the right breast area in question. The pathology from this procedure ([id number]) showed: invasive ductal carcinoma, grade 3. Prognostic indicators significant for: estrogen receptor, 95% positive with moderate staining intensity and progesterone receptor, 10% positive with strong staining intensity. Proliferation marker Ki67 at 40%. HER2 equivocal by immunohistochemistry (2+), but positive by fluorescent in situ hybridization with a signals ratio 3.23  and number per cell 6.45.  Biopsy of the posterior calcifications was benign.  She also underwent breast MRI on 08/11/2020 showing: breast composition D; 2.6 cm biopsy-proven malignancy in right  breast at 6 o'clock, with probable skin involvement noted on previous ultrasound; additional enhancing masses within right breast-- 5 mm in lower-outer, 1.2 cm in upper-inner, 7 mm in upper-outer; no evidence of left breast malignancy or lymphadenopathy; heterogeneous enhancement of sternum, of uncertain significance.  The patient's subsequent history is as detailed below.   PAST MEDICAL HISTORY: Past Medical History:  Diagnosis Date  . Anti-cardiolipin antibody positive   . Anti-cardiolipin antibody syndrome (HCC)   . Anxiety   . Arthralgia   . Arthritis    bil knees, hands  . Atypical chest pain   . Autoimmune disease (HCC)   . Cancer (HCC) 07/2020   right breast IDC  . Chronic fatigue   . Depression   . DVT (deep venous thrombosis) (HCC)   . IBS (irritable bowel syndrome)    with constipation  . Lupus (HCC)   . Pulmonary embolus (HCC)   . Raynaud's syndrome   . Sicca (HCC)   . Thrombocytopenia (HCC)     PAST SURGICAL HISTORY: Past Surgical History:  Procedure Laterality Date  . ABLATION  12/2018   leg veins  . ABLATION Right 08/2019   venous leg   . APPENDECTOMY    . COLON SURGERY     Perforated colon repair after colostomy  . GANGLION CYST EXCISION    . HYSTEROSCOPY WITH D & C N/A 01/13/2014   Procedure: DILATATION AND CURETTAGE /HYSTEROSCOPY with resection ;  Surgeon: Levi Aland, MD;  Location: [facility];  Service: Gynecology;  Laterality: N/A;  . LAPAROTOMY      FAMILY HISTORY: Family History  Problem Relation Age of Onset  . Rheum arthritis Father   . Hemachromatosis Father   . Cancer Sister        bone   as of October 2021 her father isage 8 and her mother age 52.. The patient had one sister (and no brothers). The sister was diagnosed with Ewing's sarcoma at age 78, and died from the disease after many difficult treatments.    GYNECOLOGIC HISTORY:  No LMP recorded. (Menstrual status: Irregular Periods).  Menarche: 54 or 54 years old GX P 0 LMP  around 2019 Contraceptive: yes HRT no  Hysterectomy? no BSO? 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 She is widowed. She lives at home alone with her cat, Sharma Covert. She is not a Advice worker.    ADVANCED DIRECTIVES: not in place; at the 08/16/2020 visit the patient was given the appropriate documents to complete and notarized at her discretion.   HEALTH MAINTENANCE: Social History   Tobacco Use  . Smoking status: Former Smoker    Packs/day: 0.50    Years: 5.00    Pack years: 2.50    Types: Cigarettes    Start date: 08/06/2006    Quit date: 08/07/2011    Years since quitting: 9.0  . Smokeless tobacco: Never Used  Vaping Use  . Vaping Use: Never used  Substance Use Topics  . Alcohol use: Yes    Comment: occasionally  . Drug use: No     Colonoscopy: 2002 for perforated bowel  PAP: 03/2020  Bone density: done at Dr. Ferd Hibbs office   Allergies  Allergen Reactions  . Beef-Derived Products   . Latex Rash  .  Sulfa Antibiotics Rash    Current Outpatient Medications  Medication Sig Dispense Refill  . amphetamine-dextroamphetamine (ADDERALL XR) 20 MG 24 hr capsule Take 1 capsule (20 mg total) by mouth daily. 30 capsule 0  . Ascorbic Acid (VITAMIN C PO) Take 3,000 mg by mouth daily.     Marland Kitchen azithromycin (ZITHROMAX Z-PAK) 250 MG tablet 2 tablets day 1 and then 1 tablet days 2 through 5 6 each 0  . b complex vitamins capsule Take 1 capsule by mouth daily.     Marland Kitchen CAMILA 0.35 MG tablet Take 1 tablet by mouth daily.  11  . cholecalciferol (VITAMIN D) 1000 UNITS tablet Take 1,000 Units by mouth daily.    Marland Kitchen dexamethasone (DECADRON) 4 MG tablet Take 2 tablets (8 mg total) by mouth 2 (two) times daily. Start the day before Taxotere. Then take daily x 3 days after chemotherapy. 30 tablet 1  . enoxaparin (LOVENOX) 30 MG/0.3ML injection Inject 0.3 mLs (30 mg total) into the skin daily. (Patient not taking: Reported on 09/04/2020) 6 mL 1  .  fluconazole (DIFLUCAN) 100 MG tablet Take as directed 30 tablet 0  . hydroxychloroquine (PLAQUENIL) 200 MG tablet TAKE 1 TABLET BY MOUTH TWICE DAILY MONDAY THROUGH FRIDAY ONLY. 120 tablet 0  . lidocaine-prilocaine (EMLA) cream Apply to affected area once 30 g 3  . LORazepam (ATIVAN) 0.5 MG tablet Take 1 tablet (0.5 mg total) by mouth at bedtime as needed (Nausea or vomiting). 30 tablet 0  . magic mouthwash SOLN Take 5 mLs by mouth 4 (four) times daily as needed for mouth pain. 240 mL 0  . Multiple Vitamins-Minerals (ZINC PO) Take by mouth.    . prochlorperazine (COMPAZINE) 10 MG tablet Take 0.5-1 tablets (5-10 mg total) by mouth 4 (four) times daily -  before meals and at bedtime. Start the evening of chemotherapy and take for 2 days, then take as needed 30 tablet 1  . SPIRONOLACTONE PO Take 25 mg by mouth as needed.  (Patient not taking: Reported on 09/04/2020)    . tretinoin (RETIN-A) 0.1 % cream tretinoin 0.1 % topical cream    . UNABLE TO FIND Green Vibrance - 1 scoop daily    . warfarin (COUMADIN) 5 MG tablet Take 1 tablet (5 mg total) by mouth daily. 30 tablet 11   No current facility-administered medications for this visit.   Facility-Administered Medications Ordered in Other Visits  Medication Dose Route Frequency Provider Last Rate Last Admin  . 0.9 %  sodium chloride infusion   Intravenous Continuous Batool Majid, Virgie Dad, MD   Stopped at 09/08/20 1539    OBJECTIVE: White woman who appears younger than stated age  70:   09/08/20 1216  BP: 113/73  Pulse: 84  Resp: 17  Temp: 98.2 F (36.8 C)  SpO2: 98%     Body mass index is 18.56 kg/m.   Wt Readings from Last 3 Encounters:  09/08/20 108 lb 1.6 oz (49 kg)  09/04/20 110 lb 8 oz (50.1 kg)  08/25/20 107 lb 9.6 oz (48.8 kg)      ECOG FS:2 - Symptomatic, <50% confined to bed  Sclerae unicteric, EOMs intact I do not see any evidence of thrush in the oropharynx No cervical or supraclavicular adenopathy Lungs no rales or  rhonchi Heart regular rate and rhythm Abd soft, nontender, positive bowel sounds MSK no focal spinal tenderness, no upper extremity lymphedema Neuro: nonfocal, well oriented, appropriate affect Breasts: Deferred   LAB RESULTS:  CMP  Component Value Date/Time   NA 136 09/08/2020 1141   NA 137 07/28/2020 1108   K 3.8 09/08/2020 1141   CL 100 09/08/2020 1141   CO2 30 09/08/2020 1141   GLUCOSE 104 (H) 09/08/2020 1141   BUN 9 09/08/2020 1141   BUN 8 07/28/2020 1108   CREATININE 0.78 09/08/2020 1141   CALCIUM 9.1 09/08/2020 1141   PROT 6.2 (L) 09/08/2020 1141   PROT 7.3 07/28/2020 1108   ALBUMIN 3.4 (L) 09/08/2020 1141   ALBUMIN 4.9 07/28/2020 1108   AST 36 09/08/2020 1141   ALT 48 (H) 09/08/2020 1141   ALKPHOS 115 09/08/2020 1141   BILITOT 0.3 09/08/2020 1141   GFRNONAA >60 09/08/2020 1141   GFRAA 95 07/28/2020 1108    No results found for: TOTALPROTELP, ALBUMINELP, A1GS, A2GS, BETS, BETA2SER, GAMS, MSPIKE, SPEI  Lab Results  Component Value Date   WBC 36.9 (H) 09/08/2020   NEUTROABS 25.1 (H) 09/08/2020   HGB 12.6 09/08/2020   HCT 37.9 09/08/2020   MCV 91.1 09/08/2020   PLT 134 (L) 09/08/2020    No results found for: LABCA2  No components found for: XLKGMW102  Recent Labs  Lab 09/08/20 1141  INR 1.1    No results found for: LABCA2  No results found for: VOZ366  No results found for: YQI347  No results found for: QQV956  No results found for: CA2729  No components found for: HGQUANT  No results found for: CEA1 / No results found for: CEA1   No results found for: AFPTUMOR  No results found for: CHROMOGRNA  No results found for: KPAFRELGTCHN, LAMBDASER, KAPLAMBRATIO (kappa/lambda light chains)  No results found for: HGBA, HGBA2QUANT, HGBFQUANT, HGBSQUAN (Hemoglobinopathy evaluation)   No results found for: LDH  Lab Results  Component Value Date   IRON 88 10/29/2018   IRON 88 10/29/2018   TIBC 241 (L) 10/29/2018   TIBC 241 (L)  10/29/2018   IRONPCTSAT 37 10/29/2018   IRONPCTSAT 37 10/29/2018   (Iron and TIBC)  Lab Results  Component Value Date   FERRITIN 43 10/29/2018   FERRITIN 43 10/29/2018    Urinalysis    Component Value Date/Time   APPEARANCEUR Clear 08/08/2020 1359   GLUCOSEU Negative 08/08/2020 1359   BILIRUBINUR Negative 08/08/2020 1359   PROTEINUR Negative 08/08/2020 1359   NITRITE Negative 08/08/2020 1359   LEUKOCYTESUR Negative 08/08/2020 1359     STUDIES: MR ABDOMEN WWO CONTRAST  Result Date: 08/24/2020 CLINICAL DATA:  Indeterminate liver lesions on recent CT. Right breast carcinoma. EXAM: MRI ABDOMEN WITHOUT AND WITH CONTRAST TECHNIQUE: Multiplanar multisequence MR imaging of the abdomen was performed both before and after the administration of intravenous contrast. CONTRAST:  82mL MULTIHANCE GADOBENATE DIMEGLUMINE 529 MG/ML IV SOLN COMPARISON:  CT on 07/14/2020 FINDINGS: Lower chest: No acute findings. Hepatobiliary: Multiple hypovascular masses are now seen in the caudate, right, and left hepatic lobes. The largest in the caudate lobe measures 2.3 x 1.9 cm. The 2nd largest mass is seen in the anterior liver near the junction of the right and left lobes on image 17/17, which measures 1.2 x 1.2 cm. These findings are highly suspicious for liver metastases. A tiny less than 5 mm gallstone is noted, however there is no evidence of cholecystitis or biliary ductal dilatation. Pancreas:  No mass or inflammatory changes. Spleen:  Within normal limits in size and appearance. Adrenals/Urinary Tract: No masses identified. No evidence of hydronephrosis. Stomach/Bowel: Visualized portion unremarkable. Vascular/Lymphatic: No pathologically enlarged lymph nodes identified. No abdominal aortic  aneurysm. Other:  None. Musculoskeletal:  No suspicious bone lesions identified. IMPRESSION: Multiple hypovascular masses in the caudate, right, and left hepatic lobes, highly suspicious for liver metastases. No other sites  of abdominal metastatic disease identified. Tiny gallstone. No radiographic evidence of cholecystitis or biliary ductal dilatation. Electronically Signed   By: Marlaine Hind M.D.   On: 08/24/2020 16:08   MR BREAST BILATERAL W WO CONTRAST INC CAD  Result Date: 08/14/2020 CLINICAL DATA:  Known biopsy-proven RIGHT breast cancer (08/08/2020). LABS:  Not applicable EXAM: BILATERAL BREAST MRI WITH AND WITHOUT CONTRAST TECHNIQUE: Multiplanar, multisequence MR images of both breasts were obtained prior to and following the intravenous administration of 5 ml of Gadavist Three-dimensional MR images were rendered by post-processing of the original MR data on an independent workstation. The three-dimensional MR images were interpreted, and findings are reported in the following complete MRI report for this study. Three dimensional images were evaluated at the independent interpreting workstation using the DynaCAD thin client. COMPARISON:  Previous exams.  No previous breast MRI. FINDINGS: Breast composition: d. Extreme fibroglandular tissue. Background parenchymal enhancement: Moderate. Right breast: Irregular enhancing mass within the lower RIGHT breast, 6 o'clock axis, measuring 2.6 x 2.2 x 2.5 cm (craniocaudal by transverse by AP dimensions), with associated biopsy clip artifact, corresponding to patient's known biopsy-proven malignancy. The mass is superficial, underlying the skin of the lower RIGHT breast. Previous ultrasound showed probable skin involvement. Additional oval circumscribed enhancing mass within the lower inner quadrant of the RIGHT breast, at posterior depth, 4 o'clock axis region, measuring 5 mm (axial series 9, image 94). Additional irregular enhancing mass within the upper inner quadrant of the RIGHT breast, at middle depth, measuring 1.2 x 0.7 cm (best seen on sagittal reformatted series 100, images 194 through 197; axial series 12, images 56 through 58). Additional irregular enhancing mass within  the upper-outer quadrant of the RIGHT breast, at anterior depth, measuring 7 mm (axial series 12, image 61; sagittal reformatted series 100, image 224). Additional biopsy clip artifact within the posterior RIGHT breast corresponding to the site of benign stereotactic biopsy. Left breast: No suspicious enhancing mass, non-mass enhancement or secondary signs of malignancy. Lymph nodes: No enlarged or morphologically abnormal lymph nodes are identified within either axillary region or within the internal mammary chain regions. Ancillary findings:  Heterogeneous enhancement of the sternum. IMPRESSION: 1. Known biopsy-proven invasive ductal carcinoma of the RIGHT breast, 6 o'clock axis, measuring 2.6 cm, with associated biopsy clip. This mass is superficial, underlying the skin of the lower RIGHT breast. Previous ultrasound showed probable skin involvement. 2. Additional oval circumscribed enhancing mass within the lower inner quadrant of the RIGHT breast, at posterior depth, measuring 5 mm. 3. Additional irregular enhancing mass within the upper inner quadrant of the RIGHT breast, at middle depth, measuring 1.2 cm. 4. Additional irregular enhancing mass within the upper-outer quadrant of the RIGHT breast, at anterior depth, measuring 7 mm. 5. Additional biopsy clip artifact within the posterior RIGHT breast, corresponding to a benign stereotactic biopsy site. 6. No evidence of malignancy within the LEFT breast. 7. No evidence of malignant lymphadenopathy. 8. Heterogeneous enhancement of the sternum, of uncertain significance. 9. CT abdomen report of 07/14/2020 describes 2 indeterminate lesions in the liver, largest measuring 2.2 cm, with abdominal MRI recommended for further characterization. RECOMMENDATION: 1. If breast conservation therapy is being considered for the RIGHT breast, recommend additional MRI guided biopsies for the 3 additional masses in the RIGHT breast as described above. Two masses are located  within  the inner RIGHT breast and 1 mass is located within the upper-outer quadrant of the RIGHT breast. Therefore, if patient positioning for the biopsies does not allow for all 3 masses to be biopsied on the same day, the 2 masses within the inner RIGHT breast could be biopsied on 1 day and the additional mass within the upper-outer RIGHT breast could be biopsied on a separate day. 2. MRI of the abdomen for further characterization of 2 indeterminate liver lesions described on CT abdomen report of 07/14/2020. These liver lesions are concerning given the known RIGHT breast cancer. 3. Would also consider PET-CT and/or bone scan given the heterogeneous enhancement of the sternum. BI-RADS CATEGORY  4: Suspicious. Electronically Signed   By: Franki Cabot M.D.   On: 08/14/2020 11:16   DG Abd 2 Views  Result Date: 09/04/2020 CLINICAL DATA:  Constipation, diffuse abdominal pain, breast cancer. EXAM: ABDOMEN - 2 VIEW COMPARISON:  None. FINDINGS: Fair amount of stool is seen in the colon. No small bowel dilatation. No unexpected radiopaque calculi. IMPRESSION: Fair amount of stool in the colon is indicative of constipation. Electronically Signed   By: Lorin Picket M.D.   On: 09/04/2020 10:53   MM CLIP PLACEMENT RIGHT  Result Date: 09/05/2020 CLINICAL DATA:  Status post MRI guided biopsy today of a suspicious mass within the upper-outer quadrant the RIGHT breast. Patient had an earlier ultrasound-guided biopsy confirming malignancy within the RIGHT breast at the 6 o'clock axis. An additional earlier stereotactic biopsy of the posterior RIGHT breast revealed a benign pathology result. EXAM: DIAGNOSTIC RIGHT MAMMOGRAM POST MRI BIOPSY COMPARISON:  Previous exam(s). FINDINGS: Mammographic images were obtained following MRI guided biopsy of the suspicious mass within the upper-outer quadrant of the RIGHT breast. The biopsy marking clip is approximately 5 mm inferomedial to the biopsy site. IMPRESSION: 1. Today's BARBELL  shaped clip is adequately positioned approximately 5 mm inferomedial to the biopsy cavity. 2. A ribbon shaped clip corresponds to a biopsy-proven cancer within the RIGHT breast at the 6 o'clock axis. 3. A coil shaped clip within the posterior RIGHT breast corresponds to a previous benign stereotactic biopsy. Final Assessment: Post Procedure Mammograms for Marker Placement Electronically Signed   By: Franki Cabot M.D.   On: 09/05/2020 09:12   MR RT BREAST BX W LOC DEV 1ST LESION IMAGE BX SPEC MR GUIDE  Addendum Date: 09/07/2020   ADDENDUM REPORT: 09/07/2020 14:20 ADDENDUM: Pathology revealed SCLEROTIC LESION WITH FOCAL LOBULAR NEOPLASIA (ATYPICAL LOBULAR HYPERPLASIA) od the Right breast, upper outer quadrant. The differential diagnosis includes a sclerotic fibroadenoma and sclerotic papilloma. This was found to be concordant by Dr. Franki Cabot, with excision recommended. Pathology results were discussed with the patient by telephone. The patient reported doing well after the biopsy with tenderness at the site. Post biopsy instructions and care were reviewed and questions were answered. The patient was encouraged to call [facility] for any additional concerns. My direct phone number was provided. The patient has a recent diagnosis of Right breast cancer and should follow her outlined treatment plan. Dr. Rolm Bookbinder was notified of biopsy results via EPIC message on September 07, 2020. There are 2 additional suspicious masses within the inner Right breast. If breast conservation is still considered after this biopsy, the patient will need to be scheduled for the 2 additional masses in the inner Right breast. Per my conversation with Dr. Donne Hazel today, additional MRI-guided biopsies will not be requested/ordered at this time. Pathology results  reported by Terie Purser, RN on 09/07/2020. Electronically Signed   By: Franki Cabot M.D.   On: 09/07/2020 14:20   Result Date:  09/07/2020 CLINICAL DATA:  Patient with a known biopsy-proven RIGHT breast cancer at the 6 o'clock axis. Subsequent breast MRI revealing 3 additional suspicious masses within the RIGHT breast, 2 of which are located within the inner RIGHT breast and the third mass within the upper-outer quadrant. This mass within the upper-outer quadrant is farthest from the known cancer so will be biopsied today. EXAM: MRI GUIDED CORE NEEDLE BIOPSY OF THE RIGHT BREAST TECHNIQUE: Multiplanar, multisequence MR imaging of the RIGHT breast was performed both before and after administration of intravenous contrast. CONTRAST:  81mL GADAVIST GADOBUTROL 1 MMOL/ML IV SOLN COMPARISON:  Previous exams. FINDINGS: I met with the patient, and we discussed the procedure of MRI guided biopsy, including risks, benefits, and alternatives. Specifically, we discussed the risks of infection, bleeding, tissue injury, clip migration, and inadequate sampling. Informed, written consent was given. The usual time out protocol was performed immediately prior to the procedure. Using sterile technique, 1% Lidocaine, MRI guidance, and a 9 gauge vacuum assisted device, biopsy was performed of the suspicious mass within the upper-outer quadrant of the RIGHT breast using a lateral approach. At the conclusion of the procedure, a barbell shaped tissue marker clip was deployed into the biopsy cavity. Follow-up 2-view mammogram was performed and dictated separately. IMPRESSION: MRI guided biopsy of the suspicious mass within the upper-outer quadrant of the RIGHT breast. No apparent complications. Electronically Signed: By: Franki Cabot M.D. On: 09/05/2020 09:05   US Abdomen Limited RUQ (LIVER/GB)  Result Date: 09/04/2020 CLINICAL DATA:  History of breast cancer. EXAM: ULTRASOUND ABDOMEN LIMITED RIGHT UPPER QUADRANT COMPARISON:  August 24, 2020.  July 14, 2020. FINDINGS: Gallbladder: Probable 6 mm calculus is noted. No gallbladder wall thickening or  pericholecystic fluid is noted. No sonographic Murphy's sign is noted. Common bile duct: Diameter: 5 mm which is within normal limits. Liver: 2.6 cm solid lesion is seen in the region of the caudate lobe which corresponds to metastatic lesion seen on prior MRI. The other metastatic lesions noted on prior MRI are not well visualized on this study. Within normal limits in parenchymal echogenicity. Portal vein is patent on color Doppler imaging with normal direction of blood flow towards the liver. Other: None. IMPRESSION: 2.6 cm solid lesion seen in the caudate lobe of the liver consistent with metastatic disease as described on prior MRI. Cholelithiasis is noted without evidence of cholecystitis. Electronically Signed   By: Marijo Conception M.D.   On: 09/04/2020 09:20     ELIGIBLE FOR AVAILABLE RESEARCH PROTOCOL: AET  ASSESSMENT: 54 y.o. [place] woman status post right breast lower outer quadrant biopsy 08/04/2020 for a clinical T2 N0 M1, stage IV invasive ductal carcinoma, grade 3, triple positive, with an MIB-1 of 40%  (a) breast MRI 08/14/2020 shows 3 additional right breast lesions and heterogeneous enhancement of the sternum  (b) biopsy of the additional right breast masses  (c) CT scan of the abdomen and pelvis 07/14/2020 shows 2 indeterminate liver lesions  (d) abdominal MRI 08/24/2020 shows multiple liver lesions highly suspicious for liver metastases.  (e) liver biopsy  (f) bone scan  (g) CT scan of the chest  (h) head CT  (1) neoadjuvant chemotherapy to consist of docetaxel, carboplatin, trastuzumab and pertuzumab every 21 days x 6 started 08/31/2020  (2) trastuzumab to continue to complete a year (through October 2022).  (a)  echo 04/12/2020 shows an ejection fraction in the 60-65% range  (3) definitive surgery to follow  (4) adjuvant radiation as appropriate  (5) antiestrogens to start at the completion of local treatment   PLAN: Shyler had a rough time with her first cycle of  chemo/immunotherapy.  The biggest problem was pain from the immune booster and severe mucositis.  She tells me that she saw white in her throat but I did not see that today.  She is on Diflucan and will take that for a total of 5 days.  We certainly can repeat that with cycle 2 and certainly steroids can cause a yeast infection.  I wonder though whether her being on Plaquenil at the same time it may have exacerbated the immune problems.  I have sent a note to Dr Estanislado Pandy asking if it might be possible to admit the paclitaxel while Latonda is undergoing chemo treatment.  Abbe Amsterdam was very upset and some of the interactions they have had regarding scheduling.  Some of this he is going to take up with Dr. Donne Hazel her surgeon and I have also alerted Dara Lords in radiology.  I hope they can make this good.  I have discussed this further with our navigators and feels says that the navigators are very helpful.  Certainly if we can do anything else to facilitate and clarify these issues we will do it.  Now that she has had the first cycle and we are entering really the rhythm of every 3-week treatments I am hopeful things will straighten out.  She does have quite a few tests coming up this coming week and I still do not see the head CT on the scheduled list even though everything else is on that list at present.  We discussed why she needs a liver biopsy.  Namely if she does have stage IV disease we need to continue Herceptin indefinitely whereas if she does not we will stop after 1 year.  As far as the diarrhea is concerned she is going to start MiraLAX daily and stool softeners 2 tablets twice daily with magnesium citrate one half bottle every third day as needed, and may repeat.  For the pain she can use Tylenol up to 1 g 3 times a day.  After that she would need to go to tramadol.  Of course the tramadol can make the constipation worse.  I will see her again with cycle #2 and at that point we will review  some of the suggestions made above (start Diflucan and bowel prophylaxis on day 1, consider dropping the dexamethasone to 4 mg instead of 8).  Total encounter time 25 minutes.Sarajane Jews C. Maizy Davanzo, MD 09/08/2020 7:03 PM Medical Oncology and Hematology [facility] [place], [place] [address] Tel. [phone]    Fax. [phone]   This document serves as a record of services personally performed by Lurline Del, MD. It was created on his behalf by Wilburn Mylar, a trained medical scribe. The creation of this record is based on the scribe's personal observations and the provider's statements to them.   I, Lurline Del MD, have reviewed the above documentation for accuracy and completeness, and I agree with the above.   *Total Encounter Time as defined by the Centers for Medicare and Medicaid Services includes, in addition to the face-to-face time of a patient visit (documented in the note above) non-face-to-face time: obtaining and reviewing outside history, ordering and reviewing medications, tests  or procedures, care coordination (communications with other health care professionals or caregivers) and documentation in the medical record.

## 2020-09-08 NOTE — Progress Notes (Signed)
IVF ordered

## 2020-09-09 ENCOUNTER — Other Ambulatory Visit (HOSPITAL_COMMUNITY): Payer: 59

## 2020-09-11 ENCOUNTER — Telehealth: Payer: Self-pay | Admitting: *Deleted

## 2020-09-11 ENCOUNTER — Encounter: Payer: Self-pay | Admitting: Family Medicine

## 2020-09-11 ENCOUNTER — Telehealth: Payer: Self-pay

## 2020-09-11 ENCOUNTER — Other Ambulatory Visit (HOSPITAL_COMMUNITY)
Admission: RE | Admit: 2020-09-11 | Discharge: 2020-09-11 | Disposition: A | Discharge status: Home / Self Care | Payer: 59 | Source: Ambulatory Visit | Attending: General Surgery | Admitting: General Surgery

## 2020-09-11 ENCOUNTER — Encounter: Payer: Self-pay | Admitting: *Deleted

## 2020-09-11 DIAGNOSIS — Z20822 Contact with and (suspected) exposure to covid-19: Secondary | ICD-10-CM | POA: Insufficient documentation

## 2020-09-11 DIAGNOSIS — Z01812 Encounter for preprocedural laboratory examination: Secondary | ICD-10-CM | POA: Diagnosis present

## 2020-09-11 DIAGNOSIS — C50919 Malignant neoplasm of unspecified site of unspecified female breast: Secondary | ICD-10-CM

## 2020-09-11 HISTORY — DX: Malignant neoplasm of unspecified site of unspecified female breast: C50.919

## 2020-09-11 LAB — SARS CORONAVIRUS 2 (TAT 6-24 HRS): SARS Coronavirus 2: NEGATIVE

## 2020-09-11 MED ORDER — RIVAROXABAN 15 MG PO TABS: 15.0000 mg | ORAL_TABLET | Freq: Every day | ORAL | 3 refills | Status: DC

## 2020-09-11 MED ORDER — AMPHETAMINE-DEXTROAMPHET ER 20 MG PO CP24
20.0000 mg | ORAL_CAPSULE | Freq: Every day | ORAL | 0 refills | Status: DC
Start: 2020-09-11 — End: 2020-10-10

## 2020-09-11 NOTE — Addendum Note (Signed)
Addended by: Hortencia Pilar on: 09/11/2020 04:51 PM   Modules accepted: Orders

## 2020-09-11 NOTE — Telephone Encounter (Signed)
Spoke with patient to follow up from her appointment with Dr. Jana Hakim on Friday.  She states that this went really well.  She is a little nervous about having her port placed but overall is feeling better.  I confirmed that she is scheduled for the ct chest as well as the head CT on 11/4. She has requested social work to contact her for some financial resources/foundations to see if she would qualify and I will send a message for them to contact her.  Answered her questions and encouraged her to call if anything else should arise.  Patient appreciative and verbalized understanding.

## 2020-09-11 NOTE — Telephone Encounter (Signed)
Patient called requesting prescription refill of Adderall to be sent to [facility] at [address].   

## 2020-09-11 NOTE — Telephone Encounter (Signed)
Last Visit: 07/27/2020 Next Visit: 12/28/2020  Last fill: 08/07/2020  Okay to refill adderall?

## 2020-09-12 ENCOUNTER — Telehealth: Payer: Self-pay

## 2020-09-12 ENCOUNTER — Telehealth: Payer: Self-pay | Admitting: Licensed Clinical Social Worker

## 2020-09-12 ENCOUNTER — Encounter: Payer: Self-pay | Admitting: Licensed Clinical Social Worker

## 2020-09-12 ENCOUNTER — Encounter (HOSPITAL_BASED_OUTPATIENT_CLINIC_OR_DEPARTMENT_OTHER)
Admission: RE | Admit: 2020-09-12 | Discharge: 2020-09-12 | Disposition: A | Discharge status: Home / Self Care | Payer: 59 | Source: Ambulatory Visit | Attending: General Surgery | Admitting: General Surgery

## 2020-09-12 DIAGNOSIS — Z01812 Encounter for preprocedural laboratory examination: Secondary | ICD-10-CM | POA: Diagnosis not present

## 2020-09-12 DIAGNOSIS — Z882 Allergy status to sulfonamides status: Secondary | ICD-10-CM | POA: Diagnosis not present

## 2020-09-12 DIAGNOSIS — C50111 Malignant neoplasm of central portion of right female breast: Secondary | ICD-10-CM | POA: Diagnosis present

## 2020-09-12 DIAGNOSIS — Z809 Family history of malignant neoplasm, unspecified: Secondary | ICD-10-CM | POA: Diagnosis not present

## 2020-09-12 LAB — PROTIME-INR
INR: 1 (ref 0.8–1.2)
Prothrombin Time: 13 seconds (ref 11.4–15.2)

## 2020-09-12 MED ORDER — ENSURE PRE-SURGERY PO LIQD: 296.0000 mL | Freq: Once | ORAL | Status: DC

## 2020-09-12 NOTE — Progress Notes (Signed)
[place] Work  Initial Assessment   Erica Wood is a 54 y.o. year old female contacted by phone. Clinical Social Work was referred by Engineer, site for assessment of psychosocial needs.   SDOH (Social Determinants of Health) assessments performed: No   Distress Screen completed: No   Family/Social Information:  . Housing Arrangement: patient lives alone. She is widowed. Has significant other (Phil) . Family members/support persons in your life? Family and Friends/Colleagues . Transportation concerns: no  . Employment: Out of work due to pain Employed by [employer]. Income source: Short-Term Disability . Financial concerns: Yes, due to illness and/or loss of work during treatment o Type of concern: Medical bills . Food access concerns: no . Religious or spiritual practice: not addressed . Services Currently in place:  Short-term disability through work  Coping/ Adjustment to diagnosis: . Patient understands treatment plan and what happens next? yes, has upcoming port placement and additional scans and biopsy . Concerns about diagnosis and/or treatment: Overwhelmed by information and How I will pay for the services I need . 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 Financial constraints related to being out of work due to treatment  Clinical Social Work Clinical Goal(s):  Marland Kitchen Over the next 60 days, patient will work with SW to address concerns related to financial strain by completing applications for cancer foundations  Interventions: . Discussed common feeling and emotions when being diagnosed with cancer, and the importance of support during treatment . Informed patient of the support team roles and support services at [facility] . Provided CSW contact information and encouraged patient to call with any questions or concerns . Provided patient with information about cancer foundations   . Patient referred to [facility] to help with applying for social security disability   Follow Up Plan: CSW will follow-up with patient in infusion on 09/21/20.  Patient verbalizes understanding of plan: Yes    Edwinna Areola Chiron Campione , LCSW

## 2020-09-12 NOTE — Progress Notes (Signed)

## 2020-09-12 NOTE — Telephone Encounter (Signed)
Pt called and LVM asking if she would need to f/u with [facility] everytime she has chemo to arrange receiving Neulasta. This LPN attempted to return pt's call; no answer. LVM for pt to return call.

## 2020-09-12 NOTE — Telephone Encounter (Signed)
[place] Work  Clinical Social Work was referred by Engineer, site for assessment of psychosocial needs and offer financial resources.  Clinical Social Worker attempted to contact patient by phone to assess for needs and offer support.  No answer. Left VM with direct contact information.     Lilyonna Steidle, [place], [place] Worker [facility]

## 2020-09-13 ENCOUNTER — Ambulatory Visit (HOSPITAL_COMMUNITY): Payer: 59

## 2020-09-13 ENCOUNTER — Ambulatory Visit (HOSPITAL_BASED_OUTPATIENT_CLINIC_OR_DEPARTMENT_OTHER)
Admission: RE | Admit: 2020-09-13 | Discharge: 2020-09-13 | Disposition: A | Discharge status: Home / Self Care | Payer: 59 | Attending: General Surgery | Admitting: General Surgery

## 2020-09-13 ENCOUNTER — Encounter (HOSPITAL_BASED_OUTPATIENT_CLINIC_OR_DEPARTMENT_OTHER): Payer: Self-pay | Admitting: General Surgery

## 2020-09-13 ENCOUNTER — Other Ambulatory Visit: Payer: Self-pay

## 2020-09-13 ENCOUNTER — Ambulatory Visit (HOSPITAL_BASED_OUTPATIENT_CLINIC_OR_DEPARTMENT_OTHER): Payer: 59 | Admitting: Anesthesiology

## 2020-09-13 ENCOUNTER — Telehealth: Payer: Self-pay | Admitting: Adult Health

## 2020-09-13 ENCOUNTER — Other Ambulatory Visit: Payer: Self-pay | Admitting: Oncology

## 2020-09-13 ENCOUNTER — Other Ambulatory Visit (HOSPITAL_COMMUNITY): Payer: 59

## 2020-09-13 ENCOUNTER — Encounter (HOSPITAL_BASED_OUTPATIENT_CLINIC_OR_DEPARTMENT_OTHER)
Admission: RE | Disposition: A | Discharge status: Home / Self Care | Payer: Self-pay | Source: Home / Self Care | Attending: General Surgery

## 2020-09-13 DIAGNOSIS — Z419 Encounter for procedure for purposes other than remedying health state, unspecified: Secondary | ICD-10-CM

## 2020-09-13 DIAGNOSIS — Z809 Family history of malignant neoplasm, unspecified: Secondary | ICD-10-CM | POA: Insufficient documentation

## 2020-09-13 DIAGNOSIS — Z882 Allergy status to sulfonamides status: Secondary | ICD-10-CM | POA: Insufficient documentation

## 2020-09-13 DIAGNOSIS — C50111 Malignant neoplasm of central portion of right female breast: Secondary | ICD-10-CM | POA: Insufficient documentation

## 2020-09-13 HISTORY — DX: Depression, unspecified: F32.A

## 2020-09-13 HISTORY — DX: Irritable bowel syndrome, unspecified: K58.9

## 2020-09-13 HISTORY — DX: Unspecified osteoarthritis, unspecified site: M19.90

## 2020-09-13 HISTORY — PX: PORTACATH PLACEMENT: SHX2246

## 2020-09-13 LAB — POCT PREGNANCY, URINE: Preg Test, Ur: NEGATIVE

## 2020-09-13 IMAGING — RF DG FLUORO GUIDE CV LINE
1 series · 1 of 1 positions shown · non-contrast
Comparison: None.

CLINICAL DATA: 54-year-old female undergoing placement of a
surgical port catheter.

EXAM:
CHEST FLUOROSCOPY
TECHNIQUE: Real-time fluoroscopic evaluation of the chest was performed.
FLUOROSCOPY TIME:  Fluoroscopy Time:  0 minutes 20 seconds
Radiation Exposure Index (if provided by the fluoroscopic device):
0.88 mGy

[Series 1: run · 1 of 1 slices shown]
[im 1/1]
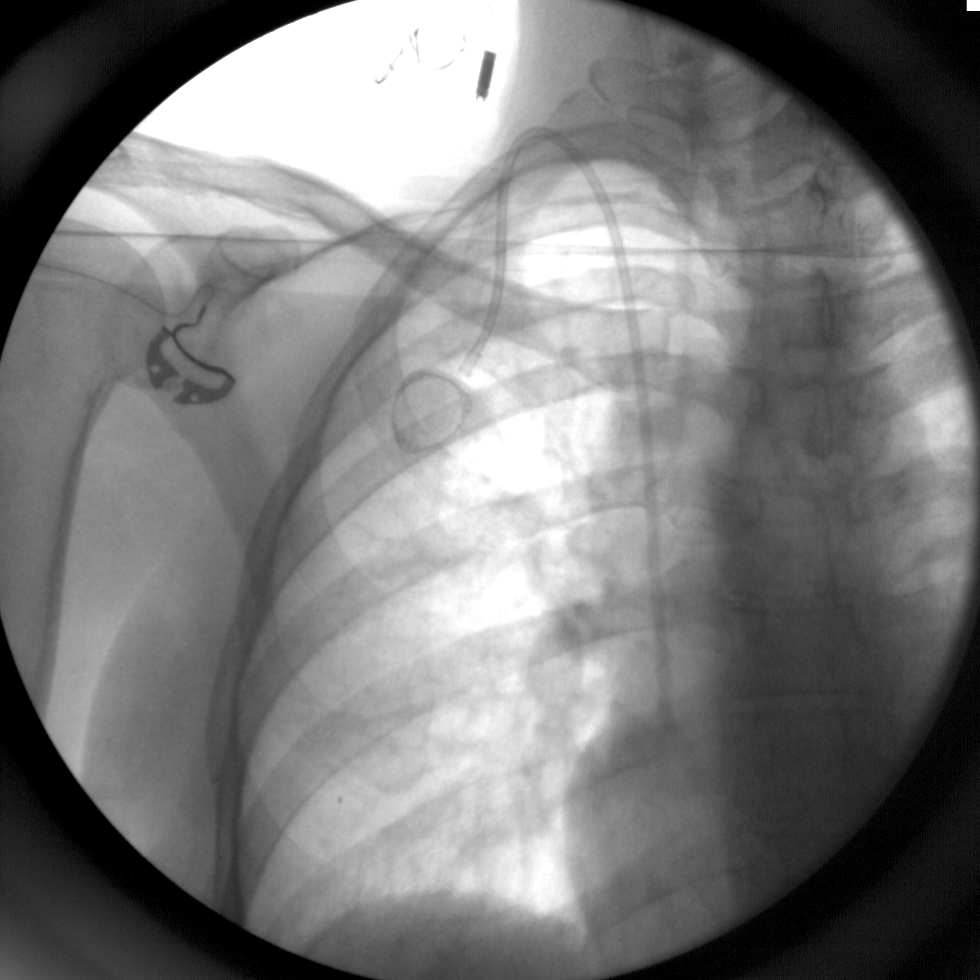

[1 of 1 positions shown; findings below may reference images not displayed]

FINDINGS: A single saved fluoro images submitted for review. The image
demonstrates a right IJ approach single-lumen power injectable port
catheter. The catheter tip overlies the superior cavoatrial
junction. No evidence of pneumothorax.
IMPRESSION: Right IJ approach single-lumen power injectable port catheter.

The catheter tip overlies the superior cavoatrial junction.

## 2020-09-13 SURGERY — INSERTION, TUNNELED CENTRAL VENOUS DEVICE, WITH PORT
Anesthesia: General

## 2020-09-13 MED ORDER — PROPOFOL 10 MG/ML IV BOLUS
INTRAVENOUS | Status: AC
  Filled 2020-09-13: qty 20

## 2020-09-13 MED ORDER — OXYCODONE HCL 5 MG PO TABS
5.0000 mg | ORAL_TABLET | Freq: Four times a day (QID) | ORAL | 0 refills | Status: DC | PRN
Start: 2020-09-13 — End: 2021-02-14

## 2020-09-13 MED ORDER — CEFAZOLIN SODIUM-DEXTROSE 2-4 GM/100ML-% IV SOLN
2.0000 g | INTRAVENOUS | Status: AC
  Administered 2020-09-13: 2 g via INTRAVENOUS

## 2020-09-13 MED ORDER — LIDOCAINE HCL (CARDIAC) PF 100 MG/5ML IV SOSY
PREFILLED_SYRINGE | INTRAVENOUS | Status: DC | PRN
  Administered 2020-09-13: 40 mg via INTRAVENOUS

## 2020-09-13 MED ORDER — ACETAMINOPHEN 500 MG PO TABS: 1000.0000 mg | ORAL_TABLET | Freq: Once | ORAL | Status: AC

## 2020-09-13 MED ORDER — HEPARIN (PORCINE) IN NACL 1000-0.9 UT/500ML-% IV SOLN
INTRAVENOUS | Status: AC
  Filled 2020-09-13: qty 500

## 2020-09-13 MED ORDER — DEXAMETHASONE SODIUM PHOSPHATE 10 MG/ML IJ SOLN
INTRAMUSCULAR | Status: AC
  Filled 2020-09-13: qty 1

## 2020-09-13 MED ORDER — MIDAZOLAM HCL 5 MG/5ML IJ SOLN
INTRAMUSCULAR | Status: DC | PRN
  Administered 2020-09-13: 2 mg via INTRAVENOUS

## 2020-09-13 MED ORDER — FENTANYL CITRATE (PF) 100 MCG/2ML IJ SOLN
INTRAMUSCULAR | Status: AC
  Filled 2020-09-13: qty 2

## 2020-09-13 MED ORDER — ACETAMINOPHEN 500 MG PO TABS: 1000.0000 mg | ORAL_TABLET | ORAL | Status: DC

## 2020-09-13 MED ORDER — CEFAZOLIN SODIUM-DEXTROSE 2-4 GM/100ML-% IV SOLN
INTRAVENOUS | Status: AC
  Filled 2020-09-13: qty 100

## 2020-09-13 MED ORDER — METOCLOPRAMIDE HCL 10 MG PO TABS: 10.0000 mg | ORAL_TABLET | Freq: Four times a day (QID) | ORAL | 2 refills | Status: DC | PRN

## 2020-09-13 MED ORDER — ACETAMINOPHEN 10 MG/ML IV SOLN
INTRAVENOUS | Status: DC | PRN
  Administered 2020-09-13: 750 mg via INTRAVENOUS

## 2020-09-13 MED ORDER — PROPOFOL 10 MG/ML IV BOLUS
INTRAVENOUS | Status: DC | PRN
  Administered 2020-09-13: 50 mg via INTRAVENOUS
  Administered 2020-09-13: 150 mg via INTRAVENOUS

## 2020-09-13 MED ORDER — HEPARIN (PORCINE) IN NACL 2-0.9 UNITS/ML
INTRAMUSCULAR | Status: AC | PRN
  Administered 2020-09-13: 1 via INTRAVENOUS

## 2020-09-13 MED ORDER — OXYCODONE HCL 5 MG PO TABS
ORAL_TABLET | ORAL | Status: AC
  Filled 2020-09-13: qty 1

## 2020-09-13 MED ORDER — FENTANYL CITRATE (PF) 100 MCG/2ML IJ SOLN: 25.0000 ug | INTRAMUSCULAR | Status: DC | PRN

## 2020-09-13 MED ORDER — OXYCODONE HCL 5 MG PO TABS
5.0000 mg | ORAL_TABLET | Freq: Once | ORAL | Status: AC
  Administered 2020-09-13: 5 mg via ORAL

## 2020-09-13 MED ORDER — HEPARIN SOD (PORK) LOCK FLUSH 100 UNIT/ML IV SOLN
INTRAVENOUS | Status: DC | PRN
  Administered 2020-09-13: 450 [IU] via INTRAVENOUS

## 2020-09-13 MED ORDER — DEXAMETHASONE SODIUM PHOSPHATE 4 MG/ML IJ SOLN
INTRAMUSCULAR | Status: DC | PRN
  Administered 2020-09-13: 5 mg via INTRAVENOUS

## 2020-09-13 MED ORDER — ONDANSETRON HCL 4 MG/2ML IJ SOLN
INTRAMUSCULAR | Status: DC | PRN
  Administered 2020-09-13: 4 mg via INTRAVENOUS

## 2020-09-13 MED ORDER — LACTATED RINGERS IV SOLN: INTRAVENOUS | Status: DC

## 2020-09-13 MED ORDER — LIDOCAINE 2% (20 MG/ML) 5 ML SYRINGE
INTRAMUSCULAR | Status: AC
  Filled 2020-09-13: qty 5

## 2020-09-13 MED ORDER — BUPIVACAINE HCL (PF) 0.25 % IJ SOLN
INTRAMUSCULAR | Status: AC
  Filled 2020-09-13: qty 30

## 2020-09-13 MED ORDER — MIDAZOLAM HCL 2 MG/2ML IJ SOLN
INTRAMUSCULAR | Status: AC
  Filled 2020-09-13: qty 2

## 2020-09-13 MED ORDER — HEPARIN SOD (PORK) LOCK FLUSH 100 UNIT/ML IV SOLN
INTRAVENOUS | Status: AC
  Filled 2020-09-13: qty 5

## 2020-09-13 MED ORDER — ONDANSETRON HCL 4 MG/2ML IJ SOLN
INTRAMUSCULAR | Status: AC
  Filled 2020-09-13: qty 2

## 2020-09-13 MED ORDER — BUPIVACAINE HCL (PF) 0.25 % IJ SOLN
INTRAMUSCULAR | Status: DC | PRN
  Administered 2020-09-13: 10 mL

## 2020-09-13 MED ORDER — ACETAMINOPHEN 10 MG/ML IV SOLN
INTRAVENOUS | Status: AC
  Filled 2020-09-13: qty 100

## 2020-09-13 MED ORDER — FENTANYL CITRATE (PF) 100 MCG/2ML IJ SOLN
INTRAMUSCULAR | Status: DC | PRN
  Administered 2020-09-13 (×2): 25 ug via INTRAVENOUS
  Administered 2020-09-13: 50 ug via INTRAVENOUS

## 2020-09-13 SURGICAL SUPPLY — 52 items
ADH SKN CLS APL DERMABOND .7 (GAUZE/BANDAGES/DRESSINGS) ×1
APL PRP STRL LF DISP 70% ISPRP (MISCELLANEOUS) ×1
APL SKNCLS STERI-STRIP NONHPOA (GAUZE/BANDAGES/DRESSINGS) ×1
BAG DECANTER FOR FLEXI CONT (MISCELLANEOUS) ×2 IMPLANT
BENZOIN TINCTURE PRP APPL 2/3 (GAUZE/BANDAGES/DRESSINGS) ×2 IMPLANT
BLADE SURG 11 STRL SS (BLADE) ×2 IMPLANT
BLADE SURG 15 STRL LF DISP TIS (BLADE) ×1 IMPLANT
BLADE SURG 15 STRL SS (BLADE) ×2
CANISTER SUCT 1200ML W/VALVE (MISCELLANEOUS) IMPLANT
CHLORAPREP W/TINT 26 (MISCELLANEOUS) ×2 IMPLANT
COVER BACK TABLE 60X90IN (DRAPES) ×2 IMPLANT
COVER MAYO STAND STRL (DRAPES) ×2 IMPLANT
COVER PROBE 5X48 (MISCELLANEOUS) ×2
COVER WAND RF STERILE (DRAPES) IMPLANT
DECANTER SPIKE VIAL GLASS SM (MISCELLANEOUS) IMPLANT
DERMABOND ADVANCED (GAUZE/BANDAGES/DRESSINGS) ×1
DERMABOND ADVANCED .7 DNX12 (GAUZE/BANDAGES/DRESSINGS) ×1 IMPLANT
DRAPE C-ARM 42X72 X-RAY (DRAPES) ×2 IMPLANT
DRAPE LAPAROSCOPIC ABDOMINAL (DRAPES) ×2 IMPLANT
DRAPE UTILITY XL STRL (DRAPES) ×2 IMPLANT
DRSG TEGADERM 4X4.75 (GAUZE/BANDAGES/DRESSINGS) IMPLANT
ELECT COATED BLADE 2.86 ST (ELECTRODE) ×2 IMPLANT
ELECT REM PT RETURN 9FT ADLT (ELECTROSURGICAL) ×2
ELECTRODE REM PT RTRN 9FT ADLT (ELECTROSURGICAL) ×1 IMPLANT
GAUZE SPONGE 4X4 12PLY STRL LF (GAUZE/BANDAGES/DRESSINGS) ×2 IMPLANT
GLOVE BIO SURGEON STRL SZ7 (GLOVE) ×2 IMPLANT
GLOVE BIOGEL PI IND STRL 7.5 (GLOVE) ×1 IMPLANT
GLOVE BIOGEL PI INDICATOR 7.5 (GLOVE) ×1
GOWN STRL REUS W/ TWL LRG LVL3 (GOWN DISPOSABLE) ×2 IMPLANT
GOWN STRL REUS W/TWL LRG LVL3 (GOWN DISPOSABLE) ×4
IV KIT MINILOC 20X1 SAFETY (NEEDLE) IMPLANT
KIT CVR 48X5XPRB PLUP LF (MISCELLANEOUS) IMPLANT
KIT PORT POWER 8FR ISP CVUE (Port) ×1 IMPLANT
NDL HYPO 25X1 1.5 SAFETY (NEEDLE) ×1 IMPLANT
NDL SAFETY ECLIPSE 18X1.5 (NEEDLE) IMPLANT
NEEDLE HYPO 18GX1.5 SHARP (NEEDLE)
NEEDLE HYPO 25X1 1.5 SAFETY (NEEDLE) ×2 IMPLANT
PACK BASIN DAY SURGERY FS (CUSTOM PROCEDURE TRAY) ×2 IMPLANT
PENCIL SMOKE EVACUATOR (MISCELLANEOUS) ×2 IMPLANT
SLEEVE SCD COMPRESS KNEE MED (MISCELLANEOUS) ×2 IMPLANT
STRIP CLOSURE SKIN 1/2X4 (GAUZE/BANDAGES/DRESSINGS) ×2 IMPLANT
SUT MNCRL AB 4-0 PS2 18 (SUTURE) ×2 IMPLANT
SUT PROLENE 2 0 SH DA (SUTURE) ×2 IMPLANT
SUT SILK 2 0 TIES 17X18 (SUTURE)
SUT SILK 2-0 18XBRD TIE BLK (SUTURE) IMPLANT
SUT VIC AB 3-0 SH 27 (SUTURE) ×2
SUT VIC AB 3-0 SH 27X BRD (SUTURE) ×1 IMPLANT
SYR 5ML LUER SLIP (SYRINGE) ×2 IMPLANT
SYR CONTROL 10ML LL (SYRINGE) ×2 IMPLANT
TOWEL GREEN STERILE FF (TOWEL DISPOSABLE) ×2 IMPLANT
TUBE CONNECTING 20X1/4 (TUBING) IMPLANT
YANKAUER SUCT BULB TIP NO VENT (SUCTIONS) IMPLANT

## 2020-09-13 NOTE — Interval H&P Note (Signed)
History and Physical Interval Note:  09/13/2020 10:19 AM  Erica Wood  has presented today for surgery, with the diagnosis of BREAST CANCER.  The various methods of treatment have been discussed with the patient and family. After consideration of risks, benefits and other options for treatment, the patient has consented to  Procedure(s): INSERTION PORT-A-CATH WITH ULTRASOUND GUIDANCE (N/A) as a surgical intervention.  The patient's history has been reviewed, patient examined, no change in status, stable for surgery.  I have reviewed the patient's chart and labs.  Questions were answered to the patient's satisfaction.     Rolm Bookbinder

## 2020-09-13 NOTE — Transfer of Care (Signed)
Immediate Anesthesia Transfer of Care Note  Patient: Erica Wood  Procedure(s) Performed: INSERTION PORT-A-CATH WITH ULTRASOUND GUIDANCE (N/A )  Patient Location: PACU  Anesthesia Type:General  Level of Consciousness: sedated  Airway & Oxygen Therapy: Patient Spontanous Breathing and Patient connected to face mask oxygen  Post-op Assessment: Report given to RN and Post -op Vital signs reviewed and stable  Post vital signs: Reviewed and stable  Last Vitals:  Vitals Value Taken Time  BP 86/50 09/13/20 1125  Temp    Pulse 63 09/13/20 1127  Resp 22 09/13/20 1127  SpO2 100 % 09/13/20 1127  Vitals shown include unvalidated device data.  Last Pain:  Vitals:   09/13/20 0928  TempSrc: Oral  PainSc: 0-No pain      Patients Stated Pain Goal: 2 (25/48/62 8241)  Complications: No complications documented.

## 2020-09-13 NOTE — Anesthesia Postprocedure Evaluation (Signed)
Anesthesia Post Note  Patient: Erica Wood  Procedure(s) Performed: INSERTION PORT-A-CATH WITH ULTRASOUND GUIDANCE (N/A )     Patient location during evaluation: PACU Anesthesia Type: General Level of consciousness: awake and alert Pain management: pain level controlled Vital Signs Assessment: post-procedure vital signs reviewed and stable Respiratory status: spontaneous breathing, nonlabored ventilation, respiratory function stable and patient connected to nasal cannula oxygen Cardiovascular status: blood pressure returned to baseline and stable Postop Assessment: no apparent nausea or vomiting Anesthetic complications: no   No complications documented.  Last Vitals:  Vitals:   09/13/20 1215 09/13/20 1233  BP: 108/68 120/77  Pulse: 67 71  Resp: 16 16  Temp:  36.6 C  SpO2: 100% 100%    Last Pain:  Vitals:   09/13/20 1233  TempSrc:   PainSc: 4                  Adley Castello L Jarin Cornfield

## 2020-09-13 NOTE — Discharge Instructions (Signed)
PORT-A-CATH: POST OP INSTRUCTIONS  Always review your discharge instruction sheet given to you by the facility where your surgery was performed.   1. A prescription for pain medication may be given to you upon discharge. Take your pain medication as prescribed, if needed. If narcotic pain medicine is not needed, then you make take acetaminophen (Tylenol) or ibuprofen (Advil) as needed.  2. Take your usually prescribed medications unless otherwise directed. 3. If you need a refill on your pain medication, please contact our office. All narcotic pain medicine now requires a paper prescription.  Phoned in and fax refills are no longer allowed by law.  Prescriptions will not be filled after 5 pm or on weekends.  4. You should follow a light diet for the remainder of the day after your procedure. 5. Most patients will experience some mild swelling and/or bruising in the area of the incision. It may take several days to resolve. 6. It is common to experience some constipation if taking pain medication after surgery. Increasing fluid intake and taking a stool softener (such as Colace) will usually help or prevent this problem from occurring. A mild laxative (Milk of Magnesia or Miralax) should be taken according to package directions if there are no bowel movements after 48 hours.  7. Unless discharge instructions indicate otherwise, you may remove your bandages 48 hours after surgery, and you may shower at that time. You may have steri-strips (small white skin tapes) in place directly over the incision.  These strips should be left on the skin for 7-10 days.  If your surgeon used Dermabond (skin glue) on the incision, you may shower in 24 hours.  The glue will flake off over the next 2-3 weeks.  8. If your port is left accessed at the end of surgery (needle left in port), the dressing cannot get wet and should only by changed by a healthcare professional. When the port is no longer accessed (when the  needle has been removed), follow step 7.   9. ACTIVITIES:  Limit activity involving your arms for the next 72 hours. Do no strenuous exercise or activity for 1 week. You may drive when you are no longer taking prescription pain medication, you can comfortably wear a seatbelt, and you can maneuver your car. 10.You may need to see your doctor in the office for a follow-up appointment.  Please       check with your doctor.  11.When you receive a new Port-a-Cath, you will get a product guide and        ID card.  Please keep them in case you need them.  WHEN TO CALL YOUR DOCTOR [phone]): 1. Fever over 101.0 2. Chills 3. Continued bleeding from incision 4. Increased redness and tenderness at the site 5. Shortness of breath, difficulty breathing   The clinic staff is available to answer your questions during regular business hours. Please don't hesitate to call and ask to speak to one of the nurses or medical assistants for clinical concerns. If you have a medical emergency, go to the nearest emergency room or call 911.  A surgeon from [facility] is always on call at the hospital.     For further information, please visit [email or web address]   No tylenol until 5pm   Post Anesthesia Home Care Instructions  Activity: Get plenty of rest for the remainder of the day. A responsible individual must stay with you for 24 hours following the procedure.  For the next 24  hours, DO NOT: -Drive a car -Paediatric nurse -Drink alcoholic beverages -Take any medication unless instructed by your physician -Make any legal decisions or sign important papers.  Meals: Start with liquid foods such as gelatin or soup. Progress to regular foods as tolerated. Avoid greasy, spicy, heavy foods. If nausea and/or vomiting occur, drink only clear liquids until the nausea and/or vomiting subsides. Call your physician if vomiting continues.  Special Instructions/Symptoms: Your throat  may feel dry or sore from the anesthesia or the breathing tube placed in your throat during surgery. If this causes discomfort, gargle with warm salt water. The discomfort should disappear within 24 hours.  If you had a scopolamine patch placed behind your ear for the management of post- operative nausea and/or vomiting:  1. The medication in the patch is effective for 72 hours, after which it should be removed.  Wrap patch in a tissue and discard in the trash. Wash hands thoroughly with soap and water. 2. You may remove the patch earlier than 72 hours if you experience unpleasant side effects which may include dry mouth, dizziness or visual disturbances. 3. Avoid touching the patch. Wash your hands with soap and water after contact with the patch.

## 2020-09-13 NOTE — Anesthesia Procedure Notes (Signed)
Procedure Name: LMA Insertion Date/Time: 09/13/2020 11:46 AM Performed by: Maryella Shivers, CRNA Pre-anesthesia Checklist: Patient identified, Emergency Drugs available, Suction available and Patient being monitored Patient Re-evaluated:Patient Re-evaluated prior to induction Oxygen Delivery Method: Circle system utilized Preoxygenation: Pre-oxygenation with 100% oxygen Induction Type: IV induction Ventilation: Mask ventilation without difficulty LMA: LMA inserted LMA Size: 4.0 Number of attempts: 1 Airway Equipment and Method: Bite block Placement Confirmation: positive ETCO2 Tube secured with: Tape Dental Injury: Teeth and Oropharynx as per pre-operative assessment

## 2020-09-13 NOTE — Interval H&P Note (Signed)
History and Physical Interval Note:  09/13/2020 10:27 AM  Erica Wood  has presented today for surgery, with the diagnosis of BREAST CANCER.  The various methods of treatment have been discussed with the patient and family. After consideration of risks, benefits and other options for treatment, the patient has consented to  Procedure(s): INSERTION PORT-A-CATH WITH ULTRASOUND GUIDANCE (N/A) as a surgical intervention.  The patient's history has been reviewed, patient examined, no change in status, stable for surgery.  I have reviewed the patient's chart and labs.  Questions were answered to the patient's satisfaction.     Rolm Bookbinder

## 2020-09-13 NOTE — Anesthesia Preprocedure Evaluation (Addendum)
Anesthesia Evaluation  Patient identified by MRN, date of birth, ID band Patient awake    Reviewed: Allergy & Precautions, NPO status , Patient's Chart, lab work & pertinent test results  Airway Mallampati: I  TM Distance: >3 FB Neck ROM: Full    Dental no notable dental hx. (+) Teeth Intact, Dental Advisory Given   Pulmonary former smoker, PE   Pulmonary exam normal breath sounds clear to auscultation       Cardiovascular + DVT  Normal cardiovascular exam Rhythm:Regular Rate:Normal  TTE 2021 unremarkable   Neuro/Psych PSYCHIATRIC DISORDERS Anxiety Depression negative neurological ROS     GI/Hepatic negative GI ROS, Neg liver ROS,   Endo/Other  Lupus, anti-cardiolipin antibody  Renal/GU negative Renal ROS  negative genitourinary   Musculoskeletal  (+) Arthritis ,   Abdominal   Peds  Hematology On coumadin   Anesthesia Other Findings   Reproductive/Obstetrics                            Anesthesia Physical Anesthesia Plan  ASA: II  Anesthesia Plan: General   Post-op Pain Management:    Induction: Intravenous  PONV Risk Score and Plan: 3 and Ondansetron, Dexamethasone and Midazolam  Airway Management Planned: LMA  Additional Equipment:   Intra-op Plan:   Post-operative Plan: Extubation in OR  Informed Consent: I have reviewed the patients History and Physical, chart, labs and discussed the procedure including the risks, benefits and alternatives for the proposed anesthesia with the patient or authorized representative who has indicated his/her understanding and acceptance.     Dental advisory given  Plan Discussed with: CRNA  Anesthesia Plan Comments:         Anesthesia Quick Evaluation

## 2020-09-13 NOTE — Telephone Encounter (Signed)
Peer to Peer completed for patient.  CT scan w/wo contrast of the head authorized.  #[id number] Exp 10/28/2020  Time Spent: 15 min  Wilber Bihari, NP

## 2020-09-13 NOTE — H&P (Signed)
66 yof with history of right breast mass for some years that has been increasing in size lately. no pain. no nipple dc. she has no family history and no prior breast history. she has mm from gyn office that is negative by report but I do not have the films. she also has long term history of IBS. has prior elap and open appy. elap was for likely colonoscopic perforation for which she states was not able to be found. she now notes a bulge in the rlq that causes some discomfort. she is on coumadin for APL syndrome/lupus/dvts. I sent her for mm/us after I saw her as well as ct for possible hernia. the right breast mass is a 2.6x2.1x1.6cm with some calcs. axillary [place] is negative. biopsy of the breast mass is a triple positive grade III IDC with Ki of 40%. biopsy of surrounding calcs are benign concordant. MRI showed a right breast mass at 6 oclock measuring 2.6 cm, additional 5 mm mass right LIQ, 1.2 cm right UIQ mass, right UOQ mass measuring 7 mm. left breast normal and no lymphadenopathy with some enhancement of sternum. The ct a/p for the hernia was negative except for a couple indeterminate liver lesions with mr recommended. she is here to see [place] in [place] today  Past Surgical History  Appendectomy  Oral Surgery   Diagnostic Studies History  Colonoscopy  >10 years ago Mammogram  within last year  Allergies  Sulfacetamide *CHEMICALS*  Allergies Reconciled   Medication History  Warfarin Sodium (5MG  Tablet, Oral) Active. Euflexxa (20MG /2ML Soln Pref Syr, Intra-articular) Active. ALPRAZolam (0.5MG  Tablet, Oral) Active. Amphetamine-Dextroamphet ER (20MG  Capsule ER 24HR, Oral) Active. Hydroxychloroquine Sulfate (200MG  Tablet, Oral) Active. Xarelto (15MG  Tablet, Oral) Active. Tretinoin (0.01% Gel, External) Active. Camila (0.35MG  Tablet, Oral) Active. Aldactone (25MG  Tablet, Oral) Active. Adderall (20MG  Tablet, Oral) Active. Medications Reconciled  Social History   Alcohol use  Occasional alcohol use. Caffeine use  Carbonated beverages, Coffee, Tea. No drug use   Family History  Arthritis  Father. Cancer  Sister.  Pregnancy / Birth History Age at menarche  70 years. Contraceptive History  Oral contraceptives. Gravida  0 Para  0 Regular periods   Other Problems  Anxiety Disorder  Arthritis  Back Pain  Chest pain  Hemorrhoids  Lump In Breast  Migraine Headache  Other disease, cancer, significant illness  Pulmonary Embolism / Blood Clot in Legs  Vascular Disease    Review of Systems  General Present- Fatigue and Weight Gain. Not Present- Appetite Loss, Chills, Fever, Night Sweats and Weight Loss. Skin Not Present- Change in Wart/Mole, Dryness, Hives, Jaundice, New Lesions, Non-Healing Wounds, Rash and Ulcer. HEENT Present- Wears glasses/contact lenses. Not Present- Earache, Hearing Loss, Hoarseness, Nose Bleed, Oral Ulcers, Ringing in the Ears, Seasonal Allergies, Sinus Pain, Sore Throat, Visual Disturbances and Yellow Eyes. Respiratory Not Present- Bloody sputum, Chronic Cough, Difficulty Breathing, Snoring and Wheezing. Breast Present- Breast Mass. Not Present- Breast Pain, Nipple Discharge and Skin Changes. Cardiovascular Present- Swelling of Extremities. Not Present- Chest Pain, Difficulty Breathing Lying Down, Leg Cramps, Palpitations, Rapid Heart Rate and Shortness of Breath. Gastrointestinal Present- Abdominal Pain, Bloating, Constipation and Excessive gas. Not Present- Bloody Stool, Change in Bowel Habits, Chronic diarrhea, Difficulty Swallowing, Gets full quickly at meals, Hemorrhoids, Indigestion, Nausea, Rectal Pain and Vomiting. Female Genitourinary Not Present- Frequency, Nocturia, Painful Urination, Pelvic Pain and Urgency. Musculoskeletal Present- Back Pain, Joint Stiffness and Swelling of Extremities. Not Present- Joint Pain, Muscle Pain and Muscle Weakness. Neurological Present- Headaches. Not  Present-  Decreased Memory, Fainting, Numbness, Seizures, Tingling, Tremor, Trouble walking and Weakness. Psychiatric Present- Anxiety and Change in Sleep Pattern. Not Present- Bipolar, Depression, Fearful and Frequent crying. Endocrine Present- Cold Intolerance and Hair Changes. Not Present- Excessive Hunger, Heat Intolerance, Hot flashes and New Diabetes. Hematology Present- Blood Thinners. Not Present- Easy Bruising, Excessive bleeding, Gland problems, HIV and Persistent Infections.    Physical Exam  General Mental Status-Alert. Orientation-Oriented X3. Breast Nipples-No Discharge. Note: 2 cm mobile right breast mass central lower right breast Abdomen Note: well healed midline and rlq incisions, ? incisional hernia rlq Lymphatic Head & Neck General Head & Neck Lymphatics: Bilateral - Description - Normal. Axillary General Axillary Region: Bilateral - Description - Normal. Note: no [place] adenopathy   Assessment & Plan Rolm Bookbinder MD; 08/16/2020 3:54 PM) CANCER OF CENTRAL PORTION OF BREAST (C50.119) Port placement for systemic therapy for stage IV disease

## 2020-09-13 NOTE — Op Note (Signed)
Preoperative diagnosis:stage Iv breast cancer Postoperative diagnosis: saa Procedure: Right IJ port placement Surgeon: Dr Serita Grammes EBL: minimal Anesthesia: general  Complications none Drains none Specimens:none Sponge and needle count correct times two dispo to recovery stable  Indications:54 yof with likely new stage IV breast cancer. Here for port placement for systemic therapy.   Procedure:After informed consent was obtained the patient was taken to the operating room. She was given antibiotics. SCDs were placed. She was placed under general anesthesia without complication. She was prepped and draped in the standard sterile surgical fashion. A surgical timeout was then performed.  I identified the internal jugular vein on the right side with the ultrasound. I made a small nick in the skin. I accessed the internal jugular vein with the needle under ultrasound guidance. I passed the wire and confirmed it was in position by [place] and by fluoro. Imade anincision and developed a subcutaneous pocket for the port. I then tunneled between the port site as well as the insertion site. I brought the line through this. I then placed the dilator under fluoroscopic guidance over the wire. I removedthe wire andthe dilator. I then placed the line into the sheath. The sheath was then removed. I pulled the line back to be in the distal vena cava. I then hooked this up to the port. This was placed in the pocket and sutured in place with 2-0 Prolene suture. I then closed this with 3-0 Vicryl and 4-0 Monocryl. Glue was placed. I accessed this. It withdrew blood and flushed easily. I packed it with heparin.

## 2020-09-14 ENCOUNTER — Encounter (HOSPITAL_BASED_OUTPATIENT_CLINIC_OR_DEPARTMENT_OTHER): Payer: Self-pay | Admitting: General Surgery

## 2020-09-14 ENCOUNTER — Ambulatory Visit (HOSPITAL_COMMUNITY)
Admission: RE | Admit: 2020-09-14 | Discharge: 2020-09-14 | Disposition: A | Discharge status: Home / Self Care | Payer: 59 | Source: Ambulatory Visit | Attending: Oncology | Admitting: Oncology

## 2020-09-14 ENCOUNTER — Telehealth: Payer: Self-pay

## 2020-09-14 DIAGNOSIS — C50511 Malignant neoplasm of lower-outer quadrant of right female breast: Secondary | ICD-10-CM | POA: Diagnosis present

## 2020-09-14 DIAGNOSIS — Z170 Estrogen receptor positive status [ER+]: Secondary | ICD-10-CM | POA: Insufficient documentation

## 2020-09-14 IMAGING — CT CT CHEST W/ CM
2 of 3 series · 15 of 36 positions shown, 18 images · IV contrast (omnipaque)
Comparison: None.

CLINICAL DATA: New diagnosis of right breast cancer. Staging.
Chemotherapy initiated [DATE].

EXAM:
CT CHEST WITH CONTRAST
TECHNIQUE: Multidetector CT imaging of the chest was performed during
intravenous contrast administration.
CONTRAST:  75mL OMNIPAQUE IOHEXOL 300 MG/ML  SOLN

[Series 3: axial st · axial · 0.59mm/px · z∈[-416,-198]mm · 12 of 129 slices shown, 15 images]
[im 10/129  mediastinal]
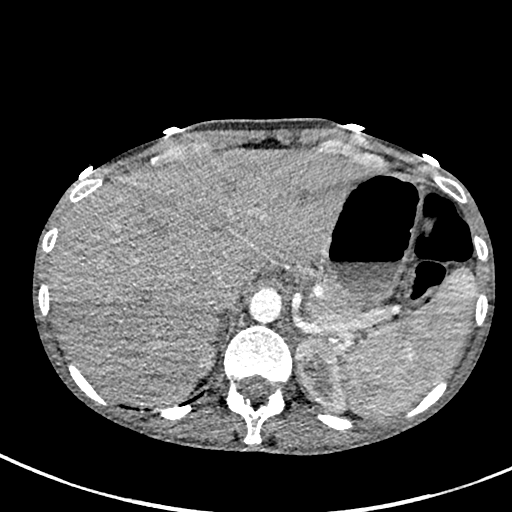
[im 10/129  lung]
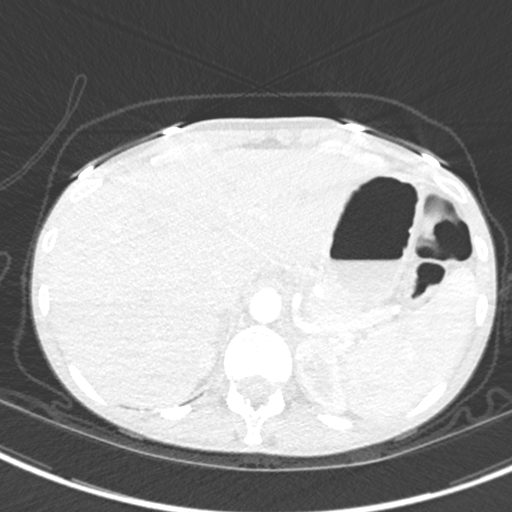
[im 19/129  lung]
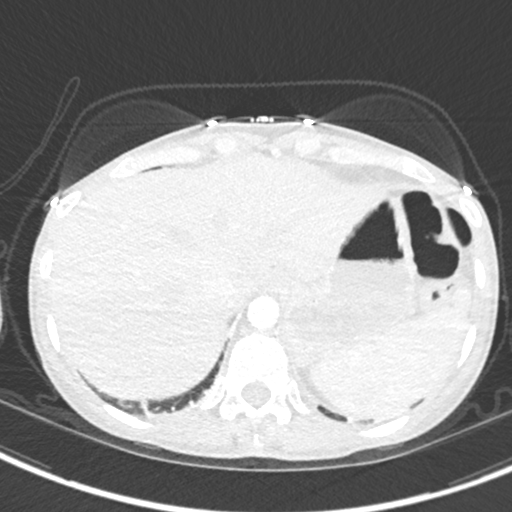
[im 29/129  lung]
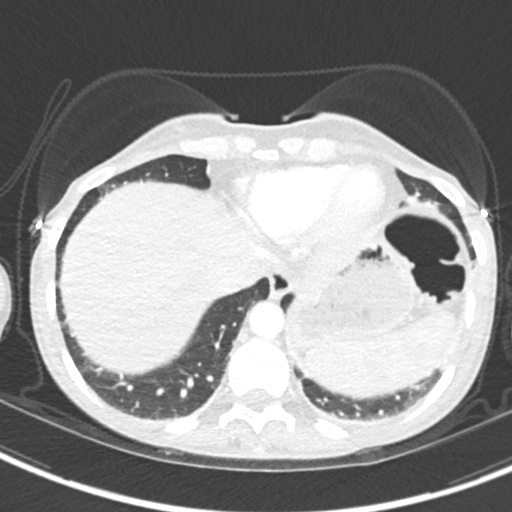
[im 38/129  lung]
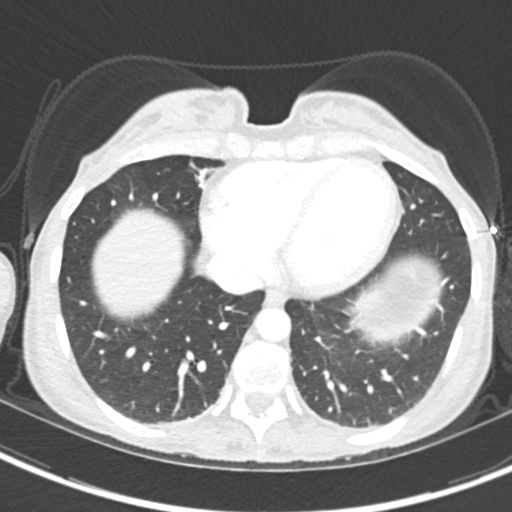
[im 48/129  mediastinal]
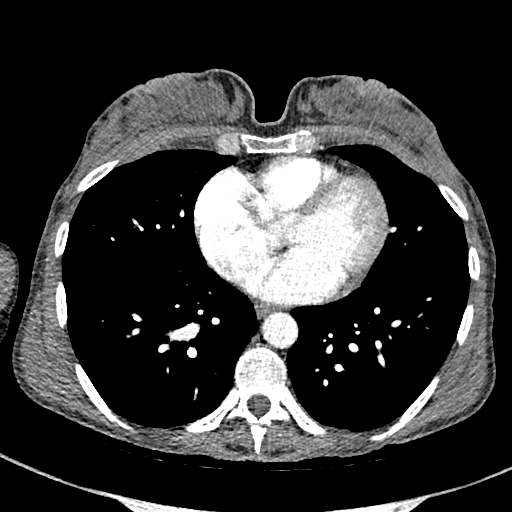
[im 48/129  lung]
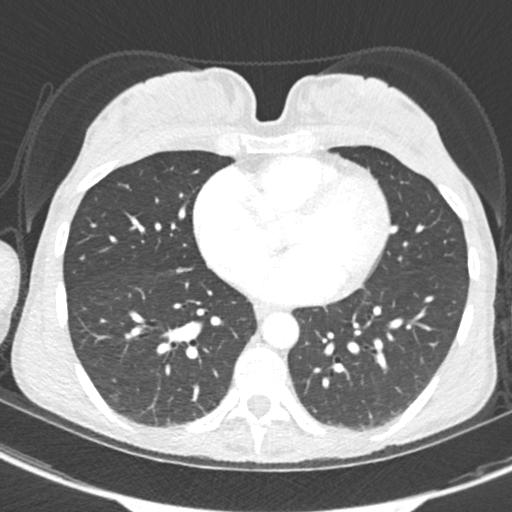
[im 57/129  lung]
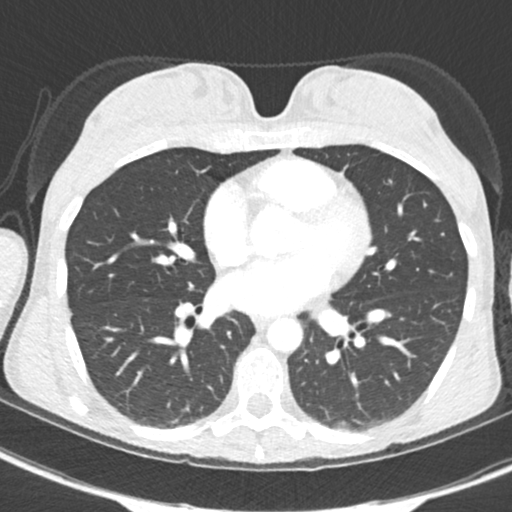
[im 72/129  lung]
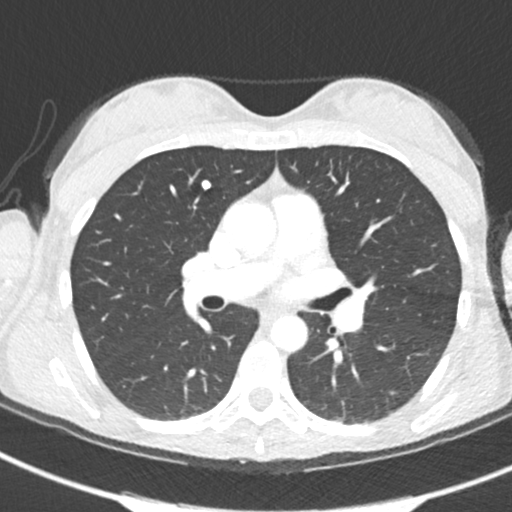
[im 81/129  lung]
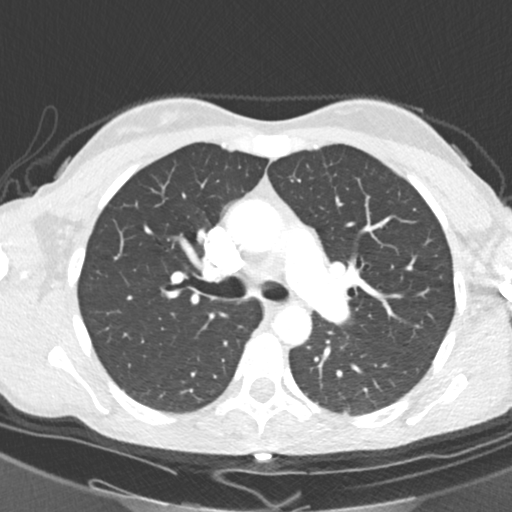
[im 91/129  mediastinal]
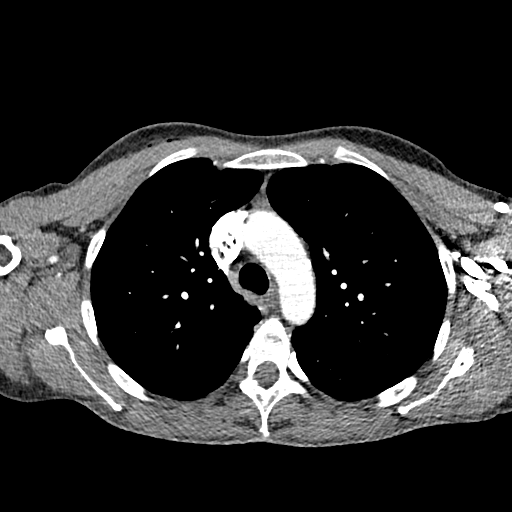
[im 91/129  lung]
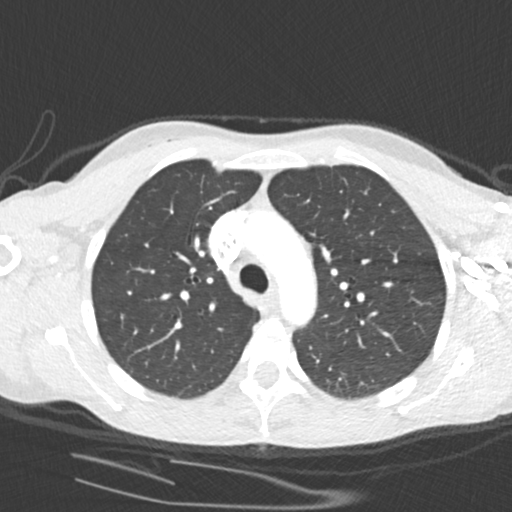
[im 100/129  lung]
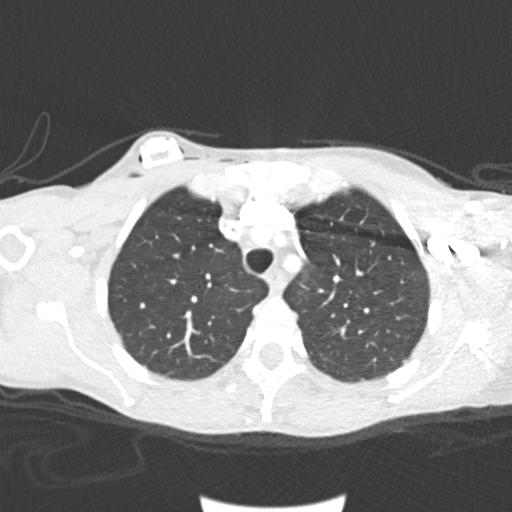
[im 110/129  lung]
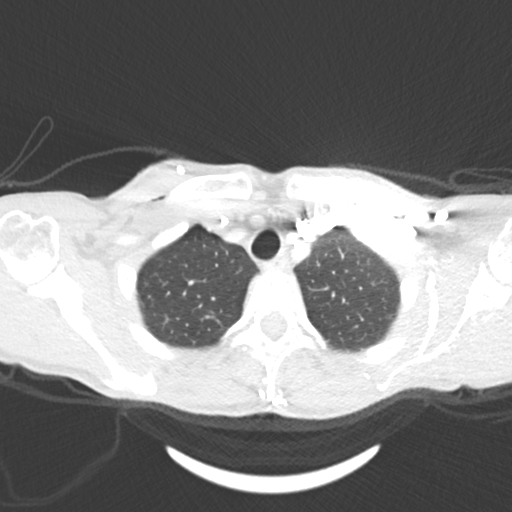
[im 119/129  lung]
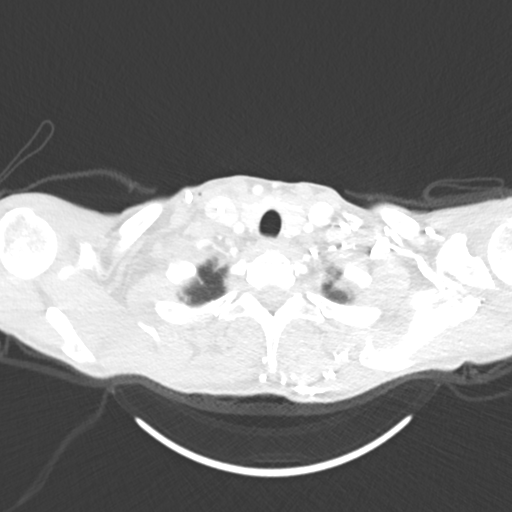

[Series 7: coronal · coronal · 0.54mm/px · 3 of 110 slices shown]
[im 22/110  lung]
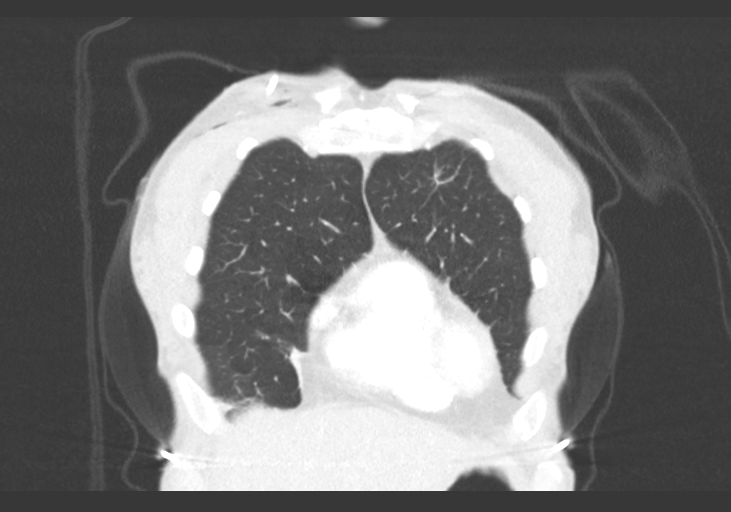
[im 44/110  lung]
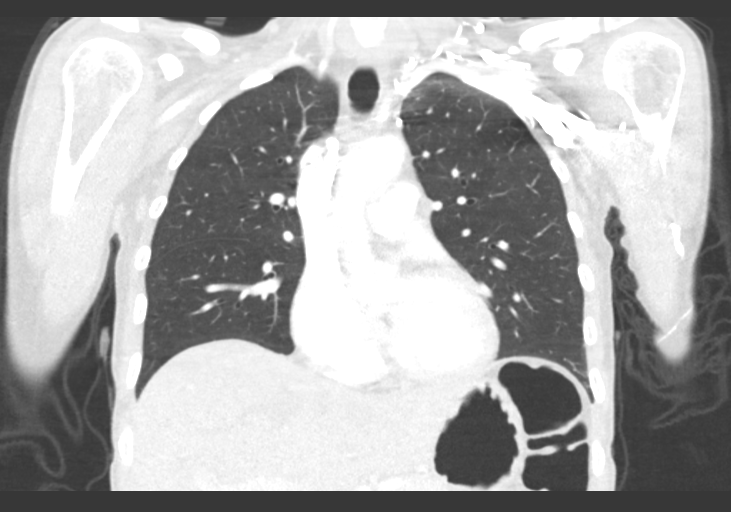
[im 66/110  lung]
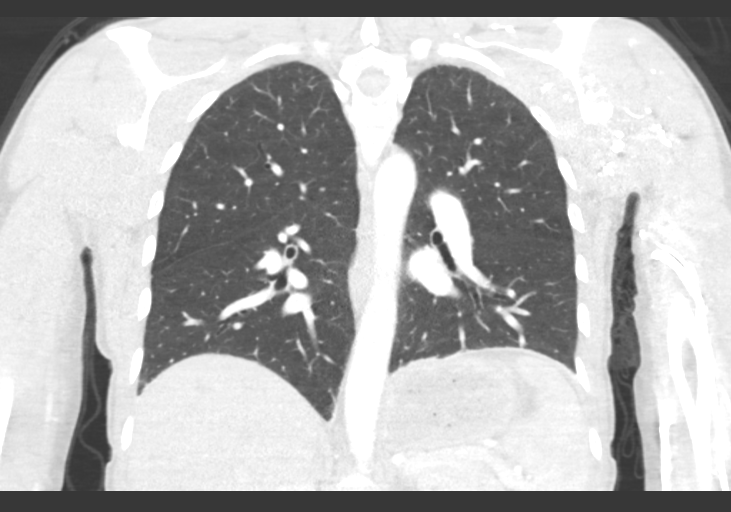

[15 of 36 positions shown; findings below may reference images not displayed]

FINDINGS: Cardiovascular: Normal heart size. No significant pericardial
effusion/thickening. Minimally atherosclerotic nonaneurysmal
thoracic aorta. Normal caliber pulmonary arteries. No central
pulmonary emboli. Right internal jugular Port-A-Cath terminates in
the lower third of the SVC. Subcutaneous emphysema at the port site
compatible with recent placement.

Mediastinum/Nodes: No discrete thyroid nodules. Unremarkable
esophagus. No pathologically enlarged axillary, mediastinal or hilar
lymph nodes.

Lungs/Pleura: No pneumothorax. No pleural effusion. Coarsely
calcified subcentimeter right upper lobe granuloma. Anterior left
upper lobe 0.8 cm pulmonary nodule with coarse internal
calcifications, also compatible with a granuloma. No acute
consolidative airspace disease, lung masses or additional
significant pulmonary nodules.

Upper abdomen: No acute abnormality.

Musculoskeletal:  No aggressive appearing focal osseous lesions.
IMPRESSION: 1. No evidence of metastatic disease in the chest.
2. Aortic Atherosclerosis ([24]-[24]).

## 2020-09-14 IMAGING — NM NM BONE WHOLE BODY
2 series · 2 of 2 positions shown · non-contrast
Comparison: None

Correlation: CT chest [DATE]

CLINICAL DATA: Breast cancer, new diagnosis

EXAM:
NUCLEAR MEDICINE WHOLE BODY BONE SCAN
TECHNIQUE: Whole body anterior and posterior images were obtained approximately
3 hours after intravenous injection of radiopharmaceutical.
RADIOPHARMACEUTICALS:  20.5 mCi [AT] MDP IV

[Series 1: whole body · 2.66mm/px · 1 of 1 slices shown (1 of 2)]
[im 1/1]
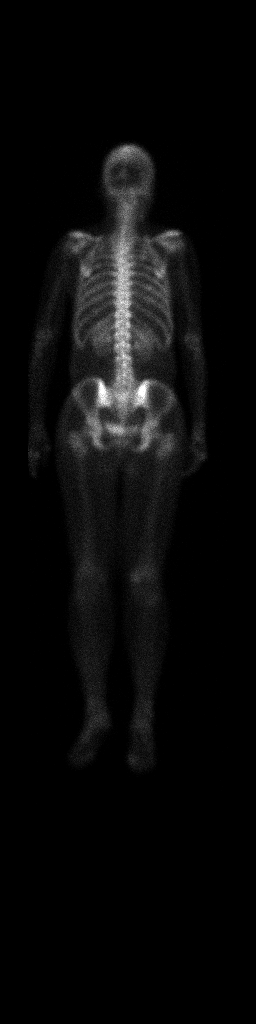

[Series 1: whole body · 2.66mm/px · 1 of 1 slices shown (2 of 2)]
[im 1/1]
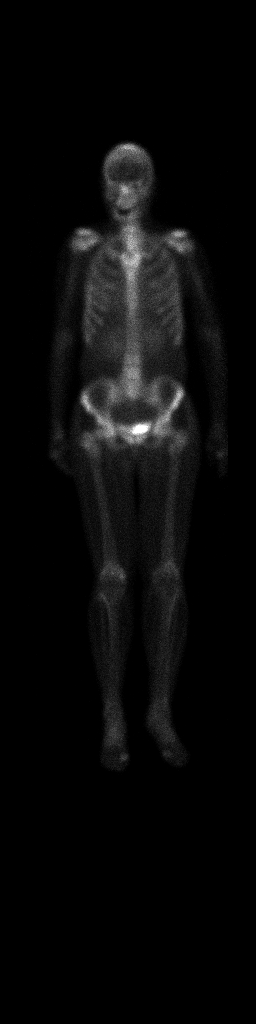

[2 of 2 positions shown; findings below may reference images not displayed]

FINDINGS: Minimal uptake of tracer at the AC joints and sternoclavicular
joints, as well as RIGHT knee likely degenerative.

Mildly inhomogeneous tracer uptake in the posterior ribs
bilaterally, without sclerosis or fractures on CT, may represent
artifact.

No definite sites of abnormal tracer uptake are seen to suggest
osseous metastases.

Expected urinary tract and soft tissue distribution of tracer.
IMPRESSION: No definite scintigraphic evidence of osseous metastatic disease as
above.

## 2020-09-14 IMAGING — CT CT HEAD WO/W CM
3 of 6 series · 15 of 47 positions shown, 18 images · IV contrast (omnipaque)
Comparison: None.

CLINICAL DATA: Breast cancer, staging.

EXAM:
CT HEAD WITHOUT AND WITH CONTRAST
TECHNIQUE: Contiguous axial images were obtained from the base of the skull
through the vertex without and with intravenous contrast
CONTRAST:  75mL OMNIPAQUE IOHEXOL 300 MG/ML  SOLN

[Series 3: head wo · axial · 0.47mm/px · z∈[-80,+45]mm · 10 of 31 slices shown, 13 images]
[im 3/31  brain]
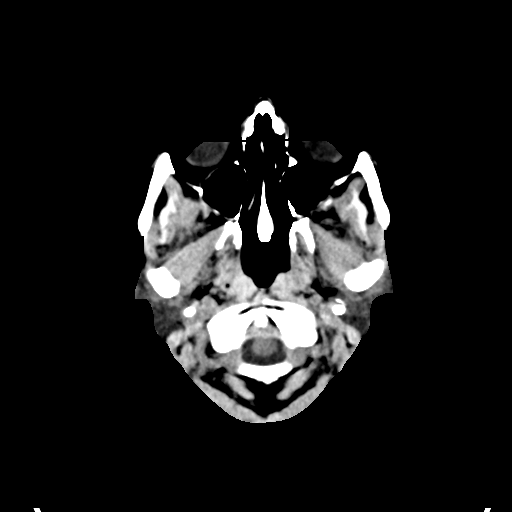
[im 3/31  bone]
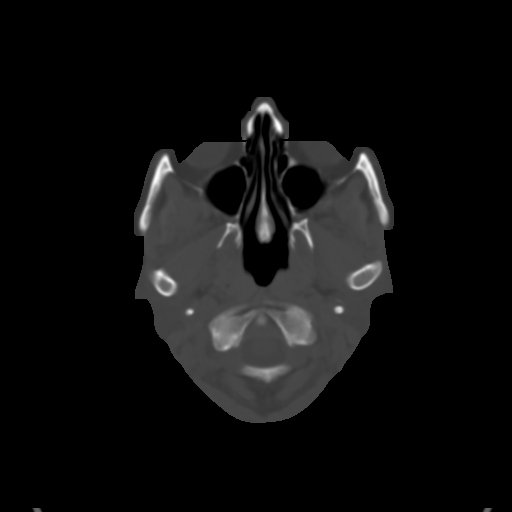
[im 5/31  brain]
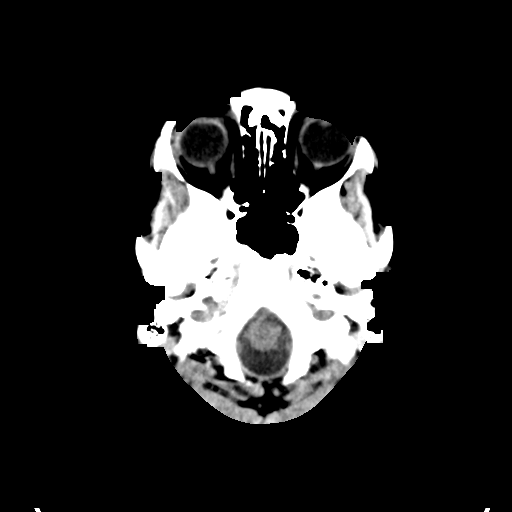
[im 9/31  brain]
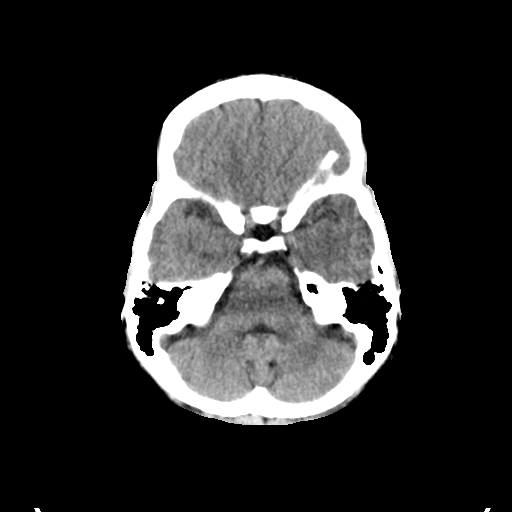
[im 11/31  brain]
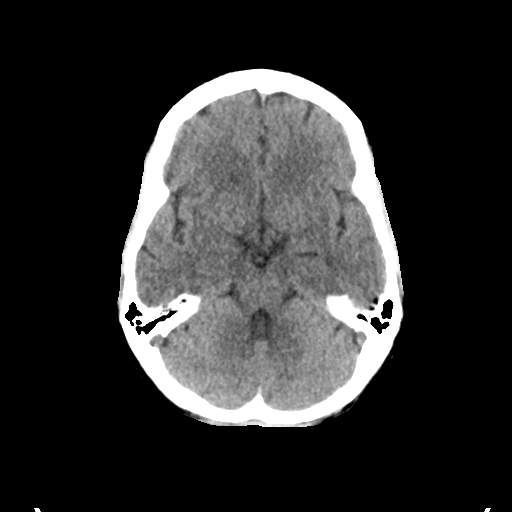
[im 13/31  brain]
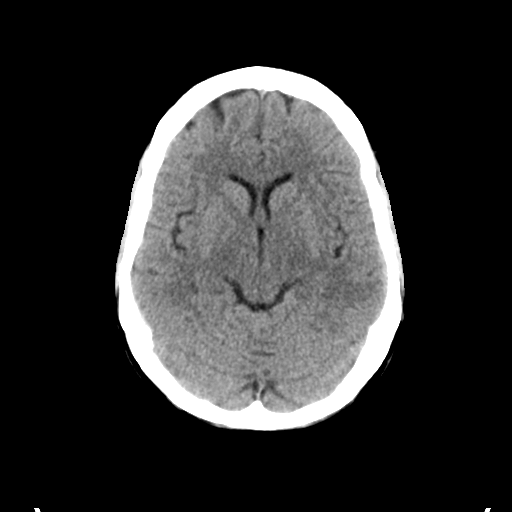
[im 13/31  bone]
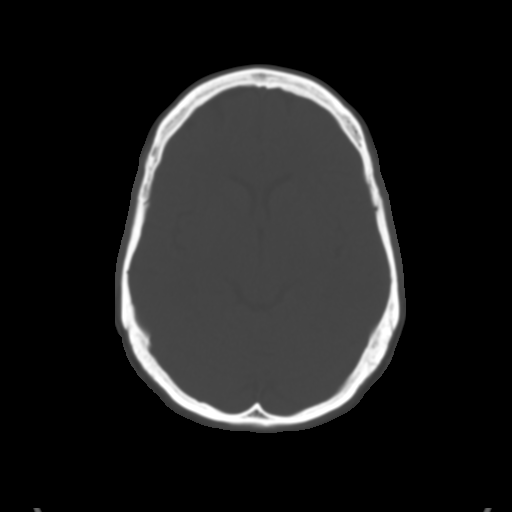
[im 18/31  brain]
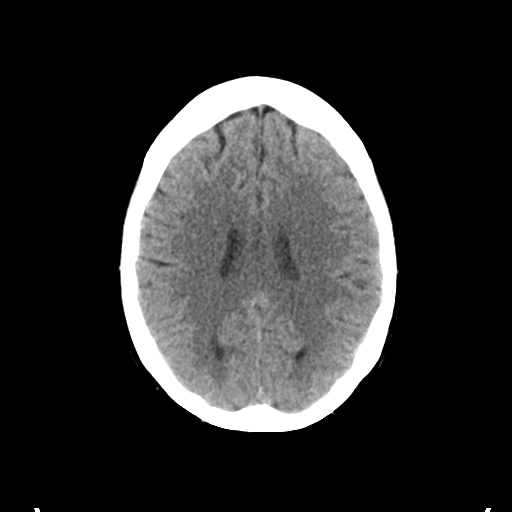
[im 20/31  brain]
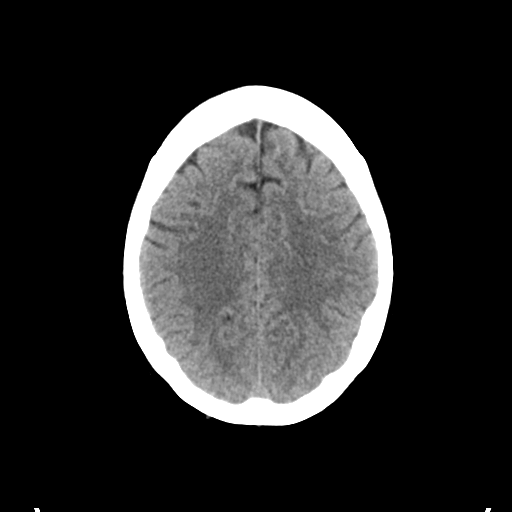
[im 22/31  brain]
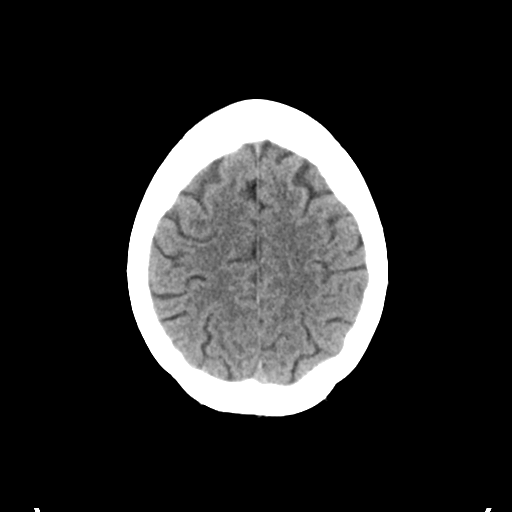
[im 26/31  brain]
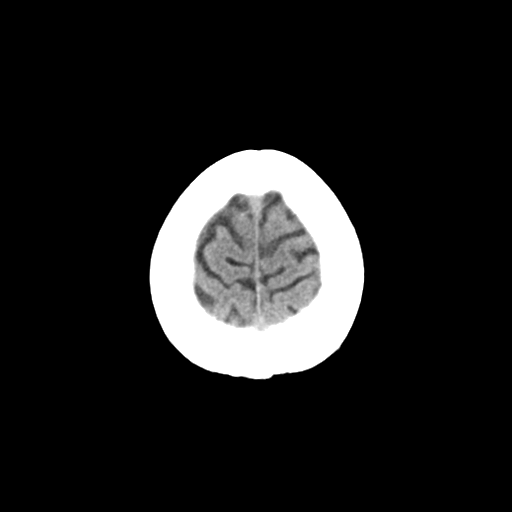
[im 26/31  bone]
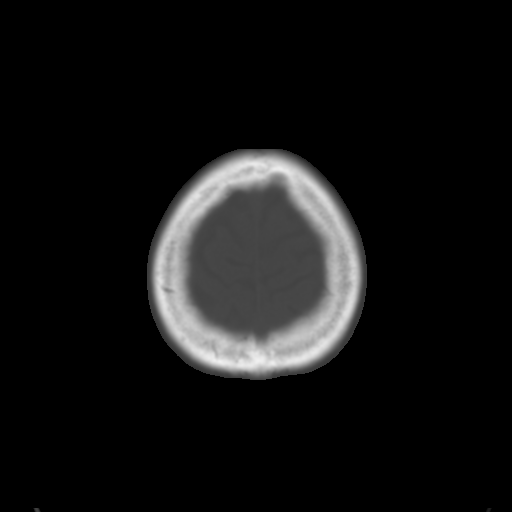
[im 28/31  brain]
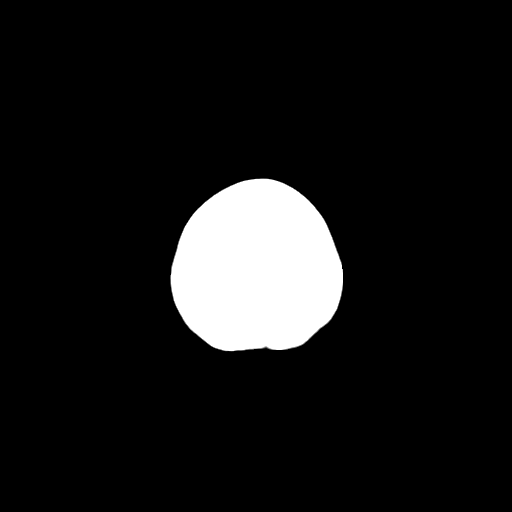

[Series 5: coronal soft tissue · coronal · 0.33mm/px · 3 of 66 slices shown]
[im 17/66  brain]
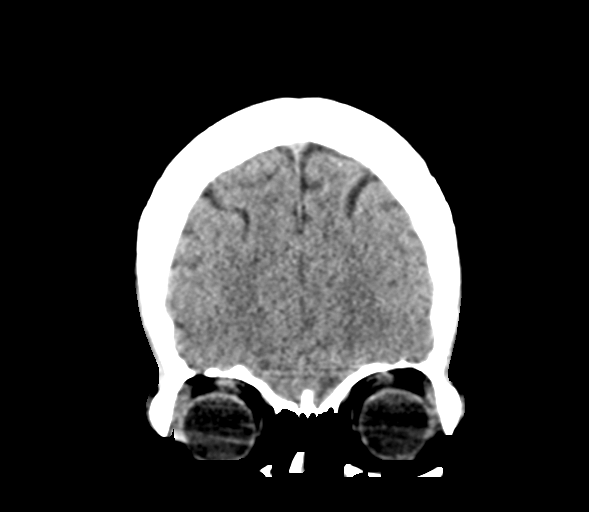
[im 33/66  brain]
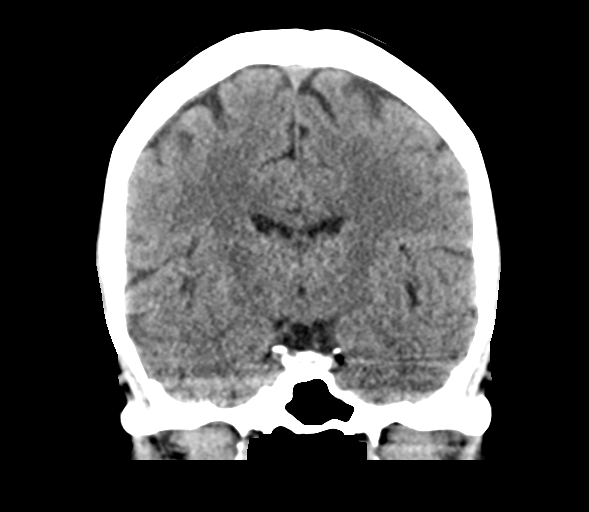
[im 49/66  brain]
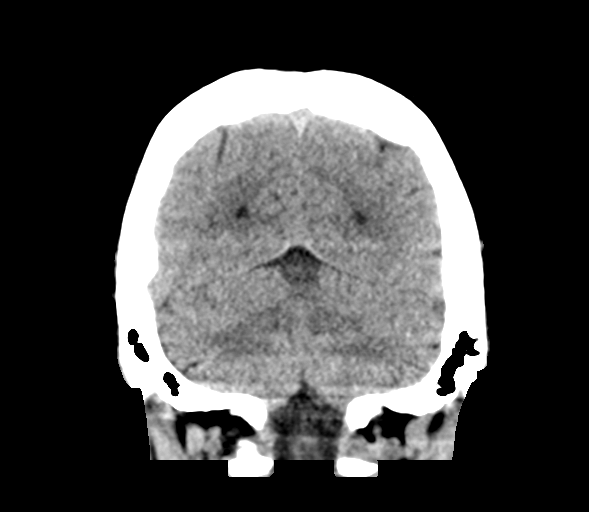

[Series 6: sagittal soft tissue · sagittal · 0.35mm/px · 2 of 51 slices shown]
[im 17/51  brain]
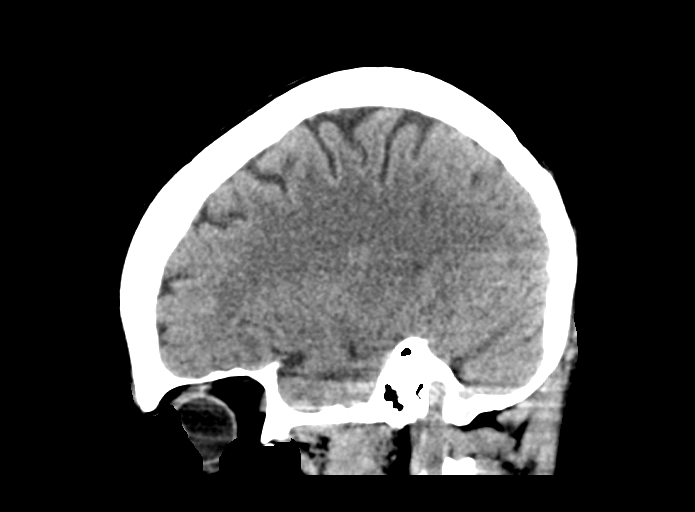
[im 34/51  brain]
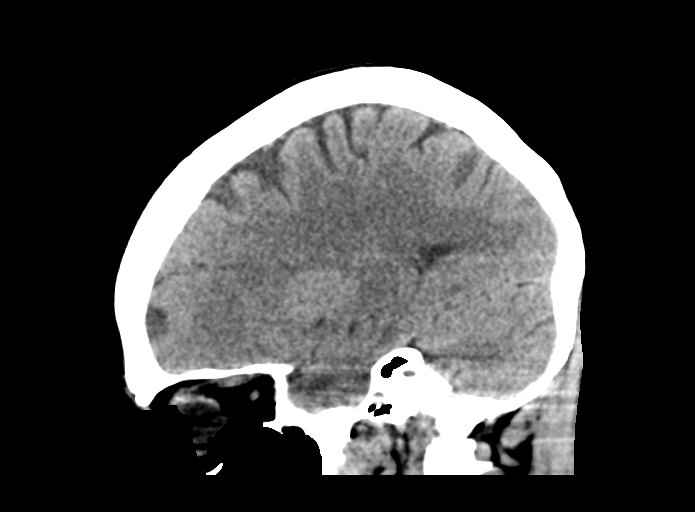

[15 of 47 positions shown; findings below may reference images not displayed]

FINDINGS: Brain: No evidence of acute infarction, hemorrhage, hydrocephalus,
extra-axial collection or mass lesion/mass effect. No focus of
abnormal contrast enhancement.

Vascular: No hyperdense vessel or unexpected calcification. Visible
vessels are patent.

Skull: Normal. Negative for fracture or focal lesion.

Sinuses/Orbits: No acute finding.
IMPRESSION: No evidence of metastatic disease in the head.

Please note that head CT has low sensitivity for small metastatic
lesion. In case of high clinical suspicion, recommend MRI of the
brain with contrast.

## 2020-09-14 MED ORDER — IOHEXOL 300 MG/ML  SOLN
75.0000 mL | Freq: Once | INTRAMUSCULAR | Status: AC | PRN
  Administered 2020-09-14: 75 mL via INTRAVENOUS

## 2020-09-14 MED ORDER — TECHNETIUM TC 99M MEDRONATE IV KIT
20.5000 | PACK | Freq: Once | INTRAVENOUS | Status: AC
  Administered 2020-09-14: 20.5 via INTRAVENOUS

## 2020-09-14 NOTE — Telephone Encounter (Signed)
RN returned call - Pt inquiring if she should contact [facility] to set up delivery of G-CSF medicine - RN provided contact information for [facility] - No further needs.

## 2020-09-15 ENCOUNTER — Other Ambulatory Visit: Payer: Self-pay

## 2020-09-15 ENCOUNTER — Other Ambulatory Visit: Payer: Self-pay | Admitting: Oncology

## 2020-09-15 ENCOUNTER — Encounter: Payer: Self-pay | Admitting: *Deleted

## 2020-09-15 DIAGNOSIS — Z170 Estrogen receptor positive status [ER+]: Secondary | ICD-10-CM

## 2020-09-15 DIAGNOSIS — C50511 Malignant neoplasm of lower-outer quadrant of right female breast: Secondary | ICD-10-CM

## 2020-09-18 ENCOUNTER — Other Ambulatory Visit: Payer: Self-pay | Admitting: Radiology

## 2020-09-19 ENCOUNTER — Other Ambulatory Visit: Payer: Self-pay | Admitting: Student

## 2020-09-19 NOTE — H&P (Signed)
Chief Complaint: Patient was seen in consultation today for liver lesion biopsy  Referring Physician(s): Magrinat, Virgie Dad  Supervising Physician: Aletta Edouard  Patient Status: [facility] - Out-pt  History of Present Illness: Erica Wood is a 54 y.o. female with a medical history significant for lupus, IBS, PE, anxiety, Raynaud's syndrome and a recent diagnosis if right breast cancer. She presented to her PCP June 2021 with a palpable right breast lump. The lump had been present for several years. Diagnostic mammogram and biopsy revealed invasive ductal carcinoma. Additional imaging for staging showed numerous liver lesions.    MR Abdomen 08/24/20: Hepatobiliary: Multiple hypovascular masses are now seen in the caudate, right, and left hepatic lobes. The largest in the caudate lobe measures 2.3 x 1.9 cm. The 2nd largest mass is seen in the anterior liver near the junction of the right and left lobes on image 17/17, which measures 1.2 x 1.2 cm. These findings are highly suspicious for liver metastases. A tiny less than 5 mm gallstone is noted, however there is no evidence of cholecystitis or biliary ductal dilatation.  US Abdomen 09/04/20: Liver: 2.6 cm solid lesion is seen in the region of the caudate lobe which corresponds to metastatic lesion seen on prior MRI. The other metastatic lesions noted on prior MRI are not well visualized on this study. Within normal limits in parenchymal echogenicity. Portal vein is patent on color Doppler imaging with normal direction of blood flow towards the liver.  Interventional Radiology has been asked to evaluate this patient for an image-guided liver lesion biopsy. This case has been reviewed and procedure approved by Dr. Serafina Royals.  Past Medical History:  Diagnosis Date   Anti-cardiolipin antibody positive    Anti-cardiolipin antibody syndrome (HCC)    Anxiety    Arthralgia    Arthritis    bil knees, hands   Atypical  chest pain    Autoimmune disease ([place])    Cancer ([place]) 07/2020   right breast IDC   Chronic fatigue    Depression    DVT (deep venous thrombosis) (HCC)    IBS (irritable bowel syndrome)    with constipation   Lupus ([place])    Pulmonary embolus (HCC)    Raynaud's syndrome    Sicca ([place])    Thrombocytopenia ([place])     Past Surgical History:  Procedure Laterality Date   ABLATION  12/2018   leg veins   ABLATION Right 08/2019   venous leg    APPENDECTOMY     COLON SURGERY     Perforated colon repair after colostomy   GANGLION CYST EXCISION     HYSTEROSCOPY WITH D & C N/A 01/13/2014   Procedure: DILATATION AND CURETTAGE /HYSTEROSCOPY with resection ;  Surgeon: Olga Millers, MD;  Location: [facility];  Service: Gynecology;  Laterality: N/A;   LAPAROTOMY     PORTACATH PLACEMENT N/A 09/13/2020   Procedure: INSERTION PORT-A-CATH WITH ULTRASOUND GUIDANCE;  Surgeon: Rolm Bookbinder, MD;  Location: [facility];  Service: General;  Laterality: N/A;    Allergies: Beef-derived products, Latex, and Sulfa antibiotics  Medications: Prior to Admission medications   Medication Sig Start Date End Date Taking? Authorizing Provider  acetaminophen (TYLENOL) 500 MG tablet Take 1,000 mg by mouth every 6 (six) hours as needed for moderate pain or headache.   Yes [provider]  amphetamine-dextroamphetamine (ADDERALL XR) 20 MG 24 hr capsule Take 1 capsule (20 mg total) by mouth daily. 09/11/20  Yes Bo Merino, MD  Ascorbic  Acid (VITAMIN C PO) Take 3,000 mg by mouth daily.    Yes [provider]  CAMILA 0.35 MG tablet Take 1 tablet by mouth daily. 12/01/16  Yes [provider]  Cholecalciferol (VITAMIN D3) 75 MCG (3000 UT) TABS Take 3,000 Units by mouth daily.   Yes [provider]  dexamethasone (DECADRON) 4 MG tablet Take 2 tablets (8 mg total) by mouth 2 (two) times daily. Start the day before Taxotere. Then take daily x 3 days  after chemotherapy. Patient taking differently: Take 8 mg by mouth See admin instructions. Take 8 mg by mouth twice daily. Start the day before Taxotere. Then take daily x 3 days after chemotherapy. 08/16/20  Yes Magrinat, Virgie Dad, MD  Docusate Sodium (COLACE PO) Take 2 tablets by mouth 2 (two) times daily.   Yes [provider]  lidocaine-prilocaine (EMLA) cream Apply to affected area once Patient taking differently: Apply 1 application topically daily as needed (port access).  08/16/20  Yes Magrinat, Virgie Dad, MD  loratadine (CLARITIN) 10 MG tablet Take 10 mg by mouth See admin instructions. Take 10 mg daily for 5 days after chemo treatments   Yes [provider]  LORazepam (ATIVAN) 0.5 MG tablet Take 1 tablet (0.5 mg total) by mouth at bedtime as needed (Nausea or vomiting). 08/16/20  Yes Magrinat, Virgie Dad, MD  Pegfilgrastim (NEULASTA [place]) Inject 1 Dose into the skin See admin instructions. Inject 24 to 72 hours after chemo treatment   Yes [provider]  polyethylene glycol (MIRALAX / GLYCOLAX) 17 g packet Take 17 g by mouth daily.   Yes [provider]  Rivaroxaban (XARELTO) 15 MG TABS tablet Take 1 tablet (15 mg total) by mouth daily. 09/11/20  Yes Hilts, Legrand Como, MD  spironolactone (ALDACTONE) 25 MG tablet Take 25 mg by mouth daily as needed (swelling).   Yes [provider]  traMADol (ULTRAM) 50 MG tablet Take 1-2 tablets (50-100 mg total) by mouth every 6 (six) hours as needed. Patient taking differently: Take 50-100 mg by mouth every 6 (six) hours as needed for moderate pain.  09/08/20  Yes Magrinat, Virgie Dad, MD  tretinoin (RETIN-A) 0.1 % cream Apply 1 application topically at bedtime as needed (acne).    Yes [provider]  UNABLE TO FIND Green Vibrance - 1 scoop daily   Yes [provider]  enoxaparin (LOVENOX) 30 MG/0.3ML injection Inject 0.3 mLs (30 mg total) into the skin daily. Patient not taking: Reported on 09/18/2020  08/16/20   Hilts, Legrand Como, MD  fluconazole (DIFLUCAN) 100 MG tablet Take as directed Patient not taking: Reported on 09/18/2020 09/07/20   Magrinat, Virgie Dad, MD  magic mouthwash SOLN Take 5 mLs by mouth 4 (four) times daily as needed for mouth pain. Patient not taking: Reported on 09/18/2020 09/04/20   Harle Stanford., PA-C  metoCLOPramide (REGLAN) 10 MG tablet Take 1 tablet (10 mg total) by mouth every 6 (six) hours as needed for nausea. 09/13/20   Magrinat, Virgie Dad, MD  oxyCODONE (OXY IR/ROXICODONE) 5 MG immediate release tablet Take 1 tablet (5 mg total) by mouth every 6 (six) hours as needed. Patient not taking: Reported on 09/18/2020 09/13/20   Rolm Bookbinder, MD  prochlorperazine (COMPAZINE) 10 MG tablet Take 0.5-1 tablets (5-10 mg total) by mouth 4 (four) times daily -  before meals and at bedtime. Start the evening of chemotherapy and take for 2 days, then take as needed Patient not taking: Reported on 09/18/2020 08/16/20  Magrinat, Virgie Dad, MD  warfarin (COUMADIN) 5 MG tablet Take 1 tablet (5 mg total) by mouth daily. 06/28/20   Hilts, Legrand Como, MD     Family History  Problem Relation Age of Onset   Rheum arthritis Father    Hemachromatosis Father    Cancer Sister        bone     Social History   Socioeconomic History   Marital status: Widowed    Spouse name: Not on file   Number of children: Not on file   Years of education: Not on file   Highest education level: Not on file  Occupational History   Not on file  Tobacco Use   Smoking status: Former Smoker    Packs/day: 0.50    Years: 5.00    Pack years: 2.50    Types: Cigarettes    Start date: 08/06/2006    Quit date: 08/07/2011    Years since quitting: 9.1   Smokeless tobacco: Never Used  Vaping Use   Vaping Use: Never used  Substance and Sexual Activity   Alcohol use: Yes    Comment: occasionally   Drug use: No   Sexual activity: Yes    Birth control/protection: Pill  Other Topics Concern   Not  on file  Social History Narrative   Not on file   Social Determinants of Health   Financial Resource Strain:    Difficulty of Paying Living Expenses: Not on file  Food Insecurity:    Worried About Charity fundraiser in the Last Year: Not on file   [employer] of Food in the Last Year: Not on file  Transportation Needs:    Lack of Transportation (Medical): Not on file   Lack of Transportation (Non-Medical): Not on file  Physical Activity:    Days of Exercise per Week: Not on file   Minutes of Exercise per Session: Not on file  Stress:    Feeling of Stress : Not on file  Social Connections:    Frequency of Communication with Friends and Family: Not on file   Frequency of Social Gatherings with Friends and Family: Not on file   Attends Religious Services: Not on file   Active Member of Clubs or Organizations: Not on file   Attends Archivist Meetings: Not on file   Marital Status: Not on file    Review of Systems: A 12 point ROS discussed and pertinent positives are indicated in the HPI above.  All other systems are negative.  Review of Systems  Constitutional: Positive for fatigue. Negative for appetite change.  Respiratory: Negative for cough and shortness of breath.   Cardiovascular: Negative for chest pain and leg swelling.  Gastrointestinal: Positive for constipation. Negative for nausea and vomiting.  Neurological: Positive for headaches.    Vital Signs: BP 102/66    Pulse 66    Temp 98.2 F (36.8 C) (Oral)    Resp 16    Ht _0  (1.626 m)    Wt 110 lb (49.9 kg)    LMP 04/20/2020    SpO2 100%    BMI 18.88 kg/m   Physical Exam Constitutional:      General: She is not in acute distress. HENT:     Mouth/Throat:     Mouth: Mucous membranes are moist.     Pharynx: Oropharynx is clear.  Cardiovascular:     Rate and Rhythm: Normal rate and regular rhythm.     Pulses: Normal pulses.  Heart sounds: Normal heart sounds.     Comments: Right upper  chest port-a-catheter; not accessed. Steri-strips in place.  Pulmonary:     Effort: Pulmonary effort is normal.     Breath sounds: Normal breath sounds.  Abdominal:     General: Bowel sounds are normal.     Palpations: Abdomen is soft.  Skin:    General: Skin is warm and dry.  Neurological:     Mental Status: She is alert and oriented to person, place, and time.     Imaging: CT HEAD W & WO CONTRAST  Result Date: 09/14/2020 CLINICAL DATA:  Breast cancer, staging. EXAM: CT HEAD WITHOUT AND WITH CONTRAST TECHNIQUE: Contiguous axial images were obtained from the base of the skull through the vertex without and with intravenous contrast CONTRAST:  33m OMNIPAQUE IOHEXOL 300 MG/ML  SOLN COMPARISON:  None. FINDINGS: Brain: No evidence of acute infarction, hemorrhage, hydrocephalus, extra-axial collection or mass lesion/mass effect. No focus of abnormal contrast enhancement. Vascular: No hyperdense vessel or unexpected calcification. Visible vessels are patent. Skull: Normal. Negative for fracture or focal lesion. Sinuses/Orbits: No acute finding. IMPRESSION: No evidence of metastatic disease in the head. Please note that head CT has low sensitivity for small metastatic lesion. In case of high clinical suspicion, recommend MRI of the brain with contrast. Electronically Signed   By: KPedro EarlsM.D.   On: 09/14/2020 18:49   CT Chest W Contrast  Result Date: 09/14/2020 CLINICAL DATA:  New diagnosis of right breast cancer. Staging. Chemotherapy initiated 08/31/2020. EXAM: CT CHEST WITH CONTRAST TECHNIQUE: Multidetector CT imaging of the chest was performed during intravenous contrast administration. CONTRAST:  728mOMNIPAQUE IOHEXOL 300 MG/ML  SOLN COMPARISON:  None. FINDINGS: Cardiovascular: Normal heart size. No significant pericardial effusion/thickening. Minimally atherosclerotic nonaneurysmal thoracic aorta. Normal caliber pulmonary arteries. No central pulmonary emboli. Right  internal jugular Port-A-Cath terminates in the lower third of the SVC. Subcutaneous emphysema at the port site compatible with recent placement. Mediastinum/Nodes: No discrete thyroid nodules. Unremarkable esophagus. No pathologically enlarged axillary, mediastinal or hilar lymph nodes. Lungs/Pleura: No pneumothorax. No pleural effusion. Coarsely calcified subcentimeter right upper lobe granuloma. Anterior left upper lobe 0.8 cm pulmonary nodule with coarse internal calcifications, also compatible with a granuloma. No acute consolidative airspace disease, lung masses or additional significant pulmonary nodules. Upper abdomen: No acute abnormality. Musculoskeletal:  No aggressive appearing focal osseous lesions. IMPRESSION: 1. No evidence of metastatic disease in the chest. 2. Aortic Atherosclerosis (ICD10-I70.0). Electronically Signed   By: JaIlona Sorrel.D.   On: 09/14/2020 15:12   MR ABDOMEN WWO CONTRAST  Result Date: 08/24/2020 CLINICAL DATA:  Indeterminate liver lesions on recent CT. Right breast carcinoma. EXAM: MRI ABDOMEN WITHOUT AND WITH CONTRAST TECHNIQUE: Multiplanar multisequence MR imaging of the abdomen was performed both before and after the administration of intravenous contrast. CONTRAST:  1067mULTIHANCE GADOBENATE DIMEGLUMINE 529 MG/ML IV SOLN COMPARISON:  CT on 07/14/2020 FINDINGS: Lower chest: No acute findings. Hepatobiliary: Multiple hypovascular masses are now seen in the caudate, right, and left hepatic lobes. The largest in the caudate lobe measures 2.3 x 1.9 cm. The 2nd largest mass is seen in the anterior liver near the junction of the right and left lobes on image 17/17, which measures 1.2 x 1.2 cm. These findings are highly suspicious for liver metastases. A tiny less than 5 mm gallstone is noted, however there is no evidence of cholecystitis or biliary ductal dilatation. Pancreas:  No mass or inflammatory changes. Spleen:  Within  normal limits in size and appearance.  Adrenals/Urinary Tract: No masses identified. No evidence of hydronephrosis. Stomach/Bowel: Visualized portion unremarkable. Vascular/Lymphatic: No pathologically enlarged lymph nodes identified. No abdominal aortic aneurysm. Other:  None. Musculoskeletal:  No suspicious bone lesions identified. IMPRESSION: Multiple hypovascular masses in the caudate, right, and left hepatic lobes, highly suspicious for liver metastases. No other sites of abdominal metastatic disease identified. Tiny gallstone. No radiographic evidence of cholecystitis or biliary ductal dilatation. Electronically Signed   By: Marlaine Hind M.D.   On: 08/24/2020 16:08   NM Bone Scan Whole Body  Result Date: 09/14/2020 CLINICAL DATA:  Breast cancer, new diagnosis EXAM: NUCLEAR MEDICINE WHOLE BODY BONE SCAN TECHNIQUE: Whole body anterior and posterior images were obtained approximately 3 hours after intravenous injection of radiopharmaceutical. RADIOPHARMACEUTICALS:  20.5 mCi Technetium-69mMDP IV COMPARISON:  None Correlation: CT chest 09/14/2020 FINDINGS: Minimal uptake of tracer at the [facility] and sternoclavicular joints, as well as RIGHT knee likely degenerative. Mildly inhomogeneous tracer uptake in the posterior ribs bilaterally, without sclerosis or fractures on CT, may represent artifact. No definite sites of abnormal tracer uptake are seen to suggest osseous metastases. Expected urinary tract and soft tissue distribution of tracer. IMPRESSION: No definite scintigraphic evidence of osseous metastatic disease as above. Electronically Signed   By: MLavonia DanaM.D.   On: 09/14/2020 17:39   DG Abd 2 Views  Result Date: 09/04/2020 CLINICAL DATA:  Constipation, diffuse abdominal pain, breast cancer. EXAM: ABDOMEN - 2 VIEW COMPARISON:  None. FINDINGS: Fair amount of stool is seen in the colon. No small bowel dilatation. No unexpected radiopaque calculi. IMPRESSION: Fair amount of stool in the colon is indicative of constipation.  Electronically Signed   By: MLorin PicketM.D.   On: 09/04/2020 10:53   DG Fluoro Guide CV Line Right  Result Date: 09/13/2020 CLINICAL DATA:  54year old female undergoing placement of a surgical port catheter. EXAM: CHEST FLUOROSCOPY TECHNIQUE: Real-time fluoroscopic evaluation of the chest was performed. FLUOROSCOPY TIME:  Fluoroscopy Time:  0 minutes 20 seconds Radiation Exposure Index (if provided by the fluoroscopic device): 0.88 mGy COMPARISON:  None. FINDINGS: A single saved fluoro images submitted for review. The image demonstrates a right IJ approach single-lumen power injectable port catheter. The catheter tip overlies the superior cavoatrial junction. No evidence of pneumothorax. IMPRESSION: Right IJ approach single-lumen power injectable port catheter. The catheter tip overlies the superior cavoatrial junction. Electronically Signed   By: HJacqulynn CadetM.D.   On: 09/13/2020 16:15   MM CLIP PLACEMENT RIGHT  Result Date: 09/05/2020 CLINICAL DATA:  Status post MRI guided biopsy today of a suspicious mass within the upper-outer quadrant the RIGHT breast. Patient had an earlier ultrasound-guided biopsy confirming malignancy within the RIGHT breast at the 6 o'clock axis. An additional earlier stereotactic biopsy of the posterior RIGHT breast revealed a benign pathology result. EXAM: DIAGNOSTIC RIGHT MAMMOGRAM POST MRI BIOPSY COMPARISON:  Previous exam(s). FINDINGS: Mammographic images were obtained following MRI guided biopsy of the suspicious mass within the upper-outer quadrant of the RIGHT breast. The biopsy marking clip is approximately 5 mm inferomedial to the biopsy site. IMPRESSION: 1. Today's BARBELL shaped clip is adequately positioned approximately 5 mm inferomedial to the biopsy cavity. 2. A ribbon shaped clip corresponds to a biopsy-proven cancer within the RIGHT breast at the 6 o'clock axis. 3. A coil shaped clip within the posterior RIGHT breast corresponds to a previous benign  stereotactic biopsy. Final Assessment: Post Procedure Mammograms for Marker Placement Electronically Signed  By: Franki Cabot M.D.   On: 09/05/2020 09:12   MR RT BREAST BX W LOC DEV 1ST LESION IMAGE BX SPEC MR GUIDE  Addendum Date: 09/07/2020   ADDENDUM REPORT: 09/07/2020 14:20 ADDENDUM: Pathology revealed SCLEROTIC LESION WITH FOCAL LOBULAR NEOPLASIA (ATYPICAL LOBULAR HYPERPLASIA) od the Right breast, upper outer quadrant. The differential diagnosis includes a sclerotic fibroadenoma and sclerotic papilloma. This was found to be concordant by Dr. Franki Cabot, with excision recommended. Pathology results were discussed with the patient by telephone. The patient reported doing well after the biopsy with tenderness at the site. Post biopsy instructions and care were reviewed and questions were answered. The patient was encouraged to call [facility] for any additional concerns. My direct phone number was provided. The patient has a recent diagnosis of Right breast cancer and should follow her outlined treatment plan. Dr. Rolm Bookbinder was notified of biopsy results via EPIC message on September 07, 2020. There are 2 additional suspicious masses within the inner Right breast. If breast conservation is still considered after this biopsy, the patient will need to be scheduled for the 2 additional masses in the inner Right breast. Per my conversation with Dr. Donne Hazel today, additional MRI-guided biopsies will not be requested/ordered at this time. Pathology results reported by Terie Purser, RN on 09/07/2020. Electronically Signed   By: Franki Cabot M.D.   On: 09/07/2020 14:20   Result Date: 09/07/2020 CLINICAL DATA:  Patient with a known biopsy-proven RIGHT breast cancer at the 6 o'clock axis. Subsequent breast MRI revealing 3 additional suspicious masses within the RIGHT breast, 2 of which are located within the inner RIGHT breast and the third mass within the upper-outer  quadrant. This mass within the upper-outer quadrant is farthest from the known cancer so will be biopsied today. EXAM: MRI GUIDED CORE NEEDLE BIOPSY OF THE RIGHT BREAST TECHNIQUE: Multiplanar, multisequence MR imaging of the RIGHT breast was performed both before and after administration of intravenous contrast. CONTRAST:  4m GADAVIST GADOBUTROL 1 MMOL/ML IV SOLN COMPARISON:  Previous exams. FINDINGS: I met with the patient, and we discussed the procedure of MRI guided biopsy, including risks, benefits, and alternatives. Specifically, we discussed the risks of infection, bleeding, tissue injury, clip migration, and inadequate sampling. Informed, written consent was given. The usual time out protocol was performed immediately prior to the procedure. Using sterile technique, 1% Lidocaine, MRI guidance, and a 9 gauge vacuum assisted device, biopsy was performed of the suspicious mass within the upper-outer quadrant of the RIGHT breast using a lateral approach. At the conclusion of the procedure, a barbell shaped tissue marker clip was deployed into the biopsy cavity. Follow-up 2-view mammogram was performed and dictated separately. IMPRESSION: MRI guided biopsy of the suspicious mass within the upper-outer quadrant of the RIGHT breast. No apparent complications. Electronically Signed: By: SFranki CabotM.D. On: 09/05/2020 09:05   UKoreaAbdomen Limited RUQ (LIVER/GB)  Result Date: 09/04/2020 CLINICAL DATA:  History of breast cancer. EXAM: ULTRASOUND ABDOMEN LIMITED RIGHT UPPER QUADRANT COMPARISON:  August 24, 2020.  July 14, 2020. FINDINGS: Gallbladder: Probable 6 mm calculus is noted. No gallbladder wall thickening or pericholecystic fluid is noted. No sonographic Murphy's sign is noted. Common bile duct: Diameter: 5 mm which is within normal limits. Liver: 2.6 cm solid lesion is seen in the region of the caudate lobe which corresponds to metastatic lesion seen on prior MRI. The other metastatic lesions noted  on prior MRI are not well visualized on this study.  Within normal limits in parenchymal echogenicity. Portal vein is patent on color Doppler imaging with normal direction of blood flow towards the liver. Other: None. IMPRESSION: 2.6 cm solid lesion seen in the caudate lobe of the liver consistent with metastatic disease as described on prior MRI. Cholelithiasis is noted without evidence of cholecystitis. Electronically Signed   By: Marijo Conception M.D.   On: 09/04/2020 09:20    Labs:  CBC: Recent Labs    07/28/20 1108 08/31/20 0927 09/04/20 0919 09/08/20 1141  WBC 5.5 8.5 16.8* 36.9*  HGB 14.3 13.2 13.7 12.6  HCT 41.4 39.5 41.6 37.9  PLT 202 169 135* 134*    COAGS: Recent Labs    08/08/20 1400 08/24/20 1125 09/08/20 1141 09/12/20 1200  INR 1.6* 2.4* 1.1 1.0    BMP: Recent Labs    10/20/19 1054 10/20/19 1054 03/17/20 1150 03/17/20 1150 07/28/20 1108 08/31/20 0927 09/04/20 0919 09/08/20 1141  NA 138   < > 140   < > 137 138 138 136  K 4.3   < > 4.0   < > 4.4 4.0 4.1 3.8  CL 101   < > 105   < > 98 106 105 100  CO2 25   < > 23   < > _0 GLUCOSE 87   < > 89   < > 57* 87 98 104*  BUN 12   < > 8   < > _1 CALCIUM 9.2   < > 8.7   < > 9.5 8.9 8.9 9.1  CREATININE 0.85   < > 0.84   < > 0.82 0.73 0.72 0.78  GFRNONAA 80   < > 80   < > 83 >60 >60 >60  GFRAA 92  --  92  --  95  --   --   --    < > = values in this interval not displayed.    LIVER FUNCTION TESTS: Recent Labs    07/28/20 1108 08/31/20 0927 09/04/20 0919 09/08/20 1141  BILITOT 0.4 0.6 0.9 0.3  AST 21 19 46* 36  ALT 18 16 52* 48*  ALKPHOS 78 61 53 115  PROT 7.3 6.4* 6.2* 6.2*  ALBUMIN 4.9 3.7 3.7 3.4*    TUMOR MARKERS: No results for input(s): AFPTM, CEA, CA199, CHROMGRNA in the last 8760 hours.  Assessment and Plan:  Right Breast Cancer with suspected liver metastases: Sharica L. 3, 54 year old female, presents today to the [facility] department for an  image-guided liver lesion biopsy.   Risks and benefits of this procedure were discussed with the patient and/or patient's family including, but not limited to bleeding, infection, damage to adjacent structures or low yield requiring additional tests.  All of the questions were answered and there is agreement to proceed.  The patient has been NPO. Labs and vitals have been reviewed. She has held her blood thinner for the appropriate length of time.   The patient has been NPO. Vitals have been reviewed. Labs are pending but will be reviewed prior to the start of the procedure.   Consent signed and in chart.  Thank you for this interesting consult.  I greatly enjoyed meeting Corrina Steffensen [place] and look forward to participating in their care.  A copy of this report was sent to the requesting provider on this date.  Electronically Signed: Soyla Dryer, AGACNP-BC [phone] 09/20/2020, 7:25 AM   I spent a total of  30  Minutes   in face to face in clinical consultation, greater than 50% of which was counseling/coordinating care for liver lesion biopsy.

## 2020-09-20 ENCOUNTER — Encounter (HOSPITAL_COMMUNITY): Payer: Self-pay

## 2020-09-20 ENCOUNTER — Other Ambulatory Visit: Payer: Self-pay

## 2020-09-20 ENCOUNTER — Ambulatory Visit (HOSPITAL_COMMUNITY)
Admission: RE | Admit: 2020-09-20 | Discharge: 2020-09-20 | Disposition: A | Discharge status: Home / Self Care | Payer: 59 | Source: Ambulatory Visit | Attending: Oncology | Admitting: Oncology

## 2020-09-20 ENCOUNTER — Other Ambulatory Visit (HOSPITAL_COMMUNITY): Payer: Self-pay

## 2020-09-20 DIAGNOSIS — C787 Secondary malignant neoplasm of liver and intrahepatic bile duct: Secondary | ICD-10-CM | POA: Insufficient documentation

## 2020-09-20 DIAGNOSIS — Z170 Estrogen receptor positive status [ER+]: Secondary | ICD-10-CM | POA: Diagnosis not present

## 2020-09-20 DIAGNOSIS — Z79899 Other long term (current) drug therapy: Secondary | ICD-10-CM | POA: Insufficient documentation

## 2020-09-20 DIAGNOSIS — Z853 Personal history of malignant neoplasm of breast: Secondary | ICD-10-CM | POA: Diagnosis not present

## 2020-09-20 DIAGNOSIS — M329 Systemic lupus erythematosus, unspecified: Secondary | ICD-10-CM | POA: Diagnosis not present

## 2020-09-20 DIAGNOSIS — I7300 Raynaud's syndrome without gangrene: Secondary | ICD-10-CM | POA: Diagnosis not present

## 2020-09-20 DIAGNOSIS — Z87891 Personal history of nicotine dependence: Secondary | ICD-10-CM | POA: Insufficient documentation

## 2020-09-20 DIAGNOSIS — C50511 Malignant neoplasm of lower-outer quadrant of right female breast: Secondary | ICD-10-CM

## 2020-09-20 DIAGNOSIS — R160 Hepatomegaly, not elsewhere classified: Secondary | ICD-10-CM | POA: Insufficient documentation

## 2020-09-20 LAB — CBC
HCT: 38.3 % (ref 36.0–46.0)
Hemoglobin: 12.1 g/dL (ref 12.0–15.0)
MCH: 30.3 pg (ref 26.0–34.0)
MCHC: 31.6 g/dL (ref 30.0–36.0)
MCV: 96 fL (ref 80.0–100.0)
Platelets: 339 10*3/uL (ref 150–400)
RBC: 3.99 MIL/uL (ref 3.87–5.11)
RDW: 13.1 % (ref 11.5–15.5)
WBC: 7.1 10*3/uL (ref 4.0–10.5)
nRBC: 0 % (ref 0.0–0.2)

## 2020-09-20 LAB — PROTIME-INR
INR: 1.1 (ref 0.8–1.2)
Prothrombin Time: 13.8 seconds (ref 11.4–15.2)

## 2020-09-20 IMAGING — US US BIOPSY CORE LIVER
1 series · 9 of 9 positions shown · non-contrast
Comparison: none

INDICATION: History of right breast carcinoma with probable metastatic lesions
in the liver including a 2.5 cm mass in the caudate lobe of the
liver.

[Series 1: us biopsy (liver) · 9 acquisitions, 9 frames shown]
[im 1/9]
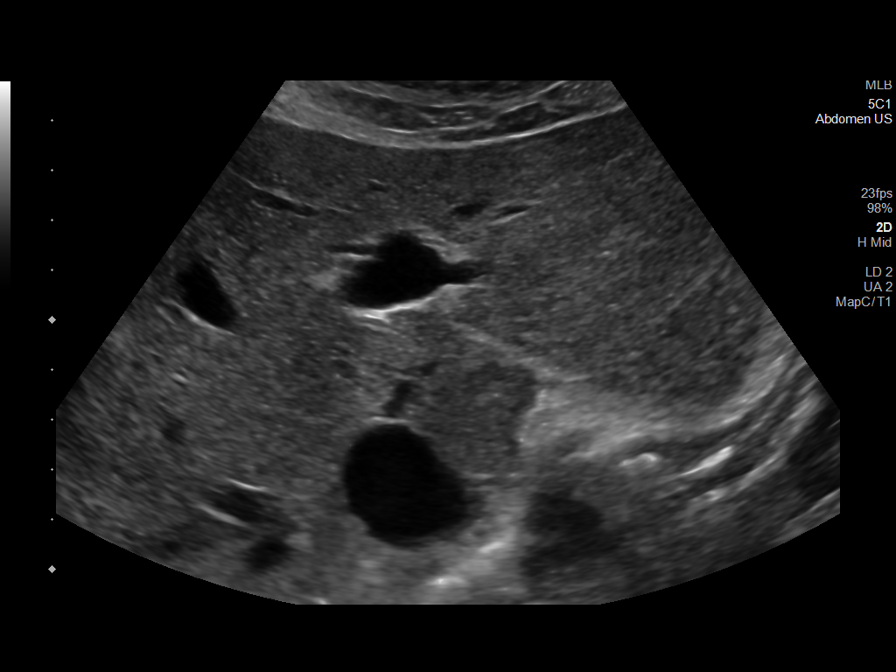
[im 2/9]
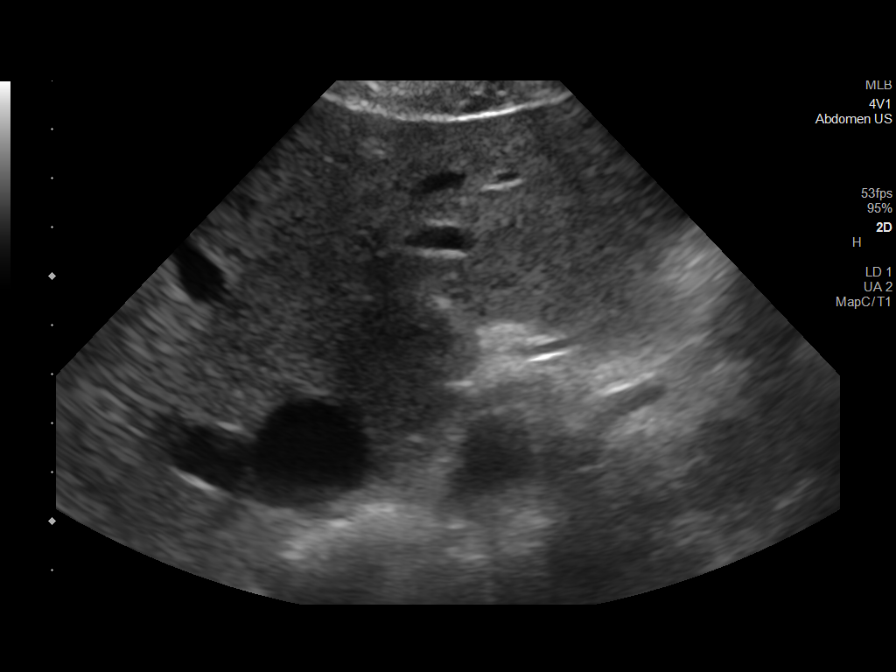
[im 3/9]
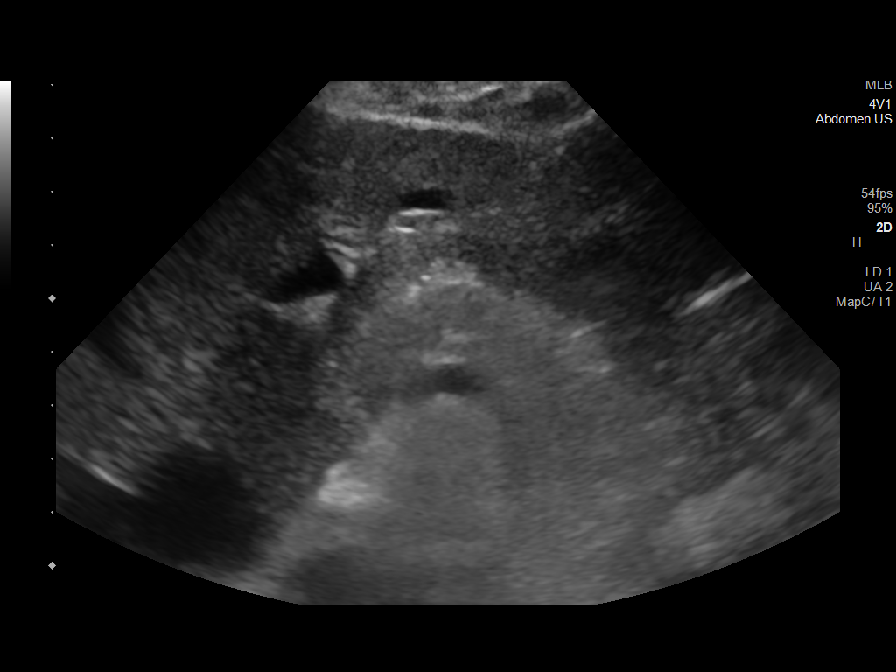
[im 4/9]
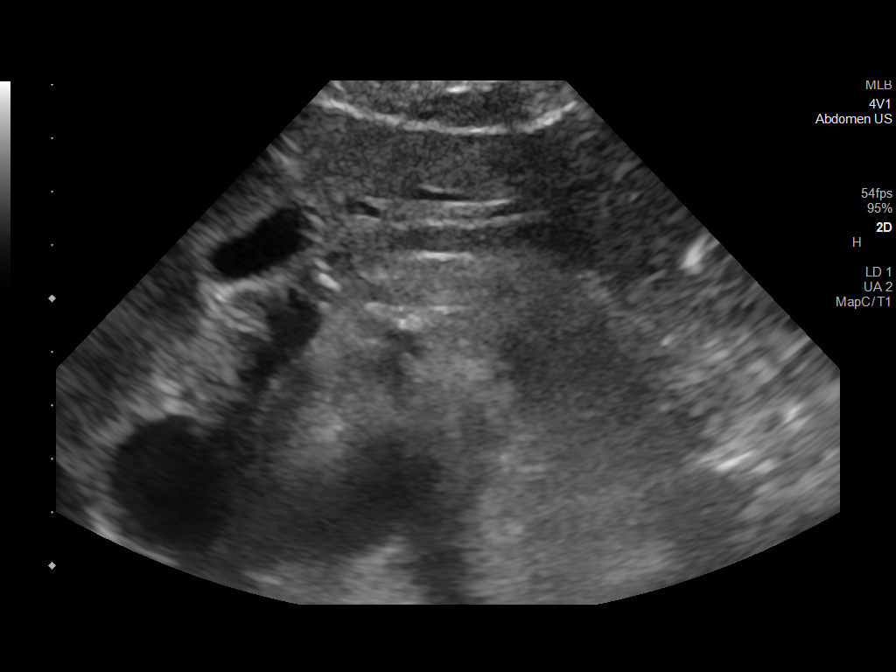
[im 5/9]
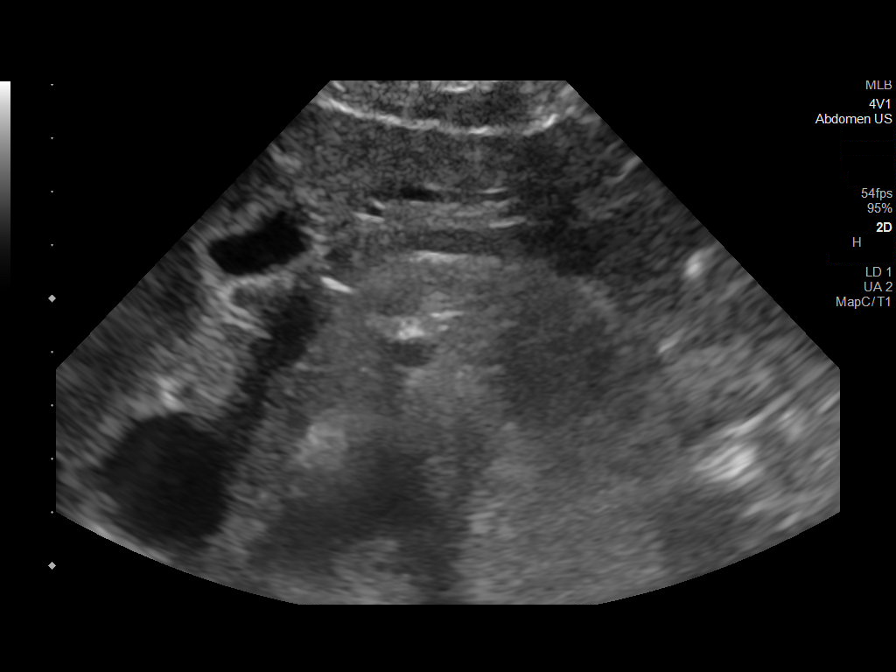
[im 6/9]
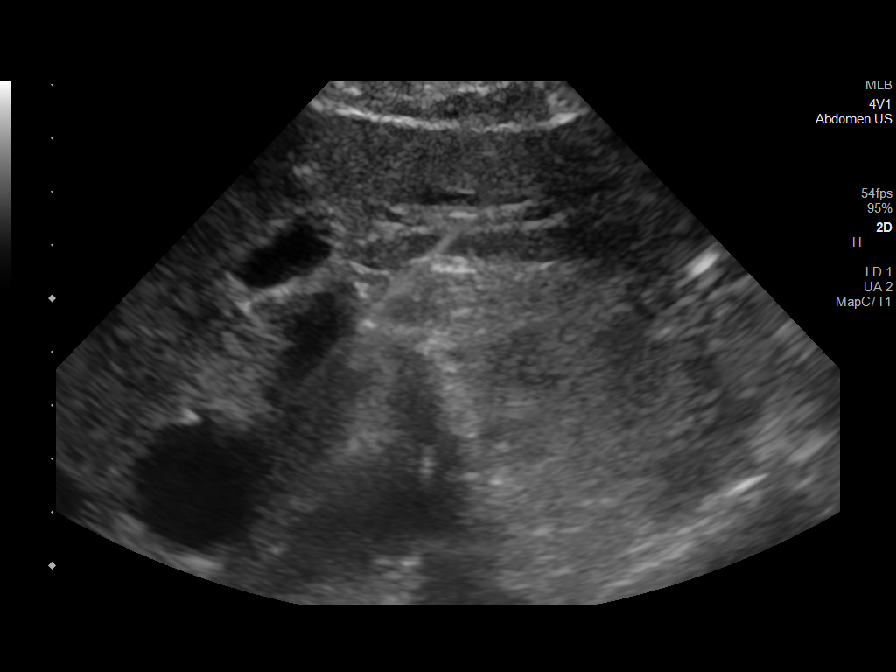
[im 7/9]
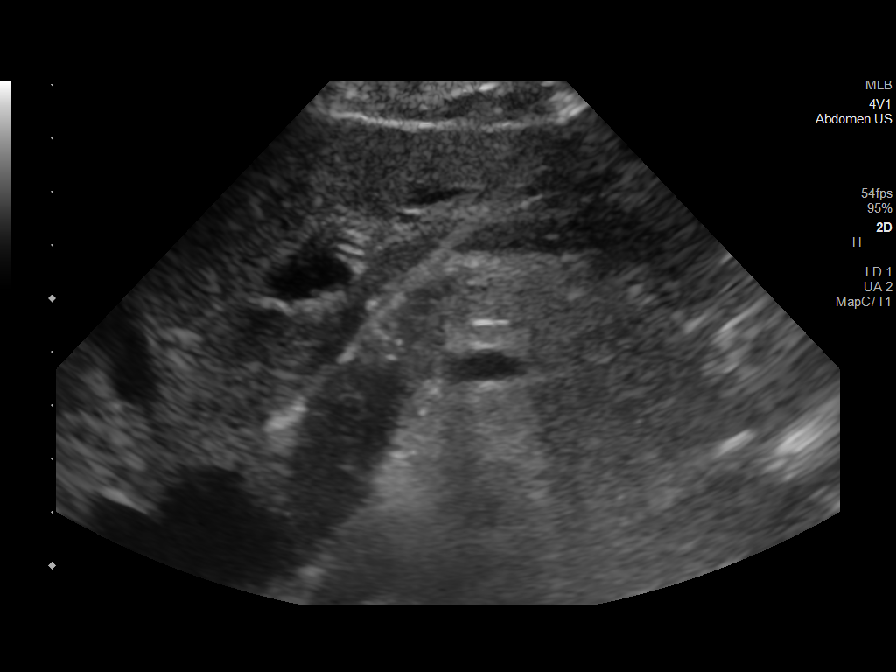
[im 8/9]
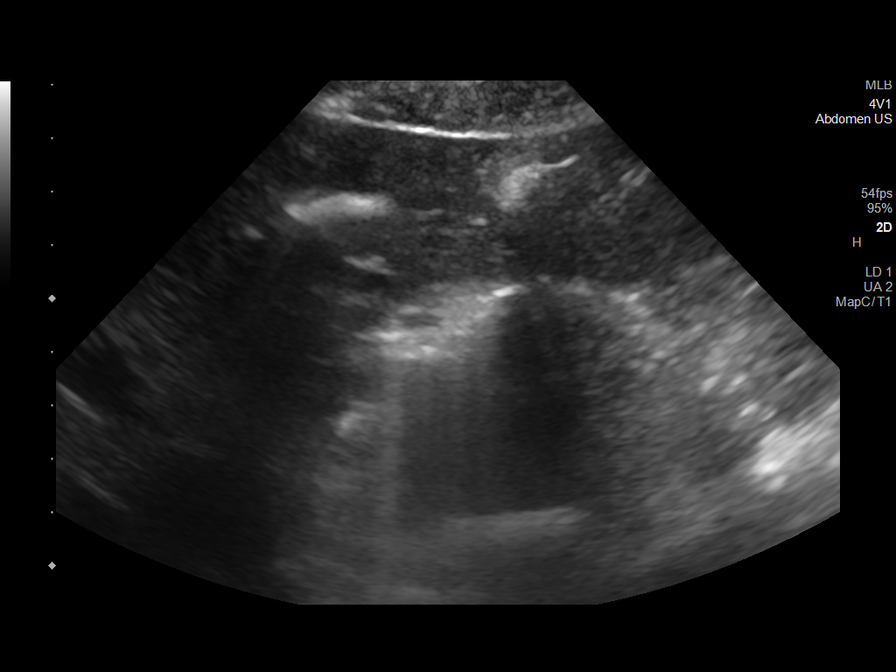
[im 9/9]
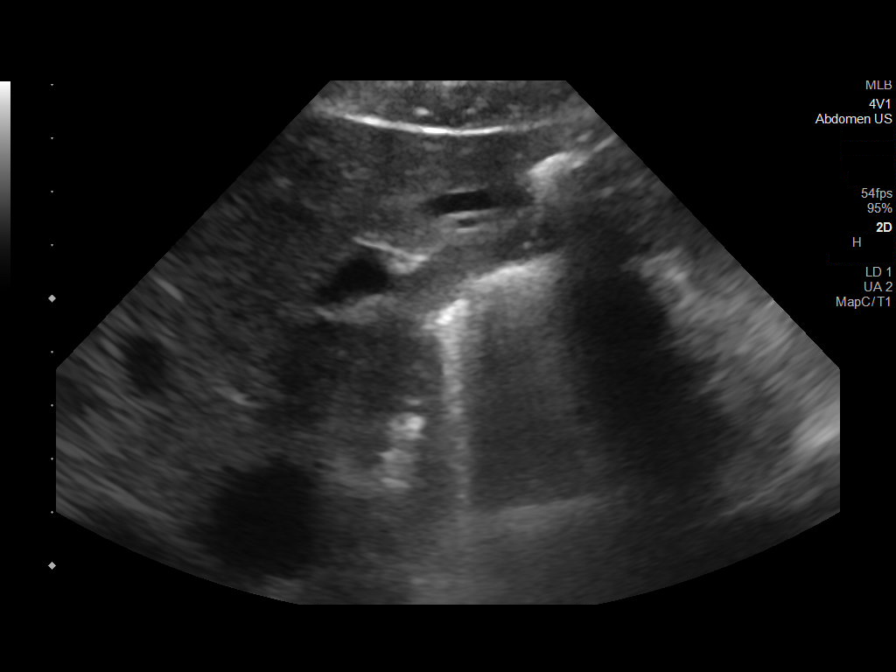

[9 of 9 positions shown; findings below may reference images not displayed]

EXAM:
ULTRASOUND GUIDED CORE BIOPSY OF LIVER MASS

MEDICATIONS:
None.

ANESTHESIA/SEDATION:
Fentanyl 75 mcg IV; Versed 1.5 mg IV

Moderate Sedation Time:  31 minutes.

The patient was continuously monitored during the procedure by the
interventional radiology nurse under my direct supervision.

PROCEDURE:
The procedure, risks, benefits, and alternatives were explained to
the patient. Questions regarding the procedure were encouraged and
answered. The patient understands and consents to the procedure. A
time-out was performed prior to initiating the procedure.

Ultrasound was performed of the liver with various probes to
localize lesions. The abdominal wall was prepped with chlorhexidine
in a sterile fashion, and a sterile drape was applied covering the
operative field. A sterile gown and sterile gloves were used for the
procedure. Local anesthesia was provided with 1% Lidocaine.

Under ultrasound guidance, a 17 gauge trocar needle was advanced to
the level of a lesion within the caudate lobe of the liver near its
junction with the right lobe and just anterior to the IVC. After
confirming needle tip position, 3 separate coaxial 18 gauge core
biopsy samples were obtained and submitted in formalin. A slurry of
Gel-Foam pledgets was injected via the 17 gauge needle as the needle
was retracted on completion.

COMPLICATIONS:
None immediate.
FINDINGS: The only lesion visible in the liver by ultrasound is the roughly
2.5 cm caudate mass which is vaguely marginated but localized by
ultrasound in the exact location seen by prior ultrasound and by
MRI. Solid tissue samples were obtained.
IMPRESSION: Ultrasound-guided core biopsy performed at the level of a 2.5 cm
mass in the caudate lobe of the liver.

## 2020-09-20 MED ORDER — GELATIN ABSORBABLE 12-7 MM EX MISC
CUTANEOUS | Status: AC
  Filled 2020-09-20: qty 1

## 2020-09-20 MED ORDER — ONDANSETRON HCL 4 MG/2ML IJ SOLN
INTRAMUSCULAR | Status: AC
  Administered 2020-09-20: 4 mg
  Filled 2020-09-20: qty 2

## 2020-09-20 MED ORDER — LIDOCAINE HCL (PF) 1 % IJ SOLN
INTRAMUSCULAR | Status: AC
  Filled 2020-09-20: qty 30

## 2020-09-20 MED ORDER — MIDAZOLAM HCL 2 MG/2ML IJ SOLN
INTRAMUSCULAR | Status: AC | PRN
  Administered 2020-09-20: 1 mg via INTRAVENOUS
  Administered 2020-09-20: 0.5 mg via INTRAVENOUS

## 2020-09-20 MED ORDER — HYDROCODONE-ACETAMINOPHEN 5-325 MG PO TABS
ORAL_TABLET | ORAL | Status: AC
  Filled 2020-09-20: qty 1

## 2020-09-20 MED ORDER — FENTANYL CITRATE (PF) 100 MCG/2ML IJ SOLN
INTRAMUSCULAR | Status: AC
  Filled 2020-09-20: qty 4

## 2020-09-20 MED ORDER — FENTANYL CITRATE (PF) 100 MCG/2ML IJ SOLN
INTRAMUSCULAR | Status: AC | PRN
Start: 2020-09-20 — End: 2020-09-20
  Administered 2020-09-20 (×3): 25 ug via INTRAVENOUS

## 2020-09-20 MED ORDER — ONDANSETRON HCL 4 MG/2ML IJ SOLN: 4.0000 mg | Freq: Once | INTRAMUSCULAR | Status: DC

## 2020-09-20 MED ORDER — HYDROMORPHONE HCL 1 MG/ML IJ SOLN
INTRAMUSCULAR | Status: AC
  Filled 2020-09-20: qty 1

## 2020-09-20 MED ORDER — HYDROMORPHONE HCL 1 MG/ML IJ SOLN: 0.5000 mg | INTRAMUSCULAR | Status: DC | PRN

## 2020-09-20 MED ORDER — SODIUM CHLORIDE 0.9 % IV SOLN: INTRAVENOUS | Status: DC

## 2020-09-20 MED ORDER — HYDROCODONE-ACETAMINOPHEN 5-325 MG PO TABS
1.0000 | ORAL_TABLET | ORAL | Status: DC | PRN
  Administered 2020-09-20: 1 via ORAL

## 2020-09-20 MED ORDER — MIDAZOLAM HCL 2 MG/2ML IJ SOLN
INTRAMUSCULAR | Status: AC
  Filled 2020-09-20: qty 4

## 2020-09-20 MED ORDER — HYDROMORPHONE HCL 1 MG/ML IJ SOLN
INTRAMUSCULAR | Status: AC | PRN
Start: 2020-09-20 — End: 2020-09-20
  Administered 2020-09-20: 0.5 mg via INTRAVENOUS

## 2020-09-20 NOTE — Discharge Instructions (Addendum)
Liver Biopsy, Care After These instructions give you information on caring for yourself after your procedure. Your doctor may also give you more specific instructions. Call your doctor if you have any problems or questions after your procedure. What can I expect after the procedure? After the procedure, it is common to have:  Pain and soreness where the biopsy was done.  Bruising around the area where the biopsy was done.  Sleepiness and be tired for a few days. Follow these instructions at home: Medicines  Take over-the-counter and prescription medicines only as told by your doctor.  If you were prescribed an antibiotic medicine, take it as told by your doctor. Do not stop taking the antibiotic even if you start to feel better.  Do not take medicines such as aspirin and ibuprofen. These medicines can thin your blood. Do not take these medicines unless your doctor tells you to take them.  If you are taking prescription pain medicine, take actions to prevent or treat constipation. Your doctor may recommend that you: ? Drink enough fluid to keep your pee (urine) clear or pale yellow. ? Take over-the-counter or prescription medicines. ? Eat foods that are high in fiber, such as fresh fruits and vegetables, whole grains, and beans. ? Limit foods that are high in fat and processed sugars, such as fried and sweet foods. Caring for your cut  Follow instructions from your doctor about how to take care of your cuts from surgery (incisions). Make sure you: ? Wash your hands with soap and water before you change your bandage (dressing). If you cannot use soap and water, use hand sanitizer. ? Change your bandage as told by your doctor. ? Leave stitches (sutures), skin glue, or skin tape (adhesive) strips in place. They may need to stay in place for 2 weeks or longer. If tape strips get loose and curl up, you may trim the loose edges. Do not remove tape strips completely unless your doctor says it is  okay.  Check your cuts every day for signs of infection. Check for: ? Redness, swelling, or more pain. ? Fluid or blood. ? Pus or a bad smell. ? Warmth.  Do not take baths, swim, or use a hot tub until your doctor says it is okay to do so. Activity   Rest at home for 1-2 days or as told by your doctor. ? Avoid sitting for a long time without moving. Get up to take short walks every 1-2 hours.  Return to your normal activities as told by your doctor. Ask what activities are safe for you.  Do not do these things in the first 24 hours: ? Drive. ? Use machinery. ? Take a bath or shower.  Do not lift more than 10 pounds (4.5 kg) or play contact sports for the first 2 weeks. General instructions   Do not drink alcohol in the first week after the procedure.  Have someone stay with you for at least 24 hours after the procedure.  Get your test results. Ask your doctor or the department that is doing the test: ? When will my results be ready? ? How will I get my results? ? What are my treatment options? ? What other tests do I need? ? What are my next steps?  Keep all follow-up visits as told by your doctor. This is important. Contact a doctor if:  A cut bleeds and leaves more than just a small spot of blood.  A cut is red, puffs up (  swells), or hurts more than before.  Fluid or something else comes from a cut.  A cut smells bad.  You have a fever or chills. Get help right away if:  You have swelling, bloating, or pain in your belly (abdomen).  You get dizzy or faint.  You have a rash.  You feel sick to your stomach (nauseous) or throw up (vomit).  You have trouble breathing, feel short of breath, or feel faint.  Your chest hurts.  You have problems talking or seeing.  You have trouble with your balance or moving your arms or legs. Summary  After the procedure, it is common to have pain, soreness, bruising, and tiredness.  Your doctor will tell you how to  take care of yourself at home. Change your bandage, take your medicines, and limit your activities as told by your doctor.  Call your doctor if you have symptoms of infection. Get help right away if your belly swells, your cut bleeds a lot, or you have trouble talking or breathing. This information is not intended to replace advice given to you by your health care provider. Make sure you discuss any questions you have with your health care provider. Document Revised: 11/07/2017 Document Reviewed: 11/07/2017 Elsevier Patient Education  2020 [employer] Moderate Conscious Sedation, Adult Sedation is the use of medicines to promote relaxation and relieve discomfort and anxiety. Moderate conscious sedation is a type of sedation. Under moderate conscious sedation, you are less alert than normal, but you are still able to respond to instructions, touch, or both. Moderate conscious sedation is used during short medical and dental procedures. It is milder than deep sedation, which is a type of sedation under which you cannot be easily woken up. It is also milder than general anesthesia, which is the use of medicines to make you unconscious. Moderate conscious sedation allows you to return to your regular activities sooner. Tell a health care provider about:  Any allergies you have.  All medicines you are taking, including vitamins, herbs, eye drops, creams, and over-the-counter medicines.  Use of steroids (by mouth or creams).  Any problems you or family members have had with sedatives and anesthetic medicines.  Any blood disorders you have.  Any surgeries you have had.  Any medical conditions you have, such as sleep apnea.  Whether you are pregnant or may be pregnant.  Any use of cigarettes, alcohol, marijuana, or street drugs. What are the risks? Generally, this is a safe procedure. However, problems may occur, including:  Getting too much medicine (oversedation).  Nausea.  Allergic  reaction to medicines.  Trouble breathing. If this happens, a breathing tube may be used to help with breathing. It will be removed when you are awake and breathing on your own.  Heart trouble.  Lung trouble. What happens before the procedure? Staying hydrated Follow instructions from your health care provider about hydration, which may include:  Up to 2 hours before the procedure - you may continue to drink clear liquids, such as water, clear fruit juice, black coffee, and plain tea. Eating and drinking restrictions Follow instructions from your health care provider about eating and drinking, which may include:  8 hours before the procedure - stop eating heavy meals or foods such as meat, fried foods, or fatty foods.  6 hours before the procedure - stop eating light meals or foods, such as toast or cereal.  6 hours before the procedure - stop drinking milk or drinks that contain milk.  2   hours before the procedure - stop drinking clear liquids. Medicine Ask your health care provider about:  Changing or stopping your regular medicines. This is especially important if you are taking diabetes medicines or blood thinners.  Taking medicines such as aspirin and ibuprofen. These medicines can thin your blood. Do not take these medicines before your procedure if your health care provider instructs you not to.  Tests and exams  You will have a physical exam.  You may have blood tests done to show: ? How well your kidneys and liver are working. ? How well your blood can clot. General instructions  Plan to have someone take you home from the hospital or clinic.  If you will be going home right after the procedure, plan to have someone with you for 24 hours. What happens during the procedure?  An IV tube will be inserted into one of your veins.  Medicine to help you relax (sedative) will be given through the IV tube.  The medical or dental procedure will be performed. What  happens after the procedure?  Your blood pressure, heart rate, breathing rate, and blood oxygen level will be monitored often until the medicines you were given have worn off.  Do not drive for 24 hours. This information is not intended to replace advice given to you by your health care provider. Make sure you discuss any questions you have with your health care provider. Document Revised: 10/10/2017 Document Reviewed: 02/17/2016 Elsevier Patient Education  2020 [employer]  

## 2020-09-20 NOTE — Progress Notes (Signed)
Patient assessed in short stay prior to discharge. She is alert and oriented with stable vital signs. She is complaining of upper abdominal discomfort that is within an expected range for the procedure that was done. The patient is concerned about pain and nausea for the drive home. Patient can have Norco and Zofran and a request was made to the nurse to administer these drugs. The patient was advised to rest when she gets home and take ibuprofen or tylenol for pain.   Patient is stable for discharge.   Please call IR with any questions.  Soyla Dryer, [place] [phone] 09/20/2020, 11:55 AM

## 2020-09-20 NOTE — Discharge Instructions (Signed)

## 2020-09-20 NOTE — Procedures (Signed)
Interventional Radiology Procedure Note  Procedure: US Guided Biopsy of Liver Lesion  Complications: None  Estimated Blood Loss: < 10 mL  Findings: 18 G core biopsy of right lobe/caudate liver lesion performed under US guidance.  Three core samples obtained and sent to Pathology.  Venetia Night. Kathlene Cote, M.D Pager:  [phone]

## 2020-09-20 NOTE — Progress Notes (Signed)
[place]  Telephone:([phone]) [phone] Fax:(336) [phone]     ID: Erica Wood DOB: 12-04-1965  MR#: [id number]  MWU#:[id number]  Patient Care Team: Eunice Blase, MD as PCP - General (Family Medicine) Prudence Davidson, [place] Bing, MD as Referring Physician (Oncology) Rockwell Germany, RN as Oncology Nurse Navigator Mauro Kaufmann, RN as Oncology Nurse Navigator Rolm Bookbinder, MD as Consulting Physician (General Surgery) Magrinat, Virgie Dad, MD as Consulting Physician (Oncology) Kyung Rudd, MD as Consulting Physician (Radiation Oncology) Bo Merino, MD as Consulting Physician (Rheumatology) Olga Millers, MD as Consulting Physician (Obstetrics and Gynecology) Chauncey Cruel, MD OTHER MD:  CHIEF COMPLAINT: triple positive breast cancer  CURRENT TREATMENT: Neoadjuvant therapy    INTERVAL HISTORY: Erica Wood returns today for follow up of her triple positive breast cancer.  She is accompanied by her longtime significant other fill.  She began neoadjuvant chemotherapy, consisting of docetaxel, carboplatin, trastuzumab, and pertuzumab every 21 days x6, on 08/31/2020.  She did well for the first 4 or 5 days but then things really got rough.  She developed a severe sore throat.  She had bony pains.  She is having scalp pain meaning that she is likely to lose her hair very soon.  She has felt very weak.  All of this was complicated by some confusion or miscommunication regarding her scheduled tests.  This is discussed further below.  Since her last visit, she underwent port placement on 09/13/2020.  She also underwent staging studies, consisting of bone scan and CT head and chest on 09/14/2020. Chest CT was negative for metastatic disease.  Head CT was also negative for metastatic disease, but a note stated caution as head CT has low sensitivity for small metastatic lesions.  Finally, bone scan was also negative for metastatic disease.  She also underwent liver biopsy  yesterday, 09/20/2020.  Results are pending   REVIEW OF SYSTEMS: Andre unfortunately did not completely go to sleep with her liver biopsy and had quite a bit of pain.  She was given some Dilaudid and that helped.  By now the pain is better.  She continues constipated and this was discussed extensively today.  They met with Dr. Donne Hazel and she appears to have a little bit of an allergy to tape something for [place] to keep in mind in the future.  She developed some acne from the chemo earlier.  Her disability papers apparently were not filled exactly correctly and we are fixing that today otherwise a detailed review of systems today was stable   COVID 19 VACCINATION STATUS:    HISTORY OF CURRENT ILLNESS: From the original intake note:  Erica Wood herself palpated an abnormality in the right breast several years ago.  This had not changed until sometime in June 2021.  She was evaluated by Dr. Ouida Sills and underwent right diagnostic mammography with tomography and right breast ultrasonography at [facility] on 07/26/2020 showing: breast density category D; 2.6 cm mass in right breast at 6 o'clock, at site of palpable concern; two focal groups of punctate calcifications posterior to palpable mass; no right axillary adenopathy.  Accordingly on 08/04/2020 she proceeded to biopsy of the right breast area in question. The pathology from this procedure ([id number]) showed: invasive ductal carcinoma, grade 3. Prognostic indicators significant for: estrogen receptor, 95% positive with moderate staining intensity and progesterone receptor, 10% positive with strong staining intensity. Proliferation marker Ki67 at 40%. HER2 equivocal by immunohistochemistry (2+), but positive by fluorescent in situ hybridization with a signals ratio  3.23 and number per cell 6.45.  Biopsy of the posterior calcifications was benign.  She also underwent breast MRI on 08/11/2020 showing: breast composition D; 2.6 cm biopsy-proven  malignancy in right breast at 6 o'clock, with probable skin involvement noted on previous ultrasound; additional enhancing masses within right breast-- 5 mm in lower-outer, 1.2 cm in upper-inner, 7 mm in upper-outer; no evidence of left breast malignancy or lymphadenopathy; heterogeneous enhancement of sternum, of uncertain significance.  The patient's subsequent history is as detailed below.   PAST MEDICAL HISTORY: Past Medical History:  Diagnosis Date  . Anti-cardiolipin antibody positive   . Anti-cardiolipin antibody syndrome (HCC)   . Anxiety   . Arthralgia   . Arthritis    bil knees, hands  . Atypical chest pain   . Autoimmune disease ([place])   . Cancer ([place]) 07/2020   right breast IDC  . Chronic fatigue   . Depression   . DVT (deep venous thrombosis) ([place])   . IBS (irritable bowel syndrome)    with constipation  . Lupus ([place])   . Pulmonary embolus ([place])   . Raynaud's syndrome   . Sicca ([place])   . Thrombocytopenia ([place])     PAST SURGICAL HISTORY: Past Surgical History:  Procedure Laterality Date  . ABLATION  12/2018   leg veins  . ABLATION Right 08/2019   venous leg   . APPENDECTOMY    . COLON SURGERY     Perforated colon repair after colostomy  . GANGLION CYST EXCISION    . HYSTEROSCOPY WITH D & C N/A 01/13/2014   Procedure: DILATATION AND CURETTAGE /HYSTEROSCOPY with resection ;  Surgeon: Olga Millers, MD;  Location: [facility];  Service: Gynecology;  Laterality: N/A;  . LAPAROTOMY    . PORTACATH PLACEMENT N/A 09/13/2020   Procedure: INSERTION PORT-A-CATH WITH ULTRASOUND GUIDANCE;  Surgeon: Rolm Bookbinder, MD;  Location: [facility];  Service: General;  Laterality: N/A;    FAMILY HISTORY: Family History  Problem Relation Age of Onset  . Rheum arthritis Father   . Hemachromatosis Father   . Cancer Sister        bone   as of October 2021 her father isage 71 and her mother age 43.. The patient had one sister (and no brothers). The sister was  diagnosed with Ewing's sarcoma at age 73, and died from the disease after many difficult treatments.    GYNECOLOGIC HISTORY:  No LMP recorded. (Menstrual status: Irregular Periods).  Menarche: 45 or 54 years old GX P 0 LMP around 2019 Contraceptive: yes HRT no  Hysterectomy? no BSO? 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 She is widowed. She lives at home alone with her cat, Mariea Clonts. She is not a Ambulance person.    ADVANCED DIRECTIVES: not in place; at the 08/16/2020 visit the patient was given the appropriate documents to complete and notarized at her discretion.   HEALTH MAINTENANCE: Social History   Tobacco Use  . Smoking status: Former Smoker    Packs/day: 0.50    Years: 5.00    Pack years: 2.50    Types: Cigarettes    Start date: 08/06/2006    Quit date: 08/07/2011    Years since quitting: 9.1  . Smokeless tobacco: Never Used  Vaping Use  . Vaping Use: Never used  Substance Use Topics  . Alcohol use: Yes    Comment: occasionally  . Drug use: No     Colonoscopy:  2002 for perforated bowel  PAP: 03/2020  Bone density: done at Dr. [facility] office   Allergies  Allergen Reactions  . Beef-Derived Products Hives  . Latex Rash    Contact  . Sulfa Antibiotics Rash    Current Outpatient Medications  Medication Sig Dispense Refill  . acetaminophen (TYLENOL) 500 MG tablet Take 1,000 mg by mouth every 6 (six) hours as needed for moderate pain or headache.    . amphetamine-dextroamphetamine (ADDERALL XR) 20 MG 24 hr capsule Take 1 capsule (20 mg total) by mouth daily. 30 capsule 0  . Ascorbic Acid (VITAMIN C PO) Take 3,000 mg by mouth daily.     Marland Kitchen CAMILA 0.35 MG tablet Take 1 tablet by mouth daily.  11  . Cholecalciferol (VITAMIN D3) 75 MCG (3000 UT) TABS Take 3,000 Units by mouth daily.    Marland Kitchen dexamethasone (DECADRON) 4 MG tablet Take 2 tablets (8 mg total) by mouth 2 (two) times daily. Start the day before Taxotere.  Then take daily x 3 days after chemotherapy. (Patient taking differently: Take 8 mg by mouth See admin instructions. Take 8 mg by mouth twice daily. Start the day before Taxotere. Then take daily x 3 days after chemotherapy.) 30 tablet 1  . Docusate Sodium (COLACE PO) Take 2 tablets by mouth 2 (two) times daily.    Marland Kitchen enoxaparin (LOVENOX) 30 MG/0.3ML injection Inject 0.3 mLs (30 mg total) into the skin daily. (Patient not taking: Reported on 09/18/2020) 6 mL 1  . fluconazole (DIFLUCAN) 100 MG tablet Take as directed (Patient not taking: Reported on 09/18/2020) 30 tablet 0  . lidocaine-prilocaine (EMLA) cream Apply to affected area once (Patient taking differently: Apply 1 application topically daily as needed (port access). ) 30 g 3  . loratadine (CLARITIN) 10 MG tablet Take 10 mg by mouth See admin instructions. Take 10 mg daily for 5 days after chemo treatments    . LORazepam (ATIVAN) 0.5 MG tablet Take 1 tablet (0.5 mg total) by mouth at bedtime as needed (Nausea or vomiting). 30 tablet 0  . magic mouthwash SOLN Take 5 mLs by mouth 4 (four) times daily as needed for mouth pain. (Patient not taking: Reported on 09/18/2020) 240 mL 0  . metoCLOPramide (REGLAN) 10 MG tablet Take 1 tablet (10 mg total) by mouth every 6 (six) hours as needed for nausea. 40 tablet 2  . oxyCODONE (OXY IR/ROXICODONE) 5 MG immediate release tablet Take 1 tablet (5 mg total) by mouth every 6 (six) hours as needed. (Patient not taking: Reported on 09/18/2020) 6 tablet 0  . Pegfilgrastim (NEULASTA [place]) Inject 1 Dose into the skin See admin instructions. Inject 24 to 72 hours after chemo treatment    . polyethylene glycol (MIRALAX / GLYCOLAX) 17 g packet Take 17 g by mouth daily.    . prochlorperazine (COMPAZINE) 10 MG tablet Take 0.5-1 tablets (5-10 mg total) by mouth 4 (four) times daily -  before meals and at bedtime. Start the evening of chemotherapy and take for 2 days, then take as needed (Patient not taking: Reported on 09/18/2020)  30 tablet 1  . Rivaroxaban (XARELTO) 15 MG TABS tablet Take 1 tablet (15 mg total) by mouth daily. 30 tablet 3  . spironolactone (ALDACTONE) 25 MG tablet Take 25 mg by mouth daily as needed (swelling).    . traMADol (ULTRAM) 50 MG tablet Take 1-2 tablets (50-100 mg total) by mouth every 6 (six) hours as needed. (Patient taking differently: Take 50-100 mg by mouth every 6 (  six) hours as needed for moderate pain. ) 60 tablet 0  . tretinoin (RETIN-A) 0.1 % cream Apply 1 application topically at bedtime as needed (acne).     Marland Kitchen UNABLE TO FIND Green Vibrance - 1 scoop daily    . warfarin (COUMADIN) 5 MG tablet Take 1 tablet (5 mg total) by mouth daily. 30 tablet 11   No current facility-administered medications for this visit.    OBJECTIVE: White woman who appears younger than stated age  5:   09/21/20 0840  BP: 101/60  Pulse: 71  Resp: 17  Temp: 97.8 F (36.6 C)  SpO2: 100%     Body mass index is 19.41 kg/m.   Wt Readings from Last 3 Encounters:  09/21/20 113 lb 1.6 oz (51.3 kg)  09/20/20 110 lb (49.9 kg)  09/13/20 112 lb 3.4 oz (50.9 kg)      ECOG FS:2 - Symptomatic, <50% confined to bed  Sclerae unicteric, EOMs intact Wearing a mask No cervical or supraclavicular adenopathy Lungs no rales or rhonchi Heart regular rate and rhythm Abd soft, nontender, positive bowel sounds MSK no focal spinal tenderness, no upper extremity lymphedema Neuro: nonfocal, well oriented, appropriate affect Breasts: Deferred   LAB RESULTS:  CMP     Component Value Date/Time   NA 136 09/08/2020 1141   NA 137 07/28/2020 1108   K 3.8 09/08/2020 1141   CL 100 09/08/2020 1141   CO2 30 09/08/2020 1141   GLUCOSE 104 (H) 09/08/2020 1141   BUN 9 09/08/2020 1141   BUN 8 07/28/2020 1108   CREATININE 0.78 09/08/2020 1141   CALCIUM 9.1 09/08/2020 1141   PROT 6.2 (L) 09/08/2020 1141   PROT 7.3 07/28/2020 1108   ALBUMIN 3.4 (L) 09/08/2020 1141   ALBUMIN 4.9 07/28/2020 1108   AST 36 09/08/2020  1141   ALT 48 (H) 09/08/2020 1141   ALKPHOS 115 09/08/2020 1141   BILITOT 0.3 09/08/2020 1141   GFRNONAA >60 09/08/2020 1141   GFRAA 95 07/28/2020 1108    No results found for: TOTALPROTELP, ALBUMINELP, A1GS, A2GS, BETS, BETA2SER, GAMS, MSPIKE, SPEI  Lab Results  Component Value Date   WBC 6.9 09/21/2020   NEUTROABS 5.9 09/21/2020   HGB 10.4 (L) 09/21/2020   HCT 31.1 (L) 09/21/2020   MCV 92.3 09/21/2020   PLT 360 09/21/2020    No results found for: LABCA2  No components found for: [id number]  Recent Labs  Lab 09/20/20 0625  INR 1.1    No results found for: LABCA2  No results found for: VFI433  No results found for: IRJ188  No results found for: CZY606  No results found for: CA2729  No components found for: HGQUANT  No results found for: CEA1 / No results found for: CEA1   No results found for: AFPTUMOR  No results found for: CHROMOGRNA  No results found for: KPAFRELGTCHN, LAMBDASER, KAPLAMBRATIO (kappa/lambda light chains)  No results found for: HGBA, HGBA2QUANT, HGBFQUANT, HGBSQUAN (Hemoglobinopathy evaluation)   No results found for: LDH  Lab Results  Component Value Date   IRON 88 10/29/2018   IRON 88 10/29/2018   TIBC 241 (L) 10/29/2018   TIBC 241 (L) 10/29/2018   IRONPCTSAT 37 10/29/2018   IRONPCTSAT 37 10/29/2018   (Iron and TIBC)  Lab Results  Component Value Date   FERRITIN 43 10/29/2018   FERRITIN 43 10/29/2018    Urinalysis    Component Value Date/Time   APPEARANCEUR Clear 08/08/2020 1359   GLUCOSEU Negative 08/08/2020 1359  BILIRUBINUR Negative 08/08/2020 1359   PROTEINUR Negative 08/08/2020 1359   NITRITE Negative 08/08/2020 1359   LEUKOCYTESUR Negative 08/08/2020 1359     STUDIES: CT HEAD W & WO CONTRAST  Result Date: 09/14/2020 CLINICAL DATA:  Breast cancer, staging. EXAM: CT HEAD WITHOUT AND WITH CONTRAST TECHNIQUE: Contiguous axial images were obtained from the base of the skull through the vertex without and with  intravenous contrast CONTRAST:  54m OMNIPAQUE IOHEXOL 300 MG/ML  SOLN COMPARISON:  None. FINDINGS: Brain: No evidence of acute infarction, hemorrhage, hydrocephalus, extra-axial collection or mass lesion/mass effect. No focus of abnormal contrast enhancement. Vascular: No hyperdense vessel or unexpected calcification. Visible vessels are patent. Skull: Normal. Negative for fracture or focal lesion. Sinuses/Orbits: No acute finding. IMPRESSION: No evidence of metastatic disease in the head. Please note that head CT has low sensitivity for small metastatic lesion. In case of high clinical suspicion, recommend MRI of the brain with contrast. Electronically Signed   By: KPedro EarlsM.D.   On: 09/14/2020 18:49   CT Chest W Contrast  Result Date: 09/14/2020 CLINICAL DATA:  New diagnosis of right breast cancer. Staging. Chemotherapy initiated 08/31/2020. EXAM: CT CHEST WITH CONTRAST TECHNIQUE: Multidetector CT imaging of the chest was performed during intravenous contrast administration. CONTRAST:  776mOMNIPAQUE IOHEXOL 300 MG/ML  SOLN COMPARISON:  None. FINDINGS: Cardiovascular: Normal heart size. No significant pericardial effusion/thickening. Minimally atherosclerotic nonaneurysmal thoracic aorta. Normal caliber pulmonary arteries. No central pulmonary emboli. Right internal jugular Port-A-Cath terminates in the lower third of the SVC. Subcutaneous emphysema at the port site compatible with recent placement. Mediastinum/Nodes: No discrete thyroid nodules. Unremarkable esophagus. No pathologically enlarged axillary, mediastinal or hilar lymph nodes. Lungs/Pleura: No pneumothorax. No pleural effusion. Coarsely calcified subcentimeter right upper lobe granuloma. Anterior left upper lobe 0.8 cm pulmonary nodule with coarse internal calcifications, also compatible with a granuloma. No acute consolidative airspace disease, lung masses or additional significant pulmonary nodules. Upper abdomen: No acute  abnormality. Musculoskeletal:  No aggressive appearing focal osseous lesions. IMPRESSION: 1. No evidence of metastatic disease in the chest. 2. Aortic Atherosclerosis (ICD10-I70.0). Electronically Signed   By: JaIlona Sorrel.D.   On: 09/14/2020 15:12   MR ABDOMEN WWO CONTRAST  Result Date: 08/24/2020 CLINICAL DATA:  Indeterminate liver lesions on recent CT. Right breast carcinoma. EXAM: MRI ABDOMEN WITHOUT AND WITH CONTRAST TECHNIQUE: Multiplanar multisequence MR imaging of the abdomen was performed both before and after the administration of intravenous contrast. CONTRAST:  1026mULTIHANCE GADOBENATE DIMEGLUMINE 529 MG/ML IV SOLN COMPARISON:  CT on 07/14/2020 FINDINGS: Lower chest: No acute findings. Hepatobiliary: Multiple hypovascular masses are now seen in the caudate, right, and left hepatic lobes. The largest in the caudate lobe measures 2.3 x 1.9 cm. The 2nd largest mass is seen in the anterior liver near the junction of the right and left lobes on image 17/17, which measures 1.2 x 1.2 cm. These findings are highly suspicious for liver metastases. A tiny less than 5 mm gallstone is noted, however there is no evidence of cholecystitis or biliary ductal dilatation. Pancreas:  No mass or inflammatory changes. Spleen:  Within normal limits in size and appearance. Adrenals/Urinary Tract: No masses identified. No evidence of hydronephrosis. Stomach/Bowel: Visualized portion unremarkable. Vascular/Lymphatic: No pathologically enlarged lymph nodes identified. No abdominal aortic aneurysm. Other:  None. Musculoskeletal:  No suspicious bone lesions identified. IMPRESSION: Multiple hypovascular masses in the caudate, right, and left hepatic lobes, highly suspicious for liver metastases. No other sites of abdominal metastatic disease identified.  Tiny gallstone. No radiographic evidence of cholecystitis or biliary ductal dilatation. Electronically Signed   By: Marlaine Hind M.D.   On: 08/24/2020 16:08   NM Bone Scan  Whole Body  Result Date: 09/14/2020 CLINICAL DATA:  Breast cancer, new diagnosis EXAM: NUCLEAR MEDICINE WHOLE BODY BONE SCAN TECHNIQUE: Whole body anterior and posterior images were obtained approximately 3 hours after intravenous injection of radiopharmaceutical. RADIOPHARMACEUTICALS:  20.5 mCi Technetium-8mMDP IV COMPARISON:  None Correlation: CT chest 09/14/2020 FINDINGS: Minimal uptake of tracer at the [facility]s and sternoclavicular joints, as well as RIGHT knee likely degenerative. Mildly inhomogeneous tracer uptake in the posterior ribs bilaterally, without sclerosis or fractures on CT, may represent artifact. No definite sites of abnormal tracer uptake are seen to suggest osseous metastases. Expected urinary tract and soft tissue distribution of tracer. IMPRESSION: No definite scintigraphic evidence of osseous metastatic disease as above. Electronically Signed   By: MLavonia DanaM.D.   On: 09/14/2020 17:39   [place]BIOPSY (LIVER)  Result Date: 09/20/2020 INDICATION: History of right breast carcinoma with probable metastatic lesions in the liver including a 2.5 cm mass in the caudate lobe of the liver. EXAM: ULTRASOUND GUIDED CORE BIOPSY OF LIVER MASS MEDICATIONS: None. ANESTHESIA/SEDATION: Fentanyl 75 mcg IV; Versed 1.5 mg IV Moderate Sedation Time:  31 minutes. The patient was continuously monitored during the procedure by the interventional radiology nurse under my direct supervision. PROCEDURE: The procedure, risks, benefits, and alternatives were explained to the patient. Questions regarding the procedure were encouraged and answered. The patient understands and consents to the procedure. A time-out was performed prior to initiating the procedure. Ultrasound was performed of the liver with various probes to localize lesions. The abdominal wall was prepped with chlorhexidine in a sterile fashion, and a sterile drape was applied covering the operative field. A sterile gown and sterile gloves were used  for the procedure. Local anesthesia was provided with 1% Lidocaine. Under ultrasound guidance, a 148gauge trocar needle was advanced to the level of a lesion within the caudate lobe of the liver near its junction with the right lobe and just anterior to the IVC. After confirming needle tip position, 3 separate coaxial 18 gauge core biopsy samples were obtained and submitted in formalin. A slurry of Gel-Foam pledgets was injected via the 17 gauge needle as the needle was retracted on completion. COMPLICATIONS: None immediate. FINDINGS: The only lesion visible in the liver by ultrasound is the roughly 2.5 cm caudate mass which is vaguely marginated but localized by ultrasound in the exact location seen by prior ultrasound and by MRI. Solid tissue samples were obtained. IMPRESSION: Ultrasound-guided core biopsy performed at the level of a 2.5 cm mass in the caudate lobe of the liver. Electronically Signed   By: GAletta EdouardM.D.   On: 09/20/2020 11:19   DG Abd 2 Views  Result Date: 09/04/2020 CLINICAL DATA:  Constipation, diffuse abdominal pain, breast cancer. EXAM: ABDOMEN - 2 VIEW COMPARISON:  None. FINDINGS: Fair amount of stool is seen in the colon. No small bowel dilatation. No unexpected radiopaque calculi. IMPRESSION: Fair amount of stool in the colon is indicative of constipation. Electronically Signed   By: MLorin PicketM.D.   On: 09/04/2020 10:53   DG Fluoro Guide CV Line Right  Result Date: 09/13/2020 CLINICAL DATA:  54year old female undergoing placement of a surgical port catheter. EXAM: CHEST FLUOROSCOPY TECHNIQUE: Real-time fluoroscopic evaluation of the chest was performed. FLUOROSCOPY TIME:  Fluoroscopy Time:  0 minutes 20 seconds  Radiation Exposure Index (if provided by the fluoroscopic device): 0.88 mGy COMPARISON:  None. FINDINGS: A single saved fluoro images submitted for review. The image demonstrates a right IJ approach single-lumen power injectable port catheter. The catheter tip  overlies the superior cavoatrial junction. No evidence of pneumothorax. IMPRESSION: Right IJ approach single-lumen power injectable port catheter. The catheter tip overlies the superior cavoatrial junction. Electronically Signed   By: Jacqulynn Cadet M.D.   On: 09/13/2020 16:15   MM CLIP PLACEMENT RIGHT  Result Date: 09/05/2020 CLINICAL DATA:  Status post MRI guided biopsy today of a suspicious mass within the upper-outer quadrant the RIGHT breast. Patient had an earlier ultrasound-guided biopsy confirming malignancy within the RIGHT breast at the 6 o'clock axis. An additional earlier stereotactic biopsy of the posterior RIGHT breast revealed a benign pathology result. EXAM: DIAGNOSTIC RIGHT MAMMOGRAM POST MRI BIOPSY COMPARISON:  Previous exam(s). FINDINGS: Mammographic images were obtained following MRI guided biopsy of the suspicious mass within the upper-outer quadrant of the RIGHT breast. The biopsy marking clip is approximately 5 mm inferomedial to the biopsy site. IMPRESSION: 1. Today's BARBELL shaped clip is adequately positioned approximately 5 mm inferomedial to the biopsy cavity. 2. A ribbon shaped clip corresponds to a biopsy-proven cancer within the RIGHT breast at the 6 o'clock axis. 3. A coil shaped clip within the posterior RIGHT breast corresponds to a previous benign stereotactic biopsy. Final Assessment: Post Procedure Mammograms for Marker Placement Electronically Signed   By: Franki Cabot M.D.   On: 09/05/2020 09:12   MR RT BREAST BX W LOC DEV 1ST LESION IMAGE BX SPEC MR GUIDE  Addendum Date: 09/07/2020   ADDENDUM REPORT: 09/07/2020 14:20 ADDENDUM: Pathology revealed SCLEROTIC LESION WITH FOCAL LOBULAR NEOPLASIA (ATYPICAL LOBULAR HYPERPLASIA) od the Right breast, upper outer quadrant. The differential diagnosis includes a sclerotic fibroadenoma and sclerotic papilloma. This was found to be concordant by Dr. Franki Cabot, with excision recommended. Pathology results were discussed  with the patient by telephone. The patient reported doing well after the biopsy with tenderness at the site. Post biopsy instructions and care were reviewed and questions were answered. The patient was encouraged to call [facility] for any additional concerns. My direct phone number was provided. The patient has a recent diagnosis of Right breast cancer and should follow her outlined treatment plan. Dr. Rolm Bookbinder was notified of biopsy results via EPIC message on September 07, 2020. There are 2 additional suspicious masses within the inner Right breast. If breast conservation is still considered after this biopsy, the patient will need to be scheduled for the 2 additional masses in the inner Right breast. Per my conversation with Dr. Donne Hazel today, additional MRI-guided biopsies will not be requested/ordered at this time. Pathology results reported by Terie Purser, RN on 09/07/2020. Electronically Signed   By: Franki Cabot M.D.   On: 09/07/2020 14:20   Result Date: 09/07/2020 CLINICAL DATA:  Patient with a known biopsy-proven RIGHT breast cancer at the 6 o'clock axis. Subsequent breast MRI revealing 3 additional suspicious masses within the RIGHT breast, 2 of which are located within the inner RIGHT breast and the third mass within the upper-outer quadrant. This mass within the upper-outer quadrant is farthest from the known cancer so will be biopsied today. EXAM: MRI GUIDED CORE NEEDLE BIOPSY OF THE RIGHT BREAST TECHNIQUE: Multiplanar, multisequence MR imaging of the RIGHT breast was performed both before and after administration of intravenous contrast. CONTRAST:  41m GADAVIST GADOBUTROL 1 MMOL/ML IV SOLN  COMPARISON:  Previous exams. FINDINGS: I met with the patient, and we discussed the procedure of MRI guided biopsy, including risks, benefits, and alternatives. Specifically, we discussed the risks of infection, bleeding, tissue injury, clip migration, and inadequate  sampling. Informed, written consent was given. The usual time out protocol was performed immediately prior to the procedure. Using sterile technique, 1% Lidocaine, MRI guidance, and a 9 gauge vacuum assisted device, biopsy was performed of the suspicious mass within the upper-outer quadrant of the RIGHT breast using a lateral approach. At the conclusion of the procedure, a barbell shaped tissue marker clip was deployed into the biopsy cavity. Follow-up 2-view mammogram was performed and dictated separately. IMPRESSION: MRI guided biopsy of the suspicious mass within the upper-outer quadrant of the RIGHT breast. No apparent complications. Electronically Signed: By: Franki Cabot M.D. On: 09/05/2020 09:05   US Abdomen Limited RUQ (LIVER/GB)  Result Date: 09/04/2020 CLINICAL DATA:  History of breast cancer. EXAM: ULTRASOUND ABDOMEN LIMITED RIGHT UPPER QUADRANT COMPARISON:  August 24, 2020.  July 14, 2020. FINDINGS: Gallbladder: Probable 6 mm calculus is noted. No gallbladder wall thickening or pericholecystic fluid is noted. No sonographic Murphy's sign is noted. Common bile duct: Diameter: 5 mm which is within normal limits. Liver: 2.6 cm solid lesion is seen in the region of the caudate lobe which corresponds to metastatic lesion seen on prior MRI. The other metastatic lesions noted on prior MRI are not well visualized on this study. Within normal limits in parenchymal echogenicity. Portal vein is patent on color Doppler imaging with normal direction of blood flow towards the liver. Other: None. IMPRESSION: 2.6 cm solid lesion seen in the caudate lobe of the liver consistent with metastatic disease as described on prior MRI. Cholelithiasis is noted without evidence of cholecystitis. Electronically Signed   By: Marijo Conception M.D.   On: 09/04/2020 09:20     ELIGIBLE FOR AVAILABLE RESEARCH PROTOCOL: AET  ASSESSMENT: 54 y.o. [place] woman status post right breast lower outer quadrant biopsy 08/04/2020  for a clinical T2 N0 M1, stage IV invasive ductal carcinoma, grade 3, triple positive, with an MIB-1 of 40%  (a) breast MRI 08/14/2020 shows 3 additional right breast lesions and heterogeneous enhancement of the sternum  (b) biopsy of the additional right breast masses  (c) CT scan of the abdomen and pelvis 07/14/2020 shows 2 indeterminate liver lesions  (d) abdominal MRI 08/24/2020 shows multiple liver lesions highly suspicious for liver metastases.  (e) liver biopsy 09/20/2020  (f) bone scan 09/14/2020 shows no evidence of bone metastases  (g) CT scan of the chest and head 09/14/2020 showed no evidence of metastatic disease   (1) neoadjuvant chemotherapy to consist of docetaxel, carboplatin, trastuzumab and pertuzumab every 21 days x 6 started 08/31/2020  (2) trastuzumab to continue to complete a year (through October 2022).  (a) echo 04/12/2020 shows an ejection fraction in the 60-65% range  (3) definitive surgery to follow  (4) adjuvant radiation as appropriate  (5) antiestrogens to start at the completion of local treatment   PLAN: Rayma is thankfully done with the extensive work-up she had.  Right now all she needs to do is get through her chemotherapy and then of course her surgery.  We have stopped her Plaquenil so I think mild sore issues are going to be much less this time.  In addition she will take Diflucan for 5 days beginning today.  She has a little bit of rosacea.  I am writing for [employer] and she  will give that a try and see if it controls the problem.  Today we talked about pain which she may have from the PEG fill gastrium.  She may take up to 1 g of Tylenol 3 times a day and up to 100 mg of tramadol 4 times a day.  If that does not control the pain she will let me know.  She continues constipated.  This is a lifelong issue for her.  She is taking MiraLAX daily and stool softener 2 tablets twice daily.  If she has no bowel movement on day 3 she will either use  magnesium citrate or any laxative of choice.  They are still quite dissatisfied with some of the service that they have received or not received and there are constant communication issues.  I think the patient experienced group really should contact them and take all this information down.  I am hopeful that we will get good news on the liver biopsy but they understand this will not be ready possibly until next week  I am going to have a virtual visit with Jezabel in a week just to make sure everything is going well.  They know to call for any other issue that may develop before then.  Total encounter time 35 minutes.Sarajane Jews C. Magrinat, MD 09/21/2020 8:52 AM Medical Oncology and Hematology [facility] [place],  [address] Tel. [phone]    Fax. [phone]   This document serves as a record of services personally performed by Lurline Del, MD. It was created on his behalf by Wilburn Mylar, a trained medical scribe. The creation of this record is based on the scribe's personal observations and the provider's statements to them.   I, Lurline Del MD, have reviewed the above documentation for accuracy and completeness, and I agree with the above.   *Total Encounter Time as defined by the Centers for Medicare and Medicaid Services includes, in addition to the face-to-face time of a patient visit (documented in the note above) non-face-to-face time: obtaining and reviewing outside history, ordering and reviewing medications, tests or procedures, care coordination (communications with other health care professionals or caregivers) and documentation in the medical record.

## 2020-09-21 ENCOUNTER — Inpatient Hospital Stay: Payer: 59

## 2020-09-21 ENCOUNTER — Other Ambulatory Visit: Payer: Self-pay

## 2020-09-21 ENCOUNTER — Inpatient Hospital Stay: Payer: 59 | Attending: Oncology

## 2020-09-21 ENCOUNTER — Other Ambulatory Visit: Payer: Self-pay | Admitting: *Deleted

## 2020-09-21 ENCOUNTER — Inpatient Hospital Stay (HOSPITAL_BASED_OUTPATIENT_CLINIC_OR_DEPARTMENT_OTHER): Payer: 59 | Admitting: Oncology

## 2020-09-21 ENCOUNTER — Inpatient Hospital Stay: Payer: 59 | Admitting: Nutrition

## 2020-09-21 ENCOUNTER — Inpatient Hospital Stay: Payer: 59 | Admitting: Licensed Clinical Social Worker

## 2020-09-21 ENCOUNTER — Encounter: Payer: Self-pay | Admitting: *Deleted

## 2020-09-21 VITALS — BP 101/60 | HR 71 | Temp 97.8°F | Resp 17 | Ht 64.0 in | Wt 113.1 lb

## 2020-09-21 DIAGNOSIS — Z86711 Personal history of pulmonary embolism: Secondary | ICD-10-CM

## 2020-09-21 DIAGNOSIS — Z170 Estrogen receptor positive status [ER+]: Secondary | ICD-10-CM | POA: Diagnosis not present

## 2020-09-21 DIAGNOSIS — C50511 Malignant neoplasm of lower-outer quadrant of right female breast: Secondary | ICD-10-CM

## 2020-09-21 DIAGNOSIS — I700 Atherosclerosis of aorta: Secondary | ICD-10-CM | POA: Insufficient documentation

## 2020-09-21 DIAGNOSIS — R1012 Left upper quadrant pain: Secondary | ICD-10-CM | POA: Diagnosis not present

## 2020-09-21 DIAGNOSIS — L930 Discoid lupus erythematosus: Secondary | ICD-10-CM

## 2020-09-21 DIAGNOSIS — Z86718 Personal history of other venous thrombosis and embolism: Secondary | ICD-10-CM

## 2020-09-21 DIAGNOSIS — R609 Edema, unspecified: Secondary | ICD-10-CM | POA: Insufficient documentation

## 2020-09-21 DIAGNOSIS — Z5111 Encounter for antineoplastic chemotherapy: Secondary | ICD-10-CM | POA: Diagnosis present

## 2020-09-21 DIAGNOSIS — Z5112 Encounter for antineoplastic immunotherapy: Secondary | ICD-10-CM | POA: Diagnosis not present

## 2020-09-21 DIAGNOSIS — C787 Secondary malignant neoplasm of liver and intrahepatic bile duct: Secondary | ICD-10-CM | POA: Insufficient documentation

## 2020-09-21 DIAGNOSIS — Z95828 Presence of other vascular implants and grafts: Secondary | ICD-10-CM

## 2020-09-21 LAB — CBC WITH DIFFERENTIAL (CANCER CENTER ONLY)
Abs Immature Granulocytes: 0.04 10*3/uL (ref 0.00–0.07)
Basophils Absolute: 0 10*3/uL (ref 0.0–0.1)
Basophils Relative: 0 %
Eosinophils Absolute: 0 10*3/uL (ref 0.0–0.5)
Eosinophils Relative: 0 %
HCT: 31.1 % — ABNORMAL LOW (ref 36.0–46.0)
Hemoglobin: 10.4 g/dL — ABNORMAL LOW (ref 12.0–15.0)
Immature Granulocytes: 1 %
Lymphocytes Relative: 11 %
Lymphs Abs: 0.8 10*3/uL (ref 0.7–4.0)
MCH: 30.9 pg (ref 26.0–34.0)
MCHC: 33.4 g/dL (ref 30.0–36.0)
MCV: 92.3 fL (ref 80.0–100.0)
Monocytes Absolute: 0.2 10*3/uL (ref 0.1–1.0)
Monocytes Relative: 3 %
Neutro Abs: 5.9 10*3/uL (ref 1.7–7.7)
Neutrophils Relative %: 85 %
Platelet Count: 360 10*3/uL (ref 150–400)
RBC: 3.37 MIL/uL — ABNORMAL LOW (ref 3.87–5.11)
RDW: 13.2 % (ref 11.5–15.5)
WBC Count: 6.9 10*3/uL (ref 4.0–10.5)
nRBC: 0 % (ref 0.0–0.2)

## 2020-09-21 LAB — CMP (CANCER CENTER ONLY)
ALT: 27 U/L (ref 0–44)
AST: 19 U/L (ref 15–41)
Albumin: 3.4 g/dL — ABNORMAL LOW (ref 3.5–5.0)
Alkaline Phosphatase: 72 U/L (ref 38–126)
Anion gap: 7 (ref 5–15)
BUN: 7 mg/dL (ref 6–20)
CO2: 25 mmol/L (ref 22–32)
Calcium: 8.9 mg/dL (ref 8.9–10.3)
Chloride: 104 mmol/L (ref 98–111)
Creatinine: 0.68 mg/dL (ref 0.44–1.00)
GFR, Estimated: >60 mL/min (ref >60)
Glucose, Bld: 133 mg/dL — ABNORMAL HIGH (ref 70–99)
Potassium: 3.9 mmol/L (ref 3.5–5.1)
Sodium: 136 mmol/L (ref 135–145)
Total Bilirubin: 0.4 mg/dL (ref 0.3–1.2)
Total Protein: 6.4 g/dL — ABNORMAL LOW (ref 6.5–8.1)

## 2020-09-21 MED ORDER — LORAZEPAM 2 MG/ML IJ SOLN
INTRAMUSCULAR | Status: AC
  Filled 2020-09-21: qty 1

## 2020-09-21 MED ORDER — TRASTUZUMAB-ANNS CHEMO 150 MG IV SOLR
300.0000 mg | Freq: Once | INTRAVENOUS | Status: AC
  Administered 2020-09-21: 300 mg via INTRAVENOUS
  Filled 2020-09-21: qty 14.29

## 2020-09-21 MED ORDER — SODIUM CHLORIDE 0.9 % IV SOLN
420.0000 mg | Freq: Once | INTRAVENOUS | Status: AC
  Administered 2020-09-21: 420 mg via INTRAVENOUS
  Filled 2020-09-21: qty 14

## 2020-09-21 MED ORDER — DIPHENHYDRAMINE HCL 25 MG PO CAPS
ORAL_CAPSULE | ORAL | Status: AC
  Filled 2020-09-21: qty 1

## 2020-09-21 MED ORDER — SODIUM CHLORIDE 0.9 % IV SOLN
75.0000 mg/m2 | Freq: Once | INTRAVENOUS | Status: AC
  Administered 2020-09-21: 110 mg via INTRAVENOUS
  Filled 2020-09-21: qty 11

## 2020-09-21 MED ORDER — SODIUM CHLORIDE 0.9 % IV SOLN
16.0000 mg | Freq: Once | INTRAVENOUS | Status: AC
  Administered 2020-09-21: 16 mg via INTRAVENOUS
  Filled 2020-09-21: qty 8

## 2020-09-21 MED ORDER — SODIUM CHLORIDE 0.9 % IV SOLN
Freq: Once | INTRAVENOUS | Status: AC
  Filled 2020-09-21: qty 250

## 2020-09-21 MED ORDER — LORAZEPAM 2 MG/ML IJ SOLN
0.5000 mg | Freq: Once | INTRAMUSCULAR | Status: AC
  Administered 2020-09-21: 0.5 mg via INTRAVENOUS

## 2020-09-21 MED ORDER — SODIUM CHLORIDE 0.9% FLUSH
10.0000 mL | INTRAVENOUS | Status: DC | PRN
  Administered 2020-09-21: 10 mL
  Filled 2020-09-21: qty 10

## 2020-09-21 MED ORDER — METRONIDAZOLE 1 % EX GEL: Freq: Every day | CUTANEOUS | 0 refills | Status: DC

## 2020-09-21 MED ORDER — ACETAMINOPHEN 325 MG PO TABS
650.0000 mg | ORAL_TABLET | Freq: Once | ORAL | Status: AC
  Administered 2020-09-21: 650 mg via ORAL

## 2020-09-21 MED ORDER — PROCHLORPERAZINE MALEATE 10 MG PO TABS
ORAL_TABLET | ORAL | Status: AC
  Filled 2020-09-21: qty 1

## 2020-09-21 MED ORDER — DIPHENHYDRAMINE HCL 25 MG PO CAPS
25.0000 mg | ORAL_CAPSULE | Freq: Once | ORAL | Status: AC
  Administered 2020-09-21: 25 mg via ORAL

## 2020-09-21 MED ORDER — SODIUM CHLORIDE 0.9 % IV SOLN
449.5000 mg | Freq: Once | INTRAVENOUS | Status: AC
  Administered 2020-09-21: 450 mg via INTRAVENOUS
  Filled 2020-09-21: qty 45

## 2020-09-21 MED ORDER — SODIUM CHLORIDE 0.9% FLUSH
10.0000 mL | INTRAVENOUS | Status: DC | PRN
  Administered 2020-09-21: 10 mL via INTRAVENOUS
  Filled 2020-09-21: qty 10

## 2020-09-21 MED ORDER — SODIUM CHLORIDE 0.9 % IV SOLN
10.0000 mg | Freq: Once | INTRAVENOUS | Status: AC
  Administered 2020-09-21: 10 mg via INTRAVENOUS
  Filled 2020-09-21: qty 10

## 2020-09-21 MED ORDER — SODIUM CHLORIDE 0.9 % IV SOLN
150.0000 mg | Freq: Once | INTRAVENOUS | Status: AC
  Administered 2020-09-21: 150 mg via INTRAVENOUS
  Filled 2020-09-21: qty 150

## 2020-09-21 MED ORDER — ACETAMINOPHEN 325 MG PO TABS
ORAL_TABLET | ORAL | Status: AC
  Filled 2020-09-21: qty 2

## 2020-09-21 MED ORDER — HEPARIN SOD (PORK) LOCK FLUSH 100 UNIT/ML IV SOLN
500.0000 [IU] | Freq: Once | INTRAVENOUS | Status: AC | PRN
  Administered 2020-09-21: 500 [IU]
  Filled 2020-09-21: qty 5

## 2020-09-21 NOTE — Patient Instructions (Signed)

## 2020-09-21 NOTE — Progress Notes (Signed)
[facility] CSW Progress Note  [facility] representative met with patient to provide support around diagnosis and connection to resources. Patient signed authorization form for [facility] submitted. CSW answered questions regarding breast cancer foundation applications and provided hard copies for pt. Pt inquired re: having a family member/ friend who is providing caregiving being paid. CSW unaware of a resource through this based on pt's insurance, but will notify patient if any resources become known.  Patient to let CSW know when she is ready to submit applications.   Christeen Douglas LCSW

## 2020-09-21 NOTE — Patient Instructions (Signed)
[facility] Discharge Instructions for Patients Receiving Chemotherapy  Today you received the following chemotherapy agents: Trastuzumab, Pertuzumab, Docetaxel, and Carboplatin  To help prevent nausea and vomiting after your treatment, we encourage you to take your nausea medication  as prescribed.    If you develop nausea and vomiting that is not controlled by your nausea medication, call the clinic.   BELOW ARE SYMPTOMS THAT SHOULD BE REPORTED IMMEDIATELY:  *FEVER GREATER THAN 100.5 F  *CHILLS WITH OR WITHOUT FEVER  NAUSEA AND VOMITING THAT IS NOT CONTROLLED WITH YOUR NAUSEA MEDICATION  *UNUSUAL SHORTNESS OF BREATH  *UNUSUAL BRUISING OR BLEEDING  TENDERNESS IN MOUTH AND THROAT WITH OR WITHOUT PRESENCE OF ULCERS  *URINARY PROBLEMS  *BOWEL PROBLEMS  UNUSUAL RASH Items with * indicate a potential emergency and should be followed up as soon as possible.  Feel free to call the clinic should you have any questions or concerns. The clinic phone number is [phone].  Please show the [facility] at check-in to the Emergency Department and triage nurse.

## 2020-09-21 NOTE — Progress Notes (Signed)
54 year old female diagnosed with stage IV triple A positive breast cancer.  She is followed by Dr. Jana Hakim.  She is receiving TCHP.  Past medical history includes Raynaud's syndrome, pulmonary embolus, lupus, irritable bowel syndrome, depression, chronic fatigue, anxiety, and arthritis.  Medications include Adderall XR, vitamin C, vitamin D3, Decadron, Colace, Diflucan, Ativan, Magic mouthwash, Reglan, MiraLAX, Compazine.  Labs include glucose 133 and albumin 3.4.  Height: 5 feet 4 inches. Weight: 113.1 pounds on November 11. Patient weighed 117 pounds in March 2021. BMI: 19.41.  Had a difficult time staying awake during conversation. Noted mucositis and taste alterations. Complains of constipation. States she tries to eat smaller amounts of food more often.  Nutrition diagnosis:  Food and nutrition related knowledge deficit related to breast cancer and associated treatments as evidenced by no prior need for nutrition related information.  Intervention: Educated patient to consume smaller more frequent meals and snacks with high-calorie, high-protein foods.  Provided fact sheet. Reviewed strategies for improving taste alterations and provided fact sheet.  Recommended baking soda and salt water rinses. Educated patient on improving constipation and provided fact sheet.  Encourage medication as ordered by MD. Provided contact information.  Encouraged her to call me with questions.  Monitoring, evaluation, goals: Patient will tolerate adequate calories and protein to minimize weight loss throughout treatment.  Next visit: Will follow patient as needed.  Patient will contact me with questions or concerns prior to next follow-up.  **Disclaimer: This note was dictated with voice recognition software. Similar sounding words can inadvertently be transcribed and this note may contain transcription errors which may not have been corrected upon publication of note.**

## 2020-09-22 ENCOUNTER — Other Ambulatory Visit: Payer: Self-pay | Admitting: *Deleted

## 2020-09-22 DIAGNOSIS — C50511 Malignant neoplasm of lower-outer quadrant of right female breast: Secondary | ICD-10-CM

## 2020-09-22 MED ORDER — SODIUM CHLORIDE 0.9 % IV SOLN
Freq: Once | INTRAVENOUS | Status: DC
  Filled 2020-09-22: qty 250

## 2020-09-23 ENCOUNTER — Inpatient Hospital Stay: Payer: 59

## 2020-09-23 ENCOUNTER — Other Ambulatory Visit: Payer: Self-pay

## 2020-09-23 VITALS — BP 112/56 | HR 62 | Temp 98.2°F | Resp 17

## 2020-09-23 DIAGNOSIS — Z5112 Encounter for antineoplastic immunotherapy: Secondary | ICD-10-CM | POA: Diagnosis not present

## 2020-09-23 DIAGNOSIS — E860 Dehydration: Secondary | ICD-10-CM

## 2020-09-23 MED ORDER — SODIUM CHLORIDE 0.9% FLUSH
10.0000 mL | Freq: Once | INTRAVENOUS | Status: AC
  Administered 2020-09-23: 10 mL via INTRAVENOUS
  Filled 2020-09-23: qty 10

## 2020-09-23 MED ORDER — HEPARIN SOD (PORK) LOCK FLUSH 100 UNIT/ML IV SOLN
500.0000 [IU] | Freq: Once | INTRAVENOUS | Status: AC
  Administered 2020-09-23: 500 [IU] via INTRAVENOUS
  Filled 2020-09-23: qty 5

## 2020-09-23 MED ORDER — SODIUM CHLORIDE 0.9 % IV SOLN
Freq: Once | INTRAVENOUS | Status: AC
  Filled 2020-09-23: qty 250

## 2020-09-23 NOTE — Patient Instructions (Signed)

## 2020-09-23 NOTE — Progress Notes (Signed)
Pt discharged in no apparent distress. Pt left ambulatory without assistance. Pt aware of discharge instructions and verbalized understanding and had no further questions.  

## 2020-09-25 ENCOUNTER — Other Ambulatory Visit: Payer: Self-pay | Admitting: Oncology

## 2020-09-25 ENCOUNTER — Inpatient Hospital Stay (HOSPITAL_BASED_OUTPATIENT_CLINIC_OR_DEPARTMENT_OTHER): Payer: 59 | Admitting: Oncology

## 2020-09-25 ENCOUNTER — Other Ambulatory Visit: Payer: Self-pay

## 2020-09-25 ENCOUNTER — Ambulatory Visit (HOSPITAL_COMMUNITY)
Admission: RE | Admit: 2020-09-25 | Discharge: 2020-09-25 | Disposition: A | Discharge status: Home / Self Care | Payer: 59 | Source: Ambulatory Visit | Attending: Oncology | Admitting: Oncology

## 2020-09-25 VITALS — BP 111/65 | HR 73 | Temp 97.7°F | Resp 18 | Ht 64.0 in | Wt 114.8 lb

## 2020-09-25 DIAGNOSIS — Z5112 Encounter for antineoplastic immunotherapy: Secondary | ICD-10-CM | POA: Diagnosis not present

## 2020-09-25 DIAGNOSIS — C50511 Malignant neoplasm of lower-outer quadrant of right female breast: Secondary | ICD-10-CM | POA: Diagnosis not present

## 2020-09-25 DIAGNOSIS — Z5181 Encounter for therapeutic drug level monitoring: Secondary | ICD-10-CM | POA: Diagnosis present

## 2020-09-25 DIAGNOSIS — Z87891 Personal history of nicotine dependence: Secondary | ICD-10-CM | POA: Insufficient documentation

## 2020-09-25 DIAGNOSIS — Z0189 Encounter for other specified special examinations: Secondary | ICD-10-CM | POA: Diagnosis not present

## 2020-09-25 DIAGNOSIS — Z79899 Other long term (current) drug therapy: Secondary | ICD-10-CM | POA: Diagnosis not present

## 2020-09-25 DIAGNOSIS — Z170 Estrogen receptor positive status [ER+]: Secondary | ICD-10-CM

## 2020-09-25 LAB — SURGICAL PATHOLOGY

## 2020-09-25 NOTE — Progress Notes (Unsigned)
I called Erica Wood and discussed the pathology from her liver biopsy which is positive.  This does not alter our overall treatment plan it does mean however that she will be long-term on anti-HER-2 treatment.  She tells me her legs have swollen.  She has no swelling anywhere else.  She has an echo today.  I asked her to drop by around 1230 today just to catch up.

## 2020-09-25 NOTE — Progress Notes (Signed)
[place]  [phone] Fax:([phone]) [phone]     ID: Erica Wood DOB: [birth year]  MR#: [id number]  ERX#:[id number]  Patient Care Team: Eunice Blase, MD as PCP - General (Family Medicine) Prudence Davidson, Huntley Bing, MD as Referring Physician (Oncology) Rockwell Germany, RN as Oncology Nurse Navigator Mauro Kaufmann, RN as Oncology Nurse Navigator Rolm Bookbinder, MD as Consulting Physician (General Surgery) Brylinn Teaney, Virgie Dad, MD as Consulting Physician (Oncology) Kyung Rudd, MD as Consulting Physician (Radiation Oncology) Bo Merino, MD as Consulting Physician (Rheumatology) Olga Millers, MD as Consulting Physician (Obstetrics and Gynecology) Chauncey Cruel, MD OTHER MD:  CHIEF COMPLAINT: triple positive breast cancer  CURRENT TREATMENT: Neoadjuvant therapy    INTERVAL HISTORY: Erica Wood returns today for follow up of her triple positive breast cancer.  She called our office to report that her legs were swollen and we asked her to come in for further evaluation  She began neoadjuvant chemotherapy, consisting of docetaxel, carboplatin, trastuzumab, and pertuzumab every 21 days x6, on 08/31/2020.  Today is day 5 cycle 2.  She had liver biopsy on 09/20/2020, which shows carcinoma consistent with her breast primary.  The prognostic panel is pending (requested today).Marland Kitchen   REVIEW OF SYSTEMS: Latricia did much better with the second cycle.  She had no mouth sores, no nausea or vomiting.  She does have altered taste and that is a real issue for her.  She does not find that her arthritis is any worse off the Plaquenil.  She also has had no fevers rash or bleeding issues.  A detailed review of systems was otherwise stable   COVID 19 VACCINATION STATUS:    HISTORY OF CURRENT ILLNESS: From the original intake note:  Erica Wood herself palpated an abnormality in the right breast several years ago.  This had not changed until sometime in June 2021.  She was  evaluated by Dr. Ouida Sills and underwent right diagnostic mammography with tomography and right breast ultrasonography at [facility] on 07/26/2020 showing: breast density category D; 2.6 cm mass in right breast at 6 o'clock, at site of palpable concern; two focal groups of punctate calcifications posterior to palpable mass; no right axillary adenopathy.  Accordingly on 08/04/2020 she proceeded to biopsy of the right breast area in question. The pathology from this procedure ([id number]) showed: invasive ductal carcinoma, grade 3. Prognostic indicators significant for: estrogen receptor, 95% positive with moderate staining intensity and progesterone receptor, 10% positive with strong staining intensity. Proliferation marker Ki67 at 40%. HER2 equivocal by immunohistochemistry (2+), but positive by fluorescent in situ hybridization with a signals ratio 3.23 and number per cell 6.45.  Biopsy of the posterior calcifications was benign.  She also underwent breast MRI on 08/11/2020 showing: breast composition D; 2.6 cm biopsy-proven malignancy in right breast at 6 o'clock, with probable skin involvement noted on previous ultrasound; additional enhancing masses within right breast-- 5 mm in lower-outer, 1.2 cm in upper-inner, 7 mm in upper-outer; no evidence of left breast malignancy or lymphadenopathy; heterogeneous enhancement of sternum, of uncertain significance.  The patient's subsequent history is as detailed below.   PAST MEDICAL HISTORY: Past Medical History:  Diagnosis Date  . Anti-cardiolipin antibody positive   . Anti-cardiolipin antibody syndrome (HCC)   . Anxiety   . Arthralgia   . Arthritis    bil knees, hands  . Atypical chest pain   . Autoimmune disease ([place])   . Cancer ([place]) 07/2020   right breast IDC  . Chronic fatigue   .  Depression   . DVT (deep venous thrombosis) ([place])   . IBS (irritable bowel syndrome)    with constipation  . Lupus ([place])   . Pulmonary embolus ([place])    . Raynaud's syndrome   . Sicca ([place])   . Thrombocytopenia ([place])     PAST SURGICAL HISTORY: Past Surgical History:  Procedure Laterality Date  . ABLATION  12/2018   leg veins  . ABLATION Right 08/2019   venous leg   . APPENDECTOMY    . COLON SURGERY     Perforated colon repair after colostomy  . GANGLION CYST EXCISION    . HYSTEROSCOPY WITH D & C N/A 01/13/2014   Procedure: DILATATION AND CURETTAGE /HYSTEROSCOPY with resection ;  Surgeon: Olga Millers, MD;  Location: [facility];  Service: Gynecology;  Laterality: N/A;  . LAPAROTOMY    . PORTACATH PLACEMENT N/A 09/13/2020   Procedure: INSERTION PORT-A-CATH WITH ULTRASOUND GUIDANCE;  Surgeon: Rolm Bookbinder, MD;  Location: [facility];  Service: General;  Laterality: N/A;    FAMILY HISTORY: Family History  Problem Relation Age of Onset  . Rheum arthritis Father   . Hemachromatosis Father   . Cancer Sister        bone   as of October 2021 her father isage 79 and her mother age 36.. The patient had one sister (and no brothers). The sister was diagnosed with Ewing's sarcoma at age 60, and died from the disease after many difficult treatments.    GYNECOLOGIC HISTORY:  No LMP recorded. (Menstrual status: Irregular Periods).  Menarche: 6 or 54 years old GX P 0 LMP around 2019 Contraceptive: yes HRT no  Hysterectomy? no BSO? 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 She is widowed. She lives at home with her cat, Mariea Clonts. She is not a Ambulance person.    ADVANCED DIRECTIVES: not in place; at the 08/16/2020 visit the patient was given the appropriate documents to complete and notarized at her discretion.   HEALTH MAINTENANCE: Social History   Tobacco Use  . Smoking status: Former Smoker    Packs/day: 0.50    Years: 5.00    Pack years: 2.50    Types: Cigarettes    Start date: 08/06/2006    Quit date: 08/07/2011    Years since quitting: 9.1  . Smokeless  tobacco: Never Used  Vaping Use  . Vaping Use: Never used  Substance Use Topics  . Alcohol use: Yes    Comment: occasionally  . Drug use: No     Colonoscopy: 2002 for perforated bowel  PAP: 03/2020  Bone density: done at Dr. [facility] office   Allergies  Allergen Reactions  . Beef-Derived Products Hives  . Latex Rash    Contact  . Sulfa Antibiotics Rash    Current Outpatient Medications  Medication Sig Dispense Refill  . acetaminophen (TYLENOL) 500 MG tablet Take 1,000 mg by mouth every 6 (six) hours as needed for moderate pain or headache.    . amphetamine-dextroamphetamine (ADDERALL XR) 20 MG 24 hr capsule Take 1 capsule (20 mg total) by mouth daily. 30 capsule 0  . Ascorbic Acid (VITAMIN C PO) Take 3,000 mg by mouth daily.     Marland Kitchen CAMILA 0.35 MG tablet Take 1 tablet by mouth daily.  11  . Cholecalciferol (VITAMIN D3) 75 MCG (3000 UT) TABS Take 3,000 Units by mouth daily.    Marland Kitchen dexamethasone (DECADRON) 4 MG tablet Take 2 tablets (8 mg  total) by mouth 2 (two) times daily. Start the day before Taxotere. Then take daily x 3 days after chemotherapy. (Patient taking differently: Take 8 mg by mouth See admin instructions. Take 8 mg by mouth twice daily. Start the day before Taxotere. Then take daily x 3 days after chemotherapy.) 30 tablet 1  . Docusate Sodium (COLACE PO) Take 2 tablets by mouth 2 (two) times daily.    Marland Kitchen enoxaparin (LOVENOX) 30 MG/0.3ML injection Inject 0.3 mLs (30 mg total) into the skin daily. (Patient not taking: Reported on 09/18/2020) 6 mL 1  . fluconazole (DIFLUCAN) 100 MG tablet Take as directed (Patient not taking: Reported on 09/18/2020) 30 tablet 0  . lidocaine-prilocaine (EMLA) cream Apply to affected area once (Patient taking differently: Apply 1 application topically daily as needed (port access). ) 30 g 3  . loratadine (CLARITIN) 10 MG tablet Take 10 mg by mouth See admin instructions. Take 10 mg daily for 5 days after chemo treatments    . LORazepam (ATIVAN) 0.5  MG tablet Take 1 tablet (0.5 mg total) by mouth at bedtime as needed (Nausea or vomiting). 30 tablet 0  . magic mouthwash SOLN Take 5 mLs by mouth 4 (four) times daily as needed for mouth pain. (Patient not taking: Reported on 09/18/2020) 240 mL 0  . metoCLOPramide (REGLAN) 10 MG tablet Take 1 tablet (10 mg total) by mouth every 6 (six) hours as needed for nausea. 40 tablet 2  . metroNIDAZOLE (METROGEL) 1 % gel Apply topically daily. 45 g 0  . oxyCODONE (OXY IR/ROXICODONE) 5 MG immediate release tablet Take 1 tablet (5 mg total) by mouth every 6 (six) hours as needed. (Patient not taking: Reported on 09/18/2020) 6 tablet 0  . Pegfilgrastim (NEULASTA [place]) Inject 1 Dose into the skin See admin instructions. Inject 24 to 72 hours after chemo treatment    . polyethylene glycol (MIRALAX / GLYCOLAX) 17 g packet Take 17 g by mouth daily.    . prochlorperazine (COMPAZINE) 10 MG tablet Take 0.5-1 tablets (5-10 mg total) by mouth 4 (four) times daily -  before meals and at bedtime. Start the evening of chemotherapy and take for 2 days, then take as needed (Patient not taking: Reported on 09/18/2020) 30 tablet 1  . Rivaroxaban (XARELTO) 15 MG TABS tablet Take 1 tablet (15 mg total) by mouth daily. 30 tablet 3  . spironolactone (ALDACTONE) 25 MG tablet Take 25 mg by mouth daily as needed (swelling).    . traMADol (ULTRAM) 50 MG tablet Take 1-2 tablets (50-100 mg total) by mouth every 6 (six) hours as needed. (Patient taking differently: Take 50-100 mg by mouth every 6 (six) hours as needed for moderate pain. ) 60 tablet 0  . tretinoin (RETIN-A) 0.1 % cream Apply 1 application topically at bedtime as needed (acne).     Marland Kitchen UNABLE TO FIND Green Vibrance - 1 scoop daily    . warfarin (COUMADIN) 5 MG tablet Take 1 tablet (5 mg total) by mouth daily. 30 tablet 11   No current facility-administered medications for this visit.    OBJECTIVE: White woman who appears younger than stated age  54:   09/25/20 1221  BP:  111/65  Pulse: 73  Resp: 18  Temp: 97.7 F (36.5 C)  SpO2: 100%     Body mass index is 19.71 kg/m.   Wt Readings from Last 3 Encounters:  09/25/20 114 lb 12.8 oz (52.1 kg)  09/21/20 113 lb 1.6 oz (51.3 kg)  09/20/20 110  lb (49.9 kg)      ECOG FS:2 - Symptomatic, <50% confined to bed  Sclerae unicteric, EOMs intact Wearing a mask No cervical or supraclavicular adenopathy Lungs no rales or rhonchi Heart regular rate and rhythm Abd soft, nontender, positive bowel sounds MSK no focal spinal tenderness, minimal edema bilateral ankles Neuro: nonfocal, well oriented, appropriate affect Breasts: The breasts are difficult to examine but the mass in the upper right breast appears to me to be diminished.   LAB RESULTS:  CMP     Component Value Date/Time   NA 136 09/21/2020 0827   NA 137 07/28/2020 1108   K 3.9 09/21/2020 0827   CL 104 09/21/2020 0827   CO2 25 09/21/2020 0827   GLUCOSE 133 (H) 09/21/2020 0827   BUN 7 09/21/2020 0827   BUN 8 07/28/2020 1108   CREATININE 0.68 09/21/2020 0827   CALCIUM 8.9 09/21/2020 0827   PROT 6.4 (L) 09/21/2020 0827   PROT 7.3 07/28/2020 1108   ALBUMIN 3.4 (L) 09/21/2020 0827   ALBUMIN 4.9 07/28/2020 1108   AST 19 09/21/2020 0827   ALT 27 09/21/2020 0827   ALKPHOS 72 09/21/2020 0827   BILITOT 0.4 09/21/2020 0827   GFRNONAA >60 09/21/2020 0827   GFRAA 95 07/28/2020 1108    No results found for: TOTALPROTELP, ALBUMINELP, A1GS, A2GS, BETS, BETA2SER, GAMS, MSPIKE, SPEI  Lab Results  Component Value Date   WBC 6.9 09/21/2020   NEUTROABS 5.9 09/21/2020   HGB 10.4 (L) 09/21/2020   HCT 31.1 (L) 09/21/2020   MCV 92.3 09/21/2020   PLT 360 09/21/2020    No results found for: LABCA2  No components found for: [id number]  Recent Labs  Lab 09/20/20 0625  INR 1.1    No results found for: LABCA2  No results found for: FGB021  No results found for: JDB520  No results found for: EYE233  No results found for: CA2729  No components  found for: HGQUANT  No results found for: CEA1 / No results found for: CEA1   No results found for: AFPTUMOR  No results found for: CHROMOGRNA  No results found for: KPAFRELGTCHN, LAMBDASER, KAPLAMBRATIO (kappa/lambda light chains)  No results found for: HGBA, HGBA2QUANT, HGBFQUANT, HGBSQUAN (Hemoglobinopathy evaluation)   No results found for: LDH  Lab Results  Component Value Date   IRON 88 10/29/2018   IRON 88 10/29/2018   TIBC 241 (L) 10/29/2018   TIBC 241 (L) 10/29/2018   IRONPCTSAT 37 10/29/2018   IRONPCTSAT 37 10/29/2018   (Iron and TIBC)  Lab Results  Component Value Date   FERRITIN 43 10/29/2018   FERRITIN 43 10/29/2018    Urinalysis    Component Value Date/Time   APPEARANCEUR Clear 08/08/2020 1359   GLUCOSEU Negative 08/08/2020 1359   BILIRUBINUR Negative 08/08/2020 1359   PROTEINUR Negative 08/08/2020 1359   NITRITE Negative 08/08/2020 1359   LEUKOCYTESUR Negative 08/08/2020 1359     STUDIES: CT HEAD W & WO CONTRAST  Result Date: 09/14/2020 CLINICAL DATA:  Breast cancer, staging. EXAM: CT HEAD WITHOUT AND WITH CONTRAST TECHNIQUE: Contiguous axial images were obtained from the base of the skull through the vertex without and with intravenous contrast CONTRAST:  53m OMNIPAQUE IOHEXOL 300 MG/ML  SOLN COMPARISON:  None. FINDINGS: Brain: No evidence of acute infarction, hemorrhage, hydrocephalus, extra-axial collection or mass lesion/mass effect. No focus of abnormal contrast enhancement. Vascular: No hyperdense vessel or unexpected calcification. Visible vessels are patent. Skull: Normal. Negative for fracture or focal lesion. Sinuses/Orbits: No acute finding.  IMPRESSION: No evidence of metastatic disease in the head. Please note that head CT has low sensitivity for small metastatic lesion. In case of high clinical suspicion, recommend MRI of the brain with contrast. Electronically Signed   By: Pedro Earls M.D.   On: 09/14/2020 18:49   CT  Chest W Contrast  Result Date: 09/14/2020 CLINICAL DATA:  New diagnosis of right breast cancer. Staging. Chemotherapy initiated 08/31/2020. EXAM: CT CHEST WITH CONTRAST TECHNIQUE: Multidetector CT imaging of the chest was performed during intravenous contrast administration. CONTRAST:  39m OMNIPAQUE IOHEXOL 300 MG/ML  SOLN COMPARISON:  None. FINDINGS: Cardiovascular: Normal heart size. No significant pericardial effusion/thickening. Minimally atherosclerotic nonaneurysmal thoracic aorta. Normal caliber pulmonary arteries. No central pulmonary emboli. Right internal jugular Port-A-Cath terminates in the lower third of the SVC. Subcutaneous emphysema at the port site compatible with recent placement. Mediastinum/Nodes: No discrete thyroid nodules. Unremarkable esophagus. No pathologically enlarged axillary, mediastinal or hilar lymph nodes. Lungs/Pleura: No pneumothorax. No pleural effusion. Coarsely calcified subcentimeter right upper lobe granuloma. Anterior left upper lobe 0.8 cm pulmonary nodule with coarse internal calcifications, also compatible with a granuloma. No acute consolidative airspace disease, lung masses or additional significant pulmonary nodules. Upper abdomen: No acute abnormality. Musculoskeletal:  No aggressive appearing focal osseous lesions. IMPRESSION: 1. No evidence of metastatic disease in the chest. 2. Aortic Atherosclerosis (ICD10-I70.0). Electronically Signed   By: JIlona SorrelM.D.   On: 09/14/2020 15:12   NM Bone Scan Whole Body  Result Date: 09/14/2020 CLINICAL DATA:  Breast cancer, new diagnosis EXAM: NUCLEAR MEDICINE WHOLE BODY BONE SCAN TECHNIQUE: Whole body anterior and posterior images were obtained approximately 3 hours after intravenous injection of radiopharmaceutical. RADIOPHARMACEUTICALS:  20.5 mCi Technetium-922mDP IV COMPARISON:  None Correlation: CT chest 09/14/2020 FINDINGS: Minimal uptake of tracer at the [facility] and sternoclavicular joints, as well as RIGHT  knee likely degenerative. Mildly inhomogeneous tracer uptake in the posterior ribs bilaterally, without sclerosis or fractures on CT, may represent artifact. No definite sites of abnormal tracer uptake are seen to suggest osseous metastases. Expected urinary tract and soft tissue distribution of tracer. IMPRESSION: No definite scintigraphic evidence of osseous metastatic disease as above. Electronically Signed   By: MaLavonia Dana.D.   On: 09/14/2020 17:39   USKoreaIOPSY (LIVER)  Result Date: 09/20/2020 INDICATION: History of right breast carcinoma with probable metastatic lesions in the liver including a 2.5 cm mass in the caudate lobe of the liver. EXAM: ULTRASOUND GUIDED CORE BIOPSY OF LIVER MASS MEDICATIONS: None. ANESTHESIA/SEDATION: Fentanyl 75 mcg IV; Versed 1.5 mg IV Moderate Sedation Time:  31 minutes. The patient was continuously monitored during the procedure by the interventional radiology nurse under my direct supervision. PROCEDURE: The procedure, risks, benefits, and alternatives were explained to the patient. Questions regarding the procedure were encouraged and answered. The patient understands and consents to the procedure. A time-out was performed prior to initiating the procedure. Ultrasound was performed of the liver with various probes to localize lesions. The abdominal wall was prepped with chlorhexidine in a sterile fashion, and a sterile drape was applied covering the operative field. A sterile gown and sterile gloves were used for the procedure. Local anesthesia was provided with 1% Lidocaine. Under ultrasound guidance, a 1774auge trocar needle was advanced to the level of a lesion within the caudate lobe of the liver near its junction with the right lobe and just anterior to the IVC. After confirming needle tip position, 3 separate coaxial 18 gauge core biopsy  samples were obtained and submitted in formalin. A slurry of Gel-Foam pledgets was injected via the 17 gauge needle as the needle  was retracted on completion. COMPLICATIONS: None immediate. FINDINGS: The only lesion visible in the liver by ultrasound is the roughly 2.5 cm caudate mass which is vaguely marginated but localized by ultrasound in the exact location seen by prior ultrasound and by MRI. Solid tissue samples were obtained. IMPRESSION: Ultrasound-guided core biopsy performed at the level of a 2.5 cm mass in the caudate lobe of the liver. Electronically Signed   By: Aletta Edouard M.D.   On: 09/20/2020 11:19   DG Abd 2 Views  Result Date: 09/04/2020 CLINICAL DATA:  Constipation, diffuse abdominal pain, breast cancer. EXAM: ABDOMEN - 2 VIEW COMPARISON:  None. FINDINGS: Fair amount of stool is seen in the colon. No small bowel dilatation. No unexpected radiopaque calculi. IMPRESSION: Fair amount of stool in the colon is indicative of constipation. Electronically Signed   By: Lorin Picket M.D.   On: 09/04/2020 10:53   DG Fluoro Guide CV Line Right  Result Date: 09/13/2020 CLINICAL DATA:  54 year old female undergoing placement of a surgical port catheter. EXAM: CHEST FLUOROSCOPY TECHNIQUE: Real-time fluoroscopic evaluation of the chest was performed. FLUOROSCOPY TIME:  Fluoroscopy Time:  0 minutes 20 seconds Radiation Exposure Index (if provided by the fluoroscopic device): 0.88 mGy COMPARISON:  None. FINDINGS: A single saved fluoro images submitted for review. The image demonstrates a right IJ approach single-lumen power injectable port catheter. The catheter tip overlies the superior cavoatrial junction. No evidence of pneumothorax. IMPRESSION: Right IJ approach single-lumen power injectable port catheter. The catheter tip overlies the superior cavoatrial junction. Electronically Signed   By: Jacqulynn Cadet M.D.   On: 09/13/2020 16:15   MM CLIP PLACEMENT RIGHT  Result Date: 09/05/2020 CLINICAL DATA:  Status post MRI guided biopsy today of a suspicious mass within the upper-outer quadrant the RIGHT breast. Patient  had an earlier ultrasound-guided biopsy confirming malignancy within the RIGHT breast at the 6 o'clock axis. An additional earlier stereotactic biopsy of the posterior RIGHT breast revealed a benign pathology result. EXAM: DIAGNOSTIC RIGHT MAMMOGRAM POST MRI BIOPSY COMPARISON:  Previous exam(s). FINDINGS: Mammographic images were obtained following MRI guided biopsy of the suspicious mass within the upper-outer quadrant of the RIGHT breast. The biopsy marking clip is approximately 5 mm inferomedial to the biopsy site. IMPRESSION: 1. Today's BARBELL shaped clip is adequately positioned approximately 5 mm inferomedial to the biopsy cavity. 2. A ribbon shaped clip corresponds to a biopsy-proven cancer within the RIGHT breast at the 6 o'clock axis. 3. A coil shaped clip within the posterior RIGHT breast corresponds to a previous benign stereotactic biopsy. Final Assessment: Post Procedure Mammograms for Marker Placement Electronically Signed   By: Franki Cabot M.D.   On: 09/05/2020 09:12   MR RT BREAST BX W LOC DEV 1ST LESION IMAGE BX SPEC MR GUIDE  Addendum Date: 09/07/2020   ADDENDUM REPORT: 09/07/2020 14:20 ADDENDUM: Pathology revealed SCLEROTIC LESION WITH FOCAL LOBULAR NEOPLASIA (ATYPICAL LOBULAR HYPERPLASIA) od the Right breast, upper outer quadrant. The differential diagnosis includes a sclerotic fibroadenoma and sclerotic papilloma. This was found to be concordant by Dr. Franki Cabot, with excision recommended. Pathology results were discussed with the patient by telephone. The patient reported doing well after the biopsy with tenderness at the site. Post biopsy instructions and care were reviewed and questions were answered. The patient was encouraged to call [facility] for any additional concerns.  My direct phone number was provided. The patient has a recent diagnosis of Right breast cancer and should follow her outlined treatment plan. Dr. Rolm Bookbinder was notified of  biopsy results via EPIC message on September 07, 2020. There are 2 additional suspicious masses within the inner Right breast. If breast conservation is still considered after this biopsy, the patient will need to be scheduled for the 2 additional masses in the inner Right breast. Per my conversation with Dr. Donne Hazel today, additional MRI-guided biopsies will not be requested/ordered at this time. Pathology results reported by Terie Purser, RN on 09/07/2020. Electronically Signed   By: Franki Cabot M.D.   On: 09/07/2020 14:20   Result Date: 09/07/2020 CLINICAL DATA:  Patient with a known biopsy-proven RIGHT breast cancer at the 6 o'clock axis. Subsequent breast MRI revealing 3 additional suspicious masses within the RIGHT breast, 2 of which are located within the inner RIGHT breast and the third mass within the upper-outer quadrant. This mass within the upper-outer quadrant is farthest from the known cancer so will be biopsied today. EXAM: MRI GUIDED CORE NEEDLE BIOPSY OF THE RIGHT BREAST TECHNIQUE: Multiplanar, multisequence MR imaging of the RIGHT breast was performed both before and after administration of intravenous contrast. CONTRAST:  75m GADAVIST GADOBUTROL 1 MMOL/ML IV SOLN COMPARISON:  Previous exams. FINDINGS: I met with the patient, and we discussed the procedure of MRI guided biopsy, including risks, benefits, and alternatives. Specifically, we discussed the risks of infection, bleeding, tissue injury, clip migration, and inadequate sampling. Informed, written consent was given. The usual time out protocol was performed immediately prior to the procedure. Using sterile technique, 1% Lidocaine, MRI guidance, and a 9 gauge vacuum assisted device, biopsy was performed of the suspicious mass within the upper-outer quadrant of the RIGHT breast using a lateral approach. At the conclusion of the procedure, a barbell shaped tissue marker clip was deployed into the biopsy cavity. Follow-up 2-view mammogram  was performed and dictated separately. IMPRESSION: MRI guided biopsy of the suspicious mass within the upper-outer quadrant of the RIGHT breast. No apparent complications. Electronically Signed: By: SFranki CabotM.D. On: 09/05/2020 09:05   [place]Abdomen Limited RUQ (LIVER/GB)  Result Date: 09/04/2020 CLINICAL DATA:  History of breast cancer. EXAM: ULTRASOUND ABDOMEN LIMITED RIGHT UPPER QUADRANT COMPARISON:  August 24, 2020.  July 14, 2020. FINDINGS: Gallbladder: Probable 6 mm calculus is noted. No gallbladder wall thickening or pericholecystic fluid is noted. No sonographic Murphy's sign is noted. Common bile duct: Diameter: 5 mm which is within normal limits. Liver: 2.6 cm solid lesion is seen in the region of the caudate lobe which corresponds to metastatic lesion seen on prior MRI. The other metastatic lesions noted on prior MRI are not well visualized on this study. Within normal limits in parenchymal echogenicity. Portal vein is patent on color Doppler imaging with normal direction of blood flow towards the liver. Other: None. IMPRESSION: 2.6 cm solid lesion seen in the caudate lobe of the liver consistent with metastatic disease as described on prior MRI. Cholelithiasis is noted without evidence of cholecystitis. Electronically Signed   By: JMarijo ConceptionM.D.   On: 09/04/2020 09:20     ELIGIBLE FOR AVAILABLE RESEARCH PROTOCOL: AET  ASSESSMENT: 54y.o. [place] woman status post right breast lower outer quadrant biopsy 08/04/2020 for a clinical T2 N0 M1, stage IV invasive ductal carcinoma, grade 3, triple positive, with an MIB-1 of 40%  (a) breast MRI 08/14/2020 shows 3 additional right breast lesions and heterogeneous  enhancement of the sternum  (b) biopsy of the additional right breast masses  (c) CT scan of the abdomen and pelvis 07/14/2020 shows 2 indeterminate liver lesions  (d) abdominal MRI 08/24/2020 shows multiple liver lesions highly suspicious for liver metastases.  (e) liver  biopsy 09/20/2020 positive for carcinoma, prognostic panel  (f) bone scan 09/14/2020 shows no evidence of bone metastases  (g) CT scan of the chest and head 09/14/2020 showed no evidence of metastatic disease   (1) neoadjuvant chemotherapy to consist of docetaxel, carboplatin, trastuzumab and pertuzumab every 21 days x 6 started 08/31/2020  (2) trastuzumab to continue to complete a year (through October 2022).  (a) echo 04/12/2020 shows an ejection fraction in the 60-65% range  (3) definitive surgery to follow  (4) adjuvant radiation as appropriate  (5) antiestrogens to start at the completion of local treatment   PLAN: Siddhi did much better with the second cycle of chemotherapy.  I think having the Plaquenil on board is what made it so difficult for her previously.  She does not know if she really got any benefit from the fluids she received on day 3.  Recall she gives herself the shots at home so she really does not have to return that day for any other reason.  She thinks she might benefit from the fluid on day 7 or so and will going to change it to that   She has gained 4 pounds.  Some of that is going to be fluid weight.  I do not think she needs a water pill at this point but I have asked her to keep an eye on her weight and if she finds the weight continues to go up over the next several days we will start [place].  Otherwise she will return to see me 10/12/2020 with cycle #3.  She knows to call for any other issue that may develop before then  Total encounter time 25 minutes.Sarajane Jews C. Dagmar Adcox, MD 09/25/2020 12:31 PM Medical Oncology and Hematology [facility] [place], [place] [address] Tel. [phone]    Fax. [phone]   This document serves as a record of services personally performed by Lurline Del, MD. It was created on his behalf by Wilburn Mylar, a trained medical scribe. The creation of this record is based on the  scribe's personal observations and the provider's statements to them.   I, Lurline Del MD, have reviewed the above documentation for accuracy and completeness, and I agree with the above.   *Total Encounter Time as defined by the Centers for Medicare and Medicaid Services includes, in addition to the face-to-face time of a patient visit (documented in the note above) non-face-to-face time: obtaining and reviewing outside history, ordering and reviewing medications, tests or procedures, care coordination (communications with other health care professionals or caregivers) and documentation in the medical record.

## 2020-09-25 NOTE — Progress Notes (Signed)
  Echocardiogram 2D Echocardiogram has been performed.  Jannett Celestine 09/25/2020, 10:01 AM

## 2020-09-26 LAB — ECHOCARDIOGRAM COMPLETE
Area-P 1/2: 2.99 cm2
S' Lateral: 2.8 cm

## 2020-09-28 ENCOUNTER — Telehealth: Payer: Self-pay | Admitting: *Deleted

## 2020-09-28 ENCOUNTER — Other Ambulatory Visit: Payer: Self-pay | Admitting: Oncology

## 2020-09-28 MED ORDER — TRIAMTERENE-HCTZ 37.5-25 MG PO CAPS: ORAL_CAPSULE | ORAL | 0 refills | Status: DC

## 2020-09-28 NOTE — Telephone Encounter (Signed)
Erica Wood left a message stating her left leg is still swollen from knee up and ankle. I went for a walk and both legs were hurting.  Dr Jana Hakim wants her to take Dyazide daily as needed for swelling

## 2020-10-02 ENCOUNTER — Telehealth: Payer: Self-pay

## 2020-10-02 NOTE — Telephone Encounter (Signed)
Pt called stating she removed bandaid from liver bx site and states she has a "painful blister with redness surrounding." This LPN advised pt to cover with gauze and paper tape and offered her an appt with [facility]. Pt accepted appt for 0900 with [facility] and knows to arrive 15 minutes prior for check in.

## 2020-10-03 ENCOUNTER — Inpatient Hospital Stay (HOSPITAL_BASED_OUTPATIENT_CLINIC_OR_DEPARTMENT_OTHER): Payer: 59 | Admitting: Medical

## 2020-10-03 ENCOUNTER — Other Ambulatory Visit: Payer: Self-pay

## 2020-10-03 VITALS — BP 110/65 | HR 79 | Temp 98.6°F | Resp 17 | Ht 64.0 in | Wt 111.2 lb

## 2020-10-03 DIAGNOSIS — Z170 Estrogen receptor positive status [ER+]: Secondary | ICD-10-CM

## 2020-10-03 DIAGNOSIS — Z5112 Encounter for antineoplastic immunotherapy: Secondary | ICD-10-CM | POA: Diagnosis not present

## 2020-10-03 DIAGNOSIS — C50511 Malignant neoplasm of lower-outer quadrant of right female breast: Secondary | ICD-10-CM

## 2020-10-03 DIAGNOSIS — R1012 Left upper quadrant pain: Secondary | ICD-10-CM

## 2020-10-04 ENCOUNTER — Other Ambulatory Visit: Payer: Self-pay | Admitting: *Deleted

## 2020-10-04 ENCOUNTER — Telehealth: Payer: Self-pay | Admitting: *Deleted

## 2020-10-04 MED ORDER — HYDROXYCHLOROQUINE SULFATE 200 MG PO TABS: ORAL_TABLET | ORAL | 0 refills | Status: DC

## 2020-10-04 NOTE — Telephone Encounter (Signed)
Last Visit: 07/27/2020 Next Visit: 12/28/2020 Labs: 09/21/2020 RBC 3.37, Hgb 10.4, Hct 31.1, Glucose 133, Total Protein 6.4, Albumin 3.4 PLQ Eye Exam: 02/2020   Current Dose per office note on 07/27/2020: PLQ 200 mg BID M-F.  Dx: Autoimmune disease   Okay to refill PLQ?

## 2020-10-04 NOTE — Telephone Encounter (Signed)
[employer] disablity information received via fax from Medical Records- request placed in FMLA box.

## 2020-10-06 ENCOUNTER — Telehealth: Payer: Self-pay | Admitting: *Deleted

## 2020-10-06 NOTE — Telephone Encounter (Signed)
Awaiting return call from patient.   Returned call regarding message of new form and correction needed to previous form.  Erica Wood reports payroll missing hours with information provided to prior [employer] form. Received form today requesting work capacity 08/31/2020 through 09/03/2020.

## 2020-10-06 NOTE — Progress Notes (Signed)
[facility] Progress Note   Erica Wood [id number] [birth year] 54 y.o.  Erica Wood is managed by Dr. Jana Hakim  Actively treated with chemotherapy/immunotherapy/hormonal therapy: yes  Current therapy: Carboplatin and docetaxel  Last treated: 09/21/2020 (cycle 2, day 1)  Next scheduled appointment with provider: 10/12/2020  Assessment: Plan:    Malignant neoplasm of lower-outer quadrant of right breast of female, estrogen receptor positive ([place])  Left upper quadrant abdominal pain   ER positive metastatic malignant neoplasm of the right breast with liver metastasis: Ms. Erica Wood continues to be managed by Dr. Jana Hakim and is status post cycle 2, day 1 of carboplatin and docetaxel which was dosed on 09/21/2020.  She is scheduled to be seen next on 10/12/2020.  Left upper quadrant abdominal pain close to midline: The patient continues to have pain at the site of a recent liver biopsy.  There are 2 small areas of a rash that appear to be consistent with recently applied adhesive tape.  Interventional radiology was contacted.  They state that the patient's pain could represent an area of a hematoma.  She was told to use pain medications which she has at home apply warm compress to the area.  Should her pain continue into next week then he would be referred for a soft tissue ultrasound or a CT scan.  Please see After Visit Summary for patient specific instructions.  Future Appointments  Date Time Provider [place]  10/12/2020  8:15 AM [facility] FLUSH [facility] None  10/12/2020  8:30 AM Magrinat, Virgie Dad, MD [facility] None  10/12/2020  9:00 AM [facility] INFUSION [facility] None  10/17/2020  8:30 AM [facility] INFUSION [facility] None  11/02/2020  8:45 AM [facility] LAB [facility] None  11/02/2020  9:00 AM [facility] [place] FLUSH [facility] None  11/02/2020  9:30 AM Magrinat, Virgie Dad, MD [facility] None  11/02/2020 10:00 AM [facility]  INFUSION [facility] None  12/28/2020  1:15 PM Deveshwar, Abel Presto, MD [facility] None    No orders of the defined types were placed in this encounter.      Subjective:   Patient ID:  Erica Wood is a 54 y.o. (DOB 04/28/1966) female.  Chief Complaint: No chief complaint on file.   HPI [name]  is a 54 y.o. female with a diagnosis of an ER positive metastatic malignant neoplasm of the right breast with liver metastasis.  She is followed by Dr. Jana Hakim and is status post cycle 2, day 1 of carboplatin and docetaxel which was dosed on 09/21/2020.  She is status post a liver biopsy completed on 09/20/2020.  She has had pain at the site of her biopsy since that time.  There are also 2 scaling lesions on each side of the biopsy site.  She denies fevers, chills, sweats, nausea, vomiting, constipation, or diarrhea.  Medications: I have reviewed the patient's current medications.  Allergies:  Allergies  Allergen Reactions  . Beef-Derived Products Hives  . Latex Rash    Contact  . Sulfa Antibiotics Rash    Past Medical History:  Diagnosis Date  . Anti-cardiolipin antibody positive   . Anti-cardiolipin antibody syndrome (HCC)   . Anxiety   . Arthralgia   . Arthritis    bil knees, hands  . Atypical chest pain   . Autoimmune disease ([place])   . Cancer ([place]) 07/2020   right breast IDC  . Chronic fatigue   . Depression   . DVT (deep venous thrombosis) ([place])   . IBS (irritable bowel  syndrome)    with constipation  . Lupus ([place])   . Pulmonary embolus ([place])   . Raynaud's syndrome   . Sicca ([place])   . Thrombocytopenia ([place])     Past Surgical History:  Procedure Laterality Date  . ABLATION  12/2018   leg veins  . ABLATION Right 08/2019   venous leg   . APPENDECTOMY    . COLON SURGERY     Perforated colon repair after colostomy  . GANGLION CYST EXCISION    . HYSTEROSCOPY WITH D & C N/A 01/13/2014   Procedure: DILATATION AND CURETTAGE /HYSTEROSCOPY with resection ;   Surgeon: Olga Millers, MD;  Location: [facility];  Service: Gynecology;  Laterality: N/A;  . LAPAROTOMY    . PORTACATH PLACEMENT N/A 09/13/2020   Procedure: INSERTION PORT-A-CATH WITH ULTRASOUND GUIDANCE;  Surgeon: Rolm Bookbinder, MD;  Location: [facility];  Service: General;  Laterality: N/A;    Family History  Problem Relation Age of Onset  . Rheum arthritis Father   . Hemachromatosis Father   . Cancer Sister        bone     Social History   Socioeconomic History  . Marital status: Widowed    Spouse name: Not on file  . Number of children: Not on file  . Years of education: Not on file  . Highest education level: Not on file  Occupational History  . Not on file  Tobacco Use  . Smoking status: Former Smoker    Packs/day: 0.50    Years: 5.00    Pack years: 2.50    Types: Cigarettes    Start date: 08/06/2006    Quit date: 08/07/2011    Years since quitting: 9.1  . Smokeless tobacco: Never Used  Vaping Use  . Vaping Use: Never used  Substance and Sexual Activity  . Alcohol use: Yes    Comment: occasionally  . Drug use: No  . Sexual activity: Yes    Birth control/protection: Pill  Other Topics Concern  . Not on file  Social History Narrative  . Not on file   Social Determinants of Health   Financial Resource Strain:   . Difficulty of Paying Living Expenses: Not on file  Food Insecurity:   . Worried About Charity fundraiser in the Last Year: Not on file  . Ran Out of Food in the Last Year: Not on file  Transportation Needs:   . Lack of Transportation (Medical): Not on file  . Lack of Transportation (Non-Medical): Not on file  Physical Activity:   . Days of Exercise per Week: Not on file  . Minutes of Exercise per Session: Not on file  Stress:   . Feeling of Stress : Not on file  Social Connections:   . Frequency of Communication with Friends and Family: Not on file  . Frequency of Social Gatherings with Friends and Family: Not on file  .  Attends Religious Services: Not on file  . Active Member of Clubs or Organizations: Not on file  . Attends Archivist Meetings: Not on file  . Marital Status: Not on file  Intimate Partner Violence:   . Fear of Current or Ex-Partner: Not on file  . Emotionally Abused: Not on file  . Physically Abused: Not on file  . Sexually Abused: Not on file    Past Medical History, Surgical history, Social history, and Family history were reviewed and updated as appropriate.   Please see review of systems  for further details on the patient's review from today.   Review of Systems:  Review of Systems  Constitutional: Negative for activity change, appetite change, chills, diaphoresis and fever.  Respiratory: Negative for cough, chest tightness and shortness of breath.   Cardiovascular: Negative for chest pain, palpitations and leg swelling.  Gastrointestinal: Positive for abdominal pain. Negative for abdominal distention, constipation, diarrhea, nausea and vomiting.  Skin: Positive for rash.    Objective:   Physical Exam:  BP 110/65 (BP Location: Left Arm, Patient Position: Sitting)   Pulse 79   Temp 98.6 F (37 C) (Tympanic)   Resp 17   Ht 5\' 4"  (1.626 m)   Wt 111 lb 3.2 oz (50.4 kg)   SpO2 100%   BMI 19.09 kg/m  ECOG: 0  Physical Exam Constitutional:      General: She is not in acute distress.    Appearance: She is not diaphoretic.  HENT:     Head: Normocephalic and atraumatic.     Mouth/Throat:     Pharynx: No oropharyngeal exudate.  Cardiovascular:     Rate and Rhythm: Normal rate and regular rhythm.     Heart sounds: Normal heart sounds. No murmur heard.  No friction rub. No gallop.   Pulmonary:     Effort: Pulmonary effort is normal. No respiratory distress.     Breath sounds: Normal breath sounds. No wheezing or rales.  Abdominal:     General: Bowel sounds are normal. There is no distension.     Palpations: Abdomen is soft. There is no mass.     Tenderness:  There is abdominal tenderness. There is no guarding or rebound.    Musculoskeletal:     Cervical back: Normal range of motion and neck supple.  Lymphadenopathy:     Cervical: No cervical adenopathy.  Skin:    General: Skin is warm and dry.     Findings: No erythema or rash.  Neurological:     Mental Status: She is alert.     Coordination: Coordination normal.  Psychiatric:        Behavior: Behavior normal.        Thought Content: Thought content normal.        Judgment: Judgment normal.     Lab Review:     Component Value Date/Time   NA 136 09/21/2020 0827   NA 137 07/28/2020 1108   K 3.9 09/21/2020 0827   CL 104 09/21/2020 0827   CO2 25 09/21/2020 0827   GLUCOSE 133 (H) 09/21/2020 0827   BUN 7 09/21/2020 0827   BUN 8 07/28/2020 1108   CREATININE 0.68 09/21/2020 0827   CALCIUM 8.9 09/21/2020 0827   PROT 6.4 (L) 09/21/2020 0827   PROT 7.3 07/28/2020 1108   ALBUMIN 3.4 (L) 09/21/2020 0827   ALBUMIN 4.9 07/28/2020 1108   AST 19 09/21/2020 0827   ALT 27 09/21/2020 0827   ALKPHOS 72 09/21/2020 0827   BILITOT 0.4 09/21/2020 0827   GFRNONAA >60 09/21/2020 0827   GFRAA 95 07/28/2020 1108       Component Value Date/Time   WBC 6.9 09/21/2020 0827   WBC 7.1 09/20/2020 0625   RBC 3.37 (L) 09/21/2020 0827   HGB 10.4 (L) 09/21/2020 0827   HGB 14.3 07/28/2020 1108   HCT 31.1 (L) 09/21/2020 0827   HCT 41.4 07/28/2020 1108   PLT 360 09/21/2020 0827   PLT 202 07/28/2020 1108   MCV 92.3 09/21/2020 0827   MCV 89 07/28/2020 1108   MCH  30.9 09/21/2020 0827   MCHC 33.4 09/21/2020 0827   RDW 13.2 09/21/2020 0827   RDW 10.6 (L) 07/28/2020 1108   LYMPHSABS 0.8 09/21/2020 0827   LYMPHSABS 1.9 07/28/2020 1108   MONOABS 0.2 09/21/2020 0827   EOSABS 0.0 09/21/2020 0827   EOSABS 0.0 07/28/2020 1108   BASOSABS 0.0 09/21/2020 0827   BASOSABS 0.1 07/28/2020 1108   -------------------------------  Imaging from last 24 hours (if applicable):  Radiology interpretation: CT HEAD W  & WO CONTRAST  Result Date: 09/14/2020 CLINICAL DATA:  Breast cancer, staging. EXAM: CT HEAD WITHOUT AND WITH CONTRAST TECHNIQUE: Contiguous axial images were obtained from the base of the skull through the vertex without and with intravenous contrast CONTRAST:  50mL OMNIPAQUE IOHEXOL 300 MG/ML  SOLN COMPARISON:  None. FINDINGS: Brain: No evidence of acute infarction, hemorrhage, hydrocephalus, extra-axial collection or mass lesion/mass effect. No focus of abnormal contrast enhancement. Vascular: No hyperdense vessel or unexpected calcification. Visible vessels are patent. Skull: Normal. Negative for fracture or focal lesion. Sinuses/Orbits: No acute finding. IMPRESSION: No evidence of metastatic disease in the head. Please note that head CT has low sensitivity for small metastatic lesion. In case of high clinical suspicion, recommend MRI of the brain with contrast. Electronically Signed   By: Pedro Earls M.D.   On: 09/14/2020 18:49   CT Chest W Contrast  Result Date: 09/14/2020 CLINICAL DATA:  New diagnosis of right breast cancer. Staging. Chemotherapy initiated 08/31/2020. EXAM: CT CHEST WITH CONTRAST TECHNIQUE: Multidetector CT imaging of the chest was performed during intravenous contrast administration. CONTRAST:  78mL OMNIPAQUE IOHEXOL 300 MG/ML  SOLN COMPARISON:  None. FINDINGS: Cardiovascular: Normal heart size. No significant pericardial effusion/thickening. Minimally atherosclerotic nonaneurysmal thoracic aorta. Normal caliber pulmonary arteries. No central pulmonary emboli. Right internal jugular Port-A-Cath terminates in the lower third of the SVC. Subcutaneous emphysema at the port site compatible with recent placement. Mediastinum/Nodes: No discrete thyroid nodules. Unremarkable esophagus. No pathologically enlarged axillary, mediastinal or hilar lymph nodes. Lungs/Pleura: No pneumothorax. No pleural effusion. Coarsely calcified subcentimeter right upper lobe granuloma. Anterior  left upper lobe 0.8 cm pulmonary nodule with coarse internal calcifications, also compatible with a granuloma. No acute consolidative airspace disease, lung masses or additional significant pulmonary nodules. Upper abdomen: No acute abnormality. Musculoskeletal:  No aggressive appearing focal osseous lesions. IMPRESSION: 1. No evidence of metastatic disease in the chest. 2. Aortic Atherosclerosis (ICD10-I70.0). Electronically Signed   By: Ilona Sorrel M.D.   On: 09/14/2020 15:12   NM Bone Scan Whole Body  Result Date: 09/14/2020 CLINICAL DATA:  Breast cancer, new diagnosis EXAM: NUCLEAR MEDICINE WHOLE BODY BONE SCAN TECHNIQUE: Whole body anterior and posterior images were obtained approximately 3 hours after intravenous injection of radiopharmaceutical. RADIOPHARMACEUTICALS:  20.5 mCi Technetium-4m MDP IV COMPARISON:  None Correlation: CT chest 09/14/2020 FINDINGS: Minimal uptake of tracer at the [facility] joints and sternoclavicular joints, as well as RIGHT knee likely degenerative. Mildly inhomogeneous tracer uptake in the posterior ribs bilaterally, without sclerosis or fractures on CT, may represent artifact. No definite sites of abnormal tracer uptake are seen to suggest osseous metastases. Expected urinary tract and soft tissue distribution of tracer. IMPRESSION: No definite scintigraphic evidence of osseous metastatic disease as above. Electronically Signed   By: Lavonia Dana M.D.   On: 09/14/2020 17:39   US BIOPSY (LIVER)  Result Date: 09/20/2020 INDICATION: History of right breast carcinoma with probable metastatic lesions in the liver including a 2.5 cm mass in the caudate lobe of the liver. EXAM:  ULTRASOUND GUIDED CORE BIOPSY OF LIVER MASS MEDICATIONS: None. ANESTHESIA/SEDATION: Fentanyl 75 mcg IV; Versed 1.5 mg IV Moderate Sedation Time:  31 minutes. The patient was continuously monitored during the procedure by the interventional radiology nurse under my direct supervision. PROCEDURE: The procedure,  risks, benefits, and alternatives were explained to the patient. Questions regarding the procedure were encouraged and answered. The patient understands and consents to the procedure. A time-out was performed prior to initiating the procedure. Ultrasound was performed of the liver with various probes to localize lesions. The abdominal wall was prepped with chlorhexidine in a sterile fashion, and a sterile drape was applied covering the operative field. A sterile gown and sterile gloves were used for the procedure. Local anesthesia was provided with 1% Lidocaine. Under ultrasound guidance, a 35 gauge trocar needle was advanced to the level of a lesion within the caudate lobe of the liver near its junction with the right lobe and just anterior to the IVC. After confirming needle tip position, 3 separate coaxial 18 gauge core biopsy samples were obtained and submitted in formalin. A slurry of Gel-Foam pledgets was injected via the 17 gauge needle as the needle was retracted on completion. COMPLICATIONS: None immediate. FINDINGS: The only lesion visible in the liver by ultrasound is the roughly 2.5 cm caudate mass which is vaguely marginated but localized by ultrasound in the exact location seen by prior ultrasound and by MRI. Solid tissue samples were obtained. IMPRESSION: Ultrasound-guided core biopsy performed at the level of a 2.5 cm mass in the caudate lobe of the liver. Electronically Signed   By: Aletta Edouard M.D.   On: 09/20/2020 11:19   DG Fluoro Guide CV Line Right  Result Date: 09/13/2020 CLINICAL DATA:  54 year old female undergoing placement of a surgical port catheter. EXAM: CHEST FLUOROSCOPY TECHNIQUE: Real-time fluoroscopic evaluation of the chest was performed. FLUOROSCOPY TIME:  Fluoroscopy Time:  0 minutes 20 seconds Radiation Exposure Index (if provided by the fluoroscopic device): 0.88 mGy COMPARISON:  None. FINDINGS: A single saved fluoro images submitted for review. The image demonstrates a  right IJ approach single-lumen power injectable port catheter. The catheter tip overlies the superior cavoatrial junction. No evidence of pneumothorax. IMPRESSION: Right IJ approach single-lumen power injectable port catheter. The catheter tip overlies the superior cavoatrial junction. Electronically Signed   By: Jacqulynn Cadet M.D.   On: 09/13/2020 16:15   ECHOCARDIOGRAM COMPLETE  Result Date: 09/26/2020    ECHOCARDIOGRAM REPORT   Patient Name:   PERSEPHONE [facility]RI[facility] Date of Exam: 09/25/2020 Medical Rec #:  [id number]            Height:       64.0 in Accession #:    [id number]           Weight:       113.1 lb Date of Birth:  [birth year]            BSA:          1.535 m Patient Age:    64 years             BP:           112/56 mmHg Patient Gender: F                    HR:           62 bpm. Exam Location:  Outpatient Procedure: 2D Echo Indications:    chemotherapy evaluation v87.41  History:  Patient has prior history of Echocardiogram examinations, most                 recent 04/12/2020. Risk Factors:Former Smoker. PE. cancer.  Sonographer:    Jannett Celestine RDCS (AE) Referring Phys: [place]  1. Left ventricular ejection fraction, by estimation, is 60 to 65%. The left ventricle has normal function. The left ventricle has no regional wall motion abnormalities. Left ventricular diastolic parameters were normal. The average left ventricular global longitudinal strain is -22.2 %. The global longitudinal strain is normal.  2. Right ventricular systolic function is normal. The right ventricular size is mildly enlarged.  3. The mitral valve is normal in structure. Mild mitral valve regurgitation. No evidence of mitral stenosis.  4. The aortic valve is normal in structure. Aortic valve regurgitation is mild. No aortic stenosis is present.  5. The inferior vena cava is normal in size with greater than 50% respiratory variability, suggesting right atrial pressure of 3 mmHg. FINDINGS  Left  Ventricle: Left ventricular ejection fraction, by estimation, is 60 to 65%. The left ventricle has normal function. The left ventricle has no regional wall motion abnormalities. The average left ventricular global longitudinal strain is -22.2 %. The global longitudinal strain is normal. The left ventricular internal cavity size was normal in size. There is no left ventricular hypertrophy. Left ventricular diastolic parameters were normal. Normal left ventricular filling pressure. Right Ventricle: The right ventricular size is mildly enlarged. No increase in right ventricular wall thickness. Right ventricular systolic function is normal. Left Atrium: Left atrial size was normal in size. Right Atrium: Right atrial size was normal in size. Pericardium: There is no evidence of pericardial effusion. Mitral Valve: The mitral valve is normal in structure. Mild mitral valve regurgitation. No evidence of mitral valve stenosis. Tricuspid Valve: The tricuspid valve is normal in structure. Tricuspid valve regurgitation is mild . No evidence of tricuspid stenosis. Aortic Valve: The aortic valve is normal in structure. Aortic valve regurgitation is mild. No aortic stenosis is present. Pulmonic Valve: The pulmonic valve was normal in structure. Pulmonic valve regurgitation is not visualized. No evidence of pulmonic stenosis. Aorta: The aortic root is normal in size and structure. Venous: The inferior vena cava is normal in size with greater than 50% respiratory variability, suggesting right atrial pressure of 3 mmHg. IAS/Shunts: No atrial level shunt detected by color flow Doppler.  LEFT VENTRICLE PLAX 2D LVIDd:         4.70 cm  Diastology LVIDs:         2.80 cm  LV e' medial:    9.14 cm/s LV PW:         0.80 cm  LV E/e' medial:  10.9 LV IVS:        0.60 cm  LV e' lateral:   16.00 cm/s LVOT diam:     1.80 cm  LV E/e' lateral: 6.2 LV SV:         74 LV SV Index:   48       2D Longitudinal Strain LVOT Area:     2.54 cm 2D Strain GLS  Avg:     -22.2 %  RIGHT VENTRICLE RV S prime:     16.90 cm/s TAPSE (M-mode): 2.6 cm LEFT ATRIUM           Index LA diam:      3.10 cm 2.02 cm/m LA Vol (A2C): 53.2 ml 34.65 ml/m  AORTIC VALVE LVOT Vmax:   128.00  cm/s LVOT Vmean:  86.700 cm/s LVOT VTI:    0.292 m  AORTA Ao Root diam: 3.00 cm MITRAL VALVE MV Area (PHT): 2.99 cm    SHUNTS MV Decel Time: 254 msec    Systemic VTI:  0.29 m MV E velocity: 99.50 cm/s  Systemic Diam: 1.80 cm MV A velocity: 49.80 cm/s MV E/A ratio:  2.00 Ena Dawley MD Electronically signed by Ena Dawley MD Signature Date/Time: 09/26/2020/10:17:34 AM    Final         This case was discussed with Dr. Jana Hakim. He expressed his agreement with my management of this patient.

## 2020-10-10 ENCOUNTER — Telehealth: Payer: Self-pay

## 2020-10-10 MED ORDER — AMPHETAMINE-DEXTROAMPHET ER 20 MG PO CP24
20.0000 mg | ORAL_CAPSULE | Freq: Every day | ORAL | 0 refills | Status: DC
Start: 2020-10-10 — End: 2020-11-07

## 2020-10-10 NOTE — Telephone Encounter (Signed)
Last Visit: 07/27/2020 Next Visit: 12/28/2020  Last Fill: 09/11/2020  Okay to refill Adderall?

## 2020-10-10 NOTE — Telephone Encounter (Signed)
Patient called requesting prescription refill of Adderall to be sent to [facility] at [address].   

## 2020-10-11 NOTE — Progress Notes (Signed)
Erica Wood  [phone] Fax:([phone]) [phone]     ID: Erica Wood DOB: 1966-08-31  MR#: [id number]  BTD#:[id number]  Patient Care Team: Erica Blase, MD as PCP - General (Family Medicine) Erica Wood, [place] Bing, MD as Referring Physician (Oncology) Erica Germany, RN as Oncology Nurse Navigator Erica Kaufmann, RN as Oncology Nurse Navigator Erica Bookbinder, MD as Consulting Physician (General Surgery) Erica Wood, Erica Dad, MD as Consulting Physician (Oncology) Erica Rudd, MD as Consulting Physician (Radiation Oncology) Erica Merino, MD as Consulting Physician (Rheumatology) Erica Millers, MD as Consulting Physician (Obstetrics and Gynecology) Erica Wood OTHER MD:  CHIEF COMPLAINT: triple positive breast cancer  CURRENT TREATMENT: Neoadjuvant therapy    INTERVAL HISTORY: Erica Wood returns today for follow up of her triple positive breast cancer.   She began neoadjuvant chemotherapy, consisting of docetaxel, carboplatin, trastuzumab, and pertuzumab every 21 days x6, on 08/31/2020.  Today is day 1 cycle 3.  She had liver biopsy on 09/20/2020, which shows carcinoma consistent with her breast primary.  The prognostic panel showed estrogen and progesterone receptor strongly positive and Her2 negative, with a Ki67 of 30%.   REVIEW OF SYSTEMS: Erica Wood has significant mouth problems after each chemotherapy treatment. She is using a baby brush but even that is very unpleasant. She has a yeast infection. She has flaky skin. She is very worried about some of the reports and the echo regarding regurgitation. Of course she is also concerned about the results of her liver biopsy. She is having some lower extremity edema, which is not entirely new. She is having no peripheral neuropathy symptoms. A detailed review of systems today was otherwise stable.   COVID 19 VACCINATION STATUS:    HISTORY OF CURRENT ILLNESS: From the original intake  note:  Erica Wood herself palpated an abnormality in the right breast several years ago.  This had not changed until sometime in June 2021.  She was evaluated by Dr. Ouida Wood and underwent right diagnostic mammography with tomography and right breast ultrasonography at [facility] on 07/26/2020 showing: breast density category D; 2.6 cm mass in right breast at 6 o'clock, at site of palpable concern; two focal groups of punctate calcifications posterior to palpable mass; no right axillary adenopathy.  Accordingly on 08/04/2020 she proceeded to biopsy of the right breast area in question. The pathology from this procedure ([id number]) showed: invasive ductal carcinoma, grade 3. Prognostic indicators significant for: estrogen receptor, 95% positive with moderate staining intensity and progesterone receptor, 10% positive with strong staining intensity. Proliferation marker Ki67 at 40%. HER2 equivocal by immunohistochemistry (2+), but positive by fluorescent in situ hybridization with a signals ratio 3.23 and number per cell 6.45.  Biopsy of the posterior calcifications was benign.  She also underwent breast MRI on 08/11/2020 showing: breast composition D; 2.6 cm biopsy-proven malignancy in right breast at 6 o'clock, with probable skin involvement noted on previous ultrasound; additional enhancing masses within right breast-- 5 mm in lower-outer, 1.2 cm in upper-inner, 7 mm in upper-outer; no evidence of left breast malignancy or lymphadenopathy; heterogeneous enhancement of sternum, of uncertain significance.  The patient's subsequent history is as detailed below.   PAST MEDICAL HISTORY: Past Medical History:  Diagnosis Date  . Anti-cardiolipin antibody positive   . Anti-cardiolipin antibody syndrome (HCC)   . Anxiety   . Arthralgia   . Arthritis    bil knees, hands  . Atypical chest pain   . Autoimmune disease ([place])   . Cancer ([place]) 07/2020  right breast IDC  . Chronic fatigue   . Depression    . DVT (deep venous thrombosis) ([place])   . IBS (irritable bowel syndrome)    with constipation  . Lupus ([place])   . Pulmonary embolus ([place])   . Raynaud's syndrome   . Sicca ([place])   . Thrombocytopenia ([place])     PAST SURGICAL HISTORY: Past Surgical History:  Procedure Laterality Date  . ABLATION  12/2018   leg veins  . ABLATION Right 08/2019   venous leg   . APPENDECTOMY    . COLON SURGERY     Perforated colon repair after colostomy  . GANGLION CYST EXCISION    . HYSTEROSCOPY WITH D & C N/A 01/13/2014   Procedure: DILATATION AND CURETTAGE /HYSTEROSCOPY with resection ;  Surgeon: Erica Millers, MD;  Location: [facility];  Service: Gynecology;  Laterality: N/A;  . LAPAROTOMY    . PORTACATH PLACEMENT N/A 09/13/2020   Procedure: INSERTION PORT-A-CATH WITH ULTRASOUND GUIDANCE;  Surgeon: Erica Bookbinder, MD;  Location: [facility];  Service: General;  Laterality: N/A;    FAMILY HISTORY: Family History  Problem Relation Age of Onset  . Rheum arthritis Father   . Hemachromatosis Father   . Cancer Sister        bone   as of October 2021 her father isage 82 and her mother age 60.. The patient had one sister (and no brothers). The sister was diagnosed with Ewing's sarcoma at age 15, and died from the disease after many difficult treatments.    GYNECOLOGIC HISTORY:  No LMP recorded. (Menstrual status: Irregular Periods).  Menarche: 58 or 54 years old GX P 0 LMP around 2019 Contraceptive: yes HRT no  Hysterectomy? no BSO? 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 She is widowed. She lives at home with her cat, Erica Wood. She is not a Ambulance person.    ADVANCED DIRECTIVES: not in place; at the 08/16/2020 visit the patient was given the appropriate documents to complete and notarized at her discretion.   HEALTH MAINTENANCE: Social History   Tobacco Use  . Smoking status: Former Smoker    Packs/day: 0.50    Years:  5.00    Pack years: 2.50    Types: Cigarettes    Start date: 08/06/2006    Quit date: 08/07/2011    Years since quitting: 9.1  . Smokeless tobacco: Never Used  Vaping Use  . Vaping Use: Never used  Substance Use Topics  . Alcohol use: Yes    Comment: occasionally  . Drug use: No     Colonoscopy: 2002 for perforated bowel  PAP: 03/2020  Bone density: done at Dr. [facility] office   Allergies  Allergen Reactions  . Beef-Derived Products Hives  . Latex Rash    Contact  . Sulfa Antibiotics Rash    Current Outpatient Medications  Medication Sig Dispense Refill  . acetaminophen (TYLENOL) 500 MG tablet Take 1,000 mg by mouth every 6 (six) hours as needed for moderate pain or headache.    . amphetamine-dextroamphetamine (ADDERALL XR) 20 MG 24 hr capsule Take 1 capsule (20 mg total) by mouth daily. 30 capsule 0  . Ascorbic Acid (VITAMIN C PO) Take 3,000 mg by mouth daily.     Marland Kitchen CAMILA 0.35 MG tablet Take 1 tablet by mouth daily.  11  . Cholecalciferol (VITAMIN D3) 75 MCG (3000 UT) TABS Take 3,000 Units by mouth daily.    Marland Kitchen  dexamethasone (DECADRON) 4 MG tablet Take 2 tablets (8 mg total) by mouth 2 (two) times daily. Start the day before Taxotere. Then take daily x 3 days after chemotherapy. (Patient taking differently: Take 8 mg by mouth See admin instructions. Take 8 mg by mouth twice daily. Start the day before Taxotere. Then take daily x 3 days after chemotherapy.) 30 tablet 1  . Docusate Sodium (COLACE PO) Take 2 tablets by mouth 2 (two) times daily.    Marland Kitchen enoxaparin (LOVENOX) 30 MG/0.3ML injection Inject 0.3 mLs (30 mg total) into the skin daily. (Patient not taking: Reported on 09/18/2020) 6 mL 1  . fluconazole (DIFLUCAN) 100 MG tablet Take as directed (Patient not taking: Reported on 09/18/2020) 30 tablet 0  . hydroxychloroquine (PLAQUENIL) 200 MG tablet Take one tablet by mouth twice daily Monday through Friday. 120 tablet 0  . lidocaine-prilocaine (EMLA) cream Apply to affected area  once (Patient taking differently: Apply 1 application topically daily as needed (port access). ) 30 g 3  . loratadine (CLARITIN) 10 MG tablet Take 10 mg by mouth See admin instructions. Take 10 mg daily for 5 days after chemo treatments    . LORazepam (ATIVAN) 0.5 MG tablet Take 1 tablet (0.5 mg total) by mouth at bedtime as needed (Nausea or vomiting). 30 tablet 0  . magic mouthwash SOLN Take 5 mLs by mouth 4 (four) times daily as needed for mouth pain. (Patient not taking: Reported on 09/18/2020) 240 mL 0  . metoCLOPramide (REGLAN) 10 MG tablet Take 1 tablet (10 mg total) by mouth every 6 (six) hours as needed for nausea. 40 tablet 2  . metroNIDAZOLE (METROGEL) 1 % gel Apply topically daily. 45 g 0  . oxyCODONE (OXY IR/ROXICODONE) 5 MG immediate release tablet Take 1 tablet (5 mg total) by mouth every 6 (six) hours as needed. (Patient not taking: Reported on 09/18/2020) 6 tablet 0  . Pegfilgrastim (NEULASTA [place]) Inject 1 Dose into the skin See admin instructions. Inject 24 to 72 hours after chemo treatment    . polyethylene glycol (MIRALAX / GLYCOLAX) 17 g packet Take 17 g by mouth daily.    . prochlorperazine (COMPAZINE) 10 MG tablet Take 0.5-1 tablets (5-10 mg total) by mouth 4 (four) times daily -  before meals and at bedtime. Start the evening of chemotherapy and take for 2 days, then take as needed (Patient not taking: Reported on 09/18/2020) 30 tablet 1  . Rivaroxaban (XARELTO) 15 MG TABS tablet Take 1 tablet (15 mg total) by mouth daily. 30 tablet 3  . spironolactone (ALDACTONE) 25 MG tablet Take 25 mg by mouth daily as needed (swelling).    . traMADol (ULTRAM) 50 MG tablet Take 1-2 tablets (50-100 mg total) by mouth every 6 (six) hours as needed. (Patient taking differently: Take 50-100 mg by mouth every 6 (six) hours as needed for moderate pain. ) 60 tablet 0  . tretinoin (RETIN-A) 0.1 % cream Apply 1 application topically at bedtime as needed (acne).     . triamterene-hydrochlorothiazide  (DYAZIDE) 37.5-25 MG capsule TAKE 1 CAPSULE BY MOUTH DAILY AS NEEDED FOR SWELLING 90 capsule 0  . UNABLE TO FIND Green Vibrance - 1 scoop daily    . warfarin (COUMADIN) 5 MG tablet Take 1 tablet (5 mg total) by mouth daily. 30 tablet 11   No current facility-administered medications for this visit.    OBJECTIVE: White woman who appears younger than stated age  There were no vitals filed for this visit.  There is no height or weight on file to calculate BMI.   Wt Readings from Last 3 Encounters:  10/03/20 111 lb 3.2 oz (50.4 kg)  09/25/20 114 lb 12.8 oz (52.1 kg)  09/21/20 113 lb 1.6 oz (51.3 kg)      ECOG FS:2 - Symptomatic, <50% confined to bed  Sclerae unicteric, EOMs intact Wearing a mask No cervical or supraclavicular adenopathy Lungs no rales or rhonchi Heart regular rate and rhythm Abd soft, nontender, positive bowel sounds MSK no focal spinal tenderness, no upper extremity lymphedema Neuro: nonfocal, well oriented, appropriate affect Breasts: The mass in the right breast is still palpable but it appears smaller and slightly softer.   LAB RESULTS:  CMP     Component Value Date/Time   NA 136 09/21/2020 0827   NA 137 07/28/2020 1108   K 3.9 09/21/2020 0827   CL 104 09/21/2020 0827   CO2 25 09/21/2020 0827   GLUCOSE 133 (H) 09/21/2020 0827   BUN 7 09/21/2020 0827   BUN 8 07/28/2020 1108   CREATININE 0.68 09/21/2020 0827   CALCIUM 8.9 09/21/2020 0827   PROT 6.4 (L) 09/21/2020 0827   PROT 7.3 07/28/2020 1108   ALBUMIN 3.4 (L) 09/21/2020 0827   ALBUMIN 4.9 07/28/2020 1108   AST 19 09/21/2020 0827   ALT 27 09/21/2020 0827   ALKPHOS 72 09/21/2020 0827   BILITOT 0.4 09/21/2020 0827   GFRNONAA >60 09/21/2020 0827   GFRAA 95 07/28/2020 1108    No results found for: TOTALPROTELP, ALBUMINELP, A1GS, A2GS, BETS, BETA2SER, GAMS, MSPIKE, SPEI  Lab Results  Component Value Date   WBC 6.9 09/21/2020   NEUTROABS 5.9 09/21/2020   HGB 10.4 (L) 09/21/2020   HCT 31.1 (L)  09/21/2020   MCV 92.3 09/21/2020   PLT 360 09/21/2020    No results found for: LABCA2  No components found for: FTDDUK025  No results for input(s): INR in the last 168 hours.  No results found for: LABCA2  No results found for: KYH062  No results found for: BJS283  No results found for: TDV761  No results found for: CA2729  No components found for: HGQUANT  No results found for: CEA1 / No results found for: CEA1   No results found for: AFPTUMOR  No results found for: CHROMOGRNA  No results found for: KPAFRELGTCHN, LAMBDASER, KAPLAMBRATIO (kappa/lambda light chains)  No results found for: HGBA, HGBA2QUANT, HGBFQUANT, HGBSQUAN (Hemoglobinopathy evaluation)   No results found for: LDH  Lab Results  Component Value Date   IRON 88 10/29/2018   IRON 88 10/29/2018   TIBC 241 (L) 10/29/2018   TIBC 241 (L) 10/29/2018   IRONPCTSAT 37 10/29/2018   IRONPCTSAT 37 10/29/2018   (Iron and TIBC)  Lab Results  Component Value Date   FERRITIN 43 10/29/2018   FERRITIN 43 10/29/2018    Urinalysis    Component Value Date/Time   APPEARANCEUR Clear 08/08/2020 1359   GLUCOSEU Negative 08/08/2020 1359   BILIRUBINUR Negative 08/08/2020 1359   PROTEINUR Negative 08/08/2020 1359   NITRITE Negative 08/08/2020 1359   LEUKOCYTESUR Negative 08/08/2020 1359     STUDIES: CT HEAD W & WO CONTRAST  Result Date: 09/14/2020 CLINICAL DATA:  Breast cancer, staging. EXAM: CT HEAD WITHOUT AND WITH CONTRAST TECHNIQUE: Contiguous axial images were obtained from the base of the skull through the vertex without and with intravenous contrast CONTRAST:  31m OMNIPAQUE IOHEXOL 300 MG/ML  SOLN COMPARISON:  None. FINDINGS: Brain: No evidence of acute infarction, hemorrhage, hydrocephalus,  extra-axial collection or mass lesion/mass effect. No focus of abnormal contrast enhancement. Vascular: No hyperdense vessel or unexpected calcification. Visible vessels are patent. Skull: Normal. Negative for  fracture or focal lesion. Sinuses/Orbits: No acute finding. IMPRESSION: No evidence of metastatic disease in the head. Please note that head CT has low sensitivity for small metastatic lesion. In case of high clinical suspicion, recommend MRI of the brain with contrast. Electronically Signed   By: Pedro Earls M.D.   On: 09/14/2020 18:49   CT Chest W Contrast  Result Date: 09/14/2020 CLINICAL DATA:  New diagnosis of right breast cancer. Staging. Chemotherapy initiated 08/31/2020. EXAM: CT CHEST WITH CONTRAST TECHNIQUE: Multidetector CT imaging of the chest was performed during intravenous contrast administration. CONTRAST:  35m OMNIPAQUE IOHEXOL 300 MG/ML  SOLN COMPARISON:  None. FINDINGS: Cardiovascular: Normal heart size. No significant pericardial effusion/thickening. Minimally atherosclerotic nonaneurysmal thoracic aorta. Normal caliber pulmonary arteries. No central pulmonary emboli. Right internal jugular Port-A-Cath terminates in the lower third of the SVC. Subcutaneous emphysema at the port site compatible with recent placement. Mediastinum/Nodes: No discrete thyroid nodules. Unremarkable esophagus. No pathologically enlarged axillary, mediastinal or hilar lymph nodes. Lungs/Pleura: No pneumothorax. No pleural effusion. Coarsely calcified subcentimeter right upper lobe granuloma. Anterior left upper lobe 0.8 cm pulmonary nodule with coarse internal calcifications, also compatible with a granuloma. No acute consolidative airspace disease, lung masses or additional significant pulmonary nodules. Upper abdomen: No acute abnormality. Musculoskeletal:  No aggressive appearing focal osseous lesions. IMPRESSION: 1. No evidence of metastatic disease in the chest. 2. Aortic Atherosclerosis (ICD10-I70.0). Electronically Signed   By: JIlona SorrelM.D.   On: 09/14/2020 15:12   NM Bone Scan Whole Body  Result Date: 09/14/2020 CLINICAL DATA:  Breast cancer, new diagnosis EXAM: NUCLEAR MEDICINE  WHOLE BODY BONE SCAN TECHNIQUE: Whole body anterior and posterior images were obtained approximately 3 hours after intravenous injection of radiopharmaceutical. RADIOPHARMACEUTICALS:  20.5 mCi Technetium-929mDP IV COMPARISON:  None Correlation: CT chest 09/14/2020 FINDINGS: Minimal uptake of tracer at the [facility] and sternoclavicular joints, as well as RIGHT knee likely degenerative. Mildly inhomogeneous tracer uptake in the posterior ribs bilaterally, without sclerosis or fractures on CT, may represent artifact. No definite sites of abnormal tracer uptake are seen to suggest osseous metastases. Expected urinary tract and soft tissue distribution of tracer. IMPRESSION: No definite scintigraphic evidence of osseous metastatic disease as above. Electronically Signed   By: MaLavonia Dana.D.   On: 09/14/2020 17:39   USKoreaIOPSY (LIVER)  Result Date: 09/20/2020 INDICATION: History of right breast carcinoma with probable metastatic lesions in the liver including a 2.5 cm mass in the caudate lobe of the liver. EXAM: ULTRASOUND GUIDED CORE BIOPSY OF LIVER MASS MEDICATIONS: None. ANESTHESIA/SEDATION: Fentanyl 75 mcg IV; Versed 1.5 mg IV Moderate Sedation Time:  31 minutes. The patient was continuously monitored during the procedure by the interventional radiology nurse under my direct supervision. PROCEDURE: The procedure, risks, benefits, and alternatives were explained to the patient. Questions regarding the procedure were encouraged and answered. The patient understands and consents to the procedure. A time-out was performed prior to initiating the procedure. Ultrasound was performed of the liver with various probes to localize lesions. The abdominal wall was prepped with chlorhexidine in a sterile fashion, and a sterile drape was applied covering the operative field. A sterile gown and sterile gloves were used for the procedure. Local anesthesia was provided with 1% Lidocaine. Under ultrasound guidance, a 17 gauge  trocar needle was advanced to the  level of a lesion within the caudate lobe of the liver near its junction with the right lobe and just anterior to the IVC. After confirming needle tip position, 3 separate coaxial 18 gauge core biopsy samples were obtained and submitted in formalin. A slurry of Gel-Foam pledgets was injected via the 17 gauge needle as the needle was retracted on completion. COMPLICATIONS: None immediate. FINDINGS: The only lesion visible in the liver by ultrasound is the roughly 2.5 cm caudate mass which is vaguely marginated but localized by ultrasound in the exact location seen by prior ultrasound and by MRI. Solid tissue samples were obtained. IMPRESSION: Ultrasound-guided core biopsy performed at the level of a 2.5 cm mass in the caudate lobe of the liver. Electronically Signed   By: Aletta Edouard M.D.   On: 09/20/2020 11:19   DG Fluoro Guide CV Line Right  Result Date: 09/13/2020 CLINICAL DATA:  54 year old female undergoing placement of a surgical port catheter. EXAM: CHEST FLUOROSCOPY TECHNIQUE: Real-time fluoroscopic evaluation of the chest was performed. FLUOROSCOPY TIME:  Fluoroscopy Time:  0 minutes 20 seconds Radiation Exposure Index (if provided by the fluoroscopic device): 0.88 mGy COMPARISON:  None. FINDINGS: A single saved fluoro images submitted for review. The image demonstrates a right IJ approach single-lumen power injectable port catheter. The catheter tip overlies the superior cavoatrial junction. No evidence of pneumothorax. IMPRESSION: Right IJ approach single-lumen power injectable port catheter. The catheter tip overlies the superior cavoatrial junction. Electronically Signed   By: Jacqulynn Cadet M.D.   On: 09/13/2020 16:15   ECHOCARDIOGRAM COMPLETE  Result Date: 09/26/2020    ECHOCARDIOGRAM REPORT   Patient Name:   MARLYNN HINCKLEY [facility] Date of Exam: 09/25/2020 Medical Rec #:  [id number]            Height:       64.0 in Accession #:    [id number]            Weight:       113.1 lb Date of Birth:  16-Mar-1966            BSA:          1.535 m Patient Age:    89 years             BP:           112/56 mmHg Patient Gender: F                    HR:           62 bpm. Exam Location:  Outpatient Procedure: 2D Echo Indications:    chemotherapy evaluation v87.41  History:        Patient has prior history of Echocardiogram examinations, most                 recent 04/12/2020. Risk Factors:Former Smoker. PE. cancer.  Sonographer:    Jannett Celestine RDCS (AE) Referring Phys: [place]  1. Left ventricular ejection fraction, by estimation, is 60 to 65%. The left ventricle has normal function. The left ventricle has no regional wall motion abnormalities. Left ventricular diastolic parameters were normal. The average left ventricular global longitudinal strain is -22.2 %. The global longitudinal strain is normal.  2. Right ventricular systolic function is normal. The right ventricular size is mildly enlarged.  3. The mitral valve is normal in structure. Mild mitral valve regurgitation. No evidence of mitral stenosis.  4. The aortic valve is normal in structure. Aortic valve regurgitation  is mild. No aortic stenosis is present.  5. The inferior vena cava is normal in size with greater than 50% respiratory variability, suggesting right atrial pressure of 3 mmHg. FINDINGS  Left Ventricle: Left ventricular ejection fraction, by estimation, is 60 to 65%. The left ventricle has normal function. The left ventricle has no regional wall motion abnormalities. The average left ventricular global longitudinal strain is -22.2 %. The global longitudinal strain is normal. The left ventricular internal cavity size was normal in size. There is no left ventricular hypertrophy. Left ventricular diastolic parameters were normal. Normal left ventricular filling pressure. Right Ventricle: The right ventricular size is mildly enlarged. No increase in right ventricular wall thickness. Right  ventricular systolic function is normal. Left Atrium: Left atrial size was normal in size. Right Atrium: Right atrial size was normal in size. Pericardium: There is no evidence of pericardial effusion. Mitral Valve: The mitral valve is normal in structure. Mild mitral valve regurgitation. No evidence of mitral valve stenosis. Tricuspid Valve: The tricuspid valve is normal in structure. Tricuspid valve regurgitation is mild . No evidence of tricuspid stenosis. Aortic Valve: The aortic valve is normal in structure. Aortic valve regurgitation is mild. No aortic stenosis is present. Pulmonic Valve: The pulmonic valve was normal in structure. Pulmonic valve regurgitation is not visualized. No evidence of pulmonic stenosis. Aorta: The aortic root is normal in size and structure. Venous: The inferior vena cava is normal in size with greater than 50% respiratory variability, suggesting right atrial pressure of 3 mmHg. IAS/Shunts: No atrial level shunt detected by color flow Doppler.  LEFT VENTRICLE PLAX 2D LVIDd:         4.70 cm  Diastology LVIDs:         2.80 cm  LV e' medial:    9.14 cm/s LV PW:         0.80 cm  LV E/e' medial:  10.9 LV IVS:        0.60 cm  LV e' lateral:   16.00 cm/s LVOT diam:     1.80 cm  LV E/e' lateral: 6.2 LV SV:         74 LV SV Index:   48       2D Longitudinal Strain LVOT Area:     2.54 cm 2D Strain GLS Avg:     -22.2 %  RIGHT VENTRICLE RV S prime:     16.90 cm/s TAPSE (M-mode): 2.6 cm LEFT ATRIUM           Index LA diam:      3.10 cm 2.02 cm/m LA Vol (A2C): 53.2 ml 34.65 ml/m  AORTIC VALVE LVOT Vmax:   128.00 cm/s LVOT Vmean:  86.700 cm/s LVOT VTI:    0.292 m  AORTA Ao Root diam: 3.00 cm MITRAL VALVE MV Area (PHT): 2.99 cm    SHUNTS MV Decel Time: 254 msec    Systemic VTI:  0.29 m MV E velocity: 99.50 cm/s  Systemic Diam: 1.80 cm MV A velocity: 49.80 cm/s MV E/A ratio:  2.00 Ena Dawley MD Electronically signed by Ena Dawley MD Signature Date/Time: 09/26/2020/10:17:34 AM    Final        ELIGIBLE FOR AVAILABLE RESEARCH PROTOCOL: AET  ASSESSMENT: 54 y.o. [place] woman status post right breast lower outer quadrant biopsy 08/04/2020 for a clinical T2 N0 M1, stage IV invasive ductal carcinoma, grade 3, triple positive, with an MIB-1 of 40%  (a) breast MRI 08/14/2020 shows 3 additional right breast lesions and heterogeneous  enhancement of the sternum  (b) biopsy of the additional right breast mass 09/05/2020 showed atypical lobular hyperplasia (concordant).  (c) CT scan of the abdomen and pelvis 07/14/2020 shows 2 indeterminate liver lesions  (d) abdominal MRI 08/24/2020 shows multiple liver lesions highly suspicious for liver metastases.  (e) liver biopsy 09/20/2020 positive for carcinoma, prognostic panel estrogen and progesterone receptor positive, but HER-2 negative; MIB-130%  (f) bone scan 09/14/2020 shows no evidence of bone metastases  (g) CT scan of the chest and head 09/14/2020 showed no evidence of metastatic disease in the chest   (1) neoadjuvant chemotherapy to consist of docetaxel, carboplatin, trastuzumab and pertuzumab every 21 days x 6 started 08/31/2020  (2) trastuzumab to continue to complete a year (through October 2022).  (a) echo 04/12/2020 shows an ejection fraction in the 60-65% range  (3) definitive surgery to follow  (4) adjuvant radiation as appropriate  (5) antiestrogens to start at the completion of local treatment   PLAN: Carneshia is not having any peripheral neuropathy or any evidence of cardiac toxicity and we are proceeding with cycle #3 today.  I gave her a copy of her liver biopsy report. What is troubling there is the HER-2 negativity. That is certainly possible. HER-2 may be focal. One biopsy catches a HER-2 patch and another biopsy does not. There are also clones that may be entirely HER-2 negative.  I do think this raises the question whether the liver is responding. The only way to find out is to repeat a liver MRI and we have set  that up for 10/26/2020, before she returns to see me the following week for cycle #4.  I reassured her that her echo does not show any significant regurgitation issues. I gave her a prescription for cranial prosthesis and for thigh-high compression stockings. She will take Diflucan for her yeast infection. She has a recipe on how to make it gargle fluid which will help her mouth with the mouth issues she has after chemo  She knows to call for any other issue that may develop before the next visit  Total encounter time 40 minutes.Sarajane Jews C. Arely Tinner, MD 10/11/2020 12:55 PM Medical Oncology and Hematology [facility] [place], [place] [address] Tel. [phone]    Fax. [phone]   This document serves as a record of services personally performed by Lurline Del, MD. It was created on his behalf by Wilburn Mylar, a trained medical scribe. The creation of this record is based on the scribe's personal observations and the provider's statements to them.   I, Lurline Del MD, have reviewed the above documentation for accuracy and completeness, and I agree with the above.   *Total Encounter Time as defined by the Centers for Medicare and Medicaid Services includes, in addition to the face-to-face time of a patient visit (documented in the note above) non-face-to-face time: obtaining and reviewing outside history, ordering and reviewing medications, tests or procedures, care coordination (communications with other health care professionals or caregivers) and documentation in the medical record.

## 2020-10-12 ENCOUNTER — Other Ambulatory Visit: Payer: Self-pay

## 2020-10-12 ENCOUNTER — Inpatient Hospital Stay (HOSPITAL_BASED_OUTPATIENT_CLINIC_OR_DEPARTMENT_OTHER): Payer: 59 | Admitting: Oncology

## 2020-10-12 ENCOUNTER — Inpatient Hospital Stay: Payer: 59

## 2020-10-12 ENCOUNTER — Inpatient Hospital Stay: Payer: 59 | Attending: Oncology

## 2020-10-12 VITALS — BP 103/54 | HR 68 | Temp 98.2°F | Resp 16

## 2020-10-12 VITALS — BP 120/62 | HR 72 | Temp 98.3°F | Resp 18 | Ht 64.0 in | Wt 107.2 lb

## 2020-10-12 DIAGNOSIS — Z79899 Other long term (current) drug therapy: Secondary | ICD-10-CM | POA: Diagnosis not present

## 2020-10-12 DIAGNOSIS — Z5112 Encounter for antineoplastic immunotherapy: Secondary | ICD-10-CM | POA: Insufficient documentation

## 2020-10-12 DIAGNOSIS — C50511 Malignant neoplasm of lower-outer quadrant of right female breast: Secondary | ICD-10-CM | POA: Diagnosis not present

## 2020-10-12 DIAGNOSIS — Z170 Estrogen receptor positive status [ER+]: Secondary | ICD-10-CM | POA: Diagnosis not present

## 2020-10-12 DIAGNOSIS — Z5111 Encounter for antineoplastic chemotherapy: Secondary | ICD-10-CM | POA: Diagnosis present

## 2020-10-12 DIAGNOSIS — C787 Secondary malignant neoplasm of liver and intrahepatic bile duct: Secondary | ICD-10-CM | POA: Insufficient documentation

## 2020-10-12 LAB — COMPREHENSIVE METABOLIC PANEL
ALT: 16 U/L (ref 0–44)
AST: 16 U/L (ref 15–41)
Albumin: 3.7 g/dL (ref 3.5–5.0)
Alkaline Phosphatase: 76 U/L (ref 38–126)
Anion gap: 9 (ref 5–15)
BUN: 13 mg/dL (ref 6–20)
CO2: 25 mmol/L (ref 22–32)
Calcium: 9.2 mg/dL (ref 8.9–10.3)
Chloride: 103 mmol/L (ref 98–111)
Creatinine, Ser: 0.74 mg/dL (ref 0.44–1.00)
GFR, Estimated: >60 mL/min (ref >60)
Glucose, Bld: 116 mg/dL — ABNORMAL HIGH (ref 70–99)
Potassium: 3.8 mmol/L (ref 3.5–5.1)
Sodium: 137 mmol/L (ref 135–145)
Total Bilirubin: 0.4 mg/dL (ref 0.3–1.2)
Total Protein: 6.5 g/dL (ref 6.5–8.1)

## 2020-10-12 LAB — CBC WITH DIFFERENTIAL/PLATELET
Abs Immature Granulocytes: 0.05 10*3/uL (ref 0.00–0.07)
Basophils Absolute: 0 10*3/uL (ref 0.0–0.1)
Basophils Relative: 0 %
Eosinophils Absolute: 0 10*3/uL (ref 0.0–0.5)
Eosinophils Relative: 0 %
HCT: 30.9 % — ABNORMAL LOW (ref 36.0–46.0)
Hemoglobin: 10.3 g/dL — ABNORMAL LOW (ref 12.0–15.0)
Immature Granulocytes: 0 %
Lymphocytes Relative: 13 %
Lymphs Abs: 1.4 10*3/uL (ref 0.7–4.0)
MCH: 31.4 pg (ref 26.0–34.0)
MCHC: 33.3 g/dL (ref 30.0–36.0)
MCV: 94.2 fL (ref 80.0–100.0)
Monocytes Absolute: 0.9 10*3/uL (ref 0.1–1.0)
Monocytes Relative: 8 %
Neutro Abs: 8.9 10*3/uL — ABNORMAL HIGH (ref 1.7–7.7)
Neutrophils Relative %: 79 %
Platelets: 313 10*3/uL (ref 150–400)
RBC: 3.28 MIL/uL — ABNORMAL LOW (ref 3.87–5.11)
RDW: 15.3 % (ref 11.5–15.5)
WBC: 11.2 10*3/uL — ABNORMAL HIGH (ref 4.0–10.5)
nRBC: 0 % (ref 0.0–0.2)

## 2020-10-12 LAB — PREGNANCY, URINE: Preg Test, Ur: NEGATIVE

## 2020-10-12 MED ORDER — DIPHENHYDRAMINE HCL 25 MG PO CAPS
ORAL_CAPSULE | ORAL | Status: AC
  Filled 2020-10-12: qty 1

## 2020-10-12 MED ORDER — HEPARIN SOD (PORK) LOCK FLUSH 100 UNIT/ML IV SOLN
500.0000 [IU] | Freq: Once | INTRAVENOUS | Status: AC | PRN
  Administered 2020-10-12: 500 [IU]
  Filled 2020-10-12: qty 5

## 2020-10-12 MED ORDER — SODIUM CHLORIDE 0.9 % IV SOLN
10.0000 mg | Freq: Once | INTRAVENOUS | Status: AC
  Administered 2020-10-12: 10 mg via INTRAVENOUS
  Filled 2020-10-12: qty 10

## 2020-10-12 MED ORDER — SODIUM CHLORIDE 0.9 % IV SOLN
Freq: Once | INTRAVENOUS | Status: AC
  Filled 2020-10-12: qty 250

## 2020-10-12 MED ORDER — SODIUM CHLORIDE 0.9 % IV SOLN
150.0000 mg | Freq: Once | INTRAVENOUS | Status: AC
  Administered 2020-10-12: 150 mg via INTRAVENOUS
  Filled 2020-10-12: qty 150

## 2020-10-12 MED ORDER — LORAZEPAM 2 MG/ML IJ SOLN
INTRAMUSCULAR | Status: AC
  Filled 2020-10-12: qty 1

## 2020-10-12 MED ORDER — SODIUM CHLORIDE 0.9 % IV SOLN
420.0000 mg | Freq: Once | INTRAVENOUS | Status: AC
  Administered 2020-10-12: 420 mg via INTRAVENOUS
  Filled 2020-10-12: qty 14

## 2020-10-12 MED ORDER — SODIUM CHLORIDE 0.9% FLUSH
3.0000 mL | INTRAVENOUS | Status: DC | PRN
  Administered 2020-10-12: 3 mL
  Filled 2020-10-12: qty 10

## 2020-10-12 MED ORDER — SODIUM CHLORIDE 0.9 % IV SOLN
449.5000 mg | Freq: Once | INTRAVENOUS | Status: AC
  Administered 2020-10-12: 450 mg via INTRAVENOUS
  Filled 2020-10-12: qty 45

## 2020-10-12 MED ORDER — ACETAMINOPHEN 325 MG PO TABS
ORAL_TABLET | ORAL | Status: AC
  Filled 2020-10-12: qty 2

## 2020-10-12 MED ORDER — SODIUM CHLORIDE 0.9% FLUSH
10.0000 mL | INTRAVENOUS | Status: DC | PRN
  Administered 2020-10-12: 10 mL
  Filled 2020-10-12: qty 10

## 2020-10-12 MED ORDER — SODIUM CHLORIDE 0.9 % IV SOLN
16.0000 mg | Freq: Once | INTRAVENOUS | Status: AC
  Administered 2020-10-12: 16 mg via INTRAVENOUS
  Filled 2020-10-12: qty 8

## 2020-10-12 MED ORDER — DIPHENHYDRAMINE HCL 25 MG PO CAPS
25.0000 mg | ORAL_CAPSULE | Freq: Once | ORAL | Status: AC
  Administered 2020-10-12: 25 mg via ORAL

## 2020-10-12 MED ORDER — ACETAMINOPHEN 325 MG PO TABS
650.0000 mg | ORAL_TABLET | Freq: Once | ORAL | Status: AC
  Administered 2020-10-12: 650 mg via ORAL

## 2020-10-12 MED ORDER — SODIUM CHLORIDE 0.9 % IV SOLN
75.0000 mg/m2 | Freq: Once | INTRAVENOUS | Status: AC
  Administered 2020-10-12: 110 mg via INTRAVENOUS
  Filled 2020-10-12: qty 11

## 2020-10-12 MED ORDER — TRASTUZUMAB-ANNS CHEMO 150 MG IV SOLR
300.0000 mg | Freq: Once | INTRAVENOUS | Status: AC
  Administered 2020-10-12: 300 mg via INTRAVENOUS
  Filled 2020-10-12: qty 14.29

## 2020-10-12 MED ORDER — LORAZEPAM 2 MG/ML IJ SOLN
0.5000 mg | Freq: Once | INTRAMUSCULAR | Status: AC
  Administered 2020-10-12: 0.5 mg via INTRAVENOUS

## 2020-10-12 NOTE — Progress Notes (Signed)
Pt. stable for discharge. Left via ambulation, no respiratory distress noted. 

## 2020-10-12 NOTE — Addendum Note (Signed)
Addended by: Chauncey Cruel on: 10/12/2020 10:12 AM   Modules accepted: Orders

## 2020-10-12 NOTE — Patient Instructions (Signed)
[facility] Discharge Instructions for Patients Receiving Chemotherapy  Today you received the following immunotherapy agents: Trastuzumab and Pertuzumab and chemotherapy agents: Docetaxel and Carboplatin.  To help prevent nausea and vomiting after your treatment, we encourage you to take your nausea medication as directed by your MD.   If you develop nausea and vomiting that is not controlled by your nausea medication, call the clinic.   BELOW ARE SYMPTOMS THAT SHOULD BE REPORTED IMMEDIATELY:  *FEVER GREATER THAN 100.5 F  *CHILLS WITH OR WITHOUT FEVER  NAUSEA AND VOMITING THAT IS NOT CONTROLLED WITH YOUR NAUSEA MEDICATION  *UNUSUAL SHORTNESS OF BREATH  *UNUSUAL BRUISING OR BLEEDING  TENDERNESS IN MOUTH AND THROAT WITH OR WITHOUT PRESENCE OF ULCERS  *URINARY PROBLEMS  *BOWEL PROBLEMS  UNUSUAL RASH Items with * indicate a potential emergency and should be followed up as soon as possible.  Feel free to call the clinic should you have any questions or concerns. The clinic phone number is [phone].  Please show the CHEMO ALERT CARD at check-in to the Emergency Department and triage nurse.   

## 2020-10-12 NOTE — Patient Instructions (Signed)

## 2020-10-14 ENCOUNTER — Ambulatory Visit: Payer: 59

## 2020-10-16 ENCOUNTER — Telehealth: Payer: Self-pay | Admitting: Adult Health

## 2020-10-16 ENCOUNTER — Other Ambulatory Visit: Payer: Self-pay | Admitting: Oncology

## 2020-10-16 NOTE — Telephone Encounter (Signed)
Called for peer to peer on Kyira Volkert MRI abdomen to evaluate status of liver metastases and response to treatment.  It was approved.  [phone] valid for 45 days.    Wilber Bihari, NP

## 2020-10-17 ENCOUNTER — Telehealth: Payer: Self-pay | Admitting: *Deleted

## 2020-10-17 ENCOUNTER — Other Ambulatory Visit: Payer: Self-pay | Admitting: *Deleted

## 2020-10-17 ENCOUNTER — Telehealth: Payer: Self-pay | Admitting: Oncology

## 2020-10-17 ENCOUNTER — Other Ambulatory Visit: Payer: Self-pay

## 2020-10-17 ENCOUNTER — Ambulatory Visit (HOSPITAL_COMMUNITY): Payer: 59

## 2020-10-17 ENCOUNTER — Other Ambulatory Visit (HOSPITAL_COMMUNITY): Payer: 59

## 2020-10-17 ENCOUNTER — Inpatient Hospital Stay: Payer: 59

## 2020-10-17 ENCOUNTER — Ambulatory Visit: Payer: 59

## 2020-10-17 ENCOUNTER — Other Ambulatory Visit: Payer: Self-pay | Admitting: Medical

## 2020-10-17 VITALS — BP 112/65 | HR 68 | Temp 98.2°F | Resp 18

## 2020-10-17 DIAGNOSIS — Z170 Estrogen receptor positive status [ER+]: Secondary | ICD-10-CM

## 2020-10-17 DIAGNOSIS — C50511 Malignant neoplasm of lower-outer quadrant of right female breast: Secondary | ICD-10-CM

## 2020-10-17 DIAGNOSIS — E860 Dehydration: Secondary | ICD-10-CM

## 2020-10-17 DIAGNOSIS — Z5112 Encounter for antineoplastic immunotherapy: Secondary | ICD-10-CM | POA: Diagnosis not present

## 2020-10-17 MED ORDER — SODIUM CHLORIDE 0.9 % IV SOLN
INTRAVENOUS | Status: AC
  Filled 2020-10-17: qty 250

## 2020-10-17 MED ORDER — SODIUM CHLORIDE 0.9 % IV SOLN
INTRAVENOUS | Status: DC
  Filled 2020-10-17 (×2): qty 250

## 2020-10-17 MED ORDER — TRIAMCINOLONE ACETONIDE 0.1 % EX LOTN: 1.0000 "application " | TOPICAL_LOTION | Freq: Three times a day (TID) | CUTANEOUS | 2 refills | Status: DC

## 2020-10-17 NOTE — Telephone Encounter (Signed)
Rescheduled 12/07 appointment, approved per [facility].

## 2020-10-17 NOTE — Telephone Encounter (Signed)
After verification of pa for location of MRI, called [facility] to schedule appt for MRI of liver. Per Ubaldo Glassing, pt will be called by her to schedule appt due to screening questions that have to be answered by pt. Called pt to make aware. Left message on vm to advise that call will be placed by [facility] to further schedule. Advised to call if there are concerns.

## 2020-10-18 NOTE — Telephone Encounter (Signed)
Two new forms completed and returned to [facility] 10/12/2020.  Leave start date corrected on original form completed 09/21/2020.

## 2020-10-19 NOTE — Telephone Encounter (Signed)
I called patient, RX sent to pharmacy, patient is aware

## 2020-10-20 ENCOUNTER — Inpatient Hospital Stay: Payer: 59

## 2020-10-20 ENCOUNTER — Other Ambulatory Visit: Payer: Self-pay | Admitting: *Deleted

## 2020-10-20 ENCOUNTER — Other Ambulatory Visit: Payer: Self-pay

## 2020-10-20 ENCOUNTER — Telehealth: Payer: Self-pay | Admitting: *Deleted

## 2020-10-20 VITALS — BP 101/64 | HR 71 | Temp 98.3°F | Resp 18

## 2020-10-20 DIAGNOSIS — C50511 Malignant neoplasm of lower-outer quadrant of right female breast: Secondary | ICD-10-CM

## 2020-10-20 DIAGNOSIS — E860 Dehydration: Secondary | ICD-10-CM

## 2020-10-20 DIAGNOSIS — R197 Diarrhea, unspecified: Secondary | ICD-10-CM

## 2020-10-20 DIAGNOSIS — Z5112 Encounter for antineoplastic immunotherapy: Secondary | ICD-10-CM | POA: Diagnosis not present

## 2020-10-20 LAB — BASIC METABOLIC PANEL - CANCER CENTER ONLY
Anion gap: 7 (ref 5–15)
BUN: 9 mg/dL (ref 6–20)
CO2: 28 mmol/L (ref 22–32)
Calcium: 8.7 mg/dL — ABNORMAL LOW (ref 8.9–10.3)
Chloride: 102 mmol/L (ref 98–111)
Creatinine: 0.78 mg/dL (ref 0.44–1.00)
GFR, Estimated: >60 mL/min (ref >60)
Glucose, Bld: 90 mg/dL (ref 70–99)
Potassium: 3.9 mmol/L (ref 3.5–5.1)
Sodium: 137 mmol/L (ref 135–145)

## 2020-10-20 MED ORDER — SODIUM CHLORIDE 0.9 % IV SOLN
INTRAVENOUS | Status: AC
  Filled 2020-10-20: qty 250

## 2020-10-20 MED ORDER — HEPARIN SOD (PORK) LOCK FLUSH 100 UNIT/ML IV SOLN
500.0000 [IU] | Freq: Once | INTRAVENOUS | Status: AC
  Administered 2020-10-20: 500 [IU] via INTRAVENOUS
  Filled 2020-10-20: qty 5

## 2020-10-20 NOTE — Progress Notes (Signed)
Patient stable upon discharge. Patient ambulatory to the lobby with no complaints.

## 2020-10-20 NOTE — Patient Instructions (Signed)

## 2020-10-20 NOTE — Telephone Encounter (Signed)
Pt called stating need for IVF " can I come in tomorrow and get them?"  This RN spoke with pt per above- and inquired further.  Erica Wood states she feels dehydrated due to continued diarrhea thru yesterday of 6 + watery stools a day.  She states symptoms have lessened today.  Per discussion she is very reluctant to use antidiarrhea medications due to prior treatment with use she was " constipated for 12 days " - " I would rather have the diarrhea then the constipation "  Note pt has history and diagnosis of IBS with need for laxatives for bowel regulation.  This RN inquired if pt would want to come today for IVF- with Erica Wood stating she would but didn't think there would be availability.  This RN reviewed above with MD and charge nurse- obtained time for infusion today.  Called and informed pt.  Orders entered.

## 2020-10-23 ENCOUNTER — Inpatient Hospital Stay: Payer: 59

## 2020-10-23 ENCOUNTER — Other Ambulatory Visit: Payer: Self-pay | Admitting: Medical

## 2020-10-23 ENCOUNTER — Other Ambulatory Visit: Payer: Self-pay

## 2020-10-23 ENCOUNTER — Inpatient Hospital Stay (HOSPITAL_BASED_OUTPATIENT_CLINIC_OR_DEPARTMENT_OTHER): Payer: 59 | Admitting: Medical

## 2020-10-23 VITALS — BP 107/71 | HR 78 | Temp 98.7°F | Resp 18

## 2020-10-23 DIAGNOSIS — Z170 Estrogen receptor positive status [ER+]: Secondary | ICD-10-CM

## 2020-10-23 DIAGNOSIS — Z5112 Encounter for antineoplastic immunotherapy: Secondary | ICD-10-CM | POA: Diagnosis not present

## 2020-10-23 DIAGNOSIS — C50511 Malignant neoplasm of lower-outer quadrant of right female breast: Secondary | ICD-10-CM

## 2020-10-23 DIAGNOSIS — K581 Irritable bowel syndrome with constipation: Secondary | ICD-10-CM

## 2020-10-23 DIAGNOSIS — R21 Rash and other nonspecific skin eruption: Secondary | ICD-10-CM

## 2020-10-23 DIAGNOSIS — E860 Dehydration: Secondary | ICD-10-CM

## 2020-10-23 MED ORDER — SODIUM CHLORIDE 0.9 % IV SOLN
INTRAVENOUS | Status: AC
  Filled 2020-10-23: qty 250

## 2020-10-23 MED ORDER — HEPARIN SOD (PORK) LOCK FLUSH 100 UNIT/ML IV SOLN
500.0000 [IU] | Freq: Once | INTRAVENOUS | Status: AC
  Administered 2020-10-23: 500 [IU] via INTRAVENOUS
  Filled 2020-10-23: qty 5

## 2020-10-23 MED ORDER — METHYLPREDNISOLONE 4 MG PO TBPK: ORAL_TABLET | ORAL | 0 refills | Status: DC

## 2020-10-23 MED ORDER — SODIUM CHLORIDE 0.9% FLUSH
10.0000 mL | Freq: Once | INTRAVENOUS | Status: AC
  Administered 2020-10-23: 10 mL
  Filled 2020-10-23: qty 10

## 2020-10-23 NOTE — Patient Instructions (Signed)

## 2020-10-23 NOTE — Progress Notes (Signed)
Ms. Erica Wood reports a persistent rash despite using cream that was prescribed. She also reports that her incision from her port seems reddened.  Sandi Mealy PA a bedside to assess her skin . OK to access Pt and proceded with fluids. Lucianne Lei sent new prescription to pharmacy

## 2020-10-25 ENCOUNTER — Ambulatory Visit: Payer: 59

## 2020-10-25 ENCOUNTER — Other Ambulatory Visit: Payer: Self-pay | Admitting: Oncology

## 2020-10-25 DIAGNOSIS — C50511 Malignant neoplasm of lower-outer quadrant of right female breast: Secondary | ICD-10-CM

## 2020-10-25 DIAGNOSIS — Z170 Estrogen receptor positive status [ER+]: Secondary | ICD-10-CM

## 2020-10-25 NOTE — Progress Notes (Signed)
The patient was seen in the infusion room today as she was receiving chemotherapy.  She has a rash over her chest and anterior neck.  She had previously been given a prescription for triamcinolone lotion which has been of little benefit.  She was placed on Medrol Dosepak.  Sandi Mealy, MHS, PA-C Physician Assistant

## 2020-10-27 ENCOUNTER — Other Ambulatory Visit: Payer: Self-pay

## 2020-10-27 ENCOUNTER — Inpatient Hospital Stay: Payer: 59

## 2020-10-27 ENCOUNTER — Other Ambulatory Visit: Payer: Self-pay | Admitting: Oncology

## 2020-10-27 VITALS — BP 99/56 | HR 75 | Temp 98.5°F | Resp 17

## 2020-10-27 DIAGNOSIS — C50511 Malignant neoplasm of lower-outer quadrant of right female breast: Secondary | ICD-10-CM

## 2020-10-27 DIAGNOSIS — E860 Dehydration: Secondary | ICD-10-CM

## 2020-10-27 DIAGNOSIS — Z5112 Encounter for antineoplastic immunotherapy: Secondary | ICD-10-CM | POA: Diagnosis not present

## 2020-10-27 MED ORDER — SODIUM CHLORIDE 0.9 % IV SOLN
Freq: Once | INTRAVENOUS | Status: AC
  Filled 2020-10-27: qty 250

## 2020-10-27 NOTE — Patient Instructions (Signed)

## 2020-11-01 NOTE — Progress Notes (Signed)
[facility]  [phone] Fax:([phone]) [phone]     ID: Erica Wood DOB: 10/15/1966  MR#: [id number]  JYN#:[id number]  Patient Care Team: Eunice Blase, MD as PCP - General (Family Medicine) Prudence Davidson, [place] Bing, MD as Referring Physician (Oncology) Rockwell Germany, RN as Oncology Nurse Navigator Mauro Kaufmann, RN as Oncology Nurse Navigator Rolm Bookbinder, MD as Consulting Physician (General Surgery) Konor Noren, Virgie Dad, MD as Consulting Physician (Oncology) Kyung Rudd, MD as Consulting Physician (Radiation Oncology) Bo Merino, MD as Consulting Physician (Rheumatology) Olga Millers, MD as Consulting Physician (Obstetrics and Gynecology) Chauncey Cruel, MD OTHER MD:  CHIEF COMPLAINT: triple positive breast cancer  CURRENT TREATMENT: Neoadjuvant therapy    INTERVAL HISTORY: Erica Wood returns today for follow up and treatment of her triple positive breast cancer.   She began neoadjuvant chemotherapy, consisting of docetaxel, carboplatin, trastuzumab, and pertuzumab every 21 days x6, on 08/31/2020.  Today is day 1 cycle 4.  Her most recent echocardiogram from 09/25/2020 showed an ejection fraction of 60-65%.  She receives the PEGfilgastrim at home on day 3.  She is scheduled for follow up liver MRI on 11/06/2020.     REVIEW OF SYSTEMS: Erica Wood has developed a rash which is contiguous, erythematous, and flat.  It is not palpable.  It is in the upper chest in a V configuration and then in the neck but not in a semicircle under the jaw.  This is very consistent with a sun rash although she tells me she really has not been outdoors very much.  She does her exercising at home where she has some equipment.  She is very concerned because we are going to not do Perjeta today.  She says she would rather have diarrhea then constipation which is her lifelong concern.  She is having some discomfort in her hands and wondered if we should start the  Plaquenil.  Giving her the chemo with Plaquenil was really disastrous the first cycle so I do not think that is a good idea.  What we are giving her is more immunosuppressant than the Plaquenil in any case.  Finally she is not on Xarelto anymore.  She took herself off because she was feeling bad with that medication.  She is back on warfarin.  We are accordingly resuming INR checks   COVID 19 VACCINATION STATUS: Status post [employer] x2.   HISTORY OF CURRENT ILLNESS: From the original intake note:  Erica Wood herself palpated an abnormality in the right breast several years ago.  This had not changed until sometime in June 2021.  She was evaluated by Dr. Ouida Sills and underwent right diagnostic mammography with tomography and right breast ultrasonography at [facility] on 07/26/2020 showing: breast density category D; 2.6 cm mass in right breast at 6 o'clock, at site of palpable concern; two focal groups of punctate calcifications posterior to palpable mass; no right axillary adenopathy.  Accordingly on 08/04/2020 she proceeded to biopsy of the right breast area in question. The pathology from this procedure ([id number]) showed: invasive ductal carcinoma, grade 3. Prognostic indicators significant for: estrogen receptor, 95% positive with moderate staining intensity and progesterone receptor, 10% positive with strong staining intensity. Proliferation marker Ki67 at 40%. HER2 equivocal by immunohistochemistry (2+), but positive by fluorescent in situ hybridization with a signals ratio 3.23 and number per cell 6.45.  Biopsy of the posterior calcifications was benign.  She also underwent breast MRI on 08/11/2020 showing: breast composition D; 2.6 cm biopsy-proven malignancy in right  breast at 6 o'clock, with probable skin involvement noted on previous ultrasound; additional enhancing masses within right breast-- 5 mm in lower-outer, 1.2 cm in upper-inner, 7 mm in upper-outer; no evidence of left breast  malignancy or lymphadenopathy; heterogeneous enhancement of sternum, of uncertain significance.  The patient's subsequent history is as detailed below.   PAST MEDICAL HISTORY: Past Medical History:  Diagnosis Date  . Anti-cardiolipin antibody positive   . Anti-cardiolipin antibody syndrome (HCC)   . Anxiety   . Arthralgia   . Arthritis    bil knees, hands  . Atypical chest pain   . Autoimmune disease ([place])   . Cancer ([place]) 07/2020   right breast IDC  . Chronic fatigue   . Depression   . DVT (deep venous thrombosis) ([place])   . IBS (irritable bowel syndrome)    with constipation  . Lupus ([place])   . Pulmonary embolus ([place])   . Raynaud's syndrome   . Sicca ([place])   . Thrombocytopenia ([place])     PAST SURGICAL HISTORY: Past Surgical History:  Procedure Laterality Date  . ABLATION  12/2018   leg veins  . ABLATION Right 08/2019   venous leg   . APPENDECTOMY    . COLON SURGERY     Perforated colon repair after colostomy  . GANGLION CYST EXCISION    . HYSTEROSCOPY WITH D & C N/A 01/13/2014   Procedure: DILATATION AND CURETTAGE /HYSTEROSCOPY with resection ;  Surgeon: Olga Millers, MD;  Location: [facility];  Service: Gynecology;  Laterality: N/A;  . LAPAROTOMY    . PORTACATH PLACEMENT N/A 09/13/2020   Procedure: INSERTION PORT-A-CATH WITH ULTRASOUND GUIDANCE;  Surgeon: Rolm Bookbinder, MD;  Location: [facility];  Service: General;  Laterality: N/A;    FAMILY HISTORY: Family History  Problem Relation Age of Onset  . Rheum arthritis Father   . Hemachromatosis Father   . Cancer Sister        bone   as of October 2021 her father isage 65 and her mother age 70.. The patient had one sister (and no brothers). The sister was diagnosed with Ewing's sarcoma at age 1, and died from the disease after many difficult treatments.    GYNECOLOGIC HISTORY:  No LMP recorded. (Menstrual status: Irregular Periods).  Menarche: 41 or 54 years old GX P 0 LMP around  2019 Contraceptive: yes HRT no  Hysterectomy? no BSO? 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 She is widowed. She lives at home with her cat, Mariea Clonts. She is not a Ambulance person.    ADVANCED DIRECTIVES: not in place; at the 08/16/2020 visit the patient was given the appropriate documents to complete and notarized at her discretion.   HEALTH MAINTENANCE: Social History   Tobacco Use  . Smoking status: Former Smoker    Packs/day: 0.50    Years: 5.00    Pack years: 2.50    Types: Cigarettes    Start date: 08/06/2006    Quit date: 08/07/2011    Years since quitting: 9.2  . Smokeless tobacco: Never Used  Vaping Use  . Vaping Use: Never used  Substance Use Topics  . Alcohol use: Yes    Comment: occasionally  . Drug use: No     Colonoscopy: 2002 for perforated bowel  PAP: 03/2020  Bone density: done at [facility]. [facility] office   Allergies  Allergen Reactions  . Beef-Derived Products Hives  . Latex Rash  Contact  . Sulfa Antibiotics Rash    Current Outpatient Medications  Medication Sig Dispense Refill  . acetaminophen (TYLENOL) 500 MG tablet Take 1,000 mg by mouth every 6 (six) hours as needed for moderate pain or headache.    . amphetamine-dextroamphetamine (ADDERALL XR) 20 MG 24 hr capsule Take 1 capsule (20 mg total) by mouth daily. 30 capsule 0  . Ascorbic Acid (VITAMIN C PO) Take 3,000 mg by mouth daily.     Marland Kitchen CAMILA 0.35 MG tablet Take 1 tablet by mouth daily.  11  . Cholecalciferol (VITAMIN D3) 75 MCG (3000 UT) TABS Take 3,000 Units by mouth daily.    Marland Kitchen dexamethasone (DECADRON) 4 MG tablet TAKE 2 TABLETS(8 MG) BY MOUTH TWICE DAILY. START THE DAY BEFORE TAXOTERE. THEN TAKE DAILY FOR 3 DAYS AFTER CHEMOTHERAPY 30 tablet 1  . Docusate Sodium (COLACE PO) Take 2 tablets by mouth 2 (two) times daily.    Marland Kitchen enoxaparin (LOVENOX) 30 MG/0.3ML injection Inject 0.3 mLs (30 mg total) into the skin daily. (Patient not taking:  Reported on 09/18/2020) 6 mL 1  . fluconazole (DIFLUCAN) 100 MG tablet TAKE AS DIRECTED 30 tablet 0  . hydroxychloroquine (PLAQUENIL) 200 MG tablet Take one tablet by mouth twice daily Monday through Friday. 120 tablet 0  . lidocaine-prilocaine (EMLA) cream Apply to affected area once (Patient taking differently: Apply 1 application topically daily as needed (port access). ) 30 g 3  . loratadine (CLARITIN) 10 MG tablet Take 10 mg by mouth See admin instructions. Take 10 mg daily for 5 days after chemo treatments    . LORazepam (ATIVAN) 0.5 MG tablet TAKE 1 TABLET(0.5 MG) BY MOUTH AT BEDTIME AS NEEDED FOR NAUSEA OR VOMITING 30 tablet 0  . magic mouthwash SOLN Take 5 mLs by mouth 4 (four) times daily as needed for mouth pain. (Patient not taking: Reported on 09/18/2020) 240 mL 0  . methylPREDNISolone (MEDROL DOSEPAK) 4 MG TBPK tablet 6 x 1 day, 5 x 1 day, 4 x 1 day, 3 x 1 day, 2 x 1 day, 1 x 1 day 21 tablet 0  . metoCLOPramide (REGLAN) 10 MG tablet Take 1 tablet (10 mg total) by mouth every 6 (six) hours as needed for nausea. 40 tablet 2  . metroNIDAZOLE (METROGEL) 1 % gel Apply topically daily. 45 g 0  . oxyCODONE (OXY IR/ROXICODONE) 5 MG immediate release tablet Take 1 tablet (5 mg total) by mouth every 6 (six) hours as needed. (Patient not taking: Reported on 09/18/2020) 6 tablet 0  . Pegfilgrastim (NEULASTA [place]) Inject 1 Dose into the skin See admin instructions. Inject 24 to 72 hours after chemo treatment    . polyethylene glycol (MIRALAX / GLYCOLAX) 17 g packet Take 17 g by mouth daily.    . prochlorperazine (COMPAZINE) 10 MG tablet Take 0.5-1 tablets (5-10 mg total) by mouth 4 (four) times daily -  before meals and at bedtime. Start the evening of chemotherapy and take for 2 days, then take as needed (Patient not taking: Reported on 09/18/2020) 30 tablet 1  . Rivaroxaban (XARELTO) 15 MG TABS tablet Take 1 tablet (15 mg total) by mouth daily. 30 tablet 3  . spironolactone (ALDACTONE) 25 MG tablet Take  25 mg by mouth daily as needed (swelling).    . traMADol (ULTRAM) 50 MG tablet Take 1-2 tablets (50-100 mg total) by mouth every 6 (six) hours as needed. (Patient taking differently: Take 50-100 mg by mouth every 6 (six) hours as needed for moderate  pain. ) 60 tablet 0  . tretinoin (RETIN-A) 0.1 % cream Apply 1 application topically at bedtime as needed (acne).     . triamcinolone lotion (KENALOG) 0.1 % Apply 1 application topically 3 (three) times daily. 120 mL 2  . triamterene-hydrochlorothiazide (DYAZIDE) 37.5-25 MG capsule TAKE 1 CAPSULE BY MOUTH DAILY AS NEEDED FOR SWELLING 90 capsule 0  . UNABLE TO FIND Green Vibrance - 1 scoop daily    . warfarin (COUMADIN) 5 MG tablet Take 1 tablet (5 mg total) by mouth daily. 30 tablet 11   No current facility-administered medications for this visit.    OBJECTIVE: White woman who appears younger than stated age  54:   11/02/20 0919  BP: 123/64  Pulse: 84  Resp: 18  Temp: 99.2 F (37.3 C)  SpO2: 100%     Body mass index is 18.88 kg/m.   Wt Readings from Last 3 Encounters:  11/02/20 110 lb (49.9 kg)  10/12/20 107 lb 3.2 oz (48.6 kg)  10/03/20 111 lb 3.2 oz (50.4 kg)      ECOG FS:2 - Symptomatic, <50% confined to bed  Sclerae unicteric, EOMs intact Wearing a mask No cervical or supraclavicular adenopathy Lungs no rales or rhonchi Heart regular rate and rhythm Abd soft, nontender, positive bowel sounds MSK no focal spinal tenderness, no upper extremity lymphedema Neuro: nonfocal, well oriented, appropriate affect Breasts: The very easily palpable mass in the right breast is now only palpable with difficulty.  It is actually difficult to tell what one is palpating since both breasts are very lumpy.  At any rate the easily palpable mass noted before is not apparent at present Skin: The rash in the lateral neck and upper chest as described above  LAB RESULTS:  CMP     Component Value Date/Time   NA 137 11/02/2020 0851   NA 137  07/28/2020 1108   K 3.2 (L) 11/02/2020 0851   CL 102 11/02/2020 0851   CO2 25 11/02/2020 0851   GLUCOSE 159 (H) 11/02/2020 0851   BUN 11 11/02/2020 0851   BUN 8 07/28/2020 1108   CREATININE 0.74 11/02/2020 0851   CREATININE 0.78 10/20/2020 1330   CALCIUM 9.0 11/02/2020 0851   PROT 6.2 (L) 11/02/2020 0851   PROT 7.3 07/28/2020 1108   ALBUMIN 3.6 11/02/2020 0851   ALBUMIN 4.9 07/28/2020 1108   AST 16 11/02/2020 0851   AST 19 09/21/2020 0827   ALT 16 11/02/2020 0851   ALT 27 09/21/2020 0827   ALKPHOS 61 11/02/2020 0851   BILITOT 0.3 11/02/2020 0851   BILITOT 0.4 09/21/2020 0827   GFRNONAA >60 11/02/2020 0851   GFRNONAA >60 10/20/2020 1330   GFRAA 95 07/28/2020 1108    No results found for: TOTALPROTELP, ALBUMINELP, A1GS, A2GS, BETS, BETA2SER, GAMS, MSPIKE, SPEI  Lab Results  Component Value Date   WBC 8.9 11/02/2020   NEUTROABS 6.7 11/02/2020   HGB 10.1 (L) 11/02/2020   HCT 30.9 (L) 11/02/2020   MCV 97.2 11/02/2020   PLT 276 11/02/2020    No results found for: LABCA2  No components found for: FXOVAN191  Recent Labs  Lab 11/02/20 1029  INR 4.4*    No results found for: LABCA2  No results found for: YOM600  No results found for: KHT977  No results found for: SFS239  No results found for: CA2729  No components found for: HGQUANT  No results found for: CEA1 / No results found for: CEA1   No results found for: [facility]  No results found for: CHROMOGRNA  No results found for: KPAFRELGTCHN, LAMBDASER, KAPLAMBRATIO (kappa/lambda light chains)  No results found for: HGBA, HGBA2QUANT, HGBFQUANT, HGBSQUAN (Hemoglobinopathy evaluation)   No results found for: LDH  Lab Results  Component Value Date   IRON 88 10/29/2018   IRON 88 10/29/2018   TIBC 241 (L) 10/29/2018   TIBC 241 (L) 10/29/2018   IRONPCTSAT 37 10/29/2018   IRONPCTSAT 37 10/29/2018   (Iron and TIBC)  Lab Results  Component Value Date   FERRITIN 43 10/29/2018   FERRITIN 43 10/29/2018     Urinalysis    Component Value Date/Time   APPEARANCEUR Clear 08/08/2020 1359   GLUCOSEU Negative 08/08/2020 1359   BILIRUBINUR Negative 08/08/2020 1359   PROTEINUR Negative 08/08/2020 1359   NITRITE Negative 08/08/2020 1359   LEUKOCYTESUR Negative 08/08/2020 1359    STUDIES: No results found.   ELIGIBLE FOR AVAILABLE RESEARCH PROTOCOL: AET  ASSESSMENT: 54 y.o. [place] woman status post right breast lower outer quadrant biopsy 08/04/2020 for a clinical T2 N0 M1, stage IV invasive ductal carcinoma, grade 3, triple positive, with an MIB-1 of 40%  (a) breast MRI 08/14/2020 shows 3 additional right breast lesions and heterogeneous enhancement of the sternum  (b) biopsy of the additional right breast mass 09/05/2020 showed atypical lobular hyperplasia (concordant).  (c) CT scan of the abdomen and pelvis 07/14/2020 shows 2 indeterminate liver lesions  (d) abdominal MRI 08/24/2020 shows multiple liver lesions highly suspicious for liver metastases.  (e) liver biopsy 09/20/2020 positive for carcinoma, prognostic panel estrogen and progesterone receptor positive, but HER-2 negative; MIB-130%  (f) bone scan 09/14/2020 shows no evidence of bone metastases  (g) CT scan of the chest and head 09/14/2020 showed no evidence of metastatic disease in the chest   (1) neoadjuvant chemotherapy to consist of docetaxel, carboplatin, trastuzumab and pertuzumab every 21 days x 6 started 08/31/2020  (2) trastuzumab to continue to complete a year (through October 2022).  (a) echo 04/12/2020 shows an ejection fraction in the 60-65% range  (3) definitive surgery to follow  (4) adjuvant radiation as appropriate  (5) antiestrogens to start at the completion of local treatment   PLAN: Erica Wood switched herself off Xarelto back to warfarin.  I was not aware of this.  We did not obtain an INR today but we will obtain one on 01/ 27 or 01 /28 when she returns for supportive care.  She receives intravenous  fluids December 27, 28 and 30, and January 3 and 6.  I am going to see her January 6 and at that point I will set her up most likely for her last 2 cycles of chemoimmunotherapy.  We will consider switching the Herceptin after that to subQ  The rash on the lower neck and upper chest is exactly where sun rash would be, including a "shadow" due to the jaw.  I think that is what we are dealing with.  I asked her to be careful regarding exposure at this point.  I do not think that is a reason to delay chemotherapy.  I am omitting the Perjeta from today's treatment.  She says that she is much more concerned about constipation which is lifelong with her then diarrhea, but nevertheless we have been having to give her fluids of 4 and 5 times after each dose of chemo and perhaps by omitting the Perjeta things will be a little bit simpler for her.  She will have liver MRI on December 27.  She understands we will  not have results that day.  We should have results the next day and she does have a visit with [place] so we can interpret for her.  Any response would be a good news.  The only bad news would be if we see growth instead of shrinkage.  Erica Wood brought the staff and enormous number of cookies today!  She is planning to spend the day before Christmas with family in [place].  She knows to call us for any other issue that may develop before the next visit.  Total encounter time 35 minutes.Sarajane Jews C. Dearies Meikle, MD 11/02/2020 7:36 PM Medical Oncology and Hematology [facility] [place], [place] [address] Tel. [phone]    Fax. [phone]   This document serves as a record of services personally performed by Lurline Del, MD. It was created on his behalf by Wilburn Mylar, a trained medical scribe. The creation of this record is based on the scribe's personal observations and the provider's statements to them.   I, Lurline Del MD, have reviewed the above  documentation for accuracy and completeness, and I agree with the above.   *Total Encounter Time as defined by the Centers for Medicare and Medicaid Services includes, in addition to the face-to-face time of a patient visit (documented in the note above) non-face-to-face time: obtaining and reviewing outside history, ordering and reviewing medications, tests or procedures, care coordination (communications with other health care professionals or caregivers) and documentation in the medical record.

## 2020-11-02 ENCOUNTER — Encounter: Payer: Self-pay | Admitting: *Deleted

## 2020-11-02 ENCOUNTER — Encounter: Payer: Self-pay | Admitting: Licensed Clinical Social Worker

## 2020-11-02 ENCOUNTER — Inpatient Hospital Stay: Payer: 59

## 2020-11-02 ENCOUNTER — Telehealth: Payer: Self-pay | Admitting: *Deleted

## 2020-11-02 ENCOUNTER — Other Ambulatory Visit: Payer: Self-pay

## 2020-11-02 ENCOUNTER — Inpatient Hospital Stay (HOSPITAL_BASED_OUTPATIENT_CLINIC_OR_DEPARTMENT_OTHER): Payer: 59 | Admitting: Oncology

## 2020-11-02 ENCOUNTER — Telehealth: Payer: Self-pay

## 2020-11-02 VITALS — BP 123/64 | HR 84 | Temp 99.2°F | Resp 18 | Ht 64.0 in | Wt 110.0 lb

## 2020-11-02 DIAGNOSIS — Z5112 Encounter for antineoplastic immunotherapy: Secondary | ICD-10-CM | POA: Diagnosis not present

## 2020-11-02 DIAGNOSIS — Z95828 Presence of other vascular implants and grafts: Secondary | ICD-10-CM | POA: Insufficient documentation

## 2020-11-02 DIAGNOSIS — C50511 Malignant neoplasm of lower-outer quadrant of right female breast: Secondary | ICD-10-CM

## 2020-11-02 DIAGNOSIS — Z170 Estrogen receptor positive status [ER+]: Secondary | ICD-10-CM

## 2020-11-02 LAB — CBC WITH DIFFERENTIAL/PLATELET
Abs Immature Granulocytes: 0.06 10*3/uL (ref 0.00–0.07)
Basophils Absolute: 0 10*3/uL (ref 0.0–0.1)
Basophils Relative: 0 %
Eosinophils Absolute: 0 10*3/uL (ref 0.0–0.5)
Eosinophils Relative: 0 %
HCT: 30.9 % — ABNORMAL LOW (ref 36.0–46.0)
Hemoglobin: 10.1 g/dL — ABNORMAL LOW (ref 12.0–15.0)
Immature Granulocytes: 1 %
Lymphocytes Relative: 16 %
Lymphs Abs: 1.5 10*3/uL (ref 0.7–4.0)
MCH: 31.8 pg (ref 26.0–34.0)
MCHC: 32.7 g/dL (ref 30.0–36.0)
MCV: 97.2 fL (ref 80.0–100.0)
Monocytes Absolute: 0.7 10*3/uL (ref 0.1–1.0)
Monocytes Relative: 8 %
Neutro Abs: 6.7 10*3/uL (ref 1.7–7.7)
Neutrophils Relative %: 75 %
Platelets: 276 10*3/uL (ref 150–400)
RBC: 3.18 MIL/uL — ABNORMAL LOW (ref 3.87–5.11)
RDW: 15.7 % — ABNORMAL HIGH (ref 11.5–15.5)
WBC: 8.9 10*3/uL (ref 4.0–10.5)
nRBC: 0 % (ref 0.0–0.2)

## 2020-11-02 LAB — COMPREHENSIVE METABOLIC PANEL
ALT: 16 U/L (ref 0–44)
AST: 16 U/L (ref 15–41)
Albumin: 3.6 g/dL (ref 3.5–5.0)
Alkaline Phosphatase: 61 U/L (ref 38–126)
Anion gap: 10 (ref 5–15)
BUN: 11 mg/dL (ref 6–20)
CO2: 25 mmol/L (ref 22–32)
Calcium: 9 mg/dL (ref 8.9–10.3)
Chloride: 102 mmol/L (ref 98–111)
Creatinine, Ser: 0.74 mg/dL (ref 0.44–1.00)
GFR, Estimated: >60 mL/min (ref >60)
Glucose, Bld: 159 mg/dL — ABNORMAL HIGH (ref 70–99)
Potassium: 3.2 mmol/L — ABNORMAL LOW (ref 3.5–5.1)
Sodium: 137 mmol/L (ref 135–145)
Total Bilirubin: 0.3 mg/dL (ref 0.3–1.2)
Total Protein: 6.2 g/dL — ABNORMAL LOW (ref 6.5–8.1)

## 2020-11-02 LAB — PROTIME-INR
INR: 4.4 (ref 0.8–1.2)
Prothrombin Time: 40.9 seconds — ABNORMAL HIGH (ref 11.4–15.2)

## 2020-11-02 LAB — PREGNANCY, URINE: Preg Test, Ur: NEGATIVE

## 2020-11-02 MED ORDER — SODIUM CHLORIDE 0.9 % IV SOLN
75.0000 mg/m2 | Freq: Once | INTRAVENOUS | Status: AC
  Administered 2020-11-02: 110 mg via INTRAVENOUS
  Filled 2020-11-02: qty 11

## 2020-11-02 MED ORDER — HEPARIN SOD (PORK) LOCK FLUSH 100 UNIT/ML IV SOLN
500.0000 [IU] | Freq: Once | INTRAVENOUS | Status: AC | PRN
  Administered 2020-11-02: 500 [IU]
  Filled 2020-11-02: qty 5

## 2020-11-02 MED ORDER — SODIUM CHLORIDE 0.9 % IV SOLN
150.0000 mg | Freq: Once | INTRAVENOUS | Status: AC
  Administered 2020-11-02: 150 mg via INTRAVENOUS
  Filled 2020-11-02: qty 150

## 2020-11-02 MED ORDER — SODIUM CHLORIDE 0.9 % IV SOLN
449.5000 mg | Freq: Once | INTRAVENOUS | Status: AC
  Administered 2020-11-02: 450 mg via INTRAVENOUS
  Filled 2020-11-02: qty 45

## 2020-11-02 MED ORDER — SODIUM CHLORIDE 0.9 % IV SOLN
Freq: Once | INTRAVENOUS | Status: AC
  Filled 2020-11-02: qty 250

## 2020-11-02 MED ORDER — SODIUM CHLORIDE 0.9% FLUSH
10.0000 mL | Freq: Once | INTRAVENOUS | Status: AC
  Administered 2020-11-02: 10 mL
  Filled 2020-11-02: qty 10

## 2020-11-02 MED ORDER — SODIUM CHLORIDE 0.9 % IV SOLN
10.0000 mg | Freq: Once | INTRAVENOUS | Status: AC
  Administered 2020-11-02: 10 mg via INTRAVENOUS
  Filled 2020-11-02: qty 1

## 2020-11-02 MED ORDER — SODIUM CHLORIDE 0.9% FLUSH
10.0000 mL | INTRAVENOUS | Status: DC | PRN
  Administered 2020-11-02: 10 mL
  Filled 2020-11-02: qty 10

## 2020-11-02 MED ORDER — LORAZEPAM 2 MG/ML IJ SOLN
INTRAMUSCULAR | Status: AC
  Filled 2020-11-02: qty 1

## 2020-11-02 MED ORDER — ACETAMINOPHEN 325 MG PO TABS
ORAL_TABLET | ORAL | Status: AC
  Filled 2020-11-02: qty 2

## 2020-11-02 MED ORDER — ACETAMINOPHEN 325 MG PO TABS
650.0000 mg | ORAL_TABLET | Freq: Once | ORAL | Status: AC
  Administered 2020-11-02: 650 mg via ORAL

## 2020-11-02 MED ORDER — TRASTUZUMAB-ANNS CHEMO 150 MG IV SOLR
300.0000 mg | Freq: Once | INTRAVENOUS | Status: AC
  Administered 2020-11-02: 300 mg via INTRAVENOUS
  Filled 2020-11-02: qty 14.29

## 2020-11-02 MED ORDER — DIPHENHYDRAMINE HCL 25 MG PO CAPS
25.0000 mg | ORAL_CAPSULE | Freq: Once | ORAL | Status: AC
  Administered 2020-11-02: 25 mg via ORAL

## 2020-11-02 MED ORDER — SODIUM CHLORIDE 0.9 % IV SOLN
16.0000 mg | Freq: Once | INTRAVENOUS | Status: AC
  Administered 2020-11-02: 16 mg via INTRAVENOUS
  Filled 2020-11-02: qty 8

## 2020-11-02 MED ORDER — LORAZEPAM 2 MG/ML IJ SOLN
0.5000 mg | Freq: Once | INTRAMUSCULAR | Status: AC
  Administered 2020-11-02: 0.5 mg via INTRAVENOUS

## 2020-11-02 MED ORDER — DIPHENHYDRAMINE HCL 25 MG PO CAPS
ORAL_CAPSULE | ORAL | Status: AC
  Filled 2020-11-02: qty 1

## 2020-11-02 NOTE — Telephone Encounter (Signed)
This RN received Panic value for INR of 4.4. Verified lab was drawn peripherally.  Pt MD pt needs to hold coumadin 5 mg for 2 days and then resume at 1/2 tab ( 2.5mg  ) daily until recheck next week.  This RN spoke with pt per phone call- verified dose - informed her of above- Erica Wood verbalized understanding.

## 2020-11-02 NOTE — Telephone Encounter (Signed)
CRITICAL VALUE STICKER  CRITICAL VALUE: INR = 4.4  RECEIVER (on-site recipient of call): Yetta Glassman, [place] NOTIFIED: 11/02/20 at 2:16pm  MESSENGER (representative from lab): Ulice Dash  MD NOTIFIED: Dr. Jana Hakim  TIME OF NOTIFICATION: 11/02/20 at 2:20pm  RESPONSE: Notification given to Val, RN for follow-up with provider.

## 2020-11-02 NOTE — Progress Notes (Unsigned)
INR RESULTS CALLED TO READ BACK BY AND VERIFIED WITH [employer] RN @ [facility] [phone]

## 2020-11-02 NOTE — Patient Instructions (Signed)
[facility] Discharge Instructions for Patients Receiving Chemotherapy  Today you received the following chemotherapy agents Trastuzumab, Perjeta, Taxotere and Carboplatin   To help prevent nausea and vomiting after your treatment, we encourage you to take your nausea medication as directed.    If you develop nausea and vomiting that is not controlled by your nausea medication, call the clinic.   BELOW ARE SYMPTOMS THAT SHOULD BE REPORTED IMMEDIATELY:  *FEVER GREATER THAN 100.5 F  *CHILLS WITH OR WITHOUT FEVER  NAUSEA AND VOMITING THAT IS NOT CONTROLLED WITH YOUR NAUSEA MEDICATION  *UNUSUAL SHORTNESS OF BREATH  *UNUSUAL BRUISING OR BLEEDING  TENDERNESS IN MOUTH AND THROAT WITH OR WITHOUT PRESENCE OF ULCERS  *URINARY PROBLEMS  *BOWEL PROBLEMS  UNUSUAL RASH Items with * indicate a potential emergency and should be followed up as soon as possible.  Feel free to call the clinic should you have any questions or concerns. The clinic phone number is [phone].  Please show the CHEMO ALERT CARD at check-in to the Emergency Department and triage nurse.   

## 2020-11-02 NOTE — Progress Notes (Signed)
[facility] CSW Progress Note  Holiday representative met with patient  In infusion for brief coping support and resource follow-up. Erica Wood is hopeful that the changes to her plan, including additional hydration, will help with side effects she has been experiencing. She will try to spend tomorrow with family if she is feeling well enough.  Erica Wood returned her application for [place]. CSW submitted today.     Christeen Douglas , LCSW

## 2020-11-06 ENCOUNTER — Other Ambulatory Visit: Payer: Self-pay | Admitting: *Deleted

## 2020-11-06 ENCOUNTER — Inpatient Hospital Stay: Payer: 59

## 2020-11-06 ENCOUNTER — Ambulatory Visit
Admission: RE | Admit: 2020-11-06 | Discharge: 2020-11-06 | Disposition: A | Discharge status: Home / Self Care | Payer: 59 | Source: Ambulatory Visit | Attending: Oncology | Admitting: Oncology

## 2020-11-06 ENCOUNTER — Other Ambulatory Visit: Payer: Self-pay

## 2020-11-06 VITALS — BP 117/66 | HR 79 | Temp 98.0°F | Resp 13 | Ht 64.0 in | Wt 110.1 lb

## 2020-11-06 DIAGNOSIS — C50511 Malignant neoplasm of lower-outer quadrant of right female breast: Secondary | ICD-10-CM

## 2020-11-06 DIAGNOSIS — Z5112 Encounter for antineoplastic immunotherapy: Secondary | ICD-10-CM | POA: Diagnosis not present

## 2020-11-06 DIAGNOSIS — Z95828 Presence of other vascular implants and grafts: Secondary | ICD-10-CM

## 2020-11-06 DIAGNOSIS — Z86718 Personal history of other venous thrombosis and embolism: Secondary | ICD-10-CM

## 2020-11-06 DIAGNOSIS — Z86711 Personal history of pulmonary embolism: Secondary | ICD-10-CM

## 2020-11-06 IMAGING — MR MR ABDOMEN WO/W CM
17 series · 48 of 48 positions shown · IV contrast (multihance)
Comparison: [DATE]

CLINICAL DATA: Breast cancer metastatic to the liver, currently
undergoing chemotherapy, restaging assessment.

EXAM:
MRI ABDOMEN WITHOUT AND WITH CONTRAST
TECHNIQUE: Multiplanar multisequence MR imaging of the abdomen was performed
both before and after the administration of intravenous contrast.
CONTRAST:  9mL MULTIHANCE GADOBENATE DIMEGLUMINE 529 MG/ML IV SOLN

[Series 3: T2 · coronal · 5.0mm · 1.56mm/px · 2 of 31 slices shown (1 of 3)]
[im 1/31]
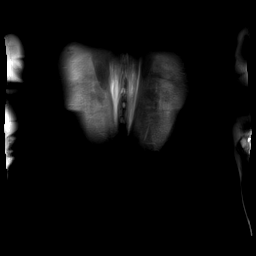
[im 31/31]
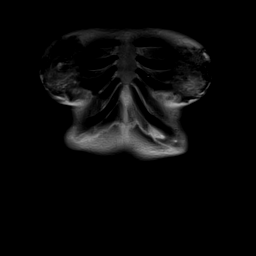

[Series 4: T1 · axial · 3.0mm · 1.06mm/px · z∈[-63,+150]mm · 6 of 144 slices shown]
[im 1/144]
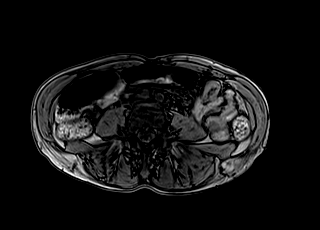
[im 29/144]
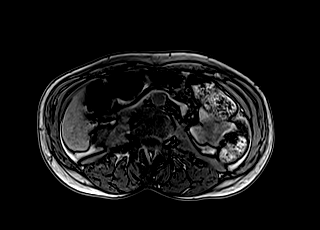
[im 58/144]
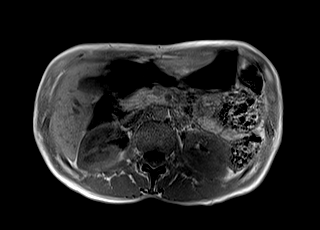
[im 86/144]
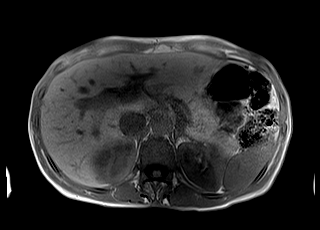
[im 115/144]
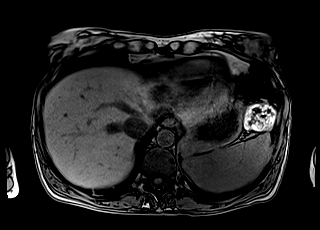
[im 144/144]
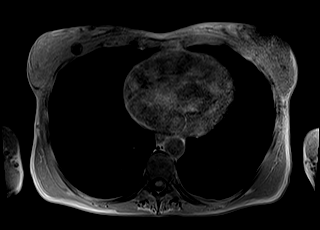

[Series 5: bSSFP · axial · 5.0mm · 1.18mm/px · 1 of 38 slices shown]
[im 1/38]
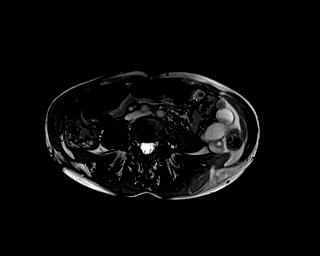

[Series 6: T2 · axial · 5.0mm · 1.33mm/px · 1 of 38 slices shown (2 of 3)]
[im 1/38]
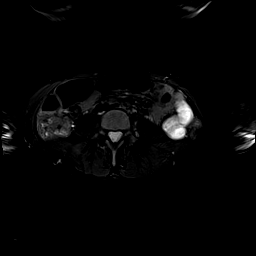

[Series 7: DWI · axial · 5.0mm · 1.42mm/px · z∈[-70,+158]mm · 5 of 117 slices shown (1 of 2)]
[im 1/117]
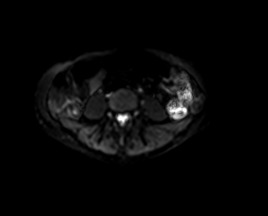
[im 30/117]
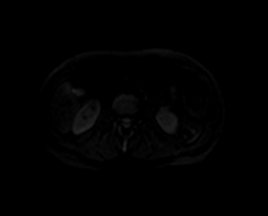
[im 59/117]
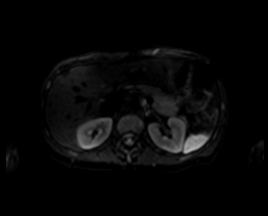
[im 88/117]
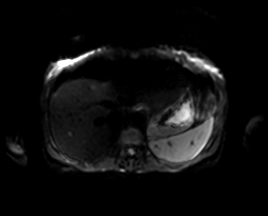
[im 117/117]
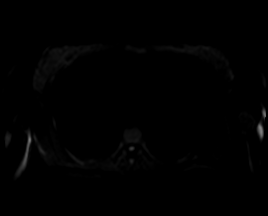

[Series 8: DWI · axial · 5.0mm · 1.42mm/px · z∈[-70,+158]mm · 2 of 39 slices shown (2 of 2)]
[im 1/39]
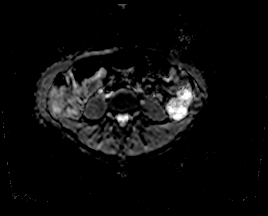
[im 39/39]
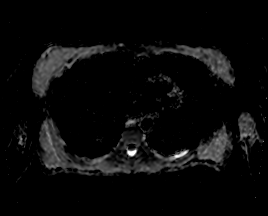

[Series 9: T2 · axial · 6.0mm · 1.06mm/px · 1 of 30 slices shown (3 of 3)]
[im 1/30]
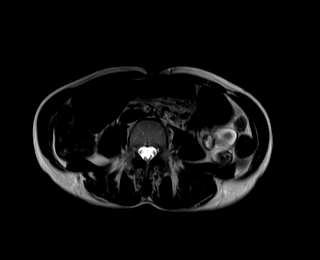

[Series 10: T1 dynamic · axial · non-contrast · 3.0mm · 1.06mm/px · z∈[-75,+162]mm · 3 of 80 slices shown]
[im 1/80]
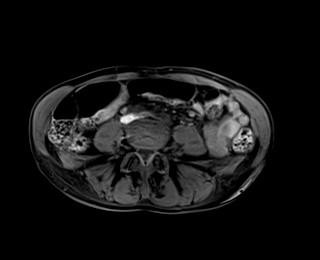
[im 40/80]
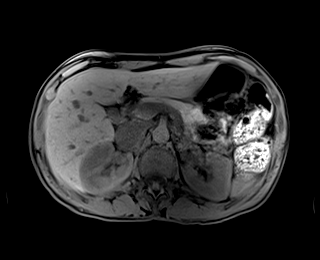
[im 80/80]
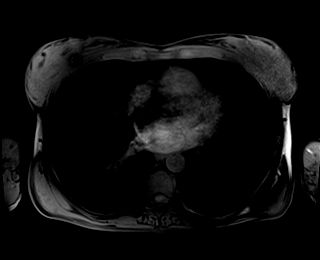

[Series 11: T1 dynamic post-contrast · axial · 3.0mm · 1.06mm/px · z∈[-75,+162]mm · 3 of 80 slices shown (1 of 9)]
[im 1/80]
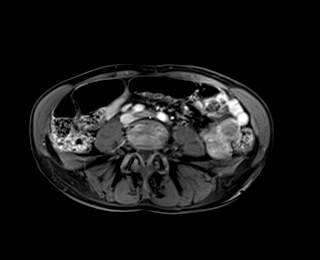
[im 40/80]
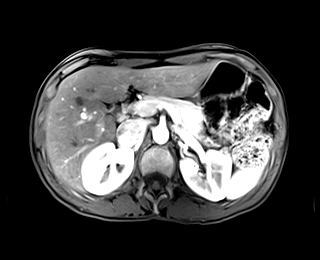
[im 80/80]
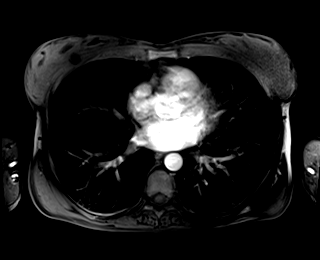

[Series 12: T1 dynamic post-contrast · axial · 3.0mm · 1.06mm/px · z∈[-75,+162]mm · 3 of 80 slices shown (2 of 9)]
[im 1/80]
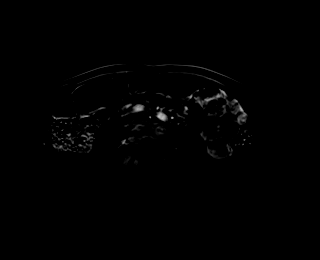
[im 40/80]
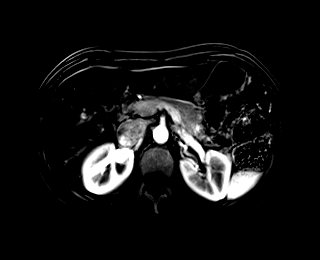
[im 80/80]
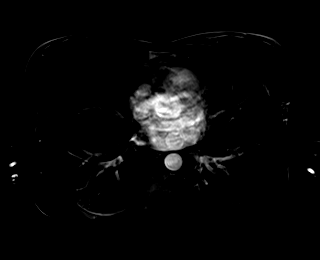

[Series 13: T1 dynamic post-contrast · axial · 3.0mm · 1.06mm/px · z∈[-75,+162]mm · 3 of 80 slices shown (3 of 9)]
[im 1/80]
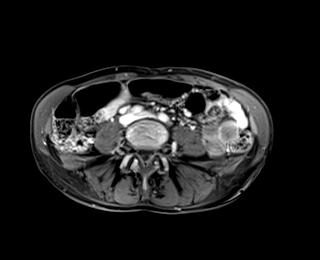
[im 40/80]
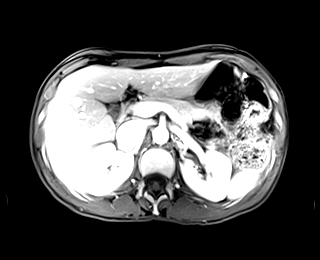
[im 80/80]
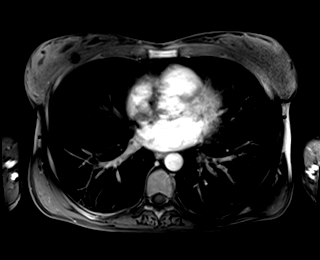

[Series 14: T1 dynamic post-contrast · axial · 3.0mm · 1.06mm/px · z∈[-75,+162]mm · 3 of 80 slices shown (4 of 9)]
[im 1/80]
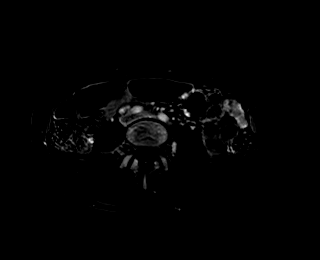
[im 40/80]
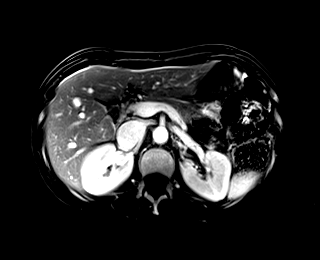
[im 80/80]
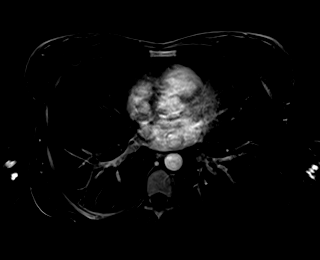

[Series 15: T1 dynamic post-contrast · axial · 3.0mm · 1.06mm/px · z∈[-75,+162]mm · 3 of 80 slices shown (5 of 9)]
[im 1/80]
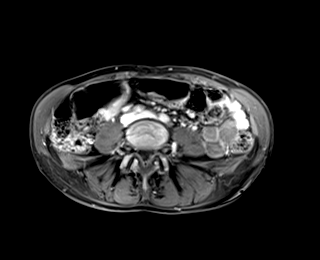
[im 40/80]
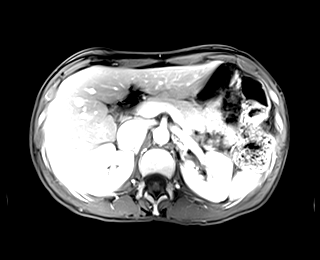
[im 80/80]
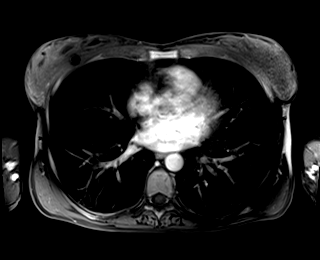

[Series 16: T1 dynamic post-contrast · axial · 3.0mm · 1.06mm/px · z∈[-75,+162]mm · 3 of 80 slices shown (6 of 9)]
[im 1/80]
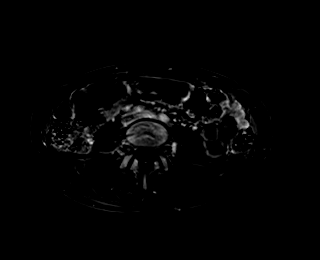
[im 40/80]
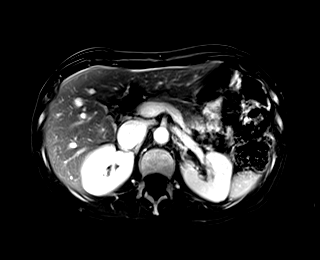
[im 80/80]
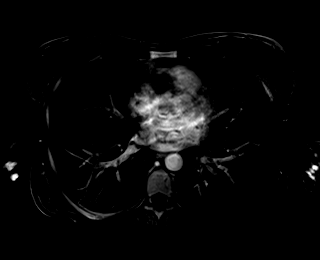

[Series 17: T1 dynamic post-contrast · coronal · 3.0mm · 1.09mm/px · 3 of 72 slices shown (7 of 9)]
[im 1/72]
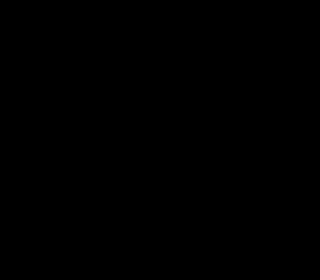
[im 36/72]
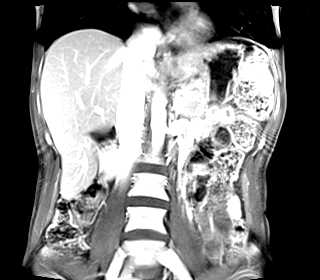
[im 72/72]
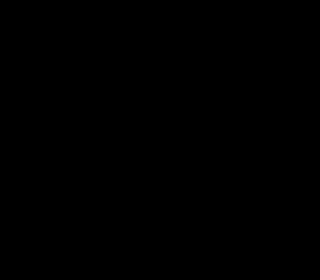

[Series 18: T1 dynamic post-contrast · axial · 3.0mm · 1.06mm/px · z∈[-75,+162]mm · 3 of 80 slices shown (8 of 9)]
[im 1/80]
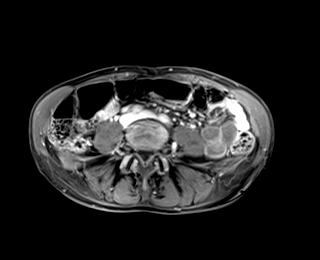
[im 40/80]
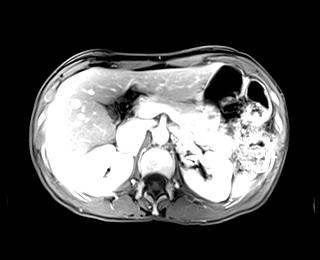
[im 80/80]
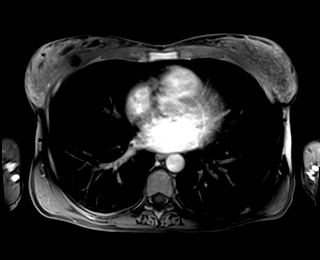

[Series 19: T1 dynamic post-contrast · axial · 3.0mm · 1.06mm/px · z∈[-75,+162]mm · 3 of 80 slices shown (9 of 9)]
[im 1/80]
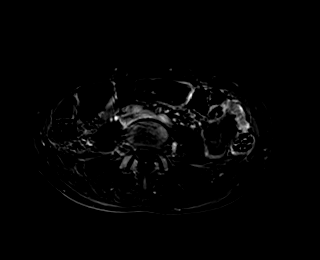
[im 40/80]
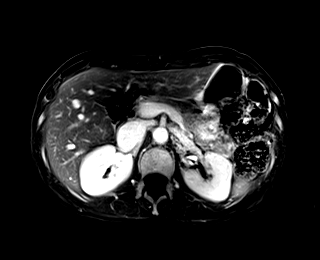
[im 80/80]
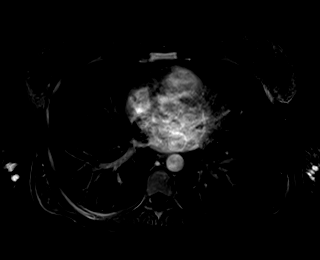

[48 of 48 positions shown; findings below may reference images not displayed]

FINDINGS: Lower chest: Unremarkable

Hepatobiliary: Small dependent gallstone in the gallbladder on image
25 of series 5, about 0.3 cm in diameter. No biliary dilatation.

About 14 mostly small rim enhancing liver lesions are present. One
of the larger lesions is in the left hepatic lobe on image 18 of
series 10 and measures 0.8 by 0.8 cm, previously 1.1 by 0.9 cm by my
measurements. Another lesion is present in the caudate lobe and
measures 1.7 by 1.5 cm on image 25 of series 10, previously 2.6 by
2.1 cm by my measurements. Most of the remaining lesions are
substantially smaller than these 2 index lesions, and roughly
similar to the prior exam. There is some transient hepatic
attenuation difference on early arterial phase images in segment 6.

Pancreas:  Unremarkable

Spleen:  Unremarkable

Adrenals/Urinary Tract:  Unremarkable

Stomach/Bowel: Prominent stool throughout the colon favors
constipation.

Vascular/Lymphatic:  Unremarkable

Other:  No supplemental non-categorized findings.

Musculoskeletal: Unremarkable
IMPRESSION: 1. About 14 mostly small rim enhancing liver lesions are present.
The two dominant index lesions are moderately smaller than on
[DATE], with the remaining small lesions roughly stable.
2. Prominent stool throughout the colon favors constipation.
3. Small dependent gallstone in the gallbladder.

## 2020-11-06 MED ORDER — HEPARIN SOD (PORK) LOCK FLUSH 100 UNIT/ML IV SOLN
500.0000 [IU] | Freq: Once | INTRAVENOUS | Status: AC
  Administered 2020-11-06: 500 [IU]
  Filled 2020-11-06: qty 5

## 2020-11-06 MED ORDER — SODIUM CHLORIDE 0.9% FLUSH
10.0000 mL | Freq: Once | INTRAVENOUS | Status: AC
  Administered 2020-11-06: 10 mL
  Filled 2020-11-06: qty 10

## 2020-11-06 MED ORDER — SODIUM CHLORIDE 0.9 % IV SOLN
INTRAVENOUS | Status: DC
  Filled 2020-11-06 (×2): qty 250

## 2020-11-06 MED ORDER — GADOBENATE DIMEGLUMINE 529 MG/ML IV SOLN
9.0000 mL | Freq: Once | INTRAVENOUS | Status: AC | PRN
  Administered 2020-11-06: 9 mL via INTRAVENOUS

## 2020-11-06 NOTE — Patient Instructions (Signed)

## 2020-11-07 ENCOUNTER — Other Ambulatory Visit: Payer: Self-pay

## 2020-11-07 ENCOUNTER — Inpatient Hospital Stay: Payer: 59

## 2020-11-07 ENCOUNTER — Encounter: Payer: Self-pay | Admitting: *Deleted

## 2020-11-07 VITALS — BP 107/62 | HR 78 | Temp 98.4°F | Resp 18

## 2020-11-07 DIAGNOSIS — Z86711 Personal history of pulmonary embolism: Secondary | ICD-10-CM

## 2020-11-07 DIAGNOSIS — Z95828 Presence of other vascular implants and grafts: Secondary | ICD-10-CM

## 2020-11-07 DIAGNOSIS — Z86718 Personal history of other venous thrombosis and embolism: Secondary | ICD-10-CM

## 2020-11-07 DIAGNOSIS — Z5112 Encounter for antineoplastic immunotherapy: Secondary | ICD-10-CM | POA: Diagnosis not present

## 2020-11-07 DIAGNOSIS — C50511 Malignant neoplasm of lower-outer quadrant of right female breast: Secondary | ICD-10-CM

## 2020-11-07 LAB — CBC WITH DIFFERENTIAL/PLATELET
Abs Immature Granulocytes: 1.06 10*3/uL — ABNORMAL HIGH (ref 0.00–0.07)
Basophils Absolute: 0.1 10*3/uL (ref 0.0–0.1)
Basophils Relative: 1 %
Eosinophils Absolute: 0 10*3/uL (ref 0.0–0.5)
Eosinophils Relative: 0 %
HCT: 28.6 % — ABNORMAL LOW (ref 36.0–46.0)
Hemoglobin: 9.2 g/dL — ABNORMAL LOW (ref 12.0–15.0)
Immature Granulocytes: 9 %
Lymphocytes Relative: 12 %
Lymphs Abs: 1.4 10*3/uL (ref 0.7–4.0)
MCH: 31.9 pg (ref 26.0–34.0)
MCHC: 32.2 g/dL (ref 30.0–36.0)
MCV: 99.3 fL (ref 80.0–100.0)
Monocytes Absolute: 0.1 10*3/uL (ref 0.1–1.0)
Monocytes Relative: 0 %
Neutro Abs: 9.2 10*3/uL — ABNORMAL HIGH (ref 1.7–7.7)
Neutrophils Relative %: 78 %
Platelets: 184 10*3/uL (ref 150–400)
RBC: 2.88 MIL/uL — ABNORMAL LOW (ref 3.87–5.11)
RDW: 14.7 % (ref 11.5–15.5)
WBC: 11.9 10*3/uL — ABNORMAL HIGH (ref 4.0–10.5)
nRBC: 0 % (ref 0.0–0.2)

## 2020-11-07 LAB — COMPREHENSIVE METABOLIC PANEL
ALT: 15 U/L (ref 0–44)
AST: 14 U/L — ABNORMAL LOW (ref 15–41)
Albumin: 3 g/dL — ABNORMAL LOW (ref 3.5–5.0)
Alkaline Phosphatase: 44 U/L (ref 38–126)
Anion gap: 2 — ABNORMAL LOW (ref 5–15)
BUN: 20 mg/dL (ref 6–20)
CO2: 30 mmol/L (ref 22–32)
Calcium: 8.1 mg/dL — ABNORMAL LOW (ref 8.9–10.3)
Chloride: 104 mmol/L (ref 98–111)
Creatinine, Ser: 0.61 mg/dL (ref 0.44–1.00)
GFR, Estimated: >60 mL/min (ref >60)
Glucose, Bld: 97 mg/dL (ref 70–99)
Potassium: 3.5 mmol/L (ref 3.5–5.1)
Sodium: 136 mmol/L (ref 135–145)
Total Bilirubin: 0.4 mg/dL (ref 0.3–1.2)
Total Protein: 5 g/dL — ABNORMAL LOW (ref 6.5–8.1)

## 2020-11-07 LAB — PROTIME-INR
INR: 2.1 — ABNORMAL HIGH (ref 0.8–1.2)
Prothrombin Time: 22.7 seconds — ABNORMAL HIGH (ref 11.4–15.2)

## 2020-11-07 MED ORDER — HEPARIN SOD (PORK) LOCK FLUSH 100 UNIT/ML IV SOLN
500.0000 [IU] | Freq: Once | INTRAVENOUS | Status: AC
  Administered 2020-11-07: 500 [IU]
  Filled 2020-11-07: qty 5

## 2020-11-07 MED ORDER — SODIUM CHLORIDE 0.9% FLUSH
10.0000 mL | Freq: Once | INTRAVENOUS | Status: AC
  Administered 2020-11-07: 10 mL
  Filled 2020-11-07: qty 10

## 2020-11-07 MED ORDER — SODIUM CHLORIDE 0.9 % IV SOLN
INTRAVENOUS | Status: DC
  Filled 2020-11-07 (×2): qty 250

## 2020-11-07 MED ORDER — AMPHETAMINE-DEXTROAMPHET ER 20 MG PO CP24: 20.0000 mg | ORAL_CAPSULE | Freq: Every day | ORAL | 0 refills | Status: DC

## 2020-11-07 NOTE — Telephone Encounter (Signed)
Patient called requesting prescription refill of Adderall to be sent to [facility] at [address].   

## 2020-11-07 NOTE — Patient Instructions (Signed)

## 2020-11-07 NOTE — Telephone Encounter (Signed)
Last Visit: 07/27/2020 Next Visit: 12/28/2020  Current Dose per office note on 07/27/2020: dose not specified.  Last fill: 10/10/2020   Okay to refill adderall?

## 2020-11-09 ENCOUNTER — Other Ambulatory Visit: Payer: Self-pay

## 2020-11-09 ENCOUNTER — Inpatient Hospital Stay: Payer: 59

## 2020-11-09 VITALS — BP 96/60 | HR 84 | Temp 98.3°F | Resp 18

## 2020-11-09 DIAGNOSIS — Z5112 Encounter for antineoplastic immunotherapy: Secondary | ICD-10-CM | POA: Diagnosis not present

## 2020-11-09 DIAGNOSIS — Z95828 Presence of other vascular implants and grafts: Secondary | ICD-10-CM

## 2020-11-09 MED ORDER — SODIUM CHLORIDE 0.9 % IV SOLN
INTRAVENOUS | Status: DC
  Filled 2020-11-09 (×2): qty 250

## 2020-11-09 MED ORDER — HEPARIN SOD (PORK) LOCK FLUSH 100 UNIT/ML IV SOLN
500.0000 [IU] | Freq: Once | INTRAVENOUS | Status: AC
  Administered 2020-11-09: 500 [IU]
  Filled 2020-11-09: qty 5

## 2020-11-09 MED ORDER — SODIUM CHLORIDE 0.9% FLUSH
10.0000 mL | Freq: Once | INTRAVENOUS | Status: AC
  Administered 2020-11-09: 10 mL
  Filled 2020-11-09: qty 10

## 2020-11-09 NOTE — Progress Notes (Signed)
Pt reports she has a rash again on her chest-not itching . Red splotchy rash present across upper chest. Pt said she will continue to use Triamcinolon cream and try to see a provider on monday.

## 2020-11-09 NOTE — Patient Instructions (Signed)

## 2020-11-11 HISTORY — PX: EYE SURGERY: SHX253

## 2020-11-13 ENCOUNTER — Inpatient Hospital Stay: Payer: 59 | Attending: Oncology

## 2020-11-13 ENCOUNTER — Other Ambulatory Visit: Payer: Self-pay | Admitting: Medical

## 2020-11-13 ENCOUNTER — Other Ambulatory Visit: Payer: Self-pay

## 2020-11-13 ENCOUNTER — Inpatient Hospital Stay (HOSPITAL_BASED_OUTPATIENT_CLINIC_OR_DEPARTMENT_OTHER): Payer: 59 | Admitting: Medical

## 2020-11-13 VITALS — BP 118/68 | HR 72 | Temp 98.2°F | Resp 18

## 2020-11-13 DIAGNOSIS — Z5111 Encounter for antineoplastic chemotherapy: Secondary | ICD-10-CM | POA: Diagnosis present

## 2020-11-13 DIAGNOSIS — C50511 Malignant neoplasm of lower-outer quadrant of right female breast: Secondary | ICD-10-CM | POA: Diagnosis present

## 2020-11-13 DIAGNOSIS — R531 Weakness: Secondary | ICD-10-CM | POA: Insufficient documentation

## 2020-11-13 DIAGNOSIS — Z170 Estrogen receptor positive status [ER+]: Secondary | ICD-10-CM | POA: Insufficient documentation

## 2020-11-13 DIAGNOSIS — Z5112 Encounter for antineoplastic immunotherapy: Secondary | ICD-10-CM | POA: Diagnosis present

## 2020-11-13 DIAGNOSIS — L309 Dermatitis, unspecified: Secondary | ICD-10-CM

## 2020-11-13 DIAGNOSIS — Z7901 Long term (current) use of anticoagulants: Secondary | ICD-10-CM | POA: Diagnosis not present

## 2020-11-13 DIAGNOSIS — Z95828 Presence of other vascular implants and grafts: Secondary | ICD-10-CM

## 2020-11-13 MED ORDER — SODIUM CHLORIDE 0.9% FLUSH
10.0000 mL | Freq: Once | INTRAVENOUS | Status: DC
  Filled 2020-11-13: qty 10

## 2020-11-13 MED ORDER — SODIUM CHLORIDE 0.9 % IV SOLN
INTRAVENOUS | Status: DC
  Filled 2020-11-13 (×2): qty 250

## 2020-11-13 MED ORDER — HEPARIN SOD (PORK) LOCK FLUSH 100 UNIT/ML IV SOLN
500.0000 [IU] | Freq: Once | INTRAVENOUS | Status: DC
  Filled 2020-11-13: qty 5

## 2020-11-13 MED ORDER — FLUOCINONIDE 0.1 % EX CREA: 1.0000 "application " | TOPICAL_CREAM | Freq: Two times a day (BID) | CUTANEOUS | 2 refills | Status: DC

## 2020-11-13 NOTE — Progress Notes (Signed)
Pt discharged in no apparent distress. Pt left ambulatory without assistance. Pt aware of discharge instructions and verbalized understanding and had no further questions.  

## 2020-11-13 NOTE — Progress Notes (Signed)
Patient was seen in the infusion room today.  She continues to have an erythematous and pruritic rash over her hands, chest, and upper extremities despite her use of triamcinolone lotion.  Based on this she was given a prescription for fluocinonide 0.1% cream which she can use twice daily.  It was discussed with her today that this was likely due to her ongoing treatment with [facility].  I explained to her that we may need to place her on a low-dose steroid taper if this high strength steroid that is being prescribed to her today does not benefit.  Marga Hoots, MHS, PA-C Physician Assistant

## 2020-11-15 NOTE — Progress Notes (Signed)
[place]  Telephone:([phone]) [phone] Fax:(336) [phone]     ID: Erica Wood DOB: 1966/04/10  MR#: [id number]  KYH#:[id number]  Patient Care Team: Eunice Blase, MD as PCP - General (Family Medicine) Prudence Davidson, Doral Bing, MD as Referring Physician (Oncology) Rockwell Germany, RN as Oncology Nurse Navigator Mauro Kaufmann, RN as Oncology Nurse Navigator Rolm Bookbinder, MD as Consulting Physician (General Surgery) Meia Emley, Virgie Dad, MD as Consulting Physician (Oncology) Kyung Rudd, MD as Consulting Physician (Radiation Oncology) Bo Merino, MD as Consulting Physician (Rheumatology) Olga Millers, MD as Consulting Physician (Obstetrics and Gynecology) Chauncey Cruel, MD OTHER MD:  CHIEF COMPLAINT: triple positive breast cancer  CURRENT TREATMENT: Neoadjuvant therapy    INTERVAL HISTORY: Erica Wood returns today for follow up and treatment of her triple positive breast cancer.   She began neoadjuvant chemotherapy, consisting of docetaxel, carboplatin, trastuzumab, and pertuzumab every 21 days x6, on 08/31/2020.  She will be due for cycle 5 11/23/2020 and cycle 6 12/14/2020.  After that she will be ready for her breast surgery and in preparation for that I am setting her up for a breast MRI 12/21/2020  Her most recent echocardiogram from 09/25/2020 showed an ejection fraction of 60-65%.  Her next echo will be due mid February.  She receives the PEGfilgastrim at home on day 3.  She has no problems from this.  She underwent follow up liver MRI on 11/06/2020 showing: about 14 mostly small rim enhancing liver lesion present; two dominant index lesions are moderately smaller than on 08/24/2020, with remaining small lesion roughly stable.  We discussed this at length today   REVIEW OF SYSTEMS: Erica Wood tells me she is severely constipated.  This is not a new problem for her, but more of a lifelong issue.  She does not do well with laxatives because she gets  dehydrated.  She says that stool softeners do not work.  She has a little bit of a rash particularly in the dorsum of her left wrist and the upper anterior neck area.  There is also scattered acne-like rash over the abdomen, very sparse.  She says she is feeling a little bit weaker a little bit longer but she is still takes walks up to half an hour at a time.  She tells me the fluids she receives x5 after each chemotherapy treatment really help.  She had some questions regarding extending her disability.  Otherwise a detailed review of systems today was stable   COVID 19 VACCINATION STATUS: Status post [employer] x2.   HISTORY OF CURRENT ILLNESS: From the original intake note:  Erica Wood herself palpated an abnormality in the right breast several years ago.  This had not changed until sometime in June 2021.  She was evaluated by Dr. Ouida Sills and underwent right diagnostic mammography with tomography and right breast ultrasonography at [facility] on 07/26/2020 showing: breast density category D; 2.6 cm mass in right breast at 6 o'clock, at site of palpable concern; two focal groups of punctate calcifications posterior to palpable mass; no right axillary adenopathy.  Accordingly on 08/04/2020 she proceeded to biopsy of the right breast area in question. The pathology from this procedure ([id number]) showed: invasive ductal carcinoma, grade 3. Prognostic indicators significant for: estrogen receptor, 95% positive with moderate staining intensity and progesterone receptor, 10% positive with strong staining intensity. Proliferation marker Ki67 at 40%. HER2 equivocal by immunohistochemistry (2+), but positive by fluorescent in situ hybridization with a signals ratio 3.23 and number per cell 6.45.  Biopsy of the posterior calcifications was benign.  She also underwent breast MRI on 08/11/2020 showing: breast composition D; 2.6 cm biopsy-proven malignancy in right breast at 6 o'clock, with probable skin  involvement noted on previous ultrasound; additional enhancing masses within right breast-- 5 mm in lower-outer, 1.2 cm in upper-inner, 7 mm in upper-outer; no evidence of left breast malignancy or lymphadenopathy; heterogeneous enhancement of sternum, of uncertain significance.  The patient's subsequent history is as detailed below.   PAST MEDICAL HISTORY: Past Medical History:  Diagnosis Date  . Anti-cardiolipin antibody positive   . Anti-cardiolipin antibody syndrome (HCC)   . Anxiety   . Arthralgia   . Arthritis    bil knees, hands  . Atypical chest pain   . Autoimmune disease ([place])   . Cancer ([place]) 07/2020   right breast IDC  . Chronic fatigue   . Depression   . DVT (deep venous thrombosis) ([place])   . IBS (irritable bowel syndrome)    with constipation  . Lupus ([place])   . Pulmonary embolus ([place])   . Raynaud's syndrome   . Sicca ([place])   . Thrombocytopenia ([place])     PAST SURGICAL HISTORY: Past Surgical History:  Procedure Laterality Date  . ABLATION  12/2018   leg veins  . ABLATION Right 08/2019   venous leg   . APPENDECTOMY    . COLON SURGERY     Perforated colon repair after colostomy  . GANGLION CYST EXCISION    . HYSTEROSCOPY WITH D & C N/A 01/13/2014   Procedure: DILATATION AND CURETTAGE /HYSTEROSCOPY with resection ;  Surgeon: Olga Millers, MD;  Location: [facility];  Service: Gynecology;  Laterality: N/A;  . LAPAROTOMY    . PORTACATH PLACEMENT N/A 09/13/2020   Procedure: INSERTION PORT-A-CATH WITH ULTRASOUND GUIDANCE;  Surgeon: Rolm Bookbinder, MD;  Location: [facility];  Service: General;  Laterality: N/A;    FAMILY HISTORY: Family History  Problem Relation Age of Onset  . Rheum arthritis Father   . Hemachromatosis Father   . Cancer Sister        bone   as of October 2021 her father isage 101 and her mother age 75.. The patient had one sister (and no brothers). The sister was diagnosed with Ewing's sarcoma at age 11, and died from the  disease after many difficult treatments.    GYNECOLOGIC HISTORY:  No LMP recorded. (Menstrual status: Irregular Periods).  Menarche: 54 or 55 years old GX P 0 LMP around 2019 Contraceptive: yes HRT no  Hysterectomy? no BSO? 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 She is widowed. She lives at home with her cat, Mariea Clonts. She is not a Ambulance person.    ADVANCED DIRECTIVES: not in place; at the 08/16/2020 visit the patient was given the appropriate documents to complete and notarized at her discretion.   HEALTH MAINTENANCE: Social History   Tobacco Use  . Smoking status: Former Smoker    Packs/day: 0.50    Years: 5.00    Pack years: 2.50    Types: Cigarettes    Start date: 08/06/2006    Quit date: 08/07/2011    Years since quitting: 9.2  . Smokeless tobacco: Never Used  Vaping Use  . Vaping Use: Never used  Substance Use Topics  . Alcohol use: Yes    Comment: occasionally  . Drug use: No     Colonoscopy: 2002 for perforated bowel  PAP: 03/2020  Bone density: done at Dr. [facility] office   Allergies  Allergen Reactions  . Beef-Derived Products Hives  . Latex Rash    Contact  . Sulfa Antibiotics Rash    Current Outpatient Medications  Medication Sig Dispense Refill  . acetaminophen (TYLENOL) 500 MG tablet Take 1,000 mg by mouth every 6 (six) hours as needed for moderate pain or headache.    . amphetamine-dextroamphetamine (ADDERALL XR) 20 MG 24 hr capsule Take 1 capsule (20 mg total) by mouth daily. 30 capsule 0  . Ascorbic Acid (VITAMIN C PO) Take 3,000 mg by mouth daily.     Marland Kitchen CAMILA 0.35 MG tablet Take 1 tablet by mouth daily.  11  . Cholecalciferol (VITAMIN D3) 75 MCG (3000 UT) TABS Take 3,000 Units by mouth daily.    Marland Kitchen dexamethasone (DECADRON) 4 MG tablet TAKE 2 TABLETS(8 MG) BY MOUTH TWICE DAILY. START THE DAY BEFORE TAXOTERE. THEN TAKE DAILY FOR 3 DAYS AFTER CHEMOTHERAPY 30 tablet 1  . Docusate Sodium  (COLACE PO) Take 2 tablets by mouth 2 (two) times daily.    Marland Kitchen enoxaparin (LOVENOX) 30 MG/0.3ML injection Inject 0.3 mLs (30 mg total) into the skin daily. (Patient not taking: Reported on 09/18/2020) 6 mL 1  . fluconazole (DIFLUCAN) 100 MG tablet TAKE AS DIRECTED 30 tablet 0  . Fluocinonide 0.1 % CREA Apply 1 application topically 2 (two) times daily. 240 g 2  . hydroxychloroquine (PLAQUENIL) 200 MG tablet Take one tablet by mouth twice daily Monday through Friday. 120 tablet 0  . lidocaine-prilocaine (EMLA) cream Apply to affected area once (Patient taking differently: Apply 1 application topically daily as needed (port access). ) 30 g 3  . loratadine (CLARITIN) 10 MG tablet Take 10 mg by mouth See admin instructions. Take 10 mg daily for 5 days after chemo treatments    . LORazepam (ATIVAN) 0.5 MG tablet TAKE 1 TABLET(0.5 MG) BY MOUTH AT BEDTIME AS NEEDED FOR NAUSEA OR VOMITING 30 tablet 0  . magic mouthwash SOLN Take 5 mLs by mouth 4 (four) times daily as needed for mouth pain. (Patient not taking: Reported on 09/18/2020) 240 mL 0  . metoCLOPramide (REGLAN) 10 MG tablet Take 1 tablet (10 mg total) by mouth every 6 (six) hours as needed for nausea. 40 tablet 2  . metroNIDAZOLE (METROGEL) 1 % gel Apply topically daily. 45 g 0  . oxyCODONE (OXY IR/ROXICODONE) 5 MG immediate release tablet Take 1 tablet (5 mg total) by mouth every 6 (six) hours as needed. (Patient not taking: Reported on 09/18/2020) 6 tablet 0  . Pegfilgrastim (NEULASTA [place]) Inject 1 Dose into the skin See admin instructions. Inject 24 to 72 hours after chemo treatment    . polyethylene glycol (MIRALAX / GLYCOLAX) 17 g packet Take 17 g by mouth daily.    . prochlorperazine (COMPAZINE) 10 MG tablet Take 0.5-1 tablets (5-10 mg total) by mouth 4 (four) times daily -  before meals and at bedtime. Start the evening of chemotherapy and take for 2 days, then take as needed (Patient not taking: Reported on 09/18/2020) 30 tablet 1  . Rivaroxaban  (XARELTO) 15 MG TABS tablet Take 1 tablet (15 mg total) by mouth daily. 30 tablet 3  . spironolactone (ALDACTONE) 25 MG tablet Take 25 mg by mouth daily as needed (swelling).    . traMADol (ULTRAM) 50 MG tablet Take 1-2 tablets (50-100 mg total) by mouth every 6 (six) hours as needed. (Patient taking differently: Take 50-100 mg by mouth every 6 (  six) hours as needed for moderate pain. ) 60 tablet 0  . tretinoin (RETIN-A) 0.1 % cream Apply 1 application topically at bedtime as needed (acne).     . triamcinolone lotion (KENALOG) 0.1 % Apply 1 application topically 3 (three) times daily. 120 mL 2  . triamterene-hydrochlorothiazide (DYAZIDE) 37.5-25 MG capsule TAKE 1 CAPSULE BY MOUTH DAILY AS NEEDED FOR SWELLING 90 capsule 0  . UNABLE TO FIND Green Vibrance - 1 scoop daily    . warfarin (COUMADIN) 5 MG tablet Take 1 tablet (5 mg total) by mouth daily. 30 tablet 11   No current facility-administered medications for this visit.    OBJECTIVE: White woman who appears younger than stated age  19:   11/16/20 0816  BP: 115/68  Pulse: 78  Resp: 18  Temp: 97.8 F (36.6 C)  SpO2: 100%     Body mass index is 19.14 kg/m.   Wt Readings from Last 3 Encounters:  11/16/20 111 lb 8 oz (50.6 kg)  11/06/20 110 lb 1.6 oz (49.9 kg)  11/02/20 110 lb (49.9 kg)      ECOG FS:2 - Symptomatic, <50% confined to bed  Sclerae unicteric, EOMs intact Wearing a mask No cervical or supraclavicular adenopathy Lungs no rales or rhonchi Heart regular rate and rhythm Abd soft, nontender, positive bowel sounds MSK no focal spinal tenderness, no upper extremity lymphedema Neuro: nonfocal, well oriented, appropriate affect Breasts: Both breasts are lumpy and difficult to examine.  The easily palpable mass originally palpated is not palpable at this point.   LAB RESULTS:  CMP     Component Value Date/Time   NA 136 11/07/2020 0829   NA 137 07/28/2020 1108   K 3.5 11/07/2020 0829   CL 104 11/07/2020 0829    CO2 30 11/07/2020 0829   GLUCOSE 97 11/07/2020 0829   BUN 20 11/07/2020 0829   BUN 8 07/28/2020 1108   CREATININE 0.61 11/07/2020 0829   CREATININE 0.78 10/20/2020 1330   CALCIUM 8.1 (L) 11/07/2020 0829   PROT 5.0 (L) 11/07/2020 0829   PROT 7.3 07/28/2020 1108   ALBUMIN 3.0 (L) 11/07/2020 0829   ALBUMIN 4.9 07/28/2020 1108   AST 14 (L) 11/07/2020 0829   AST 19 09/21/2020 0827   ALT 15 11/07/2020 0829   ALT 27 09/21/2020 0827   ALKPHOS 44 11/07/2020 0829   BILITOT 0.4 11/07/2020 0829   BILITOT 0.4 09/21/2020 0827   GFRNONAA >60 11/07/2020 0829   GFRNONAA >60 10/20/2020 1330   GFRAA 95 07/28/2020 1108    No results found for: TOTALPROTELP, ALBUMINELP, A1GS, A2GS, BETS, BETA2SER, GAMS, MSPIKE, SPEI  Lab Results  Component Value Date   WBC 11.9 (H) 11/07/2020   NEUTROABS 9.2 (H) 11/07/2020   HGB 9.2 (L) 11/07/2020   HCT 28.6 (L) 11/07/2020   MCV 99.3 11/07/2020   PLT 184 11/07/2020    No results found for: LABCA2  No components found for: [id number]  No results for input(s): INR in the last 168 hours.  No results found for: LABCA2  No results found for: OMB559  No results found for: RCB638  No results found for: GTX646  No results found for: CA2729  No components found for: HGQUANT  No results found for: CEA1 / No results found for: CEA1   No results found for: AFPTUMOR  No results found for: CHROMOGRNA  No results found for: KPAFRELGTCHN, LAMBDASER, KAPLAMBRATIO (kappa/lambda light chains)  No results found for: HGBA, HGBA2QUANT, HGBFQUANT, HGBSQUAN (Hemoglobinopathy evaluation)   No  results found for: LDH  Lab Results  Component Value Date   IRON 88 10/29/2018   IRON 88 10/29/2018   TIBC 241 (L) 10/29/2018   TIBC 241 (L) 10/29/2018   IRONPCTSAT 37 10/29/2018   IRONPCTSAT 37 10/29/2018   (Iron and TIBC)  Lab Results  Component Value Date   FERRITIN 43 10/29/2018   FERRITIN 43 10/29/2018    Urinalysis    Component Value Date/Time    APPEARANCEUR Clear 08/08/2020 1359   GLUCOSEU Negative 08/08/2020 1359   BILIRUBINUR Negative 08/08/2020 1359   PROTEINUR Negative 08/08/2020 1359   NITRITE Negative 08/08/2020 1359   LEUKOCYTESUR Negative 08/08/2020 1359    STUDIES: MR LIVER W WO CONTRAST  Result Date: 11/07/2020 CLINICAL DATA:  Breast cancer metastatic to the liver, currently undergoing chemotherapy, restaging assessment. EXAM: MRI ABDOMEN WITHOUT AND WITH CONTRAST TECHNIQUE: Multiplanar multisequence MR imaging of the abdomen was performed both before and after the administration of intravenous contrast. CONTRAST:  67m MULTIHANCE GADOBENATE DIMEGLUMINE 529 MG/ML IV SOLN COMPARISON:  08/24/2020 FINDINGS: Lower chest: Unremarkable Hepatobiliary: Small dependent gallstone in the gallbladder on image 25 of series 5, about 0.3 cm in diameter. No biliary dilatation. About 14 mostly small rim enhancing liver lesions are present. One of the larger lesions is in the left hepatic lobe on image 18 of series 10 and measures 0.8 by 0.8 cm, previously 1.1 by 0.9 cm by my measurements. Another lesion is present in the caudate lobe and measures 1.7 by 1.5 cm on image 25 of series 10, previously 2.6 by 2.1 cm by my measurements. Most of the remaining lesions are substantially smaller than these 2 index lesions, and roughly similar to the prior exam. There is some transient hepatic attenuation difference on early arterial phase images in segment 6. Pancreas:  Unremarkable Spleen:  Unremarkable Adrenals/Urinary Tract:  Unremarkable Stomach/Bowel: Prominent stool throughout the colon favors constipation. Vascular/Lymphatic:  Unremarkable Other:  No supplemental non-categorized findings. Musculoskeletal: Unremarkable IMPRESSION: 1. About 14 mostly small rim enhancing liver lesions are present. The two dominant index lesions are moderately smaller than on 08/24/2020, with the remaining small lesions roughly stable. 2. Prominent stool throughout the colon  favors constipation. 3. Small dependent gallstone in the gallbladder. Electronically Signed   By: WVan ClinesM.D.   On: 11/07/2020 08:33     ELIGIBLE FOR AVAILABLE RESEARCH PROTOCOL: AET  ASSESSMENT: 55y.o. Erica Wood woman status post right breast lower outer quadrant biopsy 08/04/2020 for a clinical T2 N0 M1, stage IV invasive ductal carcinoma, grade 3, triple positive, with an MIB-1 of 40%  (a) breast MRI 08/14/2020 shows 3 additional right breast lesions and heterogeneous enhancement of the sternum  (b) biopsy of the additional right breast mass 09/05/2020 showed atypical lobular hyperplasia (concordant).  (c) CT scan of the abdomen and pelvis 07/14/2020 shows 2 indeterminate liver lesions  (d) abdominal MRI 08/24/2020 shows multiple liver lesions highly suspicious for liver metastases.  (e) liver biopsy 09/20/2020 positive for carcinoma, prognostic panel estrogen and progesterone receptor positive, but HER-2 negative; MIB-130%  (f) bone scan 09/14/2020 shows no evidence of bone metastases  (g) CT scan of the chest and head 09/14/2020 showed no evidence of metastatic disease in the chest   (1) neoadjuvant chemotherapy to consist of docetaxel, carboplatin, trastuzumab and pertuzumab every 21 days x 6 started 08/31/2020  (2) trastuzumab and pertuzumab to continue indefinitely  (a) echo 04/12/2020 shows an ejection fraction in the 60-65% range  (b) echo 09/25/2020 shows an ejection fraction in  the 60 to 65% range.  (3) definitive surgery to follow  (4) adjuvant radiation as appropriate  (5) antiestrogens to start at the completion of local treatment   PLAN: Bryley will receive fluids today and then she will be set up for chemotherapy next Thursday, a week from today, on 11/23/2020.  She will have her final cycle 3 weeks after that on 12/14/2020.  After that she will be ready for her breast surgery.  I am setting her up for a breast MRI on 12/21/2020 and then she will follow-up  with Dr. Donne Hazel with that information in hand.  We reviewed the MRI of the liver today.  It shows response which is very favorable.  Recall the liver lesions tested HER2 negative.  There were however estrogen receptor positive.  At the completion of her chemotherapy she will be started on antiestrogens and CDK 4, 6 inhibitors.  Hopefully we will not have to continue chemotherapy in the future.  The anti-HER2 treatment of course will be continued indefinitely.  Since she is now suffering from severe constipation we are resuming the pertuzumab with today's treatment. She will need a repeat echocardiogram in mid February.  Her disability papers need to be extended.  At this point she may be able to return to work the first week in April.  She knows to call for any other issue that may develop before the next visit  Total encounter time 45 minutes.Sarajane Jews C. Genetta Fiero, MD 11/16/2020 8:47 AM Medical Oncology and Hematology [facility] [place],  [address] Tel. [phone]    Fax. [phone]   This document serves as a record of services personally performed by Lurline Del, MD. It was created on his behalf by Wilburn Mylar, a trained medical scribe. The creation of this record is based on the scribe's personal observations and the provider's statements to them.   I, Lurline Del MD, have reviewed the above documentation for accuracy and completeness, and I agree with the above.   *Total Encounter Time as defined by the Centers for Medicare and Medicaid Services includes, in addition to the face-to-face time of a patient visit (documented in the note above) non-face-to-face time: obtaining and reviewing outside history, ordering and reviewing medications, tests or procedures, care coordination (communications with other health care professionals or caregivers) and documentation in the medical record.

## 2020-11-16 ENCOUNTER — Other Ambulatory Visit: Payer: 59

## 2020-11-16 ENCOUNTER — Telehealth: Payer: Self-pay | Admitting: Hematology

## 2020-11-16 ENCOUNTER — Inpatient Hospital Stay: Payer: 59

## 2020-11-16 ENCOUNTER — Inpatient Hospital Stay: Payer: 59 | Admitting: Oncology

## 2020-11-16 ENCOUNTER — Telehealth: Payer: Self-pay | Admitting: Adult Health

## 2020-11-16 ENCOUNTER — Other Ambulatory Visit: Payer: Self-pay

## 2020-11-16 ENCOUNTER — Other Ambulatory Visit: Payer: Self-pay | Admitting: *Deleted

## 2020-11-16 ENCOUNTER — Encounter: Payer: Self-pay | Admitting: *Deleted

## 2020-11-16 VITALS — BP 115/68 | HR 78 | Temp 97.8°F | Resp 18 | Ht 64.0 in | Wt 111.5 lb

## 2020-11-16 VITALS — BP 109/70 | HR 70 | Resp 16

## 2020-11-16 DIAGNOSIS — Z5112 Encounter for antineoplastic immunotherapy: Secondary | ICD-10-CM | POA: Diagnosis not present

## 2020-11-16 DIAGNOSIS — I2699 Other pulmonary embolism without acute cor pulmonale: Secondary | ICD-10-CM

## 2020-11-16 DIAGNOSIS — Z86718 Personal history of other venous thrombosis and embolism: Secondary | ICD-10-CM

## 2020-11-16 DIAGNOSIS — C50511 Malignant neoplasm of lower-outer quadrant of right female breast: Secondary | ICD-10-CM | POA: Diagnosis not present

## 2020-11-16 DIAGNOSIS — Z170 Estrogen receptor positive status [ER+]: Secondary | ICD-10-CM

## 2020-11-16 DIAGNOSIS — Z86711 Personal history of pulmonary embolism: Secondary | ICD-10-CM

## 2020-11-16 DIAGNOSIS — Z95828 Presence of other vascular implants and grafts: Secondary | ICD-10-CM

## 2020-11-16 LAB — CBC WITH DIFFERENTIAL/PLATELET
Abs Immature Granulocytes: 0.12 10*3/uL — ABNORMAL HIGH (ref 0.00–0.07)
Basophils Absolute: 0.1 10*3/uL (ref 0.0–0.1)
Basophils Relative: 1 %
Eosinophils Absolute: 0 10*3/uL (ref 0.0–0.5)
Eosinophils Relative: 0 %
HCT: 31.3 % — ABNORMAL LOW (ref 36.0–46.0)
Hemoglobin: 10 g/dL — ABNORMAL LOW (ref 12.0–15.0)
Immature Granulocytes: 1 %
Lymphocytes Relative: 13 %
Lymphs Abs: 1.5 10*3/uL (ref 0.7–4.0)
MCH: 32.1 pg (ref 26.0–34.0)
MCHC: 31.9 g/dL (ref 30.0–36.0)
MCV: 100.3 fL — ABNORMAL HIGH (ref 80.0–100.0)
Monocytes Absolute: 0.5 10*3/uL (ref 0.1–1.0)
Monocytes Relative: 4 %
Neutro Abs: 9.7 10*3/uL — ABNORMAL HIGH (ref 1.7–7.7)
Neutrophils Relative %: 81 %
Platelets: 110 10*3/uL — ABNORMAL LOW (ref 150–400)
RBC: 3.12 MIL/uL — ABNORMAL LOW (ref 3.87–5.11)
RDW: 15.1 % (ref 11.5–15.5)
WBC: 11.8 10*3/uL — ABNORMAL HIGH (ref 4.0–10.5)
nRBC: 0 % (ref 0.0–0.2)

## 2020-11-16 LAB — COMPREHENSIVE METABOLIC PANEL
ALT: 18 U/L (ref 0–44)
AST: 19 U/L (ref 15–41)
Albumin: 3.3 g/dL — ABNORMAL LOW (ref 3.5–5.0)
Alkaline Phosphatase: 87 U/L (ref 38–126)
Anion gap: 6 (ref 5–15)
BUN: 5 mg/dL — ABNORMAL LOW (ref 6–20)
CO2: 29 mmol/L (ref 22–32)
Calcium: 8.6 mg/dL — ABNORMAL LOW (ref 8.9–10.3)
Chloride: 104 mmol/L (ref 98–111)
Creatinine, Ser: 0.78 mg/dL (ref 0.44–1.00)
GFR, Estimated: >60 mL/min (ref >60)
Glucose, Bld: 96 mg/dL (ref 70–99)
Potassium: 4 mmol/L (ref 3.5–5.1)
Sodium: 139 mmol/L (ref 135–145)
Total Bilirubin: 0.3 mg/dL (ref 0.3–1.2)
Total Protein: 5.6 g/dL — ABNORMAL LOW (ref 6.5–8.1)

## 2020-11-16 LAB — PROTIME-INR
INR: 8 (ref 0.8–1.2)
Prothrombin Time: 64.6 seconds — ABNORMAL HIGH (ref 11.4–15.2)

## 2020-11-16 MED ORDER — SODIUM CHLORIDE 0.9% FLUSH
10.0000 mL | Freq: Once | INTRAVENOUS | Status: AC
  Administered 2020-11-16: 10 mL
  Filled 2020-11-16: qty 10

## 2020-11-16 MED ORDER — HEPARIN SOD (PORK) LOCK FLUSH 100 UNIT/ML IV SOLN
500.0000 [IU] | Freq: Once | INTRAVENOUS | Status: AC
Start: 2020-11-16 — End: 2020-11-16
  Administered 2020-11-16: 500 [IU]
  Filled 2020-11-16: qty 5

## 2020-11-16 MED ORDER — SODIUM CHLORIDE 0.9 % IV SOLN
INTRAVENOUS | Status: DC
  Filled 2020-11-16 (×2): qty 250

## 2020-11-16 NOTE — Telephone Encounter (Signed)
Scheduled appts per 1/6 los. Gave pt a print out of appt calendar.

## 2020-11-16 NOTE — Patient Instructions (Signed)

## 2020-11-17 ENCOUNTER — Other Ambulatory Visit: Payer: Self-pay | Admitting: Oncology

## 2020-11-17 NOTE — Progress Notes (Signed)
I called Erica Wood to let her know her INR was very high.  She is stopping the warfarin.  We are rechecking labs 11/21/2020.  She is not having any bleeding issues at this point.

## 2020-11-21 ENCOUNTER — Inpatient Hospital Stay: Payer: 59

## 2020-11-21 ENCOUNTER — Other Ambulatory Visit: Payer: Self-pay

## 2020-11-21 ENCOUNTER — Other Ambulatory Visit: Payer: Self-pay | Admitting: *Deleted

## 2020-11-21 DIAGNOSIS — Z170 Estrogen receptor positive status [ER+]: Secondary | ICD-10-CM

## 2020-11-21 DIAGNOSIS — Z86718 Personal history of other venous thrombosis and embolism: Secondary | ICD-10-CM

## 2020-11-21 DIAGNOSIS — Z5112 Encounter for antineoplastic immunotherapy: Secondary | ICD-10-CM | POA: Diagnosis not present

## 2020-11-21 DIAGNOSIS — C50511 Malignant neoplasm of lower-outer quadrant of right female breast: Secondary | ICD-10-CM

## 2020-11-21 LAB — COMPREHENSIVE METABOLIC PANEL
ALT: 15 U/L (ref 0–44)
AST: 18 U/L (ref 15–41)
Albumin: 3.6 g/dL (ref 3.5–5.0)
Alkaline Phosphatase: 69 U/L (ref 38–126)
Anion gap: 6 (ref 5–15)
BUN: 7 mg/dL (ref 6–20)
CO2: 27 mmol/L (ref 22–32)
Calcium: 8.9 mg/dL (ref 8.9–10.3)
Chloride: 103 mmol/L (ref 98–111)
Creatinine, Ser: 0.8 mg/dL (ref 0.44–1.00)
GFR, Estimated: >60 mL/min (ref >60)
Glucose, Bld: 98 mg/dL (ref 70–99)
Potassium: 3.8 mmol/L (ref 3.5–5.1)
Sodium: 136 mmol/L (ref 135–145)
Total Bilirubin: 0.3 mg/dL (ref 0.3–1.2)
Total Protein: 6 g/dL — ABNORMAL LOW (ref 6.5–8.1)

## 2020-11-21 LAB — CBC WITH DIFFERENTIAL/PLATELET
Abs Immature Granulocytes: 0.02 10*3/uL (ref 0.00–0.07)
Basophils Absolute: 0.1 10*3/uL (ref 0.0–0.1)
Basophils Relative: 1 %
Eosinophils Absolute: 0 10*3/uL (ref 0.0–0.5)
Eosinophils Relative: 0 %
HCT: 32 % — ABNORMAL LOW (ref 36.0–46.0)
Hemoglobin: 10.1 g/dL — ABNORMAL LOW (ref 12.0–15.0)
Immature Granulocytes: 0 %
Lymphocytes Relative: 24 %
Lymphs Abs: 1.5 10*3/uL (ref 0.7–4.0)
MCH: 32 pg (ref 26.0–34.0)
MCHC: 31.6 g/dL (ref 30.0–36.0)
MCV: 101.3 fL — ABNORMAL HIGH (ref 80.0–100.0)
Monocytes Absolute: 0.5 10*3/uL (ref 0.1–1.0)
Monocytes Relative: 8 %
Neutro Abs: 4.1 10*3/uL (ref 1.7–7.7)
Neutrophils Relative %: 67 %
Platelets: 214 10*3/uL (ref 150–400)
RBC: 3.16 MIL/uL — ABNORMAL LOW (ref 3.87–5.11)
RDW: 15 % (ref 11.5–15.5)
WBC: 6.2 10*3/uL (ref 4.0–10.5)
nRBC: 0 % (ref 0.0–0.2)

## 2020-11-21 LAB — PROTIME-INR
INR: 1.1 (ref 0.8–1.2)
Prothrombin Time: 14.2 seconds (ref 11.4–15.2)

## 2020-11-24 ENCOUNTER — Inpatient Hospital Stay: Payer: 59 | Admitting: Adult Health

## 2020-11-24 ENCOUNTER — Inpatient Hospital Stay: Payer: 59

## 2020-11-24 ENCOUNTER — Other Ambulatory Visit: Payer: Self-pay

## 2020-11-24 ENCOUNTER — Other Ambulatory Visit: Payer: Self-pay | Admitting: Oncology

## 2020-11-24 ENCOUNTER — Encounter: Payer: Self-pay | Admitting: Oncology

## 2020-11-24 ENCOUNTER — Encounter: Payer: Self-pay | Admitting: Adult Health

## 2020-11-24 VITALS — BP 109/63 | HR 78 | Temp 99.1°F | Resp 16 | Ht 64.0 in | Wt 113.1 lb

## 2020-11-24 DIAGNOSIS — C50511 Malignant neoplasm of lower-outer quadrant of right female breast: Secondary | ICD-10-CM

## 2020-11-24 DIAGNOSIS — Z170 Estrogen receptor positive status [ER+]: Secondary | ICD-10-CM

## 2020-11-24 DIAGNOSIS — Z5112 Encounter for antineoplastic immunotherapy: Secondary | ICD-10-CM | POA: Diagnosis not present

## 2020-11-24 DIAGNOSIS — Z95828 Presence of other vascular implants and grafts: Secondary | ICD-10-CM

## 2020-11-24 LAB — CBC WITH DIFFERENTIAL/PLATELET
Abs Immature Granulocytes: 0.03 10*3/uL (ref 0.00–0.07)
Basophils Absolute: 0 10*3/uL (ref 0.0–0.1)
Basophils Relative: 1 %
Eosinophils Absolute: 0 10*3/uL (ref 0.0–0.5)
Eosinophils Relative: 0 %
HCT: 30.1 % — ABNORMAL LOW (ref 36.0–46.0)
Hemoglobin: 9.7 g/dL — ABNORMAL LOW (ref 12.0–15.0)
Immature Granulocytes: 0 %
Lymphocytes Relative: 24 %
Lymphs Abs: 2.1 10*3/uL (ref 0.7–4.0)
MCH: 32.8 pg (ref 26.0–34.0)
MCHC: 32.2 g/dL (ref 30.0–36.0)
MCV: 101.7 fL — ABNORMAL HIGH (ref 80.0–100.0)
Monocytes Absolute: 0.7 10*3/uL (ref 0.1–1.0)
Monocytes Relative: 9 %
Neutro Abs: 5.7 10*3/uL (ref 1.7–7.7)
Neutrophils Relative %: 66 %
Platelets: 278 10*3/uL (ref 150–400)
RBC: 2.96 MIL/uL — ABNORMAL LOW (ref 3.87–5.11)
RDW: 14.8 % (ref 11.5–15.5)
WBC: 8.6 10*3/uL (ref 4.0–10.5)
nRBC: 0 % (ref 0.0–0.2)

## 2020-11-24 LAB — COMPREHENSIVE METABOLIC PANEL
ALT: 13 U/L (ref 0–44)
AST: 16 U/L (ref 15–41)
Albumin: 3.6 g/dL (ref 3.5–5.0)
Alkaline Phosphatase: 66 U/L (ref 38–126)
Anion gap: 7 (ref 5–15)
BUN: 17 mg/dL (ref 6–20)
CO2: 29 mmol/L (ref 22–32)
Calcium: 9 mg/dL (ref 8.9–10.3)
Chloride: 100 mmol/L (ref 98–111)
Creatinine, Ser: 0.79 mg/dL (ref 0.44–1.00)
GFR, Estimated: >60 mL/min (ref >60)
Glucose, Bld: 122 mg/dL — ABNORMAL HIGH (ref 70–99)
Potassium: 3.6 mmol/L (ref 3.5–5.1)
Sodium: 136 mmol/L (ref 135–145)
Total Bilirubin: 0.2 mg/dL — ABNORMAL LOW (ref 0.3–1.2)
Total Protein: 6 g/dL — ABNORMAL LOW (ref 6.5–8.1)

## 2020-11-24 LAB — PROTIME-INR
INR: 1.5 — ABNORMAL HIGH (ref 0.8–1.2)
Prothrombin Time: 17.2 seconds — ABNORMAL HIGH (ref 11.4–15.2)

## 2020-11-24 LAB — PREGNANCY, URINE: Preg Test, Ur: NEGATIVE

## 2020-11-24 MED ORDER — SODIUM CHLORIDE 0.9 % IV SOLN
10.0000 mg | Freq: Once | INTRAVENOUS | Status: AC
  Administered 2020-11-24: 10 mg via INTRAVENOUS
  Filled 2020-11-24: qty 10

## 2020-11-24 MED ORDER — HEPARIN SOD (PORK) LOCK FLUSH 100 UNIT/ML IV SOLN
500.0000 [IU] | Freq: Once | INTRAVENOUS | Status: AC | PRN
  Administered 2020-11-24: 500 [IU]
  Filled 2020-11-24: qty 5

## 2020-11-24 MED ORDER — SODIUM CHLORIDE 0.9 % IV SOLN
75.0000 mg/m2 | Freq: Once | INTRAVENOUS | Status: AC
  Administered 2020-11-24: 110 mg via INTRAVENOUS
  Filled 2020-11-24: qty 11

## 2020-11-24 MED ORDER — DIPHENHYDRAMINE HCL 25 MG PO CAPS
ORAL_CAPSULE | ORAL | Status: AC
  Filled 2020-11-24: qty 1

## 2020-11-24 MED ORDER — SODIUM CHLORIDE 0.9 % IV SOLN
150.0000 mg | Freq: Once | INTRAVENOUS | Status: AC
  Administered 2020-11-24: 150 mg via INTRAVENOUS
  Filled 2020-11-24: qty 150

## 2020-11-24 MED ORDER — ACETAMINOPHEN 325 MG PO TABS
650.0000 mg | ORAL_TABLET | Freq: Once | ORAL | Status: AC
  Administered 2020-11-24: 650 mg via ORAL

## 2020-11-24 MED ORDER — SODIUM CHLORIDE 0.9% FLUSH
10.0000 mL | Freq: Once | INTRAVENOUS | Status: AC
  Administered 2020-11-24: 10 mL
  Filled 2020-11-24: qty 10

## 2020-11-24 MED ORDER — LORAZEPAM 2 MG/ML IJ SOLN
INTRAMUSCULAR | Status: AC
  Filled 2020-11-24: qty 1

## 2020-11-24 MED ORDER — SODIUM CHLORIDE 0.9 % IV SOLN
16.0000 mg | Freq: Once | INTRAVENOUS | Status: AC
  Administered 2020-11-24: 16 mg via INTRAVENOUS
  Filled 2020-11-24: qty 8

## 2020-11-24 MED ORDER — SODIUM CHLORIDE 0.9 % IV SOLN
449.5000 mg | Freq: Once | INTRAVENOUS | Status: AC
  Administered 2020-11-24: 450 mg via INTRAVENOUS
  Filled 2020-11-24: qty 45

## 2020-11-24 MED ORDER — ACETAMINOPHEN 325 MG PO TABS
ORAL_TABLET | ORAL | Status: AC
  Filled 2020-11-24: qty 2

## 2020-11-24 MED ORDER — DIPHENHYDRAMINE HCL 25 MG PO CAPS
25.0000 mg | ORAL_CAPSULE | Freq: Once | ORAL | Status: AC
  Administered 2020-11-24: 25 mg via ORAL

## 2020-11-24 MED ORDER — SODIUM CHLORIDE 0.9 % IV SOLN
420.0000 mg | Freq: Once | INTRAVENOUS | Status: DC
  Filled 2020-11-24: qty 14

## 2020-11-24 MED ORDER — SODIUM CHLORIDE 0.9 % IV SOLN
Freq: Once | INTRAVENOUS | Status: AC
  Filled 2020-11-24: qty 250

## 2020-11-24 MED ORDER — SODIUM CHLORIDE 0.9% FLUSH
10.0000 mL | INTRAVENOUS | Status: DC | PRN
  Administered 2020-11-24: 10 mL
  Filled 2020-11-24: qty 10

## 2020-11-24 MED ORDER — SODIUM CHLORIDE 0.9 % IV SOLN
300.0000 mg | Freq: Once | INTRAVENOUS | Status: AC
  Administered 2020-11-24: 300 mg via INTRAVENOUS
  Filled 2020-11-24: qty 14.29

## 2020-11-24 MED ORDER — LORAZEPAM 2 MG/ML IJ SOLN
0.5000 mg | Freq: Once | INTRAMUSCULAR | Status: AC
  Administered 2020-11-24: 0.5 mg via INTRAVENOUS

## 2020-11-24 NOTE — Progress Notes (Signed)
I called Erica Wood regarding her very low INR.  Her INR previously had been very high.  We asked her to stay off the medication.  She has been off the Coumadin at the time the most recent INR was checked.  She is now back on 5 mg daily.  Of course we are checking this at the next visit

## 2020-11-24 NOTE — Progress Notes (Signed)
[place]  Telephone:([phone]) [phone] Fax:(336) [phone]     ID: Erica Wood DOB: [birth year]  MR#: [id number]  YKD#:[id number]  Patient Care Team: Eunice Blase, MD as PCP - General (Family Medicine) Prudence Davidson, Hardy Bing, MD as Referring Physician (Oncology) Rockwell Germany, RN as Oncology Nurse Navigator Mauro Kaufmann, RN as Oncology Nurse Navigator Rolm Bookbinder, MD as Consulting Physician (General Surgery) Magrinat, Virgie Dad, MD as Consulting Physician (Oncology) Kyung Rudd, MD as Consulting Physician (Radiation Oncology) Bo Merino, MD as Consulting Physician (Rheumatology) Olga Millers, MD as Consulting Physician (Obstetrics and Gynecology) Larey Dresser, MD as Consulting Physician (Cardiology) Scot Dock, NP OTHER MD:  CHIEF COMPLAINT: triple positive breast cancer  CURRENT TREATMENT: Neoadjuvant therapy    INTERVAL HISTORY: Erica Wood returns today for follow up and treatment of her triple positive breast cancer.   She began neoadjuvant chemotherapy, consisting of docetaxel, carboplatin, trastuzumab, and pertuzumab every 21 days x6, on 08/31/2020.  She is doing well today.  She is here to proceed with cycle 5 of her treatment.  She has not received pertuzumab secondary to rash/diarrhea.     REVIEW OF SYSTEMS: Erica Wood's rash has resolved.  She is doing well and denies peripheral neuropathy.  She has no bowel/bladder changes, cough, shortness of breath, headaches, nausea, vomiting, or any other concerns.  A detailed ROS was otherwise non contributory.     COVID 19 VACCINATION STATUS: Status post [employer].   HISTORY OF CURRENT ILLNESS: From the original intake note:  Erica Wood herself palpated an abnormality in the right breast several years ago.  This had not changed until sometime in June 2021.  She was evaluated by Dr. Ouida Sills and underwent right diagnostic mammography with tomography and right breast ultrasonography at [facility] on 07/26/2020 showing: breast density category D; 2.6 cm mass in right breast at 6 o'clock, at site of palpable concern; two focal groups of punctate calcifications posterior to palpable mass; no right axillary adenopathy.  Accordingly on 08/04/2020 she proceeded to biopsy of the right breast area in question. The pathology from this procedure ([id number]) showed: invasive ductal carcinoma, grade 3. Prognostic indicators significant for: estrogen receptor, 95% positive with moderate staining intensity and progesterone receptor, 10% positive with strong staining intensity. Proliferation marker Ki67 at 40%. HER2 equivocal by immunohistochemistry (2+), but positive by fluorescent in situ hybridization with a signals ratio 3.23 and number per cell 6.45.  Biopsy of the posterior calcifications was benign.  She also underwent breast MRI on 08/11/2020 showing: breast composition D; 2.6 cm biopsy-proven malignancy in right breast at 6 o'clock, with probable skin involvement noted on previous ultrasound; additional enhancing masses within right breast-- 5 mm in lower-outer, 1.2 cm in upper-inner, 7 mm in upper-outer; no evidence of left breast malignancy or lymphadenopathy; heterogeneous enhancement of sternum, of uncertain significance.  The patient's subsequent history is as detailed below.   PAST MEDICAL HISTORY: Past Medical History:  Diagnosis Date  . Anti-cardiolipin antibody positive   . Anti-cardiolipin antibody syndrome (HCC)   . Anxiety   . Arthralgia   . Arthritis    bil knees, hands  . Atypical chest pain   . Autoimmune disease ([place])   . Cancer ([place]) 07/2020   right breast IDC  . Chronic fatigue   . Depression   . DVT (deep venous thrombosis) ([place])   . IBS (irritable bowel syndrome)    with constipation  . Lupus ([place])   . Pulmonary embolus ([place])   .  Raynaud's syndrome   . Sicca ([place])   . Thrombocytopenia ([place])     PAST SURGICAL HISTORY: Past Surgical History:  Procedure  Laterality Date  . ABLATION  12/2018   leg veins  . ABLATION Right 08/2019   venous leg   . APPENDECTOMY    . COLON SURGERY     Perforated colon repair after colostomy  . GANGLION CYST EXCISION    . HYSTEROSCOPY WITH D & C N/A 01/13/2014   Procedure: DILATATION AND CURETTAGE /HYSTEROSCOPY with resection ;  Surgeon: Olga Millers, MD;  Location: [facility];  Service: Gynecology;  Laterality: N/A;  . LAPAROTOMY    . PORTACATH PLACEMENT N/A 09/13/2020   Procedure: INSERTION PORT-A-CATH WITH ULTRASOUND GUIDANCE;  Surgeon: Rolm Bookbinder, MD;  Location: [facility];  Service: General;  Laterality: N/A;    FAMILY HISTORY: Family History  Problem Relation Age of Onset  . Rheum arthritis Father   . Hemachromatosis Father   . Cancer Sister        bone   as of October 2021 her father isage 13 and her mother age 65.. The patient had one sister (and no brothers). The sister was diagnosed with Ewing's sarcoma at age 9, and died from the disease after many difficult treatments.    GYNECOLOGIC HISTORY:  No LMP recorded. (Menstrual status: Irregular Periods).  Menarche: 58 or 55 years old GX P 0 LMP around 2019 Contraceptive: yes HRT no  Hysterectomy? no BSO? 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 She is widowed. She lives at home with her cat, Mariea Clonts. She is not a Ambulance person.    ADVANCED DIRECTIVES: not in place; at the 08/16/2020 visit the patient was given the appropriate documents to complete and notarized at her discretion.   HEALTH MAINTENANCE: Social History   Tobacco Use  . Smoking status: Former Smoker    Packs/day: 0.50    Years: 5.00    Pack years: 2.50    Types: Cigarettes    Start date: 08/06/2006    Quit date: 08/07/2011    Years since quitting: 9.3  . Smokeless tobacco: Never Used  Vaping Use  . Vaping Use: Never used  Substance Use Topics  . Alcohol use: Yes    Comment: occasionally  .  Drug use: No     Colonoscopy: 2002 for perforated bowel  PAP: 03/2020  Bone density: done at Dr. [facility] office   Allergies  Allergen Reactions  . Beef-Derived Products Hives  . Latex Rash    Contact  . Sulfa Antibiotics Rash    Current Outpatient Medications  Medication Sig Dispense Refill  . acetaminophen (TYLENOL) 500 MG tablet Take 1,000 mg by mouth every 6 (six) hours as needed for moderate pain or headache.    . amphetamine-dextroamphetamine (ADDERALL XR) 20 MG 24 hr capsule Take 1 capsule (20 mg total) by mouth daily. 30 capsule 0  . Ascorbic Acid (VITAMIN C PO) Take 3,000 mg by mouth daily.     Marland Kitchen CAMILA 0.35 MG tablet Take 1 tablet by mouth daily.  11  . Cholecalciferol (VITAMIN D3) 75 MCG (3000 UT) TABS Take 3,000 Units by mouth daily.    Marland Kitchen dexamethasone (DECADRON) 4 MG tablet TAKE 2 TABLETS(8 MG) BY MOUTH TWICE DAILY. START THE DAY BEFORE TAXOTERE. THEN TAKE DAILY FOR 3 DAYS AFTER CHEMOTHERAPY 30 tablet 1  . Docusate Sodium (COLACE PO) Take 2 tablets by mouth 2 (two) times daily.    Marland Kitchen  enoxaparin (LOVENOX) 30 MG/0.3ML injection Inject 0.3 mLs (30 mg total) into the skin daily. 6 mL 1  . fluconazole (DIFLUCAN) 100 MG tablet TAKE AS DIRECTED 30 tablet 0  . Fluocinonide 0.1 % CREA Apply 1 application topically 2 (two) times daily. 240 g 2  . hydroxychloroquine (PLAQUENIL) 200 MG tablet Take one tablet by mouth twice daily Monday through Friday. 120 tablet 0  . lidocaine-prilocaine (EMLA) cream Apply to affected area once (Patient taking differently: Apply 1 application topically daily as needed (port access).) 30 g 3  . loratadine (CLARITIN) 10 MG tablet Take 10 mg by mouth See admin instructions. Take 10 mg daily for 5 days after chemo treatments    . LORazepam (ATIVAN) 0.5 MG tablet TAKE 1 TABLET(0.5 MG) BY MOUTH AT BEDTIME AS NEEDED FOR NAUSEA OR VOMITING 30 tablet 0  . magic mouthwash SOLN Take 5 mLs by mouth 4 (four) times daily as needed for mouth pain. 240 mL 0  .  metoCLOPramide (REGLAN) 10 MG tablet Take 1 tablet (10 mg total) by mouth every 6 (six) hours as needed for nausea. 40 tablet 2  . metroNIDAZOLE (METROGEL) 1 % gel Apply topically daily. 45 g 0  . oxyCODONE (OXY IR/ROXICODONE) 5 MG immediate release tablet Take 1 tablet (5 mg total) by mouth every 6 (six) hours as needed. 6 tablet 0  . Pegfilgrastim (NEULASTA [place]) Inject 1 Dose into the skin See admin instructions. Inject 24 to 72 hours after chemo treatment    . polyethylene glycol (MIRALAX / GLYCOLAX) 17 g packet Take 17 g by mouth daily.    . prochlorperazine (COMPAZINE) 10 MG tablet Take 0.5-1 tablets (5-10 mg total) by mouth 4 (four) times daily -  before meals and at bedtime. Start the evening of chemotherapy and take for 2 days, then take as needed 30 tablet 1  . Rivaroxaban (XARELTO) 15 MG TABS tablet Take 1 tablet (15 mg total) by mouth daily. 30 tablet 3  . spironolactone (ALDACTONE) 25 MG tablet Take 25 mg by mouth daily as needed (swelling).    . traMADol (ULTRAM) 50 MG tablet Take 1-2 tablets (50-100 mg total) by mouth every 6 (six) hours as needed. (Patient taking differently: Take 50-100 mg by mouth every 6 (six) hours as needed for moderate pain.) 60 tablet 0  . tretinoin (RETIN-A) 0.1 % cream Apply 1 application topically at bedtime as needed (acne).     . triamcinolone lotion (KENALOG) 0.1 % Apply 1 application topically 3 (three) times daily. 120 mL 2  . triamterene-hydrochlorothiazide (DYAZIDE) 37.5-25 MG capsule TAKE 1 CAPSULE BY MOUTH DAILY AS NEEDED FOR SWELLING 90 capsule 0  . UNABLE TO FIND Green Vibrance - 1 scoop daily    . warfarin (COUMADIN) 5 MG tablet Take 1 tablet (5 mg total) by mouth daily. 30 tablet 11   No current facility-administered medications for this visit.    OBJECTIVE: White woman who appears younger than stated age  52:   11/24/20 1146  BP: 109/63  Pulse: 78  Resp: 16  Temp: 99.1 F (37.3 C)  SpO2: 99%     Body mass index is 19.41 kg/m.    Wt Readings from Last 3 Encounters:  11/24/20 113 lb 1.6 oz (51.3 kg)  11/16/20 111 lb 8 oz (50.6 kg)  11/06/20 110 lb 1.6 oz (49.9 kg)      ECOG FS:2 - Symptomatic, <50% confined to bed GENERAL: Patient is a well appearing female in no acute distress HEENT:  Sclerae anicteric. Mask in place. Neck is supple.  NODES:  No cervical, supraclavicular, or axillary lymphadenopathy palpated.  BREAST EXAM:  Deferred. LUNGS:  Clear to auscultation bilaterally.  No wheezes or rhonchi. HEART:  Regular rate and rhythm. No murmur appreciated. ABDOMEN:  Soft, nontender.  Positive, normoactive bowel sounds. No organomegaly palpated. MSK:  No focal spinal tenderness to palpation.  EXTREMITIES:  No peripheral edema.   SKIN:  Clear with no obvious rashes or skin changes. No nail dyscrasia. NEURO:  Nonfocal. Well oriented.  Appropriate affect.     LAB RESULTS:  CMP     Component Value Date/Time   NA 136 11/24/2020 1125   NA 137 07/28/2020 1108   K 3.6 11/24/2020 1125   CL 100 11/24/2020 1125   CO2 29 11/24/2020 1125   GLUCOSE 122 (H) 11/24/2020 1125   BUN 17 11/24/2020 1125   BUN 8 07/28/2020 1108   CREATININE 0.79 11/24/2020 1125   CREATININE 0.78 10/20/2020 1330   CALCIUM 9.0 11/24/2020 1125   PROT 6.0 (L) 11/24/2020 1125   PROT 7.3 07/28/2020 1108   ALBUMIN 3.6 11/24/2020 1125   ALBUMIN 4.9 07/28/2020 1108   AST 16 11/24/2020 1125   AST 19 09/21/2020 0827   ALT 13 11/24/2020 1125   ALT 27 09/21/2020 0827   ALKPHOS 66 11/24/2020 1125   BILITOT 0.2 (L) 11/24/2020 1125   BILITOT 0.4 09/21/2020 0827   GFRNONAA >60 11/24/2020 1125   GFRNONAA >60 10/20/2020 1330   GFRAA 95 07/28/2020 1108    No results found for: TOTALPROTELP, ALBUMINELP, A1GS, A2GS, BETS, BETA2SER, GAMS, MSPIKE, SPEI  Lab Results  Component Value Date   WBC 8.6 11/24/2020   NEUTROABS 5.7 11/24/2020   HGB 9.7 (L) 11/24/2020   HCT 30.1 (L) 11/24/2020   MCV 101.7 (H) 11/24/2020   PLT 278 11/24/2020    No  results found for: LABCA2  No components found for: XBLTJQ300  Recent Labs  Lab 11/21/20 1030  INR 1.1    No results found for: LABCA2  No results found for: PQZ300  No results found for: TMA263  No results found for: FHL456  No results found for: CA2729  No components found for: HGQUANT  No results found for: CEA1 / No results found for: CEA1   No results found for: AFPTUMOR  No results found for: CHROMOGRNA  No results found for: KPAFRELGTCHN, LAMBDASER, KAPLAMBRATIO (kappa/lambda light chains)  No results found for: HGBA, HGBA2QUANT, HGBFQUANT, HGBSQUAN (Hemoglobinopathy evaluation)   No results found for: LDH  Lab Results  Component Value Date   IRON 88 10/29/2018   IRON 88 10/29/2018   TIBC 241 (L) 10/29/2018   TIBC 241 (L) 10/29/2018   IRONPCTSAT 37 10/29/2018   IRONPCTSAT 37 10/29/2018   (Iron and TIBC)  Lab Results  Component Value Date   FERRITIN 43 10/29/2018   FERRITIN 43 10/29/2018    Urinalysis    Component Value Date/Time   APPEARANCEUR Clear 08/08/2020 1359   GLUCOSEU Negative 08/08/2020 1359   BILIRUBINUR Negative 08/08/2020 1359   PROTEINUR Negative 08/08/2020 1359   NITRITE Negative 08/08/2020 1359   LEUKOCYTESUR Negative 08/08/2020 1359    STUDIES: MR LIVER W WO CONTRAST  Result Date: 11/07/2020 CLINICAL DATA:  Breast cancer metastatic to the liver, currently undergoing chemotherapy, restaging assessment. EXAM: MRI ABDOMEN WITHOUT AND WITH CONTRAST TECHNIQUE: Multiplanar multisequence MR imaging of the abdomen was performed both before and after the administration of intravenous contrast. CONTRAST:  71m MULTIHANCE GADOBENATE DIMEGLUMINE 529  MG/ML IV SOLN COMPARISON:  08/24/2020 FINDINGS: Lower chest: Unremarkable Hepatobiliary: Small dependent gallstone in the gallbladder on image 25 of series 5, about 0.3 cm in diameter. No biliary dilatation. About 14 mostly small rim enhancing liver lesions are present. One of the larger  lesions is in the left hepatic lobe on image 18 of series 10 and measures 0.8 by 0.8 cm, previously 1.1 by 0.9 cm by my measurements. Another lesion is present in the caudate lobe and measures 1.7 by 1.5 cm on image 25 of series 10, previously 2.6 by 2.1 cm by my measurements. Most of the remaining lesions are substantially smaller than these 2 index lesions, and roughly similar to the prior exam. There is some transient hepatic attenuation difference on early arterial phase images in segment 6. Pancreas:  Unremarkable Spleen:  Unremarkable Adrenals/Urinary Tract:  Unremarkable Stomach/Bowel: Prominent stool throughout the colon favors constipation. Vascular/Lymphatic:  Unremarkable Other:  No supplemental non-categorized findings. Musculoskeletal: Unremarkable IMPRESSION: 1. About 14 mostly small rim enhancing liver lesions are present. The two dominant index lesions are moderately smaller than on 08/24/2020, with the remaining small lesions roughly stable. 2. Prominent stool throughout the colon favors constipation. 3. Small dependent gallstone in the gallbladder. Electronically Signed   By: Van Clines M.D.   On: 11/07/2020 08:33     ELIGIBLE FOR AVAILABLE RESEARCH PROTOCOL: AET  ASSESSMENT: 55 y.o. Erica Wood woman status post right breast lower outer quadrant biopsy 08/04/2020 for a clinical T2 N0 M1, stage IV invasive ductal carcinoma, grade 3, triple positive, with an MIB-1 of 40%  (a) breast MRI 08/14/2020 shows 3 additional right breast lesions and heterogeneous enhancement of the sternum  (b) biopsy of the additional right breast mass 09/05/2020 showed atypical lobular hyperplasia (concordant).  (c) CT scan of the abdomen and pelvis 07/14/2020 shows 2 indeterminate liver lesions  (d) abdominal MRI 08/24/2020 shows multiple liver lesions highly suspicious for liver metastases.  (e) liver biopsy 09/20/2020 positive for carcinoma, prognostic panel estrogen and progesterone receptor positive,  but HER-2 negative; MIB-130%  (f) bone scan 09/14/2020 shows no evidence of bone metastases  (g) CT scan of the chest and head 09/14/2020 showed no evidence of metastatic disease in the chest   (1) neoadjuvant chemotherapy to consist of docetaxel, carboplatin, trastuzumab and pertuzumab every 21 days x 6 started 08/31/2020  (2) trastuzumab and pertuzumab to continue indefinitely  (a) echo 04/12/2020 shows an ejection fraction in the 60-65% range  (b) echo 09/25/2020 shows an ejection fraction in the 60 to 65% range.  (3) definitive surgery to follow  (4) adjuvant radiation as appropriate  (5) antiestrogens to start at the completion of local treatment   PLAN: Shandria is doing quite well today.  She can no longer palpate her breast mass, which is favorable.  She will rpoceed with her fifth of six neoadjuvant chemotherapy treatments with Docetaxel, carboplatin, and Trastuzumab.  Her most recent echocardiogram was normal, and she has no signs of peripheral neuropathy.  Her rash has resolved.    Rozena is already set up for fluids, her next treatment, and her post treatment MRI.    We will see her back in 3 weeks for labs, f/u, and her final treatment She knows to call for any other issue that may develop before the next visit  Total encounter time 20 minutes.Wilber Bihari, NP 11/24/20 12:13 PM Medical Oncology and Hematology [facility] [place], [place] [address] Tel. [phone]    Fax.  [phone]    *Total Encounter Time as defined by the Centers for Medicare and Medicaid Services includes, in addition to the face-to-face time of a patient visit (documented in the note above) non-face-to-face time: obtaining and reviewing outside history, ordering and reviewing medications, tests or procedures, care coordination (communications with other health care professionals or caregivers) and documentation in the medical record.

## 2020-11-24 NOTE — Patient Instructions (Addendum)
[facility] Discharge Instructions for Patients Receiving Chemotherapy  Today you received the following chemotherapy agents Trastuzumab, Taxotere and Carboplatin  To help prevent nausea and vomiting after your treatment, we encourage you to take your nausea medication as directed.   If you develop nausea and vomiting that is not controlled by your nausea medication, call the clinic.   BELOW ARE SYMPTOMS THAT SHOULD BE REPORTED IMMEDIATELY:  *FEVER GREATER THAN 100.5 F  *CHILLS WITH OR WITHOUT FEVER  NAUSEA AND VOMITING THAT IS NOT CONTROLLED WITH YOUR NAUSEA MEDICATION  *UNUSUAL SHORTNESS OF BREATH  *UNUSUAL BRUISING OR BLEEDING  TENDERNESS IN MOUTH AND THROAT WITH OR WITHOUT PRESENCE OF ULCERS  *URINARY PROBLEMS  *BOWEL PROBLEMS  UNUSUAL RASH Items with * indicate a potential emergency and should be followed up as soon as possible.  Feel free to call the clinic should you have any questions or concerns. The clinic phone number is [phone].  Please show the CHEMO ALERT CARD at check-in to the Emergency Department and triage nurse.   

## 2020-11-24 NOTE — Patient Instructions (Signed)

## 2020-11-27 ENCOUNTER — Ambulatory Visit: Payer: 59

## 2020-11-27 ENCOUNTER — Telehealth: Payer: Self-pay | Admitting: Oncology

## 2020-11-27 NOTE — Telephone Encounter (Signed)
Scheduled appts per 1/14 los. Pt confirmed appt date and time. Sent msg to charge pool about add on for 1/18, informed pt that I would be calling back after approval.

## 2020-11-28 ENCOUNTER — Inpatient Hospital Stay: Payer: 59

## 2020-11-28 ENCOUNTER — Other Ambulatory Visit: Payer: Self-pay

## 2020-11-28 VITALS — BP 109/62 | HR 81 | Temp 98.4°F | Resp 18 | Ht 64.0 in

## 2020-11-28 DIAGNOSIS — Z95828 Presence of other vascular implants and grafts: Secondary | ICD-10-CM

## 2020-11-28 DIAGNOSIS — Z5112 Encounter for antineoplastic immunotherapy: Secondary | ICD-10-CM | POA: Diagnosis not present

## 2020-11-28 MED ORDER — HEPARIN SOD (PORK) LOCK FLUSH 100 UNIT/ML IV SOLN
500.0000 [IU] | Freq: Once | INTRAVENOUS | Status: AC
  Administered 2020-11-28: 500 [IU]
  Filled 2020-11-28: qty 5

## 2020-11-28 MED ORDER — SODIUM CHLORIDE 0.9% FLUSH
10.0000 mL | Freq: Once | INTRAVENOUS | Status: AC
  Administered 2020-11-28: 10 mL
  Filled 2020-11-28: qty 10

## 2020-11-28 MED ORDER — SODIUM CHLORIDE 0.9 % IV SOLN
INTRAVENOUS | Status: DC
  Filled 2020-11-28 (×2): qty 250

## 2020-11-28 NOTE — Patient Instructions (Signed)
Rehydration, Adult Rehydration is the replacement of body fluids, salts, and minerals (electrolytes) that are lost during dehydration. Dehydration is when there is not enough water or other fluids in the body. This happens when you lose more fluids than you take in. Common causes of dehydration include:  Not drinking enough fluids. This can occur when you are ill or doing activities that require a lot of energy, especially in hot weather.  Conditions that cause loss of water or other fluids, such as diarrhea, vomiting, sweating, or urinating a lot.  Other illnesses, such as fever or infection.  Certain medicines, such as those that remove excess fluid from the body (diuretics). Symptoms of mild or moderate dehydration may include thirst, dry lips and mouth, and dizziness. Symptoms of severe dehydration may include increased heart rate, confusion, fainting, and not urinating. For severe dehydration, you may need to get fluids through an IV at the hospital. For mild or moderate dehydration, you can usually rehydrate at home by drinking certain fluids as told by your health care provider. What are the risks? Generally, rehydration is safe. However, taking in too much fluid (overhydration) can be a problem. This is rare. Overhydration can cause an electrolyte imbalance, kidney failure, or a decrease in salt (sodium) levels in the body. Supplies needed You will need an oral rehydration solution (ORS) if your health care provider tells you to use one. This is a drink to treat dehydration. It can be found in pharmacies and retail stores. How to rehydrate Fluids Follow instructions from your health care provider for rehydration. The kind of fluid and the amount you should drink depend on your condition. In general, you should choose drinks that you prefer.  If told by your health care provider, drink an ORS. ? Make an ORS by following instructions on the package. ? Start by drinking small amounts,  about  cup (120 mL) every 5-10 minutes. ? Slowly increase how much you drink until you have taken the amount recommended by your health care provider.  Drink enough clear fluids to keep your urine pale yellow. If you were told to drink an ORS, finish it first, then start slowly drinking other clear fluids. Drink fluids such as: ? Water. This includes sparkling water and flavored water. Drinking only water can lead to having too little sodium in your body (hyponatremia). Follow the advice of your health care provider. ? Water from ice chips you suck on. ? Fruit juice with water you add to it (diluted). ? Sports drinks. ? Hot or cold herbal teas. ? Broth-based soups. ? Milk or milk products. Food Follow instructions from your health care provider about what to eat while you rehydrate. Your health care provider may recommend that you slowly begin eating regular foods in small amounts.  Eat foods that contain a healthy balance of electrolytes, such as bananas, oranges, potatoes, tomatoes, and spinach.  Avoid foods that are greasy or contain a lot of sugar. In some cases, you may get nutrition through a feeding tube that is passed through your nose and into your stomach (nasogastric tube, or NG tube). This may be done if you have uncontrolled vomiting or diarrhea.   Beverages to avoid Certain beverages may make dehydration worse. While you rehydrate, avoid drinking alcohol.   How to tell if you are recovering from dehydration You may be recovering from dehydration if:  You are urinating more often than before you started rehydrating.  Your urine is pale yellow.  Your energy level   improves.  You vomit less frequently.  You have diarrhea less frequently.  Your appetite improves or returns to normal.  You feel less dizzy or less light-headed.  Your skin tone and color start to look more normal. Follow these instructions at home:  Take over-the-counter and prescription medicines only  as told by your health care provider.  Do not take sodium tablets. Doing this can lead to having too much sodium in your body (hypernatremia). Contact a health care provider if:  You continue to have symptoms of mild or moderate dehydration, such as: ? Thirst. ? Dry lips. ? Slightly dry mouth. ? Dizziness. ? Dark urine or less urine than normal. ? Muscle cramps.  You continue to vomit or have diarrhea. Get help right away if you:  Have symptoms of dehydration that get worse.  Have a fever.  Have a severe headache.  Have been vomiting and the following happens: ? Your vomiting gets worse or does not go away. ? Your vomit includes blood or green matter (bile). ? You cannot eat or drink without vomiting.  Have problems with urination or bowel movements, such as: ? Diarrhea that gets worse or does not go away. ? Blood in your stool (feces). This may cause stool to look black and tarry. ? Not urinating, or urinating only a small amount of very dark urine, within 6-8 hours.  Have trouble breathing.  Have symptoms that get worse with treatment. These symptoms may represent a serious problem that is an emergency. Do not wait to see if the symptoms will go away. Get medical help right away. Call your local emergency services (911 in the [place] Do not drive yourself to the hospital. Summary  Rehydration is the replacement of body fluids and minerals (electrolytes) that are lost during dehydration.  Follow instructions from your health care provider for rehydration. The kind of fluid and amount you should drink depend on your condition.  Slowly increase how much you drink until you have taken the amount recommended by your health care provider.  Contact your health care provider if you continue to show signs of mild or moderate dehydration. This information is not intended to replace advice given to you by your health care provider. Make sure you discuss any questions you have with  your health care provider. Document Revised: 12/29/2019 Document Reviewed: 11/08/2019 Elsevier Patient Education  2021 [employer]vier Inc.  

## 2020-11-29 ENCOUNTER — Inpatient Hospital Stay: Payer: 59

## 2020-11-29 ENCOUNTER — Ambulatory Visit: Payer: 59

## 2020-11-29 ENCOUNTER — Other Ambulatory Visit: Payer: Self-pay

## 2020-11-29 VITALS — BP 107/67 | HR 89 | Temp 98.9°F | Resp 16 | Ht 64.0 in

## 2020-11-29 DIAGNOSIS — Z5112 Encounter for antineoplastic immunotherapy: Secondary | ICD-10-CM | POA: Diagnosis not present

## 2020-11-29 DIAGNOSIS — Z95828 Presence of other vascular implants and grafts: Secondary | ICD-10-CM

## 2020-11-29 MED ORDER — SODIUM CHLORIDE 0.9 % IV SOLN
INTRAVENOUS | Status: DC
  Filled 2020-11-29 (×2): qty 250

## 2020-11-29 MED ORDER — SODIUM CHLORIDE 0.9% FLUSH
10.0000 mL | Freq: Once | INTRAVENOUS | Status: AC
  Administered 2020-11-29: 10 mL
  Filled 2020-11-29: qty 10

## 2020-11-29 MED ORDER — HEPARIN SOD (PORK) LOCK FLUSH 100 UNIT/ML IV SOLN
500.0000 [IU] | Freq: Once | INTRAVENOUS | Status: AC
  Administered 2020-11-29: 500 [IU]
  Filled 2020-11-29: qty 5

## 2020-11-29 NOTE — Patient Instructions (Signed)
Rehydration, Adult Rehydration is the replacement of body fluids, salts, and minerals (electrolytes) that are lost during dehydration. Dehydration is when there is not enough water or other fluids in the body. This happens when you lose more fluids than you take in. Common causes of dehydration include:  Not drinking enough fluids. This can occur when you are ill or doing activities that require a lot of energy, especially in hot weather.  Conditions that cause loss of water or other fluids, such as diarrhea, vomiting, sweating, or urinating a lot.  Other illnesses, such as fever or infection.  Certain medicines, such as those that remove excess fluid from the body (diuretics). Symptoms of mild or moderate dehydration may include thirst, dry lips and mouth, and dizziness. Symptoms of severe dehydration may include increased heart rate, confusion, fainting, and not urinating. For severe dehydration, you may need to get fluids through an IV at the hospital. For mild or moderate dehydration, you can usually rehydrate at home by drinking certain fluids as told by your health care provider. What are the risks? Generally, rehydration is safe. However, taking in too much fluid (overhydration) can be a problem. This is rare. Overhydration can cause an electrolyte imbalance, kidney failure, or a decrease in salt (sodium) levels in the body. Supplies needed You will need an oral rehydration solution (ORS) if your health care provider tells you to use one. This is a drink to treat dehydration. It can be found in pharmacies and retail stores. How to rehydrate Fluids Follow instructions from your health care provider for rehydration. The kind of fluid and the amount you should drink depend on your condition. In general, you should choose drinks that you prefer.  If told by your health care provider, drink an ORS. ? Make an ORS by following instructions on the package. ? Start by drinking small amounts,  about  cup (120 mL) every 5-10 minutes. ? Slowly increase how much you drink until you have taken the amount recommended by your health care provider.  Drink enough clear fluids to keep your urine pale yellow. If you were told to drink an ORS, finish it first, then start slowly drinking other clear fluids. Drink fluids such as: ? Water. This includes sparkling water and flavored water. Drinking only water can lead to having too little sodium in your body (hyponatremia). Follow the advice of your health care provider. ? Water from ice chips you suck on. ? Fruit juice with water you add to it (diluted). ? Sports drinks. ? Hot or cold herbal teas. ? Broth-based soups. ? Milk or milk products. Food Follow instructions from your health care provider about what to eat while you rehydrate. Your health care provider may recommend that you slowly begin eating regular foods in small amounts.  Eat foods that contain a healthy balance of electrolytes, such as bananas, oranges, potatoes, tomatoes, and spinach.  Avoid foods that are greasy or contain a lot of sugar. In some cases, you may get nutrition through a feeding tube that is passed through your nose and into your stomach (nasogastric tube, or NG tube). This may be done if you have uncontrolled vomiting or diarrhea.   Beverages to avoid Certain beverages may make dehydration worse. While you rehydrate, avoid drinking alcohol.   How to tell if you are recovering from dehydration You may be recovering from dehydration if:  You are urinating more often than before you started rehydrating.  Your urine is pale yellow.  Your energy level   improves.  You vomit less frequently.  You have diarrhea less frequently.  Your appetite improves or returns to normal.  You feel less dizzy or less light-headed.  Your skin tone and color start to look more normal. Follow these instructions at home:  Take over-the-counter and prescription medicines only  as told by your health care provider.  Do not take sodium tablets. Doing this can lead to having too much sodium in your body (hypernatremia). Contact a health care provider if:  You continue to have symptoms of mild or moderate dehydration, such as: ? Thirst. ? Dry lips. ? Slightly dry mouth. ? Dizziness. ? Dark urine or less urine than normal. ? Muscle cramps.  You continue to vomit or have diarrhea. Get help right away if you:  Have symptoms of dehydration that get worse.  Have a fever.  Have a severe headache.  Have been vomiting and the following happens: ? Your vomiting gets worse or does not go away. ? Your vomit includes blood or green matter (bile). ? You cannot eat or drink without vomiting.  Have problems with urination or bowel movements, such as: ? Diarrhea that gets worse or does not go away. ? Blood in your stool (feces). This may cause stool to look black and tarry. ? Not urinating, or urinating only a small amount of very dark urine, within 6-8 hours.  Have trouble breathing.  Have symptoms that get worse with treatment. These symptoms may represent a serious problem that is an emergency. Do not wait to see if the symptoms will go away. Get medical help right away. Call your local emergency services (911 in the [place] Do not drive yourself to the hospital. Summary  Rehydration is the replacement of body fluids and minerals (electrolytes) that are lost during dehydration.  Follow instructions from your health care provider for rehydration. The kind of fluid and amount you should drink depend on your condition.  Slowly increase how much you drink until you have taken the amount recommended by your health care provider.  Contact your health care provider if you continue to show signs of mild or moderate dehydration. This information is not intended to replace advice given to you by your health care provider. Make sure you discuss any questions you have with  your health care provider. Document Revised: 12/29/2019 Document Reviewed: 11/08/2019 Elsevier Patient Education  2021 [employer]vier Inc.  

## 2020-11-30 ENCOUNTER — Encounter: Payer: Self-pay | Admitting: Physical Therapy

## 2020-11-30 ENCOUNTER — Ambulatory Visit: Payer: 59 | Attending: Adult Health | Admitting: Physical Therapy

## 2020-11-30 DIAGNOSIS — Z170 Estrogen receptor positive status [ER+]: Secondary | ICD-10-CM | POA: Insufficient documentation

## 2020-11-30 DIAGNOSIS — M6281 Muscle weakness (generalized): Secondary | ICD-10-CM | POA: Diagnosis present

## 2020-11-30 DIAGNOSIS — C50511 Malignant neoplasm of lower-outer quadrant of right female breast: Secondary | ICD-10-CM | POA: Diagnosis present

## 2020-11-30 DIAGNOSIS — R293 Abnormal posture: Secondary | ICD-10-CM | POA: Insufficient documentation
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
## 2020-12-01 ENCOUNTER — Ambulatory Visit: Payer: 59

## 2020-12-02 ENCOUNTER — Inpatient Hospital Stay: Payer: 59

## 2020-12-02 NOTE — Progress Notes (Incomplete)
Erica Wood  Telephone:([phone] Fax:(336) [phone]     ID: Erica Wood DOB: Feb 21, 1966  MR#: [id number]  PNP#:[id number]  Patient Care Team: Eunice Blase, MD as PCP - General (Family Medicine) Prudence Davidson, [place] Bing, MD as Referring Physician (Oncology) Rockwell Germany, RN as Oncology Nurse Navigator Mauro Kaufmann, RN as Oncology Nurse Navigator Rolm Bookbinder, MD as Consulting Physician (General Surgery) Wood, Virgie Dad, MD as Consulting Physician (Oncology) Kyung Rudd, MD as Consulting Physician (Radiation Oncology) Bo Merino, MD as Consulting Physician (Rheumatology) Olga Millers, MD as Consulting Physician (Obstetrics and Gynecology) Larey Dresser, MD as Consulting Physician (Cardiology) Chauncey Cruel, MD OTHER MD:  I connected with Erica Wood on 12/04/20 at 11:45 AM EST by {Blank single:19197::"video enabled telemedicine visit","telephone visit"} and verified that I am speaking with the correct person using two identifiers.   I discussed the limitations, risks, security and privacy concerns of performing an evaluation and management service by telemedicine and the availability of in-person appointments. I also discussed with the patient that there may be a patient responsible charge related to this service. The patient expressed understanding and agreed to proceed.   Other persons participating in the visit and their role in the encounter: ***   Patient's location: ***  Provider's location: [place]: triple positive breast cancer  CURRENT TREATMENT: Neoadjuvant therapy    INTERVAL HISTORY: Erica Wood was contacted today for follow up and treatment of her triple positive breast cancer.   She began neoadjuvant chemotherapy, consisting of docetaxel, carboplatin, trastuzumab, and pertuzumab every 21 days x6, on 08/31/2020.  Her epratuzumab was held beginning cycle 4.  She is ready for  her final cycle on 12/15/2020.  REVIEW OF SYSTEMS: Erica Wood    COVID 78 VACCINATION STATUS: Status post Sport and exercise psychologist.   HISTORY OF CURRENT ILLNESS: From the original intake note:  Erica Wood herself palpated an abnormality in the right breast several years ago.  This had not changed until sometime in June 2021.  She was evaluated by Dr. Ouida Sills and underwent right diagnostic mammography with tomography and right breast ultrasonography at [facility] on 07/26/2020 showing: breast density category D; 2.6 cm mass in right breast at 6 o'clock, at site of palpable concern; two focal groups of punctate calcifications posterior to palpable mass; no right axillary adenopathy.  Accordingly on 08/04/2020 she proceeded to biopsy of the right breast area in question. The pathology from this procedure ([id number]) showed: invasive ductal carcinoma, grade 3. Prognostic indicators significant for: estrogen receptor, 95% positive with moderate staining intensity and progesterone receptor, 10% positive with strong staining intensity. Proliferation marker Ki67 at 40%. HER2 equivocal by immunohistochemistry (2+), but positive by fluorescent in situ hybridization with a signals ratio 3.23 and number per cell 6.45.  Biopsy of the posterior calcifications was benign.  She also underwent breast MRI on 08/11/2020 showing: breast composition D; 2.6 cm biopsy-proven malignancy in right breast at 6 o'clock, with probable skin involvement noted on previous ultrasound; additional enhancing masses within right breast-- 5 mm in lower-outer, 1.2 cm in upper-inner, 7 mm in upper-outer; no evidence of left breast malignancy or lymphadenopathy; heterogeneous enhancement of sternum, of uncertain significance.  The patient's subsequent history is as detailed below.   PAST MEDICAL HISTORY: Past Medical History:  Diagnosis Date  . Anti-cardiolipin antibody positive   . Anti-cardiolipin antibody syndrome (HCC)   . Anxiety   .  Arthralgia   . Arthritis  bil knees, hands  . Atypical chest pain   . Autoimmune disease ([place])   . Cancer ([place]) 07/2020   right breast IDC  . Chronic fatigue   . Depression   . DVT (deep venous thrombosis) ([place])   . IBS (irritable bowel syndrome)    with constipation  . Lupus ([place])   . Pulmonary embolus ([place])   . Raynaud's syndrome   . Sicca ([place])   . Thrombocytopenia ([place])     PAST SURGICAL HISTORY: Past Surgical History:  Procedure Laterality Date  . ABLATION  12/2018   leg veins  . ABLATION Right 08/2019   venous leg   . APPENDECTOMY    . COLON SURGERY     Perforated colon repair after colostomy  . GANGLION CYST EXCISION    . HYSTEROSCOPY WITH D & C N/A 01/13/2014   Procedure: DILATATION AND CURETTAGE /HYSTEROSCOPY with resection ;  Surgeon: Olga Millers, MD;  Location: [facility];  Service: Gynecology;  Laterality: N/A;  . LAPAROTOMY    . PORTACATH PLACEMENT N/A 09/13/2020   Procedure: INSERTION PORT-A-CATH WITH ULTRASOUND GUIDANCE;  Surgeon: Rolm Bookbinder, MD;  Location: [facility];  Service: General;  Laterality: N/A;    FAMILY HISTORY: Family History  Problem Relation Age of Onset  . Rheum arthritis Father   . Hemachromatosis Father   . Cancer Sister        bone   as of October 2021 her father isage 62 and her mother age 58.. The patient had one sister (and no brothers). The sister was diagnosed with Ewing's sarcoma at age 43, and died from the disease after many difficult treatments.    GYNECOLOGIC HISTORY:  No LMP recorded. (Menstrual status: Irregular Periods).  Menarche: 14 or 55 years old GX P 0 LMP around 2019 Contraceptive: yes HRT no  Hysterectomy? no BSO? 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 She is widowed. She lives at home with her cat, Erica Wood. She is not a Ambulance person.    ADVANCED DIRECTIVES: not in place; at the 08/16/2020 visit the patient was given the  appropriate documents to complete and notarized at her discretion.   HEALTH MAINTENANCE: Social History   Tobacco Use  . Smoking status: Former Smoker    Packs/day: 0.50    Years: 5.00    Pack years: 2.50    Types: Cigarettes    Start date: 08/06/2006    Quit date: 08/07/2011    Years since quitting: 9.3  . Smokeless tobacco: Never Used  Vaping Use  . Vaping Use: Never used  Substance Use Topics  . Alcohol use: Yes    Comment: occasionally  . Drug use: No     Colonoscopy: 2002 for perforated bowel  PAP: 03/2020  Bone density: done at Dr. [facility] office   Allergies  Allergen Reactions  . Beef-Derived Products Hives  . Latex Rash    Contact  . Sulfa Antibiotics Rash    Current Outpatient Medications  Medication Sig Dispense Refill  . acetaminophen (TYLENOL) 500 MG tablet Take 1,000 mg by mouth every 6 (six) hours as needed for moderate pain or headache.    . amphetamine-dextroamphetamine (ADDERALL XR) 20 MG 24 hr capsule Take 1 capsule (20 mg total) by mouth daily. 30 capsule 0  . Ascorbic Acid (VITAMIN C PO) Take 3,000 mg by mouth daily.     Marland Kitchen CAMILA 0.35 MG tablet Take 1 tablet by mouth daily.  11  . Cholecalciferol (VITAMIN D3) 75 MCG (3000 UT) TABS Take 3,000 Units by mouth daily.    Marland Kitchen dexamethasone (DECADRON) 4 MG tablet TAKE 2 TABLETS(8 MG) BY MOUTH TWICE DAILY. START THE DAY BEFORE TAXOTERE. THEN TAKE DAILY FOR 3 DAYS AFTER CHEMOTHERAPY 30 tablet 1  . Docusate Sodium (COLACE PO) Take 2 tablets by mouth 2 (two) times daily.    . fluconazole (DIFLUCAN) 100 MG tablet TAKE AS DIRECTED 30 tablet 0  . Fluocinonide 0.1 % CREA Apply 1 application topically 2 (two) times daily. 240 g 2  . hydroxychloroquine (PLAQUENIL) 200 MG tablet Take one tablet by mouth twice daily Monday through Friday. 120 tablet 0  . lidocaine-prilocaine (EMLA) cream Apply to affected area once (Patient taking differently: Apply 1 application topically daily as needed (port access).) 30 g 3  .  loratadine (CLARITIN) 10 MG tablet Take 10 mg by mouth See admin instructions. Take 10 mg daily for 5 days after chemo treatments    . LORazepam (ATIVAN) 0.5 MG tablet TAKE 1 TABLET(0.5 MG) BY MOUTH AT BEDTIME AS NEEDED FOR NAUSEA OR VOMITING 30 tablet 0  . magic mouthwash SOLN Take 5 mLs by mouth 4 (four) times daily as needed for mouth pain. 240 mL 0  . metoCLOPramide (REGLAN) 10 MG tablet Take 1 tablet (10 mg total) by mouth every 6 (six) hours as needed for nausea. 40 tablet 2  . metroNIDAZOLE (METROGEL) 1 % gel Apply topically daily. 45 g 0  . oxyCODONE (OXY IR/ROXICODONE) 5 MG immediate release tablet Take 1 tablet (5 mg total) by mouth every 6 (six) hours as needed. 6 tablet 0  . Pegfilgrastim (NEULASTA [place]) Inject 1 Dose into the skin See admin instructions. Inject 24 to 72 hours after chemo treatment    . polyethylene glycol (MIRALAX / GLYCOLAX) 17 g packet Take 17 g by mouth daily.    . prochlorperazine (COMPAZINE) 10 MG tablet Take 0.5-1 tablets (5-10 mg total) by mouth 4 (four) times daily -  before meals and at bedtime. Start the evening of chemotherapy and take for 2 days, then take as needed 30 tablet 1  . spironolactone (ALDACTONE) 25 MG tablet Take 25 mg by mouth daily as needed (swelling).    . traMADol (ULTRAM) 50 MG tablet Take 1-2 tablets (50-100 mg total) by mouth every 6 (six) hours as needed. (Patient taking differently: Take 50-100 mg by mouth every 6 (six) hours as needed for moderate pain.) 60 tablet 0  . tretinoin (RETIN-A) 0.1 % cream Apply 1 application topically at bedtime as needed (acne).     . triamcinolone lotion (KENALOG) 0.1 % Apply 1 application topically 3 (three) times daily. 120 mL 2  . triamterene-hydrochlorothiazide (DYAZIDE) 37.5-25 MG capsule TAKE 1 CAPSULE BY MOUTH DAILY AS NEEDED FOR SWELLING 90 capsule 0  . UNABLE TO FIND Green Vibrance - 1 scoop daily    . warfarin (COUMADIN) 5 MG tablet Take 1 tablet (5 mg total) by mouth daily. 30 tablet 11   No  current facility-administered medications for this visit.   Facility-Administered Medications Ordered in Other Visits  Medication Dose Route Frequency Provider Last Rate Last Admin  . 0.9 %  sodium chloride infusion   Intravenous Continuous Wood, Virgie Dad, MD 500 mL/hr at 12/04/20 1032 New Bag at 12/04/20 1032    OBJECTIVE: White woman who appears younger than stated age  There were no vitals filed for this visit.   There is no height or weight on file  to calculate BMI.   Wt Readings from Last 3 Encounters:  11/24/20 113 lb 1.6 oz (51.3 kg)  11/16/20 111 lb 8 oz (50.6 kg)  11/06/20 110 lb 1.6 oz (49.9 kg)      ECOG FS:2 - Symptomatic, <50% confined to bed  Telemedicine visit 12/04/2020  {GENERAL: Patient is a well appearing female in no acute distress HEENT:  Sclerae anicteric. Mask in place. Neck is supple.  NODES:  No cervical, supraclavicular, or axillary lymphadenopathy palpated.  BREAST EXAM:  Deferred. LUNGS:  Clear to auscultation bilaterally.  No wheezes or rhonchi. HEART:  Regular rate and rhythm. No murmur appreciated. ABDOMEN:  Soft, nontender.  Positive, normoactive bowel sounds. No organomegaly palpated. MSK:  No focal spinal tenderness to palpation.  EXTREMITIES:  No peripheral edema.   SKIN:  Clear with no obvious rashes or skin changes. No nail dyscrasia. NEURO:  Nonfocal. Well oriented.  Appropriate affect}   LAB RESULTS:  CMP     Component Value Date/Time   NA 136 11/24/2020 1125   NA 137 07/28/2020 1108   K 3.6 11/24/2020 1125   CL 100 11/24/2020 1125   CO2 29 11/24/2020 1125   GLUCOSE 122 (H) 11/24/2020 1125   BUN 17 11/24/2020 1125   BUN 8 07/28/2020 1108   CREATININE 0.79 11/24/2020 1125   CREATININE 0.78 10/20/2020 1330   CALCIUM 9.0 11/24/2020 1125   PROT 6.0 (L) 11/24/2020 1125   PROT 7.3 07/28/2020 1108   ALBUMIN 3.6 11/24/2020 1125   ALBUMIN 4.9 07/28/2020 1108   AST 16 11/24/2020 1125   AST 19 09/21/2020 0827   ALT 13 11/24/2020  1125   ALT 27 09/21/2020 0827   ALKPHOS 66 11/24/2020 1125   BILITOT 0.2 (L) 11/24/2020 1125   BILITOT 0.4 09/21/2020 0827   GFRNONAA >60 11/24/2020 1125   GFRNONAA >60 10/20/2020 1330   GFRAA 95 07/28/2020 1108    No results found for: TOTALPROTELP, ALBUMINELP, A1GS, A2GS, BETS, BETA2SER, GAMS, MSPIKE, SPEI  Lab Results  Component Value Date   WBC 8.6 11/24/2020   NEUTROABS 5.7 11/24/2020   HGB 9.7 (L) 11/24/2020   HCT 30.1 (L) 11/24/2020   MCV 101.7 (H) 11/24/2020   PLT 278 11/24/2020    No results found for: LABCA2  No components found for: HMCNOB096  No results for input(s): INR in the last 168 hours.  No results found for: LABCA2  No results found for: GEZ662  No results found for: HUT654  No results found for: YTK354  No results found for: CA2729  No components found for: HGQUANT  No results found for: CEA1 / No results found for: CEA1   No results found for: AFPTUMOR  No results found for: CHROMOGRNA  No results found for: KPAFRELGTCHN, LAMBDASER, KAPLAMBRATIO (kappa/lambda light chains)  No results found for: HGBA, HGBA2QUANT, HGBFQUANT, HGBSQUAN (Hemoglobinopathy evaluation)   No results found for: LDH  Lab Results  Component Value Date   IRON 88 10/29/2018   IRON 88 10/29/2018   TIBC 241 (L) 10/29/2018   TIBC 241 (L) 10/29/2018   IRONPCTSAT 37 10/29/2018   IRONPCTSAT 37 10/29/2018   (Iron and TIBC)  Lab Results  Component Value Date   FERRITIN 43 10/29/2018   FERRITIN 43 10/29/2018    Urinalysis    Component Value Date/Time   APPEARANCEUR Clear 08/08/2020 1359   GLUCOSEU Negative 08/08/2020 1359   BILIRUBINUR Negative 08/08/2020 1359   PROTEINUR Negative 08/08/2020 1359   NITRITE Negative 08/08/2020 1359   LEUKOCYTESUR  Negative 08/08/2020 1359    STUDIES: MR LIVER W WO CONTRAST  Result Date: 11/07/2020 CLINICAL DATA:  Breast cancer metastatic to the liver, currently undergoing chemotherapy, restaging assessment. EXAM: MRI  ABDOMEN WITHOUT AND WITH CONTRAST TECHNIQUE: Multiplanar multisequence MR imaging of the abdomen was performed both before and after the administration of intravenous contrast. CONTRAST:  81m MULTIHANCE GADOBENATE DIMEGLUMINE 529 MG/ML IV SOLN COMPARISON:  08/24/2020 FINDINGS: Lower chest: Unremarkable Hepatobiliary: Small dependent gallstone in the gallbladder on image 25 of series 5, about 0.3 cm in diameter. No biliary dilatation. About 14 mostly small rim enhancing liver lesions are present. One of the larger lesions is in the left hepatic lobe on image 18 of series 10 and measures 0.8 by 0.8 cm, previously 1.1 by 0.9 cm by my measurements. Another lesion is present in the caudate lobe and measures 1.7 by 1.5 cm on image 25 of series 10, previously 2.6 by 2.1 cm by my measurements. Most of the remaining lesions are substantially smaller than these 2 index lesions, and roughly similar to the prior exam. There is some transient hepatic attenuation difference on early arterial phase images in segment 6. Pancreas:  Unremarkable Spleen:  Unremarkable Adrenals/Urinary Tract:  Unremarkable Stomach/Bowel: Prominent stool throughout the colon favors constipation. Vascular/Lymphatic:  Unremarkable Other:  No supplemental non-categorized findings. Musculoskeletal: Unremarkable IMPRESSION: 1. About 14 mostly small rim enhancing liver lesions are present. The two dominant index lesions are moderately smaller than on 08/24/2020, with the remaining small lesions roughly stable. 2. Prominent stool throughout the colon favors constipation. 3. Small dependent gallstone in the gallbladder. Electronically Signed   By: WVan ClinesM.D.   On: 11/07/2020 08:33     ELIGIBLE FOR AVAILABLE RESEARCH PROTOCOL: AET  ASSESSMENT: 55y.o. Erica Wood woman status post right breast lower outer quadrant biopsy 08/04/2020 for a clinical T2 N0 M1, stage IV invasive ductal carcinoma, grade 3, triple positive, with an MIB-1 of 40%  (a)  breast MRI 08/14/2020 shows 3 additional right breast lesions and heterogeneous enhancement of the sternum  (b) biopsy of the additional right breast mass 09/05/2020 showed atypical lobular hyperplasia (concordant).  (c) CT scan of the abdomen and pelvis 07/14/2020 shows 2 indeterminate liver lesions  (d) abdominal MRI 08/24/2020 shows multiple liver lesions highly suspicious for liver metastases.  (e) liver biopsy 09/20/2020 positive for carcinoma, prognostic panel estrogen and progesterone receptor positive, but HER-2 negative; MIB-130%  (f) bone scan 09/14/2020 shows no evidence of bone metastases  (g) CT scan of the chest and head 09/14/2020 showed no evidence of metastatic disease in the chest   (1) neoadjuvant chemotherapy to consist of docetaxel, carboplatin, trastuzumab and pertuzumab every 21 days x 6 started 08/31/2020  (a) epratuzumab held beginning cycle 4 because of diarrhea concerns  (2) trastuzumab to continue indefinitely  (a) echo 04/12/2020 shows an ejection fraction in the 60-65% range  (b) echo 09/25/2020 shows an ejection fraction in the 60 to 65% range.  (3) definitive surgery to follow  (4) adjuvant radiation as appropriate  (5) antiestrogens to start at the completion of local treatment   PLAN: Erica Wood doing quite well today.  She can no longer palpate her breast mass, which is favorable.  She will rpoceed with her fifth of six neoadjuvant chemotherapy treatments with Docetaxel, carboplatin, and Trastuzumab.  Her most recent echocardiogram was normal, and she has no signs of peripheral neuropathy.  Her rash has resolved.    Erica Wood already set up for fluids, her next treatment,  and her post treatment MRI.    We will see her back in 3 weeks for labs, f/u, and her final treatment She knows to call for any other issue that may develop before the next visit  Total encounter time 20 minutes.Erica Jews C. Magrinat, MD 12/04/20 11:14 AM Medical Oncology and  Hematology [facility] [place], [place] [address] Tel. [phone]    Fax. [phone]   I, Wilburn Mylar, am acting as scribe for Dr. Virgie Dad. Wood.  {Add scribe attestation statement}   *Total Encounter Time as defined by the Centers for Medicare and Medicaid Services includes, in addition to the face-to-face time of a patient visit (documented in the note above) non-face-to-face time: obtaining and reviewing outside history, ordering and reviewing medications, tests or procedures, care coordination (communications with other health care professionals or caregivers) and documentation in the medical record.

## 2020-12-03 ENCOUNTER — Inpatient Hospital Stay: Payer: 59

## 2020-12-03 ENCOUNTER — Other Ambulatory Visit: Payer: Self-pay

## 2020-12-03 VITALS — BP 103/62 | HR 73 | Temp 97.7°F | Resp 18

## 2020-12-03 DIAGNOSIS — Z5112 Encounter for antineoplastic immunotherapy: Secondary | ICD-10-CM | POA: Diagnosis not present

## 2020-12-03 DIAGNOSIS — Z95828 Presence of other vascular implants and grafts: Secondary | ICD-10-CM

## 2020-12-03 MED ORDER — SODIUM CHLORIDE 0.9% FLUSH
10.0000 mL | Freq: Once | INTRAVENOUS | Status: AC
  Administered 2020-12-03: 10 mL
  Filled 2020-12-03: qty 10

## 2020-12-03 MED ORDER — ONDANSETRON HCL 4 MG/2ML IJ SOLN
8.0000 mg | Freq: Once | INTRAMUSCULAR | Status: AC
  Administered 2020-12-03: 8 mg via INTRAVENOUS

## 2020-12-03 MED ORDER — ONDANSETRON HCL 4 MG/2ML IJ SOLN
INTRAMUSCULAR | Status: AC
  Filled 2020-12-03: qty 4

## 2020-12-03 MED ORDER — SODIUM CHLORIDE 0.9 % IV SOLN
INTRAVENOUS | Status: DC
  Filled 2020-12-03 (×2): qty 250

## 2020-12-03 MED ORDER — HEPARIN SOD (PORK) LOCK FLUSH 100 UNIT/ML IV SOLN
500.0000 [IU] | Freq: Once | INTRAVENOUS | Status: AC
  Administered 2020-12-03: 500 [IU]
  Filled 2020-12-03: qty 5

## 2020-12-03 NOTE — Patient Instructions (Signed)
Rehydration, Adult Rehydration is the replacement of body fluids, salts, and minerals (electrolytes) that are lost during dehydration. Dehydration is when there is not enough water or other fluids in the body. This happens when you lose more fluids than you take in. Common causes of dehydration include:  Not drinking enough fluids. This can occur when you are ill or doing activities that require a lot of energy, especially in hot weather.  Conditions that cause loss of water or other fluids, such as diarrhea, vomiting, sweating, or urinating a lot.  Other illnesses, such as fever or infection.  Certain medicines, such as those that remove excess fluid from the body (diuretics). Symptoms of mild or moderate dehydration may include thirst, dry lips and mouth, and dizziness. Symptoms of severe dehydration may include increased heart rate, confusion, fainting, and not urinating. For severe dehydration, you may need to get fluids through an IV at the hospital. For mild or moderate dehydration, you can usually rehydrate at home by drinking certain fluids as told by your health care provider. What are the risks? Generally, rehydration is safe. However, taking in too much fluid (overhydration) can be a problem. This is rare. Overhydration can cause an electrolyte imbalance, kidney failure, or a decrease in salt (sodium) levels in the body. Supplies needed You will need an oral rehydration solution (ORS) if your health care provider tells you to use one. This is a drink to treat dehydration. It can be found in pharmacies and retail stores. How to rehydrate Fluids Follow instructions from your health care provider for rehydration. The kind of fluid and the amount you should drink depend on your condition. In general, you should choose drinks that you prefer.  If told by your health care provider, drink an ORS. ? Make an ORS by following instructions on the package. ? Start by drinking small amounts,  about  cup (120 mL) every 5-10 minutes. ? Slowly increase how much you drink until you have taken the amount recommended by your health care provider.  Drink enough clear fluids to keep your urine pale yellow. If you were told to drink an ORS, finish it first, then start slowly drinking other clear fluids. Drink fluids such as: ? Water. This includes sparkling water and flavored water. Drinking only water can lead to having too little sodium in your body (hyponatremia). Follow the advice of your health care provider. ? Water from ice chips you suck on. ? Fruit juice with water you add to it (diluted). ? Sports drinks. ? Hot or cold herbal teas. ? Broth-based soups. ? Milk or milk products. Food Follow instructions from your health care provider about what to eat while you rehydrate. Your health care provider may recommend that you slowly begin eating regular foods in small amounts.  Eat foods that contain a healthy balance of electrolytes, such as bananas, oranges, potatoes, tomatoes, and spinach.  Avoid foods that are greasy or contain a lot of sugar. In some cases, you may get nutrition through a feeding tube that is passed through your nose and into your stomach (nasogastric tube, or NG tube). This may be done if you have uncontrolled vomiting or diarrhea.   Beverages to avoid Certain beverages may make dehydration worse. While you rehydrate, avoid drinking alcohol.   How to tell if you are recovering from dehydration You may be recovering from dehydration if:  You are urinating more often than before you started rehydrating.  Your urine is pale yellow.  Your energy level   improves.  You vomit less frequently.  You have diarrhea less frequently.  Your appetite improves or returns to normal.  You feel less dizzy or less light-headed.  Your skin tone and color start to look more normal. Follow these instructions at home:  Take over-the-counter and prescription medicines only  as told by your health care provider.  Do not take sodium tablets. Doing this can lead to having too much sodium in your body (hypernatremia). Contact a health care provider if:  You continue to have symptoms of mild or moderate dehydration, such as: ? Thirst. ? Dry lips. ? Slightly dry mouth. ? Dizziness. ? Dark urine or less urine than normal. ? Muscle cramps.  You continue to vomit or have diarrhea. Get help right away if you:  Have symptoms of dehydration that get worse.  Have a fever.  Have a severe headache.  Have been vomiting and the following happens: ? Your vomiting gets worse or does not go away. ? Your vomit includes blood or green matter (bile). ? You cannot eat or drink without vomiting.  Have problems with urination or bowel movements, such as: ? Diarrhea that gets worse or does not go away. ? Blood in your stool (feces). This may cause stool to look black and tarry. ? Not urinating, or urinating only a small amount of very dark urine, within 6-8 hours.  Have trouble breathing.  Have symptoms that get worse with treatment. These symptoms may represent a serious problem that is an emergency. Do not wait to see if the symptoms will go away. Get medical help right away. Call your local emergency services (911 in the [place] Do not drive yourself to the hospital. Summary  Rehydration is the replacement of body fluids and minerals (electrolytes) that are lost during dehydration.  Follow instructions from your health care provider for rehydration. The kind of fluid and amount you should drink depend on your condition.  Slowly increase how much you drink until you have taken the amount recommended by your health care provider.  Contact your health care provider if you continue to show signs of mild or moderate dehydration. This information is not intended to replace advice given to you by your health care provider. Make sure you discuss any questions you have with  your health care provider. Document Revised: 12/29/2019 Document Reviewed: 11/08/2019 Elsevier Patient Education  2021 [employer]vier Inc.  

## 2020-12-04 ENCOUNTER — Inpatient Hospital Stay: Payer: 59 | Admitting: Oncology

## 2020-12-04 ENCOUNTER — Inpatient Hospital Stay: Payer: 59

## 2020-12-04 ENCOUNTER — Inpatient Hospital Stay (HOSPITAL_BASED_OUTPATIENT_CLINIC_OR_DEPARTMENT_OTHER): Payer: 59 | Admitting: Oncology

## 2020-12-04 ENCOUNTER — Other Ambulatory Visit: Payer: Self-pay

## 2020-12-04 VITALS — BP 111/69 | HR 65 | Temp 97.7°F | Resp 16 | Ht 64.0 in

## 2020-12-04 DIAGNOSIS — Z170 Estrogen receptor positive status [ER+]: Secondary | ICD-10-CM | POA: Diagnosis not present

## 2020-12-04 DIAGNOSIS — Z5112 Encounter for antineoplastic immunotherapy: Secondary | ICD-10-CM | POA: Diagnosis not present

## 2020-12-04 DIAGNOSIS — C50511 Malignant neoplasm of lower-outer quadrant of right female breast: Secondary | ICD-10-CM

## 2020-12-04 DIAGNOSIS — R112 Nausea with vomiting, unspecified: Secondary | ICD-10-CM

## 2020-12-04 DIAGNOSIS — Z95828 Presence of other vascular implants and grafts: Secondary | ICD-10-CM

## 2020-12-04 MED ORDER — SODIUM CHLORIDE 0.9 % IV SOLN
INTRAVENOUS | Status: DC
  Filled 2020-12-04 (×2): qty 250

## 2020-12-04 MED ORDER — HEPARIN SOD (PORK) LOCK FLUSH 100 UNIT/ML IV SOLN
500.0000 [IU] | Freq: Once | INTRAVENOUS | Status: AC
  Administered 2020-12-04: 500 [IU]
  Filled 2020-12-04: qty 5

## 2020-12-04 MED ORDER — RESTASIS 0.05 % OP EMUL: 1.0000 [drp] | Freq: Two times a day (BID) | OPHTHALMIC | 1 refills | Status: DC

## 2020-12-04 MED ORDER — ONDANSETRON HCL 4 MG/2ML IJ SOLN
INTRAMUSCULAR | Status: AC
  Filled 2020-12-04: qty 4

## 2020-12-04 MED ORDER — SODIUM CHLORIDE 0.9% FLUSH
10.0000 mL | Freq: Once | INTRAVENOUS | Status: AC
  Administered 2020-12-04: 10 mL
  Filled 2020-12-04: qty 10

## 2020-12-04 MED ORDER — ONDANSETRON HCL 4 MG/2ML IJ SOLN
8.0000 mg | Freq: Once | INTRAMUSCULAR | Status: AC
  Administered 2020-12-04: 8 mg via INTRAVENOUS

## 2020-12-04 NOTE — Progress Notes (Signed)
[facility]  [phone] [phone]     ID: Erica Wood DOB: 01-Jul-1966  MR#: [id number]  AFB#:[id number]  Patient Care Team: Eunice Blase, MD as PCP - General (Family Medicine) Prudence Davidson, Goshen Bing, MD as Referring Physician (Oncology) Rockwell Germany, RN as Oncology Nurse Navigator Mauro Kaufmann, RN as Oncology Nurse Navigator Rolm Bookbinder, MD as Consulting Physician (General Surgery) Murlin Schrieber, Virgie Dad, MD as Consulting Physician (Oncology) Kyung Rudd, MD as Consulting Physician (Radiation Oncology) Bo Merino, MD as Consulting Physician (Rheumatology) Olga Millers, MD as Consulting Physician (Obstetrics and Gynecology) Larey Dresser, MD as Consulting Physician (Cardiology) Chauncey Cruel, MD OTHER MD:  CHIEF COMPLAINT: triple positive stage IV breast cancer  CURRENT TREATMENT: Neoadjuvant therapy    INTERVAL HISTORY: Erica Wood was scheduled today for a virtual visit with me but she came in for fluids so we switched it to an in person visit.    She began neoadjuvant chemotherapy, consisting of docetaxel, carboplatin, trastuzumab, and pertuzumab every 21 days x6, on 08/31/2020.  She received the fifth cycle on 11/24/2020.  We have eliminated Pertuzumab because of concerns regarding her IBS although she mostly experiences constipation.  She is scheduled for her final chemotherapy dose 12/15/2020.  Of course she will continue Herceptin indefinitely after that  Her most recent echocardiogram from 09/25/2020 showed an ejection fraction of 60-65%.  Her next echo will be due mid February.  She receives the PEGfilgastrim at home on day 3.  She has no problems from this.  She underwent follow up liver MRI on 11/06/2020 showing: about 14 mostly small rim enhancing liver lesion present; two dominant index lesions are smaller than on 08/24/2020, with remaining small lesion roughly stable.    REVIEW OF SYSTEMS: Erica Wood is not  eating or drinking all that much because of significant taste alteration.  She is coming here for intravenous fluids.  She says she feels weaker and it is difficult for her to regain her strength.  She has a little bit of a rash primarily in the upper anterior chest area.  She had slight problems with diarrhea but she is now back to her more common constipation issues.  She had a little bit of vomiting this past weekend which was about day 8 from her chemotherapy.  That has resolved.  She has some discomfort at the nailbeds.  She is not sure whether or not she is having any peripheral neuropathy in the finger pads.  She says her hands have significant Raynaud's and that makes it difficult to tell.  She did go to physical therapy and they found it difficult as well to evaluate.  She says her feet are "like usual".   COVID 19 VACCINATION STATUS: Status post Pfizer x1   HISTORY OF CURRENT ILLNESS: From the original intake note:  Erica Wood herself palpated an abnormality in the right breast several years ago.  This had not changed until sometime in June 2021.  She was evaluated by Dr. Ouida Sills and underwent right diagnostic mammography with tomography and right breast ultrasonography at [facility] on 07/26/2020 showing: breast density category D; 2.6 cm mass in right breast at 6 o'clock, at site of palpable concern; two focal groups of punctate calcifications posterior to palpable mass; no right axillary adenopathy.  Accordingly on 08/04/2020 she proceeded to biopsy of the right breast area in question. The pathology from this procedure ([id number]) showed: invasive ductal carcinoma, grade 3. Prognostic indicators significant for: estrogen receptor, 95% positive  with moderate staining intensity and progesterone receptor, 10% positive with strong staining intensity. Proliferation marker Ki67 at 40%. HER2 equivocal by immunohistochemistry (2+), but positive by fluorescent in situ hybridization with a signals  ratio 3.23 and number per cell 6.45.  Biopsy of the posterior calcifications was benign.  She also underwent breast MRI on 08/11/2020 showing: breast composition D; 2.6 cm biopsy-proven malignancy in right breast at 6 o'clock, with probable skin involvement noted on previous ultrasound; additional enhancing masses within right breast-- 5 mm in lower-outer, 1.2 cm in upper-inner, 7 mm in upper-outer; no evidence of left breast malignancy or lymphadenopathy; heterogeneous enhancement of sternum, of uncertain significance.  The patient's subsequent history is as detailed below.   PAST MEDICAL HISTORY: Past Medical History:  Diagnosis Date  . Anti-cardiolipin antibody positive   . Anti-cardiolipin antibody syndrome (HCC)   . Anxiety   . Arthralgia   . Arthritis    bil knees, hands  . Atypical chest pain   . Autoimmune disease ([place])   . Cancer ([place]) 07/2020   right breast IDC  . Chronic fatigue   . Depression   . DVT (deep venous thrombosis) ([place])   . IBS (irritable bowel syndrome)    with constipation  . Lupus ([place])   . Pulmonary embolus ([place])   . Raynaud's syndrome   . Sicca ([place])   . Thrombocytopenia ([place])     PAST SURGICAL HISTORY: Past Surgical History:  Procedure Laterality Date  . ABLATION  12/2018   leg veins  . ABLATION Right 08/2019   venous leg   . APPENDECTOMY    . COLON SURGERY     Perforated colon repair after colostomy  . GANGLION CYST EXCISION    . HYSTEROSCOPY WITH D & C N/A 01/13/2014   Procedure: DILATATION AND CURETTAGE /HYSTEROSCOPY with resection ;  Surgeon: Olga Millers, MD;  Location: [facility];  Service: Gynecology;  Laterality: N/A;  . LAPAROTOMY    . PORTACATH PLACEMENT N/A 09/13/2020   Procedure: INSERTION PORT-A-CATH WITH ULTRASOUND GUIDANCE;  Surgeon: Rolm Bookbinder, MD;  Location: [place];  Service: General;  Laterality: N/A;    FAMILY HISTORY: Family History  Problem Relation Age of Onset  . Rheum arthritis Father    . Hemachromatosis Father   . Cancer Sister        bone   as of October 2021 her father isage 61 and her mother age 18.. The patient had one sister (and no brothers). The sister was diagnosed with Ewing's sarcoma at age 3, and died from the disease after many difficult treatments.    GYNECOLOGIC HISTORY:  Last menstrual period occurred about 6 months prior to the start of chemotherapy Menarche: 79 or 55 years old GX P 0 LMP around 2019 Contraceptive: yes HRT no  Hysterectomy? no BSO? 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 She is widowed. She lives at home with her cat, Mariea Clonts. She is not a Ambulance person.    ADVANCED DIRECTIVES: not in place; at the 08/16/2020 visit the patient was given the appropriate documents to complete and notarized at her discretion.  She tells me she intends to name her significant other fill as her healthcare power of attorney   HEALTH MAINTENANCE: Social History   Tobacco Use  . Smoking status: Former Smoker    Packs/day: 0.50    Years: 5.00    Pack years: 2.50    Types: Cigarettes  Start date: 08/06/2006    Quit date: 08/07/2011    Years since quitting: 9.3  . Smokeless tobacco: Never Used  Vaping Use  . Vaping Use: Never used  Substance Use Topics  . Alcohol use: Yes    Comment: occasionally  . Drug use: No     Colonoscopy: 2002 for perforated bowel  PAP: 03/2020  Bone density: done at Dr. [facility] office   Allergies  Allergen Reactions  . Beef-Derived Products Hives  . Latex Rash    Contact  . Sulfa Antibiotics Rash    Current Outpatient Medications  Medication Sig Dispense Refill  . acetaminophen (TYLENOL) 500 MG tablet Take 1,000 mg by mouth every 6 (six) hours as needed for moderate pain or headache.    . amphetamine-dextroamphetamine (ADDERALL XR) 20 MG 24 hr capsule Take 1 capsule (20 mg total) by mouth daily. 30 capsule 0  . Ascorbic Acid (VITAMIN C PO) Take 3,000 mg  by mouth daily.     Marland Kitchen CAMILA 0.35 MG tablet Take 1 tablet by mouth daily.  11  . Cholecalciferol (VITAMIN D3) 75 MCG (3000 UT) TABS Take 3,000 Units by mouth daily.    Marland Kitchen dexamethasone (DECADRON) 4 MG tablet TAKE 2 TABLETS(8 MG) BY MOUTH TWICE DAILY. START THE DAY BEFORE TAXOTERE. THEN TAKE DAILY FOR 3 DAYS AFTER CHEMOTHERAPY 30 tablet 1  . Docusate Sodium (COLACE PO) Take 2 tablets by mouth 2 (two) times daily.    . fluconazole (DIFLUCAN) 100 MG tablet TAKE AS DIRECTED 30 tablet 0  . Fluocinonide 0.1 % CREA Apply 1 application topically 2 (two) times daily. 240 g 2  . hydroxychloroquine (PLAQUENIL) 200 MG tablet Take one tablet by mouth twice daily Monday through Friday. 120 tablet 0  . lidocaine-prilocaine (EMLA) cream Apply to affected area once (Patient taking differently: Apply 1 application topically daily as needed (port access).) 30 g 3  . loratadine (CLARITIN) 10 MG tablet Take 10 mg by mouth See admin instructions. Take 10 mg daily for 5 days after chemo treatments    . LORazepam (ATIVAN) 0.5 MG tablet TAKE 1 TABLET(0.5 MG) BY MOUTH AT BEDTIME AS NEEDED FOR NAUSEA OR VOMITING 30 tablet 0  . magic mouthwash SOLN Take 5 mLs by mouth 4 (four) times daily as needed for mouth pain. 240 mL 0  . metoCLOPramide (REGLAN) 10 MG tablet Take 1 tablet (10 mg total) by mouth every 6 (six) hours as needed for nausea. 40 tablet 2  . metroNIDAZOLE (METROGEL) 1 % gel Apply topically daily. 45 g 0  . oxyCODONE (OXY IR/ROXICODONE) 5 MG immediate release tablet Take 1 tablet (5 mg total) by mouth every 6 (six) hours as needed. 6 tablet 0  . Pegfilgrastim (NEULASTA [place]) Inject 1 Dose into the skin See admin instructions. Inject 24 to 72 hours after chemo treatment    . polyethylene glycol (MIRALAX / GLYCOLAX) 17 g packet Take 17 g by mouth daily.    . prochlorperazine (COMPAZINE) 10 MG tablet Take 0.5-1 tablets (5-10 mg total) by mouth 4 (four) times daily -  before meals and at bedtime. Start the evening of  chemotherapy and take for 2 days, then take as needed 30 tablet 1  . spironolactone (ALDACTONE) 25 MG tablet Take 25 mg by mouth daily as needed (swelling).    . traMADol (ULTRAM) 50 MG tablet Take 1-2 tablets (50-100 mg total) by mouth every 6 (six) hours as needed. (Patient taking differently: Take 50-100 mg by mouth every 6 (six) hours  as needed for moderate pain.) 60 tablet 0  . tretinoin (RETIN-A) 0.1 % cream Apply 1 application topically at bedtime as needed (acne).     . triamcinolone lotion (KENALOG) 0.1 % Apply 1 application topically 3 (three) times daily. 120 mL 2  . triamterene-hydrochlorothiazide (DYAZIDE) 37.5-25 MG capsule TAKE 1 CAPSULE BY MOUTH DAILY AS NEEDED FOR SWELLING 90 capsule 0  . UNABLE TO FIND Green Vibrance - 1 scoop daily    . warfarin (COUMADIN) 5 MG tablet Take 1 tablet (5 mg total) by mouth daily. 30 tablet 11   No current facility-administered medications for this visit.   Facility-Administered Medications Ordered in Other Visits  Medication Dose Route Frequency Provider Last Rate Last Admin  . 0.9 %  sodium chloride infusion   Intravenous Continuous Rebakah Cokley, Virgie Dad, MD 500 mL/hr at 12/04/20 1032 New Bag at 12/04/20 1032  . heparin lock flush 100 unit/mL  500 Units Intracatheter Once Oliviya Gilkison, Virgie Dad, MD      . sodium chloride flush (NS) 0.9 % injection 10 mL  10 mL Intracatheter Once Diem Pagnotta, Virgie Dad, MD        OBJECTIVE: White woman examined in a recliner while receiving IV fluids  There were no vitals filed for this visit.   There is no height or weight on file to calculate BMI.   Wt Readings from Last 3 Encounters:  11/24/20 113 lb 1.6 oz (51.3 kg)  11/16/20 111 lb 8 oz (50.6 kg)  11/06/20 110 lb 1.6 oz (49.9 kg)      ECOG FS:2 - Symptomatic, <50% confined to bed  Sclerae unicteric, EOMs intact Wearing a mask No cervical or supraclavicular adenopathy Lungs no rales or rhonchi Heart regular rate and rhythm Abd soft, nontender, positive  bowel sounds MSK no focal spinal tenderness, no upper extremity lymphedema Neuro: nonfocal, well oriented, appropriate affect Breasts: Deferred   LAB RESULTS:  CMP     Component Value Date/Time   NA 136 11/24/2020 1125   NA 137 07/28/2020 1108   K 3.6 11/24/2020 1125   CL 100 11/24/2020 1125   CO2 29 11/24/2020 1125   GLUCOSE 122 (H) 11/24/2020 1125   BUN 17 11/24/2020 1125   BUN 8 07/28/2020 1108   CREATININE 0.79 11/24/2020 1125   CREATININE 0.78 10/20/2020 1330   CALCIUM 9.0 11/24/2020 1125   PROT 6.0 (L) 11/24/2020 1125   PROT 7.3 07/28/2020 1108   ALBUMIN 3.6 11/24/2020 1125   ALBUMIN 4.9 07/28/2020 1108   AST 16 11/24/2020 1125   AST 19 09/21/2020 0827   ALT 13 11/24/2020 1125   ALT 27 09/21/2020 0827   ALKPHOS 66 11/24/2020 1125   BILITOT 0.2 (L) 11/24/2020 1125   BILITOT 0.4 09/21/2020 0827   GFRNONAA >60 11/24/2020 1125   GFRNONAA >60 10/20/2020 1330   GFRAA 95 07/28/2020 1108    No results found for: TOTALPROTELP, ALBUMINELP, A1GS, A2GS, BETS, BETA2SER, GAMS, MSPIKE, SPEI  Lab Results  Component Value Date   WBC 8.6 11/24/2020   NEUTROABS 5.7 11/24/2020   HGB 9.7 (L) 11/24/2020   HCT 30.1 (L) 11/24/2020   MCV 101.7 (H) 11/24/2020   PLT 278 11/24/2020    No results found for: LABCA2  No components found for: LNLGXQ119  No results for input(s): INR in the last 168 hours.  No results found for: LABCA2  No results found for: ERD408  No results found for: XKG818  No results found for: HUD149  No results found for: FW2637  No components found for: HGQUANT  No results found for: CEA1 / No results found for: CEA1   No results found for: AFPTUMOR  No results found for: CHROMOGRNA  No results found for: KPAFRELGTCHN, LAMBDASER, KAPLAMBRATIO (kappa/lambda light chains)  No results found for: HGBA, HGBA2QUANT, HGBFQUANT, HGBSQUAN (Hemoglobinopathy evaluation)   No results found for: LDH  Lab Results  Component Value Date   IRON 88  10/29/2018   IRON 88 10/29/2018   TIBC 241 (L) 10/29/2018   TIBC 241 (L) 10/29/2018   IRONPCTSAT 37 10/29/2018   IRONPCTSAT 37 10/29/2018   (Iron and TIBC)  Lab Results  Component Value Date   FERRITIN 43 10/29/2018   FERRITIN 43 10/29/2018    Urinalysis    Component Value Date/Time   APPEARANCEUR Clear 08/08/2020 1359   GLUCOSEU Negative 08/08/2020 1359   BILIRUBINUR Negative 08/08/2020 1359   PROTEINUR Negative 08/08/2020 1359   NITRITE Negative 08/08/2020 1359   LEUKOCYTESUR Negative 08/08/2020 1359    STUDIES: MR LIVER W WO CONTRAST  Result Date: 11/07/2020 CLINICAL DATA:  Breast cancer metastatic to the liver, currently undergoing chemotherapy, restaging assessment. EXAM: MRI ABDOMEN WITHOUT AND WITH CONTRAST TECHNIQUE: Multiplanar multisequence MR imaging of the abdomen was performed both before and after the administration of intravenous contrast. CONTRAST:  37m MULTIHANCE GADOBENATE DIMEGLUMINE 529 MG/ML IV SOLN COMPARISON:  08/24/2020 FINDINGS: Lower chest: Unremarkable Hepatobiliary: Small dependent gallstone in the gallbladder on image 25 of series 5, about 0.3 cm in diameter. No biliary dilatation. About 14 mostly small rim enhancing liver lesions are present. One of the larger lesions is in the left hepatic lobe on image 18 of series 10 and measures 0.8 by 0.8 cm, previously 1.1 by 0.9 cm by my measurements. Another lesion is present in the caudate lobe and measures 1.7 by 1.5 cm on image 25 of series 10, previously 2.6 by 2.1 cm by my measurements. Most of the remaining lesions are substantially smaller than these 2 index lesions, and roughly similar to the prior exam. There is some transient hepatic attenuation difference on early arterial phase images in segment 6. Pancreas:  Unremarkable Spleen:  Unremarkable Adrenals/Urinary Tract:  Unremarkable Stomach/Bowel: Prominent stool throughout the colon favors constipation. Vascular/Lymphatic:  Unremarkable Other:  No  supplemental non-categorized findings. Musculoskeletal: Unremarkable IMPRESSION: 1. About 14 mostly small rim enhancing liver lesions are present. The two dominant index lesions are moderately smaller than on 08/24/2020, with the remaining small lesions roughly stable. 2. Prominent stool throughout the colon favors constipation. 3. Small dependent gallstone in the gallbladder. Electronically Signed   By: WVan ClinesM.D.   On: 11/07/2020 08:33     ELIGIBLE FOR AVAILABLE RESEARCH PROTOCOL: AET  ASSESSMENT: 55y.o. Erica Wood woman status post right breast lower outer quadrant biopsy 08/04/2020 for a clinical T2 N0 M1, stage IV invasive ductal carcinoma, grade 3, triple positive, with an MIB-1 of 40%  (a) breast MRI 08/14/2020 shows 3 additional right breast lesions and heterogeneous enhancement of the sternum  (b) biopsy of the additional right breast mass 09/05/2020 showed atypical lobular hyperplasia (concordant).  (c) CT scan of the abdomen and pelvis 07/14/2020 shows 2 indeterminate liver lesions  (d) abdominal MRI 08/24/2020 shows multiple liver lesions highly suspicious for liver metastases.  (e) liver biopsy 09/20/2020 positive for carcinoma, prognostic panel estrogen and progesterone receptor positive, but HER-2 negative; MIB-130%  (f) bone scan 09/14/2020 shows no evidence of bone metastases  (g) CT scan of the chest and head 09/14/2020 showed no evidence  of metastatic disease in the chest   (1) neoadjuvant chemotherapy to consist of docetaxel, carboplatin, trastuzumab and pertuzumab every 21 days x 6 started 08/31/2020  (a) pertuzumab omitted starting with cycle 4  (2) trastuzumab to continue indefinitely  (a) echo 04/12/2020 shows an ejection fraction in the 60-65% range  (b) echo 09/25/2020 shows an ejection fraction in the 60 to 65% range.  (3) definitive surgery to follow  (4) adjuvant radiation as appropriate  (5) antiestrogens to start at the completion of local  treatment   PLAN: Malaiyah is receiving fluids today and several other times in the next couple of weeks, on an as-needed basis.  This has helped her get through her chemotherapy.  It is very difficult for me to tell whether she is having peripheral neuropathy or not.  She is certainly having tearing.  I think it would be prudent to admit the docetaxel from the sixth cycle.  We will switch capecitabine for that single treatment.  I am writing for [facility] for her epiphora  She tells me she has already been scheduled for post chemo breast MRI and has an appointment with her surgeon  She will see me just before her 6 chemotherapy cycle.  She knows to call for any other issue that may develop before then  Total encounter time 25 minutes.Sarajane Jews C. Karthika Glasper, MD 12/04/2020 12:39 PM Medical Oncology and Hematology [facility] [place], [place] [address] Tel. [phone]    Fax. [phone]   This document serves as a record of services personally performed by Lurline Del, MD. It was created on his behalf by Wilburn Mylar, a trained medical scribe. The creation of this record is based on the scribe's personal observations and the provider's statements to them.   I, Lurline Del MD, have reviewed the above documentation for accuracy and completeness, and I agree with the above.   *Total Encounter Time as defined by the Centers for Medicare and Medicaid Services includes, in addition to the face-to-face time of a patient visit (documented in the note above) non-face-to-face time: obtaining and reviewing outside history, ordering and reviewing medications, tests or procedures, care coordination (communications with other health care professionals or caregivers) and documentation in the medical record.

## 2020-12-04 NOTE — Patient Instructions (Signed)
Rehydration, Adult Rehydration is the replacement of body fluids, salts, and minerals (electrolytes) that are lost during dehydration. Dehydration is when there is not enough water or other fluids in the body. This happens when you lose more fluids than you take in. Common causes of dehydration include:  Not drinking enough fluids. This can occur when you are ill or doing activities that require a lot of energy, especially in hot weather.  Conditions that cause loss of water or other fluids, such as diarrhea, vomiting, sweating, or urinating a lot.  Other illnesses, such as fever or infection.  Certain medicines, such as those that remove excess fluid from the body (diuretics). Symptoms of mild or moderate dehydration may include thirst, dry lips and mouth, and dizziness. Symptoms of severe dehydration may include increased heart rate, confusion, fainting, and not urinating. For severe dehydration, you may need to get fluids through an IV at the hospital. For mild or moderate dehydration, you can usually rehydrate at home by drinking certain fluids as told by your health care provider. What are the risks? Generally, rehydration is safe. However, taking in too much fluid (overhydration) can be a problem. This is rare. Overhydration can cause an electrolyte imbalance, kidney failure, or a decrease in salt (sodium) levels in the body. Supplies needed You will need an oral rehydration solution (ORS) if your health care provider tells you to use one. This is a drink to treat dehydration. It can be found in pharmacies and retail stores. How to rehydrate Fluids Follow instructions from your health care provider for rehydration. The kind of fluid and the amount you should drink depend on your condition. In general, you should choose drinks that you prefer.  If told by your health care provider, drink an ORS. ? Make an ORS by following instructions on the package. ? Start by drinking small amounts,  about  cup (120 mL) every 5-10 minutes. ? Slowly increase how much you drink until you have taken the amount recommended by your health care provider.  Drink enough clear fluids to keep your urine pale yellow. If you were told to drink an ORS, finish it first, then start slowly drinking other clear fluids. Drink fluids such as: ? Water. This includes sparkling water and flavored water. Drinking only water can lead to having too little sodium in your body (hyponatremia). Follow the advice of your health care provider. ? Water from ice chips you suck on. ? Fruit juice with water you add to it (diluted). ? Sports drinks. ? Hot or cold herbal teas. ? Broth-based soups. ? Milk or milk products. Food Follow instructions from your health care provider about what to eat while you rehydrate. Your health care provider may recommend that you slowly begin eating regular foods in small amounts.  Eat foods that contain a healthy balance of electrolytes, such as bananas, oranges, potatoes, tomatoes, and spinach.  Avoid foods that are greasy or contain a lot of sugar. In some cases, you may get nutrition through a feeding tube that is passed through your nose and into your stomach (nasogastric tube, or NG tube). This may be done if you have uncontrolled vomiting or diarrhea.   Beverages to avoid Certain beverages may make dehydration worse. While you rehydrate, avoid drinking alcohol.   How to tell if you are recovering from dehydration You may be recovering from dehydration if:  You are urinating more often than before you started rehydrating.  Your urine is pale yellow.  Your energy level   improves.  You vomit less frequently.  You have diarrhea less frequently.  Your appetite improves or returns to normal.  You feel less dizzy or less light-headed.  Your skin tone and color start to look more normal. Follow these instructions at home:  Take over-the-counter and prescription medicines only  as told by your health care provider.  Do not take sodium tablets. Doing this can lead to having too much sodium in your body (hypernatremia). Contact a health care provider if:  You continue to have symptoms of mild or moderate dehydration, such as: ? Thirst. ? Dry lips. ? Slightly dry mouth. ? Dizziness. ? Dark urine or less urine than normal. ? Muscle cramps.  You continue to vomit or have diarrhea. Get help right away if you:  Have symptoms of dehydration that get worse.  Have a fever.  Have a severe headache.  Have been vomiting and the following happens: ? Your vomiting gets worse or does not go away. ? Your vomit includes blood or green matter (bile). ? You cannot eat or drink without vomiting.  Have problems with urination or bowel movements, such as: ? Diarrhea that gets worse or does not go away. ? Blood in your stool (feces). This may cause stool to look black and tarry. ? Not urinating, or urinating only a small amount of very dark urine, within 6-8 hours.  Have trouble breathing.  Have symptoms that get worse with treatment. These symptoms may represent a serious problem that is an emergency. Do not wait to see if the symptoms will go away. Get medical help right away. Call your local emergency services (911 in the [place] Do not drive yourself to the hospital. Summary  Rehydration is the replacement of body fluids and minerals (electrolytes) that are lost during dehydration.  Follow instructions from your health care provider for rehydration. The kind of fluid and amount you should drink depend on your condition.  Slowly increase how much you drink until you have taken the amount recommended by your health care provider.  Contact your health care provider if you continue to show signs of mild or moderate dehydration. This information is not intended to replace advice given to you by your health care provider. Make sure you discuss any questions you have with  your health care provider. Document Revised: 12/29/2019 Document Reviewed: 11/08/2019 Elsevier Patient Education  2021 [employer]vier Inc.  

## 2020-12-05 ENCOUNTER — Telehealth: Payer: Self-pay | Admitting: *Deleted

## 2020-12-05 ENCOUNTER — Inpatient Hospital Stay: Payer: 59

## 2020-12-05 NOTE — Telephone Encounter (Signed)
Pt contacted to see if she is coming for fluids today, pt states she called the front desk, wants to change her fluid appointments to Wednesday & Friday this week.

## 2020-12-06 ENCOUNTER — Other Ambulatory Visit: Payer: Self-pay

## 2020-12-06 ENCOUNTER — Telehealth: Payer: Self-pay | Admitting: Hematology

## 2020-12-06 ENCOUNTER — Ambulatory Visit: Payer: 59

## 2020-12-06 DIAGNOSIS — M6281 Muscle weakness (generalized): Secondary | ICD-10-CM | POA: Diagnosis not present

## 2020-12-06 DIAGNOSIS — C50511 Malignant neoplasm of lower-outer quadrant of right female breast: Secondary | ICD-10-CM

## 2020-12-06 DIAGNOSIS — R293 Abnormal posture: Secondary | ICD-10-CM

## 2020-12-06 DIAGNOSIS — Z170 Estrogen receptor positive status [ER+]: Secondary | ICD-10-CM
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
## 2020-12-06 NOTE — Addendum Note (Signed)
Addended by: Arbutus Ped C on: 12/06/2020 03:50 PM   Modules accepted: Orders

## 2020-12-06 NOTE — Telephone Encounter (Signed)
No 1/24 los. No changes made to pt's schedule.  

## 2020-12-07 ENCOUNTER — Inpatient Hospital Stay: Payer: 59

## 2020-12-07 ENCOUNTER — Other Ambulatory Visit: Payer: Self-pay

## 2020-12-07 ENCOUNTER — Other Ambulatory Visit: Payer: 59

## 2020-12-07 VITALS — BP 109/67 | HR 80 | Temp 98.6°F | Resp 18

## 2020-12-07 DIAGNOSIS — Z95828 Presence of other vascular implants and grafts: Secondary | ICD-10-CM

## 2020-12-07 DIAGNOSIS — Z5112 Encounter for antineoplastic immunotherapy: Secondary | ICD-10-CM | POA: Diagnosis not present

## 2020-12-07 MED ORDER — SODIUM CHLORIDE 0.9% FLUSH
10.0000 mL | Freq: Once | INTRAVENOUS | Status: AC
  Administered 2020-12-07: 10 mL
  Filled 2020-12-07: qty 10

## 2020-12-07 MED ORDER — HEPARIN SOD (PORK) LOCK FLUSH 100 UNIT/ML IV SOLN
500.0000 [IU] | Freq: Once | INTRAVENOUS | Status: AC
  Administered 2020-12-07: 500 [IU]
  Filled 2020-12-07: qty 5

## 2020-12-07 MED ORDER — SODIUM CHLORIDE 0.9 % IV SOLN
INTRAVENOUS | Status: DC
  Filled 2020-12-07 (×2): qty 250

## 2020-12-07 NOTE — Patient Instructions (Signed)
Rehydration, Adult Rehydration is the replacement of body fluids, salts, and minerals (electrolytes) that are lost during dehydration. Dehydration is when there is not enough water or other fluids in the body. This happens when you lose more fluids than you take in. Common causes of dehydration include:  Not drinking enough fluids. This can occur when you are ill or doing activities that require a lot of energy, especially in hot weather.  Conditions that cause loss of water or other fluids, such as diarrhea, vomiting, sweating, or urinating a lot.  Other illnesses, such as fever or infection.  Certain medicines, such as those that remove excess fluid from the body (diuretics). Symptoms of mild or moderate dehydration may include thirst, dry lips and mouth, and dizziness. Symptoms of severe dehydration may include increased heart rate, confusion, fainting, and not urinating. For severe dehydration, you may need to get fluids through an IV at the hospital. For mild or moderate dehydration, you can usually rehydrate at home by drinking certain fluids as told by your health care provider. What are the risks? Generally, rehydration is safe. However, taking in too much fluid (overhydration) can be a problem. This is rare. Overhydration can cause an electrolyte imbalance, kidney failure, or a decrease in salt (sodium) levels in the body. Supplies needed You will need an oral rehydration solution (ORS) if your health care provider tells you to use one. This is a drink to treat dehydration. It can be found in pharmacies and retail stores. How to rehydrate Fluids Follow instructions from your health care provider for rehydration. The kind of fluid and the amount you should drink depend on your condition. In general, you should choose drinks that you prefer.  If told by your health care provider, drink an ORS. ? Make an ORS by following instructions on the package. ? Start by drinking small amounts,  about  cup (120 mL) every 5-10 minutes. ? Slowly increase how much you drink until you have taken the amount recommended by your health care provider.  Drink enough clear fluids to keep your urine pale yellow. If you were told to drink an ORS, finish it first, then start slowly drinking other clear fluids. Drink fluids such as: ? Water. This includes sparkling water and flavored water. Drinking only water can lead to having too little sodium in your body (hyponatremia). Follow the advice of your health care provider. ? Water from ice chips you suck on. ? Fruit juice with water you add to it (diluted). ? Sports drinks. ? Hot or cold herbal teas. ? Broth-based soups. ? Milk or milk products. Food Follow instructions from your health care provider about what to eat while you rehydrate. Your health care provider may recommend that you slowly begin eating regular foods in small amounts.  Eat foods that contain a healthy balance of electrolytes, such as bananas, oranges, potatoes, tomatoes, and spinach.  Avoid foods that are greasy or contain a lot of sugar. In some cases, you may get nutrition through a feeding tube that is passed through your nose and into your stomach (nasogastric tube, or NG tube). This may be done if you have uncontrolled vomiting or diarrhea.   Beverages to avoid Certain beverages may make dehydration worse. While you rehydrate, avoid drinking alcohol.   How to tell if you are recovering from dehydration You may be recovering from dehydration if:  You are urinating more often than before you started rehydrating.  Your urine is pale yellow.  Your energy level   improves.  You vomit less frequently.  You have diarrhea less frequently.  Your appetite improves or returns to normal.  You feel less dizzy or less light-headed.  Your skin tone and color start to look more normal. Follow these instructions at home:  Take over-the-counter and prescription medicines only  as told by your health care provider.  Do not take sodium tablets. Doing this can lead to having too much sodium in your body (hypernatremia). Contact a health care provider if:  You continue to have symptoms of mild or moderate dehydration, such as: ? Thirst. ? Dry lips. ? Slightly dry mouth. ? Dizziness. ? Dark urine or less urine than normal. ? Muscle cramps.  You continue to vomit or have diarrhea. Get help right away if you:  Have symptoms of dehydration that get worse.  Have a fever.  Have a severe headache.  Have been vomiting and the following happens: ? Your vomiting gets worse or does not go away. ? Your vomit includes blood or green matter (bile). ? You cannot eat or drink without vomiting.  Have problems with urination or bowel movements, such as: ? Diarrhea that gets worse or does not go away. ? Blood in your stool (feces). This may cause stool to look black and tarry. ? Not urinating, or urinating only a small amount of very dark urine, within 6-8 hours.  Have trouble breathing.  Have symptoms that get worse with treatment. These symptoms may represent a serious problem that is an emergency. Do not wait to see if the symptoms will go away. Get medical help right away. Call your local emergency services (911 in the [place] Do not drive yourself to the hospital. Summary  Rehydration is the replacement of body fluids and minerals (electrolytes) that are lost during dehydration.  Follow instructions from your health care provider for rehydration. The kind of fluid and amount you should drink depend on your condition.  Slowly increase how much you drink until you have taken the amount recommended by your health care provider.  Contact your health care provider if you continue to show signs of mild or moderate dehydration. This information is not intended to replace advice given to you by your health care provider. Make sure you discuss any questions you have with  your health care provider. Document Revised: 12/29/2019 Document Reviewed: 11/08/2019 Elsevier Patient Education  2021 [employer]vier Inc.  

## 2020-12-08 ENCOUNTER — Inpatient Hospital Stay: Payer: 59

## 2020-12-08 ENCOUNTER — Other Ambulatory Visit: Payer: Self-pay

## 2020-12-08 VITALS — BP 107/68 | HR 78 | Temp 98.3°F | Resp 18

## 2020-12-08 DIAGNOSIS — Z5112 Encounter for antineoplastic immunotherapy: Secondary | ICD-10-CM | POA: Diagnosis not present

## 2020-12-08 DIAGNOSIS — Z95828 Presence of other vascular implants and grafts: Secondary | ICD-10-CM

## 2020-12-08 MED ORDER — SODIUM CHLORIDE 0.9 % IV SOLN
INTRAVENOUS | Status: DC
  Filled 2020-12-08 (×2): qty 250

## 2020-12-08 MED ORDER — HEPARIN SOD (PORK) LOCK FLUSH 100 UNIT/ML IV SOLN
500.0000 [IU] | Freq: Once | INTRAVENOUS | Status: AC
  Administered 2020-12-08: 500 [IU]
  Filled 2020-12-08: qty 5

## 2020-12-08 MED ORDER — SODIUM CHLORIDE 0.9% FLUSH
10.0000 mL | Freq: Once | INTRAVENOUS | Status: AC
  Administered 2020-12-08: 10 mL
  Filled 2020-12-08: qty 10

## 2020-12-08 NOTE — Patient Instructions (Signed)
Rehydration, Adult Rehydration is the replacement of body fluids, salts, and minerals (electrolytes) that are lost during dehydration. Dehydration is when there is not enough water or other fluids in the body. This happens when you lose more fluids than you take in. Common causes of dehydration include:  Not drinking enough fluids. This can occur when you are ill or doing activities that require a lot of energy, especially in hot weather.  Conditions that cause loss of water or other fluids, such as diarrhea, vomiting, sweating, or urinating a lot.  Other illnesses, such as fever or infection.  Certain medicines, such as those that remove excess fluid from the body (diuretics). Symptoms of mild or moderate dehydration may include thirst, dry lips and mouth, and dizziness. Symptoms of severe dehydration may include increased heart rate, confusion, fainting, and not urinating. For severe dehydration, you may need to get fluids through an IV at the hospital. For mild or moderate dehydration, you can usually rehydrate at home by drinking certain fluids as told by your health care provider. What are the risks? Generally, rehydration is safe. However, taking in too much fluid (overhydration) can be a problem. This is rare. Overhydration can cause an electrolyte imbalance, kidney failure, or a decrease in salt (sodium) levels in the body. Supplies needed You will need an oral rehydration solution (ORS) if your health care provider tells you to use one. This is a drink to treat dehydration. It can be found in pharmacies and retail stores. How to rehydrate Fluids Follow instructions from your health care provider for rehydration. The kind of fluid and the amount you should drink depend on your condition. In general, you should choose drinks that you prefer.  If told by your health care provider, drink an ORS. ? Make an ORS by following instructions on the package. ? Start by drinking small amounts,  about  cup (120 mL) every 5-10 minutes. ? Slowly increase how much you drink until you have taken the amount recommended by your health care provider.  Drink enough clear fluids to keep your urine pale yellow. If you were told to drink an ORS, finish it first, then start slowly drinking other clear fluids. Drink fluids such as: ? Water. This includes sparkling water and flavored water. Drinking only water can lead to having too little sodium in your body (hyponatremia). Follow the advice of your health care provider. ? Water from ice chips you suck on. ? Fruit juice with water you add to it (diluted). ? Sports drinks. ? Hot or cold herbal teas. ? Broth-based soups. ? Milk or milk products. Food Follow instructions from your health care provider about what to eat while you rehydrate. Your health care provider may recommend that you slowly begin eating regular foods in small amounts.  Eat foods that contain a healthy balance of electrolytes, such as bananas, oranges, potatoes, tomatoes, and spinach.  Avoid foods that are greasy or contain a lot of sugar. In some cases, you may get nutrition through a feeding tube that is passed through your nose and into your stomach (nasogastric tube, or NG tube). This may be done if you have uncontrolled vomiting or diarrhea.   Beverages to avoid Certain beverages may make dehydration worse. While you rehydrate, avoid drinking alcohol.   How to tell if you are recovering from dehydration You may be recovering from dehydration if:  You are urinating more often than before you started rehydrating.  Your urine is pale yellow.  Your energy level   improves.  You vomit less frequently.  You have diarrhea less frequently.  Your appetite improves or returns to normal.  You feel less dizzy or less light-headed.  Your skin tone and color start to look more normal. Follow these instructions at home:  Take over-the-counter and prescription medicines only  as told by your health care provider.  Do not take sodium tablets. Doing this can lead to having too much sodium in your body (hypernatremia). Contact a health care provider if:  You continue to have symptoms of mild or moderate dehydration, such as: ? Thirst. ? Dry lips. ? Slightly dry mouth. ? Dizziness. ? Dark urine or less urine than normal. ? Muscle cramps.  You continue to vomit or have diarrhea. Get help right away if you:  Have symptoms of dehydration that get worse.  Have a fever.  Have a severe headache.  Have been vomiting and the following happens: ? Your vomiting gets worse or does not go away. ? Your vomit includes blood or green matter (bile). ? You cannot eat or drink without vomiting.  Have problems with urination or bowel movements, such as: ? Diarrhea that gets worse or does not go away. ? Blood in your stool (feces). This may cause stool to look black and tarry. ? Not urinating, or urinating only a small amount of very dark urine, within 6-8 hours.  Have trouble breathing.  Have symptoms that get worse with treatment. These symptoms may represent a serious problem that is an emergency. Do not wait to see if the symptoms will go away. Get medical help right away. Call your local emergency services (911 in the [place] Do not drive yourself to the hospital. Summary  Rehydration is the replacement of body fluids and minerals (electrolytes) that are lost during dehydration.  Follow instructions from your health care provider for rehydration. The kind of fluid and amount you should drink depend on your condition.  Slowly increase how much you drink until you have taken the amount recommended by your health care provider.  Contact your health care provider if you continue to show signs of mild or moderate dehydration. This information is not intended to replace advice given to you by your health care provider. Make sure you discuss any questions you have with  your health care provider. Document Revised: 12/29/2019 Document Reviewed: 11/08/2019 Elsevier Patient Education  2021 [employer]vier Inc.  

## 2020-12-09 ENCOUNTER — Ambulatory Visit: Payer: 59

## 2020-12-11 ENCOUNTER — Other Ambulatory Visit: Payer: Self-pay | Admitting: Family Medicine

## 2020-12-11 ENCOUNTER — Other Ambulatory Visit: Payer: Self-pay

## 2020-12-11 MED ORDER — RIVAROXABAN 15 MG PO TABS: 15.0000 mg | ORAL_TABLET | Freq: Every day | ORAL | 3 refills | Status: DC

## 2020-12-11 MED ORDER — AMPHETAMINE-DEXTROAMPHET ER 20 MG PO CP24: 20.0000 mg | ORAL_CAPSULE | Freq: Every day | ORAL | 0 refills | Status: DC

## 2020-12-11 NOTE — Telephone Encounter (Signed)
Last Visit: 07/27/2020 Next Visit: 12/28/2020  Current Dose per office note on 07/27/2020, not discussed Dx: Autoimmune disease   Okay to refill Adderall?

## 2020-12-11 NOTE — Telephone Encounter (Signed)
Patient called requesting prescription refill of Adderall to be sent to [facility] at [address].   

## 2020-12-12 ENCOUNTER — Encounter: Payer: Self-pay | Admitting: Licensed Clinical Social Worker

## 2020-12-12 NOTE — Progress Notes (Signed)
[facility]  [phone] [phone]     ID: Clariza Sickman Swindell DOB: 12-27-1965  MR#: [id number]  ONG#:[id number]  Patient Care Team: Eunice Blase, MD as PCP - General (Family Medicine) Prudence Davidson, [place] Bing, MD as Referring Physician (Oncology) Rockwell Germany, RN as Oncology Nurse Navigator Mauro Kaufmann, RN as Oncology Nurse Navigator Rolm Bookbinder, MD as Consulting Physician (General Surgery) Edward Guthmiller, Virgie Dad, MD as Consulting Physician (Oncology) Kyung Rudd, MD as Consulting Physician (Radiation Oncology) Bo Merino, MD as Consulting Physician (Rheumatology) Olga Millers, MD as Consulting Physician (Obstetrics and Gynecology) Larey Dresser, MD as Consulting Physician (Cardiology) Chauncey Cruel, MD OTHER MD:  CHIEF COMPLAINT: triple positive stage IV breast cancer  CURRENT TREATMENT: Neoadjuvant therapy    INTERVAL HISTORY: Milania returns today for follow up and treatment of her triple positive breast cancer.    She began neoadjuvant chemotherapy, consisting of docetaxel, carboplatin, trastuzumab, and pertuzumab every 21 days x6, on 08/31/2020.  She received the fifth cycle on 11/24/2020.  We have eliminated Pertuzumab because of concerns regarding her IBS although she mostly experiences constipation.  She is scheduled for her final chemotherapy dose 12/15/2020.  Of course she will continue Herceptin indefinitely after that  Her most recent echocardiogram from 09/25/2020 showed an ejection fraction of 60-65%.  Her next echo will be due mid February.  She receives the PEGfilgastrim at home on day 3.  She has no problems from this.  She is scheduled for breast MRI on 12/26/2020 and to see Dr. Donne Hazel for discussion regarding definitive surgery on 12/29/2020   REVIEW OF SYSTEMS: Elizette got the Restasis and is using it appropriately.  The eyes are a little bit better but she still tearing some.  Hopefully this will  eventually clear so she does not need to get rods placed in her tear ducts.  She understands she does not need to take the dexamethasone today for treatment tomorrow since we have eliminated the Taxotere.  She will receive Gemzar instead.  We have set her up for fluids the following week which has been very helpful to her in the past.  A detailed review of systems today was otherwise stable   COVID 19 VACCINATION STATUS: Status post Pfizer x1   HISTORY OF CURRENT ILLNESS: From the original intake note:  Stachia herself palpated an abnormality in the right breast several years ago.  This had not changed until sometime in June 2021.  She was evaluated by Dr. Ouida Sills and underwent right diagnostic mammography with tomography and right breast ultrasonography at [facility] on 07/26/2020 showing: breast density category D; 2.6 cm mass in right breast at 6 o'clock, at site of palpable concern; two focal groups of punctate calcifications posterior to palpable mass; no right axillary adenopathy.  Accordingly on 08/04/2020 she proceeded to biopsy of the right breast area in question. The pathology from this procedure ([id number]) showed: invasive ductal carcinoma, grade 3. Prognostic indicators significant for: estrogen receptor, 95% positive with moderate staining intensity and progesterone receptor, 10% positive with strong staining intensity. Proliferation marker Ki67 at 40%. HER2 equivocal by immunohistochemistry (2+), but positive by fluorescent in situ hybridization with a signals ratio 3.23 and number per cell 6.45.  Biopsy of the posterior calcifications was benign.  She also underwent breast MRI on 08/11/2020 showing: breast composition D; 2.6 cm biopsy-proven malignancy in right breast at 6 o'clock, with probable skin involvement noted on previous ultrasound; additional enhancing masses within right breast-- 5 mm  in lower-outer, 1.2 cm in upper-inner, 7 mm in upper-outer; no evidence of left  breast malignancy or lymphadenopathy; heterogeneous enhancement of sternum, of uncertain significance.  The patient's subsequent history is as detailed below.   PAST MEDICAL HISTORY: Past Medical History:  Diagnosis Date   Anti-cardiolipin antibody positive    Anti-cardiolipin antibody syndrome (HCC)    Anxiety    Arthralgia    Arthritis    bil knees, hands   Atypical chest pain    Autoimmune disease ([place])    Cancer ([place]) 07/2020   right breast IDC   Chronic fatigue    Depression    DVT (deep venous thrombosis) (HCC)    IBS (irritable bowel syndrome)    with constipation   Lupus ([place])    Pulmonary embolus (HCC)    Raynaud's syndrome    Sicca ([place])    Thrombocytopenia ([place])     PAST SURGICAL HISTORY: Past Surgical History:  Procedure Laterality Date   ABLATION  12/2018   leg veins   ABLATION Right 08/2019   venous leg    APPENDECTOMY     COLON SURGERY     Perforated colon repair after colostomy   GANGLION CYST EXCISION     HYSTEROSCOPY WITH D & C N/A 01/13/2014   Procedure: DILATATION AND CURETTAGE /HYSTEROSCOPY with resection ;  Surgeon: Olga Millers, MD;  Location: [facility];  Service: Gynecology;  Laterality: N/A;   LAPAROTOMY     PORTACATH PLACEMENT N/A 09/13/2020   Procedure: INSERTION PORT-A-CATH WITH ULTRASOUND GUIDANCE;  Surgeon: Rolm Bookbinder, MD;  Location: [facility];  Service: General;  Laterality: N/A;    FAMILY HISTORY: Family History  Problem Relation Age of Onset   Rheum arthritis Father    Hemachromatosis Father    Cancer Sister        bone   as of October 2021 her father isage 46 and her mother age 56.. The patient had one sister (and no brothers). The sister was diagnosed with Ewing's sarcoma at age 45, and died from the disease after many difficult treatments.    GYNECOLOGIC HISTORY:  Last menstrual period occurred about 6 months prior to the start of chemotherapy Menarche: 31 or 55 years  old GX P 0 LMP around 2019 Contraceptive: yes HRT no  Hysterectomy? no BSO? 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 She is widowed. She lives at home with her cat, Mariea Clonts. She is not a Ambulance person.    ADVANCED DIRECTIVES: not in place; at the 08/16/2020 visit the patient was given the appropriate documents to complete and notarized at her discretion.  She tells me she intends to name her significant other fill as her healthcare power of attorney   HEALTH MAINTENANCE: Social History   Tobacco Use   Smoking status: Former Smoker    Packs/day: 0.50    Years: 5.00    Pack years: 2.50    Types: Cigarettes    Start date: 08/06/2006    Quit date: 08/07/2011    Years since quitting: 9.3   Smokeless tobacco: Never Used  Vaping Use   Vaping Use: Never used  Substance Use Topics   Alcohol use: Yes    Comment: occasionally   Drug use: No     Colonoscopy: 2002 for perforated bowel  PAP: 03/2020  Bone density: done at Dr. Keane [facility] office   Allergies  Allergen Reactions   Beef-Derived Products Hives   Latex Rash  Contact   Sulfa Antibiotics Rash    Current Outpatient Medications  Medication Sig Dispense Refill   Rivaroxaban (XARELTO) 15 MG TABS tablet Take 1 tablet (15 mg total) by mouth daily. 90 tablet 3   acetaminophen (TYLENOL) 500 MG tablet Take 1,000 mg by mouth every 6 (six) hours as needed for moderate pain or headache.     amphetamine-dextroamphetamine (ADDERALL XR) 20 MG 24 hr capsule Take 1 capsule (20 mg total) by mouth daily. 30 capsule 0   Ascorbic Acid (VITAMIN C PO) Take 3,000 mg by mouth daily.      CAMILA 0.35 MG tablet Take 1 tablet by mouth daily.  11   Cholecalciferol (VITAMIN D3) 75 MCG (3000 UT) TABS Take 3,000 Units by mouth daily.     cycloSPORINE (RESTASIS) 0.05 % ophthalmic emulsion Place 1 drop into both eyes 2 (two) times daily. 1.5 mL 1   dexamethasone (DECADRON) 4 MG tablet  TAKE 2 TABLETS(8 MG) BY MOUTH TWICE DAILY. START THE DAY BEFORE TAXOTERE. THEN TAKE DAILY FOR 3 DAYS AFTER CHEMOTHERAPY 30 tablet 1   Docusate Sodium (COLACE PO) Take 2 tablets by mouth 2 (two) times daily.     fluconazole (DIFLUCAN) 100 MG tablet TAKE AS DIRECTED 30 tablet 0   Fluocinonide 0.1 % CREA Apply 1 application topically 2 (two) times daily. 240 g 2   hydroxychloroquine (PLAQUENIL) 200 MG tablet Take one tablet by mouth twice daily Monday through Friday. 120 tablet 0   lidocaine-prilocaine (EMLA) cream Apply to affected area once (Patient taking differently: Apply 1 application topically daily as needed (port access).) 30 g 3   loratadine (CLARITIN) 10 MG tablet Take 10 mg by mouth See admin instructions. Take 10 mg daily for 5 days after chemo treatments     LORazepam (ATIVAN) 0.5 MG tablet TAKE 1 TABLET(0.5 MG) BY MOUTH AT BEDTIME AS NEEDED FOR NAUSEA OR VOMITING 30 tablet 0   magic mouthwash SOLN Take 5 mLs by mouth 4 (four) times daily as needed for mouth pain. 240 mL 0   metoCLOPramide (REGLAN) 10 MG tablet Take 1 tablet (10 mg total) by mouth every 6 (six) hours as needed for nausea. 40 tablet 2   metroNIDAZOLE (METROGEL) 1 % gel Apply topically daily. 45 g 0   oxyCODONE (OXY IR/ROXICODONE) 5 MG immediate release tablet Take 1 tablet (5 mg total) by mouth every 6 (six) hours as needed. 6 tablet 0   Pegfilgrastim (NEULASTA [place]) Inject 1 Dose into the skin See admin instructions. Inject 24 to 72 hours after chemo treatment     polyethylene glycol (MIRALAX / GLYCOLAX) 17 g packet Take 17 g by mouth daily.     prochlorperazine (COMPAZINE) 10 MG tablet Take 0.5-1 tablets (5-10 mg total) by mouth 4 (four) times daily -  before meals and at bedtime. Start the evening of chemotherapy and take for 2 days, then take as needed 30 tablet 1   spironolactone (ALDACTONE) 25 MG tablet Take 25 mg by mouth daily as needed (swelling).     traMADol (ULTRAM) 50 MG tablet Take 1-2 tablets  (50-100 mg total) by mouth every 6 (six) hours as needed. (Patient taking differently: Take 50-100 mg by mouth every 6 (six) hours as needed for moderate pain.) 60 tablet 0   tretinoin (RETIN-A) 0.1 % cream Apply 1 application topically at bedtime as needed (acne).      triamcinolone lotion (KENALOG) 0.1 % Apply 1 application topically 3 (three) times daily. 120 mL 2  triamterene-hydrochlorothiazide (DYAZIDE) 37.5-25 MG capsule TAKE 1 CAPSULE BY MOUTH DAILY AS NEEDED FOR SWELLING 90 capsule 0   UNABLE TO FIND Green Vibrance - 1 scoop daily     warfarin (COUMADIN) 5 MG tablet Take 1 tablet (5 mg total) by mouth daily. 30 tablet 11   No current facility-administered medications for this visit.    OBJECTIVE: White woman in no acute distress  Vitals:   12/13/20 1429  BP: 113/67  Pulse: 77  Resp: 18  Temp: 99.3 F (37.4 C)  SpO2: 100%     Body mass index is 19.38 kg/m.   Wt Readings from Last 3 Encounters:  12/13/20 112 lb 14.4 oz (51.2 kg)  11/24/20 113 lb 1.6 oz (51.3 kg)  11/16/20 111 lb 8 oz (50.6 kg)      ECOG FS:2 - Symptomatic, <50% confined to bed  Sclerae unicteric, EOMs intact Wearing a mask No cervical or supraclavicular adenopathy Lungs no rales or rhonchi Heart regular rate and rhythm Abd soft, nontender, positive bowel sounds MSK no focal spinal tenderness, no upper extremity lymphedema Neuro: nonfocal, well oriented, appropriate affect Breasts: The mass in the lower right breast which was before very easy to palpate is now very difficult to palpate.  There may be a slightly less than 1 cm mass in that area at present but her breasts are hard to examine in any case.   LAB RESULTS:  CMP     Component Value Date/Time   NA 138 12/13/2020 1401   NA 137 07/28/2020 1108   K 3.7 12/13/2020 1401   CL 100 12/13/2020 1401   CO2 30 12/13/2020 1401   GLUCOSE 112 (H) 12/13/2020 1401   BUN 9 12/13/2020 1401   BUN 8 07/28/2020 1108   CREATININE 0.78 12/13/2020  1401   CREATININE 0.78 10/20/2020 1330   CALCIUM 8.7 (L) 12/13/2020 1401   PROT 6.2 (L) 12/13/2020 1401   PROT 7.3 07/28/2020 1108   ALBUMIN 3.8 12/13/2020 1401   ALBUMIN 4.9 07/28/2020 1108   AST 17 12/13/2020 1401   AST 19 09/21/2020 0827   ALT 13 12/13/2020 1401   ALT 27 09/21/2020 0827   ALKPHOS 69 12/13/2020 1401   BILITOT 0.3 12/13/2020 1401   BILITOT 0.4 09/21/2020 0827   GFRNONAA >60 12/13/2020 1401   GFRNONAA >60 10/20/2020 1330   GFRAA 95 07/28/2020 1108    No results found for: TOTALPROTELP, ALBUMINELP, A1GS, A2GS, BETS, BETA2SER, GAMS, MSPIKE, SPEI  Lab Results  Component Value Date   WBC 5.2 12/13/2020   NEUTROABS 3.2 12/13/2020   HGB 10.3 (L) 12/13/2020   HCT 32.8 (L) 12/13/2020   MCV 103.1 (H) 12/13/2020   PLT 235 12/13/2020    No results found for: LABCA2  No components found for: [id number]  Recent Labs  Lab 12/13/20 1401  INR 1.4*    No results found for: LABCA2  No results found for: LZJ673  No results found for: ALP379  No results found for: KWI097  No results found for: CA2729  No components found for: HGQUANT  No results found for: CEA1 / No results found for: CEA1   No results found for: AFPTUMOR  No results found for: CHROMOGRNA  No results found for: KPAFRELGTCHN, LAMBDASER, KAPLAMBRATIO (kappa/lambda light chains)  No results found for: HGBA, HGBA2QUANT, HGBFQUANT, HGBSQUAN (Hemoglobinopathy evaluation)   No results found for: LDH  Lab Results  Component Value Date   IRON 88 10/29/2018   IRON 88 10/29/2018   TIBC 241 (L)  10/29/2018   TIBC 241 (L) 10/29/2018   IRONPCTSAT 37 10/29/2018   IRONPCTSAT 37 10/29/2018   (Iron and TIBC)  Lab Results  Component Value Date   FERRITIN 43 10/29/2018   FERRITIN 43 10/29/2018    Urinalysis    Component Value Date/Time   APPEARANCEUR Clear 08/08/2020 1359   GLUCOSEU Negative 08/08/2020 1359   BILIRUBINUR Negative 08/08/2020 1359   PROTEINUR Negative 08/08/2020 1359    NITRITE Negative 08/08/2020 1359   LEUKOCYTESUR Negative 08/08/2020 1359    STUDIES: No results found.   ELIGIBLE FOR AVAILABLE RESEARCH PROTOCOL: AET  ASSESSMENT: 55 y.o. [place] woman status post right breast lower outer quadrant biopsy 08/04/2020 for a clinical T2 N0 M1, stage IV invasive ductal carcinoma, grade 3, triple positive, with an MIB-1 of 40%  (a) breast MRI 08/14/2020 shows 3 additional right breast lesions and heterogeneous enhancement of the sternum  (b) biopsy of the additional right breast mass 09/05/2020 showed atypical lobular hyperplasia (concordant).  (c) CT scan of the abdomen and pelvis 07/14/2020 shows 2 indeterminate liver lesions  (d) abdominal MRI 08/24/2020 shows multiple liver lesions highly suspicious for liver metastases.  (e) liver biopsy 09/20/2020 positive for carcinoma, prognostic panel estrogen and progesterone receptor positive, but HER-2 negative; MIB-1 of 30%  (f) bone scan 09/14/2020 shows no evidence of bone metastases  (g) CT scan of the chest and head 09/14/2020 showed no evidence of metastatic disease in the chest   (1) neoadjuvant chemotherapy to consist of docetaxel, carboplatin, trastuzumab and pertuzumab every 21 days x 6 started 08/31/2020  (a) pertuzumab omitted starting with cycle 4  (b) docetaxel omitted with final cycle, gemzar substituted  (2) trastuzumab to continue indefinitely  (a) echo 04/12/2020 shows an ejection fraction in the 60-65% range  (b) echo 09/25/2020 shows an ejection fraction in the 60 to 65% range.  (3) definitive surgery to follow  (4) adjuvant radiation as appropriate  (5) antiestrogens to start at the completion of local treatment   PLAN: Suanne completes her neoadjuvant chemotherapy tomorrow.  She already has fluid set up for several days afterwards which has been helpful to her in the past.  When she returns in 3 weeks she will receive Herceptin alone and we will continue that until we have the results  of her definitive surgery at which time we will decide whether to switch to a different anti-HER-2 treatment or to continue with Herceptin and add a different agent or not.  Her breast MRI is on 12/26/2020.  She will meet with Dr. Donne Hazel 12/29/2020 to discuss surgical plans.  She will see Dr. Rica Records 12/28/2020 and likely will resume Plaquenil at that point.  She will return to see me 01/03/2021 to start her trastuzumab and to review results of the breast MRI and the surgical plans  We discussed accommodation issues and I hope after a few weeks both the epiphora and accommodation problems will have resolved.  She will be due for an echocardiogram sometime later this month and I have entered those orders  Total encounter time 40 minutes.Sarajane Jews C. Edmar Blankenburg, MD 12/13/2020 3:21 PM Medical Oncology and Hematology [facility] [place], [place] [address] Tel. [phone]    Fax. [phone]   This document serves as a record of services personally performed by Lurline Del, MD. It was created on his behalf by Wilburn Mylar, a trained medical scribe. The creation of this record is based on the scribe's personal observations and the provider's statements  to them.   I, Lurline Del MD, have reviewed the above documentation for accuracy and completeness, and I agree with the above.   *Total Encounter Time as defined by the Centers for Medicare and Medicaid Services includes, in addition to the face-to-face time of a patient visit (documented in the note above) non-face-to-face time: obtaining and reviewing outside history, ordering and reviewing medications, tests or procedures, care coordination (communications with other health care professionals or caregivers) and documentation in the medical record.

## 2020-12-12 NOTE — Progress Notes (Signed)
[facility] Clinical Social Work  [facility] representative faxed patient's application for assistance to [employer]. Remember Erica Wood will contact patient directly if she is selected to receive assistance.    [place], [place] Worker [employer]

## 2020-12-13 ENCOUNTER — Inpatient Hospital Stay: Payer: 59

## 2020-12-13 ENCOUNTER — Inpatient Hospital Stay: Payer: 59 | Attending: Oncology | Admitting: Oncology

## 2020-12-13 ENCOUNTER — Ambulatory Visit: Payer: 59 | Attending: Adult Health

## 2020-12-13 ENCOUNTER — Other Ambulatory Visit: Payer: Self-pay

## 2020-12-13 VITALS — BP 113/67 | HR 77 | Temp 99.3°F | Resp 18 | Ht 64.0 in | Wt 112.9 lb

## 2020-12-13 DIAGNOSIS — Z170 Estrogen receptor positive status [ER+]: Secondary | ICD-10-CM

## 2020-12-13 DIAGNOSIS — M359 Systemic involvement of connective tissue, unspecified: Secondary | ICD-10-CM

## 2020-12-13 DIAGNOSIS — R293 Abnormal posture: Secondary | ICD-10-CM | POA: Insufficient documentation

## 2020-12-13 DIAGNOSIS — L930 Discoid lupus erythematosus: Secondary | ICD-10-CM

## 2020-12-13 DIAGNOSIS — C50511 Malignant neoplasm of lower-outer quadrant of right female breast: Secondary | ICD-10-CM

## 2020-12-13 DIAGNOSIS — Z5112 Encounter for antineoplastic immunotherapy: Secondary | ICD-10-CM | POA: Insufficient documentation

## 2020-12-13 DIAGNOSIS — C787 Secondary malignant neoplasm of liver and intrahepatic bile duct: Secondary | ICD-10-CM | POA: Diagnosis not present

## 2020-12-13 DIAGNOSIS — Z86718 Personal history of other venous thrombosis and embolism: Secondary | ICD-10-CM | POA: Diagnosis not present

## 2020-12-13 DIAGNOSIS — Z86711 Personal history of pulmonary embolism: Secondary | ICD-10-CM

## 2020-12-13 DIAGNOSIS — Z5111 Encounter for antineoplastic chemotherapy: Secondary | ICD-10-CM | POA: Diagnosis present

## 2020-12-13 DIAGNOSIS — M6281 Muscle weakness (generalized): Secondary | ICD-10-CM | POA: Diagnosis present

## 2020-12-13 LAB — PREGNANCY, URINE: Preg Test, Ur: NEGATIVE

## 2020-12-13 LAB — PROTIME-INR
INR: 1.4 — ABNORMAL HIGH (ref 0.8–1.2)
Prothrombin Time: 16.8 seconds — ABNORMAL HIGH (ref 11.4–15.2)

## 2020-12-13 LAB — COMPREHENSIVE METABOLIC PANEL
ALT: 13 U/L (ref 0–44)
AST: 17 U/L (ref 15–41)
Albumin: 3.8 g/dL (ref 3.5–5.0)
Alkaline Phosphatase: 69 U/L (ref 38–126)
Anion gap: 8 (ref 5–15)
BUN: 9 mg/dL (ref 6–20)
CO2: 30 mmol/L (ref 22–32)
Calcium: 8.7 mg/dL — ABNORMAL LOW (ref 8.9–10.3)
Chloride: 100 mmol/L (ref 98–111)
Creatinine, Ser: 0.78 mg/dL (ref 0.44–1.00)
GFR, Estimated: >60 mL/min (ref >60)
Glucose, Bld: 112 mg/dL — ABNORMAL HIGH (ref 70–99)
Potassium: 3.7 mmol/L (ref 3.5–5.1)
Sodium: 138 mmol/L (ref 135–145)
Total Bilirubin: 0.3 mg/dL (ref 0.3–1.2)
Total Protein: 6.2 g/dL — ABNORMAL LOW (ref 6.5–8.1)

## 2020-12-13 LAB — CBC WITH DIFFERENTIAL/PLATELET
Abs Immature Granulocytes: 0.03 10*3/uL (ref 0.00–0.07)
Basophils Absolute: 0.1 10*3/uL (ref 0.0–0.1)
Basophils Relative: 1 %
Eosinophils Absolute: 0 10*3/uL (ref 0.0–0.5)
Eosinophils Relative: 0 %
HCT: 32.8 % — ABNORMAL LOW (ref 36.0–46.0)
Hemoglobin: 10.3 g/dL — ABNORMAL LOW (ref 12.0–15.0)
Immature Granulocytes: 1 %
Lymphocytes Relative: 29 %
Lymphs Abs: 1.5 10*3/uL (ref 0.7–4.0)
MCH: 32.4 pg (ref 26.0–34.0)
MCHC: 31.4 g/dL (ref 30.0–36.0)
MCV: 103.1 fL — ABNORMAL HIGH (ref 80.0–100.0)
Monocytes Absolute: 0.5 10*3/uL (ref 0.1–1.0)
Monocytes Relative: 9 %
Neutro Abs: 3.2 10*3/uL (ref 1.7–7.7)
Neutrophils Relative %: 60 %
Platelets: 235 10*3/uL (ref 150–400)
RBC: 3.18 MIL/uL — ABNORMAL LOW (ref 3.87–5.11)
RDW: 13.9 % (ref 11.5–15.5)
WBC: 5.2 10*3/uL (ref 4.0–10.5)
nRBC: 0 % (ref 0.0–0.2)

## 2020-12-13 NOTE — Patient Instructions (Signed)
Cancer Rehab [phone] HIP: Flexion Standing    Holding onto dining room table with theraband around ankle, lift leg straight in front keeping knee straight. Slow and controlled! _10__ reps per set, _2-3__ sets per day. Then repeat with other leg.   Hip Extension (Standing)    Squeeze pelvic floor and hold so as not to twist hips and don't lean forward. With theraband around ankle and holding dining room table, move right leg backward with straight knee. Slow and controlled! Repeat _10__ times. Do _2-3__ times a day. Repeat with other leg.  Hip Abduction (Standing)    Stand with support at dining room table and theraband around ankle. Squeeze pelvic floor and hold. Lift right leg out to side, keeping toe forward.  Repeat _10__ times. Do _2-3__ times a day. Repeat with other leg.   Wall Squat    Feet shoulder width apart, __10-12__ inches in front of wall, lean against wall. Heels on floor, knees parallel, bend hips and knees to almost 90. Repeat __10-20__ keeping hips against wall when returning to stand. Then try holding __30-60__ seconds. Lower slowly, explode on return. Repeat _3-5___ times. Do _2-3___ sessions per day.  Long [employer]    Straighten operated leg and try to hold it __5__ seconds. Use __2-5__ lbs on ankle. Repeat __10-20__ times. Do __1-2__ sessions a day.  Marching    March in place for _2-3__ minutes. Can also do with ankle weights and slow holds repeating __10-20__ times.  Do _1-2__ times per day.
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
## 2020-12-14 ENCOUNTER — Inpatient Hospital Stay: Payer: 59

## 2020-12-14 ENCOUNTER — Encounter: Payer: Self-pay | Admitting: *Deleted

## 2020-12-14 ENCOUNTER — Encounter: Payer: Self-pay | Admitting: Licensed Clinical Social Worker

## 2020-12-14 ENCOUNTER — Other Ambulatory Visit: Payer: Self-pay

## 2020-12-14 VITALS — BP 115/70 | HR 80 | Temp 98.2°F | Resp 18

## 2020-12-14 DIAGNOSIS — C50511 Malignant neoplasm of lower-outer quadrant of right female breast: Secondary | ICD-10-CM

## 2020-12-14 DIAGNOSIS — Z5112 Encounter for antineoplastic immunotherapy: Secondary | ICD-10-CM | POA: Diagnosis not present

## 2020-12-14 DIAGNOSIS — Z170 Estrogen receptor positive status [ER+]: Secondary | ICD-10-CM

## 2020-12-14 MED ORDER — SODIUM CHLORIDE 0.9 % IV SOLN
10.0000 mg | Freq: Once | INTRAVENOUS | Status: AC
  Administered 2020-12-14: 10 mg via INTRAVENOUS
  Filled 2020-12-14: qty 10

## 2020-12-14 MED ORDER — SODIUM CHLORIDE 0.9% FLUSH
10.0000 mL | INTRAVENOUS | Status: DC | PRN
  Administered 2020-12-14: 10 mL
  Filled 2020-12-14: qty 10

## 2020-12-14 MED ORDER — FOSAPREPITANT DIMEGLUMINE INJECTION 150 MG
150.0000 mg | Freq: Once | INTRAVENOUS | Status: AC
  Administered 2020-12-14: 150 mg via INTRAVENOUS
  Filled 2020-12-14: qty 150

## 2020-12-14 MED ORDER — LORAZEPAM 2 MG/ML IJ SOLN
INTRAMUSCULAR | Status: AC
  Filled 2020-12-14: qty 1

## 2020-12-14 MED ORDER — HEPARIN SOD (PORK) LOCK FLUSH 100 UNIT/ML IV SOLN
500.0000 [IU] | Freq: Once | INTRAVENOUS | Status: AC | PRN
  Administered 2020-12-14: 500 [IU]
  Filled 2020-12-14: qty 5

## 2020-12-14 MED ORDER — SODIUM CHLORIDE 0.9 % IV SOLN
300.0000 mg | Freq: Once | INTRAVENOUS | Status: AC
Start: 2020-12-14 — End: 2020-12-14
  Administered 2020-12-14: 300 mg via INTRAVENOUS
  Filled 2020-12-14: qty 14.29

## 2020-12-14 MED ORDER — ACETAMINOPHEN 325 MG PO TABS
ORAL_TABLET | ORAL | Status: AC
  Filled 2020-12-14: qty 2

## 2020-12-14 MED ORDER — DIPHENHYDRAMINE HCL 25 MG PO CAPS
25.0000 mg | ORAL_CAPSULE | Freq: Once | ORAL | Status: AC
  Administered 2020-12-14: 25 mg via ORAL

## 2020-12-14 MED ORDER — DIPHENHYDRAMINE HCL 25 MG PO CAPS
ORAL_CAPSULE | ORAL | Status: AC
  Filled 2020-12-14: qty 1

## 2020-12-14 MED ORDER — SODIUM CHLORIDE 0.9 % IV SOLN
Freq: Once | INTRAVENOUS | Status: AC
  Filled 2020-12-14: qty 250

## 2020-12-14 MED ORDER — CARBOPLATIN CHEMO INJECTION 600 MG/60ML
449.5000 mg | Freq: Once | INTRAVENOUS | Status: AC
Start: 2020-12-14 — End: 2020-12-14
  Administered 2020-12-14: 450 mg via INTRAVENOUS
  Filled 2020-12-14: qty 45

## 2020-12-14 MED ORDER — LORAZEPAM 2 MG/ML IJ SOLN
0.5000 mg | Freq: Once | INTRAMUSCULAR | Status: AC
  Administered 2020-12-14: 0.5 mg via INTRAVENOUS

## 2020-12-14 MED ORDER — ACETAMINOPHEN 325 MG PO TABS
650.0000 mg | ORAL_TABLET | Freq: Once | ORAL | Status: AC
  Administered 2020-12-14: 650 mg via ORAL

## 2020-12-14 MED ORDER — SODIUM CHLORIDE 0.9 % IV SOLN
16.0000 mg | Freq: Once | INTRAVENOUS | Status: AC
  Administered 2020-12-14: 16 mg via INTRAVENOUS
  Filled 2020-12-14: qty 8

## 2020-12-14 MED ORDER — SODIUM CHLORIDE 0.9 % IV SOLN
800.0000 mg/m2 | Freq: Once | INTRAVENOUS | Status: AC
  Administered 2020-12-14: 1216 mg via INTRAVENOUS
  Filled 2020-12-14: qty 31.98

## 2020-12-14 NOTE — Progress Notes (Signed)
[facility] CSW Progress Note  Holiday representative met with patient in infusion. Patient is still dealing with watering eyes which has been frustrating. She has also been busy working on [employer] disability application paperwork. No needs at this time. Patient will continue to work on other applications for assistance as she is able.    Christeen Douglas , LCSW

## 2020-12-14 NOTE — Patient Instructions (Signed)
[facility] Discharge Instructions for Patients Receiving Chemotherapy  Today you received the following chemotherapy agents trastuzumab, gemcitabine, carboplatin.   To help prevent nausea and vomiting after your treatment, we encourage you to take your nausea medication as directed.  If you develop nausea and vomiting that is not controlled by your nausea medication, call the clinic.   BELOW ARE SYMPTOMS THAT SHOULD BE REPORTED IMMEDIATELY:  *FEVER GREATER THAN 100.5 F  *CHILLS WITH OR WITHOUT FEVER  NAUSEA AND VOMITING THAT IS NOT CONTROLLED WITH YOUR NAUSEA MEDICATION  *UNUSUAL SHORTNESS OF BREATH  *UNUSUAL BRUISING OR BLEEDING  TENDERNESS IN MOUTH AND THROAT WITH OR WITHOUT PRESENCE OF ULCERS  *URINARY PROBLEMS  *BOWEL PROBLEMS  UNUSUAL RASH Items with * indicate a potential emergency and should be followed up as soon as possible.  Feel free to call the clinic should you have any questions or concerns. The clinic phone number is [phone].  Please show the CHEMO ALERT CARD at check-in to the Emergency Department and triage nurse.   

## 2020-12-15 ENCOUNTER — Encounter: Payer: Self-pay | Admitting: Hematology and Oncology

## 2020-12-15 ENCOUNTER — Telehealth: Payer: Self-pay | Admitting: Oncology

## 2020-12-15 NOTE — Progress Notes (Signed)
Office Visit Note  Patient: Erica Wood             Date of Birth: 1966/06/14           MRN: [id number]             PCP: Eunice Blase, MD Referring: Eunice Blase, MD Visit Date: 12/28/2020 Occupation: @GUAROCC @  Subjective:  Pain in both hands   History of Present Illness: Erica Wood is a 55 y.o. female with history of autoimmune disease and osteoarthritis.  Patient reports that since her last office visit she was diagnosed with breast cancer and has been following along closely with Dr. Jana Hakim.  She was started on chemotherapy which she completed on 12/26/2020.  Port-A-Cath remains in place.  Patient reports that she has been holding Plaquenil while she was undergoing chemotherapy.  She is currently having increased pain and stiffness in both hands and both feet.  She continues to have chronic pain and stiffness in both hands.  She states that she underwent Euflexxa injections in both knees performed by Dr. Junius Roads.  She has been experiencing generalized muscle weakness over the past several months while undergoing chemo.  She has started to try to walk for exercise. She has also been having frequent symptoms of Raynaud's but denies any digital ulcerations.  She states that she had some oral ulcerations while on chemotherapy and use Magic mouthwash as needed.  She continues to have chronic sicca symptoms.  She states that she was evaluated by her ophthalmologist yesterday and was found to have several tear ducts that were blocked.  She is being referred to an ophthalmic plastic surgeon to try to open the ducts.  She is no longer using Restasis.   Activities of Daily Living:  Patient reports morning stiffness for several hours  Patient Denies nocturnal pain.  Difficulty dressing/grooming: Denies Difficulty climbing stairs: Denies Difficulty getting out of chair: Denies Difficulty using hands for taps, buttons, cutlery, and/or writing: Reports  Review of Systems   Constitutional: Positive for fatigue.  HENT: Negative for mouth dryness.   Eyes: Negative for dryness.  Respiratory: Negative for shortness of breath.   Cardiovascular: Positive for swelling in legs/feet.  Gastrointestinal: Positive for constipation.  Endocrine: Positive for cold intolerance.  Genitourinary: Negative for difficulty urinating.  Musculoskeletal: Positive for arthralgias, joint pain, joint swelling, muscle weakness and muscle tenderness.  Skin: Negative for rash.  Allergic/Immunologic: Negative for susceptible to infections.  Neurological: Positive for weakness.  Hematological: Negative for bruising/bleeding tendency.  Psychiatric/Behavioral: Positive for sleep disturbance.    PMFS History:  Patient Active Problem List   Diagnosis Date Noted  . Port-A-Cath in place 11/02/2020  . Aortic atherosclerosis ([place]) 09/21/2020  . Malignant neoplasm of lower-outer quadrant of right breast of female, estrogen receptor positive ([place]) 08/10/2020  . Abnormal cervical Papanicolaou smear 07/12/2020  . Pulmonary embolism ([place]) 07/12/2020  . Fibrocystic disease of breast 01/22/2018  . Primary osteoarthritis of both knees 05/02/2017  . Raynaud's disease without gangrene 11/26/2016  . 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 Anticardiolipin antibody positive 11/26/2016  . Autoimmune disease ([place]) 11/25/2016  . High risk medication use 11/25/2016  . Primary osteoarthritis of both hands 11/25/2016  . History of DVT (deep vein thrombosis) 11/25/2016  . History of pulmonary embolism 11/25/2016  . Primary insomnia 11/25/2016  . Anxiety 11/25/2016  . Irritable bowel syndrome with constipation 11/25/2016  . Raynaud's phenomenon 01/03/2016  . Varicose veins of lower extremities with other complications [id number]/[id number]  . Spider  veins of both lower extremities 05/24/2014    Past Medical History:  Diagnosis Date  . Anti-cardiolipin antibody positive   . Anti-cardiolipin antibody syndrome (HCC)   .  Anxiety   . Arthralgia   . Arthritis    bil knees, hands  . Atypical chest pain   . Autoimmune disease ([place])   . Cancer ([place]) 07/2020   right breast IDC  . Chronic fatigue   . Depression   . DVT (deep venous thrombosis) ([place])   . IBS (irritable bowel syndrome)    with constipation  . Lupus ([place])   . Pulmonary embolus ([place])   . Raynaud's syndrome   . Sicca ([place])   . Thrombocytopenia ([place])     Family History  Problem Relation Age of Onset  . Rheum arthritis Father   . Hemachromatosis Father   . Cancer Sister        bone    Past Surgical History:  Procedure Laterality Date  . ABLATION  12/2018   leg veins  . ABLATION Right 08/2019   venous leg   . APPENDECTOMY    . COLON SURGERY     Perforated colon repair after colostomy  . GANGLION CYST EXCISION    . HYSTEROSCOPY WITH D & C N/A 01/13/2014   Procedure: DILATATION AND CURETTAGE /HYSTEROSCOPY with resection ;  Surgeon: Olga Millers, MD;  Location: [facility];  Service: Gynecology;  Laterality: N/A;  . LAPAROTOMY    . PORTACATH PLACEMENT N/A 09/13/2020   Procedure: INSERTION PORT-A-CATH WITH ULTRASOUND GUIDANCE;  Surgeon: Rolm Bookbinder, MD;  Location: [facility];  Service: General;  Laterality: N/A;   Social History   Social History Narrative  . Not on file   Immunization History  Administered Date(s) Administered  . PFIZER Comirnaty([place])Covid-19 Tri-Sucrose Vaccine 08/11/2020     Objective: Vital Signs: BP (!) 97/59 (BP Location: Left Arm, Patient Position: Sitting, Cuff Size: Normal)   Pulse 82   Resp 15   Ht 5\' 4"  (1.626 m)   BMI 19.38 kg/m    Physical Exam Vitals and nursing note reviewed.  Constitutional:      Appearance: She is well-developed and well-nourished.  HENT:     Head: Normocephalic and atraumatic.  Eyes:     Extraocular Movements: EOM normal.     Conjunctiva/sclera: Conjunctivae normal.  Cardiovascular:     Pulses: Intact distal pulses.     Comments: Port-a-cath  in place Pulmonary:     Effort: Pulmonary effort is normal.  Abdominal:     Palpations: Abdomen is soft.  Musculoskeletal:     Cervical back: Normal range of motion.  Skin:    General: Skin is warm and dry.     Capillary Refill: Capillary refill takes less than 2 seconds.     Comments: Discoloration and ridging of fingernails noted.  No digital ulcerations or signs of gangrene noted.    Neurological:     Mental Status: She is alert and oriented to person, place, and time.  Psychiatric:        Mood and Affect: Mood and affect normal.        Behavior: Behavior normal.      Musculoskeletal Exam: C-spine, thoracic spine, lumbar spine have good range of motion.  Shoulder joints, elbow joints, wrist joints have good range of motion with no synovitis.  She has tenderness on the dorsal aspect of the left wrist.  Tenderness and synovitis over bilateral second and third MCP joints noted.  She  is able to make a complete fist bilaterally.  DIP thickening consistent with osteoarthritic changes noted.  Hip joints have good range of motion.  She has tenderness location of her bilateral trochanteric bursa.  Knee joints have good range of motion with no warmth or effusion.  Ankle joints have good range of motion with no tenderness or inflammation.  Mild pedal edema noted bilaterally.  She has tenderness of patient over the right second MTP joint but no synovitis was noted.   CDAI Exam: CDAI Score: -- Patient Global: --; Provider Global: -- Swollen: 4 ; Tender: 5  Joint Exam 12/28/2020      Right  Left  MCP 2  Swollen Tender  Swollen Tender  MCP 3  Swollen Tender  Swollen Tender  MTP 2   Tender        Investigation: No additional findings.  Imaging: MR BREAST BILATERAL W WO CONTRAST INC CAD  Result Date: 12/27/2020 CLINICAL DATA:  55 year old female for re-evaluation after neoadjuvant chemotherapy. LABS:  None performed on site. EXAM: BILATERAL BREAST MRI WITH AND WITHOUT CONTRAST TECHNIQUE:  Multiplanar, multisequence MR images of both breasts were obtained prior to and following the intravenous administration of 6 ml of Gadavist. Three-dimensional MR images were rendered by post-processing of the original MR data on an independent workstation. The three-dimensional MR images were interpreted, and findings are reported in the following complete MRI report for this study. Three dimensional images were evaluated at the independent interpreting workstation using the DynaCAD thin client. COMPARISON:  Previous exam(s). FINDINGS: Breast composition: d. Extreme fibroglandular tissue. Background parenchymal enhancement: Moderate. Right breast: Post biopsy changes again demonstrated in the inferior right breast at the site of the patient's index lesion. There has been marked interval decrease in size, with a lesion now measuring 6 x 5 x 8 mm (previously 2.6 x 2.5 x 2.2 cm). Additionally, there is been interval decrease in size of a mass in the upper outer right breast at anterior depth now measuring approximately 3 mm (previously 7 mm). This is the site of the patient's additional MRI guided biopsy demonstrating sclerotic lesion with focal lobular neoplasia [facility]). Two additional masses in the medial left breast are approximately stable in size. The slightly more inferior and posterior mass measures 5-6 mm (previously 5 mm), and the additional upper outer mass measures 1.3 x 0.7 cm (previously 1.2 x 0.7 cm). No additional suspicious findings in the remainder of the right breast. Left breast: No mass or abnormal enhancement. Lymph nodes: No abnormal appearing lymph nodes. Ancillary findings: None. Previously described heterogenous enhancement of the sternum less conspicuous on today's study. IMPRESSION: 1. Interval decrease in size of inferior right breast index lesion now measuring 8 mm (previously 2.6 cm) consistent with good response to chemotherapy. 2. An additional site of biopsy proven focal lobular  neoplasia in the upper outer right breast also demonstrates interval decrease in size, currently measuring 3 mm (previously 7 mm). 3. Stable appearance of 2 additional masses in the medial left breast. These have not been previously biopsied. 4. No suspicious findings in the remainder of the right breast or within the left breast. RECOMMENDATION: Per clinical treatment plan. BI-RADS CATEGORY  6: Known biopsy-proven malignancy. Electronically Signed   By: Kristopher Oppenheim M.D.   On: 12/27/2020 14:37    Recent Labs: Lab Results  Component Value Date   WBC 5.2 12/13/2020   HGB 10.3 (L) 12/13/2020   PLT 235 12/13/2020   NA 138 12/13/2020   K 3.7  12/13/2020   CL 100 12/13/2020   CO2 30 12/13/2020   GLUCOSE 112 (H) 12/13/2020   BUN 9 12/13/2020   CREATININE 0.78 12/13/2020   BILITOT 0.3 12/13/2020   ALKPHOS 69 12/13/2020   AST 17 12/13/2020   ALT 13 12/13/2020   PROT 6.2 (L) 12/13/2020   ALBUMIN 3.8 12/13/2020   CALCIUM 8.7 (L) 12/13/2020   GFRAA 95 07/28/2020    Speciality Comments: PLQ Eye Exam: 02/18/2020 WNL @ [facility] follow up in 1 year. stage IB invasive ductal carcinoma, grade 3 08/16/20  Procedures:  No procedures performed Allergies: Beef-derived products, Latex, and Sulfa antibiotics   Assessment / Plan:     Visit Diagnoses: Autoimmune disease ([place]) - Positive ANA and positive double-stranded DNA, hypocomplementemia, inflammatory arthritis, raynauds phenomenon: Since her last office visit she was diagnosed with malignant breast cancer and has been undergoing chemotherapy ordered by Dr. Jana Hakim.  She has experienced multiple side effects from chemotherapy which has made it difficult to determine if her autoimmune disease has been active recently.  She has been experiencing significant fatigue, Raynaud's, oral ulcers, intermittent rashes, hair loss, joint pain, and joint swelling.  She has been holding Plaquenil while receiving chemotherapy.  Her last dose of  chemotherapy was administered on 12/26/2020.  She has an appointment with the surgical oncologist tomorrow and a follow-up appointment with Dr. Jana Hakim next week to discuss the plans going forward.  She is due to update autoimmune lab work.  Future orders were placed today to be drawn with her lab work next week.  She will restart Plaquenil 200 mg 1 tablet by mouth twice daily Monday through Friday.  Refill was sent to the pharmacy on 12/26/20. She will follow-up in the office in 5 months.  Discoid lupus: No recurrence of discoid lesions since her last visit.  High risk medication use - She has been holding Plaquenil while undergoing chemotherapy.  Her last dose of chemo was on 12/26/2020.  She has an appointment with the surgical oncologist tomorrow and an upcoming appointment with Dr. Jana Hakim next week to discuss the next steps.   She will be restarting plaquenil 200 mg 1 tablet by mouth twice daily M-F.   Malignant neoplasm of lower-outer quadrant of right breast of female, estrogen receptor positive [facility]): Since her last office visit she was diagnosed with triple positive stage IV breast cancer and has been following along closely with Dr. Jana Hakim.  She was started on neoadjuvant chemotherapy on 08/31/20, and she received the last dose on 12/26/2020.  She will be continuing herceptin indefinitely.  Patient reports that she has been holding Plaquenil while she was undergoing chemotherapy.  She will be following up with the surgical oncologist tomorrow and has an appointment with Dr. Jana Hakim next week to discuss the next steps.  The plan is to restart her on PLQ BID M-F.    Port-A-Cath in place  Raynaud's disease without gangrene: She continues to experience frequent symptoms of Raynaud's in her hands and feet.  She has good capillary refill less than 2 seconds.  No skin thickening or tightness was noted.  No digital ulcerations or signs of gangrene were noted.  We discussed the importance of wearing  gloves and socks as well as keeping her core body temperature warm.  She was advised to notify us if she develops any new or worsening symptoms.  Anticardiolipin antibody positive - She had low titer antibody titers.  She is on long-term warfarin due to history of DVT and  PE.  Primary osteoarthritis of both hands: She has been experiencing increased pain and stiffness in both hands over the past several weeks.  She has tenderness and synovitis over bilateral second and third MCP joints.  She is able to make a complete fist bilaterally.  Joint protection and muscle strengthening were discussed.  Primary osteoarthritis of both knees: She underwent Euflexxa Visco gel injections in both knees performed by Dr. Junius Roads.  She has started to have some increased discomfort and stiffness in both knee joints recently.  She has good range of motion of both knees on examination today with no warmth or effusion.  Other medical conditions are listed as follows:  Primary insomnia: She has been experiencing difficulty sleeping at night.  Vitamin D deficiency  History of anxiety  History of pulmonary embolism: She is taking warfarin as prescribed.   History of DVT (deep vein thrombosis): She is on warfarin.    History of IBS  Family history of hemochromatosis  Orders: No orders of the defined types were placed in this encounter.  No orders of the defined types were placed in this encounter.    Follow-Up Instructions: Return in about 5 months (around 05/27/2021) for Autoimmune Disease.   Ofilia Neas, PA-C  Note - This record has been created using Dragon software.  Chart creation errors have been sought, but may not always  have been located. Such creation errors do not reflect on  the standard of medical care.

## 2020-12-15 NOTE — Telephone Encounter (Signed)
Scheduled appts per 2/2 los. Left voicemail with appt date and time.  °

## 2020-12-18 ENCOUNTER — Other Ambulatory Visit: Payer: Self-pay

## 2020-12-18 ENCOUNTER — Inpatient Hospital Stay: Payer: 59

## 2020-12-18 DIAGNOSIS — Z95828 Presence of other vascular implants and grafts: Secondary | ICD-10-CM

## 2020-12-18 DIAGNOSIS — Z5112 Encounter for antineoplastic immunotherapy: Secondary | ICD-10-CM | POA: Diagnosis not present

## 2020-12-18 MED ORDER — SODIUM CHLORIDE 0.9 % IV SOLN
INTRAVENOUS | Status: DC
  Filled 2020-12-18 (×2): qty 250

## 2020-12-20 ENCOUNTER — Inpatient Hospital Stay: Payer: 59

## 2020-12-20 ENCOUNTER — Other Ambulatory Visit: Payer: Self-pay

## 2020-12-20 VITALS — BP 114/64 | HR 70 | Temp 98.4°F | Resp 18

## 2020-12-20 DIAGNOSIS — Z95828 Presence of other vascular implants and grafts: Secondary | ICD-10-CM

## 2020-12-20 DIAGNOSIS — Z5112 Encounter for antineoplastic immunotherapy: Secondary | ICD-10-CM | POA: Diagnosis not present

## 2020-12-20 MED ORDER — HEPARIN SOD (PORK) LOCK FLUSH 100 UNIT/ML IV SOLN
500.0000 [IU] | Freq: Once | INTRAVENOUS | Status: AC
  Administered 2020-12-20: 500 [IU]
  Filled 2020-12-20: qty 5

## 2020-12-20 MED ORDER — SODIUM CHLORIDE 0.9 % IV SOLN
INTRAVENOUS | Status: DC
  Filled 2020-12-20 (×2): qty 250

## 2020-12-20 MED ORDER — SODIUM CHLORIDE 0.9% FLUSH
10.0000 mL | Freq: Once | INTRAVENOUS | Status: AC
  Administered 2020-12-20: 10 mL
  Filled 2020-12-20: qty 10

## 2020-12-20 NOTE — Patient Instructions (Signed)
Rehydration, Adult Rehydration is the replacement of body fluids, salts, and minerals (electrolytes) that are lost during dehydration. Dehydration is when there is not enough water or other fluids in the body. This happens when you lose more fluids than you take in. Common causes of dehydration include:  Not drinking enough fluids. This can occur when you are ill or doing activities that require a lot of energy, especially in hot weather.  Conditions that cause loss of water or other fluids, such as diarrhea, vomiting, sweating, or urinating a lot.  Other illnesses, such as fever or infection.  Certain medicines, such as those that remove excess fluid from the body (diuretics). Symptoms of mild or moderate dehydration may include thirst, dry lips and mouth, and dizziness. Symptoms of severe dehydration may include increased heart rate, confusion, fainting, and not urinating. For severe dehydration, you may need to get fluids through an IV at the hospital. For mild or moderate dehydration, you can usually rehydrate at home by drinking certain fluids as told by your health care provider. What are the risks? Generally, rehydration is safe. However, taking in too much fluid (overhydration) can be a problem. This is rare. Overhydration can cause an electrolyte imbalance, kidney failure, or a decrease in salt (sodium) levels in the body. Supplies needed You will need an oral rehydration solution (ORS) if your health care provider tells you to use one. This is a drink to treat dehydration. It can be found in pharmacies and retail stores. How to rehydrate Fluids Follow instructions from your health care provider for rehydration. The kind of fluid and the amount you should drink depend on your condition. In general, you should choose drinks that you prefer.  If told by your health care provider, drink an ORS. ? Make an ORS by following instructions on the package. ? Start by drinking small amounts,  about  cup (120 mL) every 5-10 minutes. ? Slowly increase how much you drink until you have taken the amount recommended by your health care provider.  Drink enough clear fluids to keep your urine pale yellow. If you were told to drink an ORS, finish it first, then start slowly drinking other clear fluids. Drink fluids such as: ? Water. This includes sparkling water and flavored water. Drinking only water can lead to having too little sodium in your body (hyponatremia). Follow the advice of your health care provider. ? Water from ice chips you suck on. ? Fruit juice with water you add to it (diluted). ? Sports drinks. ? Hot or cold herbal teas. ? Broth-based soups. ? Milk or milk products. Food Follow instructions from your health care provider about what to eat while you rehydrate. Your health care provider may recommend that you slowly begin eating regular foods in small amounts.  Eat foods that contain a healthy balance of electrolytes, such as bananas, oranges, potatoes, tomatoes, and spinach.  Avoid foods that are greasy or contain a lot of sugar. In some cases, you may get nutrition through a feeding tube that is passed through your nose and into your stomach (nasogastric tube, or NG tube). This may be done if you have uncontrolled vomiting or diarrhea.   Beverages to avoid Certain beverages may make dehydration worse. While you rehydrate, avoid drinking alcohol.   How to tell if you are recovering from dehydration You may be recovering from dehydration if:  You are urinating more often than before you started rehydrating.  Your urine is pale yellow.  Your energy level   improves.  You vomit less frequently.  You have diarrhea less frequently.  Your appetite improves or returns to normal.  You feel less dizzy or less light-headed.  Your skin tone and color start to look more normal. Follow these instructions at home:  Take over-the-counter and prescription medicines only  as told by your health care provider.  Do not take sodium tablets. Doing this can lead to having too much sodium in your body (hypernatremia). Contact a health care provider if:  You continue to have symptoms of mild or moderate dehydration, such as: ? Thirst. ? Dry lips. ? Slightly dry mouth. ? Dizziness. ? Dark urine or less urine than normal. ? Muscle cramps.  You continue to vomit or have diarrhea. Get help right away if you:  Have symptoms of dehydration that get worse.  Have a fever.  Have a severe headache.  Have been vomiting and the following happens: ? Your vomiting gets worse or does not go away. ? Your vomit includes blood or green matter (bile). ? You cannot eat or drink without vomiting.  Have problems with urination or bowel movements, such as: ? Diarrhea that gets worse or does not go away. ? Blood in your stool (feces). This may cause stool to look black and tarry. ? Not urinating, or urinating only a small amount of very dark urine, within 6-8 hours.  Have trouble breathing.  Have symptoms that get worse with treatment. These symptoms may represent a serious problem that is an emergency. Do not wait to see if the symptoms will go away. Get medical help right away. Call your local emergency services (911 in the [place] Do not drive yourself to the hospital. Summary  Rehydration is the replacement of body fluids and minerals (electrolytes) that are lost during dehydration.  Follow instructions from your health care provider for rehydration. The kind of fluid and amount you should drink depend on your condition.  Slowly increase how much you drink until you have taken the amount recommended by your health care provider.  Contact your health care provider if you continue to show signs of mild or moderate dehydration. This information is not intended to replace advice given to you by your health care provider. Make sure you discuss any questions you have with  your health care provider. Document Revised: 12/29/2019 Document Reviewed: 11/08/2019 Elsevier Patient Education  2021 [employer]vier Inc.  

## 2020-12-22 ENCOUNTER — Inpatient Hospital Stay: Payer: 59

## 2020-12-22 ENCOUNTER — Other Ambulatory Visit: Payer: Self-pay

## 2020-12-22 VITALS — BP 105/60 | HR 81 | Temp 98.5°F | Resp 18

## 2020-12-22 DIAGNOSIS — Z5112 Encounter for antineoplastic immunotherapy: Secondary | ICD-10-CM | POA: Diagnosis not present

## 2020-12-22 DIAGNOSIS — Z95828 Presence of other vascular implants and grafts: Secondary | ICD-10-CM

## 2020-12-22 MED ORDER — SODIUM CHLORIDE 0.9 % IV SOLN
INTRAVENOUS | Status: DC
  Filled 2020-12-22 (×2): qty 250

## 2020-12-22 MED ORDER — SODIUM CHLORIDE 0.9% FLUSH
10.0000 mL | Freq: Once | INTRAVENOUS | Status: DC
  Filled 2020-12-22: qty 10

## 2020-12-22 MED ORDER — HEPARIN SOD (PORK) LOCK FLUSH 100 UNIT/ML IV SOLN
500.0000 [IU] | Freq: Once | INTRAVENOUS | Status: AC
  Administered 2020-12-22: 500 [IU]
  Filled 2020-12-22: qty 5

## 2020-12-24 ENCOUNTER — Other Ambulatory Visit: Payer: Self-pay | Admitting: Oncology

## 2020-12-26 ENCOUNTER — Other Ambulatory Visit: Payer: Self-pay | Admitting: Physician Assistant

## 2020-12-26 ENCOUNTER — Other Ambulatory Visit: Payer: Self-pay

## 2020-12-26 ENCOUNTER — Ambulatory Visit
Admission: RE | Admit: 2020-12-26 | Discharge: 2020-12-26 | Disposition: A | Discharge status: Home / Self Care | Payer: 59 | Source: Ambulatory Visit | Attending: Oncology | Admitting: Oncology

## 2020-12-26 ENCOUNTER — Inpatient Hospital Stay: Payer: 59

## 2020-12-26 ENCOUNTER — Telehealth: Payer: Self-pay

## 2020-12-26 VITALS — BP 116/69 | HR 79 | Temp 98.4°F

## 2020-12-26 DIAGNOSIS — Z95828 Presence of other vascular implants and grafts: Secondary | ICD-10-CM

## 2020-12-26 DIAGNOSIS — Z170 Estrogen receptor positive status [ER+]: Secondary | ICD-10-CM

## 2020-12-26 DIAGNOSIS — Z5112 Encounter for antineoplastic immunotherapy: Secondary | ICD-10-CM | POA: Diagnosis not present

## 2020-12-26 DIAGNOSIS — C50511 Malignant neoplasm of lower-outer quadrant of right female breast: Secondary | ICD-10-CM

## 2020-12-26 IMAGING — MR MR BREAST BILAT WO/W CM
7 of 12 series · 28 of 48 positions shown · IV contrast (gadavist)
Comparison: Previous exam(s).
COMPARISON: Previous exam(s).

Addendum:
CLINICAL DATA: 54-year-old female for re-evaluation after
neoadjuvant chemotherapy.

LABS:  None performed on site.
EXAM:
BILATERAL BREAST MRI WITH AND WITHOUT CONTRAST
TECHNIQUE: Multiplanar, multisequence MR images of both breasts were obtained
prior to and following the intravenous administration of 6 ml of
Gadavist.

[Series 2: t2_tirm_tra ipat (a-p) · axial · 3.0mm · 0.64mm/px · 1 of 55 slices shown]
[im 1/55]
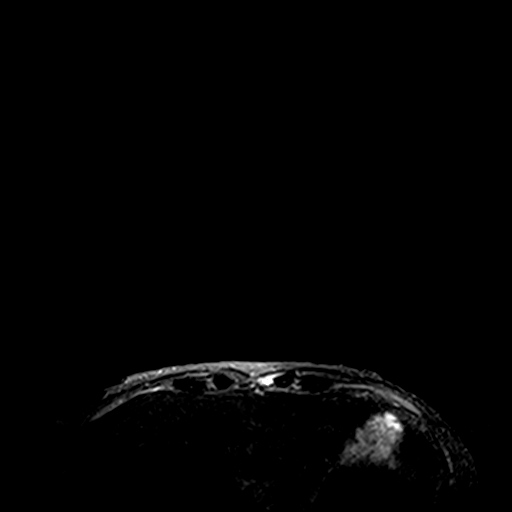

[Series 3: fl3d pre-cm no · axial · non-contrast · 1.2mm · 0.86mm/px · z∈[-59,+113]mm · 5 of 144 slices shown]
[im 1/144]
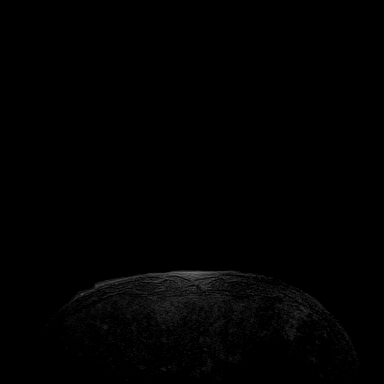
[im 36/144]
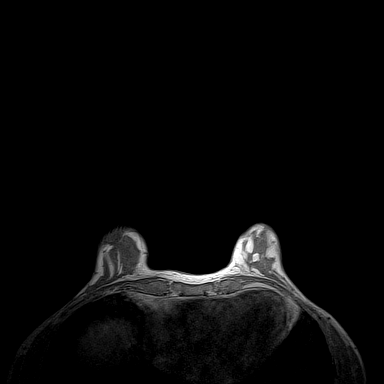
[im 72/144]
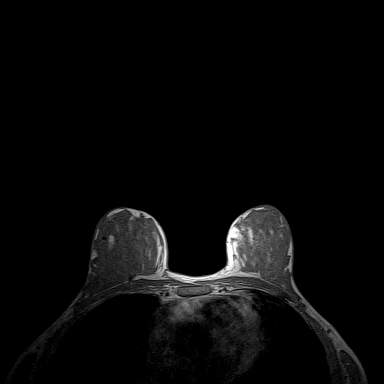
[im 108/144]
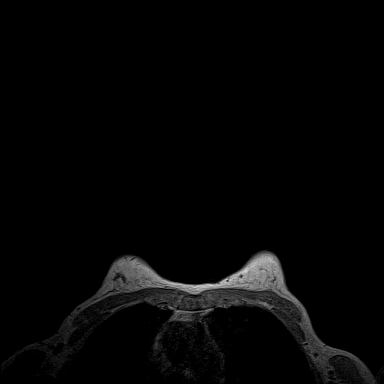
[im 144/144]
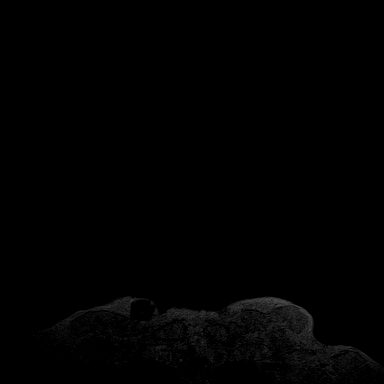

[Series 4: fl3d pre-cm · axial · non-contrast · 1.2mm · 0.86mm/px · z∈[-59,+113]mm · 5 of 144 slices shown]
[im 1/144]
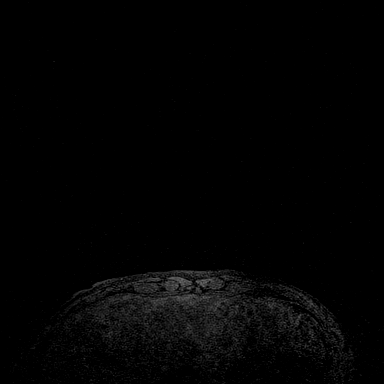
[im 36/144]
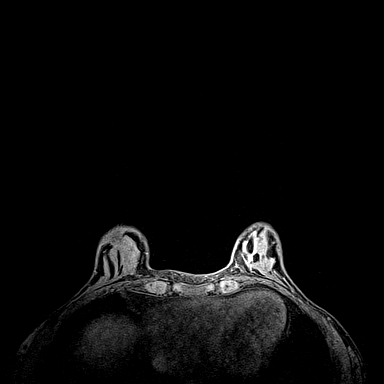
[im 72/144]
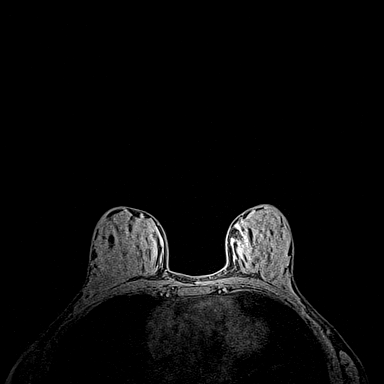
[im 108/144]
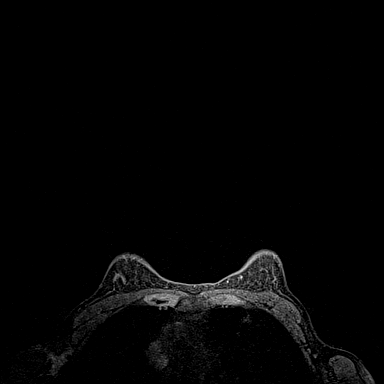
[im 144/144]
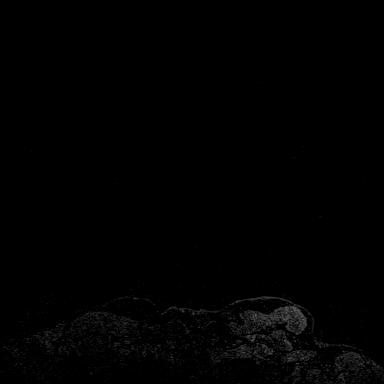

[Series 5: fl3d post-cm 20 · axial · 1.2mm · 0.86mm/px · z∈[-59,+113]mm · 5 of 144 slices shown (1 of 3)]
[im 1/144]
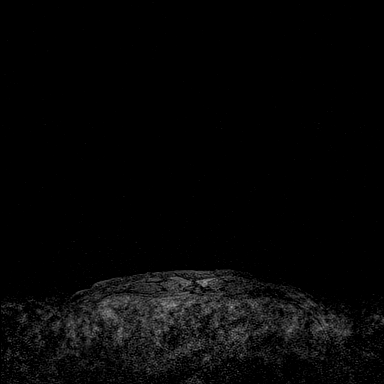
[im 36/144]
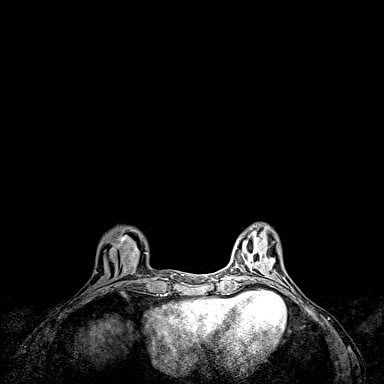
[im 72/144]
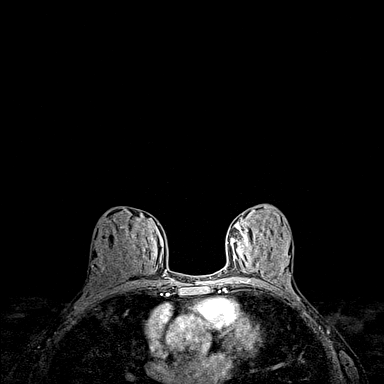
[im 108/144]
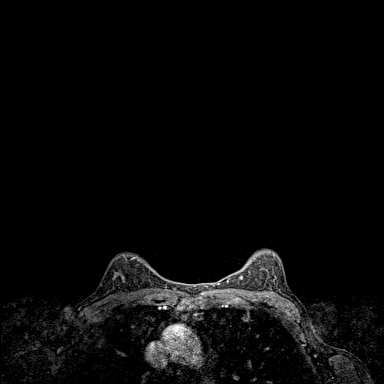
[im 144/144]
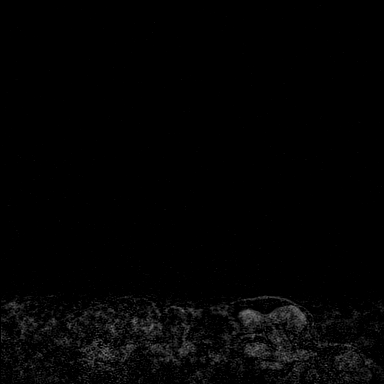

[Series 6: fl3d post-cm 20 · axial · 1.2mm · 0.86mm/px · z∈[-59,+113]mm · 5 of 144 slices shown (2 of 3)]
[im 1/144]
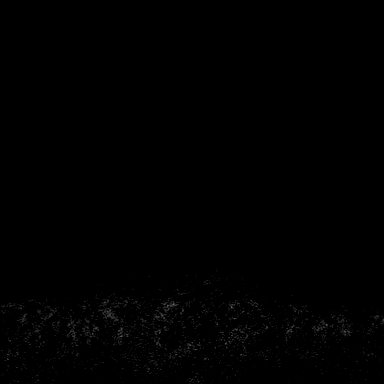
[im 36/144]
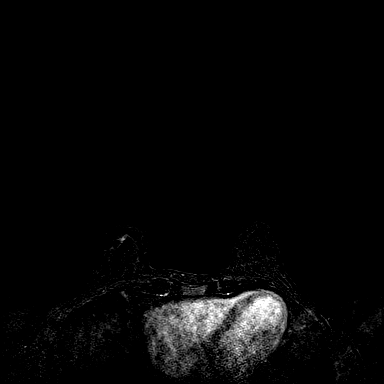
[im 72/144]
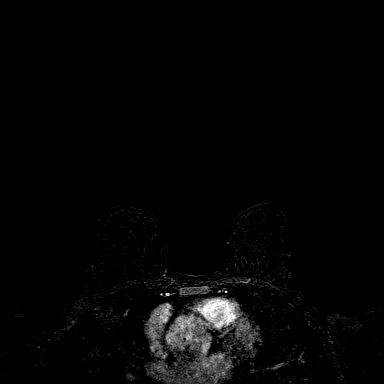
[im 108/144]
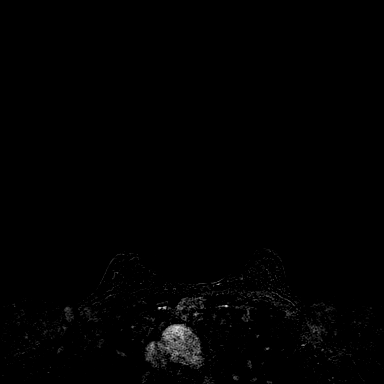
[im 144/144]
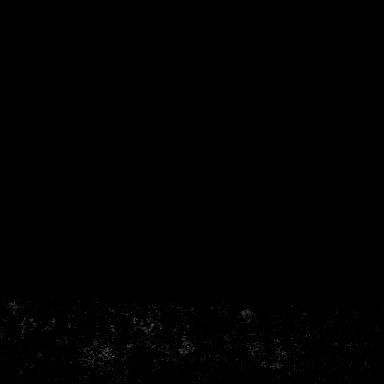

[Series 7: fl3d post-cm 20 · axial · 172.8mm · 0.86mm/px · 1 of 1 slices shown (3 of 3)]
[im 1/1]
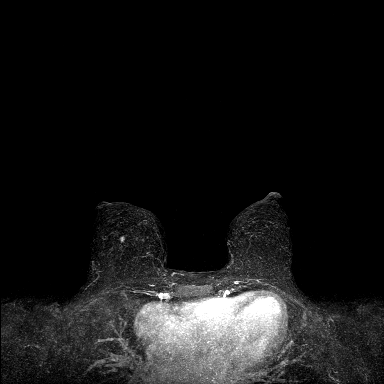

[Series 8: fl3d post-cm 3min · axial · 1.2mm · 0.86mm/px · z∈[-59,+113]mm · 6 of 144 slices shown]
[im 1/144]
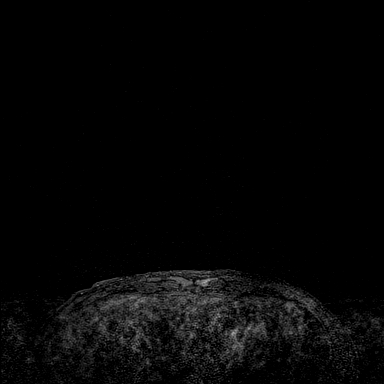
[im 29/144]
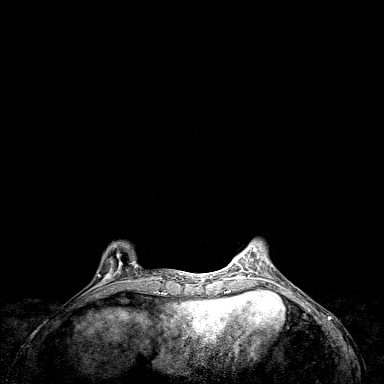
[im 58/144]
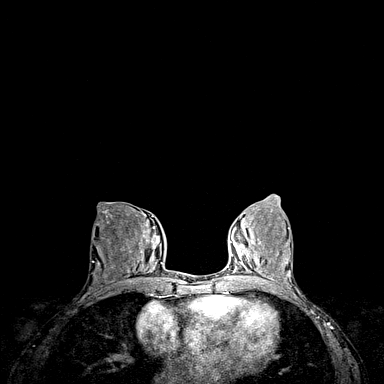
[im 86/144]
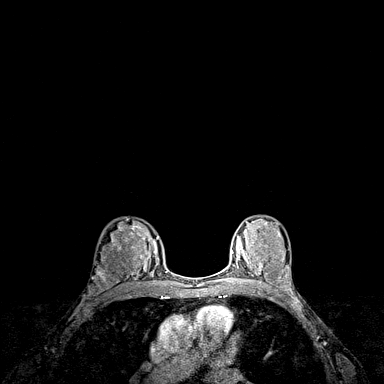
[im 115/144]
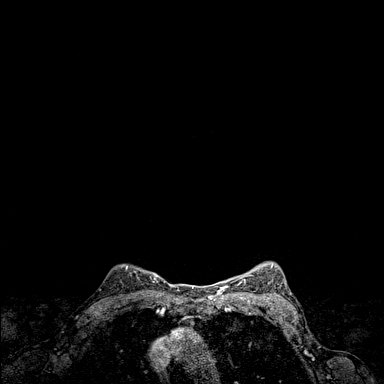
[im 144/144]
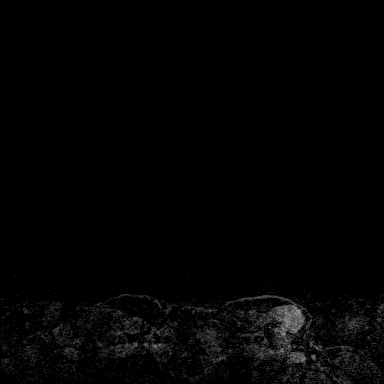

[28 of 48 positions shown; findings below may reference images not displayed]

Three-dimensional MR images were rendered by post-processing of the
original MR data on an independent workstation. The
three-dimensional MR images were interpreted, and findings are
reported in the following complete MRI report for this study. Three
dimensional images were evaluated at the independent interpreting
workstation using the DynaCAD thin client.
FINDINGS: Breast composition: d. Extreme fibroglandular tissue.

Background parenchymal enhancement: Moderate.

Right breast: Post biopsy changes again demonstrated in the inferior
right breast at the site of the patient's index lesion. There has
been marked interval decrease in size, with a lesion now measuring 6
x 5 x 8 mm (previously 2.6 x 2.5 x 2.2 cm).

Additionally, there is been interval decrease in size of a mass in
the upper outer right breast at anterior depth now measuring
approximately 3 mm (previously 7 mm). This is the site of the
patient's additional MRI guided biopsy demonstrating sclerotic
lesion with focal lobular neoplasia ([facility]).

Two additional masses in the medial left breast are approximately
stable in size. The slightly more inferior and posterior mass
measures 5-6 mm (previously 5 mm), and the additional upper outer
mass measures 1.3 x 0.7 cm (previously 1.2 x 0.7 cm).

No additional suspicious findings in the remainder of the right
breast.

Left breast: No mass or abnormal enhancement.

Lymph nodes: No abnormal appearing lymph nodes.

Ancillary findings: None. Previously described heterogenous
enhancement of the sternum less conspicuous on today's study.
IMPRESSION: 1. Interval decrease in size of inferior right breast index lesion
now measuring 8 mm (previously 2.6 cm) consistent with good response
to chemotherapy.
2. An additional site of biopsy proven focal lobular neoplasia in
the upper outer right breast also demonstrates interval decrease in
size, currently measuring 3 mm (previously 7 mm).
3. Stable appearance of 2 additional masses in the medial left
breast. These have not been previously biopsied.
4. No suspicious findings in the remainder of the right breast or
within the left breast.

RECOMMENDATION:
Per clinical treatment plan.

BI-RADS CATEGORY  6: Known biopsy-proven malignancy.

ADDENDUM:
This is a correction to the findings and impression section of the
previously dictated report. The remainder of the FINDINGS,
IMPRESSION and RECOMMENDATION are unchanged.
FINDINGS: Two additional masses in the medial RIGHT breast are approximately
stable in size as detailed in the report.
IMPRESSION: 3. Stable appearance of 2 additional masses in the medial RIGHT
breast. These have not been previously biopsied.

*** End of Addendum ***
Three-dimensional MR images were rendered by post-processing of the
original MR data on an independent workstation. The
three-dimensional MR images were interpreted, and findings are
reported in the following complete MRI report for this study. Three
dimensional images were evaluated at the independent interpreting
workstation using the DynaCAD thin client.
FINDINGS: Breast composition: d. Extreme fibroglandular tissue.

Background parenchymal enhancement: Moderate.

Right breast: Post biopsy changes again demonstrated in the inferior
right breast at the site of the patient's index lesion. There has
been marked interval decrease in size, with a lesion now measuring 6
x 5 x 8 mm (previously 2.6 x 2.5 x 2.2 cm).

Additionally, there is been interval decrease in size of a mass in
the upper outer right breast at anterior depth now measuring
approximately 3 mm (previously 7 mm). This is the site of the
patient's additional MRI guided biopsy demonstrating sclerotic
lesion with focal lobular neoplasia ([facility]).

Two additional masses in the medial left breast are approximately
stable in size. The slightly more inferior and posterior mass
measures 5-6 mm (previously 5 mm), and the additional upper outer
mass measures 1.3 x 0.7 cm (previously 1.2 x 0.7 cm).

No additional suspicious findings in the remainder of the right
breast.

Left breast: No mass or abnormal enhancement.

Lymph nodes: No abnormal appearing lymph nodes.

Ancillary findings: None. Previously described heterogenous
enhancement of the sternum less conspicuous on today's study.
IMPRESSION: 1. Interval decrease in size of inferior right breast index lesion
now measuring 8 mm (previously 2.6 cm) consistent with good response
to chemotherapy.
2. An additional site of biopsy proven focal lobular neoplasia in
the upper outer right breast also demonstrates interval decrease in
size, currently measuring 3 mm (previously 7 mm).
3. Stable appearance of 2 additional masses in the medial left
breast. These have not been previously biopsied.
4. No suspicious findings in the remainder of the right breast or
within the left breast.

RECOMMENDATION:
Per clinical treatment plan.

BI-RADS CATEGORY  6: Known biopsy-proven malignancy.

## 2020-12-26 MED ORDER — GADOBUTROL 1 MMOL/ML IV SOLN
6.0000 mL | Freq: Once | INTRAVENOUS | Status: AC | PRN
  Administered 2020-12-26: 6 mL via INTRAVENOUS

## 2020-12-26 MED ORDER — SODIUM CHLORIDE 0.9 % IV SOLN
INTRAVENOUS | Status: DC
  Filled 2020-12-26 (×2): qty 250

## 2020-12-26 MED ORDER — HEPARIN SOD (PORK) LOCK FLUSH 100 UNIT/ML IV SOLN
500.0000 [IU] | Freq: Once | INTRAVENOUS | Status: AC
Start: 2020-12-26 — End: 2020-12-26
  Administered 2020-12-26: 500 [IU]
  Filled 2020-12-26: qty 5

## 2020-12-26 MED ORDER — SODIUM CHLORIDE 0.9% FLUSH
10.0000 mL | Freq: Once | INTRAVENOUS | Status: AC
  Administered 2020-12-26: 10 mL
  Filled 2020-12-26: qty 10

## 2020-12-26 NOTE — Telephone Encounter (Signed)
Patient left a voicemail stating she just received a call from [facility] that the PA has denied her Hydroxychlorquine.  Patient states her oncologist told her that she could go back on the medication and that she talked to Dr. Estanislado Pandy.  Patient states she is scheduled for an appointment on Thursday, 12/28/20 with Lovena Le and is not sure if she should reschedule to see Dr. Estanislado Pandy so she can discuss the medication.  Please advise.

## 2020-12-26 NOTE — Telephone Encounter (Signed)
Last Visit: 07/27/2020 Next Visit: 12/28/2020 Labs: 12/13/2020, RBC 3.18, Hemoglobin 10.3, HCT 32.8, MCV 103.1, Glucose 112, Calcium 8.7, Total Protein 6.2, INR 1.4, Prothrombin Time 16.8,  Eye exam: 02/18/2020  Current Dose per office note 07/27/2020, PLQ 200 mg BID M-F [id number] disease   Last Fill: 10/04/2020  Okay to refill Plaquenil?

## 2020-12-26 NOTE — Telephone Encounter (Signed)
Left message to advise patient her prescription was not denied. Patient advised her prescription was sent to the pharmacy at 11:52 am this morning.

## 2020-12-26 NOTE — Patient Instructions (Signed)
Rehydration, Adult Rehydration is the replacement of body fluids, salts, and minerals (electrolytes) that are lost during dehydration. Dehydration is when there is not enough water or other fluids in the body. This happens when you lose more fluids than you take in. Common causes of dehydration include:  Not drinking enough fluids. This can occur when you are ill or doing activities that require a lot of energy, especially in hot weather.  Conditions that cause loss of water or other fluids, such as diarrhea, vomiting, sweating, or urinating a lot.  Other illnesses, such as fever or infection.  Certain medicines, such as those that remove excess fluid from the body (diuretics). Symptoms of mild or moderate dehydration may include thirst, dry lips and mouth, and dizziness. Symptoms of severe dehydration may include increased heart rate, confusion, fainting, and not urinating. For severe dehydration, you may need to get fluids through an IV at the hospital. For mild or moderate dehydration, you can usually rehydrate at home by drinking certain fluids as told by your health care provider. What are the risks? Generally, rehydration is safe. However, taking in too much fluid (overhydration) can be a problem. This is rare. Overhydration can cause an electrolyte imbalance, kidney failure, or a decrease in salt (sodium) levels in the body. Supplies needed You will need an oral rehydration solution (ORS) if your health care provider tells you to use one. This is a drink to treat dehydration. It can be found in pharmacies and retail stores. How to rehydrate Fluids Follow instructions from your health care provider for rehydration. The kind of fluid and the amount you should drink depend on your condition. In general, you should choose drinks that you prefer.  If told by your health care provider, drink an ORS. ? Make an ORS by following instructions on the package. ? Start by drinking small amounts,  about  cup (120 mL) every 5-10 minutes. ? Slowly increase how much you drink until you have taken the amount recommended by your health care provider.  Drink enough clear fluids to keep your urine pale yellow. If you were told to drink an ORS, finish it first, then start slowly drinking other clear fluids. Drink fluids such as: ? Water. This includes sparkling water and flavored water. Drinking only water can lead to having too little sodium in your body (hyponatremia). Follow the advice of your health care provider. ? Water from ice chips you suck on. ? Fruit juice with water you add to it (diluted). ? Sports drinks. ? Hot or cold herbal teas. ? Broth-based soups. ? Milk or milk products. Food Follow instructions from your health care provider about what to eat while you rehydrate. Your health care provider may recommend that you slowly begin eating regular foods in small amounts.  Eat foods that contain a healthy balance of electrolytes, such as bananas, oranges, potatoes, tomatoes, and spinach.  Avoid foods that are greasy or contain a lot of sugar. In some cases, you may get nutrition through a feeding tube that is passed through your nose and into your stomach (nasogastric tube, or NG tube). This may be done if you have uncontrolled vomiting or diarrhea.   Beverages to avoid Certain beverages may make dehydration worse. While you rehydrate, avoid drinking alcohol.   How to tell if you are recovering from dehydration You may be recovering from dehydration if:  You are urinating more often than before you started rehydrating.  Your urine is pale yellow.  Your energy level   improves.  You vomit less frequently.  You have diarrhea less frequently.  Your appetite improves or returns to normal.  You feel less dizzy or less light-headed.  Your skin tone and color start to look more normal. Follow these instructions at home:  Take over-the-counter and prescription medicines only  as told by your health care provider.  Do not take sodium tablets. Doing this can lead to having too much sodium in your body (hypernatremia). Contact a health care provider if:  You continue to have symptoms of mild or moderate dehydration, such as: ? Thirst. ? Dry lips. ? Slightly dry mouth. ? Dizziness. ? Dark urine or less urine than normal. ? Muscle cramps.  You continue to vomit or have diarrhea. Get help right away if you:  Have symptoms of dehydration that get worse.  Have a fever.  Have a severe headache.  Have been vomiting and the following happens: ? Your vomiting gets worse or does not go away. ? Your vomit includes blood or green matter (bile). ? You cannot eat or drink without vomiting.  Have problems with urination or bowel movements, such as: ? Diarrhea that gets worse or does not go away. ? Blood in your stool (feces). This may cause stool to look black and tarry. ? Not urinating, or urinating only a small amount of very dark urine, within 6-8 hours.  Have trouble breathing.  Have symptoms that get worse with treatment. These symptoms may represent a serious problem that is an emergency. Do not wait to see if the symptoms will go away. Get medical help right away. Call your local emergency services (911 in the [place] Do not drive yourself to the hospital. Summary  Rehydration is the replacement of body fluids and minerals (electrolytes) that are lost during dehydration.  Follow instructions from your health care provider for rehydration. The kind of fluid and amount you should drink depend on your condition.  Slowly increase how much you drink until you have taken the amount recommended by your health care provider.  Contact your health care provider if you continue to show signs of mild or moderate dehydration. This information is not intended to replace advice given to you by your health care provider. Make sure you discuss any questions you have with  your health care provider. Document Revised: 12/29/2019 Document Reviewed: 11/08/2019 Elsevier Patient Education  2021 [employer]vier Inc.  

## 2020-12-27 ENCOUNTER — Other Ambulatory Visit: Payer: Self-pay | Admitting: Oncology

## 2020-12-28 ENCOUNTER — Ambulatory Visit: Payer: 59

## 2020-12-28 ENCOUNTER — Other Ambulatory Visit: Payer: Self-pay

## 2020-12-28 ENCOUNTER — Encounter: Payer: Self-pay | Admitting: Physician Assistant

## 2020-12-28 ENCOUNTER — Ambulatory Visit (INDEPENDENT_AMBULATORY_CARE_PROVIDER_SITE_OTHER): Payer: 59 | Admitting: Physician Assistant

## 2020-12-28 VITALS — BP 97/59 | HR 82 | Resp 15 | Ht 64.0 in

## 2020-12-28 DIAGNOSIS — M19042 Primary osteoarthritis, left hand: Secondary | ICD-10-CM

## 2020-12-28 DIAGNOSIS — M19041 Primary osteoarthritis, right hand: Secondary | ICD-10-CM

## 2020-12-28 DIAGNOSIS — M359 Systemic involvement of connective tissue, unspecified: Secondary | ICD-10-CM

## 2020-12-28 DIAGNOSIS — Z79899 Other long term (current) drug therapy: Secondary | ICD-10-CM | POA: Diagnosis not present

## 2020-12-28 DIAGNOSIS — M170 Bilateral primary osteoarthritis of knee: Secondary | ICD-10-CM

## 2020-12-28 DIAGNOSIS — L930 Discoid lupus erythematosus: Secondary | ICD-10-CM | POA: Diagnosis not present

## 2020-12-28 DIAGNOSIS — Z8659 Personal history of other mental and behavioral disorders: Secondary | ICD-10-CM

## 2020-12-28 DIAGNOSIS — Z95828 Presence of other vascular implants and grafts: Secondary | ICD-10-CM

## 2020-12-28 DIAGNOSIS — R760 Raised antibody titer: Secondary | ICD-10-CM

## 2020-12-28 DIAGNOSIS — Z86711 Personal history of pulmonary embolism: Secondary | ICD-10-CM

## 2020-12-28 DIAGNOSIS — Z8349 Family history of other endocrine, nutritional and metabolic diseases: Secondary | ICD-10-CM

## 2020-12-28 DIAGNOSIS — I7300 Raynaud's syndrome without gangrene: Secondary | ICD-10-CM

## 2020-12-28 DIAGNOSIS — Z8719 Personal history of other diseases of the digestive system: Secondary | ICD-10-CM

## 2020-12-28 DIAGNOSIS — Z170 Estrogen receptor positive status [ER+]: Secondary | ICD-10-CM

## 2020-12-28 DIAGNOSIS — E559 Vitamin D deficiency, unspecified: Secondary | ICD-10-CM

## 2020-12-28 DIAGNOSIS — Z86718 Personal history of other venous thrombosis and embolism: Secondary | ICD-10-CM

## 2020-12-28 DIAGNOSIS — C50511 Malignant neoplasm of lower-outer quadrant of right female breast: Secondary | ICD-10-CM | POA: Diagnosis not present

## 2020-12-28 DIAGNOSIS — F5101 Primary insomnia: Secondary | ICD-10-CM

## 2020-12-29 ENCOUNTER — Encounter: Payer: Self-pay | Admitting: *Deleted

## 2020-12-29 ENCOUNTER — Ambulatory Visit: Payer: 59

## 2021-01-02 NOTE — Progress Notes (Signed)
[place]  Telephone:(336) [phone] [phone]) [phone]     ID: Erica Wood DOB: Dec 13, 1965  MR#: [id number]  SJG#:[id number]  Patient Care Team: Erica Blase, MD as PCP - General (Family Medicine) Erica Wood, Erica Hill Bing, MD as Referring Physician (Oncology) Erica Germany, RN as Oncology Nurse Navigator Erica Kaufmann, RN as Oncology Nurse Navigator Erica Bookbinder, MD as Consulting Physician (General Surgery) Magrinat, Virgie Dad, MD as Consulting Physician (Oncology) Erica Rudd, MD as Consulting Physician (Radiation Oncology) Erica Merino, MD as Consulting Physician (Rheumatology) Erica Millers, MD as Consulting Physician (Obstetrics and Gynecology) Erica Dresser, MD as Consulting Physician (Cardiology) Chauncey Cruel, MD OTHER MD:  CHIEF COMPLAINT: triple positive stage IV breast cancer  CURRENT TREATMENT: herceptin; anastrozole, palbociclib   INTERVAL HISTORY: Erica Wood returns today for follow up and treatment of her triple positive breast cancer.   She completed neoadjuvant chemotherapy on 12/14/2020.  Her most recent echocardiogram from 09/25/2020 showed an ejection fraction of 60-65%.  Her next echo is due before her next Herceptin treatment.  Since her last visit, she underwent breast MRI on 12/26/2020 showing: breast composition D; interval decrease in size of inferior right breast index lesion, now 8 mm (previously 2.6 cm); interval decrease in size of additional site of biopsy-proven focal lobular neoplasia in upper-outer right breast, no 3 mm (previously 7 mm); stable appearance of 2 additional masses in medial right breast, which have not been previously biopsied.   REVIEW OF SYSTEMS: Erica Wood still has significant tearing.  She says she is using Restasis but I do not find it in her med list.  I have reordered that.  She is having low back pain which is new, has been present about a week, and radiates slightly down both legs to the knee.   She does not have any sensory loss or any motor weakness associated with that.  Her constipation is "really bad".  She says she is "terrified of menopause".  Of course she is already in menopause as she has not had a period in many months.  A detailed review of systems today was otherwise stable   COVID 19 VACCINATION STATUS: Status post Pfizer x1   HISTORY OF CURRENT ILLNESS: From the original intake note:  Erica Wood herself palpated an abnormality in the right breast several years ago.  This had not changed until sometime in June 2021.  She was evaluated by Dr. Ouida Sills and underwent right diagnostic mammography with tomography and right breast ultrasonography at [facility] on 07/26/2020 showing: breast density category D; 2.6 cm mass in right breast at 6 o'clock, at site of palpable concern; two focal groups of punctate calcifications posterior to palpable mass; no right axillary adenopathy.  Accordingly on 08/04/2020 she proceeded to biopsy of the right breast area in question. The pathology from this procedure ([id number]) showed: invasive ductal carcinoma, grade 3. Prognostic indicators significant for: estrogen receptor, 95% positive with moderate staining intensity and progesterone receptor, 10% positive with strong staining intensity. Proliferation marker Ki67 at 40%. HER2 equivocal by immunohistochemistry (2+), but positive by fluorescent in situ hybridization with a signals ratio 3.23 and number per cell 6.45.  Biopsy of the posterior calcifications was benign.  She also underwent breast MRI on 08/11/2020 showing: breast composition D; 2.6 cm biopsy-proven malignancy in right breast at 6 o'clock, with probable skin involvement noted on previous ultrasound; additional enhancing masses within right breast-- 5 mm in lower-outer, 1.2 cm in upper-inner, 7 mm in upper-outer; no evidence of  left breast malignancy or lymphadenopathy; heterogeneous enhancement of sternum, of uncertain  significance.  The patient's subsequent history is as detailed below.   PAST MEDICAL HISTORY: Past Medical History:  Diagnosis Date  . Anti-cardiolipin antibody positive   . Anti-cardiolipin antibody syndrome (HCC)   . Anxiety   . Arthralgia   . Arthritis    bil knees, hands  . Atypical chest pain   . Autoimmune disease ([place])   . Cancer ([place]) 07/2020   right breast IDC  . Chronic fatigue   . Depression   . DVT (deep venous thrombosis) ([place])   . IBS (irritable bowel syndrome)    with constipation  . Lupus ([place])   . Pulmonary embolus ([place])   . Raynaud's syndrome   . Sicca ([place])   . Thrombocytopenia ([place])     PAST SURGICAL HISTORY: Past Surgical History:  Procedure Laterality Date  . ABLATION  12/2018   leg veins  . ABLATION Right 08/2019   venous leg   . APPENDECTOMY    . COLON SURGERY     Perforated colon repair after colostomy  . GANGLION CYST EXCISION    . HYSTEROSCOPY WITH D & C N/A 01/13/2014   Procedure: DILATATION AND CURETTAGE /HYSTEROSCOPY with resection ;  Surgeon: Erica Millers, MD;  Location: [facility];  Service: Gynecology;  Laterality: N/A;  . LAPAROTOMY    . PORTACATH PLACEMENT N/A 09/13/2020   Procedure: INSERTION PORT-A-CATH WITH ULTRASOUND GUIDANCE;  Surgeon: Erica Bookbinder, MD;  Location: [facility];  Service: General;  Laterality: N/A;    FAMILY HISTORY: Family History  Problem Relation Age of Onset  . Rheum arthritis Father   . Hemachromatosis Father   . Cancer Sister        bone   as of October 2021 her father isage 67 and her mother age 46.. The patient had one sister (and no brothers). The sister was diagnosed with Ewing's sarcoma at age 9, and died from the disease after many difficult treatments.    GYNECOLOGIC HISTORY:  Last menstrual period occurred about 6 months prior to the start of chemotherapy Menarche: 69 or 55 years old GX P 0 LMP around 2019 Contraceptive: yes HRT no  Hysterectomy? no BSO?  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 She is widowed. She lives at home with her cat, Erica Wood. She is not a Ambulance person.    ADVANCED DIRECTIVES: not in place; at the 08/16/2020 visit the patient was given the appropriate documents to complete and notarized at her discretion.  She tells me she intends to name her significant other fill as her healthcare power of attorney   HEALTH MAINTENANCE: Social History   Tobacco Use  . Smoking status: Former Smoker    Packs/day: 0.50    Years: 5.00    Pack years: 2.50    Types: Cigarettes    Start date: 08/06/2006    Quit date: 08/07/2011    Years since quitting: 9.4  . Smokeless tobacco: Never Used  Vaping Use  . Vaping Use: Never used  Substance Use Topics  . Alcohol use: Yes    Comment: occasionally  . Drug use: No     Colonoscopy: 2002 for perforated bowel  PAP: 03/2020  Bone density: done at Dr. [facility] office   Allergies  Allergen Reactions  . Beef-Derived Products Hives  . Latex Rash    Contact  . Sulfa Antibiotics Rash    Current  Outpatient Medications  Medication Sig Dispense Refill  . anastrozole (ARIMIDEX) 1 MG tablet Take 1 tablet (1 mg total) by mouth daily. 90 tablet 4  . palbociclib (IBRANCE) 125 MG capsule Take 1 capsule (125 mg total) by mouth daily with breakfast. Take whole with food. Take for 21 days on, 7 days off, repeat every 28 days. 21 capsule 6  . acetaminophen (TYLENOL) 500 MG tablet Take 1,000 mg by mouth every 6 (six) hours as needed for moderate pain or headache.    . amphetamine-dextroamphetamine (ADDERALL XR) 20 MG 24 hr capsule Take 1 capsule (20 mg total) by mouth daily. 30 capsule 0  . Ascorbic Acid (VITAMIN C PO) Take 3,000 mg by mouth daily.     Marland Kitchen CAMILA 0.35 MG tablet Take 1 tablet by mouth daily.  11  . Cholecalciferol (VITAMIN D3) 75 MCG (3000 UT) TABS Take 3,000 Units by mouth daily.    Marland Kitchen dexamethasone (DECADRON) 4 MG tablet TAKE 2  TABLETS(8 MG) BY MOUTH TWICE DAILY. START THE DAY BEFORE TAXOTERE. THEN TAKE DAILY FOR 3 DAYS AFTER CHEMOTHERAPY (Patient not taking: Reported on 12/28/2020) 30 tablet 1  . Docusate Sodium (COLACE PO) Take 2 tablets by mouth 2 (two) times daily.    . fluconazole (DIFLUCAN) 100 MG tablet TAKE AS DIRECTED 30 tablet 0  . Fluocinonide 0.1 % CREA Apply 1 application topically 2 (two) times daily. 240 g 2  . hydroxychloroquine (PLAQUENIL) 200 MG tablet TAKE 1 TABLET BY MOUTH TWICE DAILY MONDAY THROUGH FRIDAY 120 tablet 0  . lidocaine-prilocaine (EMLA) cream Apply to affected area once (Patient taking differently: Apply 1 application topically daily as needed (port access).) 30 g 3  . loratadine (CLARITIN) 10 MG tablet Take 10 mg by mouth See admin instructions. Take 10 mg daily for 5 days after chemo treatments    . LORazepam (ATIVAN) 0.5 MG tablet TAKE 1 TABLET(0.5 MG) BY MOUTH AT BEDTIME AS NEEDED FOR NAUSEA OR VOMITING 30 tablet 0  . magic mouthwash SOLN Take 5 mLs by mouth 4 (four) times daily as needed for mouth pain. 240 mL 0  . metoCLOPramide (REGLAN) 10 MG tablet Take 1 tablet (10 mg total) by mouth every 6 (six) hours as needed for nausea. 40 tablet 2  . metroNIDAZOLE (METROGEL) 1 % gel Apply topically daily. 45 g 0  . oxyCODONE (OXY IR/ROXICODONE) 5 MG immediate release tablet Take 1 tablet (5 mg total) by mouth every 6 (six) hours as needed. (Patient not taking: Reported on 12/28/2020) 6 tablet 0  . Pegfilgrastim (NEULASTA [place]) Inject 1 Dose into the skin See admin instructions. Inject 24 to 72 hours after chemo treatment    . polyethylene glycol (MIRALAX / GLYCOLAX) 17 g packet Take 17 g by mouth daily.    . prochlorperazine (COMPAZINE) 10 MG tablet Take 0.5-1 tablets (5-10 mg total) by mouth 4 (four) times daily -  before meals and at bedtime. Start the evening of chemotherapy and take for 2 days, then take as needed (Patient not taking: Reported on 12/28/2020) 30 tablet 1  . spironolactone  (ALDACTONE) 25 MG tablet Take 25 mg by mouth daily as needed (swelling).    . traMADol (ULTRAM) 50 MG tablet Take 1-2 tablets (50-100 mg total) by mouth every 6 (six) hours as needed. (Patient taking differently: Take 50-100 mg by mouth every 6 (six) hours as needed for moderate pain.) 60 tablet 0  . tretinoin (RETIN-A) 0.1 % cream Apply 1 application topically at bedtime as needed (acne).     Marland Kitchen  triamcinolone lotion (KENALOG) 0.1 % Apply 1 application topically 3 (three) times daily. 120 mL 2  . triamterene-hydrochlorothiazide (DYAZIDE) 37.5-25 MG capsule TAKE 1 CAPSULE BY MOUTH DAILY AS NEEDED FOR SWELLING 90 capsule 0  . UNABLE TO FIND Green Vibrance - 1 scoop daily    . warfarin (COUMADIN) 5 MG tablet Take 1 tablet (5 mg total) by mouth daily. 30 tablet 11   No current facility-administered medications for this visit.    OBJECTIVE: White woman who appears stated age  There were no vitals filed for this visit.   There is no height or weight on file to calculate BMI.   Wt Readings from Last 3 Encounters:  12/13/20 112 lb 14.4 oz (51.2 kg)  11/24/20 113 lb 1.6 oz (51.3 kg)  11/16/20 111 lb 8 oz (50.6 kg)      ECOG FS:2 - Symptomatic, <50% confined to bed  Sclerae unicteric, EOMs intact Wearing a mask No cervical or supraclavicular adenopathy Lungs no rales or rhonchi Heart regular rate and rhythm Abd soft, nontender, positive bowel sounds MSK no focal spinal tenderness, no upper extremity lymphedema Neuro: nonfocal, well oriented, appropriate affect Breasts: I do not palpate a mass in the right breast.  The left breast is unremarkable.  Both axillae are benign.   LAB RESULTS:  CMP     Component Value Date/Time   NA 138 12/13/2020 1401   NA 137 07/28/2020 1108   K 3.7 12/13/2020 1401   CL 100 12/13/2020 1401   CO2 30 12/13/2020 1401   GLUCOSE 112 (H) 12/13/2020 1401   BUN 9 12/13/2020 1401   BUN 8 07/28/2020 1108   CREATININE 0.78 12/13/2020 1401   CREATININE 0.78  10/20/2020 1330   CALCIUM 8.7 (L) 12/13/2020 1401   PROT 6.2 (L) 12/13/2020 1401   PROT 7.3 07/28/2020 1108   ALBUMIN 3.8 12/13/2020 1401   ALBUMIN 4.9 07/28/2020 1108   AST 17 12/13/2020 1401   AST 19 09/21/2020 0827   ALT 13 12/13/2020 1401   ALT 27 09/21/2020 0827   ALKPHOS 69 12/13/2020 1401   BILITOT 0.3 12/13/2020 1401   BILITOT 0.4 09/21/2020 0827   GFRNONAA >60 12/13/2020 1401   GFRNONAA >60 10/20/2020 1330   GFRAA 95 07/28/2020 1108    No results found for: TOTALPROTELP, ALBUMINELP, A1GS, A2GS, BETS, BETA2SER, GAMS, MSPIKE, SPEI  Lab Results  Component Value Date   WBC 5.2 12/13/2020   NEUTROABS 3.2 12/13/2020   HGB 10.3 (L) 12/13/2020   HCT 32.8 (L) 12/13/2020   MCV 103.1 (H) 12/13/2020   PLT 235 12/13/2020    No results found for: LABCA2  No components found for: QQVZDG387  No results for input(s): INR in the last 168 hours.  No results found for: LABCA2  No results found for: FIE332  No results found for: RJJ884  No results found for: ZYS063  No results found for: CA2729  No components found for: HGQUANT  No results found for: CEA1 / No results found for: CEA1   No results found for: AFPTUMOR  No results found for: CHROMOGRNA  No results found for: KPAFRELGTCHN, LAMBDASER, KAPLAMBRATIO (kappa/lambda light chains)  No results found for: HGBA, HGBA2QUANT, HGBFQUANT, HGBSQUAN (Hemoglobinopathy evaluation)   No results found for: LDH  Lab Results  Component Value Date   IRON 88 10/29/2018   IRON 88 10/29/2018   TIBC 241 (L) 10/29/2018   TIBC 241 (L) 10/29/2018   IRONPCTSAT 37 10/29/2018   IRONPCTSAT 37 10/29/2018   (Iron  and TIBC)  Lab Results  Component Value Date   FERRITIN 43 10/29/2018   FERRITIN 43 10/29/2018    Urinalysis    Component Value Date/Time   APPEARANCEUR Clear 08/08/2020 1359   GLUCOSEU Negative 08/08/2020 1359   BILIRUBINUR Negative 08/08/2020 1359   PROTEINUR Negative 08/08/2020 1359   NITRITE Negative  08/08/2020 1359   LEUKOCYTESUR Negative 08/08/2020 1359    STUDIES: MR BREAST BILATERAL W WO CONTRAST INC CAD  Addendum Date: 12/29/2020   ADDENDUM REPORT: 12/29/2020 14:52 ADDENDUM: This is a correction to the findings and impression section of the previously dictated report. The remainder of the FINDINGS, IMPRESSION and RECOMMENDATION are unchanged. FINDINGS: Two additional masses in the medial RIGHT breast are approximately stable in size as detailed in the report. IMPRESSION: 3. Stable appearance of 2 additional masses in the medial RIGHT breast. These have not been previously biopsied. Electronically Signed   By: Kristopher Oppenheim M.D.   On: 12/29/2020 14:52   Result Date: 12/29/2020 CLINICAL DATA:  55 year old female for re-evaluation after neoadjuvant chemotherapy. LABS:  None performed on site. EXAM: BILATERAL BREAST MRI WITH AND WITHOUT CONTRAST TECHNIQUE: Multiplanar, multisequence MR images of both breasts were obtained prior to and following the intravenous administration of 6 ml of Gadavist. Three-dimensional MR images were rendered by post-processing of the original MR data on an independent workstation. The three-dimensional MR images were interpreted, and findings are reported in the following complete MRI report for this study. Three dimensional images were evaluated at the independent interpreting workstation using the DynaCAD thin client. COMPARISON:  Previous exam(s). FINDINGS: Breast composition: d. Extreme fibroglandular tissue. Background parenchymal enhancement: Moderate. Right breast: Post biopsy changes again demonstrated in the inferior right breast at the site of the patient's index lesion. There has been marked interval decrease in size, with a lesion now measuring 6 x 5 x 8 mm (previously 2.6 x 2.5 x 2.2 cm). Additionally, there is been interval decrease in size of a mass in the upper outer right breast at anterior depth now measuring approximately 3 mm (previously 7 mm). This is  the site of the patient's additional MRI guided biopsy demonstrating sclerotic lesion with focal lobular neoplasia [facility]). Two additional masses in the medial left breast are approximately stable in size. The slightly more inferior and posterior mass measures 5-6 mm (previously 5 mm), and the additional upper outer mass measures 1.3 x 0.7 cm (previously 1.2 x 0.7 cm). No additional suspicious findings in the remainder of the right breast. Left breast: No mass or abnormal enhancement. Lymph nodes: No abnormal appearing lymph nodes. Ancillary findings: None. Previously described heterogenous enhancement of the sternum less conspicuous on today's study. IMPRESSION: 1. Interval decrease in size of inferior right breast index lesion now measuring 8 mm (previously 2.6 cm) consistent with good response to chemotherapy. 2. An additional site of biopsy proven focal lobular neoplasia in the upper outer right breast also demonstrates interval decrease in size, currently measuring 3 mm (previously 7 mm). 3. Stable appearance of 2 additional masses in the medial left breast. These have not been previously biopsied. 4. No suspicious findings in the remainder of the right breast or within the left breast. RECOMMENDATION: Per clinical treatment plan. BI-RADS CATEGORY  6: Known biopsy-proven malignancy. Electronically Signed: By: Kristopher Oppenheim M.D. On: 12/27/2020 14:37     ELIGIBLE FOR AVAILABLE RESEARCH PROTOCOL: AET  ASSESSMENT: 55 y.o. [place] woman status post right breast lower outer quadrant biopsy 08/04/2020 for a clinical T2 N0 M1, stage  IV invasive ductal carcinoma, grade 3, triple positive, with an MIB-1 of 40%  (a) breast MRI 08/14/2020 shows 3 additional right breast lesions and heterogeneous enhancement of the sternum  (b) biopsy of the additional right breast mass 09/05/2020 showed atypical lobular hyperplasia (concordant).  (c) CT scan of the abdomen and pelvis 07/14/2020 shows 2 indeterminate liver  lesions  (d) abdominal MRI 08/24/2020 shows multiple liver lesions highly suspicious for liver metastases.  (e) liver biopsy 09/20/2020 positive for carcinoma, prognostic panel estrogen and progesterone receptor positive, but HER-2 negative (0); MIB-1 of 30%  (f) bone scan 09/14/2020 shows no evidence of bone metastases  (g) CT scan of the chest and head 09/14/2020 showed no evidence of metastatic disease in the chest   (1) neoadjuvant chemotherapy consisting of docetaxel, carboplatin, trastuzumab and pertuzumab every 21 days x 6 started 08/31/2020, completed 12/14/2020  (a) pertuzumab omitted starting with cycle 4  (b) docetaxel omitted with final cycle, gemzar substituted  (2) anti-HER-2 immunotherapy (trastuzumab) to continue indefinitely  (a) echo 04/12/2020 shows an ejection fraction in the 60-65% range  (b) echo 09/25/2020 shows an ejection fraction in the 60 to 65% range  (c) echo  (3) definitive surgery to follow depending on optimal response to systemic therapy  (4) adjuvant radiation to follow surgery as appropriate  (5) anastrozole started 01/09/2021  (a) palbociclib/Ibrance to be added (orders placed 01/03/2021)  (6) staging studies:  (a) post-NEO chemotherapy breast MRI: index lesion decreased to 0.8 cm  (b) abdominal and lumbar MRI  (c) tumor markers   PLAN: Erica Wood is now off chemo and off steroids and her rheumatoid arthritis is beginning to act up.  She has been placed back on Plaquenil.  I have explained to her that what ever she and Dr. Neoma Laming sure wish to do to control that problem is fine with [place].  She understands she will be on anti-HER-2 treatment indefinitely.  She would like to get rid of her port.  Today as she will receive treatment by way of the port but next treatment in 3weeks she will receive it subcutaneously.  I have had some patients who hate that so I advised her not to get her port removed until she knows she is going to prefer the subcutaneous route  but if she does after that we can remove the port  She understands that she needs to be on systemic treatment that we will treat both the breast and the liver and that means antiestrogens.  She will be on anastrozole together with palbociclib.  I discussed the possible toxicities side effects and complications of both these agents.  I entered the appropriate orders and she will start the anastrozole now.  As soon as she gets the palbociclib, which will be mailed to her, she will call us and let us know.  Her Restasis has improved her epiphora slightly but not enough.  I have asked my ophthalmologist if she would be willing to to see Erica Wood and help her get her canaliculi recanalized.  I am obtaining a new baseline MRI of the liver and also a lumbar MRI given her new complaint of low back pain  She will see me again in 3 weeks.  She knows to call for any other issue that may develop before then  Total encounter time 40 minutes.Erica Jews C. Courtenay Creger, MD 01/03/2021 12:44 PM Medical Oncology and Hematology [facility] [place], [place] [address] Tel. [phone]    Fax.  [phone]   This document serves as a record of services personally performed by Lurline Del, MD. It was created on his behalf by Wilburn Mylar, a trained medical scribe. The creation of this record is based on the scribe's personal observations and the provider's statements to them.   I, Lurline Del MD, have reviewed the above documentation for accuracy and completeness, and I agree with the above.   *Total Encounter Time as defined by the Centers for Medicare and Medicaid Services includes, in addition to the face-to-face time of a patient visit (documented in the note above) non-face-to-face time: obtaining and reviewing outside history, ordering and reviewing medications, tests or procedures, care coordination (communications with other health care professionals or caregivers) and  documentation in the medical record.

## 2021-01-03 ENCOUNTER — Other Ambulatory Visit: Payer: Self-pay | Admitting: Oncology

## 2021-01-03 ENCOUNTER — Inpatient Hospital Stay: Payer: 59

## 2021-01-03 ENCOUNTER — Other Ambulatory Visit: Payer: Self-pay

## 2021-01-03 ENCOUNTER — Telehealth: Payer: Self-pay

## 2021-01-03 ENCOUNTER — Inpatient Hospital Stay: Payer: 59 | Admitting: Oncology

## 2021-01-03 ENCOUNTER — Encounter: Payer: Self-pay | Admitting: *Deleted

## 2021-01-03 ENCOUNTER — Telehealth: Payer: Self-pay | Admitting: Pharmacist

## 2021-01-03 VITALS — BP 107/70 | HR 78 | Temp 98.6°F | Resp 18 | Ht 64.0 in | Wt 116.1 lb

## 2021-01-03 DIAGNOSIS — C50511 Malignant neoplasm of lower-outer quadrant of right female breast: Secondary | ICD-10-CM

## 2021-01-03 DIAGNOSIS — M359 Systemic involvement of connective tissue, unspecified: Secondary | ICD-10-CM

## 2021-01-03 DIAGNOSIS — Z86711 Personal history of pulmonary embolism: Secondary | ICD-10-CM

## 2021-01-03 DIAGNOSIS — L930 Discoid lupus erythematosus: Secondary | ICD-10-CM

## 2021-01-03 DIAGNOSIS — C787 Secondary malignant neoplasm of liver and intrahepatic bile duct: Secondary | ICD-10-CM | POA: Diagnosis not present

## 2021-01-03 DIAGNOSIS — Z5112 Encounter for antineoplastic immunotherapy: Secondary | ICD-10-CM | POA: Diagnosis not present

## 2021-01-03 DIAGNOSIS — Z170 Estrogen receptor positive status [ER+]: Secondary | ICD-10-CM

## 2021-01-03 LAB — COMPREHENSIVE METABOLIC PANEL
ALT: 25 U/L (ref 0–44)
AST: 24 U/L (ref 15–41)
Albumin: 4.1 g/dL (ref 3.5–5.0)
Alkaline Phosphatase: 98 U/L (ref 38–126)
Anion gap: 7 (ref 5–15)
BUN: 7 mg/dL (ref 6–20)
CO2: 28 mmol/L (ref 22–32)
Calcium: 9 mg/dL (ref 8.9–10.3)
Chloride: 101 mmol/L (ref 98–111)
Creatinine, Ser: 0.81 mg/dL (ref 0.44–1.00)
GFR, Estimated: >60 mL/min (ref >60)
Glucose, Bld: 82 mg/dL (ref 70–99)
Potassium: 3.8 mmol/L (ref 3.5–5.1)
Sodium: 136 mmol/L (ref 135–145)
Total Bilirubin: 0.3 mg/dL (ref 0.3–1.2)
Total Protein: 6.7 g/dL (ref 6.5–8.1)

## 2021-01-03 LAB — CBC WITH DIFFERENTIAL/PLATELET
Abs Immature Granulocytes: 0.03 10*3/uL (ref 0.00–0.07)
Basophils Absolute: 0.1 10*3/uL (ref 0.0–0.1)
Basophils Relative: 1 %
Eosinophils Absolute: 0 10*3/uL (ref 0.0–0.5)
Eosinophils Relative: 0 %
HCT: 36.7 % (ref 36.0–46.0)
Hemoglobin: 11.5 g/dL — ABNORMAL LOW (ref 12.0–15.0)
Immature Granulocytes: 1 %
Lymphocytes Relative: 28 %
Lymphs Abs: 1.7 10*3/uL (ref 0.7–4.0)
MCH: 31.9 pg (ref 26.0–34.0)
MCHC: 31.3 g/dL (ref 30.0–36.0)
MCV: 101.7 fL — ABNORMAL HIGH (ref 80.0–100.0)
Monocytes Absolute: 0.4 10*3/uL (ref 0.1–1.0)
Monocytes Relative: 7 %
Neutro Abs: 3.8 10*3/uL (ref 1.7–7.7)
Neutrophils Relative %: 63 %
Platelets: 431 10*3/uL — ABNORMAL HIGH (ref 150–400)
RBC: 3.61 MIL/uL — ABNORMAL LOW (ref 3.87–5.11)
RDW: 13.6 % (ref 11.5–15.5)
WBC: 6 10*3/uL (ref 4.0–10.5)
nRBC: 0 % (ref 0.0–0.2)

## 2021-01-03 LAB — PROTIME-INR
INR: 1.9 — ABNORMAL HIGH (ref 0.8–1.2)
Prothrombin Time: 20.8 seconds — ABNORMAL HIGH (ref 11.4–15.2)

## 2021-01-03 LAB — CEA (IN HOUSE-CHCC): CEA (CHCC-In House): 1.88 ng/mL (ref 0.00–5.00)

## 2021-01-03 MED ORDER — ANASTROZOLE 1 MG PO TABS: 1.0000 mg | ORAL_TABLET | Freq: Every day | ORAL | 4 refills | Status: DC

## 2021-01-03 MED ORDER — TRASTUZUMAB-ANNS CHEMO 150 MG IV SOLR
6.0000 mg/kg | Freq: Once | INTRAVENOUS | Status: AC
  Administered 2021-01-03: 315 mg via INTRAVENOUS
  Filled 2021-01-03: qty 15

## 2021-01-03 MED ORDER — DIPHENHYDRAMINE HCL 25 MG PO CAPS
25.0000 mg | ORAL_CAPSULE | Freq: Once | ORAL | Status: AC
  Administered 2021-01-03: 25 mg via ORAL

## 2021-01-03 MED ORDER — PALBOCICLIB 125 MG PO TABS: 125.0000 mg | ORAL_TABLET | Freq: Every day | ORAL | 6 refills | Status: DC

## 2021-01-03 MED ORDER — ACETAMINOPHEN 325 MG PO TABS
ORAL_TABLET | ORAL | Status: AC
  Filled 2021-01-03: qty 2

## 2021-01-03 MED ORDER — CYCLOSPORINE 0.05 % OP EMUL: 1.0000 [drp] | Freq: Two times a day (BID) | OPHTHALMIC | 1 refills | Status: DC

## 2021-01-03 MED ORDER — PALBOCICLIB 125 MG PO CAPS: 125.0000 mg | ORAL_CAPSULE | Freq: Every day | ORAL | 6 refills | Status: DC

## 2021-01-03 MED ORDER — HEPARIN SOD (PORK) LOCK FLUSH 100 UNIT/ML IV SOLN
500.0000 [IU] | Freq: Once | INTRAVENOUS | Status: DC | PRN
  Filled 2021-01-03: qty 5

## 2021-01-03 MED ORDER — SODIUM CHLORIDE 0.9 % IV SOLN
Freq: Once | INTRAVENOUS | Status: AC
  Filled 2021-01-03: qty 250

## 2021-01-03 MED ORDER — ACETAMINOPHEN 325 MG PO TABS
650.0000 mg | ORAL_TABLET | Freq: Once | ORAL | Status: AC
  Administered 2021-01-03: 650 mg via ORAL

## 2021-01-03 MED ORDER — SODIUM CHLORIDE 0.9% FLUSH
10.0000 mL | INTRAVENOUS | Status: DC | PRN
  Filled 2021-01-03: qty 10

## 2021-01-03 MED ORDER — DIPHENHYDRAMINE HCL 25 MG PO CAPS
ORAL_CAPSULE | ORAL | Status: AC
  Filled 2021-01-03: qty 1

## 2021-01-03 MED ORDER — SODIUM CHLORIDE 0.9 % IV SOLN
6.0000 mg/kg | Freq: Once | INTRAVENOUS | Status: DC
Start: 2021-01-03 — End: 2021-01-03

## 2021-01-03 NOTE — Patient Instructions (Signed)
[facility]  Today you received the following immunotherapy agents Trastuzumab  To help prevent nausea and vomiting after your treatment, we encourage you to take your nausea medication as directed.   If you develop nausea and vomiting that is not controlled by your nausea medication, call the clinic.   BELOW ARE SYMPTOMS THAT SHOULD BE REPORTED IMMEDIATELY:  *FEVER GREATER THAN 100.5 F  *CHILLS WITH OR WITHOUT FEVER  NAUSEA AND VOMITING THAT IS NOT CONTROLLED WITH YOUR NAUSEA MEDICATION  *UNUSUAL SHORTNESS OF BREATH  *UNUSUAL BRUISING OR BLEEDING  TENDERNESS IN MOUTH AND THROAT WITH OR WITHOUT PRESENCE OF ULCERS  *URINARY PROBLEMS  *BOWEL PROBLEMS  UNUSUAL RASH Items with * indicate a potential emergency and should be followed up as soon as possible.  Feel free to call the clinic should you have any questions or concerns. The clinic phone number is [phone].  Please show the [place] at check-in to the Emergency Department and triage nurse.

## 2021-01-03 NOTE — Telephone Encounter (Signed)
Oral Oncology Patient Advocate Encounter  Received notification from [employer] that prior authorization for [facility] is required.  PA submitted on CoverMyMeds Key [id number] Status is pending  Oral Oncology Clinic will continue to follow.  [place] Patient [place] Phone [phone] Fax [phone] 01/03/2021 1:30 PM

## 2021-01-03 NOTE — Telephone Encounter (Signed)
Oral Oncology Patient Advocate Encounter  Prior Authorization for [facility] has been approved.    PA# [id number] Effective dates: 01/03/21 through 01/03/22  Patient must fill with [facility] will continue to follow.    [place] Patient [place] Phone [phone] Fax [phone] 01/03/2021 1:55 PM

## 2021-01-03 NOTE — Progress Notes (Signed)
Pt discharged in no apparent distress. Pt left ambulatory without assistance. Pt aware of discharge instructions and verbalized understanding and had no further questions.  

## 2021-01-03 NOTE — Telephone Encounter (Addendum)
Oral Oncology Pharmacist Encounter  Received new prescription for Ibrance (palbociclib) for the treatment of metastatic HR positive, HER-2 negative breast cancer in conjunction with anastrozole, planned duration until disease progression or unacceptable drug toxicity.  Prescription dose and frequency assessed for appropriateness. Appropriate for therapy initiation.   CMP and CBC w/ Diff from 01/03/21 assessed, no dose adjustments warranted at this time. Noted negative pregnancy test documented 12/13/20.  Current medication list in Epic reviewed, DDIs with Ibrance identified:  Category C DDI between Ibrance and fluconazole - will confirm if patient is still taking, fluconazole, a CYP3A4 inhibitor, can increase serum concentrations of Ibrance. Noted this was last filled 10/25/20 for 30 day supply.   Evaluated chart and no patient barriers to medication adherence noted.   Patient agreement for Ibrance treatment documented in MD note on 01/03/21.  Patient's insurance requires Leslee Home be filled through [employer] - prescription has been redirected for dispensing.   Oral Oncology Clinic will continue to follow for insurance authorization, copayment issues, initial counseling and start date.  Leron Croak, PharmD, BCPS Hematology/Oncology Clinical Pharmacist [facility] [phone] 01/03/2021 3:15 PM

## 2021-01-04 ENCOUNTER — Ambulatory Visit: Payer: 59 | Admitting: Adult Health

## 2021-01-04 ENCOUNTER — Other Ambulatory Visit: Payer: Self-pay | Admitting: Pharmacist

## 2021-01-04 ENCOUNTER — Other Ambulatory Visit: Payer: 59

## 2021-01-04 ENCOUNTER — Telehealth: Payer: Self-pay | Admitting: Oncology

## 2021-01-04 ENCOUNTER — Telehealth: Payer: Self-pay | Admitting: Pharmacist

## 2021-01-04 ENCOUNTER — Inpatient Hospital Stay: Payer: 59

## 2021-01-04 LAB — CA 125: Cancer Antigen (CA) 125: 29.9 U/mL (ref 0.0–38.1)

## 2021-01-04 LAB — CANCER ANTIGEN 27.29: CA 27.29: 91.9 U/mL — ABNORMAL HIGH (ref 0.0–38.6)

## 2021-01-04 NOTE — Telephone Encounter (Signed)
Scheduled appts per 2/23 los. Pt confrimed appt dates/times.

## 2021-01-04 NOTE — Telephone Encounter (Signed)
  Oncology Clinical Pharmacist Note   Erica Wood is a 55 y.o. female with a diagnosis of breast cancer currently on trastuzumab-anns (Kanjinti) under the care of Dr. Tressa Busman.  Dr. Jana Hakim asked clinical pharmacy to contact [name] today regarding her trastuzumab-anns.  [name] had inquired about using the subcutaneous version of trastuzumab which is Trastuzumab and hyaluronidase (Herceptin Hylecta). Financial navigator Drucie Opitz had researched the medication but unfortunately [name] insurance would not cover.  This was communicated to [name] who expressed understanding.  We reviewed that her next appointment for trastuzumab-anns is scheduled for 01/24/21 and if she has any questions or concerns in the interim to call the clinic number which was provided to her at [phone].    Clinical pharmacy will continue to support [name] and Dr. Jana Hakim as needed.  Raina Mina, [place],  01/04/2021  3:48 PM

## 2021-01-04 NOTE — Progress Notes (Signed)
The following biosimilar Kanjinti (trastuzumab-anns) has been selected for use in this patient. Dr. Jana Hakim and financial [place] team aware.  Signing per protocol, cosign required.  Raina Mina, [place], 01/04/2021  11:44 AM

## 2021-01-05 ENCOUNTER — Other Ambulatory Visit: Payer: Self-pay

## 2021-01-05 MED ORDER — AMPHETAMINE-DEXTROAMPHET ER 20 MG PO CP24: 20.0000 mg | ORAL_CAPSULE | Freq: Every day | ORAL | 0 refills | Status: DC

## 2021-01-05 NOTE — Telephone Encounter (Signed)
Patient called requesting prescription refill of Adderall to be sent to [facility] at [address].   

## 2021-01-05 NOTE — Telephone Encounter (Signed)
Last Visit: 12/28/2020 Next Visit: 05/29/2021  Current Dose per office note on 12/28/2020, not discussed Dx: Autoimmune disease   Last Fill:12/11/2020  Okay to refill Adderall?

## 2021-01-08 NOTE — Telephone Encounter (Signed)
Oral Chemotherapy Pharmacist Encounter  I spoke with patient for overview of: Ibrance for the treatment of metastatic, hormone-receptor positive, HER2 receptor negative breast cancer, in combination with anastrozole, planned duration until disease progression or unacceptable toxicity.   Counseled patient on administration, dosing, side effects, monitoring, drug-food interactions, safe handling, storage, and disposal.  Patient will take Ibrance 179m tablets, 1 tablet by mouth once daily, with or without food, taken for 3 weeks on, 1 week off, and repeated.  Patient knows to avoid grapefruit and grapefruit juice while on treatment with Ibrance.  Patient has received anastrozole but has not started taking medication yet due to fear of risk of side effects. Reviewed with patient in detail and answered questions regarding side effects to monitor for with anastrozole.  Ibrance start date: 01/10/21  Adverse effects include but are not limited to: fatigue, hair loss, nausea, decreased blood counts, and increased upper respiratory infections. Severe, life-threatening, and/or fatal interstitial lung disease (ILD) and/or pneumonitis may occur with CDK 4/6 inhibitors.  Patient reminded of WBC check on Cycle 1 Day 14 for dose and ANC assessment.  Reviewed with patient importance of keeping a medication schedule and plan for any missed doses. No barriers to medication adherence identified.  Medication reconciliation performed and medication/allergy list updated.  Insurance authorization for ILeslee Homehas been obtained. Patient's insurance requires that ILeslee Homebe filled through [employer] This will be delivered to the patient's home on 01/09/21.   All questions answered.  Ms. SKruckenbergvoiced understanding and appreciation.   Medication education handout placed in mail for patient. Patient knows to call the office with questions or concerns. Oral Chemotherapy Clinic phone number provided to  patient.   RLeron Croak PharmD, BCPS Hematology/Oncology Clinical Pharmacist [phone]/28/2022 2:07 PM

## 2021-01-10 ENCOUNTER — Ambulatory Visit (HOSPITAL_COMMUNITY)
Admission: RE | Admit: 2021-01-10 | Discharge: 2021-01-10 | Disposition: A | Discharge status: Home / Self Care | Payer: 59 | Source: Ambulatory Visit | Attending: Oncology | Admitting: Oncology

## 2021-01-10 ENCOUNTER — Other Ambulatory Visit: Payer: Self-pay

## 2021-01-10 DIAGNOSIS — C787 Secondary malignant neoplasm of liver and intrahepatic bile duct: Secondary | ICD-10-CM | POA: Diagnosis not present

## 2021-01-10 DIAGNOSIS — C50511 Malignant neoplasm of lower-outer quadrant of right female breast: Secondary | ICD-10-CM | POA: Diagnosis present

## 2021-01-10 DIAGNOSIS — Z170 Estrogen receptor positive status [ER+]: Secondary | ICD-10-CM

## 2021-01-10 DIAGNOSIS — Z86711 Personal history of pulmonary embolism: Secondary | ICD-10-CM | POA: Insufficient documentation

## 2021-01-10 DIAGNOSIS — L930 Discoid lupus erythematosus: Secondary | ICD-10-CM | POA: Diagnosis not present

## 2021-01-10 DIAGNOSIS — I351 Nonrheumatic aortic (valve) insufficiency: Secondary | ICD-10-CM | POA: Diagnosis not present

## 2021-01-10 LAB — ECHOCARDIOGRAM COMPLETE
Area-P 1/2: 3.42 cm2
P 1/2 time: 674 msec
S' Lateral: 3.4 cm

## 2021-01-10 NOTE — Progress Notes (Signed)
  Echocardiogram 2D Echocardiogram has been performed.  Erica Wood 01/10/2021, 10:18 AM

## 2021-01-16 ENCOUNTER — Telehealth: Payer: Self-pay

## 2021-01-16 NOTE — Telephone Encounter (Signed)
Spoke with patient, she has been scheduled for a new patient appointment on Tuesday, 02/06/21 at 4 PM. Advised patient to bring updated insurance information and I will mail new patient paperwork to her address on file. Patient has been provided with the address to our office.  Patient verbalized understanding and had no concerns at the end of the call.

## 2021-01-16 NOTE — Telephone Encounter (Signed)
-----   Message from Yetta Flock, MD sent at 01/16/2021  1:15 PM EST ----- Regarding: new consult Latrease Kunde I received a consult from Dr. Donne Hazel to see this patient for constipation as office visit. Can you help schedule? Thanks  Dr. Loni Muse

## 2021-01-16 NOTE — Telephone Encounter (Signed)
Returned call to pt regarding pt's message about possible big toe infection. Pt states her toes have "chemo nails"  Her big toe had been red and swollen with some oozing. Pt has been soaking in epsom salts and applying neosporin. Pt states is is no longer as red or swollen. Instructed pt to continue doing what she has been doing and call if it gets any worse, she develops red streaks on her foot or has a fever. Pt verbalized understanding.

## 2021-01-23 ENCOUNTER — Encounter: Payer: Self-pay | Admitting: Family Medicine

## 2021-01-23 NOTE — Progress Notes (Addendum)
[place]  [phone] [phone]     ID: Erica Wood DOB: [birth year]  MR#: [id number]  FXT#:[id number]  Patient Care Team: Eunice Blase, MD as PCP - General (Family Medicine) Prudence Davidson, [place] Bing, MD as Referring Physician (Oncology) Rockwell Germany, RN as Oncology Nurse Navigator Mauro Kaufmann, RN as Oncology Nurse Navigator Rolm Bookbinder, MD as Consulting Physician (General Surgery) Myracle Febres, Virgie Dad, MD as Consulting Physician (Oncology) Kyung Rudd, MD as Consulting Physician (Radiation Oncology) Bo Merino, MD as Consulting Physician (Rheumatology) Olga Millers, MD as Consulting Physician (Obstetrics and Gynecology) Larey Dresser, MD as Consulting Physician (Cardiology) Armbruster, Carlota Raspberry, MD as Consulting Physician (Gastroenterology) Chauncey Cruel, MD OTHER MD:  CHIEF COMPLAINT: triple positive stage IV breast cancer  CURRENT TREATMENT: herceptin; anastrozole, palbociclib   INTERVAL HISTORY: Erica Wood returns today for follow up and treatment of her triple positive breast cancer.   She continues on trastuzumab indefinitely, repeated every 21 days.  She is interested in trying the subcutaneous form but we have not been able to get approval from insurance to give that a try  She was supposed to have started on anastrozole and palbociclib at her last visit on 01/03/2021.  However she says that she read some side effects that worried her and she decided she did not want to try it until meeting with me today.  Since her last visit, she underwent repeat echocardiogram on 01/10/2021 showing an ejection fraction of 60-65%.  She is scheduled for abdomen MRI and lumbar spine MRI on 01/27/2021.  Lab Results  Component Value Date   CA2729 91.9 (H) 01/03/2021    REVIEW OF SYSTEMS: Erica Wood had an infection of her right big toenail.  From her description it seems to have been a slightly ingrown toenail on the medial  aspect of the big toenail.  I examined that today and it is perfectly fine with no swelling redness or heat or evidence of inflammation.  The top of the foot looks a little bit bruised but she does not know of any trauma there.  She has not been able to get an eye appointment and she still has a little bit of tearing but she says the Restasis really is helping and she is pretty much almost done with that.  Her hair has not grown back.  She still looks pretty bald.  She has gained a few pounds and she is very worried about further weight gain.  She is exercising by walking 30 minutes on good days.  She also has a stationary bike and she also likes to bike outside.  Sleep is plus minus.  A detailed review of systems today is otherwise stable.   COVID 19 VACCINATION STATUS: Status post Pfizer x1   HISTORY OF CURRENT ILLNESS: From the original intake note:  Erica Wood herself palpated an abnormality in the right breast several years ago.  This had not changed until sometime in June 2021.  She was evaluated by Dr. Ouida Sills and underwent right diagnostic mammography with tomography and right breast ultrasonography at [facility] on 07/26/2020 showing: breast density category D; 2.6 cm mass in right breast at 6 o'clock, at site of palpable concern; two focal groups of punctate calcifications posterior to palpable mass; no right axillary adenopathy.  Accordingly on 08/04/2020 she proceeded to biopsy of the right breast area in question. The pathology from this procedure ([id number]) showed: invasive ductal carcinoma, grade 3. Prognostic indicators significant for: estrogen receptor, 95% positive  with moderate staining intensity and progesterone receptor, 10% positive with strong staining intensity. Proliferation marker Ki67 at 40%. HER2 equivocal by immunohistochemistry (2+), but positive by fluorescent in situ hybridization with a signals ratio 3.23 and number per cell 6.45.  Biopsy of the posterior  calcifications was benign.  She also underwent breast MRI on 08/11/2020 showing: breast composition D; 2.6 cm biopsy-proven malignancy in right breast at 6 o'clock, with probable skin involvement noted on previous ultrasound; additional enhancing masses within right breast-- 5 mm in lower-outer, 1.2 cm in upper-inner, 7 mm in upper-outer; no evidence of left breast malignancy or lymphadenopathy; heterogeneous enhancement of sternum, of uncertain significance.  The patient's subsequent history is as detailed below.   PAST MEDICAL HISTORY: Past Medical History:  Diagnosis Date  . Anti-cardiolipin antibody positive   . Anti-cardiolipin antibody syndrome (HCC)   . Anxiety   . Arthralgia   . Arthritis    bil knees, hands  . Atypical chest pain   . Autoimmune disease ([place])   . Cancer ([place]) 07/2020   right breast IDC  . Chronic fatigue   . Depression   . DVT (deep venous thrombosis) ([place])   . IBS (irritable bowel syndrome)    with constipation  . Lupus ([place])   . Pulmonary embolus ([place])   . Raynaud's syndrome   . Sicca ([place])   . Thrombocytopenia ([place])     PAST SURGICAL HISTORY: Past Surgical History:  Procedure Laterality Date  . ABLATION  12/2018   leg veins  . ABLATION Right 08/2019   venous leg   . APPENDECTOMY    . COLON SURGERY     Perforated colon repair after colostomy  . GANGLION CYST EXCISION    . HYSTEROSCOPY WITH D & C N/A 01/13/2014   Procedure: DILATATION AND CURETTAGE /HYSTEROSCOPY with resection ;  Surgeon: Olga Millers, MD;  Location: [facility];  Service: Gynecology;  Laterality: N/A;  . LAPAROTOMY    . PORTACATH PLACEMENT N/A 09/13/2020   Procedure: INSERTION PORT-A-CATH WITH ULTRASOUND GUIDANCE;  Surgeon: Rolm Bookbinder, MD;  Location: [facility];  Service: General;  Laterality: N/A;    FAMILY HISTORY: Family History  Problem Relation Age of Onset  . Rheum arthritis Father   . Hemachromatosis Father   . Cancer Sister        bone   as  of October 2021 her father isage 27 and her mother age 1.. The patient had one sister (and no brothers). The sister was diagnosed with Ewing's sarcoma at age 52, and died from the disease after many difficult treatments.    GYNECOLOGIC HISTORY:  Last menstrual period occurred about 6 months prior to the start of chemotherapy Menarche: 44 or 55 years old GX P 0 LMP around 2019 Contraceptive: yes HRT no  Hysterectomy? no BSO? 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 She is widowed. She lives at home with her cat, Mariea Clonts. She is not a Ambulance person.    ADVANCED DIRECTIVES: not in place; at the 08/16/2020 visit the patient was given the appropriate documents to complete and notarized at her discretion.  She tells me she intends to name her significant other fill as her healthcare power of attorney   HEALTH MAINTENANCE: Social History   Tobacco Use  . Smoking status: Former Smoker    Packs/day: 0.50    Years: 5.00    Pack years: 2.50    Types: Cigarettes  Start date: 08/06/2006    Quit date: 08/07/2011    Years since quitting: 9.4  . Smokeless tobacco: Never Used  Vaping Use  . Vaping Use: Never used  Substance Use Topics  . Alcohol use: Yes    Comment: occasionally  . Drug use: No     Colonoscopy: 2002 for perforated bowel  PAP: 03/2020  Bone density: done at Dr. [facility] office   Allergies  Allergen Reactions  . Beef-Derived Products Hives  . Latex Rash    Contact  . Sulfa Antibiotics Rash    Current Outpatient Medications  Medication Sig Dispense Refill  . acetaminophen (TYLENOL) 500 MG tablet Take 1,000 mg by mouth every 6 (six) hours as needed for moderate pain or headache.    . amphetamine-dextroamphetamine (ADDERALL XR) 20 MG 24 hr capsule Take 1 capsule (20 mg total) by mouth daily. 30 capsule 0  . anastrozole (ARIMIDEX) 1 MG tablet Take 1 tablet (1 mg total) by mouth daily. 90 tablet 4  . Ascorbic Acid  (VITAMIN C PO) Take 3,000 mg by mouth daily.     Marland Kitchen CAMILA 0.35 MG tablet Take 1 tablet by mouth daily.  11  . Cholecalciferol (VITAMIN D3) 75 MCG (3000 UT) TABS Take 3,000 Units by mouth daily.    . cycloSPORINE (RESTASIS) 0.05 % ophthalmic emulsion Place 1 drop into both eyes 2 (two) times daily. 5.5 mL 1  . Docusate Sodium (COLACE PO) Take 2 tablets by mouth 2 (two) times daily.    . Fluocinonide 0.1 % CREA Apply 1 application topically 2 (two) times daily. 240 g 2  . hydroxychloroquine (PLAQUENIL) 200 MG tablet TAKE 1 TABLET BY MOUTH TWICE DAILY MONDAY THROUGH FRIDAY 120 tablet 0  . loratadine (CLARITIN) 10 MG tablet Take 10 mg by mouth See admin instructions. Take 10 mg daily for 5 days after chemo treatments    . magic mouthwash SOLN Take 5 mLs by mouth 4 (four) times daily as needed for mouth pain. 240 mL 0  . metoCLOPramide (REGLAN) 10 MG tablet Take 1 tablet (10 mg total) by mouth every 6 (six) hours as needed for nausea. 40 tablet 2  . metroNIDAZOLE (METROGEL) 1 % gel Apply topically daily. 45 g 0  . oxyCODONE (OXY IR/ROXICODONE) 5 MG immediate release tablet Take 1 tablet (5 mg total) by mouth every 6 (six) hours as needed. (Patient not taking: Reported on 12/28/2020) 6 tablet 0  . palbociclib (IBRANCE) 125 MG tablet Take 1 tablet (125 mg total) by mouth daily. Take for 21 days on, 7 days off, repeat every 28 days. 21 tablet 6  . Pegfilgrastim (NEULASTA [place]) Inject 1 Dose into the skin See admin instructions. Inject 24 to 72 hours after chemo treatment    . polyethylene glycol (MIRALAX / GLYCOLAX) 17 g packet Take 17 g by mouth daily.    Marland Kitchen spironolactone (ALDACTONE) 25 MG tablet Take 25 mg by mouth daily as needed (swelling).    . traMADol (ULTRAM) 50 MG tablet Take 1-2 tablets (50-100 mg total) by mouth every 6 (six) hours as needed. (Patient taking differently: Take 50-100 mg by mouth every 6 (six) hours as needed for moderate pain.) 60 tablet 0  . tretinoin (RETIN-A) 0.1 % cream Apply 1  application topically at bedtime as needed (acne).     . triamcinolone lotion (KENALOG) 0.1 % Apply 1 application topically 3 (three) times daily. 120 mL 2  . triamterene-hydrochlorothiazide (DYAZIDE) 37.5-25 MG capsule TAKE 1 CAPSULE BY MOUTH DAILY AS  NEEDED FOR SWELLING 90 capsule 0  . UNABLE TO FIND Green Vibrance - 1 scoop daily    . warfarin (COUMADIN) 5 MG tablet Take 1 tablet (5 mg total) by mouth daily. 30 tablet 11   No current facility-administered medications for this visit.   Facility-Administered Medications Ordered in Other Visits  Medication Dose Route Frequency Provider Last Rate Last Admin  . sodium chloride flush (NS) 0.9 % injection 10 mL  10 mL Intracatheter PRN Ciara Kagan, Virgie Dad, MD   10 mL at 01/24/21 1047    OBJECTIVE: White woman who appears stated age  Vitals:   01/24/21 0809  BP: 123/70  Pulse: 72  Resp: 20  Temp: 98.1 F (36.7 C)  SpO2: 100%     Body mass index is 19.84 kg/m.   Wt Readings from Last 3 Encounters:  01/24/21 115 lb 9.6 oz (52.4 kg)  01/03/21 116 lb 1.6 oz (52.7 kg)  12/13/20 112 lb 14.4 oz (51.2 kg)      ECOG FS:2 - Symptomatic, <50% confined to bed  Continues glabrous Sclerae unicteric, EOMs intact epiphora not evident Wearing a mask No cervical or supraclavicular adenopathy Lungs no rales or rhonchi Heart regular rate and rhythm Abd soft, nontender, positive bowel sounds MSK no focal spinal tenderness, no upper extremity lymphedema Neuro: nonfocal, well oriented, appropriate affect Breasts: The mass in the right breast or at least a mass is palpable.  It is difficult to examine because she does have bilaterally lumpy breasts.  There are no skin or nipple changes.  Both axillae are benign.   LAB RESULTS:  CMP     Component Value Date/Time   NA 139 01/24/2021 0739   NA 137 07/28/2020 1108   K 3.8 01/24/2021 0739   CL 101 01/24/2021 0739   CO2 29 01/24/2021 0739   GLUCOSE 78 01/24/2021 0739   BUN 11 01/24/2021 0739    BUN 8 07/28/2020 1108   CREATININE 0.86 01/24/2021 0739   CREATININE 0.78 10/20/2020 1330   CALCIUM 9.2 01/24/2021 0739   PROT 6.9 01/24/2021 0739   PROT 7.3 07/28/2020 1108   ALBUMIN 4.2 01/24/2021 0739   ALBUMIN 4.9 07/28/2020 1108   AST 21 01/24/2021 0739   AST 19 09/21/2020 0827   ALT 15 01/24/2021 0739   ALT 27 09/21/2020 0827   ALKPHOS 94 01/24/2021 0739   BILITOT 0.6 01/24/2021 0739   BILITOT 0.4 09/21/2020 0827   GFRNONAA >60 01/24/2021 0739   GFRNONAA >60 10/20/2020 1330   GFRAA 95 07/28/2020 1108    No results found for: TOTALPROTELP, ALBUMINELP, A1GS, A2GS, BETS, BETA2SER, GAMS, MSPIKE, SPEI  Lab Results  Component Value Date   WBC 4.5 01/24/2021   NEUTROABS 2.1 01/24/2021   HGB 12.8 01/24/2021   HCT 39.5 01/24/2021   MCV 98.8 01/24/2021   PLT 148 (L) 01/24/2021    No results found for: LABCA2  No components found for: DJMEQA834  Recent Labs  Lab 01/24/21 0740  INR 3.2*    No results found for: LABCA2  No results found for: HDQ222  Lab Results  Component Value Date   CAN125 29.9 01/03/2021    No results found for: LNL892  Lab Results  Component Value Date   CA2729 91.9 (H) 01/03/2021    No components found for: HGQUANT  Lab Results  Component Value Date   CEA1 1.88 01/03/2021   /  CEA ([facility])  Date Value Ref Range Status  01/03/2021 1.88 0.00 - 5.00 ng/mL Final  Comment:    (NOTE) This test was performed using Architect's Chemiluminescent Microparticle Immunoassay. Values obtained from different assay methods cannot be used interchangeably. Please note that 5-10% of patients who smoke may see CEA levels up to 6.9 ng/mL. Performed at [facility], [place] [address], [place], [place] [address]      No results found for: AFPTUMOR  No results found for: CHROMOGRNA  No results found for: KPAFRELGTCHN, LAMBDASER, KAPLAMBRATIO (kappa/lambda light chains)  No results found for: HGBA, HGBA2QUANT,  HGBFQUANT, HGBSQUAN (Hemoglobinopathy evaluation)   No results found for: LDH  Lab Results  Component Value Date   IRON 88 10/29/2018   IRON 88 10/29/2018   TIBC 241 (L) 10/29/2018   TIBC 241 (L) 10/29/2018   IRONPCTSAT 37 10/29/2018   IRONPCTSAT 37 10/29/2018   (Iron and TIBC)  Lab Results  Component Value Date   FERRITIN 43 10/29/2018   FERRITIN 43 10/29/2018    Urinalysis    Component Value Date/Time   APPEARANCEUR Clear 08/08/2020 1359   GLUCOSEU Negative 08/08/2020 1359   BILIRUBINUR Negative 08/08/2020 1359   PROTEINUR Negative 08/08/2020 1359   NITRITE Negative 08/08/2020 1359   LEUKOCYTESUR Negative 08/08/2020 1359    STUDIES: MR BREAST BILATERAL W WO CONTRAST INC CAD  Addendum Date: 12/29/2020   ADDENDUM REPORT: 12/29/2020 14:52 ADDENDUM: This is a correction to the findings and impression section of the previously dictated report. The remainder of the FINDINGS, IMPRESSION and RECOMMENDATION are unchanged. FINDINGS: Two additional masses in the medial RIGHT breast are approximately stable in size as detailed in the report. IMPRESSION: 3. Stable appearance of 2 additional masses in the medial RIGHT breast. These have not been previously biopsied. Electronically Signed   By: Kristopher Oppenheim M.D.   On: 12/29/2020 14:52   Result Date: 12/29/2020 CLINICAL DATA:  55 year old female for re-evaluation after neoadjuvant chemotherapy. LABS:  None performed on site. EXAM: BILATERAL BREAST MRI WITH AND WITHOUT CONTRAST TECHNIQUE: Multiplanar, multisequence MR images of both breasts were obtained prior to and following the intravenous administration of 6 ml of Gadavist. Three-dimensional MR images were rendered by post-processing of the original MR data on an independent workstation. The three-dimensional MR images were interpreted, and findings are reported in the following complete MRI report for this study. Three dimensional images were evaluated at the independent interpreting  workstation using the DynaCAD thin client. COMPARISON:  Previous exam(s). FINDINGS: Breast composition: d. Extreme fibroglandular tissue. Background parenchymal enhancement: Moderate. Right breast: Post biopsy changes again demonstrated in the inferior right breast at the site of the patient's index lesion. There has been marked interval decrease in size, with a lesion now measuring 6 x 5 x 8 mm (previously 2.6 x 2.5 x 2.2 cm). Additionally, there is been interval decrease in size of a mass in the upper outer right breast at anterior depth now measuring approximately 3 mm (previously 7 mm). This is the site of the patient's additional MRI guided biopsy demonstrating sclerotic lesion with focal lobular neoplasia [facility]). Two additional masses in the medial left breast are approximately stable in size. The slightly more inferior and posterior mass measures 5-6 mm (previously 5 mm), and the additional upper outer mass measures 1.3 x 0.7 cm (previously 1.2 x 0.7 cm). No additional suspicious findings in the remainder of the right breast. Left breast: No mass or abnormal enhancement. Lymph nodes: No abnormal appearing lymph nodes. Ancillary findings: None. Previously described heterogenous enhancement of the sternum less conspicuous on today's study. IMPRESSION:  1. Interval decrease in size of inferior right breast index lesion now measuring 8 mm (previously 2.6 cm) consistent with good response to chemotherapy. 2. An additional site of biopsy proven focal lobular neoplasia in the upper outer right breast also demonstrates interval decrease in size, currently measuring 3 mm (previously 7 mm). 3. Stable appearance of 2 additional masses in the medial left breast. These have not been previously biopsied. 4. No suspicious findings in the remainder of the right breast or within the left breast. RECOMMENDATION: Per clinical treatment plan. BI-RADS CATEGORY  6: Known biopsy-proven malignancy. Electronically Signed: By: Kristopher Oppenheim M.D. On: 12/27/2020 14:37   ECHOCARDIOGRAM COMPLETE  Result Date: 01/10/2021    ECHOCARDIOGRAM REPORT   Patient Name:   KELBIE MORO [facility] Date of Exam: 01/10/2021 Medical Rec #:  [id number]            Height:       64.0 in Accession #:    [id number]           Weight:       116.1 lb Date of Birth:  11-02-1966            BSA:          1.553 m Patient Age:    10 years             BP:           107/70 mmHg Patient Gender: F                    HR:           78 bpm. Exam Location:  Outpatient Procedure: 2D Echo, Cardiac Doppler, Color Doppler and Strain Analysis                                 MODIFIED REPORT:      This report was modified by Rudean Haskell MD on 01/10/2021 due to                            Comparison to prior study.  Indications:     Chemo evaluation  History:         Patient has prior history of Echocardiogram examinations, most                  recent 09/25/2020. Breast cancer.  Sonographer:     Dustin Flock Referring Phys:  [id number] Chauncey Cruel Diagnosing Phys: Rudean Haskell MD IMPRESSIONS  1. Left ventricular ejection fraction, by estimation, is 60 to 65%. The left ventricle has normal function. The left ventricle has no regional wall motion abnormalities. Left ventricular diastolic parameters were normal. Left ventricular strain pattern is similar and similar loading conditions: -22% in the A2C most respresentative finding.  2. Right ventricular systolic function is normal. The right ventricular size is normal. There is normal pulmonary artery systolic pressure.  3. The mitral valve is grossly normal. Trivial mitral valve regurgitation.  4. The aortic valve is tricuspid. Aortic valve regurgitation is mild. No aortic stenosis is present.  5. The inferior vena cava is normal in size with greater than 50% respiratory variability, suggesting right atrial pressure of 3 mmHg. Comparison(s): A prior study was performed on 09/25/2020. No significant change from prior study. Prior  images reviewed side by side. FINDINGS  Left Ventricle: Left ventricular ejection fraction, by estimation, is  60 to 65%. The left ventricle has normal function. The left ventricle has no regional wall motion abnormalities. The left ventricular internal cavity size was normal in size. There is  no left ventricular hypertrophy. Left ventricular diastolic parameters were normal. Right Ventricle: The right ventricular size is normal. No increase in right ventricular wall thickness. Right ventricular systolic function is normal. There is normal pulmonary artery systolic pressure. The tricuspid regurgitant velocity is 2.40 m/s, and  with an assumed right atrial pressure of 3 mmHg, the estimated right ventricular systolic pressure is 92.4 mmHg. Left Atrium: Left atrial size was normal in size. Right Atrium: Right atrial size was normal in size. Pericardium: There is no evidence of pericardial effusion. Mitral Valve: The mitral valve is grossly normal. Trivial mitral valve regurgitation. Tricuspid Valve: The tricuspid valve is normal in structure. Tricuspid valve regurgitation is not demonstrated. No evidence of tricuspid stenosis. Aortic Valve: The aortic valve is tricuspid. Aortic valve regurgitation is mild. Aortic regurgitation PHT measures 674 msec. No aortic stenosis is present. Pulmonic Valve: The pulmonic valve was grossly normal. Pulmonic valve regurgitation is trivial. No evidence of pulmonic stenosis. Aorta: The aortic root is normal in size and structure. Venous: The inferior vena cava is normal in size with greater than 50% respiratory variability, suggesting right atrial pressure of 3 mmHg. IAS/Shunts: The atrial septum is grossly normal.  LEFT VENTRICLE PLAX 2D LVIDd:         4.90 cm  Diastology LVIDs:         3.40 cm  LV e' medial:    7.83 cm/s LV PW:         0.80 cm  LV E/e' medial:  8.0 LV IVS:        0.70 cm  LV e' lateral:   12.30 cm/s LVOT diam:     1.90 cm  LV E/e' lateral: 5.1 LV SV:         67 LV SV  Index:   43 LVOT Area:     2.84 cm  RIGHT VENTRICLE RV Basal diam:  3.10 cm RV S prime:     11.90 cm/s TAPSE (M-mode): 2.3 cm LEFT ATRIUM             Index       RIGHT ATRIUM           Index LA diam:        3.40 cm 2.19 cm/m  RA Area:     12.80 cm LA Vol (A2C):   37.1 ml 23.89 ml/m RA Volume:   30.90 ml  19.90 ml/m LA Vol (A4C):   26.5 ml 17.07 ml/m LA Biplane Vol: 32.1 ml 20.67 ml/m  AORTIC VALVE LVOT Vmax:   117.00 cm/s LVOT Vmean:  79.700 cm/s LVOT VTI:    0.238 m AI PHT:      674 msec  AORTA Ao Root diam: 2.90 cm MITRAL VALVE               TRICUSPID VALVE MV Area (PHT): 3.42 cm    TR Peak grad:   23.0 mmHg MV Decel Time: 222 msec    TR Vmax:        240.00 cm/s MV E velocity: 63.00 cm/s MV A velocity: 54.40 cm/s  SHUNTS MV E/A ratio:  1.16        Systemic VTI:  0.24 m  Systemic Diam: 1.90 cm Rudean Haskell MD Electronically signed by Rudean Haskell MD Signature Date/Time: 01/10/2021/1:44:06 PM    Final (Updated)      ELIGIBLE FOR AVAILABLE RESEARCH PROTOCOL: AET  ASSESSMENT: 55 y.o. Erica Wood woman status post right breast lower outer quadrant biopsy 08/04/2020 for a clinical T2 N0 M1, stage IV invasive ductal carcinoma, grade 3, triple positive, with an MIB-1 of 40%  (a) breast MRI 08/14/2020 shows 3 additional right breast lesions and heterogeneous enhancement of the sternum  (b) biopsy of the additional right breast mass 09/05/2020 showed atypical lobular hyperplasia (concordant).  (c) CT scan of the abdomen and pelvis 07/14/2020 shows 2 indeterminate liver lesions  (d) abdominal MRI 08/24/2020 shows multiple liver lesions highly suspicious for liver metastases.  (e) liver biopsy 09/20/2020 positive for carcinoma, prognostic panel estrogen and progesterone receptor positive, but HER-2 negative (0); MIB-1 of 30%  (f) bone scan 09/14/2020 shows no evidence of bone metastases  (g) CT scan of the chest and head 09/14/2020 showed no evidence of metastatic  disease in the chest or brain   (1) neoadjuvant chemotherapy consisting of docetaxel, carboplatin, trastuzumab and pertuzumab every 21 days x 6 started 08/31/2020, completed 12/14/2020  (a) pertuzumab omitted starting with cycle 4  (b) docetaxel omitted with final cycle, gemzar substituted  (2) anti-HER-2 immunotherapy (trastuzumab) to continue indefinitely  (a) echo 04/12/2020 shows an ejection fraction in the 60-65% range  (b) echo 09/25/2020 shows an ejection fraction in the 60 to 65% range  (c) echo 01/10/2021 shows an ejection fraction in the 60-65% range  (3) definitive surgery to follow depending on optimal response to systemic therapy  (4) adjuvant radiation to follow surgery as appropriate  (5) anastrozole to start 01/29/2021  (a) palbociclib/Ibrance to start 01/29/2021  (6) staging studies:  (a) post-NEO chemotherapy breast MRI: index lesion decreased to 0.8 cm  (b) abdominal and lumbar MRI  (c) tumor markers: CA 27-29 was 91.9 on 01/03/2021; CEA and CA-125 normal   PLAN: Brisia has not yet started her antiestrogens and CDK inhibitors.  I reviewed the possible toxicities side effects and complications of these agents.  She understands, as we reviewed today, that she has stage IV disease, and that her liver lesion in particular was read as HER-2 negative.  This means the anti-HER-2 treatment she is getting may not affect the liver.  She absolutely needs to get on antiestrogens and CDK inhibitors.  Every other alternative I have for her will produce more side effects not less.  After this discussion she is agreeable to start on 01/29/2021 on both those medications.  She understands that she will need to be under treatment for the rest of her life because this tumor is not curable.  I am concerned that her hair has not grown back.  There are rare cases of permanent alopecia from Taxotere.  I am hopeful she will not be 1 of these.  Other symptoms from the chemotherapy should be  improving.  We reviewed menopausal symptoms including weight gain insomnia mood changes weaker bones vaginal dryness hair thinning and hot flashes.  Some of these likely hot flashes and vaginal dryness may be made worse by the anastrozole.  If so we will be glad to work with her to improve those symptoms.  She is interested in referral to our pelvic rehab program and I have entered that for her.  I am going to call her with results of her restaging studies which will be done this weekend.  I  am going to see her on 02/14/2021 to discuss how she is doing on the anastrozole and palbociclib.  We are going to give it 1 more try at getting a subcutaneous Herceptin for her but we have not been successful so far  She knows to call for any other issue admittable before the next visit  Total encounter time 40 minutes.Sarajane Jews C. Meggin Ola, MD 01/24/2021 11:27 AM Medical Oncology and Hematology [facility] [place], [place] [address] Tel. [phone]    Fax. [phone]  Addendum: Insurance has rejected the pelvic MRI.  We may want to go back and do plain films of that area first and see if we can get it approved based those findings.;  I discussed it with Adonna   This document serves as a record of services personally performed by Lurline Del, MD. It was created on his behalf by Wilburn Mylar, a trained medical scribe. The creation of this record is based on the scribe's personal observations and the provider's statements to them.   I, Lurline Del MD, have reviewed the above documentation for accuracy and completeness, and I agree with the above.   *Total Encounter Time as defined by the Centers for Medicare and Medicaid Services includes, in addition to the face-to-face time of a patient visit (documented in the note above) non-face-to-face time: obtaining and reviewing outside history, ordering and reviewing medications, tests or procedures, care  coordination (communications with other health care professionals or caregivers) and documentation in the medical record.

## 2021-01-24 ENCOUNTER — Inpatient Hospital Stay (HOSPITAL_BASED_OUTPATIENT_CLINIC_OR_DEPARTMENT_OTHER): Payer: 59 | Admitting: Oncology

## 2021-01-24 ENCOUNTER — Other Ambulatory Visit: Payer: Self-pay

## 2021-01-24 ENCOUNTER — Other Ambulatory Visit: Payer: Self-pay | Admitting: Pharmacist

## 2021-01-24 ENCOUNTER — Inpatient Hospital Stay: Payer: 59

## 2021-01-24 ENCOUNTER — Inpatient Hospital Stay: Payer: 59 | Attending: Oncology

## 2021-01-24 ENCOUNTER — Other Ambulatory Visit: Payer: 59

## 2021-01-24 VITALS — BP 123/70 | HR 72 | Temp 98.1°F | Resp 20 | Ht 64.0 in | Wt 115.6 lb

## 2021-01-24 DIAGNOSIS — Z86718 Personal history of other venous thrombosis and embolism: Secondary | ICD-10-CM | POA: Diagnosis not present

## 2021-01-24 DIAGNOSIS — C787 Secondary malignant neoplasm of liver and intrahepatic bile duct: Secondary | ICD-10-CM | POA: Insufficient documentation

## 2021-01-24 DIAGNOSIS — C50511 Malignant neoplasm of lower-outer quadrant of right female breast: Secondary | ICD-10-CM | POA: Insufficient documentation

## 2021-01-24 DIAGNOSIS — Z5112 Encounter for antineoplastic immunotherapy: Secondary | ICD-10-CM | POA: Insufficient documentation

## 2021-01-24 DIAGNOSIS — Z170 Estrogen receptor positive status [ER+]: Secondary | ICD-10-CM

## 2021-01-24 LAB — COMPREHENSIVE METABOLIC PANEL
ALT: 15 U/L (ref 0–44)
AST: 21 U/L (ref 15–41)
Albumin: 4.2 g/dL (ref 3.5–5.0)
Alkaline Phosphatase: 94 U/L (ref 38–126)
Anion gap: 9 (ref 5–15)
BUN: 11 mg/dL (ref 6–20)
CO2: 29 mmol/L (ref 22–32)
Calcium: 9.2 mg/dL (ref 8.9–10.3)
Chloride: 101 mmol/L (ref 98–111)
Creatinine, Ser: 0.86 mg/dL (ref 0.44–1.00)
GFR, Estimated: >60 mL/min (ref >60)
Glucose, Bld: 78 mg/dL (ref 70–99)
Potassium: 3.8 mmol/L (ref 3.5–5.1)
Sodium: 139 mmol/L (ref 135–145)
Total Bilirubin: 0.6 mg/dL (ref 0.3–1.2)
Total Protein: 6.9 g/dL (ref 6.5–8.1)

## 2021-01-24 LAB — CBC WITH DIFFERENTIAL/PLATELET
Abs Immature Granulocytes: 0.01 10*3/uL (ref 0.00–0.07)
Basophils Absolute: 0.1 10*3/uL (ref 0.0–0.1)
Basophils Relative: 2 %
Eosinophils Absolute: 0.1 10*3/uL (ref 0.0–0.5)
Eosinophils Relative: 1 %
HCT: 39.5 % (ref 36.0–46.0)
Hemoglobin: 12.8 g/dL (ref 12.0–15.0)
Immature Granulocytes: 0 %
Lymphocytes Relative: 39 %
Lymphs Abs: 1.8 10*3/uL (ref 0.7–4.0)
MCH: 32 pg (ref 26.0–34.0)
MCHC: 32.4 g/dL (ref 30.0–36.0)
MCV: 98.8 fL (ref 80.0–100.0)
Monocytes Absolute: 0.5 10*3/uL (ref 0.1–1.0)
Monocytes Relative: 12 %
Neutro Abs: 2.1 10*3/uL (ref 1.7–7.7)
Neutrophils Relative %: 46 %
Platelets: 148 10*3/uL — ABNORMAL LOW (ref 150–400)
RBC: 4 MIL/uL (ref 3.87–5.11)
RDW: 12 % (ref 11.5–15.5)
WBC: 4.5 10*3/uL (ref 4.0–10.5)
nRBC: 0 % (ref 0.0–0.2)

## 2021-01-24 LAB — PROTIME-INR
INR: 3.2 — ABNORMAL HIGH (ref 0.8–1.2)
Prothrombin Time: 31.8 seconds — ABNORMAL HIGH (ref 11.4–15.2)

## 2021-01-24 MED ORDER — DIPHENHYDRAMINE HCL 25 MG PO CAPS
25.0000 mg | ORAL_CAPSULE | Freq: Once | ORAL | Status: AC
  Administered 2021-01-24: 25 mg via ORAL

## 2021-01-24 MED ORDER — ACETAMINOPHEN 325 MG PO TABS
ORAL_TABLET | ORAL | Status: AC
  Filled 2021-01-24: qty 2

## 2021-01-24 MED ORDER — SODIUM CHLORIDE 0.9 % IV SOLN
Freq: Once | INTRAVENOUS | Status: AC
  Filled 2021-01-24: qty 250

## 2021-01-24 MED ORDER — SODIUM CHLORIDE 0.9% FLUSH
10.0000 mL | INTRAVENOUS | Status: DC | PRN
  Administered 2021-01-24: 10 mL
  Filled 2021-01-24: qty 10

## 2021-01-24 MED ORDER — DIPHENHYDRAMINE HCL 25 MG PO CAPS
ORAL_CAPSULE | ORAL | Status: AC
  Filled 2021-01-24: qty 1

## 2021-01-24 MED ORDER — TRASTUZUMAB-ANNS CHEMO 150 MG IV SOLR
6.0000 mg/kg | Freq: Once | INTRAVENOUS | Status: AC
  Administered 2021-01-24: 315 mg via INTRAVENOUS
  Filled 2021-01-24: qty 15

## 2021-01-24 MED ORDER — HEPARIN SOD (PORK) LOCK FLUSH 100 UNIT/ML IV SOLN
500.0000 [IU] | Freq: Once | INTRAVENOUS | Status: AC | PRN
  Administered 2021-01-24: 500 [IU]
  Filled 2021-01-24: qty 5

## 2021-01-24 MED ORDER — ACETAMINOPHEN 325 MG PO TABS
650.0000 mg | ORAL_TABLET | Freq: Once | ORAL | Status: AC
  Administered 2021-01-24: 650 mg via ORAL

## 2021-01-24 NOTE — Patient Instructions (Signed)
[facility] Discharge Instructions for Patients Receiving Chemotherapy  Today you received the following chemotherapy agents trastuzumab.  To help prevent nausea and vomiting after your treatment, we encourage you to take your nausea medication as directed.    If you develop nausea and vomiting that is not controlled by your nausea medication, call the clinic.   BELOW ARE SYMPTOMS THAT SHOULD BE REPORTED IMMEDIATELY:  *FEVER GREATER THAN 100.5 F  *CHILLS WITH OR WITHOUT FEVER  NAUSEA AND VOMITING THAT IS NOT CONTROLLED WITH YOUR NAUSEA MEDICATION  *UNUSUAL SHORTNESS OF BREATH  *UNUSUAL BRUISING OR BLEEDING  TENDERNESS IN MOUTH AND THROAT WITH OR WITHOUT PRESENCE OF ULCERS  *URINARY PROBLEMS  *BOWEL PROBLEMS  UNUSUAL RASH Items with * indicate a potential emergency and should be followed up as soon as possible.  Feel free to call the clinic should you have any questions or concerns. The clinic phone number is [phone].  Please show the CHEMO ALERT CARD at check-in to the Emergency Department and triage nurse.   

## 2021-01-24 NOTE — Progress Notes (Signed)
Pt states no likelihood that she is pregnant.  No preg test needed w/ this cycle of Trastuzumab.  Kennith Center, Pharm.D., CPP 01/24/2021@9 :21 AM

## 2021-01-26 ENCOUNTER — Telehealth: Payer: Self-pay | Admitting: Oncology

## 2021-01-26 NOTE — Telephone Encounter (Signed)
Scheduled MD appt per 3/17 los. Adjusted other appts accordingly. Called patient with updated appts times. No answer. Left vm for pt with updated appts.

## 2021-01-27 ENCOUNTER — Ambulatory Visit
Admission: RE | Admit: 2021-01-27 | Discharge: 2021-01-27 | Disposition: A | Discharge status: Home / Self Care | Payer: 59 | Source: Ambulatory Visit | Attending: Oncology | Admitting: Oncology

## 2021-01-27 ENCOUNTER — Other Ambulatory Visit: Payer: 59

## 2021-01-27 ENCOUNTER — Other Ambulatory Visit: Payer: Self-pay

## 2021-01-27 DIAGNOSIS — C787 Secondary malignant neoplasm of liver and intrahepatic bile duct: Secondary | ICD-10-CM

## 2021-01-27 DIAGNOSIS — C50511 Malignant neoplasm of lower-outer quadrant of right female breast: Secondary | ICD-10-CM

## 2021-01-27 DIAGNOSIS — Z170 Estrogen receptor positive status [ER+]: Secondary | ICD-10-CM

## 2021-01-27 DIAGNOSIS — Z86711 Personal history of pulmonary embolism: Secondary | ICD-10-CM

## 2021-01-27 DIAGNOSIS — L930 Discoid lupus erythematosus: Secondary | ICD-10-CM

## 2021-01-27 IMAGING — MR MR ABDOMEN WO/W CM
12 of 17 series · 29 of 48 positions shown · IV contrast (multihance)
Comparison: [DATE]

CLINICAL DATA: Metastatic breast cancer, evaluate liver metastases
slip

EXAM:
MRI ABDOMEN WITHOUT AND WITH CONTRAST
TECHNIQUE: Multiplanar multisequence MR imaging of the abdomen was performed
both before and after the administration of intravenous contrast.
CONTRAST:  10mL MULTIHANCE GADOBENATE DIMEGLUMINE 529 MG/ML IV SOLN

[Series 3: T2 · coronal · 5.0mm · 1.56mm/px · 1 of 30 slices shown (1 of 3)]
[im 1/30]
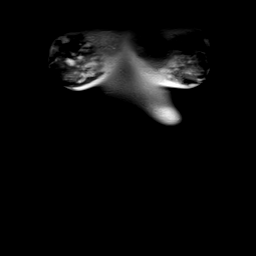

[Series 4: axial tru fisp · axial · 5.0mm · 1.33mm/px · 1 of 38 slices shown]
[im 1/38]
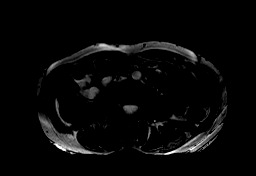

[Series 5: T2 · axial · 6.5mm · 0.66mm/px · 1 of 33 slices shown (2 of 3)]
[im 1/33]
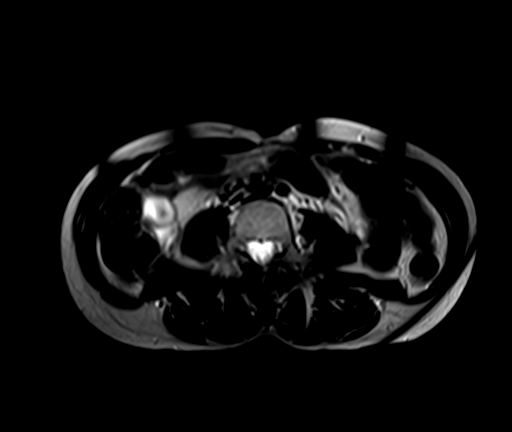

[Series 6: ep2d_[id number]_500_[id number] · axial · 6.5mm · 1.77mm/px · z∈[-88,+169]mm · 3 of 102 slices shown]
[im 1/102]
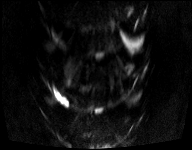
[im 51/102]
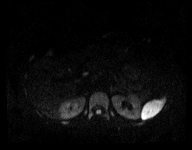
[im 102/102]
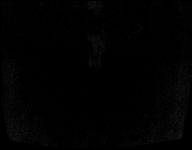

[Series 7: [id number]_[id number]_adc · axial · 6.5mm · 1.77mm/px · 1 of 34 slices shown]
[im 1/34]
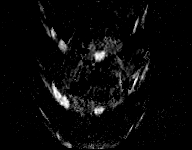

[Series 8: T2 · axial · 5.0mm · 1.33mm/px · 1 of 35 slices shown (3 of 3)]
[im 1/35]
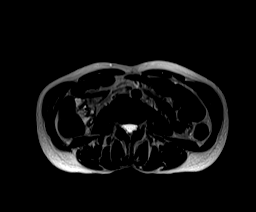

[Series 9: axial in out · axial · 5.5mm · 0.66mm/px · z∈[-71,+150]mm · 2 of 64 slices shown]
[im 1/64]
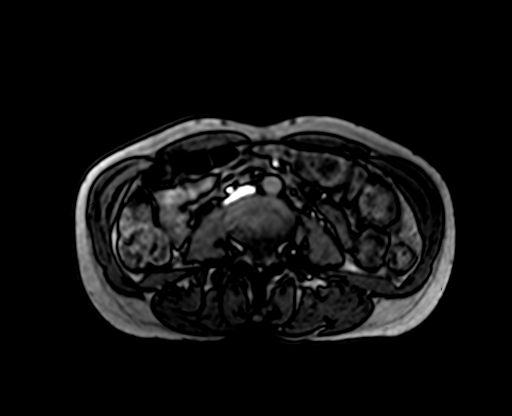
[im 64/64]
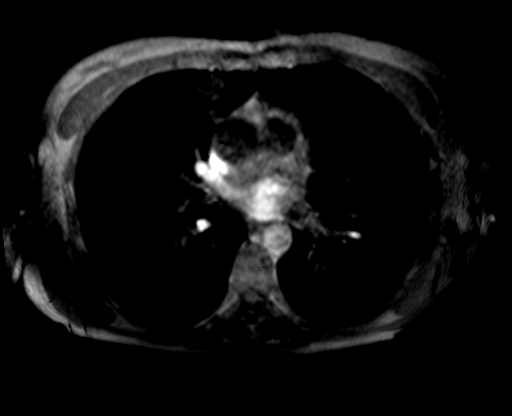

[Series 10: T1 dynamic · axial · non-contrast · 2.2mm · 0.66mm/px · z∈[-65,+144]mm · 4 of 96 slices shown]
[im 1/96]
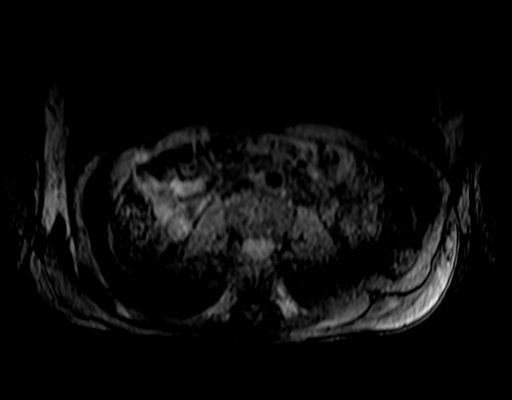
[im 32/96]
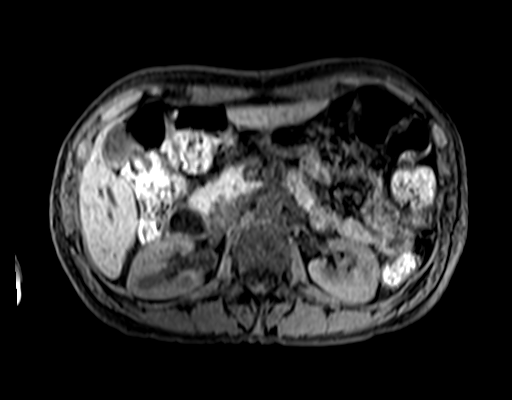
[im 64/96]
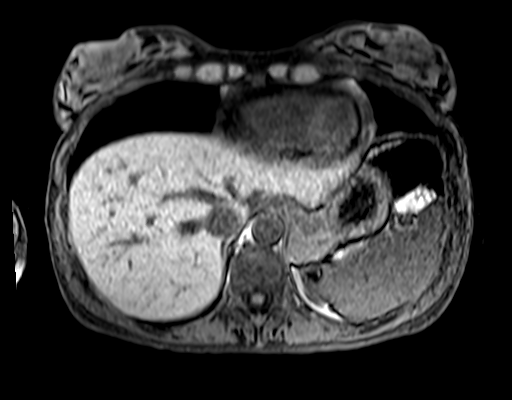
[im 96/96]
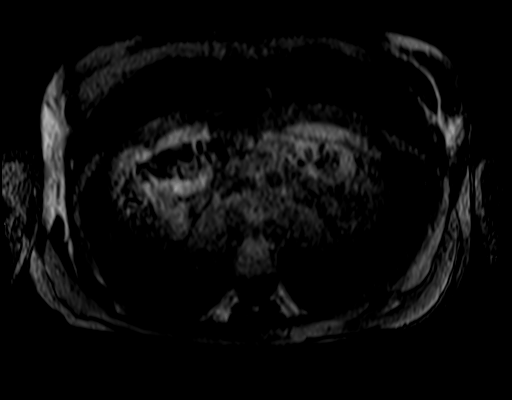

[Series 11: post 25 sec · axial · 2.2mm · 0.66mm/px · z∈[-65,+144]mm · 4 of 96 slices shown]
[im 1/96]
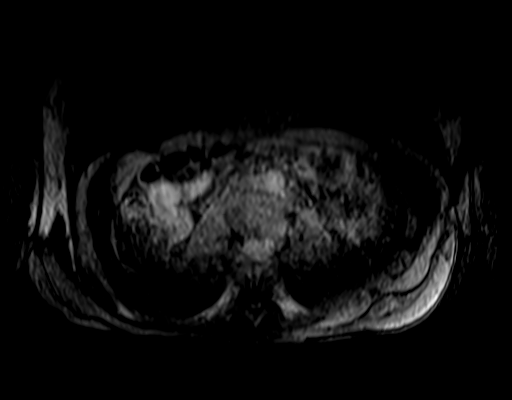
[im 32/96]
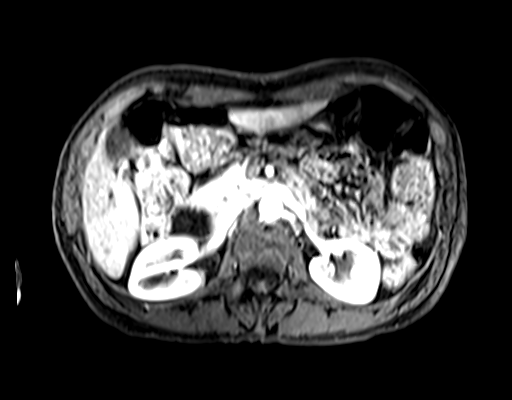
[im 64/96]
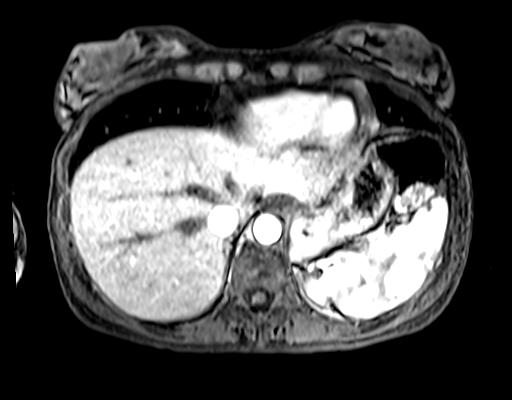
[im 96/96]
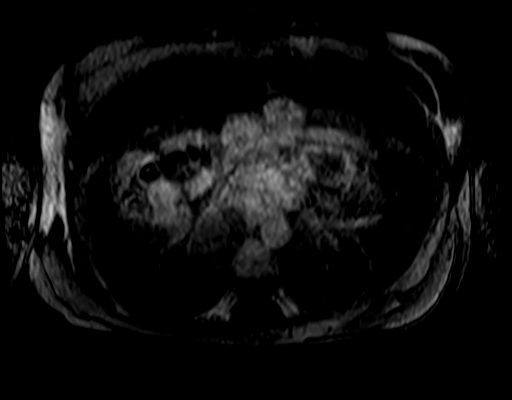

[Series 12: post 25 sec_sub · axial · 2.2mm · 0.66mm/px · z∈[-65,+144]mm · 3 of 91 slices shown]
[im 1/91]
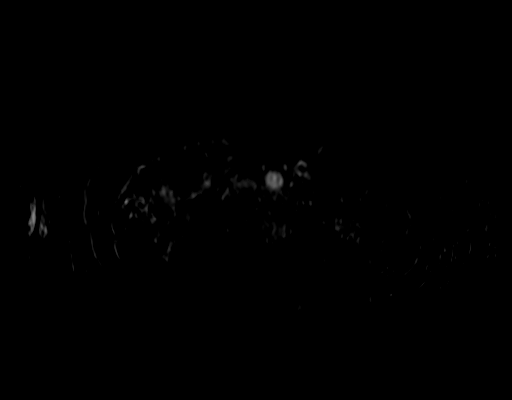
[im 46/91]
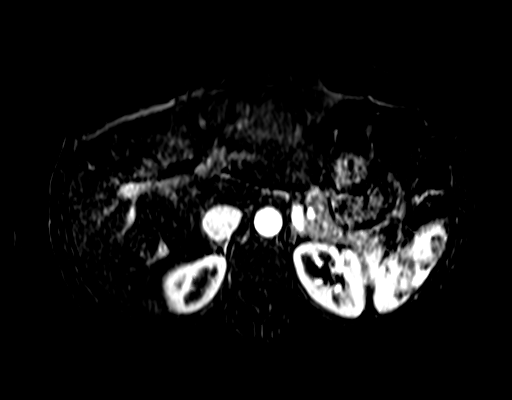
[im 91/91]
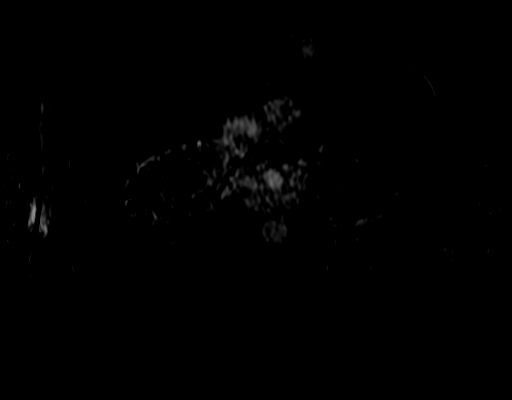

[Series 13: post 45 sec · axial · 2.2mm · 0.66mm/px · z∈[-65,+144]mm · 4 of 96 slices shown]
[im 1/96]
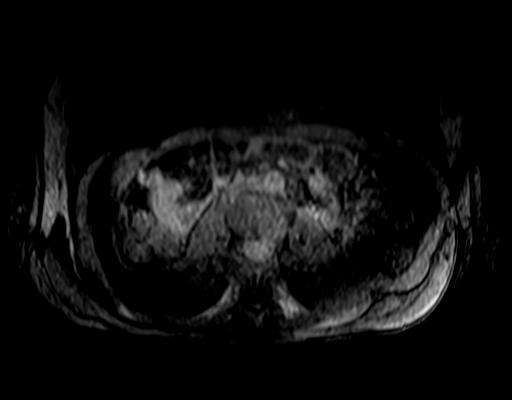
[im 32/96]
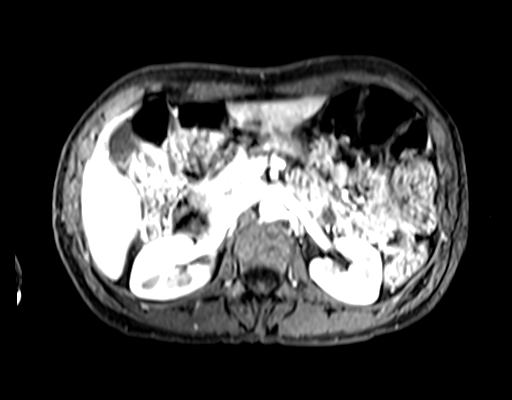
[im 64/96]
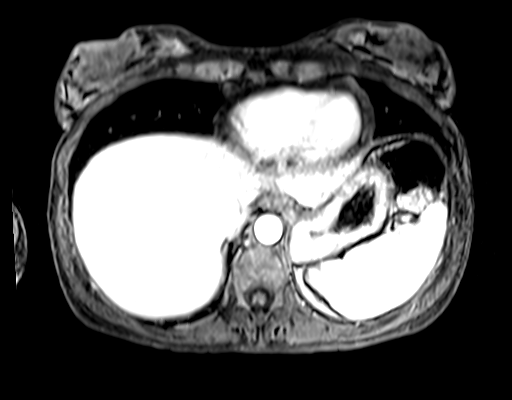
[im 96/96]
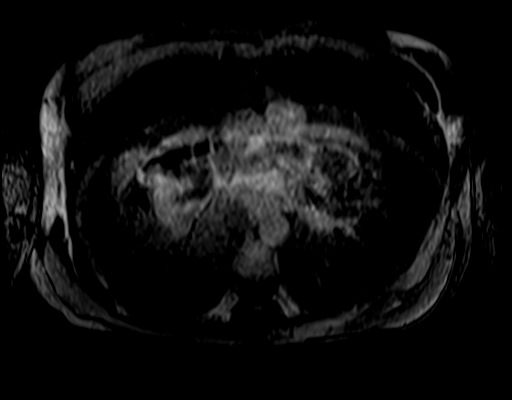

[Series 14: post 45 sec_sub · axial · 2.2mm · 0.66mm/px · z∈[-65,+144]mm · 4 of 96 slices shown]
[im 1/96]
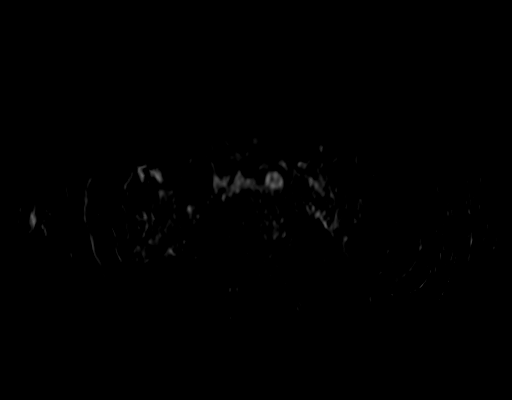
[im 32/96]
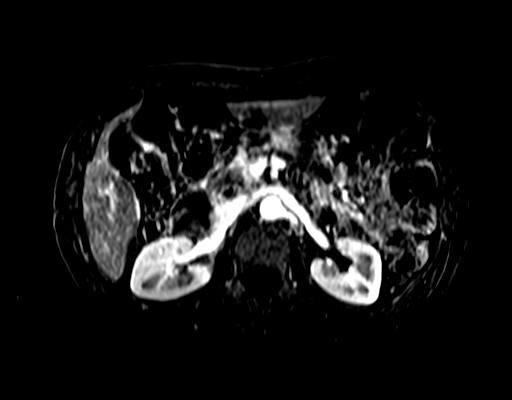
[im 64/96]
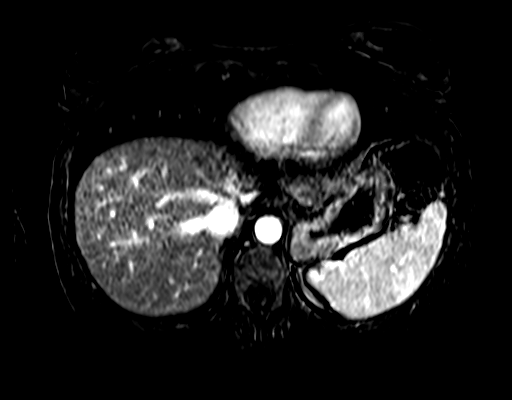
[im 96/96]
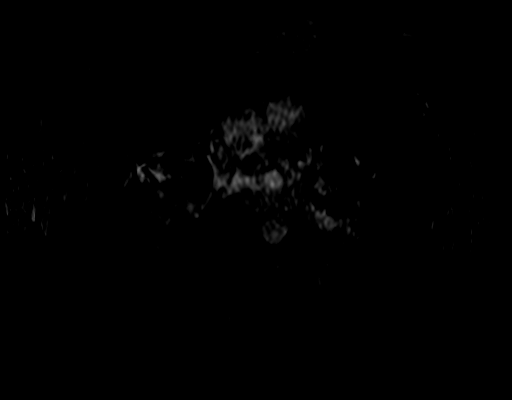

[29 of 48 positions shown; findings below may reference images not displayed]

FINDINGS: Lower chest: No acute findings.

Hepatobiliary: No mass or other parenchymal abnormality identified.
There are no residual contrast enhancing lesions identified in the
liver. There are very subtle, hypoenhancing remnants of the largest
previously hyperenhancing lesions identified, for example in the
anterior liver dome, hepatic segment VII (series 13, image 28).

Pancreas: No mass, inflammatory changes, or other parenchymal
abnormality identified.

Spleen:  Within normal limits in size and appearance.

Adrenals/Urinary Tract: No masses identified. No evidence of
hydronephrosis.

Stomach/Bowel: Visualized portions within the abdomen are
unremarkable.

Vascular/Lymphatic: No pathologically enlarged lymph nodes
identified. No abdominal aortic aneurysm demonstrated.

Other:  None.

Musculoskeletal: No suspicious bone lesions identified.
IMPRESSION: 1. There are no residual contrast enhancing lesions identified in
the liver. There are very subtle, hypoenhancing treated remnants of
the largest previously hyperenhancing lesions. Findings are
consistent with treatment response.
2. No other evidence of lymphadenopathy or metastatic disease in the
abdomen.

## 2021-01-27 MED ORDER — GADOBENATE DIMEGLUMINE 529 MG/ML IV SOLN
10.0000 mL | Freq: Once | INTRAVENOUS | Status: AC | PRN
  Administered 2021-01-27: 10 mL via INTRAVENOUS

## 2021-01-29 ENCOUNTER — Encounter: Payer: Self-pay | Admitting: Oncology

## 2021-01-30 ENCOUNTER — Ambulatory Visit: Payer: Self-pay

## 2021-01-30 ENCOUNTER — Other Ambulatory Visit: Payer: Self-pay

## 2021-01-30 ENCOUNTER — Encounter: Payer: Self-pay | Admitting: Family Medicine

## 2021-01-30 ENCOUNTER — Other Ambulatory Visit: Payer: Self-pay | Admitting: Oncology

## 2021-01-30 ENCOUNTER — Telehealth: Payer: Self-pay | Admitting: Family Medicine

## 2021-01-30 ENCOUNTER — Ambulatory Visit (INDEPENDENT_AMBULATORY_CARE_PROVIDER_SITE_OTHER): Payer: 59 | Admitting: Family Medicine

## 2021-01-30 DIAGNOSIS — R5383 Other fatigue: Secondary | ICD-10-CM

## 2021-01-30 DIAGNOSIS — M79671 Pain in right foot: Secondary | ICD-10-CM | POA: Diagnosis not present

## 2021-01-30 DIAGNOSIS — R635 Abnormal weight gain: Secondary | ICD-10-CM

## 2021-01-30 DIAGNOSIS — M1712 Unilateral primary osteoarthritis, left knee: Secondary | ICD-10-CM

## 2021-01-30 DIAGNOSIS — M1711 Unilateral primary osteoarthritis, right knee: Secondary | ICD-10-CM

## 2021-01-30 MED ORDER — TRIAMCINOLONE ACETONIDE 0.5 % EX OINT: TOPICAL_OINTMENT | CUTANEOUS | 3 refills | Status: DC

## 2021-01-30 MED ORDER — MUPIROCIN 2 % EX OINT: 1.0000 "application " | TOPICAL_OINTMENT | Freq: Two times a day (BID) | CUTANEOUS | 0 refills | Status: DC

## 2021-01-30 NOTE — Telephone Encounter (Signed)
Noted  

## 2021-01-30 NOTE — Progress Notes (Signed)
I called Erica Wood with the results of her MRI which shows excellent response in the liver.  At this point I think we can move to completion of local treatment.  I have alerted her surgeon Dr. Donne Hazel regarding this and let her know that she may expect a call from him.

## 2021-01-30 NOTE — Progress Notes (Signed)
Office Visit Note   Patient: Erica Wood           Date of Birth: [birth year]           MRN: [id number] Visit Date: 01/30/2021 Requested by: Eunice Blase, MD [address] [place],  [place] [address] PCP: Eunice Blase, MD  Subjective: Chief Complaint  Patient presents with  . Right Foot - Pain    Bruising in the top of the foot x 1&1/2 weeks now. Unsure of any injury. Before that, has been dealing with an infection in the big toe - went away and came back    HPI: She is here with right foot pain.  No injury, but about a week and a half ago she started noticing bruising on the dorsum of her foot extending into the toes.  It does not hurt when she walks, she has not noticed that she is walking with a limp.  It has been sore to the touch.  Around that time she had an infected great toenail but that seemed to heal, but now it is starting to come back.  She is on anticoagulants chronically.  She is also taking vitamin D.  She has not had a stress fracture of her foot.  She also notes that she has had deep aching pain in both of her thighs for the past couple months intermittently.  She is also having fatigue and unintended weight gain, would like to have thyroid function checked.  Both of her knees are hurting again.  She did well with gel injections.  We will request approval for more.  Overall she has done well from a breast cancer standpoint, she is responding to treatment and her most recent abdominal MRI scan did not show any metastatic disease in the liver so she is now able to proceed with breast surgery.              ROS:   All other systems were reviewed and are negative.  Objective: Vital Signs: There were no vitals taken for this visit.  Physical Exam:  General:  Alert and oriented, in no acute distress. Pulm:  Breathing unlabored. Psy:  Normal mood, congruent affect. Skin: There is resolving ecchymosis on the dorsum of her forefoot near the third and fourth MTP  joints.  No erythema around the great toe. Right foot: She is very tender to palpation of the distal third metatarsal.  Also moderately tender to palpation of the third and fourth toes.  Imaging: XR Foot Complete Right  Result Date: 01/30/2021 X-rays of the right foot reveal possible early cortical elevation of the medial side of the shaft of the third metatarsal bone.  No other abnormality seen.   Assessment & Plan: 1.  Right foot pain, suspicious for third metatarsal stress fracture -Activity modification for the next couple weeks until pain subsides.  If symptoms persist we will repeat x-rays.  2.  Ingrown right great toenail -Topical triamcinolone and Bactroban.  Oral antibiotics or possibly removal of ingrown nail if symptoms persist.  3.  Fatigue and unintended weight gain -We will check thyroid function.  4.  Bilateral knee osteoarthritis -We will request approval for gel injections.     Procedures: No procedures performed        PMFS History: Patient Active Problem List   Diagnosis Date Noted  . Liver metastases ([place]) 01/03/2021  . Port-A-Cath in place 11/02/2020  . Aortic atherosclerosis ([place]) 09/21/2020  . Malignant neoplasm of lower-outer quadrant of  right breast of female, estrogen receptor positive ([place]) 08/10/2020  . Abnormal cervical Papanicolaou smear 07/12/2020  . Pulmonary embolism ([place]) 07/12/2020  . Fibrocystic disease of breast 01/22/2018  . Primary osteoarthritis of both knees 05/02/2017  . Raynaud's disease without gangrene 11/26/2016  . 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 Anticardiolipin antibody positive 11/26/2016  . Autoimmune disease ([place]) 11/25/2016  . High risk medication use 11/25/2016  . Primary osteoarthritis of both hands 11/25/2016  . History of DVT (deep vein thrombosis) 11/25/2016  . History of pulmonary embolism 11/25/2016  . Primary insomnia 11/25/2016  . Anxiety 11/25/2016  . Irritable bowel syndrome with constipation 11/25/2016  .  Raynaud's phenomenon 01/03/2016  . Varicose veins of lower extremities with other complications 03/55/9741  . Spider veins of both lower extremities 05/24/2014   Past Medical History:  Diagnosis Date  . Anti-cardiolipin antibody positive   . Anti-cardiolipin antibody syndrome ([facility])   . Anxiety   . Arthralgia   . Arthritis    bil knees, hands  . Atypical chest pain   . Autoimmune disease ([place])   . Breast cancer metastasized to liver ([facility]) 09/2020  . Cancer ([place]) 07/2020   right breast IDC  . Chronic fatigue   . Depression   . DVT (deep venous thrombosis) ([place])   . IBS (irritable bowel syndrome)    with constipation  . Lupus ([place])   . Pulmonary embolus ([place])   . Raynaud's syndrome   . Sicca ([place])   . Thrombocytopenia ([place])     Family History  Problem Relation Age of Onset  . Rheum arthritis Father   . Hemachromatosis Father   . Cancer Sister        bone     Past Surgical History:  Procedure Laterality Date  . ABLATION  12/2018   leg veins  . ABLATION Right 08/2019   venous leg   . APPENDECTOMY    . COLON SURGERY     Perforated colon repair after colostomy  . GANGLION CYST EXCISION    . HYSTEROSCOPY WITH D & C N/A 01/13/2014   Procedure: DILATATION AND CURETTAGE /HYSTEROSCOPY with resection ;  Surgeon: Olga Millers, MD;  Location: [facility];  Service: Gynecology;  Laterality: N/A;  . LAPAROTOMY    . PORTACATH PLACEMENT N/A 09/13/2020   Procedure: INSERTION PORT-A-CATH WITH ULTRASOUND GUIDANCE;  Surgeon: Rolm Bookbinder, MD;  Location: [facility];  Service: General;  Laterality: N/A;   Social History   Occupational History  . Not on file  Tobacco Use  . Smoking status: Former Smoker    Packs/day: 0.50    Years: 5.00    Pack years: 2.50    Types: Cigarettes    Start date: 08/06/2006    Quit date: 08/07/2011    Years since quitting: 9.4  . Smokeless tobacco: Never Used  Vaping Use  . Vaping Use: Never used  Substance and Sexual Activity  .  Alcohol use: Yes    Comment: occasionally  . Drug use: No  . Sexual activity: Yes    Birth control/protection: Pill

## 2021-01-30 NOTE — Telephone Encounter (Signed)
Requesting approval for bilateral knee gel injections for OA.

## 2021-01-31 ENCOUNTER — Telehealth: Payer: Self-pay | Admitting: Family Medicine

## 2021-01-31 LAB — THYROID PEROXIDASE ANTIBODY: Thyroperoxidase Ab SerPl-aCnc: <8 IU/mL (ref 0–34)

## 2021-01-31 LAB — THYROID PANEL WITH TSH
Free Thyroxine Index: 1.9 (ref 1.2–4.9)
T3 Uptake Ratio: 31 % (ref 24–39)
T4, Total: 6 ug/dL (ref 4.5–12.0)
TSH: 1 u[IU]/mL (ref 0.450–4.500)

## 2021-01-31 NOTE — Telephone Encounter (Signed)
Thyroid numbers look perfect.

## 2021-02-06 ENCOUNTER — Encounter: Payer: Self-pay | Admitting: Gastroenterology

## 2021-02-06 ENCOUNTER — Other Ambulatory Visit: Payer: Self-pay

## 2021-02-06 ENCOUNTER — Ambulatory Visit (INDEPENDENT_AMBULATORY_CARE_PROVIDER_SITE_OTHER): Payer: 59 | Admitting: Gastroenterology

## 2021-02-06 VITALS — BP 110/72 | HR 74 | Ht 64.0 in | Wt 115.6 lb

## 2021-02-06 DIAGNOSIS — R140 Abdominal distension (gaseous): Secondary | ICD-10-CM

## 2021-02-06 DIAGNOSIS — K5909 Other constipation: Secondary | ICD-10-CM

## 2021-02-06 DIAGNOSIS — M359 Systemic involvement of connective tissue, unspecified: Secondary | ICD-10-CM

## 2021-02-06 DIAGNOSIS — Z1211 Encounter for screening for malignant neoplasm of colon: Secondary | ICD-10-CM | POA: Diagnosis not present

## 2021-02-06 DIAGNOSIS — Z79899 Other long term (current) drug therapy: Secondary | ICD-10-CM

## 2021-02-06 MED ORDER — TRULANCE 3 MG PO TABS: ORAL_TABLET | ORAL | 0 refills | Status: DC

## 2021-02-06 MED ORDER — AMPHETAMINE-DEXTROAMPHET ER 20 MG PO CP24: 20.0000 mg | ORAL_CAPSULE | Freq: Every day | ORAL | 0 refills | Status: DC

## 2021-02-06 NOTE — Patient Instructions (Signed)
If you are age 53 or older, your body mass index should be between 23-30. Your Body mass index is 19.84 kg/m. If this is out of the aforementioned range listed, please consider follow up with your Primary Care Provider.  If you are age 66 or younger, your body mass index should be between 19-25. Your Body mass index is 19.84 kg/m. If this is out of the aformentioned range listed, please consider follow up with your Primary Care Provider.   We have given you samples of the following medication to take: Trulance 3 mg: Take once daily as needed for constipation  Take a double dose (34 g) of Miralax Twice a day  Please call us in 2 to 3 weeks if you are not improving.  Thank you for entrusting me with your care and for choosing [facility], Dr. Clendenin Cellar

## 2021-02-06 NOTE — Progress Notes (Signed)
..  Patient assistance for Herceptin Hylecta from [facility] was denied on 02/01/2021, due to insurance is not covering because it requires patient to fail IV Infusion (alternatve therapy) first. Provider and RPh's notified.

## 2021-02-06 NOTE — Addendum Note (Signed)
Addended by: Shona Needles on: 02/06/2021 11:39 AM   Modules accepted: Orders

## 2021-02-06 NOTE — Telephone Encounter (Signed)
Next Visit: 05/29/2021  Last Visit: 12/28/2020  Last Fill: 01/05/2021 Dx:  Autoimmune disease   Current Dose per office note on 12/28/2020, not mentioned  Okay to refill Adderal?

## 2021-02-06 NOTE — Progress Notes (Signed)
HPI :  55 year old female with a history of metastatic breast cancer, diagnosed in September 2021, currently on chemotherapy, history of DVT on Coumadin, history of lupus, history of bowel perforation with colonoscopy in 2002, referred here by Eunice Blase MD for constipation.  The patient states she has had constipation for what seems like her entire life.  She can go anywhere from 5 days to 2 weeks without a bowel movement.  Things had worsened recently when she has been on chemotherapy for breast cancer in recent months.  Sometimes she has straining with stools, sometimes she does not.  She does have discomfort in her mid to lower abdominal pain associated with significant bloating if she does not have a bowel movement for several days.  Having a bowel movement relieves her symptoms significantly.  She does not see any blood in her stool.  She recalls being on mineral oil as a child for this.  She has been on a variety of regimens to include MiraLAX once daily, mag citrate as needed, Dulcolax as needed, Linzess, Reglan.  She states none of these have really worked too well for her.  She particularly did not like Linzess, caused a lot of side effects.  She has not been on any other constipation regimens other than outlined.  She is been on Coumadin for blood clot since 2003.  She had a colonoscopy for bowel changes in 2002, this was unfortunately complicated by a perforation that led to surgery to repair laceration.  She states she never wants a colonoscopy again.  Fortunately it appears she is responding well to chemotherapy for her breast cancer.  She has known metastatic disease to the liver which appeared improved on last imaging.  She has had multiple imaging studies over the year which several have shown constipated state with stool throughout her colon.   Echocardiogram 01/10/21 - EF 60-65%  MRI abdomen 01/29/21 - IMPRESSION: 1. There are no residual contrast enhancing lesions identified  in the liver. There are very subtle, hypoenhancing treated remnants of the largest previously hyperenhancing lesions. Findings are consistent with treatment response. 2. No other evidence of lymphadenopathy or metastatic disease in the abdomen.  Past Medical History:  Diagnosis Date  . Anti-cardiolipin antibody positive   . Anti-cardiolipin antibody syndrome (HCC)   . Anxiety   . Arthralgia   . Arthritis    bil knees, hands  . Atypical chest pain   . Autoimmune disease ([place])   . Breast cancer metastasized to liver ([place]) 09/2020  . Cancer ([place]) 07/2020   right breast IDC  . Chronic fatigue   . Depression   . DVT (deep venous thrombosis) ([place])   . IBS (irritable bowel syndrome)    with constipation  . Lupus ([place])   . Pulmonary embolus ([place])   . Raynaud's syndrome   . Sicca ([place])   . Thrombocytopenia ([place])      Past Surgical History:  Procedure Laterality Date  . ABLATION  12/2018   leg veins  . ABLATION Right 08/2019   venous leg   . APPENDECTOMY    . COLON SURGERY     Perforated colon repair after colostomy  . GANGLION CYST EXCISION    . HYSTEROSCOPY WITH D & C N/A 01/13/2014   Procedure: DILATATION AND CURETTAGE /HYSTEROSCOPY with resection ;  Surgeon: Olga Millers, MD;  Location: [facility];  Service: Gynecology;  Laterality: N/A;  . LAPAROTOMY    . PORTACATH PLACEMENT N/A 09/13/2020   Procedure: INSERTION  PORT-A-CATH WITH ULTRASOUND GUIDANCE;  Surgeon: Rolm Bookbinder, MD;  Location: [facility];  Service: General;  Laterality: N/A;   Family History  Problem Relation Age of Onset  . Rheum arthritis Father   . Hemachromatosis Father   . Cancer Sister        bone   . Colon cancer Neg Hx   . Stomach cancer Neg Hx   . Esophageal cancer Neg Hx   . Pancreatic cancer Neg Hx   . Liver disease Neg Hx    Social History   Tobacco Use  . Smoking status: Former Smoker    Packs/day: 0.50    Years: 5.00    Pack years: 2.50    Types: Cigarettes     Start date: 08/06/2006    Quit date: 08/07/2011    Years since quitting: 9.5  . Smokeless tobacco: Never Used  Vaping Use  . Vaping Use: Never used  Substance Use Topics  . Alcohol use: Yes    Comment: occasionally  . Drug use: No   Current Outpatient Medications  Medication Sig Dispense Refill  . acetaminophen (TYLENOL) 500 MG tablet Take 1,000 mg by mouth every 6 (six) hours as needed for moderate pain or headache.    . amphetamine-dextroamphetamine (ADDERALL XR) 20 MG 24 hr capsule Take 1 capsule (20 mg total) by mouth daily. 30 capsule 0  . anastrozole (ARIMIDEX) 1 MG tablet Take 1 tablet (1 mg total) by mouth daily. 90 tablet 4  . Ascorbic Acid (VITAMIN C PO) Take 3,000 mg by mouth daily.     Marland Kitchen CAMILA 0.35 MG tablet Take 1 tablet by mouth daily.  11  . Cholecalciferol (VITAMIN D3) 75 MCG (3000 UT) TABS Take 3,000 Units by mouth daily.    . cycloSPORINE (RESTASIS) 0.05 % ophthalmic emulsion Place 1 drop into both eyes 2 (two) times daily. 5.5 mL 1  . Docusate Sodium (COLACE PO) Take 2 tablets by mouth 2 (two) times daily.    . Fluocinonide 0.1 % CREA Apply 1 application topically 2 (two) times daily. 240 g 2  . hydroxychloroquine (PLAQUENIL) 200 MG tablet TAKE 1 TABLET BY MOUTH TWICE DAILY MONDAY THROUGH FRIDAY 120 tablet 0  . loratadine (CLARITIN) 10 MG tablet Take 10 mg by mouth See admin instructions. Take 10 mg daily for 5 days after chemo treatments    . magic mouthwash SOLN Take 5 mLs by mouth 4 (four) times daily as needed for mouth pain. 240 mL 0  . metoCLOPramide (REGLAN) 10 MG tablet Take 1 tablet (10 mg total) by mouth every 6 (six) hours as needed for nausea. 40 tablet 2  . metroNIDAZOLE (METROGEL) 1 % gel Apply topically daily. 45 g 0  . mupirocin ointment (BACTROBAN) 2 % Apply 1 application topically 2 (two) times daily. 22 g 0  . oxyCODONE (OXY IR/ROXICODONE) 5 MG immediate release tablet Take 1 tablet (5 mg total) by mouth every 6 (six) hours as needed. 6 tablet 0  .  palbociclib (IBRANCE) 125 MG tablet Take 1 tablet (125 mg total) by mouth daily. Take for 21 days on, 7 days off, repeat every 28 days. 21 tablet 6  . Pegfilgrastim (NEULASTA [place]) Inject 1 Dose into the skin See admin instructions. Inject 24 to 72 hours after chemo treatment    . polyethylene glycol (MIRALAX / GLYCOLAX) 17 g packet Take 17 g by mouth daily.    Marland Kitchen spironolactone (ALDACTONE) 25 MG tablet Take 25 mg by mouth daily as needed (  swelling).    . traMADol (ULTRAM) 50 MG tablet Take 1-2 tablets (50-100 mg total) by mouth every 6 (six) hours as needed. (Patient taking differently: Take 50-100 mg by mouth every 6 (six) hours as needed for moderate pain.) 60 tablet 0  . tretinoin (RETIN-A) 0.1 % cream Apply 1 application topically at bedtime as needed (acne).     . triamcinolone lotion (KENALOG) 0.1 % Apply 1 application topically 3 (three) times daily. 120 mL 2  . triamcinolone ointment (KENALOG) 0.5 % Apply to affected toe BID prn 30 g 3  . triamterene-hydrochlorothiazide (DYAZIDE) 37.5-25 MG capsule TAKE 1 CAPSULE BY MOUTH DAILY AS NEEDED FOR SWELLING 90 capsule 0  . UNABLE TO FIND Green Vibrance - 1 scoop daily    . warfarin (COUMADIN) 5 MG tablet Take 1 tablet (5 mg total) by mouth daily. 30 tablet 11   No current facility-administered medications for this visit.   Allergies  Allergen Reactions  . Beef-Derived Products Hives  . Latex Rash    Contact  . Sulfa Antibiotics Rash     Review of Systems: All systems reviewed and negative except where noted in HPI.    MR Abdomen W Wo Contrast  Result Date: 01/29/2021 CLINICAL DATA:  Metastatic breast cancer, evaluate liver metastases slip EXAM: MRI ABDOMEN WITHOUT AND WITH CONTRAST TECHNIQUE: Multiplanar multisequence MR imaging of the abdomen was performed both before and after the administration of intravenous contrast. CONTRAST:  3mL MULTIHANCE GADOBENATE DIMEGLUMINE 529 MG/ML IV SOLN COMPARISON:  11/06/2020 FINDINGS: Lower chest: No  acute findings. Hepatobiliary: No mass or other parenchymal abnormality identified. There are no residual contrast enhancing lesions identified in the liver. There are very subtle, hypoenhancing remnants of the largest previously hyperenhancing lesions identified, for example in the anterior liver dome, hepatic segment VII (series 13, image 28). Pancreas: No mass, inflammatory changes, or other parenchymal abnormality identified. Spleen:  Within normal limits in size and appearance. Adrenals/Urinary Tract: No masses identified. No evidence of hydronephrosis. Stomach/Bowel: Visualized portions within the abdomen are unremarkable. Vascular/Lymphatic: No pathologically enlarged lymph nodes identified. No abdominal aortic aneurysm demonstrated. Other:  None. Musculoskeletal: No suspicious bone lesions identified. IMPRESSION: 1. There are no residual contrast enhancing lesions identified in the liver. There are very subtle, hypoenhancing treated remnants of the largest previously hyperenhancing lesions. Findings are consistent with treatment response. 2. No other evidence of lymphadenopathy or metastatic disease in the abdomen. Electronically Signed   By: Eddie Candle M.D.   On: 01/29/2021 07:43   ECHOCARDIOGRAM COMPLETE  Result Date: 01/10/2021    ECHOCARDIOGRAM REPORT   Patient Name:   MODESTY RUDY [facility] Date of Exam: 01/10/2021 Medical Rec #:  [id number]            Height:       64.0 in Accession #:    [id number]           Weight:       116.1 lb Date of Birth:  12-15-1965            BSA:          1.553 m Patient Age:    79 years             BP:           107/70 mmHg Patient Gender: F                    HR:           78 bpm. Exam  Location:  Outpatient Procedure: 2D Echo, Cardiac Doppler, Color Doppler and Strain Analysis                                 MODIFIED REPORT:      This report was modified by Rudean Haskell MD on 01/10/2021 due to                            Comparison to prior study.  Indications:      Chemo evaluation  History:         Patient has prior history of Echocardiogram examinations, most                  recent 09/25/2020. Breast cancer.  Sonographer:     Dustin Flock Referring Phys:  [id number] Chauncey Cruel Diagnosing Phys: Rudean Haskell MD IMPRESSIONS  1. Left ventricular ejection fraction, by estimation, is 60 to 65%. The left ventricle has normal function. The left ventricle has no regional wall motion abnormalities. Left ventricular diastolic parameters were normal. Left ventricular strain pattern is similar and similar loading conditions: -22% in the A2C most respresentative finding.  2. Right ventricular systolic function is normal. The right ventricular size is normal. There is normal pulmonary artery systolic pressure.  3. The mitral valve is grossly normal. Trivial mitral valve regurgitation.  4. The aortic valve is tricuspid. Aortic valve regurgitation is mild. No aortic stenosis is present.  5. The inferior vena cava is normal in size with greater than 50% respiratory variability, suggesting right atrial pressure of 3 mmHg. Comparison(s): A prior study was performed on 09/25/2020. No significant change from prior study. Prior images reviewed side by side. FINDINGS  Left Ventricle: Left ventricular ejection fraction, by estimation, is 60 to 65%. The left ventricle has normal function. The left ventricle has no regional wall motion abnormalities. The left ventricular internal cavity size was normal in size. There is  no left ventricular hypertrophy. Left ventricular diastolic parameters were normal. Right Ventricle: The right ventricular size is normal. No increase in right ventricular wall thickness. Right ventricular systolic function is normal. There is normal pulmonary artery systolic pressure. The tricuspid regurgitant velocity is 2.40 m/s, and  with an assumed right atrial pressure of 3 mmHg, the estimated right ventricular systolic pressure is 83.3 mmHg. Left Atrium: Left  atrial size was normal in size. Right Atrium: Right atrial size was normal in size. Pericardium: There is no evidence of pericardial effusion. Mitral Valve: The mitral valve is grossly normal. Trivial mitral valve regurgitation. Tricuspid Valve: The tricuspid valve is normal in structure. Tricuspid valve regurgitation is not demonstrated. No evidence of tricuspid stenosis. Aortic Valve: The aortic valve is tricuspid. Aortic valve regurgitation is mild. Aortic regurgitation PHT measures 674 msec. No aortic stenosis is present. Pulmonic Valve: The pulmonic valve was grossly normal. Pulmonic valve regurgitation is trivial. No evidence of pulmonic stenosis. Aorta: The aortic root is normal in size and structure. Venous: The inferior vena cava is normal in size with greater than 50% respiratory variability, suggesting right atrial pressure of 3 mmHg. IAS/Shunts: The atrial septum is grossly normal.  LEFT VENTRICLE PLAX 2D LVIDd:         4.90 cm  Diastology LVIDs:         3.40 cm  LV e' medial:    7.83 cm/s LV PW:  0.80 cm  LV E/e' medial:  8.0 LV IVS:        0.70 cm  LV e' lateral:   12.30 cm/s LVOT diam:     1.90 cm  LV E/e' lateral: 5.1 LV SV:         67 LV SV Index:   43 LVOT Area:     2.84 cm  RIGHT VENTRICLE RV Basal diam:  3.10 cm RV S prime:     11.90 cm/s TAPSE (M-mode): 2.3 cm LEFT ATRIUM             Index       RIGHT ATRIUM           Index LA diam:        3.40 cm 2.19 cm/m  RA Area:     12.80 cm LA Vol (A2C):   37.1 ml 23.89 ml/m RA Volume:   30.90 ml  19.90 ml/m LA Vol (A4C):   26.5 ml 17.07 ml/m LA Biplane Vol: 32.1 ml 20.67 ml/m  AORTIC VALVE LVOT Vmax:   117.00 cm/s LVOT Vmean:  79.700 cm/s LVOT VTI:    0.238 m AI PHT:      674 msec  AORTA Ao Root diam: 2.90 cm MITRAL VALVE               TRICUSPID VALVE MV Area (PHT): 3.42 cm    TR Peak grad:   23.0 mmHg MV Decel Time: 222 msec    TR Vmax:        240.00 cm/s MV E velocity: 63.00 cm/s MV A velocity: 54.40 cm/s  SHUNTS MV E/A ratio:  1.16         Systemic VTI:  0.24 m                            Systemic Diam: 1.90 cm Rudean Haskell MD Electronically signed by Rudean Haskell MD Signature Date/Time: 01/10/2021/1:44:06 PM    Final (Updated)    XR Foot Complete Right  Result Date: 01/30/2021 X-rays of the right foot reveal possible early cortical elevation of the medial side of the shaft of the third metatarsal bone.  No other abnormality seen.  Lab Results  Component Value Date   WBC 4.5 01/24/2021   HGB 12.8 01/24/2021   HCT 39.5 01/24/2021   MCV 98.8 01/24/2021   PLT 148 (L) 01/24/2021    Lab Results  Component Value Date   CREATININE 0.86 01/24/2021   BUN 11 01/24/2021   NA 139 01/24/2021   K 3.8 01/24/2021   CL 101 01/24/2021   CO2 29 01/24/2021    Lab Results  Component Value Date   ALT 15 01/24/2021   AST 21 01/24/2021   ALKPHOS 94 01/24/2021   BILITOT 0.6 01/24/2021      Physical Exam: BP 110/72   Pulse 74   Ht 5\' 4"  (1.626 m)   Wt 115 lb 9.6 oz (52.4 kg)   SpO2 99%   BMI 19.84 kg/m  Constitutional: Pleasant,well-developed, female in no acute distress. Cardiovascular: Normal rate, regular rhythm.  Pulmonary/chest: Effort normal and breath sounds normal. No wheezing, rales or rhonchi. Abdominal: Soft, nondistended, nontender.  There are no masses palpable. No hepatomegaly. DRE - no mass lesions noted - CMA Ammie Eversole - normal contraction, seemed to have normal decent Extremities: no edema Lymphadenopathy: No cervical adenopathy noted. Neurological: Alert and oriented to person place and time. Skin: Skin is warm and  dry. No rashes noted. Psychiatric: Normal mood and affect. Behavior is normal.   ASSESSMENT AND PLAN: 55 year old female here for new patient assessment the following:  Chronic constipation / Abdominal bloating - as above, longstanding symptoms, has been on a variety of regimens, none of which have worked very well for her.  She has fairly infrequent bowel movements  associated with abdominal bloating and abdominal distention, reliably relieved with a bowel movement.  Symptoms have worsened since she has been on chemotherapy for breast cancer.  Discussed options with her.  DRE shows no evidence of pelvic floor dysfunction, discussed that she likely has slow transit constipation.  We discussed options for that.  She has only been on MiraLAX dosed at once daily.  We will increase this to double dose twice daily to start and titrate up or down as needed.  I also provided her a sample of Trulance to use as needed if high-dose MiraLAX in itself is not successful.  She can also use Dulcolax as needed which she takes when she does not go at baseline.  We will see how she does over the next few weeks, if no improvement she should contact me know we can reassess her regimen.  Colon cancer screening - as above patient has had a prior bowel perforation and is quite anxious about any further colonoscopy exams.  Also in light of her malignancy treatment, now is not a good time to have colon cancer screening. In general she is not interested in pursuing further colonoscopy in light of her history of bowel injury in the past, will await her course  Stonefort Cellar, MD [facility]

## 2021-02-06 NOTE — Telephone Encounter (Signed)
(  1) Patient called requesting prescription refill of Adderall to be sent to [facility].  (2) Patient states she had labwork at the hospital and they didn't do Urinalysis.  Patient states she is going to [place] on [address] this afternoon to have that test and requesting to stop by the office to pick up her labwork order to bring with her.  Patient states the last time she went they were unable to see the orders in Epic.

## 2021-02-08 ENCOUNTER — Other Ambulatory Visit: Payer: Self-pay | Admitting: Oncology

## 2021-02-12 ENCOUNTER — Other Ambulatory Visit: Payer: Self-pay | Admitting: Oncology

## 2021-02-12 ENCOUNTER — Telehealth: Payer: Self-pay

## 2021-02-12 ENCOUNTER — Telehealth: Payer: Self-pay | Admitting: Oncology

## 2021-02-12 ENCOUNTER — Encounter: Payer: Self-pay | Admitting: Family Medicine

## 2021-02-12 NOTE — Telephone Encounter (Signed)
No problem.

## 2021-02-12 NOTE — Telephone Encounter (Signed)
Rescheduled per 4/4 phone call from pt. 4/29 appts confirmed

## 2021-02-12 NOTE — Telephone Encounter (Signed)
VOB has been submitted for Eulfexxa, bilateral knee.

## 2021-02-13 ENCOUNTER — Other Ambulatory Visit: Payer: Self-pay

## 2021-02-13 ENCOUNTER — Telehealth: Payer: Self-pay

## 2021-02-13 LAB — URINALYSIS, ROUTINE W REFLEX MICROSCOPIC
Bilirubin, UA: NEGATIVE
Glucose, UA: NEGATIVE
Ketones, UA: NEGATIVE
Leukocytes,UA: NEGATIVE
Nitrite, UA: NEGATIVE
Protein,UA: NEGATIVE
RBC, UA: NEGATIVE
Specific Gravity, UA: 1.008 (ref 1.005–1.030)
Urobilinogen, Ur: 0.2 mg/dL (ref 0.2–1.0)
pH, UA: 7.5 (ref 5.0–7.5)

## 2021-02-13 MED ORDER — RESTASIS MULTIDOSE 0.05 % OP EMUL: 1.0000 [drp] | Freq: Two times a day (BID) | OPHTHALMIC | 5 refills | Status: DC

## 2021-02-13 NOTE — Telephone Encounter (Signed)
Called and left a VM advising to give me a call concerning Euflexxa injection for bilateral knee.

## 2021-02-13 NOTE — Telephone Encounter (Signed)
Contacted pt to let her know that I had done the PA for her restasis and it would be filled and mailed to her according to [employer]. Pt verbalized understanding.

## 2021-02-13 NOTE — Progress Notes (Signed)
Erica Wood  [phone] [phone]     ID: Erica Wood DOB: [birth year]  MR#: [id number]  IRS#:[id number]  Patient Care Team: Eunice Blase, MD as PCP - General (Family Medicine) Prudence Davidson, Riverton Bing, MD as Referring Physician (Oncology) Rockwell Germany, RN as Oncology Nurse Navigator Mauro Kaufmann, RN as Oncology Nurse Navigator Rolm Bookbinder, MD as Consulting Physician (General Surgery) Placido Hangartner, Virgie Dad, MD as Consulting Physician (Oncology) Kyung Rudd, MD as Consulting Physician (Radiation Oncology) Bo Merino, MD as Consulting Physician (Rheumatology) Olga Millers, MD as Consulting Physician (Obstetrics and Gynecology) Larey Dresser, MD as Consulting Physician (Cardiology) Armbruster, Carlota Raspberry, MD as Consulting Physician (Gastroenterology) Chauncey Cruel, MD OTHER MD:  CHIEF COMPLAINT: triple positive stage IV breast cancer  CURRENT TREATMENT: herceptin; anastrozole, palbociclib   INTERVAL HISTORY: Erica Wood returns today for follow up and treatment of her triple positive breast cancer.   She continues on trastuzumab indefinitely, repeated every 21 days.  She is interested in trying the subcutaneous form but we have not been able to get approval from insurance to give that a try--she would have to "fail" 2 or 3 bio similar so before they would approve it.  At some point however the subcutaneous version will become generic and likely they will approve it at that time.  She started anastrozole and palbociclib on 01/29/2021.  She is tolerating the anastrozole with occasional hot flashes and occasional chills.  They are continuing vaginal dryness issues and she is scheduled to participate in our pelvic physical therapy program beginning 03/13/2021.  She has had no worsening fatigue and no nausea from the palbociclib.  Her most recent echocardiogram on 01/10/2021 showed an ejection fraction of 60-65%.  Since her last  visit, she underwent abdomen MRI on 01/27/2021 showing: no residual contrast-enhancing lesions in liver; subtle, hypoenhancing treated remnants of largest previously hyperenhancing lesions; no other evidence of lymphadenopathy or metastatic disease.  Lab Results  Component Value Date   CA2729 91.9 (H) 01/03/2021    REVIEW OF SYSTEMS: Erica Wood has quite a bit of muscle and joint pain likely related to her lupus.  She is on Plaquenil, 5 days a week, off on weekends.  She is tolerating warfarin with no unusual bleeding.  2 weeks ago she says she had an episode of dizziness slight nausea and her knees almost giving way.  This lasted a couple of minutes.  She was not aware of palpitations.  She did not pass out or fall.  She tells me she had felt bad all that day.  She did eat and drink some.  That has not recurred.  She is now taking MiraLAX twice daily and the constipation she says is a bit better.  She has gained about 6 pounds and would like to lose them.  A detailed review of systems today was otherwise stable.   COVID 19 VACCINATION STATUS: Status post Pfizer x1   HISTORY OF CURRENT ILLNESS: From the original intake note:  Erica Wood herself palpated an abnormality in the right breast several years ago.  This had not changed until sometime in June 2021.  She was evaluated by Dr. Ouida Sills and underwent right diagnostic mammography with tomography and right breast ultrasonography at [facility] on 07/26/2020 showing: breast density category D; 2.6 cm mass in right breast at 6 o'clock, at site of palpable concern; two focal groups of punctate calcifications posterior to palpable mass; no right axillary adenopathy.  Accordingly on 08/04/2020 she proceeded to  biopsy of the right breast area in question. The pathology from this procedure ([id number]) showed: invasive ductal carcinoma, grade 3. Prognostic indicators significant for: estrogen receptor, 95% positive with moderate staining intensity and  progesterone receptor, 10% positive with strong staining intensity. Proliferation marker Ki67 at 40%. HER2 equivocal by immunohistochemistry (2+), but positive by fluorescent in situ hybridization with a signals ratio 3.23 and number per cell 6.45.  Biopsy of the posterior calcifications was benign.  She also underwent breast MRI on 08/11/2020 showing: breast composition D; 2.6 cm biopsy-proven malignancy in right breast at 6 o'clock, with probable skin involvement noted on previous ultrasound; additional enhancing masses within right breast-- 5 mm in lower-outer, 1.2 cm in upper-inner, 7 mm in upper-outer; no evidence of left breast malignancy or lymphadenopathy; heterogeneous enhancement of sternum, of uncertain significance.  The patient's subsequent history is as detailed below.   PAST MEDICAL HISTORY: Past Medical History:  Diagnosis Date  . Anti-cardiolipin antibody positive   . Anti-cardiolipin antibody syndrome (HCC)   . Anxiety   . Arthralgia   . Arthritis    bil knees, hands  . Atypical chest pain   . Autoimmune disease ([place])   . Breast cancer metastasized to liver ([place]) 09/2020  . Cancer ([place]) 07/2020   right breast IDC  . Chronic fatigue   . Depression   . DVT (deep venous thrombosis) ([place])   . IBS (irritable bowel syndrome)    with constipation  . Lupus ([place])   . Pulmonary embolus ([place])   . Raynaud's syndrome   . Sicca ([place])   . Thrombocytopenia ([place])     PAST SURGICAL HISTORY: Past Surgical History:  Procedure Laterality Date  . ABLATION  12/2018   leg veins  . ABLATION Right 08/2019   venous leg   . APPENDECTOMY    . COLON SURGERY     Perforated colon repair after colostomy  . GANGLION CYST EXCISION    . HYSTEROSCOPY WITH D & C N/A 01/13/2014   Procedure: DILATATION AND CURETTAGE /HYSTEROSCOPY with resection ;  Surgeon: Olga Millers, MD;  Location: [facility];  Service: Gynecology;  Laterality: N/A;  . LAPAROTOMY    . PORTACATH PLACEMENT N/A 09/13/2020    Procedure: INSERTION PORT-A-CATH WITH ULTRASOUND GUIDANCE;  Surgeon: Rolm Bookbinder, MD;  Location: [facility];  Service: General;  Laterality: N/A;    FAMILY HISTORY: Family History  Problem Relation Age of Onset  . Rheum arthritis Father   . Hemachromatosis Father   . Cancer Sister        bone   . Colon cancer Neg Hx   . Stomach cancer Neg Hx   . Esophageal cancer Neg Hx   . Pancreatic cancer Neg Hx   . Liver disease Neg Hx   as of October 2021 her father isage 8 and her mother age 33.. The patient had one sister (and no brothers). The sister was diagnosed with Ewing's sarcoma at age 51, and died from the disease after many difficult treatments.    GYNECOLOGIC HISTORY:  Last menstrual period occurred about 6 months prior to the start of chemotherapy Menarche: 83 or 55 years old GX P 0 LMP around 2019 Contraceptive: yes HRT no  Hysterectomy? no BSO? 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 She is widowed. She lives at home with her cat, Mariea Clonts. She is not a Ambulance person.    ADVANCED DIRECTIVES: not in place; at the 08/16/2020  visit the patient was given the appropriate documents to complete and notarized at her discretion.  She tells me she intends to name her significant other fill as her healthcare power of attorney   HEALTH MAINTENANCE: Social History   Tobacco Use  . Smoking status: Former Smoker    Packs/day: 0.50    Years: 5.00    Pack years: 2.50    Types: Cigarettes    Start date: 08/06/2006    Quit date: 08/07/2011    Years since quitting: 9.5  . Smokeless tobacco: Never Used  Vaping Use  . Vaping Use: Never used  Substance Use Topics  . Alcohol use: Yes    Comment: occasionally  . Drug use: No     Colonoscopy: 2002 for perforated bowel  PAP: 03/2020  Bone density: done at Dr. Ke[facility] office   Allergies  Allergen Reactions  . Beef-Derived Products Hives  . Latex Rash     Contact  . Sulfa Antibiotics Rash    Current Outpatient Medications  Medication Sig Dispense Refill  . acetaminophen (TYLENOL) 500 MG tablet Take 1,000 mg by mouth every 6 (six) hours as needed for moderate pain or headache.    . amphetamine-dextroamphetamine (ADDERALL XR) 20 MG 24 hr capsule Take 1 capsule (20 mg total) by mouth daily. 30 capsule 0  . anastrozole (ARIMIDEX) 1 MG tablet Take 1 tablet (1 mg total) by mouth daily. 90 tablet 4  . Ascorbic Acid (VITAMIN C PO) Take 3,000 mg by mouth daily.     Marland Kitchen CAMILA 0.35 MG tablet Take 1 tablet by mouth daily.  11  . Cholecalciferol (VITAMIN D3) 75 MCG (3000 UT) TABS Take 3,000 Units by mouth daily.    Mariane Baumgarten Sodium (COLACE PO) Take 2 tablets by mouth 2 (two) times daily.    . Fluocinonide 0.1 % CREA Apply 1 application topically 2 (two) times daily. 240 g 2  . hydroxychloroquine (PLAQUENIL) 200 MG tablet TAKE 1 TABLET BY MOUTH TWICE DAILY MONDAY THROUGH FRIDAY 120 tablet 0  . loratadine (CLARITIN) 10 MG tablet Take 10 mg by mouth See admin instructions. Take 10 mg daily for 5 days after chemo treatments    . magic mouthwash SOLN Take 5 mLs by mouth 4 (four) times daily as needed for mouth pain. 240 mL 0  . metoCLOPramide (REGLAN) 10 MG tablet Take 1 tablet (10 mg total) by mouth every 6 (six) hours as needed for nausea. 40 tablet 2  . metroNIDAZOLE (METROGEL) 1 % gel Apply topically daily. 45 g 0  . mupirocin ointment (BACTROBAN) 2 % Apply 1 application topically 2 (two) times daily. 22 g 0  . oxyCODONE (OXY IR/ROXICODONE) 5 MG immediate release tablet Take 1 tablet (5 mg total) by mouth every 6 (six) hours as needed. 6 tablet 0  . palbociclib (IBRANCE) 125 MG tablet Take 1 tablet (125 mg total) by mouth daily. Take for 21 days on, 7 days off, repeat every 28 days. 21 tablet 6  . Pegfilgrastim (NEULASTA [place]) Inject 1 Dose into the skin See admin instructions. Inject 24 to 72 hours after chemo treatment    . Plecanatide (TRULANCE) 3 MG TABS  Take once daily as needed Lot: [id number], Exp: 10-2022 9 tablet 0  . polyethylene glycol (MIRALAX / GLYCOLAX) 17 g packet Take 34 g by mouth in the morning and at bedtime.    . RESTASIS MULTIDOSE 0.05 % ophthalmic emulsion Place 1 drop into both eyes 2 (two) times daily. 11 mL 5  .  spironolactone (ALDACTONE) 25 MG tablet Take 25 mg by mouth daily as needed (swelling).    . traMADol (ULTRAM) 50 MG tablet Take 1-2 tablets (50-100 mg total) by mouth every 6 (six) hours as needed. (Patient taking differently: Take 50-100 mg by mouth every 6 (six) hours as needed for moderate pain.) 60 tablet 0  . tretinoin (RETIN-A) 0.1 % cream Apply 1 application topically at bedtime as needed (acne).     . triamcinolone lotion (KENALOG) 0.1 % Apply 1 application topically 3 (three) times daily. 120 mL 2  . triamcinolone ointment (KENALOG) 0.5 % Apply to affected toe BID prn 30 g 3  . triamterene-hydrochlorothiazide (DYAZIDE) 37.5-25 MG capsule TAKE 1 CAPSULE BY MOUTH DAILY AS NEEDED FOR SWELLING 90 capsule 0  . UNABLE TO FIND Green Vibrance - 1 scoop daily    . warfarin (COUMADIN) 5 MG tablet Take 1 tablet (5 mg total) by mouth daily. 30 tablet 11   No current facility-administered medications for this visit.   Facility-Administered Medications Ordered in Other Visits  Medication Dose Route Frequency Provider Last Rate Last Admin  . sodium chloride flush (NS) 0.9 % injection 10 mL  10 mL Intracatheter PRN Ozil Stettler, Virgie Dad, MD   10 mL at 02/14/21 1031    OBJECTIVE: White woman who appears stated age  Vitals:   02/14/21 0759  BP: 116/69  Pulse: 69  Resp: 18  Temp: 97.9 F (36.6 C)  SpO2: 100%     Body mass index is 20.03 kg/m.   Wt Readings from Last 3 Encounters:  02/14/21 116 lb 11.2 oz (52.9 kg)  02/06/21 115 lb 9.6 oz (52.4 kg)  01/24/21 115 lb 9.6 oz (52.4 kg)      ECOG FS:2 - Symptomatic, <50% confined to bed  Sclerae unicteric, EOMs intact Wearing a mask No cervical or supraclavicular  adenopathy Lungs no rales or rhonchi Heart regular rate and rhythm Abd soft, nontender, positive bowel sounds MSK no focal spinal tenderness, no upper extremity lymphedema Neuro: nonfocal, well oriented, appropriate affect Breasts: Stable breast exam, with breast difficult to examine as both are lumpy.  Both axillae are benign.   LAB RESULTS:  CMP     Component Value Date/Time   NA 139 02/14/2021 0743   NA 137 07/28/2020 1108   K 4.2 02/14/2021 0743   CL 101 02/14/2021 0743   CO2 25 02/14/2021 0743   GLUCOSE 83 02/14/2021 0743   BUN 20 02/14/2021 0743   BUN 8 07/28/2020 1108   CREATININE 0.99 02/14/2021 0743   CREATININE 0.78 10/20/2020 1330   CALCIUM 8.7 (L) 02/14/2021 0743   PROT 6.6 02/14/2021 0743   PROT 7.3 07/28/2020 1108   ALBUMIN 4.0 02/14/2021 0743   ALBUMIN 4.9 07/28/2020 1108   AST 17 02/14/2021 0743   AST 19 09/21/2020 0827   ALT 13 02/14/2021 0743   ALT 27 09/21/2020 0827   ALKPHOS 73 02/14/2021 0743   BILITOT 0.5 02/14/2021 0743   BILITOT 0.4 09/21/2020 0827   GFRNONAA >60 02/14/2021 0743   GFRNONAA >60 10/20/2020 1330   GFRAA 95 07/28/2020 1108    No results found for: TOTALPROTELP, ALBUMINELP, A1GS, A2GS, BETS, BETA2SER, GAMS, MSPIKE, SPEI  Lab Results  Component Value Date   WBC 2.4 (L) 02/14/2021   NEUTROABS 1.0 (L) 02/14/2021   HGB 11.7 (L) 02/14/2021   HCT 35.5 (L) 02/14/2021   MCV 96.2 02/14/2021   PLT 120 (L) 02/14/2021    No results found for: LABCA2  No  components found for: TTSVXB939  Recent Labs  Lab 02/14/21 0743  INR 2.4*    No results found for: LABCA2  No results found for: QZE092  Lab Results  Component Value Date   ZRA076 29.9 01/03/2021    No results found for: AUQ333  Lab Results  Component Value Date   CA2729 91.9 (H) 01/03/2021    No components found for: HGQUANT  Lab Results  Component Value Date   CEA1 1.88 01/03/2021   /  CEA ([facility])  Date Value Ref Range Status  01/03/2021 1.88 0.00  - 5.00 ng/mL Final    Comment:    (NOTE) This test was performed using Architect's Chemiluminescent Microparticle Immunoassay. Values obtained from different assay methods cannot be used interchangeably. Please note that 5-10% of patients who smoke may see CEA levels up to 6.9 ng/mL. Performed at [facility], [place] [address] [place], [place] [address]      No results found for: AFPTUMOR  No results found for: CHROMOGRNA  No results found for: KPAFRELGTCHN, LAMBDASER, KAPLAMBRATIO (kappa/lambda light chains)  No results found for: HGBA, HGBA2QUANT, HGBFQUANT, HGBSQUAN (Hemoglobinopathy evaluation)   No results found for: LDH  Lab Results  Component Value Date   IRON 88 10/29/2018   IRON 88 10/29/2018   TIBC 241 (L) 10/29/2018   TIBC 241 (L) 10/29/2018   IRONPCTSAT 37 10/29/2018   IRONPCTSAT 37 10/29/2018   (Iron and TIBC)  Lab Results  Component Value Date   FERRITIN 43 10/29/2018   FERRITIN 43 10/29/2018    Urinalysis    Component Value Date/Time   APPEARANCEUR Clear 02/12/2021 1147   GLUCOSEU Negative 02/12/2021 1147   BILIRUBINUR Negative 02/12/2021 1147   PROTEINUR Negative 02/12/2021 1147   NITRITE Negative 02/12/2021 1147   LEUKOCYTESUR Negative 02/12/2021 1147    STUDIES: MR Abdomen W Wo Contrast  Result Date: 01/29/2021 CLINICAL DATA:  Metastatic breast cancer, evaluate liver metastases slip EXAM: MRI ABDOMEN WITHOUT AND WITH CONTRAST TECHNIQUE: Multiplanar multisequence MR imaging of the abdomen was performed both before and after the administration of intravenous contrast. CONTRAST:  30m MULTIHANCE GADOBENATE DIMEGLUMINE 529 MG/ML IV SOLN COMPARISON:  11/06/2020 FINDINGS: Lower chest: No acute findings. Hepatobiliary: No mass or other parenchymal abnormality identified. There are no residual contrast enhancing lesions identified in the liver. There are very subtle, hypoenhancing remnants of the largest previously  hyperenhancing lesions identified, for example in the anterior liver dome, hepatic segment VII (series 13, image 28). Pancreas: No mass, inflammatory changes, or other parenchymal abnormality identified. Spleen:  Within normal limits in size and appearance. Adrenals/Urinary Tract: No masses identified. No evidence of hydronephrosis. Stomach/Bowel: Visualized portions within the abdomen are unremarkable. Vascular/Lymphatic: No pathologically enlarged lymph nodes identified. No abdominal aortic aneurysm demonstrated. Other:  None. Musculoskeletal: No suspicious bone lesions identified. IMPRESSION: 1. There are no residual contrast enhancing lesions identified in the liver. There are very subtle, hypoenhancing treated remnants of the largest previously hyperenhancing lesions. Findings are consistent with treatment response. 2. No other evidence of lymphadenopathy or metastatic disease in the abdomen. Electronically Signed   By: AEddie CandleM.D.   On: 01/29/2021 07:43   XR Foot Complete Right  Result Date: 01/30/2021 X-rays of the right foot reveal possible early cortical elevation of the medial side of the shaft of the third metatarsal bone.  No other abnormality seen.    ELIGIBLE FOR AVAILABLE RESEARCH PROTOCOL: AET  ASSESSMENT: 55y.o. Erica Wood woman status post right breast lower outer  quadrant biopsy 08/04/2020 for a clinical T2 N0 M1, stage IV invasive ductal carcinoma, grade 3, triple positive, with an MIB-1 of 40%  (a) breast MRI 08/14/2020 shows 3 additional right breast lesions and heterogeneous enhancement of the sternum  (b) biopsy of the additional right breast mass 09/05/2020 showed atypical lobular hyperplasia (concordant).  (c) CT scan of the abdomen and pelvis 07/14/2020 shows 2 indeterminate liver lesions  (d) abdominal MRI 08/24/2020 shows multiple liver lesions highly suspicious for liver metastases.  (e) liver biopsy 09/20/2020 positive for carcinoma, prognostic panel estrogen and  progesterone receptor positive, but HER-2 negative (0); MIB-1 of 30%  (f) bone scan 09/14/2020 shows no evidence of bone metastases  (g) CT scan of the chest and head 09/14/2020 showed no evidence of metastatic disease in the chest or brain   (1) neoadjuvant chemotherapy consisting of docetaxel, carboplatin, trastuzumab and pertuzumab every 21 days x 6 started 08/31/2020, completed 12/14/2020  (a) pertuzumab omitted starting with cycle 4  (b) docetaxel omitted with final cycle, gemzar substituted  (2) anti-HER-2 immunotherapy (trastuzumab) to continue indefinitely  (a) echo 04/12/2020 shows an ejection fraction in the 60-65% range  (b) echo 09/25/2020 shows an ejection fraction in the 60 to 65% range  (c) echo 01/10/2021 shows an ejection fraction in the 60-65% range  (3) definitive surgery to follow depending on optimal response to systemic therapy  (4) adjuvant radiation to follow surgery as appropriate  (5) anastrozole to start 01/29/2021  (a) palbociclib/Ibrance to start 01/29/2021  (6) staging studies:  (a) post-NEO chemotherapy breast MRI 12/26/2020 showed right breast index lesion decreased to 0.8 cm; 2 additional masses in the right breast stable  (b) abdominal MRI 01/29/2021 shows no residual measurable disease in the lower  (c) tumor markers: CA 27-29 was 91.9 on 01/03/2021; CEA and CA-125 normal  (7) history LLE DVT/ PE 2003: on chronic anticoagulation (warfarin)  PLAN: Dandra is now 6 months out from initial diagnosis of metastatic breast cancer.  Her only known site of metastatic disease was the liver and the abdominal MRI 01/29/2021 shows no measurable disease in the liver at present.  Accordingly we are considering lumpectomy and adjuvant radiation.  There are 2 spots in her right breast which have not changed despite chemo (this but we did biopsy was significantly improved).  This could be scar tissue could be noninvasive breast cancer or other.  If we are going to  consider lumpectomy, which is her preference, these will need to be biopsied.  I went ahead and placed that order and hopefully we can get that done within the next 2 weeks.  Her INR is fine today, and we will continue that, stopping approximately 5 days before her surgery.  She will not need bridging.  She will resume the warfarin 2 days postop assuming no unusual bleeding  We discussed diet and exercise issues today.  She would like to keep her weight or perhaps lose a little but not gain anymore.  She has a good exercise program at present.  We are not making any change in her palbociclib dose at present  Total encounter time 45 minutes.Sarajane Jews C. Magrinat, MD 02/14/2021 1:12 PM Medical Oncology and Hematology [facility] [place], [place] [address] Tel. [phone]    Fax. [phone]   This document serves as a record of services personally performed by Lurline Del, MD. It was created on his behalf by Joellen Jersey  [place]peck, a trained medical scribe. The creation of this record is based on the scribe's personal observations and the provider's statements to them.   I, Lurline Del MD, have reviewed the above documentation for accuracy and completeness, and I agree with the above.   *Total Encounter Time as defined by the Centers for Medicare and Medicaid Services includes, in addition to the face-to-face time of a patient visit (documented in the note above) non-face-to-face time: obtaining and reviewing outside history, ordering and reviewing medications, tests or procedures, care coordination (communications with other health care professionals or caregivers) and documentation in the medical record.

## 2021-02-14 ENCOUNTER — Inpatient Hospital Stay: Payer: 59 | Attending: Oncology | Admitting: Oncology

## 2021-02-14 ENCOUNTER — Other Ambulatory Visit: Payer: Self-pay

## 2021-02-14 ENCOUNTER — Inpatient Hospital Stay: Payer: 59

## 2021-02-14 ENCOUNTER — Ambulatory Visit: Payer: 59

## 2021-02-14 ENCOUNTER — Other Ambulatory Visit: Payer: 59

## 2021-02-14 VITALS — BP 116/69 | HR 69 | Temp 97.9°F | Resp 18 | Ht 64.0 in | Wt 116.7 lb

## 2021-02-14 DIAGNOSIS — Z86711 Personal history of pulmonary embolism: Secondary | ICD-10-CM

## 2021-02-14 DIAGNOSIS — Z170 Estrogen receptor positive status [ER+]: Secondary | ICD-10-CM

## 2021-02-14 DIAGNOSIS — Z86718 Personal history of other venous thrombosis and embolism: Secondary | ICD-10-CM | POA: Diagnosis not present

## 2021-02-14 DIAGNOSIS — C50511 Malignant neoplasm of lower-outer quadrant of right female breast: Secondary | ICD-10-CM | POA: Diagnosis present

## 2021-02-14 DIAGNOSIS — C787 Secondary malignant neoplasm of liver and intrahepatic bile duct: Secondary | ICD-10-CM | POA: Diagnosis present

## 2021-02-14 DIAGNOSIS — Z7901 Long term (current) use of anticoagulants: Secondary | ICD-10-CM | POA: Diagnosis not present

## 2021-02-14 DIAGNOSIS — Z5112 Encounter for antineoplastic immunotherapy: Secondary | ICD-10-CM | POA: Diagnosis present

## 2021-02-14 LAB — CBC WITH DIFFERENTIAL/PLATELET
Abs Immature Granulocytes: 0.01 10*3/uL (ref 0.00–0.07)
Basophils Absolute: 0.1 10*3/uL (ref 0.0–0.1)
Basophils Relative: 2 %
Eosinophils Absolute: 0 10*3/uL (ref 0.0–0.5)
Eosinophils Relative: 1 %
HCT: 35.5 % — ABNORMAL LOW (ref 36.0–46.0)
Hemoglobin: 11.7 g/dL — ABNORMAL LOW (ref 12.0–15.0)
Immature Granulocytes: 0 %
Lymphocytes Relative: 53 %
Lymphs Abs: 1.3 10*3/uL (ref 0.7–4.0)
MCH: 31.7 pg (ref 26.0–34.0)
MCHC: 33 g/dL (ref 30.0–36.0)
MCV: 96.2 fL (ref 80.0–100.0)
Monocytes Absolute: 0.1 10*3/uL (ref 0.1–1.0)
Monocytes Relative: 5 %
Neutro Abs: 1 10*3/uL — ABNORMAL LOW (ref 1.7–7.7)
Neutrophils Relative %: 39 %
Platelets: 120 10*3/uL — ABNORMAL LOW (ref 150–400)
RBC: 3.69 MIL/uL — ABNORMAL LOW (ref 3.87–5.11)
RDW: 11.9 % (ref 11.5–15.5)
WBC: 2.4 10*3/uL — ABNORMAL LOW (ref 4.0–10.5)
nRBC: 0 % (ref 0.0–0.2)

## 2021-02-14 LAB — COMPREHENSIVE METABOLIC PANEL
ALT: 13 U/L (ref 0–44)
AST: 17 U/L (ref 15–41)
Albumin: 4 g/dL (ref 3.5–5.0)
Alkaline Phosphatase: 73 U/L (ref 38–126)
Anion gap: 13 (ref 5–15)
BUN: 20 mg/dL (ref 6–20)
CO2: 25 mmol/L (ref 22–32)
Calcium: 8.7 mg/dL — ABNORMAL LOW (ref 8.9–10.3)
Chloride: 101 mmol/L (ref 98–111)
Creatinine, Ser: 0.99 mg/dL (ref 0.44–1.00)
GFR, Estimated: >60 mL/min (ref >60)
Glucose, Bld: 83 mg/dL (ref 70–99)
Potassium: 4.2 mmol/L (ref 3.5–5.1)
Sodium: 139 mmol/L (ref 135–145)
Total Bilirubin: 0.5 mg/dL (ref 0.3–1.2)
Total Protein: 6.6 g/dL (ref 6.5–8.1)

## 2021-02-14 LAB — PROTIME-INR
INR: 2.4 — ABNORMAL HIGH (ref 0.8–1.2)
Prothrombin Time: 25.6 seconds — ABNORMAL HIGH (ref 11.4–15.2)

## 2021-02-14 MED ORDER — TRASTUZUMAB-ANNS CHEMO 150 MG IV SOLR
6.0000 mg/kg | Freq: Once | INTRAVENOUS | Status: AC
  Administered 2021-02-14: 315 mg via INTRAVENOUS
  Filled 2021-02-14: qty 15

## 2021-02-14 MED ORDER — DIPHENHYDRAMINE HCL 25 MG PO CAPS
25.0000 mg | ORAL_CAPSULE | Freq: Once | ORAL | Status: AC
  Administered 2021-02-14: 25 mg via ORAL

## 2021-02-14 MED ORDER — DIPHENHYDRAMINE HCL 25 MG PO CAPS
ORAL_CAPSULE | ORAL | Status: AC
  Filled 2021-02-14: qty 1

## 2021-02-14 MED ORDER — SODIUM CHLORIDE 0.9% FLUSH
10.0000 mL | INTRAVENOUS | Status: DC | PRN
  Administered 2021-02-14: 10 mL
  Filled 2021-02-14: qty 10

## 2021-02-14 MED ORDER — HEPARIN SOD (PORK) LOCK FLUSH 100 UNIT/ML IV SOLN
500.0000 [IU] | Freq: Once | INTRAVENOUS | Status: AC | PRN
  Administered 2021-02-14: 500 [IU]
  Filled 2021-02-14: qty 5

## 2021-02-14 MED ORDER — ACETAMINOPHEN 325 MG PO TABS
ORAL_TABLET | ORAL | Status: AC
  Filled 2021-02-14: qty 2

## 2021-02-14 MED ORDER — SODIUM CHLORIDE 0.9 % IV SOLN
Freq: Once | INTRAVENOUS | Status: AC
  Filled 2021-02-14: qty 250

## 2021-02-14 MED ORDER — ACETAMINOPHEN 325 MG PO TABS
650.0000 mg | ORAL_TABLET | Freq: Once | ORAL | Status: AC
  Administered 2021-02-14: 650 mg via ORAL

## 2021-02-14 NOTE — Patient Instructions (Signed)
[facility] Discharge Instructions for Patients Receiving Chemotherapy  Today you received the following chemotherapy agents trastuzumab.  To help prevent nausea and vomiting after your treatment, we encourage you to take your nausea medication as directed.    If you develop nausea and vomiting that is not controlled by your nausea medication, call the clinic.   BELOW ARE SYMPTOMS THAT SHOULD BE REPORTED IMMEDIATELY:  *FEVER GREATER THAN 100.5 F  *CHILLS WITH OR WITHOUT FEVER  NAUSEA AND VOMITING THAT IS NOT CONTROLLED WITH YOUR NAUSEA MEDICATION  *UNUSUAL SHORTNESS OF BREATH  *UNUSUAL BRUISING OR BLEEDING  TENDERNESS IN MOUTH AND THROAT WITH OR WITHOUT PRESENCE OF ULCERS  *URINARY PROBLEMS  *BOWEL PROBLEMS  UNUSUAL RASH Items with * indicate a potential emergency and should be followed up as soon as possible.  Feel free to call the clinic should you have any questions or concerns. The clinic phone number is [phone].  Please show the CHEMO ALERT CARD at check-in to the Emergency Department and triage nurse.   

## 2021-02-15 ENCOUNTER — Telehealth: Payer: Self-pay | Admitting: Oncology

## 2021-02-15 LAB — CANCER ANTIGEN 27.29: CA 27.29: 95.6 U/mL — ABNORMAL HIGH (ref 0.0–38.6)

## 2021-02-15 NOTE — Telephone Encounter (Signed)
Scheduled appts per 4/6 los. Called pt, no answer. Left msg with appts date and times.

## 2021-03-02 ENCOUNTER — Telehealth: Payer: Self-pay

## 2021-03-02 NOTE — Telephone Encounter (Signed)
Approved for Euflexxa series, bilateral knee. Buy & Bill Covered at 100% of the allowable with 0% coinsurance. Deductible and OOP have been met No Co-pay No PA required  Appt. 03/13/2021 with Dr. Junius Roads

## 2021-03-06 ENCOUNTER — Ambulatory Visit: Payer: 59 | Admitting: Family Medicine

## 2021-03-07 ENCOUNTER — Other Ambulatory Visit: Payer: Self-pay

## 2021-03-07 ENCOUNTER — Other Ambulatory Visit: Payer: 59

## 2021-03-07 ENCOUNTER — Ambulatory Visit: Payer: 59 | Admitting: Oncology

## 2021-03-07 ENCOUNTER — Ambulatory Visit: Payer: 59

## 2021-03-07 MED ORDER — AMPHETAMINE-DEXTROAMPHET ER 20 MG PO CP24: 20.0000 mg | ORAL_CAPSULE | Freq: Every day | ORAL | 0 refills | Status: DC

## 2021-03-07 NOTE — Telephone Encounter (Signed)
Patient called requesting prescription refill of Adderall to be sent to [facility] at [address].   

## 2021-03-07 NOTE — Progress Notes (Signed)
[place]  [phone] [phone]) [phone]     ID: Erica Wood DOB: January 17, 1966  MR#: [id number]  DJS#:[id number]  Patient Care Team: Eunice Blase, MD as PCP - General (Family Medicine) Prudence Davidson, Onaway Bing, MD as Referring Physician (Oncology) Rockwell Germany, RN as Oncology Nurse Navigator Mauro Kaufmann, RN as Oncology Nurse Navigator Rolm Bookbinder, MD as Consulting Physician (General Surgery) Idolina Mantell, Virgie Dad, MD as Consulting Physician (Oncology) Kyung Rudd, MD as Consulting Physician (Radiation Oncology) Bo Merino, MD as Consulting Physician (Rheumatology) Olga Millers, MD as Consulting Physician (Obstetrics and Gynecology) Larey Dresser, MD as Consulting Physician (Cardiology) Armbruster, Carlota Raspberry, MD as Consulting Physician (Gastroenterology) Nena Jordan, MD as Referring Physician (Ophthalmology) Chauncey Cruel, MD OTHER MD:  CHIEF COMPLAINT: triple positive stage IV breast cancer  CURRENT TREATMENT: herceptin; anastrozole, palbociclib   INTERVAL HISTORY: Mariavictoria returns today for follow up and treatment of her triple positive breast cancer.   She continues on trastuzumab (indefinitely), repeated every 21 days.  She had been interested in trying the subcutaneous form but we have not been able to get approval from insurance to give that a try--she would have to "fail" 3 bio similars before they would approve it.  At some point however the subcutaneous version will become generic and likely they will approve it at that time.  She started anastrozole and palbociclib on 01/29/2021.  She is tolerating the anastrozole with occasional hot flashes and occasional chills.  There are continuing vaginal dryness issues and she is scheduled to participate in our pelvic physical therapy program beginning 03/13/2021.  She has had no worsening fatigue and no nausea from the palbociclib.  Her most recent echocardiogram on  01/10/2021 showed an ejection fraction of 60-65%.  Lab Results  Component Value Date   CA2729 95.6 (H) 02/14/2021   CA2729 91.9 (H) 01/03/2021    REVIEW OF SYSTEMS: Tamorah met with Dr. Donne Hazel on 02/23/2021 to discuss surgical options.  He is feeling was that surgery is not indicated at this point.  He will be seeing her again in 3 months.  The patient also saw Dr.Rice at [facility] because of her continuing epiphora.  They are planning some cannulization in the near future.  In the meantime she continues on Restasis.  Ravan had a tick bite on her right volar forearm and that is imaged below.  There is no target.  She tells me she feels tired from the Herceptin, she thinks because of the Benadryl.  She does have significant "gel phenomenon" and whenever she sits for a long time and stands up she feels very achy.  Within 2 to 5 minutes no that is gone.  As the weather gets warmer she gets some swelling of her fingers in particular.  She is already on 2 diuretics.  She is worried about her kidneys she says.  A detailed review of systems today was otherwise stable.  COVID 19 VACCINATION STATUS: Status post Pfizer x1   HISTORY OF CURRENT ILLNESS: From the original intake note:  Zenna herself palpated an abnormality in the right breast several years ago.  This had not changed until sometime in June 2021.  She was evaluated by Dr. Ouida Sills and underwent right diagnostic mammography with tomography and right breast ultrasonography at [facility] on 07/26/2020 showing: breast density category D; 2.6 cm mass in right breast at 6 o'clock, at site of palpable concern; two focal groups of punctate calcifications posterior to palpable mass;  no right axillary adenopathy.  Accordingly on 08/04/2020 she proceeded to biopsy of the right breast area in question. The pathology from this procedure ([id number]) showed: invasive ductal carcinoma, grade 3. Prognostic indicators significant for: estrogen receptor,  95% positive with moderate staining intensity and progesterone receptor, 10% positive with strong staining intensity. Proliferation marker Ki67 at 40%. HER2 equivocal by immunohistochemistry (2+), but positive by fluorescent in situ hybridization with a signals ratio 3.23 and number per cell 6.45.  Biopsy of the posterior calcifications was benign.  She also underwent breast MRI on 08/11/2020 showing: breast composition D; 2.6 cm biopsy-proven malignancy in right breast at 6 o'clock, with probable skin involvement noted on previous ultrasound; additional enhancing masses within right breast-- 5 mm in lower-outer, 1.2 cm in upper-inner, 7 mm in upper-outer; no evidence of left breast malignancy or lymphadenopathy; heterogeneous enhancement of sternum, of uncertain significance.  The patient's subsequent history is as detailed below.   PAST MEDICAL HISTORY: Past Medical History:  Diagnosis Date  . Anti-cardiolipin antibody positive   . Anti-cardiolipin antibody syndrome (HCC)   . Anxiety   . Arthralgia   . Arthritis    bil knees, hands  . Atypical chest pain   . Autoimmune disease ([place])   . Breast cancer metastasized to liver ([place]) 09/2020  . Cancer ([place]) 07/2020   right breast IDC  . Chronic fatigue   . Depression   . DVT (deep venous thrombosis) ([place])   . IBS (irritable bowel syndrome)    with constipation  . Lupus ([facility] [place])   . Pulmonary embolus ([place])   . Raynaud's syndrome   . Sicca ([place])   . Thrombocytopenia ([place])     PAST SURGICAL HISTORY: Past Surgical History:  Procedure Laterality Date  . ABLATION  12/2018   leg veins  . ABLATION Right 08/2019   venous leg   . APPENDECTOMY    . COLON SURGERY     Perforated colon repair after colostomy  . GANGLION CYST EXCISION    . HYSTEROSCOPY WITH D & C N/A 01/13/2014   Procedure: DILATATION AND CURETTAGE /HYSTEROSCOPY with resection ;  Surgeon: Olga Millers, MD;  Location: [facility];  Service: Gynecology;  Laterality: N/A;  .  LAPAROTOMY    . PORTACATH PLACEMENT N/A 09/13/2020   Procedure: INSERTION PORT-A-CATH WITH ULTRASOUND GUIDANCE;  Surgeon: Rolm Bookbinder, MD;  Location: [facility];  Service: General;  Laterality: N/A;    FAMILY HISTORY: Family History  Problem Relation Age of Onset  . Rheum arthritis Father   . Hemachromatosis Father   . Cancer Sister        bone   . Colon cancer Neg Hx   . Stomach cancer Neg Hx   . Esophageal cancer Neg Hx   . Pancreatic cancer Neg Hx   . Liver disease Neg Hx   as of October 2021 her father isage 65 and her mother age 83.. The patient had one sister (and no brothers). The sister was diagnosed with Ewing's sarcoma at age 50, and died from the disease after many difficult treatments.    GYNECOLOGIC HISTORY:  Last menstrual period occurred about 6 months prior to the start of chemotherapy Menarche: 16 or 55 years old GX P 0 LMP around 2019 Contraceptive: yes HRT no  Hysterectomy? no BSO? 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 She is widowed. She lives at home with her cat, Mariea Clonts. She is not a Ambulance person.  ADVANCED DIRECTIVES: not in place; at the 08/16/2020 visit the patient was given the appropriate documents to complete and notarized at her discretion.     HEALTH MAINTENANCE: Social History   Tobacco Use  . Smoking status: Former Smoker    Packs/day: 0.50    Years: 5.00    Pack years: 2.50    Types: Cigarettes    Start date: 08/06/2006    Quit date: 08/07/2011    Years since quitting: 9.5  . Smokeless tobacco: Never Used  Vaping Use  . Vaping Use: Never used  Substance Use Topics  . Alcohol use: Yes    Comment: occasionally  . Drug use: No     Colonoscopy: 2002 for perforated bowel  PAP: 03/2020  Bone density: done at Dr. Keane [facility] office   Allergies  Allergen Reactions  . Beef-Derived Products Hives  . Latex Rash    Contact  . Sulfa Antibiotics Rash     Current Outpatient Medications  Medication Sig Dispense Refill  . acetaminophen (TYLENOL) 500 MG tablet Take 1,000 mg by mouth every 6 (six) hours as needed for moderate pain or headache.    . amphetamine-dextroamphetamine (ADDERALL XR) 20 MG 24 hr capsule Take 1 capsule (20 mg total) by mouth daily. 30 capsule 0  . anastrozole (ARIMIDEX) 1 MG tablet Take 1 tablet (1 mg total) by mouth daily. 90 tablet 4  . Ascorbic Acid (VITAMIN C PO) Take 3,000 mg by mouth daily.     Marland Kitchen CAMILA 0.35 MG tablet Take 1 tablet by mouth daily.  11  . Cholecalciferol (VITAMIN D3) 75 MCG (3000 UT) TABS Take 3,000 Units by mouth daily.    Mariane Baumgarten Sodium (COLACE PO) Take 2 tablets by mouth 2 (two) times daily.    . Fluocinonide 0.1 % CREA Apply 1 application topically 2 (two) times daily. 240 g 2  . hydroxychloroquine (PLAQUENIL) 200 MG tablet TAKE 1 TABLET BY MOUTH TWICE DAILY MONDAY THROUGH FRIDAY 120 tablet 0  . magic mouthwash SOLN Take 5 mLs by mouth 4 (four) times daily as needed for mouth pain. 240 mL 0  . metroNIDAZOLE (METROGEL) 1 % gel Apply topically daily. 45 g 0  . mupirocin ointment (BACTROBAN) 2 % Apply 1 application topically 2 (two) times daily. 22 g 0  . palbociclib (IBRANCE) 100 MG tablet Take 1 tablet (100 mg total) by mouth daily. Take for 21 days on, 7 days off, repeat every 28 days. 21 tablet 6  . Plecanatide (TRULANCE) 3 MG TABS Take once daily as needed Lot: [id number], Exp: 10-2022 9 tablet 0  . polyethylene glycol (MIRALAX / GLYCOLAX) 17 g packet Take 34 g by mouth in the morning and at bedtime.    . RESTASIS MULTIDOSE 0.05 % ophthalmic emulsion Place 1 drop into both eyes 2 (two) times daily. 11 mL 5  . spironolactone (ALDACTONE) 25 MG tablet Take 25 mg by mouth daily as needed (swelling).    . traMADol (ULTRAM) 50 MG tablet Take 1-2 tablets (50-100 mg total) by mouth every 6 (six) hours as needed. (Patient taking differently: Take 50-100 mg by mouth every 6 (six) hours as needed for  moderate pain.) 60 tablet 0  . tretinoin (RETIN-A) 0.1 % cream Apply 1 application topically at bedtime as needed (acne).     . triamcinolone ointment (KENALOG) 0.5 % Apply to affected toe BID prn 30 g 3  . triamterene-hydrochlorothiazide (DYAZIDE) 37.5-25 MG capsule TAKE 1 CAPSULE BY MOUTH DAILY AS NEEDED FOR SWELLING 90 capsule  0  . UNABLE TO FIND Green Vibrance - 1 scoop daily    . warfarin (COUMADIN) 5 MG tablet Take 1 tablet (5 mg total) by mouth daily. 30 tablet 11   No current facility-administered medications for this visit.    OBJECTIVE: White woman who appears stated age  Vitals:   03/08/21 0807  BP: 124/68  Pulse: (!) 18  Resp: 18  Temp: 97.9 F (36.6 C)  SpO2: 100%     Body mass index is 20.17 kg/m.   Wt Readings from Last 3 Encounters:  03/08/21 117 lb 8 oz (53.3 kg)  02/14/21 116 lb 11.2 oz (52.9 kg)  02/06/21 115 lb 9.6 oz (52.4 kg)      ECOG FS:2 - Symptomatic, <50% confined to bed  Sclerae unicteric, EOMs intact Wearing a mask No cervical or supraclavicular adenopathy Lungs no rales or rhonchi Heart regular rate and rhythm Abd soft, nontender, positive bowel sounds MSK no focal spinal tenderness, no upper extremity lymphedema Neuro: nonfocal, well oriented, appropriate affect Breasts: Both breasts are lumpy and difficult to examine.  There is no skin or nipple change of concern.  Both axillae are benign.  Right volar forearm tick bite imaged 03/08/2021     LAB RESULTS:  CMP     Component Value Date/Time   NA 140 03/08/2021 0748   NA 137 07/28/2020 1108   K 3.9 03/08/2021 0748   CL 101 03/08/2021 0748   CO2 27 03/08/2021 0748   GLUCOSE 111 (H) 03/08/2021 0748   BUN 9 03/08/2021 0748   BUN 8 07/28/2020 1108   CREATININE 1.01 (H) 03/08/2021 0748   CREATININE 0.78 10/20/2020 1330   CALCIUM 9.0 03/08/2021 0748   PROT 6.8 03/08/2021 0748   PROT 7.3 07/28/2020 1108   ALBUMIN 4.2 03/08/2021 0748   ALBUMIN 4.9 07/28/2020 1108   AST 20  03/08/2021 0748   AST 19 09/21/2020 0827   ALT 12 03/08/2021 0748   ALT 27 09/21/2020 0827   ALKPHOS 80 03/08/2021 0748   BILITOT 0.5 03/08/2021 0748   BILITOT 0.4 09/21/2020 0827   GFRNONAA >60 03/08/2021 0748   GFRNONAA >60 10/20/2020 1330   GFRAA 95 07/28/2020 1108    No results found for: TOTALPROTELP, ALBUMINELP, A1GS, A2GS, BETS, BETA2SER, GAMS, MSPIKE, SPEI  Lab Results  Component Value Date   WBC 2.6 (L) 03/08/2021   NEUTROABS 1.3 (L) 03/08/2021   HGB 12.1 03/08/2021   HCT 36.7 03/08/2021   MCV 97.1 03/08/2021   PLT 251 03/08/2021    No results found for: LABCA2  No components found for: TDVVOH607  Recent Labs  Lab 03/08/21 0748  INR 1.9*    No results found for: LABCA2  No results found for: PXT062  Lab Results  Component Value Date   CAN125 29.9 01/03/2021    No results found for: IRS854  Lab Results  Component Value Date   CA2729 95.6 (H) 02/14/2021    No components found for: HGQUANT  Lab Results  Component Value Date   CEA1 1.88 01/03/2021   /  CEA ([facility])  Date Value Ref Range Status  01/03/2021 1.88 0.00 - 5.00 ng/mL Final    Comment:    (NOTE) This test was performed using Architect's Chemiluminescent Microparticle Immunoassay. Values obtained from different assay methods cannot be used interchangeably. Please note that 5-10% of patients who smoke may see CEA levels up to 6.9 ng/mL. Performed at [facility], [facility] [address] [place], [place] [address]  No results found for: AFPTUMOR  No results found for: CHROMOGRNA  No results found for: KPAFRELGTCHN, LAMBDASER, KAPLAMBRATIO (kappa/lambda light chains)  No results found for: HGBA, HGBA2QUANT, HGBFQUANT, HGBSQUAN (Hemoglobinopathy evaluation)   No results found for: LDH  Lab Results  Component Value Date   IRON 88 10/29/2018   IRON 88 10/29/2018   TIBC 241 (L) 10/29/2018   TIBC 241 (L) 10/29/2018   IRONPCTSAT 37 10/29/2018    IRONPCTSAT 37 10/29/2018   (Iron and TIBC)  Lab Results  Component Value Date   FERRITIN 43 10/29/2018   FERRITIN 43 10/29/2018    Urinalysis    Component Value Date/Time   APPEARANCEUR Clear 02/12/2021 1147   GLUCOSEU Negative 02/12/2021 1147   BILIRUBINUR Negative 02/12/2021 1147   PROTEINUR Negative 02/12/2021 1147   NITRITE Negative 02/12/2021 1147   LEUKOCYTESUR Negative 02/12/2021 1147    STUDIES: No results found.   ELIGIBLE FOR AVAILABLE RESEARCH PROTOCOL: AET  ASSESSMENT: 55 y.o. [place] woman status post right breast lower outer quadrant biopsy 08/04/2020 for a clinical T2 N0 M1, stage IV invasive ductal carcinoma, grade 3, triple positive, with an MIB-1 of 40%  (a) breast MRI 08/14/2020 shows 3 additional right breast lesions and heterogeneous enhancement of the sternum  (b) biopsy of one of the additional right breast masses 09/05/2020 showed atypical lobular hyperplasia (concordant).  (c) CT scan of the abdomen and pelvis 07/14/2020 shows 2 indeterminate liver lesions  (d) abdominal MRI 08/24/2020 shows multiple liver lesions highly suspicious for liver metastases.  (e) liver biopsy 09/20/2020 positive for carcinoma, prognostic panel estrogen and progesterone receptor positive, but HER-2 negative (0); MIB-1 of 30%  (f) bone scan 09/14/2020 shows no evidence of bone metastases  (g) CT scan of the chest and head 09/14/2020 showed no evidence of metastatic disease in the chest or brain  (h) CA 27-29 was 91.9 on 01/03/2021; CEA and CA-125 normal   (1) neoadjuvant chemotherapy consisting of docetaxel, carboplatin, trastuzumab and pertuzumab every 21 days x 6 started 08/31/2020, completed 12/14/2020  (a) pertuzumab omitted starting with cycle 4  (b) docetaxel omitted with final cycle, gemzar substituted  (2) anti-HER-2 immunotherapy (trastuzumab) to continue indefinitely  (a) echo 04/12/2020 shows an ejection fraction in the 60-65% range  (b) echo 09/25/2020 shows  an ejection fraction in the 60 to 65% range  (c) echo 01/10/2021 shows an ejection fraction in the 60-65% range  (3) definitive surgery to follow depending on optimal response to systemic therapy  (4) adjuvant radiation to follow surgery as appropriate  (5) anastrozole started 01/29/2021  (a) palbociclib/Ibrance started 01/29/2021  (b) palbociclib dose decreased to 100 mg beginning with May 2022 cycle due to cytopenias  (6) staging studies:  (a) post-NEO chemotherapy breast MRI 12/26/2020 showed right breast index lesion decreased to 0.8 cm; 2 additional masses in the right breast stable  (b) abdominal MRI 01/29/2021 shows no residual measurable disease in the lower   (7) history LLE DVT/ PE 2003: on chronic anticoagulation (warfarin)   PLAN: Litha is now a little more than half a year out from definitive diagnosis of metastatic breast cancer.  Her disease is very well controlled and she has no symptoms directly related to the cancer.  She is tolerating treatment moderately well.  It is difficult to know what is causing what since she also has lupus and is on Plaquenil.  I think it would be helpful if her rheumatologist and I coordinated care.  Perhaps low doses of Cytoxan which she could  easily receive at the same time as she receive her Herceptin every 3weeks would control the rheumatologic issues better but that is an area of course I would defer to Dr. Estanislado Pandy in.  I reassured Pragya that her kidneys are doing fine.  I am eliminating Benadryl from her premeds with Herceptin since that makes her too sleepy and by now she likely does not need it.  I have encouraged her to walk more regularly.  I would personally feel safer if we went ahead and took care of the local area, with surgery and radiation, since I anticipate long term disease-free survival in this patient and I would not want spread to the chest wall.  On the other hand her surgeon is entirely correct that there is no survival  advantage to proceeding with surgery at this point.  I am going to obtain a breast MRI late June before her next visit with him so that we can discuss this further.  I am also obtaining a repeat echo in June and a baseline bone density  Total encounter time 40 minutes.* Total encounter time 45 minutes.Sarajane Jews C. Jenese Mischke, MD 03/08/2021 8:37 AM Medical Oncology and Hematology [facility] [place], [place] [address] Tel. [phone]    Fax. [phone]   This document serves as a record of services personally performed by Lurline Del, MD. It was created on his behalf by Wilburn Mylar, a trained medical scribe. The creation of this record is based on the scribe's personal observations and the provider's statements to them.   I, Lurline Del MD, have reviewed the above documentation for accuracy and completeness, and I agree with the above.   *Total Encounter Time as defined by the Centers for Medicare and Medicaid Services includes, in addition to the face-to-face time of a patient visit (documented in the note above) non-face-to-face time: obtaining and reviewing outside history, ordering and reviewing medications, tests or procedures, care coordination (communications with other health care professionals or caregivers) and documentation in the medical record.

## 2021-03-07 NOTE — Telephone Encounter (Signed)
Next Visit: 05/29/2021  Last Visit: 12/28/2020  Last Fill: 02/06/2021  Dx: Autoimmune disease   Current Dose per office note on 12/28/2020, not mentioned  Okay to refill Adderal?

## 2021-03-08 ENCOUNTER — Telehealth: Payer: Self-pay

## 2021-03-08 ENCOUNTER — Telehealth: Payer: Self-pay | Admitting: Oncology

## 2021-03-08 ENCOUNTER — Inpatient Hospital Stay: Payer: 59

## 2021-03-08 ENCOUNTER — Inpatient Hospital Stay (HOSPITAL_BASED_OUTPATIENT_CLINIC_OR_DEPARTMENT_OTHER): Payer: 59 | Admitting: Oncology

## 2021-03-08 ENCOUNTER — Other Ambulatory Visit: Payer: Self-pay

## 2021-03-08 ENCOUNTER — Ambulatory Visit: Payer: 59 | Admitting: Family Medicine

## 2021-03-08 VITALS — BP 124/68 | HR 18 | Temp 97.9°F | Resp 18 | Ht 64.0 in | Wt 117.5 lb

## 2021-03-08 DIAGNOSIS — C787 Secondary malignant neoplasm of liver and intrahepatic bile duct: Secondary | ICD-10-CM | POA: Diagnosis not present

## 2021-03-08 DIAGNOSIS — C50511 Malignant neoplasm of lower-outer quadrant of right female breast: Secondary | ICD-10-CM

## 2021-03-08 DIAGNOSIS — Z170 Estrogen receptor positive status [ER+]: Secondary | ICD-10-CM

## 2021-03-08 DIAGNOSIS — Z5112 Encounter for antineoplastic immunotherapy: Secondary | ICD-10-CM | POA: Diagnosis not present

## 2021-03-08 DIAGNOSIS — Z86711 Personal history of pulmonary embolism: Secondary | ICD-10-CM

## 2021-03-08 LAB — COMPREHENSIVE METABOLIC PANEL
ALT: 12 U/L (ref 0–44)
AST: 20 U/L (ref 15–41)
Albumin: 4.2 g/dL (ref 3.5–5.0)
Alkaline Phosphatase: 80 U/L (ref 38–126)
Anion gap: 12 (ref 5–15)
BUN: 9 mg/dL (ref 6–20)
CO2: 27 mmol/L (ref 22–32)
Calcium: 9 mg/dL (ref 8.9–10.3)
Chloride: 101 mmol/L (ref 98–111)
Creatinine, Ser: 1.01 mg/dL — ABNORMAL HIGH (ref 0.44–1.00)
GFR, Estimated: >60 mL/min (ref >60)
Glucose, Bld: 111 mg/dL — ABNORMAL HIGH (ref 70–99)
Potassium: 3.9 mmol/L (ref 3.5–5.1)
Sodium: 140 mmol/L (ref 135–145)
Total Bilirubin: 0.5 mg/dL (ref 0.3–1.2)
Total Protein: 6.8 g/dL (ref 6.5–8.1)

## 2021-03-08 LAB — CBC WITH DIFFERENTIAL/PLATELET
Abs Immature Granulocytes: 0.01 10*3/uL (ref 0.00–0.07)
Basophils Absolute: 0.1 10*3/uL (ref 0.0–0.1)
Basophils Relative: 3 %
Eosinophils Absolute: 0 10*3/uL (ref 0.0–0.5)
Eosinophils Relative: 0 %
HCT: 36.7 % (ref 36.0–46.0)
Hemoglobin: 12.1 g/dL (ref 12.0–15.0)
Immature Granulocytes: 0 %
Lymphocytes Relative: 43 %
Lymphs Abs: 1.1 10*3/uL (ref 0.7–4.0)
MCH: 32 pg (ref 26.0–34.0)
MCHC: 33 g/dL (ref 30.0–36.0)
MCV: 97.1 fL (ref 80.0–100.0)
Monocytes Absolute: 0.1 10*3/uL (ref 0.1–1.0)
Monocytes Relative: 5 %
Neutro Abs: 1.3 10*3/uL — ABNORMAL LOW (ref 1.7–7.7)
Neutrophils Relative %: 49 %
Platelets: 251 10*3/uL (ref 150–400)
RBC: 3.78 MIL/uL — ABNORMAL LOW (ref 3.87–5.11)
RDW: 14.5 % (ref 11.5–15.5)
WBC: 2.6 10*3/uL — ABNORMAL LOW (ref 4.0–10.5)
nRBC: 0 % (ref 0.0–0.2)

## 2021-03-08 LAB — PROTIME-INR
INR: 1.9 — ABNORMAL HIGH (ref 0.8–1.2)
Prothrombin Time: 21.7 seconds — ABNORMAL HIGH (ref 11.4–15.2)

## 2021-03-08 MED ORDER — PALBOCICLIB 100 MG PO TABS: 100.0000 mg | ORAL_TABLET | Freq: Every day | ORAL | 6 refills | Status: DC

## 2021-03-08 NOTE — Telephone Encounter (Signed)
Scheduled appts per 4/28 los. Pt aware.

## 2021-03-08 NOTE — Telephone Encounter (Signed)
Returned call to pt. Verified pt's MRI is scheduled when Dr. Jana Hakim wanted it to be.

## 2021-03-09 ENCOUNTER — Other Ambulatory Visit: Payer: 59

## 2021-03-09 ENCOUNTER — Inpatient Hospital Stay: Payer: 59

## 2021-03-09 VITALS — BP 104/57 | HR 68 | Temp 98.2°F | Resp 18

## 2021-03-09 DIAGNOSIS — Z5112 Encounter for antineoplastic immunotherapy: Secondary | ICD-10-CM | POA: Diagnosis not present

## 2021-03-09 DIAGNOSIS — C787 Secondary malignant neoplasm of liver and intrahepatic bile duct: Secondary | ICD-10-CM

## 2021-03-09 LAB — CANCER ANTIGEN 27.29: CA 27.29: 98.3 U/mL — ABNORMAL HIGH (ref 0.0–38.6)

## 2021-03-09 MED ORDER — ACETAMINOPHEN 325 MG PO TABS
650.0000 mg | ORAL_TABLET | Freq: Once | ORAL | Status: AC
  Administered 2021-03-09: 650 mg via ORAL

## 2021-03-09 MED ORDER — TRASTUZUMAB-ANNS CHEMO 150 MG IV SOLR
6.0000 mg/kg | Freq: Once | INTRAVENOUS | Status: AC
  Administered 2021-03-09: 315 mg via INTRAVENOUS
  Filled 2021-03-09: qty 15

## 2021-03-09 MED ORDER — SODIUM CHLORIDE 0.9% FLUSH
10.0000 mL | INTRAVENOUS | Status: DC | PRN
  Administered 2021-03-09: 10 mL
  Filled 2021-03-09: qty 10

## 2021-03-09 MED ORDER — HEPARIN SOD (PORK) LOCK FLUSH 100 UNIT/ML IV SOLN
500.0000 [IU] | Freq: Once | INTRAVENOUS | Status: AC | PRN
  Administered 2021-03-09: 500 [IU]
  Filled 2021-03-09: qty 5

## 2021-03-09 MED ORDER — ACETAMINOPHEN 325 MG PO TABS
ORAL_TABLET | ORAL | Status: AC
  Filled 2021-03-09: qty 2

## 2021-03-09 MED ORDER — SODIUM CHLORIDE 0.9 % IV SOLN
Freq: Once | INTRAVENOUS | Status: AC
  Filled 2021-03-09: qty 250

## 2021-03-09 NOTE — Patient Instructions (Signed)
[facility] Discharge Instructions for Patients Receiving Chemotherapy  Today you received the following chemotherapy agents trastuzumab.  To help prevent nausea and vomiting after your treatment, we encourage you to take your nausea medication as directed.    If you develop nausea and vomiting that is not controlled by your nausea medication, call the clinic.   BELOW ARE SYMPTOMS THAT SHOULD BE REPORTED IMMEDIATELY:  *FEVER GREATER THAN 100.5 F  *CHILLS WITH OR WITHOUT FEVER  NAUSEA AND VOMITING THAT IS NOT CONTROLLED WITH YOUR NAUSEA MEDICATION  *UNUSUAL SHORTNESS OF BREATH  *UNUSUAL BRUISING OR BLEEDING  TENDERNESS IN MOUTH AND THROAT WITH OR WITHOUT PRESENCE OF ULCERS  *URINARY PROBLEMS  *BOWEL PROBLEMS  UNUSUAL RASH Items with * indicate a potential emergency and should be followed up as soon as possible.  Feel free to call the clinic should you have any questions or concerns. The clinic phone number is [phone].  Please show the CHEMO ALERT CARD at check-in to the Emergency Department and triage nurse.   

## 2021-03-13 ENCOUNTER — Ambulatory Visit: Payer: 59 | Attending: Oncology | Admitting: Physical Therapy

## 2021-03-13 ENCOUNTER — Ambulatory Visit (INDEPENDENT_AMBULATORY_CARE_PROVIDER_SITE_OTHER): Payer: 59 | Admitting: Family Medicine

## 2021-03-13 ENCOUNTER — Other Ambulatory Visit: Payer: Self-pay

## 2021-03-13 DIAGNOSIS — M1711 Unilateral primary osteoarthritis, right knee: Secondary | ICD-10-CM | POA: Diagnosis not present

## 2021-03-13 DIAGNOSIS — M6281 Muscle weakness (generalized): Secondary | ICD-10-CM | POA: Insufficient documentation

## 2021-03-13 DIAGNOSIS — M1712 Unilateral primary osteoarthritis, left knee: Secondary | ICD-10-CM

## 2021-03-13 DIAGNOSIS — R293 Abnormal posture: Secondary | ICD-10-CM | POA: Diagnosis present

## 2021-03-13 NOTE — Progress Notes (Signed)
Office Visit Note   Patient: Erica Wood           Date of Birth: 02-21-1966           MRN: [id number] Visit Date: 03/13/2021 Requested by: Eunice Blase, MD [address],  [place] [address] PCP: Eunice Blase, MD  Subjective: Chief Complaint  Patient presents with  . Right Knee - Pain, Follow-up    Euflexxa injections in both knees #1 (of 3)  . Left Knee - Pain, Follow-up    HPI: She is here for planned bilateral Euflexxa injections for knee osteoarthritis.              ROS:   All other systems were reviewed and are negative.  Objective: Vital Signs: There were no vitals taken for this visit.  Physical Exam:  General:  Alert and oriented, in no acute distress. Pulm:  Breathing unlabored. Psy:  Normal mood, congruent affect. Skin: No erythema Knees: No significant effusions today.  Imaging: No results found.  Assessment & Plan: 1.  Bilateral knee osteoarthritis -Euflexxa injections today.  Follow-up in a week for the second of 3 injections.     Procedures: Bilateral knee injections: After sterile prep with Betadine, injected 3 cc 0.25% bupivacaine and Euflexxa from superolateral approach.       PMFS History: Patient Active Problem List   Diagnosis Date Noted  . Liver metastases ([place]) 01/03/2021  . Port-A-Cath in place 11/02/2020  . Aortic atherosclerosis ([place]) 09/21/2020  . Malignant neoplasm of lower-outer quadrant of right breast of female, estrogen receptor positive ([place]) 08/10/2020  . Abnormal cervical Papanicolaou smear 07/12/2020  . Pulmonary embolism ([place]) 07/12/2020  . Fibrocystic disease of breast 01/22/2018  . Primary osteoarthritis of both knees 05/02/2017  . Raynaud's disease without gangrene 11/26/2016  . 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 Anticardiolipin antibody positive 11/26/2016  . Autoimmune disease ([place]) 11/25/2016  . High risk medication use 11/25/2016  . Primary osteoarthritis of both hands 11/25/2016  . History of  DVT (deep vein thrombosis) 11/25/2016  . History of pulmonary embolism 11/25/2016  . Primary insomnia 11/25/2016  . Anxiety 11/25/2016  . Irritable bowel syndrome with constipation 11/25/2016  . Raynaud's phenomenon 01/03/2016  . Varicose veins of lower extremities with other complications 39/76/7341  . Spider veins of both lower extremities 05/24/2014   Past Medical History:  Diagnosis Date  . Anti-cardiolipin antibody positive   . Anti-cardiolipin antibody syndrome (HCC)   . Anxiety   . Arthralgia   . Arthritis    bil knees, hands  . Atypical chest pain   . Autoimmune disease ([place])   . Breast cancer metastasized to liver ([place]) 09/2020  . Cancer ([place]) 07/2020   right breast IDC  . Chronic fatigue   . Depression   . DVT (deep venous thrombosis) ([place])   . IBS (irritable bowel syndrome)    with constipation  . Lupus ([place])   . Pulmonary embolus ([place])   . Raynaud's syndrome   . Sicca ([place])   . Thrombocytopenia ([place])     Family History  Problem Relation Age of Onset  . Rheum arthritis Father   . Hemachromatosis Father   . Cancer Sister        bone   . Colon cancer Neg Hx   . Stomach cancer Neg Hx   . Esophageal cancer Neg Hx   . Pancreatic cancer Neg Hx   . Liver disease Neg Hx     Past Surgical History:  Procedure  Laterality Date  . ABLATION  12/2018   leg veins  . ABLATION Right 08/2019   venous leg   . APPENDECTOMY    . COLON SURGERY     Perforated colon repair after colostomy  . GANGLION CYST EXCISION    . HYSTEROSCOPY WITH D & C N/A 01/13/2014   Procedure: DILATATION AND CURETTAGE /HYSTEROSCOPY with resection ;  Surgeon: Olga Millers, MD;  Location: [facility];  Service: Gynecology;  Laterality: N/A;  . LAPAROTOMY    . PORTACATH PLACEMENT N/A 09/13/2020   Procedure: INSERTION PORT-A-CATH WITH ULTRASOUND GUIDANCE;  Surgeon: Rolm Bookbinder, MD;  Location: [facility];  Service: General;  Laterality: N/A;   Social History   Occupational  History  . Not on file  Tobacco Use  . Smoking status: Former Smoker    Packs/day: 0.50    Years: 5.00    Pack years: 2.50    Types: Cigarettes    Start date: 08/06/2006    Quit date: 08/07/2011    Years since quitting: 9.6  . Smokeless tobacco: Never Used  Vaping Use  . Vaping Use: Never used  Substance and Sexual Activity  . Alcohol use: Yes    Comment: occasionally  . Drug use: No  . Sexual activity: Yes    Birth control/protection: Pill

## 2021-03-13 NOTE — Patient Instructions (Signed)
Inform Yourself About Menopause  ? Osteoporosis Risk is higher after menopause 1 in 2 women and 1 in 5 men have osteoporosis in [place] and [place] is closing the gap (possibly due to more adopting more Western lifestyle?) Strategies to prevent: eat green leafy vegetables, fish, seaweeds, fermented soy, fermented foods, and stay hydrated; get exercise; get adequate sleep Avoid: sodas (caffeine, non-caffeine, diet, or regular) - studies show increased risk for hip fracture  ? SUI (stress urinary incontinence)  Risk factors: obesity, lack of exercise, constipation, hypertension Half of women ages 73-90 experience leakage monthly  ? LUTS (lower urinary tract symptoms)  Regular sex decreases LUTS These symptoms are 3-6x more likely in women not sexually active verses women who are  ? Sexual Pain Possible causes: post surgery (such as hysterectomy, pelvic floor muscle dysfunction, muscle and skeletal dysfunction of low back, trunk, hips, vaginal dryness, previous trauma, anticipation of pain can cause pain  ? Estrogen Almost every cell in the human body has estrogen receptors Tissues hold more water when estrogen present Low estrogen can deplete good bacteria causing pH issues in the vagina Low estrogen can cause tissues and muscle to atrophy Lower estrogen can occur in Menopause also post-partum Estrogen dominance causes constipation Xenoestrogens: come from environment and are similar to estrogens but have negative effect on the body include artificial fragrances, DDT, PCB, plastics (things like Teflon pans have some of these) Phytoestrogens: plant based estrogens such as soy, fermented soy, lentils, and chickpeas  ? Testosterone Estrogen is not the only hormone that decreases - women SOMETIMES lose testosterone too (sometimes there is an excess)  Testosterone may be low if there is low sex drive, decrease in muscle mass, decrease in bone density, depression, achy joints, chronic fatigue,  dry skin, incontinence  Things that help increase testosterone: Exercise and eat foods with Zinc (oysters, crab, lean beef and poultry, dairy (yogurt, cheese), nuts, beans, chickpeas, cashews, almonds   ? Bowel function Fecal incontinence more prevalent in menopausal women Increased risk in women who have experienced complicated deliveries (both vaginal and c-section) and other risk factors: urinary incontinence, obesity, smoking, chronic diarrhea, IBS, cholecystectomy, anal penetrative intercourse Stress: can cause nausea, vomiting, abdominal pain, bowel issues ? Pelvic organ prolapse (POP) With or without mesh, conservative treatment is recommended first! Systematic review of the recent literature VanGeelen and Dwyer 'Conservative treatment is the first option in women with POP.  ? Posture Improved posture is better for joints and overall function.  Study shows that upright posture improves testosterone production in men.  Posture also shown to improve mood and self-confidence across all genders.  #Menopause is a NORMAL process; NOT a disease process Studies show in cultures that view aging as something to be revered, there are much fewer if any negative symptoms experienced during menopause! Big picture is that symptoms are reduced with greater overall health: Hydration, sleep, take time for deep breathing and having fun, practice things that bring joy and gratitude, exercise and move in ways that feel good for your body  Moisturizers . They are used in the vagina to hydrate the mucous membrane that make up the vaginal canal. . Designed to keep a more normal acid balance (ph) . Once placed in the vagina, it will last between two to three days.  . Use 2-3 times per week at bedtime  . Ingredients to avoid is glycerin and fragrance, can increase chance of infection . Should not be used just before sex due to causing irritation .  Most are gels administered either in a tampon-shaped  applicator or as a vaginal suppository. They are non-hormonal.   Types of Moisturizers(internal use)  . Vitamin E vaginal suppositories- Whole foods, Amazon . Moist Again . Coconut oil- can break down condoms . Julva- (Do no use if on Tamoxifen) amazon . Yes moisturizer- amazon . NeuEve Silk , NeuEve Silver for menopausal or over 65 (if have severe vaginal atrophy or cancer treatments use NeuEve Silk for  1 month than move to The Pepsi)- [employer], MapleFlower.dk . Olive and Bee intimate cream- www.oliveandbee.com.au . Mae vaginal Avalon . Aloe .    Creams to use externally on the Vulva area  Albertson's (good for for cancer patients that had radiation to the area)- [place] or [employer].FlyingBasics.com.br  V-magic cream - amazon  Julva-amazon  Vital "V Wild Yam salve ( help moisturize and help with thinning vulvar area, does have [place] by Irwin Brakeman labial moisturizer ([place],   Coconut or olive oil  aloe   Things to avoid in the vaginal area . Do not use things to irritate the vulvar area . No lotions just specialized creams for the vulva area- Neogyn, V-magic, No soaps; can use Aveeno or Calendula cleanser if needed. Must be gentle . No deodorants . No douches . Good to sleep without underwear to let the vaginal area to air out . No scrubbing: spread the lips to let warm water rinse over labias and pat dry

## 2021-03-14 ENCOUNTER — Telehealth: Payer: Self-pay | Admitting: Gastroenterology

## 2021-03-14 ENCOUNTER — Encounter: Payer: Self-pay | Admitting: Family Medicine

## 2021-03-14 MED ORDER — TRULANCE 3 MG PO TABS: 3.0000 mg | ORAL_TABLET | Freq: Every day | ORAL | 2 refills | Status: DC

## 2021-03-14 NOTE — Telephone Encounter (Signed)
Called and spoke to patient.  Let her know we sent a prescription to her pharmacy. Regarding her gas we discussed cutting back on foods that can cause gas like apples broccoli, beans, legumes; avoid artifical sweeteners, eating quickly, drinking a lot of carbonated drinks.  Indicated she can take simethicone or Gas-X to relieve pain and bloating from gas.

## 2021-03-14 NOTE — Telephone Encounter (Signed)
Inbound call from patient stating she received some samples for Trulance and would a prescription for medication to be sent to [facility] on [address] please.  Also was asking if you can suggest any medication for gas.  Please advise.
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
## 2021-03-16 ENCOUNTER — Telehealth: Payer: Self-pay

## 2021-03-16 NOTE — Telephone Encounter (Signed)
PA for Trulance 3mg  qd submitted via CoverMyMeds. KeyBerta Wood - PA Case ID: [id number] - Rx #: [id number]

## 2021-03-19 MED ORDER — MOTEGRITY 2 MG PO TABS: 2.0000 mg | ORAL_TABLET | Freq: Every day | ORAL | 0 refills | Status: DC

## 2021-03-19 NOTE — Telephone Encounter (Signed)
PA for Trulance denied. Patient must try and fail Motegrity. Please advise. (FYI - we do currently have Motegrity samples in stock).

## 2021-03-19 NOTE — Addendum Note (Signed)
Addended by: Roetta Sessions on: 03/19/2021 05:44 PM   Modules accepted: Orders

## 2021-03-19 NOTE — Telephone Encounter (Signed)
Called patient. She will pick up samples of Motegrity and try once daily prn and let us know how it works for her.

## 2021-03-19 NOTE — Telephone Encounter (Signed)
Thanks Jan, If her insurance requires her to fail Motegrity before prescribing Trulance, Motegrity is a good option we can try that at 2 mg a day as needed if she wants to try that first. Thanks

## 2021-03-20 ENCOUNTER — Other Ambulatory Visit: Payer: Self-pay

## 2021-03-20 ENCOUNTER — Ambulatory Visit (INDEPENDENT_AMBULATORY_CARE_PROVIDER_SITE_OTHER): Payer: 59 | Admitting: Family Medicine

## 2021-03-20 ENCOUNTER — Telehealth: Payer: Self-pay | Admitting: *Deleted

## 2021-03-20 DIAGNOSIS — M170 Bilateral primary osteoarthritis of knee: Secondary | ICD-10-CM

## 2021-03-20 DIAGNOSIS — M1712 Unilateral primary osteoarthritis, left knee: Secondary | ICD-10-CM

## 2021-03-20 DIAGNOSIS — M1711 Unilateral primary osteoarthritis, right knee: Secondary | ICD-10-CM

## 2021-03-20 NOTE — Progress Notes (Signed)
Office Visit Note   Patient: Erica Wood           Date of Birth: 1966-06-10           MRN: [id number] Visit Date: 03/20/2021 Requested by: Eunice Blase, MD [address] [place],  [place] [address] PCP: Eunice Blase, MD  Subjective: Chief Complaint  Patient presents with  . Right Knee - Pain, Follow-up    Euflexxa injections, bilaterally, #2  . Left Knee - Pain, Follow-up    HPI: She is here for Euflexxa No. 2 for bilateral knee osteoarthritis.  She is doing well so far.                ROS:   All other systems were reviewed and are negative.  Objective: Vital Signs: There were no vitals taken for this visit.  Physical Exam:  General:  Alert and oriented, in no acute distress. Pulm:  Breathing unlabored. Psy:  Normal mood, congruent affect  Knees: Trace effusions bilaterally with no warmth.   Imaging: No results found.  Assessment & Plan: 1.  Bilateral knee osteoarthritis -Euflexxa No. 2 today, follow-up in a week for the final injections. -Incidentally, she will be having tear duct surgery in June and will need to get instructions on bridging her Lovenox.     Procedures: Bilateral knee injections: After sterile prep with Betadine, injected 3 cc 0.25% bupivacaine and Euflexxa from superolateral approach.       PMFS History: Patient Active Problem List   Diagnosis Date Noted  . Liver metastases ([place]) 01/03/2021  . Port-A-Cath in place 11/02/2020  . Aortic atherosclerosis ([place]) 09/21/2020  . Malignant neoplasm of lower-outer quadrant of right breast of female, estrogen receptor positive ([place]) 08/10/2020  . Abnormal cervical Papanicolaou smear 07/12/2020  . Pulmonary embolism ([place]) 07/12/2020  . Fibrocystic disease of breast 01/22/2018  . Primary osteoarthritis of both knees 05/02/2017  . Raynaud's disease without gangrene 11/26/2016  . 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 Anticardiolipin antibody positive 11/26/2016  . Autoimmune disease ([place])  11/25/2016  . High risk medication use 11/25/2016  . Primary osteoarthritis of both hands 11/25/2016  . History of DVT (deep vein thrombosis) 11/25/2016  . History of pulmonary embolism 11/25/2016  . Primary insomnia 11/25/2016  . Anxiety 11/25/2016  . Irritable bowel syndrome with constipation 11/25/2016  . Raynaud's phenomenon 01/03/2016  . Varicose veins of lower extremities with other complications 33/29/5188  . Spider veins of both lower extremities 05/24/2014   Past Medical History:  Diagnosis Date  . Anti-cardiolipin antibody positive   . Anti-cardiolipin antibody syndrome (HCC)   . Anxiety   . Arthralgia   . Arthritis    bil knees, hands  . Atypical chest pain   . Autoimmune disease ([place])   . Breast cancer metastasized to liver ([place]) 09/2020  . Cancer ([place]) 07/2020   right breast IDC  . Chronic fatigue   . Depression   . DVT (deep venous thrombosis) ([place])   . IBS (irritable bowel syndrome)    with constipation  . Lupus ([place])   . Pulmonary embolus ([place])   . Raynaud's syndrome   . Sicca ([place])   . Thrombocytopenia ([place])     Family History  Problem Relation Age of Onset  . Rheum arthritis Father   . Hemachromatosis Father   . Cancer Sister        bone   . Colon cancer Neg Hx   . Stomach cancer Neg Hx   . Esophageal cancer Neg  Hx   . Pancreatic cancer Neg Hx   . Liver disease Neg Hx     Past Surgical History:  Procedure Laterality Date  . ABLATION  12/2018   leg veins  . ABLATION Right 08/2019   venous leg   . APPENDECTOMY    . COLON SURGERY     Perforated colon repair after colostomy  . GANGLION CYST EXCISION    . HYSTEROSCOPY WITH D & C N/A 01/13/2014   Procedure: DILATATION AND CURETTAGE /HYSTEROSCOPY with resection ;  Surgeon: Olga Millers, MD;  Location: [facility];  Service: Gynecology;  Laterality: N/A;  . LAPAROTOMY    . PORTACATH PLACEMENT N/A 09/13/2020   Procedure: INSERTION PORT-A-CATH WITH ULTRASOUND GUIDANCE;  Surgeon: Rolm Bookbinder, MD;   Location: [facility];  Service: General;  Laterality: N/A;   Social History   Occupational History  . Not on file  Tobacco Use  . Smoking status: Former Smoker    Packs/day: 0.50    Years: 5.00    Pack years: 2.50    Types: Cigarettes    Start date: 08/06/2006    Quit date: 08/07/2011    Years since quitting: 9.6  . Smokeless tobacco: Never Used  Vaping Use  . Vaping Use: Never used  Substance and Sexual Activity  . Alcohol use: Yes    Comment: occasionally  . Drug use: No  . Sexual activity: Yes    Birth control/protection: Pill

## 2021-03-23 ENCOUNTER — Other Ambulatory Visit: Payer: Self-pay | Admitting: Oncology

## 2021-03-24 ENCOUNTER — Other Ambulatory Visit: Payer: Self-pay | Admitting: Physician Assistant

## 2021-03-26 NOTE — Telephone Encounter (Signed)
Last Visit: 12/28/2020 Next Visit: 05/29/2021 Labs: 03/08/2021, WBC 2.6, RBC 3.78, Neutro Abs 1.3, CA 98.3, Prothrombin 21.7, INR 1.9, Glucose 111, Creatinine 1.01,  Eye exam: 01/19/2021  Current Dose per office note 12/28/2020, 200 mg 1 tablet by mouth twice daily M-F  DX:  Autoimmune disease   Last Fill: 12/26/2020  Okay to refill Plaquenil?

## 2021-03-26 NOTE — Telephone Encounter (Signed)
I called patient.  Her white cell count is low.  I have advised her to discuss with her oncologist if she needs to be on Plaquenil while she is on other immunosuppressive therapy.  When she gets clearance from the oncologist we may be able to give her low-dose Plaquenil at 1 tablet p.o. daily Monday to Friday.  Patient will call us back after she discusses with the oncologist.

## 2021-03-27 ENCOUNTER — Other Ambulatory Visit: Payer: Self-pay

## 2021-03-27 ENCOUNTER — Ambulatory Visit (INDEPENDENT_AMBULATORY_CARE_PROVIDER_SITE_OTHER): Payer: 59 | Admitting: Family Medicine

## 2021-03-27 DIAGNOSIS — M1712 Unilateral primary osteoarthritis, left knee: Secondary | ICD-10-CM | POA: Diagnosis not present

## 2021-03-27 DIAGNOSIS — M1711 Unilateral primary osteoarthritis, right knee: Secondary | ICD-10-CM | POA: Diagnosis not present

## 2021-03-27 NOTE — Progress Notes (Signed)
Office Visit Note   Patient: Erica Wood           Date of Birth: 1966-03-20           MRN: [id number] Visit Date: 03/27/2021 Requested by: Eunice Blase, MD [address] [place],  [place] [address] PCP: Eunice Blase, MD  Subjective: Chief Complaint  Patient presents with  . Right Knee - Follow-up    Bilateral Euflexxa injections #3  . Left Knee - Follow-up    HPI: She is here for Euflexxa No. 3 for bilateral knee DJD.  Not a lot of improvement so far but not any worse.                ROS:   All other systems were reviewed and are negative.  Objective: Vital Signs: There were no vitals taken for this visit.  Physical Exam:  General:  Alert and oriented, in no acute distress. Pulm:  Breathing unlabored. Psy:  Normal mood, congruent affect.  Knees: Trace effusions bilaterally.  No warmth or erythema.  Imaging: No results found.  Assessment & Plan: 1.  Bilateral knee osteoarthritis - Injections today.  If she gets no relief, then physical therapy referral.     Procedures: Bilateral knee injections: After sterile prep with Betadine, injected 3 cc 1% lidocaine without epinephrine and Euflexxa from lateral midpatellar approach.       PMFS History: Patient Active Problem List   Diagnosis Date Noted  . Liver metastases ([place]) 01/03/2021  . Port-A-Cath in place 11/02/2020  . Aortic atherosclerosis ([place]) 09/21/2020  . Malignant neoplasm of lower-outer quadrant of right breast of female, estrogen receptor positive ([place]) 08/10/2020  . Abnormal cervical Papanicolaou smear 07/12/2020  . Pulmonary embolism ([place]) 07/12/2020  . Fibrocystic disease of breast 01/22/2018  . Primary osteoarthritis of both knees 05/02/2017  . Raynaud's disease without gangrene 11/26/2016  . 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 Anticardiolipin antibody positive 11/26/2016  . Autoimmune disease ([place]) 11/25/2016  . High risk medication use 11/25/2016  . Primary osteoarthritis of both hands  11/25/2016  . History of DVT (deep vein thrombosis) 11/25/2016  . History of pulmonary embolism 11/25/2016  . Primary insomnia 11/25/2016  . Anxiety 11/25/2016  . Irritable bowel syndrome with constipation 11/25/2016  . Raynaud's phenomenon 01/03/2016  . Varicose veins of lower extremities with other complications 25/03/3975  . Spider veins of both lower extremities 05/24/2014   Past Medical History:  Diagnosis Date  . Anti-cardiolipin antibody positive   . Anti-cardiolipin antibody syndrome (HCC)   . Anxiety   . Arthralgia   . Arthritis    bil knees, hands  . Atypical chest pain   . Autoimmune disease ([place])   . Breast cancer metastasized to liver ([place]) 09/2020  . Cancer ([place]) 07/2020   right breast IDC  . Chronic fatigue   . Depression   . DVT (deep venous thrombosis) ([place])   . IBS (irritable bowel syndrome)    with constipation  . Lupus ([place])   . Pulmonary embolus ([place])   . Raynaud's syndrome   . Sicca ([employer])   . Thrombocytopenia ([place])     Family History  Problem Relation Age of Onset  . Rheum arthritis Father   . Hemachromatosis Father   . Cancer Sister        bone   . Colon cancer Neg Hx   . Stomach cancer Neg Hx   . Esophageal cancer Neg Hx   . Pancreatic cancer Neg Hx   . Liver  disease Neg Hx     Past Surgical History:  Procedure Laterality Date  . ABLATION  12/2018   leg veins  . ABLATION Right 08/2019   venous leg   . APPENDECTOMY    . COLON SURGERY     Perforated colon repair after colostomy  . GANGLION CYST EXCISION    . HYSTEROSCOPY WITH D & C N/A 01/13/2014   Procedure: DILATATION AND CURETTAGE /HYSTEROSCOPY with resection ;  Surgeon: Olga Millers, MD;  Location: [facility];  Service: Gynecology;  Laterality: N/A;  . LAPAROTOMY    . PORTACATH PLACEMENT N/A 09/13/2020   Procedure: INSERTION PORT-A-CATH WITH ULTRASOUND GUIDANCE;  Surgeon: Rolm Bookbinder, MD;  Location: [facility];  Service: General;  Laterality: N/A;   Social  History   Occupational History  . Not on file  Tobacco Use  . Smoking status: Former Smoker    Packs/day: 0.50    Years: 5.00    Pack years: 2.50    Types: Cigarettes    Start date: 08/06/2006    Quit date: 08/07/2011    Years since quitting: 9.6  . Smokeless tobacco: Never Used  Vaping Use  . Vaping Use: Never used  Substance and Sexual Activity  . Alcohol use: Yes    Comment: occasionally  . Drug use: No  . Sexual activity: Yes    Birth control/protection: Pill

## 2021-03-28 ENCOUNTER — Other Ambulatory Visit: Payer: 59

## 2021-03-28 ENCOUNTER — Inpatient Hospital Stay: Payer: 59 | Attending: Oncology

## 2021-03-28 ENCOUNTER — Inpatient Hospital Stay: Payer: 59

## 2021-03-28 VITALS — BP 120/63 | HR 69 | Temp 98.2°F | Resp 18 | Wt 114.0 lb

## 2021-03-28 DIAGNOSIS — Z7901 Long term (current) use of anticoagulants: Secondary | ICD-10-CM | POA: Diagnosis not present

## 2021-03-28 DIAGNOSIS — Z170 Estrogen receptor positive status [ER+]: Secondary | ICD-10-CM

## 2021-03-28 DIAGNOSIS — C50511 Malignant neoplasm of lower-outer quadrant of right female breast: Secondary | ICD-10-CM

## 2021-03-28 DIAGNOSIS — Z5112 Encounter for antineoplastic immunotherapy: Secondary | ICD-10-CM | POA: Insufficient documentation

## 2021-03-28 DIAGNOSIS — C787 Secondary malignant neoplasm of liver and intrahepatic bile duct: Secondary | ICD-10-CM | POA: Insufficient documentation

## 2021-03-28 DIAGNOSIS — Z86718 Personal history of other venous thrombosis and embolism: Secondary | ICD-10-CM | POA: Diagnosis not present

## 2021-03-28 LAB — CBC WITH DIFFERENTIAL/PLATELET
Abs Immature Granulocytes: 0 10*3/uL (ref 0.00–0.07)
Basophils Absolute: 0.1 10*3/uL (ref 0.0–0.1)
Basophils Relative: 3 %
Eosinophils Absolute: 0 10*3/uL (ref 0.0–0.5)
Eosinophils Relative: 0 %
HCT: 37.4 % (ref 36.0–46.0)
Hemoglobin: 12.4 g/dL (ref 12.0–15.0)
Immature Granulocytes: 0 %
Lymphocytes Relative: 58 %
Lymphs Abs: 1.4 10*3/uL (ref 0.7–4.0)
MCH: 32.5 pg (ref 26.0–34.0)
MCHC: 33.2 g/dL (ref 30.0–36.0)
MCV: 97.9 fL (ref 80.0–100.0)
Monocytes Absolute: 0.4 10*3/uL (ref 0.1–1.0)
Monocytes Relative: 15 %
Neutro Abs: 0.6 10*3/uL — ABNORMAL LOW (ref 1.7–7.7)
Neutrophils Relative %: 24 %
Platelets: 192 10*3/uL (ref 150–400)
RBC: 3.82 MIL/uL — ABNORMAL LOW (ref 3.87–5.11)
RDW: 16 % — ABNORMAL HIGH (ref 11.5–15.5)
WBC: 2.5 10*3/uL — ABNORMAL LOW (ref 4.0–10.5)
nRBC: 0 % (ref 0.0–0.2)

## 2021-03-28 LAB — COMPREHENSIVE METABOLIC PANEL
ALT: 13 U/L (ref 0–44)
AST: 20 U/L (ref 15–41)
Albumin: 4 g/dL (ref 3.5–5.0)
Alkaline Phosphatase: 73 U/L (ref 38–126)
Anion gap: 9 (ref 5–15)
BUN: 8 mg/dL (ref 6–20)
CO2: 29 mmol/L (ref 22–32)
Calcium: 9.1 mg/dL (ref 8.9–10.3)
Chloride: 103 mmol/L (ref 98–111)
Creatinine, Ser: 0.97 mg/dL (ref 0.44–1.00)
GFR, Estimated: >60 mL/min (ref >60)
Glucose, Bld: 75 mg/dL (ref 70–99)
Potassium: 4 mmol/L (ref 3.5–5.1)
Sodium: 141 mmol/L (ref 135–145)
Total Bilirubin: 0.3 mg/dL (ref 0.3–1.2)
Total Protein: 6.7 g/dL (ref 6.5–8.1)

## 2021-03-28 LAB — PROTIME-INR
INR: 1.8 — ABNORMAL HIGH (ref 0.8–1.2)
Prothrombin Time: 20.5 seconds — ABNORMAL HIGH (ref 11.4–15.2)

## 2021-03-28 MED ORDER — TRASTUZUMAB-ANNS CHEMO 150 MG IV SOLR
6.0000 mg/kg | Freq: Once | INTRAVENOUS | Status: AC
  Administered 2021-03-28: 315 mg via INTRAVENOUS
  Filled 2021-03-28: qty 15

## 2021-03-28 MED ORDER — HEPARIN SOD (PORK) LOCK FLUSH 100 UNIT/ML IV SOLN
500.0000 [IU] | Freq: Once | INTRAVENOUS | Status: AC | PRN
  Administered 2021-03-28: 500 [IU]
  Filled 2021-03-28: qty 5

## 2021-03-28 MED ORDER — SODIUM CHLORIDE 0.9% FLUSH
10.0000 mL | INTRAVENOUS | Status: DC | PRN
Start: 2021-03-28 — End: 2021-03-28
  Administered 2021-03-28: 10 mL
  Filled 2021-03-28: qty 10

## 2021-03-28 MED ORDER — ACETAMINOPHEN 325 MG PO TABS
ORAL_TABLET | ORAL | Status: AC
  Filled 2021-03-28: qty 2

## 2021-03-28 MED ORDER — SODIUM CHLORIDE 0.9 % IV SOLN
Freq: Once | INTRAVENOUS | Status: AC
  Filled 2021-03-28: qty 250

## 2021-03-28 MED ORDER — ACETAMINOPHEN 325 MG PO TABS
650.0000 mg | ORAL_TABLET | Freq: Once | ORAL | Status: AC
Start: 2021-03-28 — End: 2021-03-28
  Administered 2021-03-28: 650 mg via ORAL

## 2021-03-28 NOTE — Patient Instructions (Signed)
[facility]  Discharge Instructions: Thank you for choosing [facility] to provide your oncology and hematology care.   If you have a lab appointment with the Cancer Center, please go directly to the Cancer Center and check in at the registration area.   Wear comfortable clothing and clothing appropriate for easy access to any Portacath or PICC line.   We strive to give you quality time with your provider. You may need to reschedule your appointment if you arrive late (15 or more minutes).  Arriving late affects you and other patients whose appointments are after yours.  Also, if you miss three or more appointments without notifying the office, you may be dismissed from the clinic at the provider's discretion.      For prescription refill requests, have your pharmacy contact our office and allow 72 hours for refills to be completed.    Today you received the following chemotherapy and/or immunotherapy agents trastuzumab      To help prevent nausea and vomiting after your treatment, we encourage you to take your nausea medication as directed.  BELOW ARE SYMPTOMS THAT SHOULD BE REPORTED IMMEDIATELY: *FEVER GREATER THAN 100.4 F (38 C) OR HIGHER *CHILLS OR SWEATING *NAUSEA AND VOMITING THAT IS NOT CONTROLLED WITH YOUR NAUSEA MEDICATION *UNUSUAL SHORTNESS OF BREATH *UNUSUAL BRUISING OR BLEEDING *URINARY PROBLEMS (pain or burning when urinating, or frequent urination) *BOWEL PROBLEMS (unusual diarrhea, constipation, pain near the anus) TENDERNESS IN MOUTH AND THROAT WITH OR WITHOUT PRESENCE OF ULCERS (sore throat, sores in mouth, or a toothache) UNUSUAL RASH, SWELLING OR PAIN  UNUSUAL VAGINAL DISCHARGE OR ITCHING   Items with * indicate a potential emergency and should be followed up as soon as possible or go to the Emergency Department if any problems should occur.  Please show the CHEMOTHERAPY ALERT CARD or IMMUNOTHERAPY ALERT CARD at check-in to  the Emergency Department and triage nurse.  Should you have questions after your visit or need to cancel or reschedule your appointment, please contact [facility]  Dept: [phone]  and follow the prompts.  Office hours are 8:00 a.m. to 4:30 p.m. Monday - Friday. Please note that voicemails left after 4:00 p.m. may not be returned until the following business day.  We are closed weekends and major holidays. You have access to a nurse at all times for urgent questions. Please call the main number to the clinic Dept: [phone] and follow the prompts.   For any non-urgent questions, you may also contact your provider using MyChart. We now offer e-Visits for anyone 18 and older to request care online for non-urgent symptoms. For details visit [email or web address].   Also download the MyChart app! Go to the app store, search "MyChart", open the app, select [facility], and log in with your MyChart username and password.  Due to Covid, a mask is required upon entering the hospital/clinic. If you do not have a mask, one will be given to you upon arrival. For doctor visits, patients may have 1 support person aged 18 or older with them. For treatment visits, patients cannot have anyone with them due to current Covid guidelines and our immunocompromised population.   

## 2021-03-29 LAB — CANCER ANTIGEN 27.29: CA 27.29: 86.2 U/mL — ABNORMAL HIGH (ref 0.0–38.6)

## 2021-03-30 ENCOUNTER — Other Ambulatory Visit: Payer: Self-pay | Admitting: *Deleted

## 2021-03-30 ENCOUNTER — Encounter: Payer: Self-pay | Admitting: Family Medicine

## 2021-03-30 ENCOUNTER — Other Ambulatory Visit: Payer: Self-pay | Admitting: Family Medicine

## 2021-03-30 MED ORDER — ENOXAPARIN SODIUM 30 MG/0.3ML IJ SOSY: 30.0000 mg | PREFILLED_SYRINGE | INTRAMUSCULAR | 0 refills | Status: DC

## 2021-04-02 ENCOUNTER — Telehealth: Payer: Self-pay | Admitting: *Deleted

## 2021-04-02 ENCOUNTER — Telehealth: Payer: Self-pay

## 2021-04-02 ENCOUNTER — Encounter: Payer: Self-pay | Admitting: Family Medicine

## 2021-04-02 NOTE — Telephone Encounter (Signed)
This RN spoke with pt per INR level- verified that Dr Eunice Blase is monitoring and advising ( we just draw due to frequent lab visits ).  She stated he is the attending MD for the INR.  This RN advised her to contact his office per above as well as this RN will forward INR to Dr Junius Roads.  No further needs at this time.

## 2021-04-02 NOTE — Telephone Encounter (Signed)
Left message for patient to call back.  FM:[id number]

## 2021-04-05 ENCOUNTER — Encounter: Payer: Self-pay | Admitting: Oncology

## 2021-04-05 NOTE — Telephone Encounter (Signed)
No entry 

## 2021-04-10 ENCOUNTER — Other Ambulatory Visit: Payer: Self-pay | Admitting: Rheumatology

## 2021-04-10 ENCOUNTER — Encounter: Payer: Self-pay | Admitting: Family Medicine

## 2021-04-10 MED ORDER — AMPHETAMINE-DEXTROAMPHET ER 20 MG PO CP24: 20.0000 mg | ORAL_CAPSULE | Freq: Every day | ORAL | 0 refills | Status: DC

## 2021-04-10 NOTE — Telephone Encounter (Signed)
Patient request refill on Adderall sent to [facility] on [address].

## 2021-04-10 NOTE — Telephone Encounter (Signed)
Next Visit: 05/29/2021  Last Visit: 12/28/2020  Last Fill: 03/07/2021  Okay to refill Adderall?

## 2021-04-13 NOTE — Telephone Encounter (Signed)
I called the patient.  She states that her oncologist told her to stay off Plaquenil as she is on chemotherapy currently.

## 2021-04-18 ENCOUNTER — Other Ambulatory Visit: Payer: Self-pay

## 2021-04-18 ENCOUNTER — Other Ambulatory Visit: Payer: 59

## 2021-04-18 ENCOUNTER — Inpatient Hospital Stay: Payer: 59 | Attending: Oncology

## 2021-04-18 ENCOUNTER — Inpatient Hospital Stay: Payer: 59

## 2021-04-18 VITALS — BP 120/70 | HR 65 | Temp 98.2°F | Resp 17 | Wt 115.5 lb

## 2021-04-18 DIAGNOSIS — Z87891 Personal history of nicotine dependence: Secondary | ICD-10-CM | POA: Diagnosis not present

## 2021-04-18 DIAGNOSIS — Z86718 Personal history of other venous thrombosis and embolism: Secondary | ICD-10-CM | POA: Insufficient documentation

## 2021-04-18 DIAGNOSIS — Z5112 Encounter for antineoplastic immunotherapy: Secondary | ICD-10-CM | POA: Insufficient documentation

## 2021-04-18 DIAGNOSIS — C787 Secondary malignant neoplasm of liver and intrahepatic bile duct: Secondary | ICD-10-CM

## 2021-04-18 DIAGNOSIS — Z8261 Family history of arthritis: Secondary | ICD-10-CM | POA: Diagnosis not present

## 2021-04-18 DIAGNOSIS — C50511 Malignant neoplasm of lower-outer quadrant of right female breast: Secondary | ICD-10-CM | POA: Insufficient documentation

## 2021-04-18 DIAGNOSIS — Z808 Family history of malignant neoplasm of other organs or systems: Secondary | ICD-10-CM | POA: Diagnosis not present

## 2021-04-18 DIAGNOSIS — Z832 Family history of diseases of the blood and blood-forming organs and certain disorders involving the immune mechanism: Secondary | ICD-10-CM | POA: Insufficient documentation

## 2021-04-18 DIAGNOSIS — Z170 Estrogen receptor positive status [ER+]: Secondary | ICD-10-CM

## 2021-04-18 DIAGNOSIS — Z79899 Other long term (current) drug therapy: Secondary | ICD-10-CM | POA: Diagnosis not present

## 2021-04-18 LAB — CBC WITH DIFFERENTIAL/PLATELET
Abs Immature Granulocytes: 0.01 10*3/uL (ref 0.00–0.07)
Basophils Absolute: 0.1 10*3/uL (ref 0.0–0.1)
Basophils Relative: 2 %
Eosinophils Absolute: 0 10*3/uL (ref 0.0–0.5)
Eosinophils Relative: 0 %
HCT: 34.9 % — ABNORMAL LOW (ref 36.0–46.0)
Hemoglobin: 12 g/dL (ref 12.0–15.0)
Immature Granulocytes: 0 %
Lymphocytes Relative: 44 %
Lymphs Abs: 1.5 10*3/uL (ref 0.7–4.0)
MCH: 34.1 pg — ABNORMAL HIGH (ref 26.0–34.0)
MCHC: 34.4 g/dL (ref 30.0–36.0)
MCV: 99.1 fL (ref 80.0–100.0)
Monocytes Absolute: 0.2 10*3/uL (ref 0.1–1.0)
Monocytes Relative: 5 %
Neutro Abs: 1.7 10*3/uL (ref 1.7–7.7)
Neutrophils Relative %: 49 %
Platelets: 120 10*3/uL — ABNORMAL LOW (ref 150–400)
RBC: 3.52 MIL/uL — ABNORMAL LOW (ref 3.87–5.11)
RDW: 15.7 % — ABNORMAL HIGH (ref 11.5–15.5)
WBC: 3.4 10*3/uL — ABNORMAL LOW (ref 4.0–10.5)
nRBC: 0 % (ref 0.0–0.2)

## 2021-04-18 LAB — PROTIME-INR
INR: 0.9 (ref 0.8–1.2)
Prothrombin Time: 12.5 seconds (ref 11.4–15.2)

## 2021-04-18 LAB — COMPREHENSIVE METABOLIC PANEL
ALT: 11 U/L (ref 0–44)
AST: 15 U/L (ref 15–41)
Albumin: 4 g/dL (ref 3.5–5.0)
Alkaline Phosphatase: 64 U/L (ref 38–126)
Anion gap: 11 (ref 5–15)
BUN: 15 mg/dL (ref 6–20)
CO2: 29 mmol/L (ref 22–32)
Calcium: 9.2 mg/dL (ref 8.9–10.3)
Chloride: 102 mmol/L (ref 98–111)
Creatinine, Ser: 0.98 mg/dL (ref 0.44–1.00)
GFR, Estimated: >60 mL/min (ref >60)
Glucose, Bld: 104 mg/dL — ABNORMAL HIGH (ref 70–99)
Potassium: 3.6 mmol/L (ref 3.5–5.1)
Sodium: 142 mmol/L (ref 135–145)
Total Bilirubin: 0.3 mg/dL (ref 0.3–1.2)
Total Protein: 6.6 g/dL (ref 6.5–8.1)

## 2021-04-18 MED ORDER — SODIUM CHLORIDE 0.9% FLUSH
10.0000 mL | INTRAVENOUS | Status: DC | PRN
  Administered 2021-04-18: 10 mL
  Filled 2021-04-18: qty 10

## 2021-04-18 MED ORDER — HEPARIN SOD (PORK) LOCK FLUSH 100 UNIT/ML IV SOLN
500.0000 [IU] | Freq: Once | INTRAVENOUS | Status: AC | PRN
  Administered 2021-04-18: 500 [IU]
  Filled 2021-04-18: qty 5

## 2021-04-18 MED ORDER — SODIUM CHLORIDE 0.9 % IV SOLN
6.0000 mg/kg | Freq: Once | INTRAVENOUS | Status: AC
  Administered 2021-04-18: 315 mg via INTRAVENOUS
  Filled 2021-04-18: qty 15

## 2021-04-18 MED ORDER — ACETAMINOPHEN 325 MG PO TABS
650.0000 mg | ORAL_TABLET | Freq: Once | ORAL | Status: AC
  Administered 2021-04-18: 650 mg via ORAL

## 2021-04-18 MED ORDER — SODIUM CHLORIDE 0.9 % IV SOLN
Freq: Once | INTRAVENOUS | Status: AC
  Filled 2021-04-18: qty 250

## 2021-04-18 MED ORDER — METOCLOPRAMIDE HCL 5 MG/ML IJ SOLN
INTRAMUSCULAR | Status: AC
  Filled 2021-04-18: qty 2

## 2021-04-18 MED ORDER — ACETAMINOPHEN 325 MG PO TABS
ORAL_TABLET | ORAL | Status: AC
  Filled 2021-04-18: qty 2

## 2021-04-18 MED ORDER — METOCLOPRAMIDE HCL 5 MG/ML IJ SOLN
10.0000 mg | Freq: Once | INTRAMUSCULAR | Status: AC
  Administered 2021-04-18: 10 mg via INTRAVENOUS

## 2021-04-18 NOTE — Progress Notes (Signed)
Patient c/o nausea so received verbal order for reglan per pt request from Dr. Lindi Adie

## 2021-04-19 LAB — CANCER ANTIGEN 27.29: CA 27.29: 69.4 U/mL — ABNORMAL HIGH (ref 0.0–38.6)

## 2021-04-24 ENCOUNTER — Ambulatory Visit (HOSPITAL_COMMUNITY): Payer: 59 | Attending: Internal Medicine

## 2021-04-24 ENCOUNTER — Other Ambulatory Visit: Payer: Self-pay

## 2021-04-24 DIAGNOSIS — Z170 Estrogen receptor positive status [ER+]: Secondary | ICD-10-CM

## 2021-04-24 DIAGNOSIS — C787 Secondary malignant neoplasm of liver and intrahepatic bile duct: Secondary | ICD-10-CM | POA: Insufficient documentation

## 2021-04-24 DIAGNOSIS — Z86711 Personal history of pulmonary embolism: Secondary | ICD-10-CM | POA: Diagnosis not present

## 2021-04-24 DIAGNOSIS — Z0189 Encounter for other specified special examinations: Secondary | ICD-10-CM | POA: Diagnosis not present

## 2021-04-24 DIAGNOSIS — C50511 Malignant neoplasm of lower-outer quadrant of right female breast: Secondary | ICD-10-CM | POA: Insufficient documentation

## 2021-04-24 LAB — ECHOCARDIOGRAM COMPLETE
Area-P 1/2: 3.72 cm2
P 1/2 time: 737 msec
S' Lateral: 3.6 cm

## 2021-04-26 ENCOUNTER — Ambulatory Visit: Payer: 59 | Admitting: Physical Therapy

## 2021-05-01 ENCOUNTER — Ambulatory Visit
Admission: RE | Admit: 2021-05-01 | Discharge: 2021-05-01 | Disposition: A | Discharge status: Home / Self Care | Payer: 59 | Source: Ambulatory Visit | Attending: Oncology | Admitting: Oncology

## 2021-05-01 ENCOUNTER — Other Ambulatory Visit (HOSPITAL_COMMUNITY): Payer: 59

## 2021-05-01 DIAGNOSIS — Z170 Estrogen receptor positive status [ER+]: Secondary | ICD-10-CM

## 2021-05-01 DIAGNOSIS — C787 Secondary malignant neoplasm of liver and intrahepatic bile duct: Secondary | ICD-10-CM

## 2021-05-01 DIAGNOSIS — Z86711 Personal history of pulmonary embolism: Secondary | ICD-10-CM

## 2021-05-01 DIAGNOSIS — C50511 Malignant neoplasm of lower-outer quadrant of right female breast: Secondary | ICD-10-CM

## 2021-05-01 MED ORDER — GADOBUTROL 1 MMOL/ML IV SOLN
5.0000 mL | Freq: Once | INTRAVENOUS | Status: AC | PRN
  Administered 2021-05-01: 5 mL via INTRAVENOUS

## 2021-05-02 ENCOUNTER — Encounter: Payer: 59 | Admitting: Physical Therapy

## 2021-05-08 ENCOUNTER — Other Ambulatory Visit: Payer: Self-pay | Admitting: *Deleted

## 2021-05-08 ENCOUNTER — Encounter: Payer: 59 | Admitting: Physical Therapy

## 2021-05-08 MED ORDER — AMPHETAMINE-DEXTROAMPHET ER 20 MG PO CP24: 20.0000 mg | ORAL_CAPSULE | Freq: Every day | ORAL | 0 refills | Status: DC

## 2021-05-08 NOTE — Telephone Encounter (Signed)
Patient requested refill on Adderall  Next Visit: 05/29/2021   Last Visit: 12/28/2020   Last Fill: 04/10/2021  Okay to refill Adderall?

## 2021-05-08 NOTE — Progress Notes (Addendum)
[place]  [phone] [phone]     ID: Erica Wood DOB: Mar 16, 1966  MR#: [id number]  RCV#:[id number]  Patient Care Team: Eunice Blase, MD as PCP - General (Family Medicine) Prudence Davidson, Ventura Bing, MD as Referring Physician (Oncology) Rockwell Germany, RN as Oncology Nurse Navigator Mauro Kaufmann, RN as Oncology Nurse Navigator Rolm Bookbinder, MD as Consulting Physician (General Surgery) Magrinat, Virgie Dad, MD as Consulting Physician (Oncology) Kyung Rudd, MD as Consulting Physician (Radiation Oncology) Bo Merino, MD as Consulting Physician (Rheumatology) Olga Millers, MD as Consulting Physician (Obstetrics and Gynecology) Larey Dresser, MD as Consulting Physician (Cardiology) Armbruster, Carlota Raspberry, MD as Consulting Physician (Gastroenterology) Nena Jordan, MD as Referring Physician (Ophthalmology) Nena Jordan, MD as Referring Physician (Ophthalmology) Chauncey Cruel, MD OTHER MD:  CHIEF COMPLAINT: triple positive stage IV breast cancer  CURRENT TREATMENT: herceptin; anastrozole, palbociclib; lovenox   INTERVAL HISTORY: Erica Wood returns today for follow up and treatment of her triple positive breast cancer.   Since her last visit, she underwent breast MRI on 05/01/2021 showing: breast composition D; additional positive response to neoadjuvant chemotherapy-- index malignancy in inferior right breast currently 6 mm, interval decrease in the two medial right breast lesions, no significant change in upper-outer right breast mass; no new masses or areas of abnormal enhancement.  She continues on trastuzumab (indefinitely), repeated every 21 days.  She had been interested in trying the subcutaneous form but we have not been able to get approval from insurance to give that a try--she would have to "fail" 3 bio similars before they would approve it.  At some point however the subcutaneous version will become  generic and likely they will approve it at that time.  She started anastrozole and palbociclib on 01/29/2021.  She is tolerating the anastrozole with occasional hot flashes and occasional chills.  There are continuing vaginal dryness issues and she participated in our pelvic physical therapy program on 03/13/2021.  She has had no worsening fatigue and no nausea from the palbociclib.  She also underwent repeat echocardiogram on 04/24/2021 showed an ejection fraction of 60-65%. This is stable from prior.  She is scheduled for bone density screening tomorrow, 05/10/2021.  Lab Results  Component Value Date   CA2729 69.4 (H) 04/18/2021   CA2729 86.2 (H) 03/28/2021   CA2729 98.3 (H) 03/08/2021   CA2729 95.6 (H) 02/14/2021   CA2729 91.9 (H) 01/03/2021    REVIEW OF SYSTEMS: Erica Wood tells me she has quite a bit of pain particularly in her feet but also to some extent in her hands and other joints.  She has significant hot flashes from the anastrozole.  She feels tired.  Nevertheless she is able to work 12 hours on Saturday and Sunday and a half a day on Monday.  She has 4 days off after that.  She is doing some walking for exercise.  She is very worried about her weight.  She would like to be 130 pounds and she is 135 right now.  She continues to have constipation which is a chronic problem for her.  A detailed review of systems today was otherwise stable   COVID 19 VACCINATION STATUS: [place] x3, most recently 08/2020   HISTORY OF CURRENT ILLNESS: From the original intake note:  Erica Wood herself palpated an abnormality in the right breast several years ago.  This had not changed until sometime in June 2021.  She was evaluated by Dr. Ouida Sills and underwent right diagnostic mammography with  tomography and right breast ultrasonography at [facility] on 07/26/2020 showing: breast density category D; 2.6 cm mass in right breast at 6 o'clock, at site of palpable concern; two focal groups of punctate  calcifications posterior to palpable mass; no right axillary adenopathy.  Accordingly on 08/04/2020 she proceeded to biopsy of the right breast area in question. The pathology from this procedure ([id number]) showed: invasive ductal carcinoma, grade 3. Prognostic indicators significant for: estrogen receptor, 95% positive with moderate staining intensity and progesterone receptor, 10% positive with strong staining intensity. Proliferation marker Ki67 at 40%. HER2 equivocal by immunohistochemistry (2+), but positive by fluorescent in situ hybridization with a signals ratio 3.23 and number per cell 6.45.  Biopsy of the posterior calcifications was benign.  She also underwent breast MRI on 08/11/2020 showing: breast composition D; 2.6 cm biopsy-proven malignancy in right breast at 6 o'clock, with probable skin involvement noted on previous ultrasound; additional enhancing masses within right breast-- 5 mm in lower-outer, 1.2 cm in upper-inner, 7 mm in upper-outer; no evidence of left breast malignancy or lymphadenopathy; heterogeneous enhancement of sternum, of uncertain significance.  The patient's subsequent history is as detailed below.   PAST MEDICAL HISTORY: Past Medical History:  Diagnosis Date   Anti-cardiolipin antibody positive    Anti-cardiolipin antibody syndrome (HCC)    Anxiety    Arthralgia    Arthritis    bil knees, hands   Atypical chest pain    Autoimmune disease ([place])    Breast cancer metastasized to liver ([place]) 09/2020   Cancer ([place]) 07/2020   right breast IDC   Chronic fatigue    Depression    DVT (deep venous thrombosis) (HCC)    IBS (irritable bowel syndrome)    with constipation   Lupus ([place])    Pulmonary embolus (HCC)    Raynaud's syndrome    Sicca ([place])    Thrombocytopenia ([place])     PAST SURGICAL HISTORY: Past Surgical History:  Procedure Laterality Date   ABLATION  12/2018   leg veins   ABLATION Right 08/2019   venous leg    APPENDECTOMY     COLON  SURGERY     Perforated colon repair after colostomy   GANGLION CYST EXCISION     HYSTEROSCOPY WITH D & C N/A 01/13/2014   Procedure: DILATATION AND CURETTAGE /HYSTEROSCOPY with resection ;  Surgeon: Olga Millers, MD;  Location: [facility];  Service: Gynecology;  Laterality: N/A;   LAPAROTOMY     PORTACATH PLACEMENT N/A 09/13/2020   Procedure: INSERTION PORT-A-CATH WITH ULTRASOUND GUIDANCE;  Surgeon: Rolm Bookbinder, MD;  Location: [facility];  Service: General;  Laterality: N/A;    FAMILY HISTORY: Family History  Problem Relation Age of Onset   Rheum arthritis Father    Hemachromatosis Father    Cancer Sister        bone    Colon cancer Neg Hx    Stomach cancer Neg Hx    Esophageal cancer Neg Hx    Pancreatic cancer Neg Hx    Liver disease Neg Hx   as of October 2021 her father isage 29 and her mother age 44.. The patient had one sister (and no brothers). The sister was diagnosed with Ewing's sarcoma at age 59, and died from the disease after many difficult treatments.    GYNECOLOGIC HISTORY:  Last menstrual period occurred about 6 months prior to the start of chemotherapy Menarche: 55 or 56 years old GX P 0 LMP around 2019 Contraceptive:  yes HRT no  Hysterectomy? no BSO? 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 She is widowed. She lives at home with her cat, Mariea Clonts. She is not a Ambulance person.    ADVANCED DIRECTIVES: not in place; at the 08/16/2020 visit the patient was given the appropriate documents to complete and notarized at her discretion.     HEALTH MAINTENANCE: Social History   Tobacco Use   Smoking status: Former    Packs/day: 0.50    Years: 5.00    Pack years: 2.50    Types: Cigarettes    Start date: 08/06/2006    Quit date: 08/07/2011    Years since quitting: 9.7   Smokeless tobacco: Never  Vaping Use   Vaping Use: Never used  Substance Use Topics   Alcohol use: Yes    Comment:  occasionally   Drug use: No     Colonoscopy: 2002 for perforated bowel  PAP: 03/2020  Bone density: done at [facility] office   Allergies  Allergen Reactions   Beef-Derived Products Hives   Latex Rash    Contact   Sulfa Antibiotics Rash    Current Outpatient Medications  Medication Sig Dispense Refill   hydroxychloroquine (PLAQUENIL) 200 MG tablet Take 1 tablet (200 mg total) by mouth daily. 90 tablet 4   metoCLOPramide (REGLAN) 10 MG tablet Take 0.5-1 tablets (5-10 mg total) by mouth 4 (four) times daily.     venlafaxine XR (EFFEXOR-XR) 37.5 MG 24 hr capsule Take 1 capsule (37.5 mg total) by mouth daily with breakfast. 30 capsule 3   warfarin (COUMADIN) 5 MG tablet Take 1 tablet (5 mg total) by mouth daily. 90 tablet 4   acetaminophen (TYLENOL) 500 MG tablet Take 1,000 mg by mouth every 6 (six) hours as needed for moderate pain or headache.     amphetamine-dextroamphetamine (ADDERALL XR) 20 MG 24 hr capsule Take 1 capsule (20 mg total) by mouth daily. 30 capsule 0   anastrozole (ARIMIDEX) 1 MG tablet Take 1 tablet (1 mg total) by mouth daily. 90 tablet 4   Ascorbic Acid (VITAMIN C PO) Take 3,000 mg by mouth daily.      CAMILA 0.35 MG tablet Take 1 tablet by mouth daily.  11   Cholecalciferol (VITAMIN D3) 75 MCG (3000 UT) TABS Take 3,000 Units by mouth daily.     palbociclib (IBRANCE) 100 MG tablet Take 1 tablet (100 mg total) by mouth daily. Take for 21 days on, 7 days off, repeat every 28 days. 21 tablet 6   Prucalopride Succinate (MOTEGRITY) 2 MG TABS Take 1 tablet (2 mg total) by mouth daily. 14 tablet 0   RESTASIS MULTIDOSE 0.05 % ophthalmic emulsion Place 1 drop into both eyes 2 (two) times daily. 11 mL 5   spironolactone (ALDACTONE) 25 MG tablet Take 25 mg by mouth daily as needed (swelling).     traMADol (ULTRAM) 50 MG tablet Take 1-2 tablets (50-100 mg total) by mouth every 6 (six) hours as needed. (Patient taking differently: Take 50-100 mg by mouth every 6 (six) hours as  needed for moderate pain.) 60 tablet 0   tretinoin (RETIN-A) 0.1 % cream Apply 1 application topically at bedtime as needed (acne).      triamterene-hydrochlorothiazide (DYAZIDE) 37.5-25 MG capsule TAKE 1 CAPSULE BY MOUTH DAILY AS NEEDED FOR SWELLING 90 capsule 0   No current facility-administered medications for this visit.   Facility-Administered Medications Ordered in Other Visits  Medication Dose  Route Frequency Provider Last Rate Last Admin   0.9 %  sodium chloride infusion   Intravenous Once Magrinat, Virgie Dad, MD       acetaminophen (TYLENOL) tablet 650 mg  650 mg Oral Once Magrinat, Virgie Dad, MD       heparin lock flush 100 unit/mL  500 Units Intracatheter Once PRN Magrinat, Virgie Dad, MD       sodium chloride flush (NS) 0.9 % injection 10 mL  10 mL Intracatheter PRN Magrinat, Virgie Dad, MD       trastuzumab-anns [facility]) 315 mg in sodium chloride 0.9 % 250 mL chemo infusion  6 mg/kg (Treatment Plan Recorded) Intravenous Once Magrinat, Virgie Dad, MD        OBJECTIVE: White woman who appears stated age  Vitals:   05/09/21 0830  BP: 114/69  Pulse: 70  Resp: 17  Temp: 97.9 F (36.6 C)  SpO2: 100%     Body mass index is 19.89 kg/m.   Wt Readings from Last 3 Encounters:  05/09/21 115 lb 14.4 oz (52.6 kg)  04/18/21 115 lb 8 oz (52.4 kg)  03/28/21 114 lb (51.7 kg)      ECOG FS:2 - Symptomatic, <50% confined to bed  Sclerae unicteric, EOMs intact Wearing a mask No cervical or supraclavicular adenopathy Lungs no rales or rhonchi Heart regular rate and rhythm Abd soft, nontender, positive bowel sounds MSK no focal spinal tenderness, no upper extremity lymphedema Neuro: nonfocal, well oriented, appropriate affect Breasts: I do not palpate a mass in the right breast.  The left breast and both axillae are benign.  LAB RESULTS:  CMP     Component Value Date/Time   NA 139 05/09/2021 0806   NA 137 07/28/2020 1108   K 4.6 05/09/2021 0806   CL 103 05/09/2021 0806   CO2 28  05/09/2021 0806   GLUCOSE 106 (H) 05/09/2021 0806   BUN 15 05/09/2021 0806   BUN 8 07/28/2020 1108   CREATININE 1.00 05/09/2021 0806   CREATININE 0.78 10/20/2020 1330   CALCIUM 9.9 05/09/2021 0806   PROT 6.9 05/09/2021 0806   PROT 7.3 07/28/2020 1108   ALBUMIN 3.9 05/09/2021 0806   ALBUMIN 4.9 07/28/2020 1108   AST 18 05/09/2021 0806   AST 19 09/21/2020 0827   ALT 14 05/09/2021 0806   ALT 27 09/21/2020 0827   ALKPHOS 84 05/09/2021 0806   BILITOT 0.5 05/09/2021 0806   BILITOT 0.4 09/21/2020 0827   GFRNONAA >60 05/09/2021 0806   GFRNONAA >60 10/20/2020 1330   GFRAA 95 07/28/2020 1108    No results found for: TOTALPROTELP, ALBUMINELP, A1GS, A2GS, BETS, BETA2SER, GAMS, MSPIKE, SPEI  Lab Results  Component Value Date   WBC 3.2 (L) 05/09/2021   NEUTROABS 1.8 05/09/2021   HGB 13.2 05/09/2021   HCT 38.8 05/09/2021   MCV 100.0 05/09/2021   PLT 261 05/09/2021    No results found for: LABCA2  No components found for: [id number]  Recent Labs  Lab 05/09/21 0806  INR 2.1*    No results found for: LABCA2  No results found for: GGY694  Lab Results  Component Value Date   CAN125 29.9 01/03/2021    No results found for: WNI627  Lab Results  Component Value Date   CA2729 69.4 (H) 04/18/2021    No components found for: HGQUANT  Lab Results  Component Value Date   CEA1 1.88 01/03/2021   /  CEA ([facility])  Date Value Ref Range Status  01/03/2021 1.88 0.00 -  5.00 ng/mL Final    Comment:    (NOTE) This test was performed using Architect's Chemiluminescent Microparticle Immunoassay. Values obtained from different assay methods cannot be used interchangeably. Please note that 5-10% of patients who smoke may see CEA levels up to 6.9 ng/mL. Performed at [facility], [place] [address], [place], [place] [address]      No results found for: AFPTUMOR  No results found for: CHROMOGRNA  No results found for: KPAFRELGTCHN, LAMBDASER,  KAPLAMBRATIO (kappa/lambda light chains)  No results found for: HGBA, HGBA2QUANT, HGBFQUANT, HGBSQUAN (Hemoglobinopathy evaluation)   No results found for: LDH  Lab Results  Component Value Date   IRON 88 10/29/2018   IRON 88 10/29/2018   TIBC 241 (L) 10/29/2018   TIBC 241 (L) 10/29/2018   IRONPCTSAT 37 10/29/2018   IRONPCTSAT 37 10/29/2018   (Iron and TIBC)  Lab Results  Component Value Date   FERRITIN 43 10/29/2018   FERRITIN 43 10/29/2018    Urinalysis    Component Value Date/Time   APPEARANCEUR Clear 02/12/2021 1147   GLUCOSEU Negative 02/12/2021 1147   BILIRUBINUR Negative 02/12/2021 1147   PROTEINUR Negative 02/12/2021 1147   NITRITE Negative 02/12/2021 1147   LEUKOCYTESUR Negative 02/12/2021 1147    STUDIES: MR BREAST BILATERAL W WO CONTRAST INC CAD  Result Date: 05/01/2021 CLINICAL DATA:  Patient with known right breast carcinoma. Assess response to neoadjuvant chemotherapy. Patient also with liver lesions consistent with metastatic disease, assessed with liver MRI on 08/24/2020. On previous breast MRI, patient was found have two additional suspicious masses in the medial right breast, which have not been biopsied. A suspicious mass in the upper outer right breast was biopsied under MRI guidance on 09/05/2020, pathology revealing a sclerotic lesion with focal lobular neoplasia. Patient's most recent prior breast MRI is dated 12/26/2020. LABS:  No labs drawn at time of imaging. EXAM: BILATERAL BREAST MRI WITH AND WITHOUT CONTRAST TECHNIQUE: Multiplanar, multisequence MR images of both breasts were obtained prior to and following the intravenous administration of 5 ml of Gadavist Three-dimensional MR images were rendered by post-processing of the original MR data on an independent workstation. The three-dimensional MR images were interpreted, and findings are reported in the following complete MRI report for this study. Three dimensional images were evaluated at the  independent interpreting workstation using the DynaCAD thin client. COMPARISON:  Prior exams including previous breast MRIs, most recent breast MRI dated 12/26/2020. FINDINGS: Breast composition: d. Extreme fibroglandular tissue. Background parenchymal enhancement: Mild Right breast: Small residual enhancing mass in the inferior right breast, 6 x 4 mm transversely, image 104, series 9, associated with a post biopsy marker clip artifact, reflecting the index malignancy. This measured 8 mm in greatest dimension on the prior exam, 2.6 x 2.2 x 2.5 cm on the pre therapy study. Small enhancing mass in the central, slightly upper inner left breast, image 67, series 9, 4 mm in long axis, previously 7 mm in long axis. Small enhancing mass in the more superior, upper inner right breast, image 61, series 9, 7 x 4 mm, previously 13 x 7 mm. Small enhancing mass in outer right breast, image 62, series 9, with an associated post biopsy marker clip artifact, currently measures 4 mm greatest dimension, without significant change from the most recent prior study. No new breast masses or areas of abnormal enhancement. Only the known breast carcinoma show significant enhancement on the initial post-contrast sequence. Left breast: No mass or abnormal enhancement. Lymph nodes: No abnormal  appearing lymph nodes. Ancillary findings:  None. IMPRESSION: 1. Additional positive response to neoadjuvant chemotherapy. The index malignancy in the inferior right breast currently measures 6 x 4 mm transversely slightly decreased in size from the most recent prior study, markedly decreased from the original, pre therapy exam. The 2 breast lesions noted medial right breast, which were not biopsied, have shown an interval decrease in size and are all only conspicuous the more delayed post gadolinium sequences. A small previously biopsied mass in upper outer right breast is without significant change from the most recent prior exam. 2. No new masses or  areas of abnormal enhancement. RECOMMENDATION: 1. Treatment as planned for right breast malignancy and previously biopsied area right breast lobular neoplasia. The other 2 small enhancing masses in the upper medial right breast can be considered for MRI guided biopsy or alternatively close attention on follow-up MRI surveillance. BI-RADS CATEGORY  6: Known biopsy-proven malignancy. Electronically Signed   By: Lajean Manes M.D.   On: 05/01/2021 12:04  ECHOCARDIOGRAM COMPLETE  Result Date: 04/24/2021    ECHOCARDIOGRAM REPORT   Patient Name:   Erica Wood [facility] Date of Exam: 04/24/2021 Medical Rec #:  [id number]            Height:       64.0 in Accession #:    [id number]           Weight:       115.5 lb Date of Birth:  [birth year]            BSA:          1.549 m Patient Age:    69 years             BP:           124/68 mmHg Patient Gender: F                    HR:           68 bpm. Exam Location:  [address] Procedure: 2D Echo, 3D Echo, Cardiac Doppler, Color Doppler and Strain Analysis Indications:    C50.511 Breast cancer.  History:        Patient has prior history of Echocardiogram examinations, most                 recent 01/10/2021. Risk Factors:Former Smoker. Raynaud's disease.                 Pulmonary embolus. Breast cancer. Liver metastasis. DVT.  Sonographer:    Jessee Avers, RDCS Referring Phys: [place]  1. Left ventricular ejection fraction, by estimation, is 60 to 65%. Left ventricular ejection fraction by 3D volume is 61 %. The left ventricle has normal function. The left ventricle has no regional wall motion abnormalities. Left ventricular diastolic  parameters were normal. The average left ventricular global longitudinal strain is -23.7 %. The global longitudinal strain is normal.  2. Right ventricular systolic function is normal. The right ventricular size is normal. There is normal pulmonary artery systolic pressure. The estimated right ventricular systolic pressure  is 19.5 mmHg.  3. The mitral valve is grossly normal. Trivial mitral valve regurgitation.  4. The aortic valve is tricuspid. Aortic valve regurgitation trivial to mild. No aortic stenosis is present. Aortic regurgitation PHT measures 737 msec.  5. The inferior vena cava is normal in size with greater than 50% respiratory variability, suggesting right atrial pressure of 3 mmHg. Comparison(s): No significant change  from prior study. 01/10/21 EF 60-65%. GLS -22%, mild AI. FINDINGS  Left Ventricle: Left ventricular ejection fraction, by estimation, is 60 to 65%. Left ventricular ejection fraction by 3D volume is 61 %. The left ventricle has normal function. The left ventricle has no regional wall motion abnormalities. The average left ventricular global longitudinal strain is -23.7 %. The global longitudinal strain is normal. The left ventricular internal cavity size was normal in size. There is no left ventricular hypertrophy. Left ventricular diastolic parameters were normal. Right Ventricle: The right ventricular size is normal. No increase in right ventricular wall thickness. Right ventricular systolic function is normal. There is normal pulmonary artery systolic pressure. The tricuspid regurgitant velocity is 2.35 m/s, and  with an assumed right atrial pressure of 3 mmHg, the estimated right ventricular systolic pressure is 10.1 mmHg. Left Atrium: Left atrial size was normal in size. Right Atrium: Right atrial size was normal in size. Pericardium: There is no evidence of pericardial effusion. Mitral Valve: The mitral valve is grossly normal. Trivial mitral valve regurgitation. Tricuspid Valve: The tricuspid valve is grossly normal. Tricuspid valve regurgitation is trivial. Aortic Valve: The aortic valve is tricuspid. Aortic valve regurgitation trivial to mild. Aortic regurgitation PHT measures 737 msec. No aortic stenosis is present. Pulmonic Valve: The pulmonic valve was normal in structure. Pulmonic valve  regurgitation is not visualized. Aorta: The aortic root and ascending aorta are structurally normal, with no evidence of dilitation. Venous: The inferior vena cava is normal in size with greater than 50% respiratory variability, suggesting right atrial pressure of 3 mmHg. IAS/Shunts: No atrial level shunt detected by color flow Doppler.  LEFT VENTRICLE PLAX 2D LVIDd:         5.10 cm         Diastology LVIDs:         3.60 cm         LV e' medial:    9.16 cm/s LV PW:         0.70 cm         LV E/e' medial:  8.8 LV IVS:        0.60 cm         LV e' lateral:   12.10 cm/s LVOT diam:     2.10 cm         LV E/e' lateral: 6.7 LV SV:         86 LV SV Index:   55              2D LVOT Area:     3.46 cm        Longitudinal                                Strain                                2D Strain GLS  -22.2 %                                (A2C):                                2D Strain GLS  -23.8 %                                (  A3C):                                2D Strain GLS  -25.0 %                                (A4C):                                2D Strain GLS  -23.7 %                                Avg:                                 3D Volume EF                                LV 3D EF:    Left                                             ventricular                                             ejection                                             fraction by                                             3D volume                                             is 61 %.                                 3D Volume EF:                                3D EF:        61 %                                LV EDV:       104 ml                                LV ESV:       40 ml  LV SV:        64 ml RIGHT VENTRICLE RV Basal diam:  3.10 cm RV S prime:     11.30 cm/s TAPSE (M-mode): 2.1 cm RVSP:           25.1 mmHg LEFT ATRIUM             Index       RIGHT ATRIUM           Index LA diam:        3.60 cm  2.32 cm/m  RA Pressure: 3.00 mmHg LA Vol (A2C):   46.5 ml 30.02 ml/m RA Area:     11.90 cm LA Vol (A4C):   32.0 ml 20.66 ml/m RA Volume:   26.60 ml  17.17 ml/m LA Biplane Vol: 38.8 ml 25.05 ml/m  AORTIC VALVE LVOT Vmax:   116.00 cm/s LVOT Vmean:  73.700 cm/s LVOT VTI:    0.248 m AI PHT:      737 msec  AORTA Ao Root diam: 3.00 cm Ao Asc diam:  3.30 cm MITRAL VALVE               TRICUSPID VALVE                            TR Peak grad:   22.1 mmHg                            TR Vmax:        235.00 cm/s MV E velocity: 80.50 cm/s  Estimated RAP:  3.00 mmHg MV A velocity: 49.90 cm/s  RVSP:           25.1 mmHg MV E/A ratio:  1.61                            SHUNTS                            Systemic VTI:  0.25 m                            Systemic Diam: 2.10 cm Lyman Bishop MD Electronically signed by Lyman Bishop MD Signature Date/Time: 04/24/2021/3:27:46 PM    Final      ELIGIBLE FOR AVAILABLE RESEARCH PROTOCOL: AET  ASSESSMENT: 55 y.o. [place] woman status post right breast lower outer quadrant biopsy 08/04/2020 for a clinical T2 N0 M1, stage IV invasive ductal carcinoma, grade 3, triple positive, with an MIB-1 of 40%  (a) breast MRI 08/14/2020 shows 3 additional right breast lesions and heterogeneous enhancement of the sternum  (b) biopsy of one of the additional right breast masses 09/05/2020 showed atypical lobular hyperplasia (concordant).  (c) CT scan of the abdomen and pelvis 07/14/2020 shows 2 indeterminate liver lesions  (d) abdominal MRI 08/24/2020 shows multiple liver lesions highly suspicious for liver metastases.  (e) liver biopsy 09/20/2020 positive for carcinoma, prognostic panel estrogen and progesterone receptor positive, but HER-2 negative (0); MIB-1 of 30%  (f) bone scan 09/14/2020 shows no evidence of bone metastases  (g) CT scan of the chest and head 09/14/2020 showed no evidence of metastatic disease in the chest or brain  (h) CA 27-29 was 91.9 on 01/03/2021; CEA and CA-125  normal  (1) neoadjuvant chemotherapy consisting of docetaxel, carboplatin,  trastuzumab and pertuzumab every 21 days x 6 started 08/31/2020, completed 12/14/2020  (a) pertuzumab omitted starting with cycle 4  (b) docetaxel omitted with final cycle, gemzar substituted  (2) anti-HER-2 immunotherapy (trastuzumab) Q 21 d to continue indefinitely  (a) echo 04/12/2020 shows an ejection fraction in the 60-65% range  (b) echo 09/25/2020 shows an ejection fraction in the 60 to 65% range  (c) echo 01/10/2021 shows an ejection fraction in the 60-65% range  (d) echo 04/24/2021 shows an ejection fraction in the 60-65% range  (3) definitive surgery to follow depending on optimal response to systemic therapy  (4) adjuvant radiation to follow surgery as appropriate  (5) anastrozole started 01/29/2021  (a) palbociclib/Ibrance started 01/29/2021  (b) palbociclib dose decreased to 100 mg beginning with May 2022 cycle due to cytopenias  (6) staging studies:  (a) post-NEO chemotherapy breast MRI 12/26/2020 showed right breast index lesion decreased to 0.8 cm; 2 additional masses in the right breast stable  (b) abdominal MRI 01/29/2021 shows no residual measurable disease in the liver  (c) breast MRI 05/01/2021 shows continuing response in the breast  (7) history LLE DVT/ PE 2003: on lifelong anticoagulation (warfarin)   PLAN: Erica Wood is now three fourths of a year out from initial diagnosis of metastatic breast cancer.  Her disease is very well controlled on her current treatment and she is tolerating treatment moderately well.  I think the reason for her pain is that we stopped the Plaquenil because it was lowering her blood counts and interfering with the Ibrance treatment.  I have lowered the Ibrance dose to 100 and her ANC is now acceptable so we are going back to Plaquenil at 200 mg daily.  She also has a follow-up with Dr. Rosana Hoes for in July.  I am delighted that her epiphora has improved after the  surgery at [facility].  She is continuing on the eyedrops for now under the direction of Dr. Benjamine Mola  We are continuing the trastuzumab every 21 days until she completes the year at which point we will go to every 4weeks.  She just had an echo which is very favorable and she will have a repeat echo before her return visit with me in 3 months.  She will also have a liver MRI prior to that visit.  I have encouraged her to use MiraLAX and stool softeners and also Tylenol and tramadol but she is very wary of all these medications.  I am starting her or at least I am prescribing venlafaxine very low-dose to see if that helps with her hot flashes.  She is very concerned that it may cause her to gain even more weight (she is 5 pounds over her ideal weight she says).  Finally she is tolerating warfarin without any bleeding complications.  I will see her again in 3 months, reviewed her echo and abdominal MRI, and consider going to every 4 weeks on the trastuzumab at that time.  She will need a repeat breast MRI before the end of the year  Total encounter time 40 minutes.Sarajane Jews C. Magrinat, MD 05/09/2021 9:12 AM Medical Oncology and Hematology [facility] [place], [place] [address] Tel. [phone]    Fax. [phone]   This document serves as a record of services personally performed by Lurline Del, MD. It was created on his behalf by Wilburn Mylar, a trained medical scribe. The creation of this record is based on the scribe's personal observations and the  provider's statements to them.   I, Lurline Del MD, have reviewed the above documentation for accuracy and completeness, and I agree with the above.   *Total Encounter Time as defined by the Centers for Medicare and Medicaid Services includes, in addition to the face-to-face time of a patient visit (documented in the note above) non-face-to-face time: obtaining and reviewing outside history, ordering and  reviewing medications, tests or procedures, care coordination (communications with other health care professionals or caregivers) and documentation in the medical record.

## 2021-05-09 ENCOUNTER — Inpatient Hospital Stay: Payer: 59

## 2021-05-09 ENCOUNTER — Other Ambulatory Visit: Payer: Self-pay

## 2021-05-09 ENCOUNTER — Inpatient Hospital Stay (HOSPITAL_BASED_OUTPATIENT_CLINIC_OR_DEPARTMENT_OTHER): Payer: 59 | Admitting: Oncology

## 2021-05-09 VITALS — BP 114/69 | HR 70 | Temp 97.9°F | Resp 17 | Wt 115.9 lb

## 2021-05-09 DIAGNOSIS — C787 Secondary malignant neoplasm of liver and intrahepatic bile duct: Secondary | ICD-10-CM

## 2021-05-09 DIAGNOSIS — Z86718 Personal history of other venous thrombosis and embolism: Secondary | ICD-10-CM

## 2021-05-09 DIAGNOSIS — Z170 Estrogen receptor positive status [ER+]: Secondary | ICD-10-CM

## 2021-05-09 DIAGNOSIS — C50511 Malignant neoplasm of lower-outer quadrant of right female breast: Secondary | ICD-10-CM

## 2021-05-09 DIAGNOSIS — L930 Discoid lupus erythematosus: Secondary | ICD-10-CM

## 2021-05-09 DIAGNOSIS — Z7189 Other specified counseling: Secondary | ICD-10-CM | POA: Insufficient documentation

## 2021-05-09 DIAGNOSIS — Z86711 Personal history of pulmonary embolism: Secondary | ICD-10-CM | POA: Diagnosis not present

## 2021-05-09 DIAGNOSIS — Z5112 Encounter for antineoplastic immunotherapy: Secondary | ICD-10-CM | POA: Diagnosis not present

## 2021-05-09 LAB — COMPREHENSIVE METABOLIC PANEL
ALT: 14 U/L (ref 0–44)
AST: 18 U/L (ref 15–41)
Albumin: 3.9 g/dL (ref 3.5–5.0)
Alkaline Phosphatase: 84 U/L (ref 38–126)
Anion gap: 8 (ref 5–15)
BUN: 15 mg/dL (ref 6–20)
CO2: 28 mmol/L (ref 22–32)
Calcium: 9.9 mg/dL (ref 8.9–10.3)
Chloride: 103 mmol/L (ref 98–111)
Creatinine, Ser: 1 mg/dL (ref 0.44–1.00)
GFR, Estimated: >60 mL/min (ref >60)
Glucose, Bld: 106 mg/dL — ABNORMAL HIGH (ref 70–99)
Potassium: 4.6 mmol/L (ref 3.5–5.1)
Sodium: 139 mmol/L (ref 135–145)
Total Bilirubin: 0.5 mg/dL (ref 0.3–1.2)
Total Protein: 6.9 g/dL (ref 6.5–8.1)

## 2021-05-09 LAB — CBC WITH DIFFERENTIAL/PLATELET
Abs Immature Granulocytes: 0.01 10*3/uL (ref 0.00–0.07)
Basophils Absolute: 0.1 10*3/uL (ref 0.0–0.1)
Basophils Relative: 3 %
Eosinophils Absolute: 0 10*3/uL (ref 0.0–0.5)
Eosinophils Relative: 1 %
HCT: 38.8 % (ref 36.0–46.0)
Hemoglobin: 13.2 g/dL (ref 12.0–15.0)
Immature Granulocytes: 0 %
Lymphocytes Relative: 35 %
Lymphs Abs: 1.1 10*3/uL (ref 0.7–4.0)
MCH: 34 pg (ref 26.0–34.0)
MCHC: 34 g/dL (ref 30.0–36.0)
MCV: 100 fL (ref 80.0–100.0)
Monocytes Absolute: 0.2 10*3/uL (ref 0.1–1.0)
Monocytes Relative: 5 %
Neutro Abs: 1.8 10*3/uL (ref 1.7–7.7)
Neutrophils Relative %: 56 %
Platelets: 261 10*3/uL (ref 150–400)
RBC: 3.88 MIL/uL (ref 3.87–5.11)
RDW: 14.6 % (ref 11.5–15.5)
WBC: 3.2 10*3/uL — ABNORMAL LOW (ref 4.0–10.5)
nRBC: 0 % (ref 0.0–0.2)

## 2021-05-09 LAB — PROTIME-INR
INR: 2.1 — ABNORMAL HIGH (ref 0.8–1.2)
Prothrombin Time: 23.7 seconds — ABNORMAL HIGH (ref 11.4–15.2)

## 2021-05-09 MED ORDER — ACETAMINOPHEN 325 MG PO TABS
650.0000 mg | ORAL_TABLET | Freq: Once | ORAL | Status: AC
Start: 2021-05-09 — End: 2021-05-09
  Administered 2021-05-09: 650 mg via ORAL

## 2021-05-09 MED ORDER — HEPARIN SOD (PORK) LOCK FLUSH 100 UNIT/ML IV SOLN
500.0000 [IU] | Freq: Once | INTRAVENOUS | Status: AC | PRN
  Administered 2021-05-09: 500 [IU]
  Filled 2021-05-09: qty 5

## 2021-05-09 MED ORDER — SODIUM CHLORIDE 0.9% FLUSH
10.0000 mL | INTRAVENOUS | Status: DC | PRN
  Administered 2021-05-09: 10 mL
  Filled 2021-05-09: qty 10

## 2021-05-09 MED ORDER — ACETAMINOPHEN 325 MG PO TABS
ORAL_TABLET | ORAL | Status: AC
  Filled 2021-05-09: qty 2

## 2021-05-09 MED ORDER — TRASTUZUMAB-ANNS CHEMO 150 MG IV SOLR
6.0000 mg/kg | Freq: Once | INTRAVENOUS | Status: AC
  Administered 2021-05-09: 315 mg via INTRAVENOUS
  Filled 2021-05-09: qty 15

## 2021-05-09 MED ORDER — HYDROXYCHLOROQUINE SULFATE 200 MG PO TABS: 200.0000 mg | ORAL_TABLET | Freq: Every day | ORAL | 4 refills | Status: DC

## 2021-05-09 MED ORDER — SODIUM CHLORIDE 0.9 % IV SOLN
Freq: Once | INTRAVENOUS | Status: AC
  Filled 2021-05-09: qty 250

## 2021-05-09 MED ORDER — VENLAFAXINE HCL ER 37.5 MG PO CP24: 37.5000 mg | ORAL_CAPSULE | Freq: Every day | ORAL | 3 refills | Status: DC

## 2021-05-09 MED ORDER — WARFARIN SODIUM 5 MG PO TABS: 5.0000 mg | ORAL_TABLET | Freq: Every day | ORAL | 4 refills | Status: DC

## 2021-05-10 ENCOUNTER — Ambulatory Visit
Admission: RE | Admit: 2021-05-10 | Discharge: 2021-05-10 | Disposition: A | Discharge status: Home / Self Care | Payer: 59 | Source: Ambulatory Visit | Attending: Oncology | Admitting: Oncology

## 2021-05-10 ENCOUNTER — Telehealth: Payer: Self-pay | Admitting: Oncology

## 2021-05-10 DIAGNOSIS — C50511 Malignant neoplasm of lower-outer quadrant of right female breast: Secondary | ICD-10-CM

## 2021-05-10 DIAGNOSIS — C787 Secondary malignant neoplasm of liver and intrahepatic bile duct: Secondary | ICD-10-CM

## 2021-05-10 DIAGNOSIS — Z170 Estrogen receptor positive status [ER+]: Secondary | ICD-10-CM

## 2021-05-10 DIAGNOSIS — Z86711 Personal history of pulmonary embolism: Secondary | ICD-10-CM

## 2021-05-10 LAB — CANCER ANTIGEN 27.29: CA 27.29: 76.3 U/mL — ABNORMAL HIGH (ref 0.0–38.6)

## 2021-05-10 NOTE — Telephone Encounter (Signed)
Scheduled appointment per 06/29 los. Patient will receive updated calender.

## 2021-05-14 ENCOUNTER — Other Ambulatory Visit: Payer: Self-pay | Admitting: Oncology

## 2021-05-15 NOTE — Progress Notes (Signed)
Office Visit Note  Patient: Erica Wood             Date of Birth: 1966-09-16           MRN: [id number]             PCP: Eunice Blase, MD Referring: Eunice Blase, MD Visit Date: 05/29/2021 Occupation: @GUAROCC @  Subjective:  Follow-up (Patient has DEXA on 05/10/2021, results available in epic. )   History of Present Illness: Erica Wood is a 55 y.o. female with a history of systemic lupus.  She has been going through chemotherapy for breast cancer.  She was off Plaquenil for several months now.  She started having increased pain and discomfort in her joints and her muscles.  She was placed on Plaquenil 200 mg p.o. daily about 3 weeks ago by Dr. Jana Hakim.  She has noticed some improvement.  She continues to have some swelling in her hands.  She is having stiffness in multiple joints.  She denies any history of oral ulcers, nasal ulcers, sicca symptoms, Raynaud's, lymphadenopathy or rash.  She has not had any recent discoid rash.  Activities of Daily Living:  Patient reports morning stiffness for 15-20 minutes.   Patient Denies nocturnal pain.  Difficulty dressing/grooming: Denies Difficulty climbing stairs: Reports Difficulty getting out of chair: Reports Difficulty using hands for taps, buttons, cutlery, and/or writing: Reports  Review of Systems  Constitutional:  Positive for fatigue.  HENT:  Negative for mouth sores, mouth dryness and nose dryness.   Eyes:  Positive for itching. Negative for pain, visual disturbance and dryness.  Respiratory:  Negative for cough, hemoptysis, shortness of breath and difficulty breathing.   Cardiovascular:  Positive for swelling in legs/feet. Negative for chest pain and palpitations.  Gastrointestinal:  Positive for constipation. Negative for abdominal pain, blood in stool and diarrhea.  Endocrine: Negative for increased urination.  Genitourinary:  Negative for painful urination.  Musculoskeletal:  Positive for joint pain,  joint pain, joint swelling, myalgias, muscle weakness, morning stiffness, muscle tenderness and myalgias.  Skin:  Positive for color change. Negative for rash, redness and sensitivity to sunlight.  Allergic/Immunologic: Negative for susceptible to infections.  Neurological:  Positive for memory loss and weakness. Negative for dizziness, numbness and headaches.  Hematological:  Negative for swollen glands.  Psychiatric/Behavioral:  Negative for depressed mood, confusion and sleep disturbance. The patient is nervous/anxious.    PMFS History:  Patient Active Problem List   Diagnosis Date Noted   Goals of care, counseling/discussion 05/09/2021   Liver metastases ([place]) 01/03/2021   Port-A-Cath in place 11/02/2020   Aortic atherosclerosis ([place]) 09/21/2020   Malignant neoplasm of lower-outer quadrant of right breast of female, estrogen receptor positive ([place]) 08/10/2020   Abnormal cervical Papanicolaou smear 07/12/2020   Pulmonary embolism ([place]) 07/12/2020   Fibrocystic disease of breast 01/22/2018   Primary osteoarthritis of both knees 05/02/2017   Raynaud's disease without gangrene 11/26/2016   Discoid lupus 11/26/2016   Anticardiolipin antibody positive 11/26/2016   Autoimmune disease ([place]) 11/25/2016   High risk medication use 11/25/2016   Primary osteoarthritis of both hands 11/25/2016   History of DVT (deep vein thrombosis) 11/25/2016   History of pulmonary embolism 11/25/2016   Primary insomnia 11/25/2016   Anxiety 11/25/2016   Irritable bowel syndrome with constipation 11/25/2016   Raynaud's phenomenon 01/03/2016   Varicose veins of lower extremities with other complications 57/84/6962   Spider veins of both lower extremities 05/24/2014    Past Medical History:  Diagnosis  Date   Anti-cardiolipin antibody positive    Anti-cardiolipin antibody syndrome (HCC)    Anxiety    Arthralgia    Arthritis    bil knees, hands   Atypical chest pain    Autoimmune disease ([place])    Breast  cancer metastasized to liver ([place]) 09/2020   Cancer ([place]) 07/2020   right breast IDC   Chronic fatigue    Depression    DVT (deep venous thrombosis) (HCC)    IBS (irritable bowel syndrome)    with constipation   Lupus (HCC)    Pulmonary embolus (HCC)    Raynaud's syndrome    Sicca (HCC)    Thrombocytopenia (HCC)     Family History  Problem Relation Age of Onset   Rheum arthritis Father    Hemachromatosis Father    Heart Problems Father    Cancer Sister        bone    Colon cancer Neg Hx    Stomach cancer Neg Hx    Esophageal cancer Neg Hx    Pancreatic cancer Neg Hx    Liver disease Neg Hx    Past Surgical History:  Procedure Laterality Date   ABLATION  12/2018   leg veins   ABLATION Right 08/2019   venous leg    APPENDECTOMY     COLON SURGERY     Perforated colon repair after colostomy   EYE SURGERY Bilateral 2022   GANGLION CYST EXCISION     HYSTEROSCOPY WITH D & C N/A 01/13/2014   Procedure: DILATATION AND CURETTAGE /HYSTEROSCOPY with resection ;  Surgeon: Olga Millers, MD;  Location: [facility];  Service: Gynecology;  Laterality: N/A;   LAPAROTOMY     PORTACATH PLACEMENT N/A 09/13/2020   Procedure: INSERTION PORT-A-CATH WITH ULTRASOUND GUIDANCE;  Surgeon: Rolm Bookbinder, MD;  Location: [facility];  Service: General;  Laterality: N/A;   Social History   Social History Narrative   Not on file   Immunization History  Administered Date(s) Administered   PFIZER Comirnaty(Gray Top)Covid-19 Tri-Sucrose Vaccine 08/11/2020     Objective: Vital Signs: BP 114/77 (BP Location: Left Arm, Patient Position: Sitting, Cuff Size: Normal)   Pulse 69   Ht 5\' 4"  (1.626 m)   Wt 115 lb 14.4 oz (52.6 kg)   BMI 19.89 kg/m    Physical Exam Vitals and nursing note reviewed.  Constitutional:      Appearance: She is well-developed.  HENT:     Head: Normocephalic and atraumatic.  Eyes:     Conjunctiva/sclera: Conjunctivae normal.  Cardiovascular:      Rate and Rhythm: Normal rate and regular rhythm.     Heart sounds: Normal heart sounds.  Pulmonary:     Effort: Pulmonary effort is normal.     Breath sounds: Normal breath sounds.  Abdominal:     General: Bowel sounds are normal.     Palpations: Abdomen is soft.  Musculoskeletal:     Cervical back: Normal range of motion.  Lymphadenopathy:     Cervical: No cervical adenopathy.  Skin:    General: Skin is warm and dry.     Capillary Refill: Capillary refill takes less than 2 seconds.  Neurological:     Mental Status: She is alert and oriented to person, place, and time.  Psychiatric:        Behavior: Behavior normal.     Musculoskeletal Exam: C-spine good range of motion.  Shoulders and elbows were in good range of motion.  She had  tenderness over bilateral MCPs with synovitis.  Hip joints and knee joints with good range of motion without any warmth swelling or effusion.  There was no tenderness over ankles or MTPs.  CDAI Exam: CDAI Score: -- Patient Global: --; Provider Global: -- Swollen: --; Tender: -- Joint Exam 05/29/2021   No joint exam has been documented for this visit   There is currently no information documented on the homunculus. Go to the Rheumatology activity and complete the homunculus joint exam.  Investigation: No additional findings.  Imaging: MR BREAST BILATERAL W WO CONTRAST INC CAD  Result Date: 05/01/2021 CLINICAL DATA:  Patient with known right breast carcinoma. Assess response to neoadjuvant chemotherapy. Patient also with liver lesions consistent with metastatic disease, assessed with liver MRI on 08/24/2020. On previous breast MRI, patient was found have two additional suspicious masses in the medial right breast, which have not been biopsied. A suspicious mass in the upper outer right breast was biopsied under MRI guidance on 09/05/2020, pathology revealing a sclerotic lesion with focal lobular neoplasia. Patient's most recent prior breast MRI is dated  12/26/2020. LABS:  No labs drawn at time of imaging. EXAM: BILATERAL BREAST MRI WITH AND WITHOUT CONTRAST TECHNIQUE: Multiplanar, multisequence MR images of both breasts were obtained prior to and following the intravenous administration of 5 ml of Gadavist Three-dimensional MR images were rendered by post-processing of the original MR data on an independent workstation. The three-dimensional MR images were interpreted, and findings are reported in the following complete MRI report for this study. Three dimensional images were evaluated at the independent interpreting workstation using the DynaCAD thin client. COMPARISON:  Prior exams including previous breast MRIs, most recent breast MRI dated 12/26/2020. FINDINGS: Breast composition: d. Extreme fibroglandular tissue. Background parenchymal enhancement: Mild Right breast: Small residual enhancing mass in the inferior right breast, 6 x 4 mm transversely, image 104, series 9, associated with a post biopsy marker clip artifact, reflecting the index malignancy. This measured 8 mm in greatest dimension on the prior exam, 2.6 x 2.2 x 2.5 cm on the pre therapy study. Small enhancing mass in the central, slightly upper inner left breast, image 67, series 9, 4 mm in long axis, previously 7 mm in long axis. Small enhancing mass in the more superior, upper inner right breast, image 61, series 9, 7 x 4 mm, previously 13 x 7 mm. Small enhancing mass in outer right breast, image 62, series 9, with an associated post biopsy marker clip artifact, currently measures 4 mm greatest dimension, without significant change from the most recent prior study. No new breast masses or areas of abnormal enhancement. Only the known breast carcinoma show significant enhancement on the initial post-contrast sequence. Left breast: No mass or abnormal enhancement. Lymph nodes: No abnormal appearing lymph nodes. Ancillary findings:  None. IMPRESSION: 1. Additional positive response to neoadjuvant  chemotherapy. The index malignancy in the inferior right breast currently measures 6 x 4 mm transversely slightly decreased in size from the most recent prior study, markedly decreased from the original, pre therapy exam. The 2 breast lesions noted medial right breast, which were not biopsied, have shown an interval decrease in size and are all only conspicuous the more delayed post gadolinium sequences. A small previously biopsied mass in upper outer right breast is without significant change from the most recent prior exam. 2. No new masses or areas of abnormal enhancement. RECOMMENDATION: 1. Treatment as planned for right breast malignancy and previously biopsied area right breast lobular neoplasia. The  other 2 small enhancing masses in the upper medial right breast can be considered for MRI guided biopsy or alternatively close attention on follow-up MRI surveillance. BI-RADS CATEGORY  6: Known biopsy-proven malignancy. Electronically Signed   By: Lajean Manes M.D.   On: 05/01/2021 12:04  DG MOBILE BONE DENSITY  Result Date: 05/12/2021 CLINICAL DATA:  Postmenopausal. No previous bone building therapy. EXAM: DUAL X-RAY ABSORPTIOMETRY (DXA) FOR BONE MINERAL DENSITY TECHNIQUE: Bone mineral density measurements are performed of the spine, hip, and forearm, as appropriate, per International Society of Clinical Densitometry recommendations. The pertinent regions of interest are reported below. Non-contributory values are not reported. Images are obtained for bone mineral density measurement and are not obtained for diagnostic purposes. FINDINGS: AP LUMBAR SPINE L1-L4 Bone Mineral Density (BMD):  0.761 g/cm2 Young Adult T-Score:  -2.6 Z-Score:  -1.7 LEFT FEMUR neck Bone Mineral Density (BMD):  0.641 g/cm2 Young Adult T-Score: -1.9 Z-Score:  -0.9 Unit: This study was performed at [facility] on the [place] (S/N [phone]), software version 13.4.2. Scan quality: The scan quality is good. Exclusions:  None ASSESSMENT: Patient's diagnostic category is OSTEOPOROSIS by WHO Criteria. FRACTURE RISK: INCREASED FRAX: World Health Organization FRAX assessment of absolute fracture risk is not calculated for this patient because the patient has T-score at or below -2.5. COMPARISON: None. RECOMMENDATIONS 1. All patients should optimize calcium and vitamin D intake. 2. Consider FDA-approved medical therapies in postmenopausal women and men aged 14 years and older, based on the following: - A hip or vertebral (clinical or morphometric) fracture - T-score less than or equal to -2.5 at the femoral neck or spine after appropriate evaluation to exclude secondary causes - Low bone mass (T-score between -1.0 and -2.5 at the femoral neck or spine) and a 10-year probability of a hip fracture greater than or equal to 3% or a 10-year probability of a major osteoporosis-related fracture greater than or equal to 20% based on the [place]-adapted WHO algorithm - Clinician judgment and/or patient preferences may indicate treatment for people with 10-year fracture probabilities above or below these levels 3. Patients with diagnosis of osteoporosis or at high risk for fracture should have regular bone mineral density tests. For patients eligible for Medicare, routine testing is allowed once every 2 years. The testing frequency can be increased to one year for patients who have rapidly progressing disease, those who are receiving or discontinuing medical therapy to restore bone mass, or have additional risk factors. Electronically Signed   By: Nolon Nations M.D.   On: 05/12/2021 11:01    Recent Labs: Lab Results  Component Value Date   WBC 3.2 (L) 05/09/2021   HGB 13.2 05/09/2021   PLT 261 05/09/2021   NA 139 05/09/2021   K 4.6 05/09/2021   CL 103 05/09/2021   CO2 28 05/09/2021   GLUCOSE 106 (H) 05/09/2021   BUN 15 05/09/2021   CREATININE 1.00 05/09/2021   BILITOT 0.5 05/09/2021   ALKPHOS 84 05/09/2021   AST 18 05/09/2021   ALT  14 05/09/2021   PROT 6.9 05/09/2021   ALBUMIN 3.9 05/09/2021   CALCIUM 9.9 05/09/2021   GFRAA 95 07/28/2020    Speciality Comments: PLQ Eye Exam: 01/19/2021 WNL @ [facility] follow up in 1 year. stage IB invasive ductal carcinoma, grade 3 08/16/20  Procedures:  No procedures performed Allergies: Beef-derived products, Latex, and Sulfa antibiotics   Assessment / Plan:     Visit Diagnoses: Other organ or system involvement in systemic lupus erythematosus (  [place]) - Positive ANA and positive double-stranded DNA, hypocomplementemia, inflammatory arthritis, raynauds phenomenon: She has been having increased pain and inflammation in her joints.  She has synovitis of her bilateral MCPs.  She was recently started on Plaquenil 3 weeks ago by Dr. Jana Hakim.  She is on Plaquenil 200 mg p.o. daily.  I offered prednisone taper which she declined.  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  High risk medication use - She has been holding Plaquenil while undergoing chemotherapy. Reatrted PLQ 200 mg po qd 3 weeks ago.  Patient states she will be getting regular eye examinations.  Malignant neoplasm of lower-outer quadrant of right breast of female, estrogen receptor positive ([place]) - she was diagnosed with triple positive stage IV breast cancer and has been following along closely with Dr. Jana Hakim.  She is currently on immunotherapy and anastrozole.  She will be getting labs to Dr. Jana Hakim next week.  I have pended some of the autoimmune labs.   Raynaud's disease without gangrene-currently not active.  Anticardiolipin antibody positive - She had low titer antibody titers.  She is on long-term warfarin due to history of DVT and PE.  Primary osteoarthritis of both hands-she has underlying osteoarthritis in her hands.  Primary osteoarthritis of both knees - She underwent Euflexxa Visco gel injections in both knees performed by Dr. Junius Roads  Primary insomnia-she has been taking Adderall for  fatigue.  Have advised her to taper off Adderall.  Vitamin D deficiency-we will check vitamin D level.  History of DVT (deep vein thrombosis) - She is on warfarin.    History of pulmonary embolism  History of anxiety  History of IBS  Family history of hemochromatosis  Orders: Orders Placed This Encounter  Procedures   Urinalysis, Routine w reflex microscopic   COMPLETE METABOLIC PANEL WITH GFR   CBC with Differential/Platelet   Anti-DNA antibody, double-stranded   C3 and C4   Sedimentation rate   VITAMIN D 25 Hydroxy (Vit-D Deficiency, Fractures)   Cardiolipin antibodies, IgG, IgM, IgA    No orders of the defined types were placed in this encounter.    Follow-Up Instructions: Return in about 2 months (around 07/30/2021) for SLE.   Bo Merino, MD  Note - This record has been created using Editor, commissioning.  Chart creation errors have been sought, but may not always  have been located. Such creation errors do not reflect on  the standard of medical care.

## 2021-05-29 ENCOUNTER — Encounter: Payer: Self-pay | Admitting: Family Medicine

## 2021-05-29 ENCOUNTER — Other Ambulatory Visit: Payer: Self-pay

## 2021-05-29 ENCOUNTER — Ambulatory Visit: Payer: Self-pay

## 2021-05-29 ENCOUNTER — Encounter: Payer: Self-pay | Admitting: Rheumatology

## 2021-05-29 ENCOUNTER — Ambulatory Visit (INDEPENDENT_AMBULATORY_CARE_PROVIDER_SITE_OTHER): Payer: 59 | Admitting: Family Medicine

## 2021-05-29 ENCOUNTER — Ambulatory Visit (INDEPENDENT_AMBULATORY_CARE_PROVIDER_SITE_OTHER): Payer: 59 | Admitting: Rheumatology

## 2021-05-29 VITALS — BP 114/77 | HR 69 | Ht 64.0 in | Wt 115.9 lb

## 2021-05-29 DIAGNOSIS — Z86718 Personal history of other venous thrombosis and embolism: Secondary | ICD-10-CM

## 2021-05-29 DIAGNOSIS — Z79899 Other long term (current) drug therapy: Secondary | ICD-10-CM | POA: Diagnosis not present

## 2021-05-29 DIAGNOSIS — Z8349 Family history of other endocrine, nutritional and metabolic diseases: Secondary | ICD-10-CM

## 2021-05-29 DIAGNOSIS — M19041 Primary osteoarthritis, right hand: Secondary | ICD-10-CM

## 2021-05-29 DIAGNOSIS — Z170 Estrogen receptor positive status [ER+]: Secondary | ICD-10-CM

## 2021-05-29 DIAGNOSIS — M3219 Other organ or system involvement in systemic lupus erythematosus: Secondary | ICD-10-CM | POA: Diagnosis not present

## 2021-05-29 DIAGNOSIS — R1084 Generalized abdominal pain: Secondary | ICD-10-CM | POA: Diagnosis not present

## 2021-05-29 DIAGNOSIS — M170 Bilateral primary osteoarthritis of knee: Secondary | ICD-10-CM

## 2021-05-29 DIAGNOSIS — M79671 Pain in right foot: Secondary | ICD-10-CM

## 2021-05-29 DIAGNOSIS — L930 Discoid lupus erythematosus: Secondary | ICD-10-CM | POA: Diagnosis not present

## 2021-05-29 DIAGNOSIS — M19042 Primary osteoarthritis, left hand: Secondary | ICD-10-CM

## 2021-05-29 DIAGNOSIS — C50511 Malignant neoplasm of lower-outer quadrant of right female breast: Secondary | ICD-10-CM | POA: Diagnosis not present

## 2021-05-29 DIAGNOSIS — E559 Vitamin D deficiency, unspecified: Secondary | ICD-10-CM

## 2021-05-29 DIAGNOSIS — R760 Raised antibody titer: Secondary | ICD-10-CM

## 2021-05-29 DIAGNOSIS — I7300 Raynaud's syndrome without gangrene: Secondary | ICD-10-CM

## 2021-05-29 DIAGNOSIS — K5900 Constipation, unspecified: Secondary | ICD-10-CM

## 2021-05-29 DIAGNOSIS — F5101 Primary insomnia: Secondary | ICD-10-CM

## 2021-05-29 DIAGNOSIS — Z8659 Personal history of other mental and behavioral disorders: Secondary | ICD-10-CM

## 2021-05-29 DIAGNOSIS — Z8719 Personal history of other diseases of the digestive system: Secondary | ICD-10-CM

## 2021-05-29 DIAGNOSIS — Z86711 Personal history of pulmonary embolism: Secondary | ICD-10-CM

## 2021-05-29 NOTE — Progress Notes (Signed)
Office Visit Note   Patient: Erica Wood           Date of Birth: Feb 25, 1966           MRN: [id number] Visit Date: 05/29/2021 Requested by: Eunice Blase, MD [address] [place],  [place] [address] PCP: Eunice Blase, MD  Subjective: Chief Complaint  Patient presents with   Right Foot - Pain    Having pain in the top of the foot again, same area as when she was seen 01/30/21. The pain (and bruising) had resolved. Then, it started again 2-3 days ago - no new incident/injury. Is having multiple joint pains - recently restarted on Plaquenil.    HPI: She is here with right foot pain.  Recurrent pain on top of her foot primarily with weightbearing.  This happened in March but the pain resolved after couple weeks.  No change in her activities to account for this.  She recently restarted Plaquenil.  She continues to have abdominal issues with intermittent constipation.  She was given a sample of a new medication to try which is a serotonin agonist.  She is concerned about the possibility of depression with this.  She has not yet done the [place] test.                ROS:   All other systems were reviewed and are negative.  Objective: Vital Signs: There were no vitals taken for this visit.  Physical Exam:  General:  Alert and oriented, in no acute distress. Pulm:  Breathing unlabored. Psy:  Normal mood, congruent affect.  Right foot: She has mild tenderness to palpation of her third metatarsal shaft, a little bit at the second and fourth as well.  No pain with strength testing of the toes.  She has quite a bit of pain with passive manipulation of the midfoot.  Imaging: XR Foot Complete Right  Result Date: 05/29/2021 X-rays right foot reveal no obvious stress fracture, no change compared to March.  She does have some DJD at the first MTP joint.  No sign of neoplasm.   Assessment & Plan: Recurrent right foot pain, etiology uncertain. -Try Voltaren gel, glucosamine.  Could  consider MRI if symptoms persist.  2.  Chronic abdominal pain with constipation -She will proceed with [facility] testing in her current condition to get a better idea of her gastrointestinal health.     Procedures: No procedures performed        PMFS History: Patient Active Problem List   Diagnosis Date Noted   Goals of care, counseling/discussion 05/09/2021   Liver metastases ([place]) 01/03/2021   Port-A-Cath in place 11/02/2020   Aortic atherosclerosis ([place]) 09/21/2020   Malignant neoplasm of lower-outer quadrant of right breast of female, estrogen receptor positive ([name]) 08/10/2020   Abnormal cervical Papanicolaou smear 07/12/2020   Pulmonary embolism ([place]) 07/12/2020   Fibrocystic disease of breast 01/22/2018   Primary osteoarthritis of both knees 05/02/2017   Raynaud's disease without gangrene 11/26/2016   Discoid lupus 11/26/2016   Anticardiolipin antibody positive 11/26/2016   Autoimmune disease ([place]) 11/25/2016   High risk medication use 11/25/2016   Primary osteoarthritis of both hands 11/25/2016   History of DVT (deep vein thrombosis) 11/25/2016   History of pulmonary embolism 11/25/2016   Primary insomnia 11/25/2016   Anxiety 11/25/2016   Irritable bowel syndrome with constipation 11/25/2016   Raynaud's phenomenon 01/03/2016   Varicose veins of lower extremities with other complications 81/82/9937   Spider veins of both lower  extremities 05/24/2014   Past Medical History:  Diagnosis Date   Anti-cardiolipin antibody positive    Anti-cardiolipin antibody syndrome (HCC)    Anxiety    Arthralgia    Arthritis    bil knees, hands   Atypical chest pain    Autoimmune disease ([place])    Breast cancer metastasized to liver ([place]) 09/2020   Cancer ([facility]) 07/2020   right breast IDC   Chronic fatigue    Depression    DVT (deep venous thrombosis) (HCC)    IBS (irritable bowel syndrome)    with constipation   Lupus (HCC)    Pulmonary embolus (HCC)    Raynaud's syndrome     Sicca (HCC)    Thrombocytopenia ([place])     Family History  Problem Relation Age of Onset   Rheum arthritis Father    Hemachromatosis Father    Heart Problems Father    Cancer Sister        bone    Colon cancer Neg Hx    Stomach cancer Neg Hx    Esophageal cancer Neg Hx    Pancreatic cancer Neg Hx    Liver disease Neg Hx     Past Surgical History:  Procedure Laterality Date   ABLATION  12/2018   leg veins   ABLATION Right 08/2019   venous leg    APPENDECTOMY     COLON SURGERY     Perforated colon repair after colostomy   EYE SURGERY Bilateral 2022   GANGLION CYST EXCISION     HYSTEROSCOPY WITH D & C N/A 01/13/2014   Procedure: DILATATION AND CURETTAGE /HYSTEROSCOPY with resection ;  Surgeon: Olga Millers, MD;  Location: [facility];  Service: Gynecology;  Laterality: N/A;   LAPAROTOMY     PORTACATH PLACEMENT N/A 09/13/2020   Procedure: INSERTION PORT-A-CATH WITH ULTRASOUND GUIDANCE;  Surgeon: Rolm Bookbinder, MD;  Location: [facility];  Service: General;  Laterality: N/A;   Social History   Occupational History   Not on file  Tobacco Use   Smoking status: Former    Packs/day: 0.50    Years: 5.00    Pack years: 2.50    Types: Cigarettes    Start date: 08/06/2006    Quit date: 08/07/2011    Years since quitting: 9.8   Smokeless tobacco: Never  Vaping Use   Vaping Use: Never used  Substance and Sexual Activity   Alcohol use: Yes    Comment: occasionally   Drug use: No   Sexual activity: Yes    Birth control/protection: Pill

## 2021-05-29 NOTE — Patient Instructions (Signed)
These get labs through Dr. Virgie Dad office.

## 2021-05-30 ENCOUNTER — Inpatient Hospital Stay: Payer: 59 | Attending: Adult Health

## 2021-05-30 ENCOUNTER — Inpatient Hospital Stay: Payer: 59

## 2021-05-30 ENCOUNTER — Other Ambulatory Visit: Payer: Self-pay | Admitting: Hematology and Oncology

## 2021-05-30 ENCOUNTER — Other Ambulatory Visit: Payer: Self-pay

## 2021-05-30 ENCOUNTER — Other Ambulatory Visit: Payer: Self-pay | Admitting: Adult Health

## 2021-05-30 VITALS — BP 113/50 | HR 66 | Temp 98.8°F | Resp 17

## 2021-05-30 DIAGNOSIS — Z86718 Personal history of other venous thrombosis and embolism: Secondary | ICD-10-CM | POA: Insufficient documentation

## 2021-05-30 DIAGNOSIS — Z170 Estrogen receptor positive status [ER+]: Secondary | ICD-10-CM | POA: Insufficient documentation

## 2021-05-30 DIAGNOSIS — Z5112 Encounter for antineoplastic immunotherapy: Secondary | ICD-10-CM | POA: Diagnosis present

## 2021-05-30 DIAGNOSIS — C787 Secondary malignant neoplasm of liver and intrahepatic bile duct: Secondary | ICD-10-CM

## 2021-05-30 DIAGNOSIS — Z7901 Long term (current) use of anticoagulants: Secondary | ICD-10-CM | POA: Insufficient documentation

## 2021-05-30 DIAGNOSIS — C50511 Malignant neoplasm of lower-outer quadrant of right female breast: Secondary | ICD-10-CM | POA: Insufficient documentation

## 2021-05-30 DIAGNOSIS — Z95828 Presence of other vascular implants and grafts: Secondary | ICD-10-CM

## 2021-05-30 LAB — COMPREHENSIVE METABOLIC PANEL
ALT: 12 U/L (ref 0–44)
AST: 19 U/L (ref 15–41)
Albumin: 3.9 g/dL (ref 3.5–5.0)
Alkaline Phosphatase: 86 U/L (ref 38–126)
Anion gap: 7 (ref 5–15)
BUN: 17 mg/dL (ref 6–20)
CO2: 28 mmol/L (ref 22–32)
Calcium: 9.7 mg/dL (ref 8.9–10.3)
Chloride: 104 mmol/L (ref 98–111)
Creatinine, Ser: 0.99 mg/dL (ref 0.44–1.00)
GFR, Estimated: >60 mL/min (ref >60)
Glucose, Bld: 92 mg/dL (ref 70–99)
Potassium: 4.8 mmol/L (ref 3.5–5.1)
Sodium: 139 mmol/L (ref 135–145)
Total Bilirubin: 0.3 mg/dL (ref 0.3–1.2)
Total Protein: 6.7 g/dL (ref 6.5–8.1)

## 2021-05-30 LAB — PROTIME-INR
INR: 1.3 — ABNORMAL HIGH (ref 0.8–1.2)
Prothrombin Time: 16.6 seconds — ABNORMAL HIGH (ref 11.4–15.2)

## 2021-05-30 LAB — CBC WITH DIFFERENTIAL/PLATELET
Abs Immature Granulocytes: 0.01 10*3/uL (ref 0.00–0.07)
Basophils Absolute: 0.1 10*3/uL (ref 0.0–0.1)
Basophils Relative: 4 %
Eosinophils Absolute: 0 10*3/uL (ref 0.0–0.5)
Eosinophils Relative: 1 %
HCT: 35.8 % — ABNORMAL LOW (ref 36.0–46.0)
Hemoglobin: 12.2 g/dL (ref 12.0–15.0)
Immature Granulocytes: 0 %
Lymphocytes Relative: 45 %
Lymphs Abs: 1.2 10*3/uL (ref 0.7–4.0)
MCH: 35.1 pg — ABNORMAL HIGH (ref 26.0–34.0)
MCHC: 34.1 g/dL (ref 30.0–36.0)
MCV: 102.9 fL — ABNORMAL HIGH (ref 80.0–100.0)
Monocytes Absolute: 0.3 10*3/uL (ref 0.1–1.0)
Monocytes Relative: 11 %
Neutro Abs: 1 10*3/uL — ABNORMAL LOW (ref 1.7–7.7)
Neutrophils Relative %: 39 %
Platelets: 235 10*3/uL (ref 150–400)
RBC: 3.48 MIL/uL — ABNORMAL LOW (ref 3.87–5.11)
RDW: 13.2 % (ref 11.5–15.5)
WBC: 2.7 10*3/uL — ABNORMAL LOW (ref 4.0–10.5)
nRBC: 0 % (ref 0.0–0.2)

## 2021-05-30 MED ORDER — SODIUM CHLORIDE 0.9% FLUSH
10.0000 mL | Freq: Once | INTRAVENOUS | Status: AC
  Administered 2021-05-30: 10 mL
  Filled 2021-05-30: qty 10

## 2021-05-30 MED ORDER — SODIUM CHLORIDE 0.9 % IV SOLN
Freq: Once | INTRAVENOUS | Status: AC
  Filled 2021-05-30: qty 250

## 2021-05-30 MED ORDER — TRASTUZUMAB-ANNS CHEMO 150 MG IV SOLR
6.0000 mg/kg | Freq: Once | INTRAVENOUS | Status: AC
  Administered 2021-05-30: 315 mg via INTRAVENOUS
  Filled 2021-05-30: qty 15

## 2021-05-30 MED ORDER — ACETAMINOPHEN 325 MG PO TABS
650.0000 mg | ORAL_TABLET | Freq: Once | ORAL | Status: AC
  Administered 2021-05-30: 650 mg via ORAL

## 2021-05-30 MED ORDER — DIPHENHYDRAMINE HCL 25 MG PO CAPS
25.0000 mg | ORAL_CAPSULE | Freq: Once | ORAL | Status: AC
  Administered 2021-05-30: 25 mg via ORAL

## 2021-05-30 NOTE — Progress Notes (Signed)
Ok per MD to add orders for Benadryl 25mg .   Orders placed.

## 2021-05-30 NOTE — Progress Notes (Signed)
This [place] stool FEN

## 2021-05-30 NOTE — Progress Notes (Signed)
Canceled INR as we are not following it and managing.  Wilber Bihari, NP

## 2021-05-31 ENCOUNTER — Telehealth: Payer: Self-pay

## 2021-05-31 DIAGNOSIS — R760 Raised antibody titer: Secondary | ICD-10-CM

## 2021-05-31 DIAGNOSIS — Z79899 Other long term (current) drug therapy: Secondary | ICD-10-CM

## 2021-05-31 DIAGNOSIS — E559 Vitamin D deficiency, unspecified: Secondary | ICD-10-CM

## 2021-05-31 DIAGNOSIS — M3219 Other organ or system involvement in systemic lupus erythematosus: Secondary | ICD-10-CM

## 2021-05-31 LAB — CANCER ANTIGEN 27.29: CA 27.29: 69.2 U/mL — ABNORMAL HIGH (ref 0.0–38.6)

## 2021-05-31 NOTE — Addendum Note (Signed)
Addended by: Carole Binning on: 05/31/2021 03:26 PM   Modules accepted: Orders

## 2021-05-31 NOTE — Telephone Encounter (Signed)
Lab Orders released.  

## 2021-05-31 NOTE — Addendum Note (Signed)
Addended by: Carole Binning on: 05/31/2021 03:24 PM   Modules accepted: Orders

## 2021-05-31 NOTE — Telephone Encounter (Signed)
Patient called stating the hospital wouldn't do the labwork that Dr. Estanislado Pandy ordered.  Patient requested the orders be sent to [facility] on [facility] and she will go tomorrow 06/01/21.

## 2021-06-02 ENCOUNTER — Encounter: Payer: Self-pay | Admitting: Oncology

## 2021-06-04 ENCOUNTER — Encounter: Payer: Self-pay | Admitting: Family Medicine

## 2021-06-05 ENCOUNTER — Encounter: Payer: Self-pay | Admitting: Oncology

## 2021-06-06 ENCOUNTER — Other Ambulatory Visit: Payer: Self-pay

## 2021-06-06 ENCOUNTER — Encounter: Payer: Self-pay | Admitting: Oncology

## 2021-06-06 DIAGNOSIS — M3219 Other organ or system involvement in systemic lupus erythematosus: Secondary | ICD-10-CM

## 2021-06-06 LAB — CARDIOLIPIN ANTIBODIES, IGG, IGM, IGA
Anticardiolipin IgA: <9 APL U/mL (ref 0–11)
Anticardiolipin IgG: <9 GPL U/mL (ref 0–14)
Anticardiolipin IgM: 21 MPL U/mL — ABNORMAL HIGH (ref 0–12)

## 2021-06-06 LAB — CMP14+EGFR
ALT: 11 IU/L (ref 0–32)
AST: 16 IU/L (ref 0–40)
Albumin/Globulin Ratio: 2 (ref 1.2–2.2)
Albumin: 4.4 g/dL (ref 3.8–4.9)
Alkaline Phosphatase: 98 IU/L (ref 44–121)
BUN/Creatinine Ratio: 17 (ref 9–23)
BUN: 16 mg/dL (ref 6–24)
Bilirubin Total: 0.3 mg/dL (ref 0.0–1.2)
CO2: 25 mmol/L (ref 20–29)
Calcium: 9.3 mg/dL (ref 8.7–10.2)
Chloride: 99 mmol/L (ref 96–106)
Creatinine, Ser: 0.93 mg/dL (ref 0.57–1.00)
Globulin, Total: 2.2 g/dL (ref 1.5–4.5)
Glucose: 95 mg/dL (ref 65–99)
Potassium: 4.6 mmol/L (ref 3.5–5.2)
Sodium: 137 mmol/L (ref 134–144)
Total Protein: 6.6 g/dL (ref 6.0–8.5)
eGFR: 73 mL/min/{1.73_m2} (ref >59)

## 2021-06-06 LAB — CBC WITH DIFFERENTIAL/PLATELET
Basophils Absolute: 0.1 10*3/uL (ref 0.0–0.2)
Basos: 3 %
EOS (ABSOLUTE): 0 10*3/uL (ref 0.0–0.4)
Eos: 0 %
Hematocrit: 37 % (ref 34.0–46.6)
Hemoglobin: 12.5 g/dL (ref 11.1–15.9)
Immature Grans (Abs): 0 10*3/uL (ref 0.0–0.1)
Immature Granulocytes: 0 %
Lymphocytes Absolute: 1.3 10*3/uL (ref 0.7–3.1)
Lymphs: 42 %
MCH: 34.8 pg — ABNORMAL HIGH (ref 26.6–33.0)
MCHC: 33.8 g/dL (ref 31.5–35.7)
MCV: 103 fL — ABNORMAL HIGH (ref 79–97)
Monocytes Absolute: 0.2 10*3/uL (ref 0.1–0.9)
Monocytes: 7 %
Neutrophils Absolute: 1.4 10*3/uL (ref 1.4–7.0)
Neutrophils: 48 %
Platelets: 279 10*3/uL (ref 150–450)
RBC: 3.59 x10E6/uL — ABNORMAL LOW (ref 3.77–5.28)
RDW: 12.9 % (ref 11.7–15.4)
WBC: 3 10*3/uL — ABNORMAL LOW (ref 3.4–10.8)

## 2021-06-06 LAB — C3 AND C4
Complement C3, Serum: 106 mg/dL (ref 82–167)
Complement C4, Serum: 24 mg/dL (ref 12–38)

## 2021-06-06 LAB — SEDIMENTATION RATE: Sed Rate: 9 mm/hr (ref 0–40)

## 2021-06-06 LAB — ANTI-DNA ANTIBODY, DOUBLE-STRANDED: dsDNA Ab: 1 IU/mL (ref 0–9)

## 2021-06-06 LAB — VITAMIN D 25 HYDROXY (VIT D DEFICIENCY, FRACTURES): Vit D, 25-Hydroxy: 79.9 ng/mL (ref 30.0–100.0)

## 2021-06-06 NOTE — Progress Notes (Signed)
White count is 3.0 which is low and stable.  CMP is normal, anticardiolipin IgM is low positive, vitamin D is high normal at 79.7, sed rate is normal, complements are normal, double-stranded DNA is negative.  No change in treatment advised.

## 2021-06-06 NOTE — Progress Notes (Signed)
UA order placed/released per Dr. Estanislado Pandy. A urine analysis was not completed on 06/01/2021. I advised patient and she will go within the next day or two to have the UA completed.

## 2021-06-08 ENCOUNTER — Other Ambulatory Visit: Payer: Self-pay

## 2021-06-08 MED ORDER — AMPHETAMINE-DEXTROAMPHET ER 20 MG PO CP24: 20.0000 mg | ORAL_CAPSULE | Freq: Every day | ORAL | 0 refills | Status: DC

## 2021-06-08 NOTE — Telephone Encounter (Signed)
Advised patient that refill has been sent. Patient verbalized understanding.

## 2021-06-08 NOTE — Telephone Encounter (Signed)
Patient called requesting prescription refill of Adderall to be sent to [facility] at [place].  Patient states she only has 2 pills remaining.

## 2021-06-08 NOTE — Telephone Encounter (Signed)
Last Visit: 05/29/2021 Next Visit: 07/31/2021   Last Fill: 05/08/2021  Okay to refill adderall?

## 2021-06-20 ENCOUNTER — Inpatient Hospital Stay: Payer: 59 | Attending: Adult Health

## 2021-06-20 ENCOUNTER — Other Ambulatory Visit: Payer: Self-pay

## 2021-06-20 ENCOUNTER — Other Ambulatory Visit: Payer: Self-pay | Admitting: *Deleted

## 2021-06-20 ENCOUNTER — Inpatient Hospital Stay: Payer: 59

## 2021-06-20 VITALS — BP 120/66 | HR 67 | Temp 98.0°F | Resp 18 | Wt 114.5 lb

## 2021-06-20 DIAGNOSIS — Z170 Estrogen receptor positive status [ER+]: Secondary | ICD-10-CM | POA: Diagnosis not present

## 2021-06-20 DIAGNOSIS — Z86718 Personal history of other venous thrombosis and embolism: Secondary | ICD-10-CM | POA: Insufficient documentation

## 2021-06-20 DIAGNOSIS — Z7901 Long term (current) use of anticoagulants: Secondary | ICD-10-CM | POA: Insufficient documentation

## 2021-06-20 DIAGNOSIS — Z5112 Encounter for antineoplastic immunotherapy: Secondary | ICD-10-CM | POA: Diagnosis not present

## 2021-06-20 DIAGNOSIS — C787 Secondary malignant neoplasm of liver and intrahepatic bile duct: Secondary | ICD-10-CM

## 2021-06-20 DIAGNOSIS — C50511 Malignant neoplasm of lower-outer quadrant of right female breast: Secondary | ICD-10-CM

## 2021-06-20 LAB — CBC WITH DIFFERENTIAL/PLATELET
Abs Immature Granulocytes: 0 10*3/uL (ref 0.00–0.07)
Basophils Absolute: 0.1 10*3/uL (ref 0.0–0.1)
Basophils Relative: 4 %
Eosinophils Absolute: 0 10*3/uL (ref 0.0–0.5)
Eosinophils Relative: 1 %
HCT: 37.9 % (ref 36.0–46.0)
Hemoglobin: 12.9 g/dL (ref 12.0–15.0)
Immature Granulocytes: 0 %
Lymphocytes Relative: 41 %
Lymphs Abs: 1.2 10*3/uL (ref 0.7–4.0)
MCH: 35.2 pg — ABNORMAL HIGH (ref 26.0–34.0)
MCHC: 34 g/dL (ref 30.0–36.0)
MCV: 103.6 fL — ABNORMAL HIGH (ref 80.0–100.0)
Monocytes Absolute: 0.2 10*3/uL (ref 0.1–1.0)
Monocytes Relative: 8 %
Neutro Abs: 1.4 10*3/uL — ABNORMAL LOW (ref 1.7–7.7)
Neutrophils Relative %: 46 %
Platelets: 146 10*3/uL — ABNORMAL LOW (ref 150–400)
RBC: 3.66 MIL/uL — ABNORMAL LOW (ref 3.87–5.11)
RDW: 12.1 % (ref 11.5–15.5)
WBC: 2.9 10*3/uL — ABNORMAL LOW (ref 4.0–10.5)
nRBC: 0 % (ref 0.0–0.2)

## 2021-06-20 LAB — COMPREHENSIVE METABOLIC PANEL
ALT: 15 U/L (ref 0–44)
AST: 19 U/L (ref 15–41)
Albumin: 3.9 g/dL (ref 3.5–5.0)
Alkaline Phosphatase: 79 U/L (ref 38–126)
Anion gap: 9 (ref 5–15)
BUN: 18 mg/dL (ref 6–20)
CO2: 26 mmol/L (ref 22–32)
Calcium: 9.1 mg/dL (ref 8.9–10.3)
Chloride: 103 mmol/L (ref 98–111)
Creatinine, Ser: 0.96 mg/dL (ref 0.44–1.00)
GFR, Estimated: >60 mL/min (ref >60)
Glucose, Bld: 86 mg/dL (ref 70–99)
Potassium: 4.1 mmol/L (ref 3.5–5.1)
Sodium: 138 mmol/L (ref 135–145)
Total Bilirubin: 0.3 mg/dL (ref 0.3–1.2)
Total Protein: 6.5 g/dL (ref 6.5–8.1)

## 2021-06-20 LAB — PROTIME-INR
INR: 1.8 — ABNORMAL HIGH (ref 0.8–1.2)
Prothrombin Time: 20.7 seconds — ABNORMAL HIGH (ref 11.4–15.2)

## 2021-06-20 MED ORDER — HEPARIN SOD (PORK) LOCK FLUSH 100 UNIT/ML IV SOLN
500.0000 [IU] | Freq: Once | INTRAVENOUS | Status: AC | PRN
  Administered 2021-06-20: 500 [IU]
  Filled 2021-06-20: qty 5

## 2021-06-20 MED ORDER — SODIUM CHLORIDE 0.9% FLUSH
10.0000 mL | INTRAVENOUS | Status: DC | PRN
  Administered 2021-06-20: 10 mL
  Filled 2021-06-20: qty 10

## 2021-06-20 MED ORDER — ACETAMINOPHEN 325 MG PO TABS
650.0000 mg | ORAL_TABLET | Freq: Once | ORAL | Status: AC
  Administered 2021-06-20: 650 mg via ORAL

## 2021-06-20 MED ORDER — DIPHENHYDRAMINE HCL 25 MG PO CAPS
25.0000 mg | ORAL_CAPSULE | Freq: Once | ORAL | Status: AC
  Administered 2021-06-20: 25 mg via ORAL

## 2021-06-20 MED ORDER — DIPHENHYDRAMINE HCL 25 MG PO CAPS
ORAL_CAPSULE | ORAL | Status: AC
  Filled 2021-06-20: qty 1

## 2021-06-20 MED ORDER — ACETAMINOPHEN 325 MG PO TABS
ORAL_TABLET | ORAL | Status: AC
  Filled 2021-06-20: qty 2

## 2021-06-20 MED ORDER — SODIUM CHLORIDE 0.9 % IV SOLN
Freq: Once | INTRAVENOUS | Status: AC
  Filled 2021-06-20: qty 250

## 2021-06-20 MED ORDER — TRASTUZUMAB-ANNS CHEMO 150 MG IV SOLR
6.0000 mg/kg | Freq: Once | INTRAVENOUS | Status: AC
  Administered 2021-06-20: 315 mg via INTRAVENOUS
  Filled 2021-06-20: qty 15

## 2021-06-20 NOTE — Patient Instructions (Signed)
[facility]  Discharge Instructions: °Thank you for choosing [facility] to provide your oncology and hematology care.  ° °If you have a lab appointment with the Cancer Center, please go directly to the Cancer Center and check in at the registration area. °  °Wear comfortable clothing and clothing appropriate for easy access to any Portacath or PICC line.  ° °We strive to give you quality time with your provider. You may need to reschedule your appointment if you arrive late (15 or more minutes).  Arriving late affects you and other patients whose appointments are after yours.  Also, if you miss three or more appointments without notifying the office, you may be dismissed from the clinic at the provider’s discretion.    °  °For prescription refill requests, have your pharmacy contact our office and allow 72 hours for refills to be completed.   ° °Today you received the following chemotherapy and/or immunotherapy agents: Kanjinti °  °To help prevent nausea and vomiting after your treatment, we encourage you to take your nausea medication as directed. ° °BELOW ARE SYMPTOMS THAT SHOULD BE REPORTED IMMEDIATELY: °*FEVER GREATER THAN 100.4 F (38 °C) OR HIGHER °*CHILLS OR SWEATING °*NAUSEA AND VOMITING THAT IS NOT CONTROLLED WITH YOUR NAUSEA MEDICATION °*UNUSUAL SHORTNESS OF BREATH °*UNUSUAL BRUISING OR BLEEDING °*URINARY PROBLEMS (pain or burning when urinating, or frequent urination) °*BOWEL PROBLEMS (unusual diarrhea, constipation, pain near the anus) °TENDERNESS IN MOUTH AND THROAT WITH OR WITHOUT PRESENCE OF ULCERS (sore throat, sores in mouth, or a toothache) °UNUSUAL RASH, SWELLING OR PAIN  °UNUSUAL VAGINAL DISCHARGE OR ITCHING  ° °Items with * indicate a potential emergency and should be followed up as soon as possible or go to the Emergency Department if any problems should occur. ° °Please show the CHEMOTHERAPY ALERT CARD or IMMUNOTHERAPY ALERT CARD at check-in to the  Emergency Department and triage nurse. ° °Should you have questions after your visit or need to cancel or reschedule your appointment, please contact [facility]  Dept: [phone]  and follow the prompts.  Office hours are 8:00 a.m. to 4:30 p.m. Monday - Friday. Please note that voicemails left after 4:00 p.m. may not be returned until the following business day.  We are closed weekends and major holidays. You have access to a nurse at all times for urgent questions. Please call the main number to the clinic Dept: [phone] and follow the prompts. ° ° °For any non-urgent questions, you may also contact your provider using MyChart. We now offer e-Visits for anyone 18 and older to request care online for non-urgent symptoms. For details visit [email or web address]. °  °Also download the MyChart app! Go to the app store, search "MyChart", open the app, select [facility], and log in with your MyChart username and password. ° °Due to Covid, a mask is required upon entering the hospital/clinic. If you do not have a mask, one will be given to you upon arrival. For doctor visits, patients may have 1 support person aged 18 or older with them. For treatment visits, patients cannot have anyone with them due to current Covid guidelines and our immunocompromised population.  ° °

## 2021-06-21 LAB — CANCER ANTIGEN 27.29: CA 27.29: 68.9 U/mL — ABNORMAL HIGH (ref 0.0–38.6)

## 2021-06-23 ENCOUNTER — Other Ambulatory Visit: Payer: Self-pay | Admitting: Oncology

## 2021-06-23 ENCOUNTER — Other Ambulatory Visit: Payer: Self-pay | Admitting: Family Medicine

## 2021-06-25 ENCOUNTER — Encounter: Payer: Self-pay | Admitting: Oncology

## 2021-06-27 LAB — URINALYSIS, ROUTINE W REFLEX MICROSCOPIC
Bilirubin, UA: NEGATIVE
Glucose, UA: NEGATIVE
Ketones, UA: NEGATIVE
Leukocytes,UA: NEGATIVE
Nitrite, UA: NEGATIVE
Protein,UA: NEGATIVE
RBC, UA: NEGATIVE
Specific Gravity, UA: 1.017 (ref 1.005–1.030)
Urobilinogen, Ur: 0.2 mg/dL (ref 0.2–1.0)
pH, UA: 6 (ref 5.0–7.5)

## 2021-06-27 NOTE — Progress Notes (Signed)
UA is negative

## 2021-07-11 ENCOUNTER — Other Ambulatory Visit: Payer: Self-pay

## 2021-07-11 ENCOUNTER — Inpatient Hospital Stay: Payer: 59

## 2021-07-11 ENCOUNTER — Other Ambulatory Visit: Payer: Self-pay | Admitting: Physician Assistant

## 2021-07-11 ENCOUNTER — Ambulatory Visit (HOSPITAL_COMMUNITY)
Admission: RE | Admit: 2021-07-11 | Discharge: 2021-07-11 | Disposition: A | Discharge status: Home / Self Care | Payer: 59 | Source: Ambulatory Visit | Attending: Physician Assistant | Admitting: Physician Assistant

## 2021-07-11 ENCOUNTER — Other Ambulatory Visit: Payer: Self-pay | Admitting: *Deleted

## 2021-07-11 VITALS — BP 115/72 | HR 67 | Temp 98.4°F | Resp 18 | Ht 64.0 in | Wt 115.2 lb

## 2021-07-11 DIAGNOSIS — Z86718 Personal history of other venous thrombosis and embolism: Secondary | ICD-10-CM

## 2021-07-11 DIAGNOSIS — C787 Secondary malignant neoplasm of liver and intrahepatic bile duct: Secondary | ICD-10-CM

## 2021-07-11 DIAGNOSIS — M79602 Pain in left arm: Secondary | ICD-10-CM | POA: Diagnosis present

## 2021-07-11 DIAGNOSIS — Z5112 Encounter for antineoplastic immunotherapy: Secondary | ICD-10-CM | POA: Diagnosis not present

## 2021-07-11 DIAGNOSIS — M79601 Pain in right arm: Secondary | ICD-10-CM

## 2021-07-11 DIAGNOSIS — C50511 Malignant neoplasm of lower-outer quadrant of right female breast: Secondary | ICD-10-CM

## 2021-07-11 DIAGNOSIS — Z170 Estrogen receptor positive status [ER+]: Secondary | ICD-10-CM

## 2021-07-11 LAB — COMPREHENSIVE METABOLIC PANEL
ALT: 20 U/L (ref 0–44)
AST: 24 U/L (ref 15–41)
Albumin: 4.1 g/dL (ref 3.5–5.0)
Alkaline Phosphatase: 83 U/L (ref 38–126)
Anion gap: 8 (ref 5–15)
BUN: 20 mg/dL (ref 6–20)
CO2: 28 mmol/L (ref 22–32)
Calcium: 9.4 mg/dL (ref 8.9–10.3)
Chloride: 103 mmol/L (ref 98–111)
Creatinine, Ser: 1.08 mg/dL — ABNORMAL HIGH (ref 0.44–1.00)
GFR, Estimated: >60 mL/min (ref >60)
Glucose, Bld: 94 mg/dL (ref 70–99)
Potassium: 4.5 mmol/L (ref 3.5–5.1)
Sodium: 139 mmol/L (ref 135–145)
Total Bilirubin: 0.4 mg/dL (ref 0.3–1.2)
Total Protein: 6.9 g/dL (ref 6.5–8.1)

## 2021-07-11 LAB — CBC WITH DIFFERENTIAL/PLATELET
Abs Immature Granulocytes: 0 10*3/uL (ref 0.00–0.07)
Basophils Absolute: 0.1 10*3/uL (ref 0.0–0.1)
Basophils Relative: 3 %
Eosinophils Absolute: 0.1 10*3/uL (ref 0.0–0.5)
Eosinophils Relative: 2 %
HCT: 41 % (ref 36.0–46.0)
Hemoglobin: 13.6 g/dL (ref 12.0–15.0)
Immature Granulocytes: 0 %
Lymphocytes Relative: 36 %
Lymphs Abs: 1.2 10*3/uL (ref 0.7–4.0)
MCH: 33.7 pg (ref 26.0–34.0)
MCHC: 33.2 g/dL (ref 30.0–36.0)
MCV: 101.5 fL — ABNORMAL HIGH (ref 80.0–100.0)
Monocytes Absolute: 0.2 10*3/uL (ref 0.1–1.0)
Monocytes Relative: 6 %
Neutro Abs: 1.8 10*3/uL (ref 1.7–7.7)
Neutrophils Relative %: 53 %
Platelets: 240 10*3/uL (ref 150–400)
RBC: 4.04 MIL/uL (ref 3.87–5.11)
RDW: 11.5 % (ref 11.5–15.5)
WBC: 3.4 10*3/uL — ABNORMAL LOW (ref 4.0–10.5)
nRBC: 0 % (ref 0.0–0.2)

## 2021-07-11 LAB — PROTIME-INR
INR: 1.3 — ABNORMAL HIGH (ref 0.8–1.2)
Prothrombin Time: 15.9 seconds — ABNORMAL HIGH (ref 11.4–15.2)

## 2021-07-11 IMAGING — DX DG HUMERUS 2V *R*
2 series · 2 of 2 positions shown · non-contrast
Comparison: None.

CLINICAL DATA: Bilateral upper arm/humeral pain. Pain for 2 weeks.
No known injury. History of breast cancer.

EXAM:
RIGHT HUMERUS - 2+ VIEW

[humerus ap]
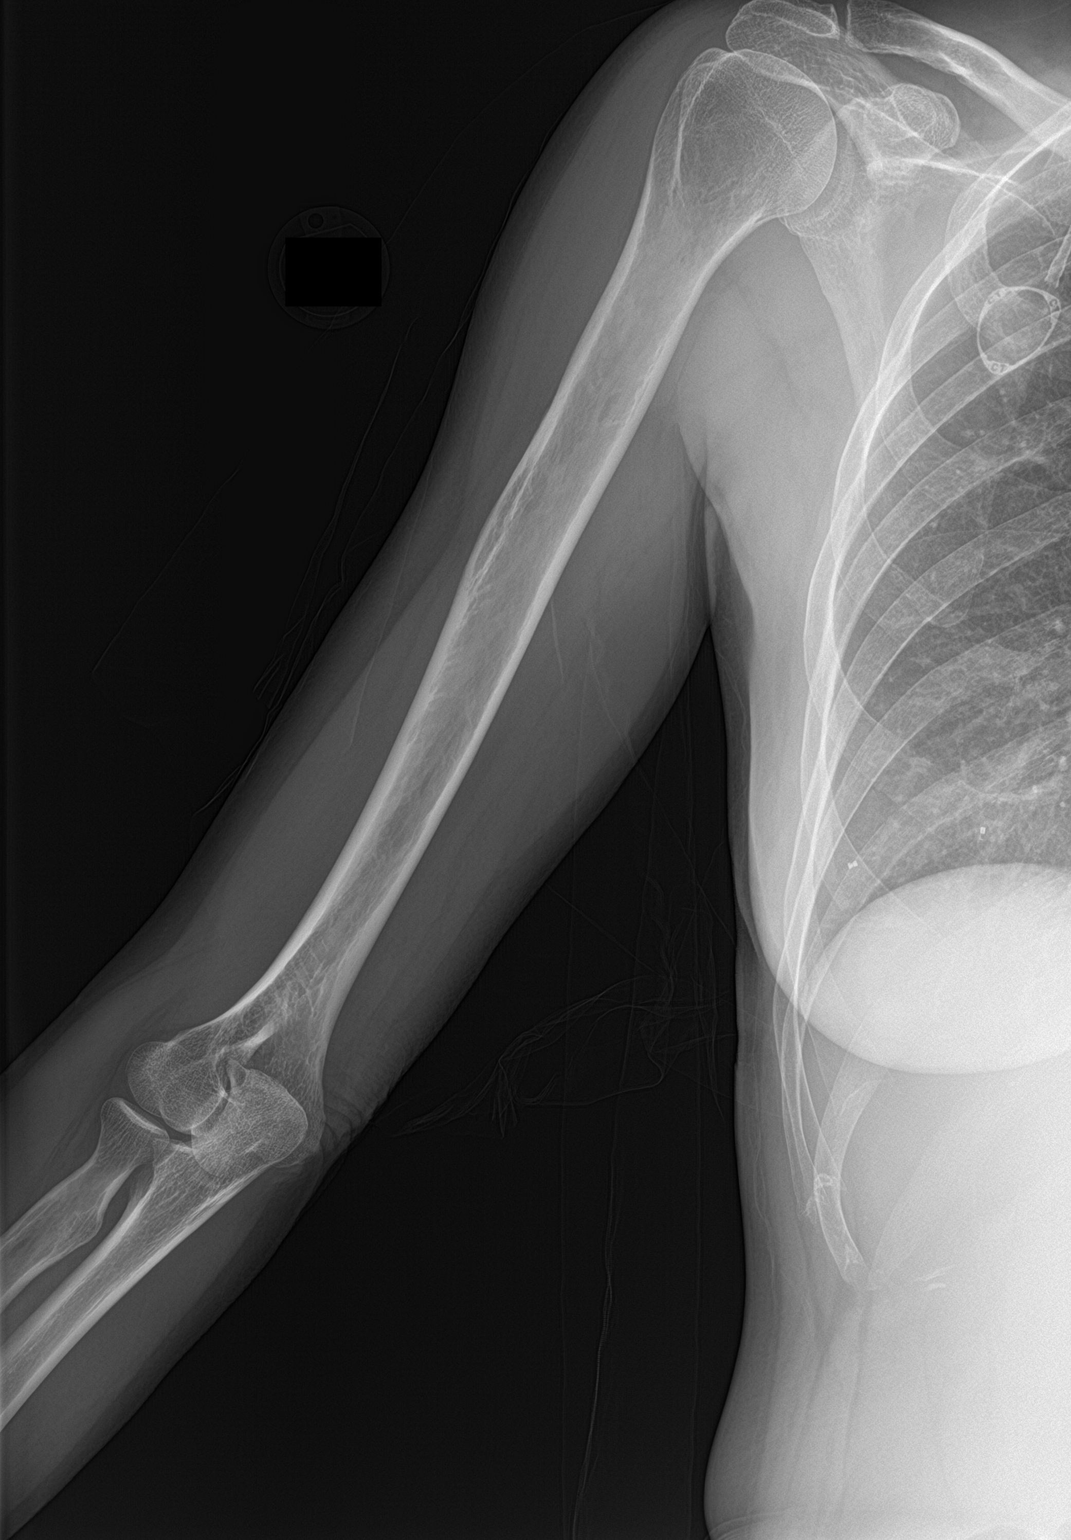

[humerus lat]
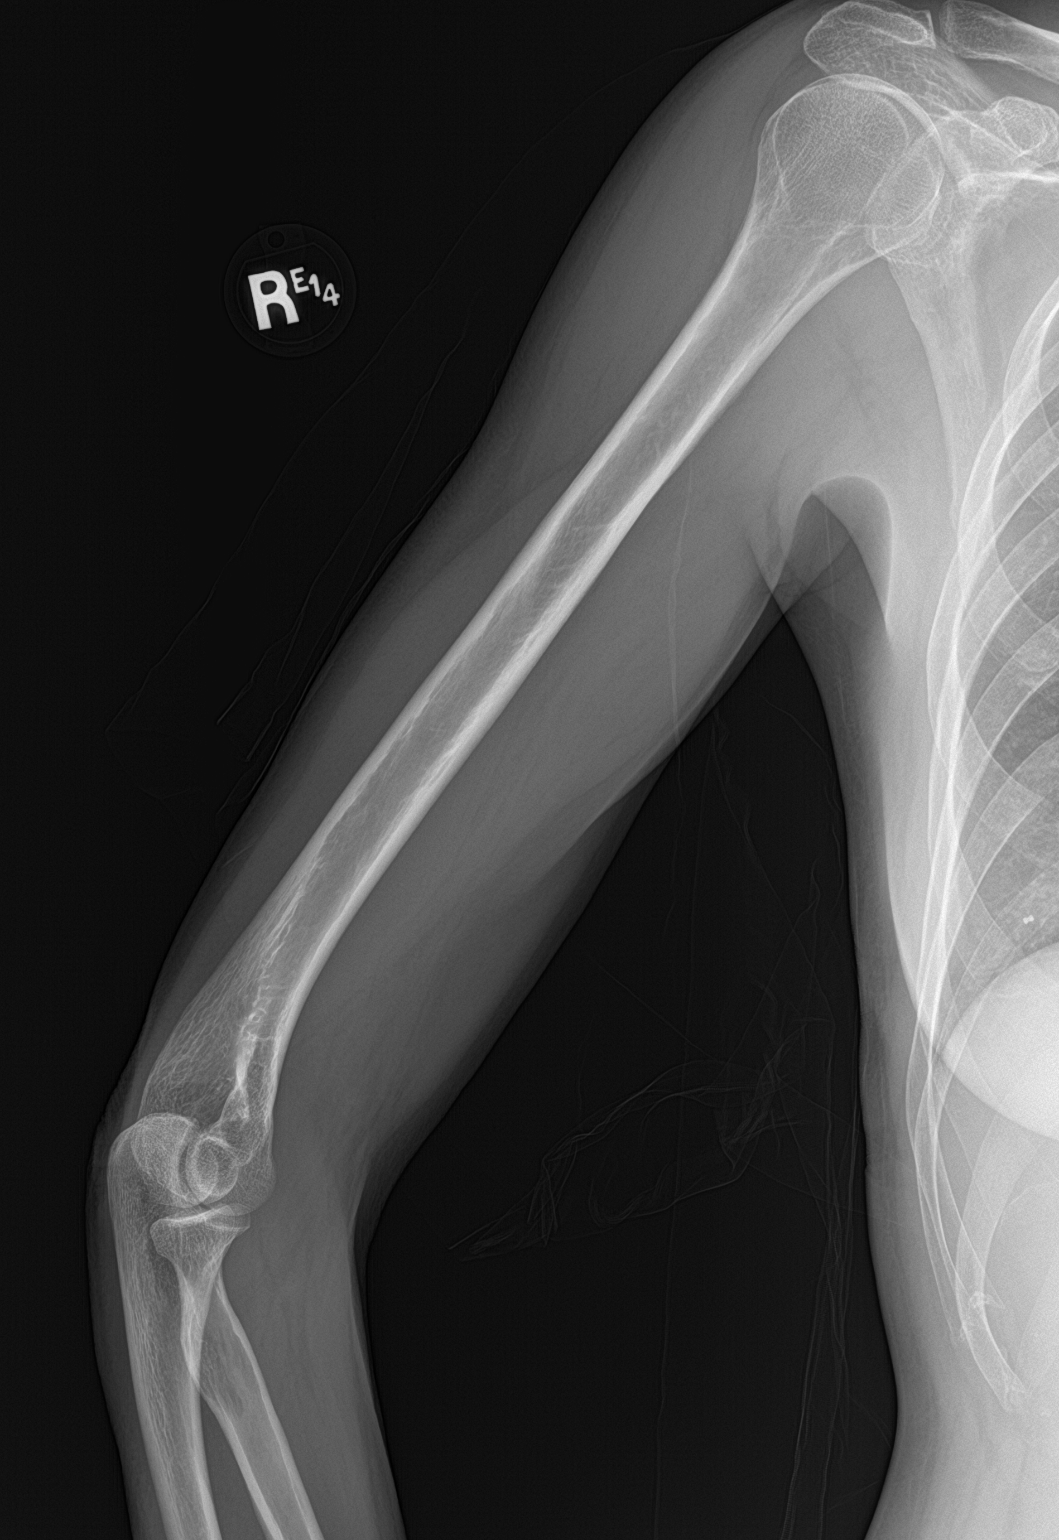

[2 of 2 positions shown; findings below may reference images not displayed]

FINDINGS: The cortical margins of the humerus are intact. There is no evidence
of fracture or other focal bone lesions. Shoulder and elbow
alignment are maintained. Soft tissues are unremarkable.
IMPRESSION: Negative radiographs of the right humerus.

## 2021-07-11 IMAGING — DX DG HUMERUS 2V *L*
2 series · 2 of 2 positions shown · non-contrast
Comparison: None.

CLINICAL DATA: Bilateral arm pain. Humeral pain for 2 weeks.
History of breast cancer.

EXAM:
LEFT HUMERUS - 2+ VIEW

[humerus ap]
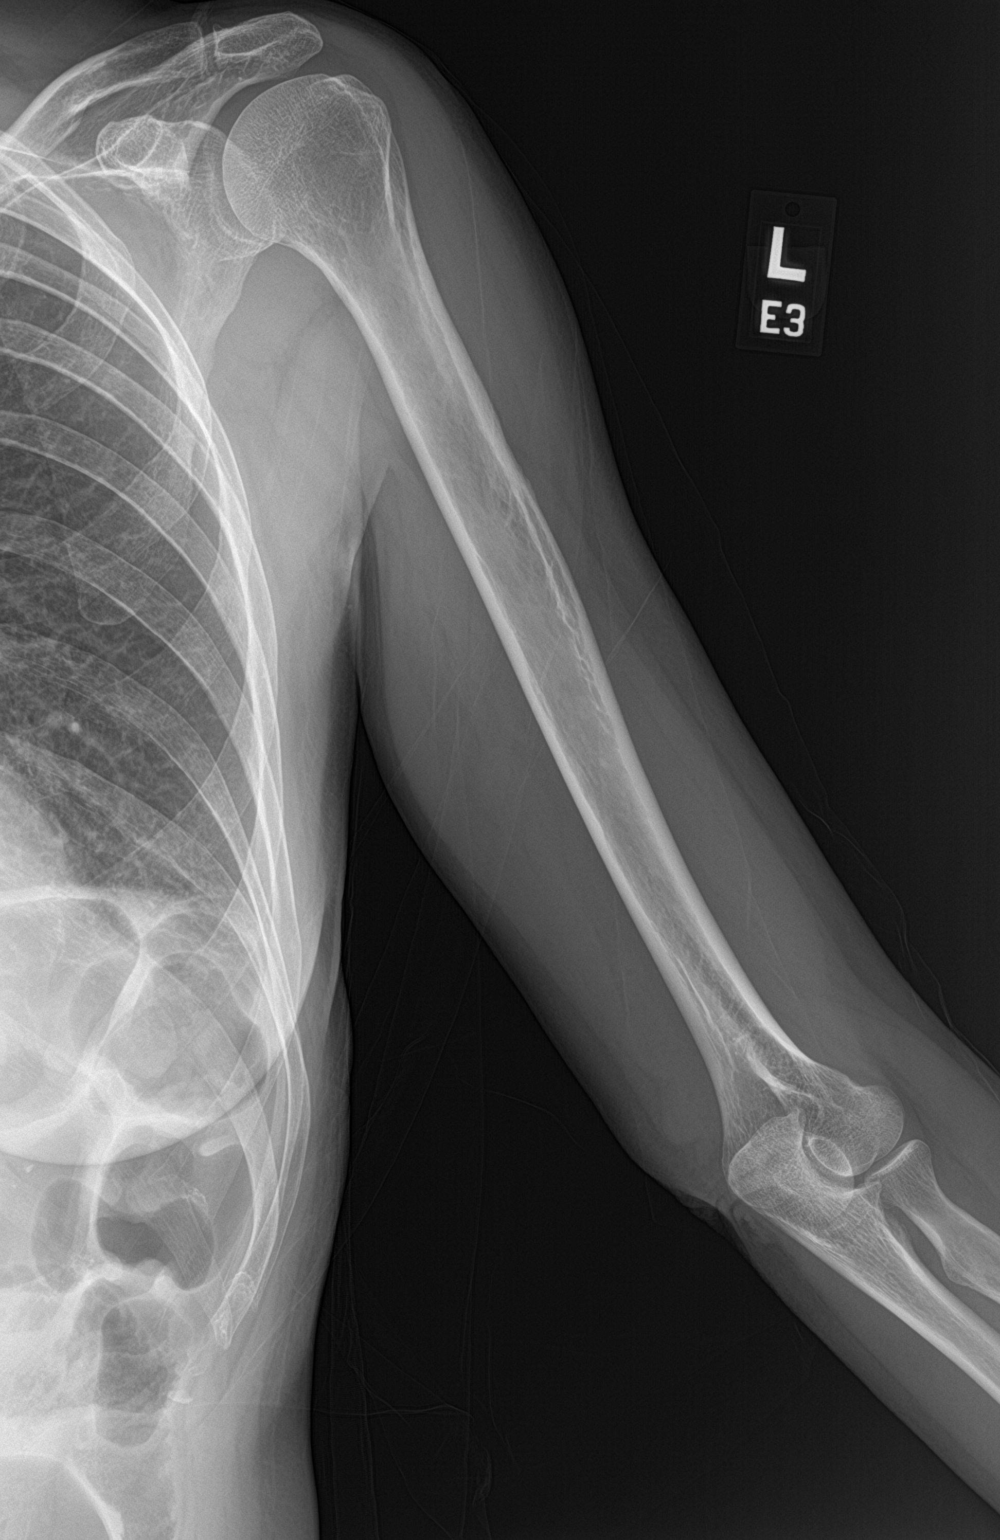

[humerus lat]
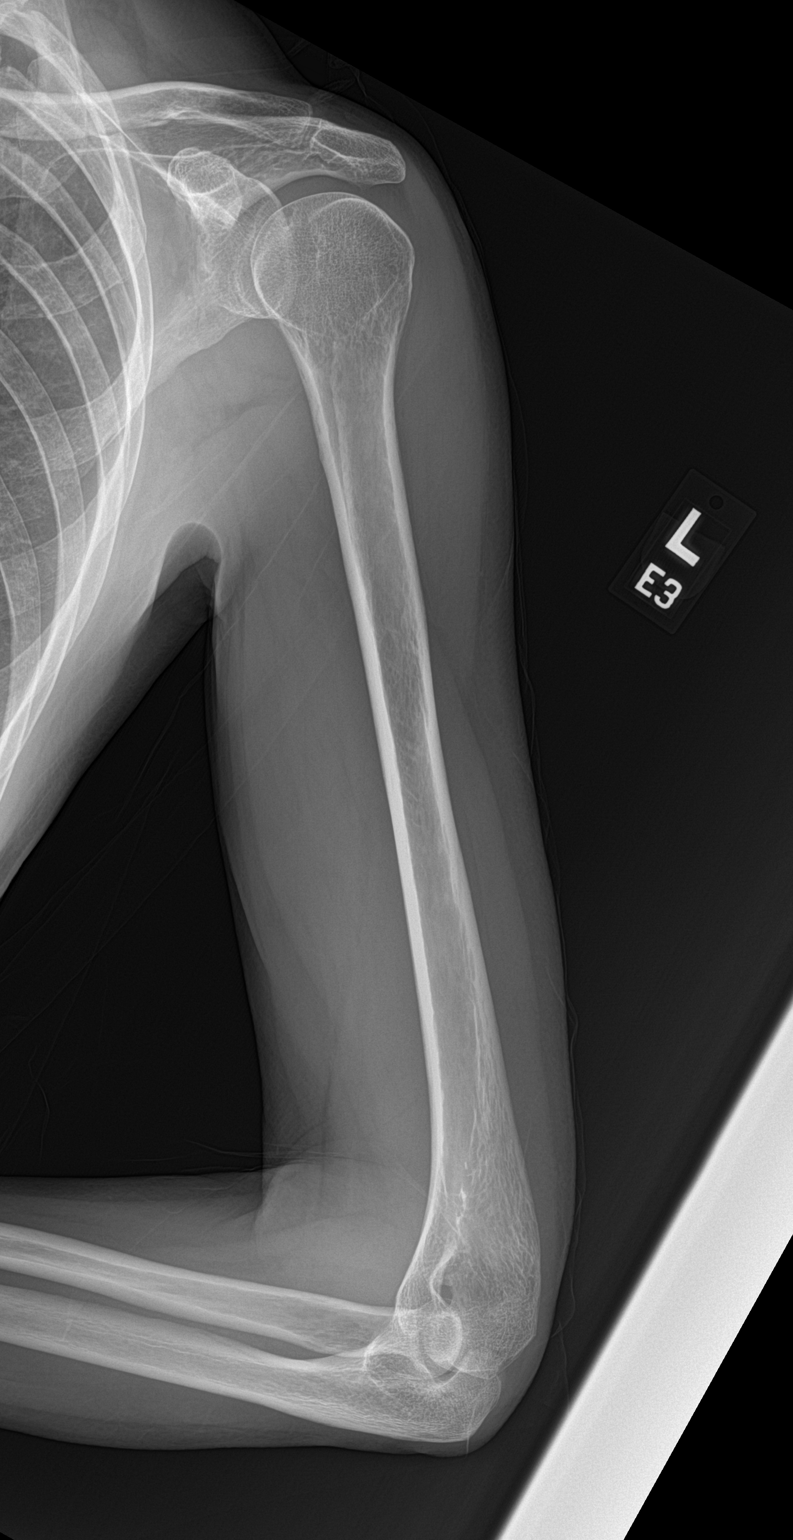

[2 of 2 positions shown; findings below may reference images not displayed]

FINDINGS: The cortical margins of the humerus are intact. There is no evidence
of fracture or other focal bone lesions. Shoulder and elbow
alignment are maintained. Soft tissues are unremarkable.
IMPRESSION: Negative radiographs of the left humerus.

## 2021-07-11 MED ORDER — TRASTUZUMAB-ANNS CHEMO 150 MG IV SOLR
6.0000 mg/kg | Freq: Once | INTRAVENOUS | Status: AC
  Administered 2021-07-11: 315 mg via INTRAVENOUS
  Filled 2021-07-11: qty 15

## 2021-07-11 MED ORDER — SODIUM CHLORIDE 0.9% FLUSH
10.0000 mL | INTRAVENOUS | Status: DC | PRN
  Administered 2021-07-11: 10 mL

## 2021-07-11 MED ORDER — HEPARIN SOD (PORK) LOCK FLUSH 100 UNIT/ML IV SOLN
500.0000 [IU] | Freq: Once | INTRAVENOUS | Status: AC | PRN
  Administered 2021-07-11: 500 [IU]

## 2021-07-11 MED ORDER — AMPHETAMINE-DEXTROAMPHET ER 20 MG PO CP24: 20.0000 mg | ORAL_CAPSULE | Freq: Every day | ORAL | 0 refills | Status: DC

## 2021-07-11 MED ORDER — DIPHENHYDRAMINE HCL 25 MG PO CAPS
25.0000 mg | ORAL_CAPSULE | Freq: Once | ORAL | Status: AC
  Administered 2021-07-11: 25 mg via ORAL
  Filled 2021-07-11: qty 1

## 2021-07-11 MED ORDER — SODIUM CHLORIDE 0.9 % IV SOLN: Freq: Once | INTRAVENOUS | Status: AC

## 2021-07-11 MED ORDER — ACETAMINOPHEN 325 MG PO TABS
650.0000 mg | ORAL_TABLET | Freq: Once | ORAL | Status: AC
  Administered 2021-07-11: 650 mg via ORAL
  Filled 2021-07-11: qty 2

## 2021-07-11 NOTE — Patient Instructions (Signed)
[facility]  Discharge Instructions: °Thank you for choosing [facility] to provide your oncology and hematology care.  ° °If you have a lab appointment with the Cancer Center, please go directly to the Cancer Center and check in at the registration area. °  °Wear comfortable clothing and clothing appropriate for easy access to any Portacath or PICC line.  ° °We strive to give you quality time with your provider. You may need to reschedule your appointment if you arrive late (15 or more minutes).  Arriving late affects you and other patients whose appointments are after yours.  Also, if you miss three or more appointments without notifying the office, you may be dismissed from the clinic at the provider’s discretion.    °  °For prescription refill requests, have your pharmacy contact our office and allow 72 hours for refills to be completed.   ° °Today you received the following chemotherapy and/or immunotherapy agents: Kanjinti °  °To help prevent nausea and vomiting after your treatment, we encourage you to take your nausea medication as directed. ° °BELOW ARE SYMPTOMS THAT SHOULD BE REPORTED IMMEDIATELY: °*FEVER GREATER THAN 100.4 F (38 °C) OR HIGHER °*CHILLS OR SWEATING °*NAUSEA AND VOMITING THAT IS NOT CONTROLLED WITH YOUR NAUSEA MEDICATION °*UNUSUAL SHORTNESS OF BREATH °*UNUSUAL BRUISING OR BLEEDING °*URINARY PROBLEMS (pain or burning when urinating, or frequent urination) °*BOWEL PROBLEMS (unusual diarrhea, constipation, pain near the anus) °TENDERNESS IN MOUTH AND THROAT WITH OR WITHOUT PRESENCE OF ULCERS (sore throat, sores in mouth, or a toothache) °UNUSUAL RASH, SWELLING OR PAIN  °UNUSUAL VAGINAL DISCHARGE OR ITCHING  ° °Items with * indicate a potential emergency and should be followed up as soon as possible or go to the Emergency Department if any problems should occur. ° °Please show the CHEMOTHERAPY ALERT CARD or IMMUNOTHERAPY ALERT CARD at check-in to the  Emergency Department and triage nurse. ° °Should you have questions after your visit or need to cancel or reschedule your appointment, please contact [facility]  Dept: [phone]  and follow the prompts.  Office hours are 8:00 a.m. to 4:30 p.m. Monday - Friday. Please note that voicemails left after 4:00 p.m. may not be returned until the following business day.  We are closed weekends and major holidays. You have access to a nurse at all times for urgent questions. Please call the main number to the clinic Dept: [phone] and follow the prompts. ° ° °For any non-urgent questions, you may also contact your provider using MyChart. We now offer e-Visits for anyone 18 and older to request care online for non-urgent symptoms. For details visit [email or web address]. °  °Also download the MyChart app! Go to the app store, search "MyChart", open the app, select [facility], and log in with your MyChart username and password. ° °Due to Covid, a mask is required upon entering the hospital/clinic. If you do not have a mask, one will be given to you upon arrival. For doctor visits, patients may have 1 support person aged 18 or older with them. For treatment visits, patients cannot have anyone with them due to current Covid guidelines and our immunocompromised population.  ° °

## 2021-07-11 NOTE — Progress Notes (Signed)
I saw Erica Wood at the request of Dr. Lindi Adie as she was complaining of two week history of bilateral upper arm pain, left greater than right. She reports the pain is only with movement and rates the pain as high as 7 out of 10 on a pain scale. She denies any trauma that caused the pain. She denies skin changes, swelling, neuropathy to the upper extremities.  She has history  of lupus but patient reports symptoms are not related to lupus.  She has not taken anything for pain.   I examined the patient in the infusion center.  There are no gross abnormalities in her upper extremities including erythema or edema.  Able to ambulate her arms without any limitations.  No evidence of upper extremity weakness.  The pain is likely musculoskeletal but will proceed with x-rays of bilateral arms to further evaluate.  She is aware to follow-up in the clinic in 1 week if pain does not improve.

## 2021-07-11 NOTE — Telephone Encounter (Signed)
Patient called requesting prescription refill of Adderall to be sent to [facility] at [address].   

## 2021-07-11 NOTE — Telephone Encounter (Signed)
Last Visit: 05/29/2021  Next Visit: 07/31/2021   Last Fill: 06/08/2021  Current Dose per office note 05/29/2021: she has been taking Adderall for fatigue.  Have advised her to taper off Adderall.  Okay to refill Adderall?

## 2021-07-12 LAB — CANCER ANTIGEN 27.29: CA 27.29: 65.8 U/mL — ABNORMAL HIGH (ref 0.0–38.6)

## 2021-07-13 ENCOUNTER — Telehealth: Payer: Self-pay

## 2021-07-13 NOTE — Telephone Encounter (Signed)
Pts states the arm muscle pain has improved but her left shoulder hurts when she "twists or rotates" her arm.

## 2021-07-13 NOTE — Telephone Encounter (Signed)
-----   Message from Lincoln Brigham, PA-C sent at 07/13/2021  2:11 PM EDT ----- Jeannette Corpus: Can you call patient and let her know that her xrays were unremarkable. Please call and see if she is still having upper arm pain she reported earlier this week.

## 2021-07-17 ENCOUNTER — Telehealth: Payer: Self-pay | Admitting: *Deleted

## 2021-07-17 NOTE — Telephone Encounter (Signed)
This RN returned VM left by the patient stating she is continuing to have discomfort in her shoulders -left greater then right.  She stated also she was called Friday with results of xrays showing no issues.  Per return call this RN obtained her identified VM- message left to return call with this RN's name and office number for return call.

## 2021-07-18 ENCOUNTER — Inpatient Hospital Stay: Payer: 59 | Attending: Oncology | Admitting: Adult Health

## 2021-07-18 ENCOUNTER — Other Ambulatory Visit: Payer: Self-pay

## 2021-07-18 VITALS — BP 129/74 | HR 67 | Temp 97.2°F | Resp 18 | Ht 64.0 in | Wt 115.1 lb

## 2021-07-18 DIAGNOSIS — M25511 Pain in right shoulder: Secondary | ICD-10-CM | POA: Insufficient documentation

## 2021-07-18 DIAGNOSIS — C787 Secondary malignant neoplasm of liver and intrahepatic bile duct: Secondary | ICD-10-CM | POA: Diagnosis present

## 2021-07-18 DIAGNOSIS — C50511 Malignant neoplasm of lower-outer quadrant of right female breast: Secondary | ICD-10-CM

## 2021-07-18 DIAGNOSIS — M25512 Pain in left shoulder: Secondary | ICD-10-CM | POA: Diagnosis not present

## 2021-07-18 DIAGNOSIS — Z5111 Encounter for antineoplastic chemotherapy: Secondary | ICD-10-CM | POA: Insufficient documentation

## 2021-07-18 DIAGNOSIS — Z86718 Personal history of other venous thrombosis and embolism: Secondary | ICD-10-CM | POA: Diagnosis not present

## 2021-07-18 DIAGNOSIS — Z170 Estrogen receptor positive status [ER+]: Secondary | ICD-10-CM | POA: Diagnosis not present

## 2021-07-18 DIAGNOSIS — L930 Discoid lupus erythematosus: Secondary | ICD-10-CM

## 2021-07-18 DIAGNOSIS — M79621 Pain in right upper arm: Secondary | ICD-10-CM | POA: Diagnosis not present

## 2021-07-18 DIAGNOSIS — M79622 Pain in left upper arm: Secondary | ICD-10-CM | POA: Insufficient documentation

## 2021-07-18 NOTE — Progress Notes (Signed)
Erica Wood  [phone]) [phone] Fax:(336) [phone]     ID: Erica Wood DOB: [birth year]  MR#: [id number]  PPJ#:[id number]  Patient Care Team: Eunice Blase, MD as PCP - General (Family Medicine) Prudence Davidson, Robertson Bing, MD as Referring Physician (Oncology) Rockwell Germany, RN as Oncology Nurse Navigator Mauro Kaufmann, RN as Oncology Nurse Navigator Rolm Bookbinder, MD as Consulting Physician (General Surgery) Magrinat, Virgie Dad, MD as Consulting Physician (Oncology) Kyung Rudd, MD as Consulting Physician (Radiation Oncology) Bo Merino, MD as Consulting Physician (Rheumatology) Olga Millers, MD as Consulting Physician (Obstetrics and Gynecology) Larey Dresser, MD as Consulting Physician (Cardiology) Armbruster, Carlota Raspberry, MD as Consulting Physician (Gastroenterology) Nena Jordan, MD as Referring Physician (Ophthalmology) Nena Jordan, MD as Referring Physician (Ophthalmology) Scot Dock, NP OTHER MD:  CHIEF COMPLAINT: triple positive stage IV breast cancer  CURRENT TREATMENT: herceptin; anastrozole, palbociclib; coumadin   INTERVAL HISTORY: Erica Wood returns today for follow up and treatment of her triple positive breast cancer.  Erica Wood continues on Herceptin, anastrozole, palbociclib and coumadin.  Her coumadin is managed by her PCP Dr. Junius Roads.  She notes that about 2 weeks ago she began having bilateral arm pain in both of her biceps, left greater than right.  She has been unable to lift her left arm.  Pain is not improving with tylenol and using heat/cold.  She was seen last week and bilateral humerus xrays were completed that were negative.  This viewed most of the shoulder joint.  She also has lupus managed by Dr. Estanislado Pandy.  She is taking Plaquenil daily.   Erica Wood notes the pain in her shoulder and upper arms has worsened, and she is having difficulty  with lifting her arms, in particular her left without pain.  She denies  any focal weakness to one arm or the other, and also denies any numbness, tingling, or swelling to the arms/fingers.  She denies a precipitating event.  She has tramadol at home, but she hasn't tried it at this point for the pain.    Lab Results  Component Value Date   CA2729 65.8 (H) 07/11/2021   CA2729 68.9 (H) 06/20/2021   CA2729 69.2 (H) 05/30/2021   CA2729 76.3 (H) 05/09/2021   CA2729 69.4 (H) 04/18/2021    REVIEW OF SYSTEMS: Review of Systems  Constitutional:  Negative for appetite change, chills, fatigue, fever and unexpected weight change.  HENT:   Negative for hearing loss, lump/mass and trouble swallowing.   Eyes:  Negative for eye problems and icterus.  Respiratory:  Negative for chest tightness, cough and shortness of breath.   Cardiovascular:  Negative for chest pain, leg swelling and palpitations.  Gastrointestinal:  Negative for abdominal distention, abdominal pain, constipation, diarrhea, nausea and vomiting.  Endocrine: Negative for hot flashes.  Genitourinary:  Negative for difficulty urinating.   Musculoskeletal:  Positive for arthralgias. Negative for neck pain.  Skin:  Negative for itching and rash.  Neurological:  Negative for dizziness, extremity weakness, headaches and numbness.  Hematological:  Negative for adenopathy. Does not bruise/bleed easily.  Psychiatric/Behavioral:  Negative for depression. The patient is not nervous/anxious.      COVID 19 VACCINATION STATUS: [place] x3, most recently 08/2020   HISTORY OF CURRENT ILLNESS: From the original intake note:  Scherrie herself palpated an abnormality in the right breast several years ago.  This had not changed until sometime in June 2021.  She was evaluated by Dr. Ouida Sills and underwent right diagnostic mammography with tomography and  right breast ultrasonography at [facility] on 07/26/2020 showing: breast density category D; 2.6 cm mass in right breast at 6 o'clock, at site of palpable concern; two focal  groups of punctate calcifications posterior to palpable mass; no right axillary adenopathy.  Accordingly on 08/04/2020 she proceeded to biopsy of the right breast area in question. The pathology from this procedure ([id number]) showed: invasive ductal carcinoma, grade 3. Prognostic indicators significant for: estrogen receptor, 95% positive with moderate staining intensity and progesterone receptor, 10% positive with strong staining intensity. Proliferation marker Ki67 at 40%. HER2 equivocal by immunohistochemistry (2+), but positive by fluorescent in situ hybridization with a signals ratio 3.23 and number per cell 6.45.  Biopsy of the posterior calcifications was benign.  She also underwent breast MRI on 08/11/2020 showing: breast composition D; 2.6 cm biopsy-proven malignancy in right breast at 6 o'clock, with probable skin involvement noted on previous ultrasound; additional enhancing masses within right breast-- 5 mm in lower-outer, 1.2 cm in upper-inner, 7 mm in upper-outer; no evidence of left breast malignancy or lymphadenopathy; heterogeneous enhancement of sternum, of uncertain significance.  The patient's subsequent history is as detailed below.   PAST MEDICAL HISTORY: Past Medical History:  Diagnosis Date   Anti-cardiolipin antibody positive    Anti-cardiolipin antibody syndrome (HCC)    Anxiety    Arthralgia    Arthritis    bil knees, hands   Atypical chest pain    Autoimmune disease ([place])    Breast cancer metastasized to liver ([place]) 09/2020   Cancer ([place]) 07/2020   right breast IDC   Chronic fatigue    Depression    DVT (deep venous thrombosis) (HCC)    IBS (irritable bowel syndrome)    with constipation   Lupus ([place])    Pulmonary embolus (HCC)    Raynaud's syndrome    Sicca ([place])    Thrombocytopenia ([place])     PAST SURGICAL HISTORY: Past Surgical History:  Procedure Laterality Date   ABLATION  12/2018   leg veins   ABLATION Right 08/2019   venous leg     APPENDECTOMY     COLON SURGERY     Perforated colon repair after colostomy   EYE SURGERY Bilateral 2022   GANGLION CYST EXCISION     HYSTEROSCOPY WITH D & C N/A 01/13/2014   Procedure: DILATATION AND CURETTAGE /HYSTEROSCOPY with resection ;  Surgeon: Olga Millers, MD;  Location: [facility];  Service: Gynecology;  Laterality: N/A;   LAPAROTOMY     PORTACATH PLACEMENT N/A 09/13/2020   Procedure: INSERTION PORT-A-CATH WITH ULTRASOUND GUIDANCE;  Surgeon: Rolm Bookbinder, MD;  Location: [facility];  Service: General;  Laterality: N/A;    FAMILY HISTORY: Family History  Problem Relation Age of Onset   Rheum arthritis Father    Hemachromatosis Father    Heart Problems Father    Cancer Sister        bone    Colon cancer Neg Hx    Stomach cancer Neg Hx    Esophageal cancer Neg Hx    Pancreatic cancer Neg Hx    Liver disease Neg Hx   as of October 2021 her father isage 41 and her mother age 6.. The patient had one sister (and no brothers). The sister was diagnosed with Ewing's sarcoma at age 103, and died from the disease after many difficult treatments.    GYNECOLOGIC HISTORY:  Last menstrual period occurred about 6 months prior to the start of chemotherapy Menarche: 13 or  55 years old GX P 0 LMP around 2019 Contraceptive: yes HRT no  Hysterectomy? no BSO? 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 She is widowed. She lives at home with her cat, Mariea Clonts. She is not a Ambulance person.    ADVANCED DIRECTIVES: not in place; at the 08/16/2020 visit the patient was given the appropriate documents to complete and notarized at her discretion.     HEALTH MAINTENANCE: Social History   Tobacco Use   Smoking status: Former    Packs/day: 0.50    Years: 5.00    Pack years: 2.50    Types: Cigarettes    Start date: 08/06/2006    Quit date: 08/07/2011    Years since quitting: 9.9   Smokeless tobacco: Never  Vaping Use    Vaping Use: Never used  Substance Use Topics   Alcohol use: Yes    Comment: occasionally   Drug use: No     Colonoscopy: 2002 for perforated bowel  PAP: 03/2020  Bone density: done at Dr. Keane [facility] office   Allergies  Allergen Reactions   Beef-Derived Products Hives   Latex Rash    Contact   Sulfa Antibiotics Rash    Current Outpatient Medications  Medication Sig Dispense Refill   acetaminophen (TYLENOL) 500 MG tablet Take 1,000 mg by mouth every 6 (six) hours as needed for moderate pain or headache.     amphetamine-dextroamphetamine (ADDERALL XR) 20 MG 24 hr capsule Take 1 capsule (20 mg total) by mouth daily. 30 capsule 0   anastrozole (ARIMIDEX) 1 MG tablet Take 1 tablet (1 mg total) by mouth daily. 90 tablet 4   Ascorbic Acid (VITAMIN C PO) Take 3,000 mg by mouth daily.      Cholecalciferol (VITAMIN D3) 75 MCG (3000 UT) TABS Take 3,000 Units by mouth daily.     Clindamycin-Benzoyl Per, Refr, gel Apply topically.     hydroxychloroquine (PLAQUENIL) 200 MG tablet Take 1 tablet (200 mg total) by mouth daily. 90 tablet 4   lidocaine-prilocaine (EMLA) cream Apply topically.     metoCLOPramide (REGLAN) 10 MG tablet Take 5-10 mg by mouth as needed.     neomycin-polymyxin b-dexamethasone (MAXITROL) 3.5-10000-0.1 SUSP 1 drop 2 (two) times daily.     palbociclib (IBRANCE) 100 MG tablet Take 1 tablet (100 mg total) by mouth daily. Take for 21 days on, 7 days off, repeat every 28 days. 21 tablet 6   RESTASIS MULTIDOSE 0.05 % ophthalmic emulsion Place 1 drop into both eyes 2 (two) times daily. 11 mL 5   spironolactone (ALDACTONE) 25 MG tablet Take 25 mg by mouth daily as needed (swelling).     traMADol (ULTRAM) 50 MG tablet Take 1-2 tablets (50-100 mg total) by mouth every 6 (six) hours as needed. (Patient taking differently: Take 50-100 mg by mouth every 6 (six) hours as needed for moderate pain.) 60 tablet 0   tretinoin (RETIN-A) 0.1 % cream Apply 1 application topically at bedtime as needed  (acne).      triamterene-hydrochlorothiazide (DYAZIDE) 37.5-25 MG capsule TAKE 1 CAPSULE BY MOUTH DAILY AS NEEDED FOR SWELLING 90 capsule 0   warfarin (COUMADIN) 5 MG tablet TAKE 1 TABLET(5 MG) BY MOUTH DAILY 30 tablet PRN   No current facility-administered medications for this visit.    OBJECTIVE: Erica Wood who appears stated age  27:   07/18/21 0936  BP: 129/74  Pulse: 67  Resp: 18  Temp: (!) 97.2 F (36.2 C)  SpO2: 100%     Body mass index is 19.76 kg/m.   Wt Readings from Last 3 Encounters:  07/18/21 115 lb 1.6 oz (52.2 kg)  07/11/21 115 lb 4 oz (52.3 kg)  06/20/21 114 lb 8 oz (51.9 kg)      ECOG FS:2 - Symptomatic, <50% confined to bed GENERAL: Patient is a well appearing female in no acute distress HEENT:  Sclerae anicteric.  Mask in place. Neck is supple.  NODES:  No cervical, supraclavicular, or axillary lymphadenopathy palpated.  BREAST EXAM:  Deferred. LUNGS:  Clear to auscultation bilaterally.  No wheezes or rhonchi. HEART:  Regular rate and rhythm. No murmur appreciated. ABDOMEN:  Soft, nontender.  Positive, normoactive bowel sounds. No organomegaly palpated. MSK:  No focal spinal tenderness to palpation. Full range of motion bilaterally in the upper extremities.  No TTP over clavicle, trapezius, glenohumeral joint bilaterally EXTREMITIES:  No peripheral edema.   SKIN:  Clear with no obvious rashes or skin changes. No nail dyscrasia. NEURO:  Nonfocal. Well oriented.  Appropriate affect.   LAB RESULTS:  CMP     Component Value Date/Time   NA 139 07/11/2021 0749   NA 137 06/01/2021 1047   K 4.5 07/11/2021 0749   CL 103 07/11/2021 0749   CO2 28 07/11/2021 0749   GLUCOSE 94 07/11/2021 0749   BUN 20 07/11/2021 0749   BUN 16 06/01/2021 1047   CREATININE 1.08 (H) 07/11/2021 0749   CREATININE 0.78 10/20/2020 1330   CALCIUM 9.4 07/11/2021 0749   PROT 6.9 07/11/2021 0749   PROT 6.6 06/01/2021 1047   ALBUMIN 4.1 07/11/2021 0749   ALBUMIN 4.4 06/01/2021  1047   AST 24 07/11/2021 0749   AST 19 09/21/2020 0827   ALT 20 07/11/2021 0749   ALT 27 09/21/2020 0827   ALKPHOS 83 07/11/2021 0749   BILITOT 0.4 07/11/2021 0749   BILITOT 0.3 06/01/2021 1047   BILITOT 0.4 09/21/2020 0827   GFRNONAA >60 07/11/2021 0749   GFRNONAA >60 10/20/2020 1330   GFRAA 95 07/28/2020 1108    No results found for: TOTALPROTELP, ALBUMINELP, A1GS, A2GS, BETS, BETA2SER, GAMS, MSPIKE, SPEI  Lab Results  Component Value Date   WBC 3.4 (L) 07/11/2021   NEUTROABS 1.8 07/11/2021   HGB 13.6 07/11/2021   HCT 41.0 07/11/2021   MCV 101.5 (H) 07/11/2021   PLT 240 07/11/2021    No results found for: LABCA2  No components found for: BZJIRC789  No results for input(s): INR in the last 168 hours.   No results found for: LABCA2  No results found for: CAN199  Lab Results  Component Value Date   FYB017 29.9 01/03/2021    No results found for: PZW258  Lab Results  Component Value Date   CA2729 65.8 (H) 07/11/2021    No components found for: HGQUANT  Lab Results  Component Value Date   CEA1 1.88 01/03/2021   /  CEA ([facility])  Date Value Ref Range Status  01/03/2021 1.88 0.00 - 5.00 ng/mL Final    Comment:    (NOTE) This test was performed using Architect's Chemiluminescent Microparticle Immunoassay. Values obtained from different assay methods cannot be used interchangeably. Please note that 5-10% of patients who smoke may see CEA levels up to 6.9 ng/mL. Performed at [facility], [place] [address], [place], [place] [address]      No results found for: AFPTUMOR  No results found for: [place]  No results found for: KPAFRELGTCHN, LAMBDASER, KAPLAMBRATIO (kappa/lambda  light chains)  No results found for: HGBA, HGBA2QUANT, HGBFQUANT, HGBSQUAN (Hemoglobinopathy evaluation)   No results found for: LDH  Lab Results  Component Value Date   IRON 88 10/29/2018   IRON 88 10/29/2018   TIBC 241 (L) 10/29/2018    TIBC 241 (L) 10/29/2018   IRONPCTSAT 37 10/29/2018   IRONPCTSAT 37 10/29/2018   (Iron and TIBC)  Lab Results  Component Value Date   FERRITIN 43 10/29/2018   FERRITIN 43 10/29/2018    Urinalysis    Component Value Date/Time   APPEARANCEUR Clear 06/26/2021 1331   GLUCOSEU Negative 06/26/2021 1331   BILIRUBINUR Negative 06/26/2021 1331   PROTEINUR Negative 06/26/2021 1331   NITRITE Negative 06/26/2021 1331   LEUKOCYTESUR Negative 06/26/2021 1331    STUDIES: DG Humerus Left  Result Date: 07/13/2021 CLINICAL DATA:  Bilateral arm pain. Humeral pain for 2 weeks. History of breast cancer. EXAM: LEFT HUMERUS - 2+ VIEW COMPARISON:  None. FINDINGS: The cortical margins of the humerus are intact. There is no evidence of fracture or other focal bone lesions. Shoulder and elbow alignment are maintained. Soft tissues are unremarkable. IMPRESSION: Negative radiographs of the left humerus. Electronically Signed   By: Keith Rake M.D.   On: 07/13/2021 14:03   DG Humerus Right  Result Date: 07/13/2021 CLINICAL DATA:  Bilateral upper arm/humeral pain. Pain for 2 weeks. No known injury. History of breast cancer. EXAM: RIGHT HUMERUS - 2+ VIEW COMPARISON:  None. FINDINGS: The cortical margins of the humerus are intact. There is no evidence of fracture or other focal bone lesions. Shoulder and elbow alignment are maintained. Soft tissues are unremarkable. IMPRESSION: Negative radiographs of the right humerus. Electronically Signed   By: Keith Rake M.D.   On: 07/13/2021 14:02     ELIGIBLE FOR AVAILABLE RESEARCH PROTOCOL: AET  ASSESSMENT: 55 y.o. Erica Wood status post right breast lower outer quadrant biopsy 08/04/2020 for a clinical T2 N0 M1, stage IV invasive ductal carcinoma, grade 3, triple positive, with an MIB-1 of 40%  (a) breast MRI 08/14/2020 shows 3 additional right breast lesions and heterogeneous enhancement of the sternum  (b) biopsy of one of the additional right breast masses  09/05/2020 showed atypical lobular hyperplasia (concordant).  (c) CT scan of the abdomen and pelvis 07/14/2020 shows 2 indeterminate liver lesions  (d) abdominal MRI 08/24/2020 shows multiple liver lesions highly suspicious for liver metastases.  (e) liver biopsy 09/20/2020 positive for carcinoma, prognostic panel estrogen and progesterone receptor positive, but HER-2 negative (0); MIB-1 of 30%  (f) bone scan 09/14/2020 shows no evidence of bone metastases  (g) CT scan of the chest and head 09/14/2020 showed no evidence of metastatic disease in the chest or brain  (h) CA 27-29 was 91.9 on 01/03/2021; CEA and CA-125 normal  (1) neoadjuvant chemotherapy consisting of docetaxel, carboplatin, trastuzumab and pertuzumab every 21 days x 6 started 08/31/2020, completed 12/14/2020  (a) pertuzumab omitted starting with cycle 4  (b) docetaxel omitted with final cycle, gemzar substituted  (2) anti-HER-2 immunotherapy (trastuzumab) Q 21 d to continue indefinitely  (a) echo 04/12/2020 shows an ejection fraction in the 60-65% range  (b) echo 09/25/2020 shows an ejection fraction in the 60 to 65% range  (c) echo 01/10/2021 shows an ejection fraction in the 60-65% range  (d) echo 04/24/2021 shows an ejection fraction in the 60-65% range  (3) definitive surgery to follow depending on optimal response to systemic therapy  (4) adjuvant radiation to follow surgery as appropriate  (5) anastrozole started  01/29/2021  (a) palbociclib/Ibrance started 01/29/2021  (b) palbociclib dose decreased to 100 mg beginning with May 2022 cycle due to cytopenias  (6) staging studies:  (a) post-NEO chemotherapy breast MRI 12/26/2020 showed right breast index lesion decreased to 0.8 cm; 2 additional masses in the right breast stable  (b) abdominal MRI 01/29/2021 shows no residual measurable disease in the liver  (c) breast MRI 05/01/2021 shows continuing response in the breast  (7) history LLE DVT/ PE 2003: on lifelong  anticoagulation (warfarin)   PLAN: Jonathon is having worsening pain in her arms bilaterally along with her shoulders left greater than right.  She has metastatic breast cancer, and we need to rule out a spine lesion on her neck that is causing this bilateral pain and rule out cord involvement.  I placed orders for MRI of the cervical spine.    For the pain, I recommended that she take tramadol if needed.  She says that she has enough and doesn't need more of this.  We will work to get this MRI authorized and scheduled.    I reviewed the above with Dr. Jana Hakim who is in agreement with this.  Total encounter time 30 minutes.Wilber Bihari, NP 07/18/21 9:57 AM Medical Oncology and Hematology [facility] [place], [place] [address] Tel. [phone]    Fax. [phone]    *Total Encounter Time as defined by the Centers for Medicare and Medicaid Services includes, in addition to the face-to-face time of a patient visit (documented in the note above) non-face-to-face time: obtaining and reviewing outside history, ordering and reviewing medications, tests or procedures, care coordination (communications with other health care professionals or caregivers) and documentation in the medical record.

## 2021-07-20 NOTE — Progress Notes (Signed)
Office Visit Note  Patient: Erica Wood             Date of Birth: Jan 16, 1966           MRN: [id number]             PCP: Eunice Blase, MD Referring: Eunice Blase, MD Visit Date: 07/31/2021 Occupation: _0 @  Subjective:  Pain in multiple joints   History of Present Illness: Erica Wood is a 55 y.o. female with history of systemic lupus erythematosus.  She is currently taking plaquenil 200 mg 1 tablet by mouth daily.  She continues to tolerate Plaquenil without any side effects.  She has been experiencing increased pain and inflammation in multiple joints including the left shoulder, both hands, and the right knee joint.  She states that the pain in her right shoulder has been radiating up to her neck.  She denies any numbness or tingling in her left arm.  She states that she is scheduled for an MRI of the C-spine on 08/09/2021.  She has been taking Tylenol as needed for pain relief and uses ice topically.  She takes tramadol very sparingly for severe pain. She denies any shortness of breath or palpitations.  She has not had any recent rashes or increased photosensitivity.  She denies any sicca symptoms or oral ulcerations.  She has had nasal ulcerations intermittently.  She continues to have chronic fatigue and generalized weakness which she attributes to being on immunotherapy prescribed by her oncologist.     Activities of Daily Living:  Patient reports stiffness all day Patient Reports nocturnal pain.  Difficulty dressing/grooming: Reports Difficulty climbing stairs: Reports Difficulty getting out of chair: Reports Difficulty using hands for taps, buttons, cutlery, and/or writing: Reports  Review of Systems  Constitutional:  Positive for fatigue.  HENT:  Positive for nose dryness (Nose sores). Negative for mouth sores and mouth dryness.   Eyes:  Negative for pain, visual disturbance and dryness.  Respiratory:  Negative for cough, hemoptysis, shortness of  breath and difficulty breathing.   Cardiovascular:  Negative for chest pain, palpitations, hypertension and swelling in legs/feet.  Gastrointestinal:  Negative for blood in stool, constipation and diarrhea.  Endocrine: Negative for increased urination.  Genitourinary:  Negative for painful urination.  Musculoskeletal:  Positive for joint pain, joint pain, joint swelling and morning stiffness. Negative for myalgias, muscle weakness, muscle tenderness and myalgias.  Skin:  Negative for color change, pallor, rash, hair loss, nodules/bumps, skin tightness, ulcers and sensitivity to sunlight.  Allergic/Immunologic: Negative for susceptible to infections.  Neurological:  Negative for dizziness, numbness, headaches and weakness.  Hematological:  Negative for swollen glands.  Psychiatric/Behavioral:  Negative for depressed mood and sleep disturbance. The patient is not nervous/anxious.    PMFS History:  Patient Active Problem List   Diagnosis Date Noted   Goals of care, counseling/discussion 05/09/2021   Liver metastases ([place]) 01/03/2021   Port-A-Cath in place 11/02/2020   Aortic atherosclerosis ([place]) 09/21/2020   Malignant neoplasm of lower-outer quadrant of right breast of female, estrogen receptor positive ([place]) 08/10/2020   Abnormal cervical Papanicolaou smear 07/12/2020   Pulmonary embolism ([place]) 07/12/2020   Fibrocystic disease of breast 01/22/2018   Primary osteoarthritis of both knees 05/02/2017   Raynaud's disease without gangrene 11/26/2016   Discoid lupus 11/26/2016   Anticardiolipin antibody positive 11/26/2016   Autoimmune disease ([place]) 11/25/2016   High risk medication use 11/25/2016   Primary osteoarthritis of both hands 11/25/2016   History of DVT (deep  vein thrombosis) 11/25/2016   History of pulmonary embolism 11/25/2016   Primary insomnia 11/25/2016   Anxiety 11/25/2016   Irritable bowel syndrome with constipation 11/25/2016   Raynaud's phenomenon 01/03/2016   Varicose  veins of lower extremities with other complications 16/08/9603   Spider veins of both lower extremities 05/24/2014    Past Medical History:  Diagnosis Date   Anti-cardiolipin antibody positive    Anti-cardiolipin antibody syndrome (HCC)    Anxiety    Arthralgia    Arthritis    bil knees, hands   Atypical chest pain    Autoimmune disease ([place])    Breast cancer metastasized to liver ([place]) 09/2020   Cancer ([place]) 07/2020   right breast IDC   Chronic fatigue    Depression    DVT (deep venous thrombosis) (HCC)    IBS (irritable bowel syndrome)    with constipation   Lupus (HCC)    Pulmonary embolus (HCC)    Raynaud's syndrome    Sicca (HCC)    Thrombocytopenia (HCC)     Family History  Problem Relation Age of Onset   Rheum arthritis Father    Hemachromatosis Father    Heart Problems Father    Cancer Sister        bone    Colon cancer Neg Hx    Stomach cancer Neg Hx    Esophageal cancer Neg Hx    Pancreatic cancer Neg Hx    Liver disease Neg Hx    Past Surgical History:  Procedure Laterality Date   ABLATION  12/2018   leg veins   ABLATION Right 08/2019   venous leg    APPENDECTOMY     COLON SURGERY     Perforated colon repair after colostomy   EYE SURGERY Bilateral 2022   GANGLION CYST EXCISION     HYSTEROSCOPY WITH D & C N/A 01/13/2014   Procedure: DILATATION AND CURETTAGE /HYSTEROSCOPY with resection ;  Surgeon: Olga Millers, MD;  Location: [facility];  Service: Gynecology;  Laterality: N/A;   LAPAROTOMY     PORTACATH PLACEMENT N/A 09/13/2020   Procedure: INSERTION PORT-A-CATH WITH ULTRASOUND GUIDANCE;  Surgeon: Rolm Bookbinder, MD;  Location: [facility];  Service: General;  Laterality: N/A;   Social History   Social History Narrative   Not on file   Immunization History  Administered Date(s) Administered   PFIZER Comirnaty(Gray Top)Covid-19 Tri-Sucrose Vaccine 08/11/2020     Objective: Vital Signs: BP 126/76 (BP Location: Left Arm,  Patient Position: Sitting, Cuff Size: Small)   Pulse 72   Resp 12   Ht 5' 4" (1.626 m)   Wt 115 lb (52.2 kg) Comment: Patient reported. Patient refused to weigh.  BMI 19.74 kg/m    Physical Exam Vitals and nursing note reviewed.  Constitutional:      Appearance: She is well-developed.  HENT:     Head: Normocephalic and atraumatic.  Eyes:     Conjunctiva/sclera: Conjunctivae normal.  Pulmonary:     Effort: Pulmonary effort is normal.  Abdominal:     Palpations: Abdomen is soft.  Musculoskeletal:     Cervical back: Normal range of motion.  Skin:    General: Skin is warm and dry.     Capillary Refill: Capillary refill takes less than 2 seconds.  Neurological:     Mental Status: She is alert and oriented to person, place, and time.  Psychiatric:        Behavior: Behavior normal.     Musculoskeletal Exam: C-spine  has good range of motion with some discomfort with lateral rotation especially to the left.  She has painful and limited range of motion of the left shoulder joint.  Some tenderness of the left shoulder joint noted.  Right shoulder has good range of motion with no discomfort.  Elbow joints and wrist joints have good range of motion.  Tenderness over both wrist joints noted.  She has tenderness and synovitis of bilateral second and third MCP joints.  Difficulty making a complete fist due to severity of pain and stiffness in both hands.  Hip joints have good range of motion with no discomfort.  Small effusion in the left knee noted.  Painful range of motion of the right knee joint with mild warmth.  Ankle joints have good range of motion with no tenderness.  Pedal edema noted bilaterally.  CDAI Exam: CDAI Score: -- Patient Global: --; Provider Global: -- Swollen: 5 ; Tender: 10  Joint Exam 07/31/2021      Right  Left  Glenohumeral      Tender  MCP 1   Tender     MCP 2  Swollen Tender  Swollen Tender  MCP 3  Swollen Tender  Swollen Tender  MCP 4      Tender  MCP 5       Tender  Knee   Tender  Swollen Tender     Investigation: No additional findings.  Imaging: MR LIVER W WO CONTRAST  Result Date: 07/31/2021 CLINICAL DATA:  Breast cancer, assess treatment response in 54 year old female with history of prior liver metastases. EXAM: MRI ABDOMEN WITHOUT AND WITH CONTRAST TECHNIQUE: Multiplanar multisequence MR imaging of the abdomen was performed both before and after the administration of intravenous contrast. CONTRAST:  59m MULTIHANCE GADOBENATE DIMEGLUMINE 529 MG/ML IV SOLN COMPARISON:  March of 2022. Also previous exam from December of 2021. FINDINGS: Lower chest: Lung bases are clear. No effusion. No consolidative changes. Hepatobiliary: Subtle diffusion related abnormalities throughout the liver display no change compared to previous imaging largest in the caudate. Lesion in the caudate lobe is the only lesion clearly visible on post-contrast imaging (image 24/19) 12 mm this is stable when compared to the prior study. Smaller lesions are not clearly visible on post-contrast imaging. Portal vein is patent. No pericholecystic stranding. No biliary duct dilation. Pancreas: Normal intrinsic T1 signal. No ductal dilation or sign of inflammation. No focal lesion. Spleen:  Unremarkable. Adrenals/Urinary Tract: Adrenal glands are normal. No suspicious renal mass or hydronephrosis. Stomach/Bowel: No acute gastrointestinal process to the extent evaluated on abdominal MRI. Vascular/Lymphatic: No pathologically enlarged lymph nodes identified. No abdominal aortic aneurysm demonstrated. Other:  No ascites Musculoskeletal: No suspicious bone lesions identified. IMPRESSION: Stable disease. Subtle diffusion related abnormalities in the liver at the site of previous hepatic metastatic disease displaying no change since the prior study, dominant lesion in the caudate is the only lesion clearly visible on post-contrast imaging and is also unchanged. No new lesions. Electronically Signed    By: GZetta BillsM.D.   On: 07/31/2021 16:02   DG Humerus Left  Result Date: 07/13/2021 CLINICAL DATA:  Bilateral arm pain. Humeral pain for 2 weeks. History of breast cancer. EXAM: LEFT HUMERUS - 2+ VIEW COMPARISON:  None. FINDINGS: The cortical margins of the humerus are intact. There is no evidence of fracture or other focal bone lesions. Shoulder and elbow alignment are maintained. Soft tissues are unremarkable. IMPRESSION: Negative radiographs of the left humerus. Electronically Signed   By: MKeith Rake  M.D.   On: 07/13/2021 14:03   DG Humerus Right  Result Date: 07/13/2021 CLINICAL DATA:  Bilateral upper arm/humeral pain. Pain for 2 weeks. No known injury. History of breast cancer. EXAM: RIGHT HUMERUS - 2+ VIEW COMPARISON:  None. FINDINGS: The cortical margins of the humerus are intact. There is no evidence of fracture or other focal bone lesions. Shoulder and elbow alignment are maintained. Soft tissues are unremarkable. IMPRESSION: Negative radiographs of the right humerus. Electronically Signed   By: Keith Rake M.D.   On: 07/13/2021 14:02    Recent Labs: Lab Results  Component Value Date   WBC 3.4 (L) 07/11/2021   HGB 13.6 07/11/2021   PLT 240 07/11/2021   NA 139 07/11/2021   K 4.5 07/11/2021   CL 103 07/11/2021   CO2 28 07/11/2021   GLUCOSE 94 07/11/2021   BUN 20 07/11/2021   CREATININE 1.08 (H) 07/11/2021   BILITOT 0.4 07/11/2021   ALKPHOS 83 07/11/2021   AST 24 07/11/2021   ALT 20 07/11/2021   PROT 6.9 07/11/2021   ALBUMIN 4.1 07/11/2021   CALCIUM 9.4 07/11/2021   GFRAA 95 07/28/2020    Speciality Comments: PLQ Eye Exam: 01/19/2021 WNL @ [facility] follow up in 1 year. stage IB invasive ductal carcinoma, grade 3 08/16/20  Procedures:  No procedures performed Allergies: Beef-derived products, Latex, and Sulfa antibiotics    Assessment / Plan:     Visit Diagnoses: Other organ or system involvement in systemic lupus erythematosus ([place]) -  Positive ANA and positive double-stranded DNA, hypocomplementemia, inflammatory arthritis, raynauds phenomenon: The patient presents today with joint tenderness and synovitis in multiple joints as described above.  She has tenderness and synovitis of several MCP joints and a small effusion in the left knee.  She has been experiencing increased pain in her left shoulder over the past several weeks which has been radiating to her neck.  A MRI of the C-spine was ordered by Wilber Bihari, NP and is scheduled for 08/09/21.   She remains on Plaquenil 200 mg 1 tablet by mouth daily.  She continues to tolerate Plaquenil without any side effects and has not missed any doses recently.  Her symptoms of inflammatory arthritis continue to progress and involve multiple joints.  Discussed that her signs and symptoms of inflammatory arthritis are likely a side effect of immunotherapy.  Her SLE and discoid lupus have been well controlled on Plaquenil.  I reviewed her most recent autoimmune lab work  from 06/01/21 today in the office: dsDNA 1, ESR 9, Vitamin D 79.9, complements WNL, anticardiolipin IgM 21, IgA and IgG are negative.  Her lab work is not consistent with active SLE.  I discussed the patient's case with Dr. Colon Flattery today in the office.  We both do not believe that the inflammatory arthritis is due to uncontrolled SLE. We would like to pursue more aggressive therapy for management of her inflammatory arthritis if cleared by her oncologist Dr. Jana Hakim.  I will reach out to Dr. Jana Hakim for further recommendations prior to initiating combination therapy with Plaquenil.  Chronic inflammatory arthritis: RF negative: She presents today with pain and synovitis in multiple joints as described above.  She is having significant discomfort in the left shoulder, both hands, and both knee joints.  She had tenderness and synovitis of several MCP joints and had a small effusion in the left knee.  She is currently taking  Plaquenil 200 mg 1 tablet by mouth daily and has not missed any doses  recently. Discussed with the patient that her systemic lupus and discoid lupus seem well controlled on current therapy.  Discussed that her inflammatory arthritis could be secondary to immunotherapy.  I discussed the patient case with Dr. Estanislado Pandy today in the office and she is in agreement that her symptoms are likely due to a side effect of immunotherapy.  Discussed that she may benefit from more aggressive treatment with methotrexate.  The concern of starting methotrexate is due to her history of pancytopenia: 02/14/2021: White blood cell count 2.4, hemoglobin 11.7, platelet count 120.  Reviewed her most recent lab work from 07/11/2021 and her white blood cell count was 3.4, hemoglobin 13.6, and platelet count 240.  Dr. Estanislado Pandy recommended reaching out to Dr. Jana Hakim for his input regarding initiating methotrexate. The indications, contraindications, potential side effects of methotrexate were discussed today in detail.  Consent was obtained in anticipation to start on therapy.  She would like to further review the information provided about methotrexate prior to starting therapy.  I also discussed that she can call the office to discuss further with Knox Saliva, [place] if she has additional questions or concerns. She is aware that she will also require baseline immunosuppressive lab work prior to initiating therapy.  Future orders for these labs will be placed for [facility] as requested by the patient.  We also discussed that if methotrexate is not an appropriate treatment option due to her history of pancytopenia a TNF inhibitor such as Enbrel could be an option.  Again I will reach out to Dr. Jana Hakim to get his recommendation for methotrexate versus Enbrel prior to moving forward.  The consent for Enbrel was obtained today but will not be initiated until we have clearance from Dr. Jana Hakim and have updated lab work.  The patient is in  agreement with the plan.  All questions were addressed.  In the meantime she was advised to continue to take Plaquenil as prescribed.  Drug Counseling TB Gold: Future order will be placed for [facility]. Hepatitis panel: Future order will be placed.  Chest-xray:  CT of chest 09/14/20  Alcohol use: Discussed the importance of avoiding alcohol use.   Patient was counseled on the purpose, proper use, and adverse effects of methotrexate including nausea, infection, and signs and symptoms of pneumonitis.  Reviewed instructions with patient to take methotrexate weekly along with folic acid daily.  Discussed the importance of frequent monitoring of kidney and liver function and blood counts, and provided patient with standing lab instructions.  Counseled patient to avoid NSAIDs and alcohol while on methotrexate.  Provided patient with educational materials on methotrexate and answered all questions.  Advised patient to get annual influenza vaccine and to get a pneumococcal vaccine if patient has not already had one.  Patient voiced understanding.  Patient consented to methotrexate use.  Will upload into chart.    Discoid lupus: No discoid lesions at this time.  Discussed the importance of avoiding direct sun exposure and wearing sunscreen SPF greater than 50 on a daily basis.  She will remain on Plaquenil as prescribed.  High risk medication use - Plaquenil 200 mg 1 tablet by mouth daily.  PLQ Eye Exam: 01/19/2021.  CBC and CMP were updated on 07/11/2021 and results were reviewed with the patient today in the office: WBC count was 3.4, creatinine was 1.08, GFR greater than 60, and LFTs within normal limits. Treatment options were discussed today in detail as discussed above.  Consent for methotrexate and Enbrel were discussed in anticipation  to start on therapy in the future.  She will require baseline immunosuppressive lab work prior to initiating therapy.  Future orders for baseline labs will be placed for her  to have drawn at [facility] as requested. Discussed the importance of holding either methotrexate or Enbrel in the future if she develops signs or symptoms of an infection and to resume once infection is completely cleared. I discussed that she will require clearance by Dr. Jana Hakim prior to [place] initiating therapy.  Anticardiolipin antibody positive - She remains on warfarin as prescribed.   Malignant neoplasm of lower-outer quadrant of right breast of female, estrogen receptor positive ([place]) - She was diagnosed with triple positive stage IV breast cancer and has been following along closely with Dr. Jana Hakim.  She completed chemotherapy and will continue on immunotherapy indefinitely.  She may require adjunct radiation and surgery in the future.  Raynaud's disease without gangrene: She continues to experience intermittent symptoms of raynaud's.  No digital ulcerations or signs of gangrene were noted.    Primary osteoarthritis of both hands: She has PIP and DIP prominence consistent with OA of both hands.  She has been having difficulty making a complete fist due to the severity of pain and stiffness in both hands.  She has tenderness and synovitis of several MCP joints as described above.  Primary osteoarthritis of both knees - S/p visco in the past.  She continues to have chronic pain in both knee joints.  Small effusion of the left knee joint noted on examination today.  Her pain is most severe in the right knee joint but no effusion was noted.   Primary insomnia: She has been experiencing increased difficulty sleeping at night due to nocturnal pain in her neck and left shoulder.  Other osteoporosis without current pathological fracture: DEXA updated on 05/10/2021: AP spine BMD 0.761 with T score -2.6.  She has an appointment on 08/02/21 with oncology to discuss DEXA results and treatment options.   Vitamin D deficiency: She takes vitamin D 3000 units daily.  Acute left shoulder pain: The patient  presents today with ongoing pain in the left shoulder which started at the beginning of September 2022.  She was evaluated by her oncology nurse practitioner on 07/18/2021 and the office visit note was reviewed while the patient was in the office today.  Due to her history of metastatic breast cancer they would like to rule out a spinal lesion on her neck that could be contributing to her symptoms.  She is scheduled for an MRI of the C-spine on 08/09/2021. On examination today she has painful range of motion of the left shoulder joint as well as slightly limited lateral rotation to the left.  She is not experiencing any numbness and tingling in her left arm.  She has not noticed any increased muscle weakness in her upper extremities. Discussed that her left shoulder pain could be due to underlying inflammatory arthritis.  She had x-rays of both humerus on 07/11/2021 which were unremarkable. Discussed that we will wait for the MRI results to make any further recommendations at this time.  If her symptoms persist or worsen she would likely benefit from a cortisone injection in the left shoulder.  Neck pain: Scheduled for an MRI of the C-spine on 08/09/2021 to rule out spinal lesion secondary to metastatic breast cancer..  Other medical conditions are listed as follows:  History of DVT (deep vein thrombosis) - She is on warfarin.    History of IBS  History  of pulmonary embolism: She remains on warfarin  History of anxiety  Family history of hemochromatosis  Orders: No orders of the defined types were placed in this encounter.  No orders of the defined types were placed in this encounter.     Follow-Up Instructions: Return in about 2 months (around 09/30/2021) for SLE.   Ofilia Neas, PA-C  Note - This record has been created using Dragon software.  Chart creation errors have been sought, but may not always  have been located. Such creation errors do not reflect on  the standard of medical  care.

## 2021-07-27 ENCOUNTER — Other Ambulatory Visit (HOSPITAL_COMMUNITY): Payer: Self-pay

## 2021-07-31 ENCOUNTER — Encounter: Payer: Self-pay | Admitting: Physician Assistant

## 2021-07-31 ENCOUNTER — Encounter: Payer: Self-pay | Admitting: Oncology

## 2021-07-31 ENCOUNTER — Ambulatory Visit
Admission: RE | Admit: 2021-07-31 | Discharge: 2021-07-31 | Disposition: A | Discharge status: Home / Self Care | Payer: 59 | Source: Ambulatory Visit | Attending: Oncology | Admitting: Oncology

## 2021-07-31 ENCOUNTER — Other Ambulatory Visit: Payer: Self-pay

## 2021-07-31 ENCOUNTER — Ambulatory Visit (INDEPENDENT_AMBULATORY_CARE_PROVIDER_SITE_OTHER): Payer: 59 | Admitting: Physician Assistant

## 2021-07-31 VITALS — BP 126/76 | HR 72 | Resp 12 | Ht 64.0 in | Wt 115.0 lb

## 2021-07-31 DIAGNOSIS — C50511 Malignant neoplasm of lower-outer quadrant of right female breast: Secondary | ICD-10-CM

## 2021-07-31 DIAGNOSIS — L930 Discoid lupus erythematosus: Secondary | ICD-10-CM | POA: Diagnosis not present

## 2021-07-31 DIAGNOSIS — C787 Secondary malignant neoplasm of liver and intrahepatic bile duct: Secondary | ICD-10-CM

## 2021-07-31 DIAGNOSIS — M818 Other osteoporosis without current pathological fracture: Secondary | ICD-10-CM

## 2021-07-31 DIAGNOSIS — I7300 Raynaud's syndrome without gangrene: Secondary | ICD-10-CM

## 2021-07-31 DIAGNOSIS — Z170 Estrogen receptor positive status [ER+]: Secondary | ICD-10-CM

## 2021-07-31 DIAGNOSIS — M542 Cervicalgia: Secondary | ICD-10-CM

## 2021-07-31 DIAGNOSIS — M1990 Unspecified osteoarthritis, unspecified site: Secondary | ICD-10-CM

## 2021-07-31 DIAGNOSIS — M170 Bilateral primary osteoarthritis of knee: Secondary | ICD-10-CM

## 2021-07-31 DIAGNOSIS — Z8719 Personal history of other diseases of the digestive system: Secondary | ICD-10-CM

## 2021-07-31 DIAGNOSIS — M3219 Other organ or system involvement in systemic lupus erythematosus: Secondary | ICD-10-CM | POA: Diagnosis not present

## 2021-07-31 DIAGNOSIS — Z86718 Personal history of other venous thrombosis and embolism: Secondary | ICD-10-CM

## 2021-07-31 DIAGNOSIS — Z86711 Personal history of pulmonary embolism: Secondary | ICD-10-CM

## 2021-07-31 DIAGNOSIS — Z7189 Other specified counseling: Secondary | ICD-10-CM

## 2021-07-31 DIAGNOSIS — Z8349 Family history of other endocrine, nutritional and metabolic diseases: Secondary | ICD-10-CM

## 2021-07-31 DIAGNOSIS — F5101 Primary insomnia: Secondary | ICD-10-CM

## 2021-07-31 DIAGNOSIS — M1380 Other specified arthritis, unspecified site: Secondary | ICD-10-CM

## 2021-07-31 DIAGNOSIS — M19042 Primary osteoarthritis, left hand: Secondary | ICD-10-CM

## 2021-07-31 DIAGNOSIS — Z8659 Personal history of other mental and behavioral disorders: Secondary | ICD-10-CM

## 2021-07-31 DIAGNOSIS — R760 Raised antibody titer: Secondary | ICD-10-CM

## 2021-07-31 DIAGNOSIS — E559 Vitamin D deficiency, unspecified: Secondary | ICD-10-CM

## 2021-07-31 DIAGNOSIS — Z79899 Other long term (current) drug therapy: Secondary | ICD-10-CM

## 2021-07-31 DIAGNOSIS — M19041 Primary osteoarthritis, right hand: Secondary | ICD-10-CM

## 2021-07-31 DIAGNOSIS — M25512 Pain in left shoulder: Secondary | ICD-10-CM

## 2021-07-31 MED ORDER — GADOBENATE DIMEGLUMINE 529 MG/ML IV SOLN
10.0000 mL | Freq: Once | INTRAVENOUS | Status: AC | PRN
  Administered 2021-07-31: 10 mL via INTRAVENOUS

## 2021-07-31 NOTE — Patient Instructions (Addendum)
Standing Labs We placed an order today for your standing lab work.   Please have your standing labs drawn in 2 weeks x2, 2 months, then every 3 months   If possible, please have your labs drawn 2 weeks prior to your appointment so that the provider can discuss your results at your appointment.  Please note that you may see your imaging and lab results in [place] before we have reviewed them. We may be awaiting multiple results to interpret others before contacting you. Please allow our office up to 72 hours to thoroughly review all of the results before contacting the office for clarification of your results.  We have open lab daily: Monday through Thursday from 1:30-4:30 PM and Friday from 1:30-4:00 PM at the office of Dr. Bo Merino, [facility].   Please be advised, all patients with office appointments requiring lab work will take precedent over walk-in lab work.  If possible, please come for your lab work on Monday and Friday afternoons, as you may experience shorter wait times. The office is located at [address] [place], [place], [place] [address] No appointment is necessary.   Labs are drawn by [facility]. Please bring your co-pay at the time of your lab draw.  You may receive a bill from [facility] for your lab work.  If you wish to have your labs drawn at another location, please call the office 24 hours in advance to send orders.  If you have any questions regarding directions or hours of operation,  please call [phone].   As a reminder, please drink plenty of water prior to coming for your lab work. Thanks!   Methotrexate Tablets What is this medication? METHOTREXATE (METH oh TREX ate) treats inflammatory conditions such as arthritis and psoriasis. It works by decreasing inflammation, which can reduce pain and prevent long-term injury to the joints and skin. It may also be used to treat some types of cancer. It works by slowing down the growth of cancer  cells. This medicine may be used for other purposes; ask your health care provider or pharmacist if you have questions. COMMON BRAND NAME(S): Rheumatrex, Trexall What should I tell my care team before I take this medication? They need to know if you have any of these conditions: Fluid in the stomach area or lungs If you often drink alcohol Infection or immune system problems Kidney disease or on hemodialysis Liver disease Low blood counts, like low white cell, platelet, or red cell counts Lung disease Radiation therapy Stomach ulcers Ulcerative colitis An unusual or allergic reaction to methotrexate, other medications, foods, dyes, or preservatives Pregnant or trying to get pregnant Breast-feeding How should I use this medication? Take this medication by mouth with a glass of water. Follow the directions on the prescription label. Take your medication at regular intervals. Do not take it more often than directed. Do not stop taking except on your care team's advice. Make sure you know why you are taking this medication and how often you should take it. If this medication is used for a condition that is not cancer, like arthritis or psoriasis, it should be taken weekly, NOT daily. Taking this medication more often than directed can cause serious side effects, even death. Talk to your care team about safe handling and disposal of this medication. You may need to take special precautions. Talk to your care team about the use of this medication in children. While this medication may be prescribed for selected conditions, precautions do  apply. Overdosage: If you think you have taken too much of this medicine contact a poison control center or emergency room at once. NOTE: This medicine is only for you. Do not share this medicine with others. What if I miss a dose? If you miss a dose, talk with your care team. Do not take double or extra doses. What may interact with this medication? Do not  take this medication with any of the following: Acitretin This medication may also interact with the following: Aspirin and aspirin-like medications including salicylates Azathioprine Certain antibiotics like penicillins, tetracycline, and chloramphenicol Certain medications that treat or prevent blood clots like warfarin, apixaban, dabigatran, and rivaroxaban Certain medications for stomach problems like esomeprazole, omeprazole, pantoprazole Cyclosporine Dapsone Diuretics Gold Hydroxychloroquine Live virus vaccines Medications for infection like acyclovir, adefovir, amphotericin B, bacitracin, cidofovir, foscarnet, ganciclovir, gentamicin, pentamidine, vancomycin Mercaptopurine NSAIDs, medications for pain and inflammation, like ibuprofen or naproxen Other cytotoxic agents Pamidronate Pemetrexed Penicillamine Phenylbutazone Phenytoin Probenecid Pyrimethamine Retinoids such as isotretinoin and tretinoin Steroid medications like prednisone or cortisone Sulfonamides like sulfasalazine and trimethoprim/sulfamethoxazole Theophylline Zoledronic acid This list may not describe all possible interactions. Give your health care provider a list of all the medicines, herbs, non-prescription drugs, or dietary supplements you use. Also tell them if you smoke, drink alcohol, or use illegal drugs. Some items may interact with your medicine. What should I watch for while using this medication? Avoid alcoholic drinks. This medication can make you more sensitive to the sun. Keep out of the sun. If you cannot avoid being in the sun, wear protective clothing and use sunscreen. Do not use sun lamps or tanning beds/booths. You may need blood work done while you are taking this medication. Call your care team for advice if you get a fever, chills or sore throat, or other symptoms of a cold or flu. Do not treat yourself. This medication decreases your body's ability to fight infections. Try to avoid  being around people who are sick. This medication may increase your risk to bruise or bleed. Call your care team if you notice any unusual bleeding. Be careful brushing or flossing your teeth or using a toothpick because you may get an infection or bleed more easily. If you have any dental work done, tell your dentist you are receiving this medication. Check with your care team if you get an attack of severe diarrhea, nausea and vomiting, or if you sweat a lot. The loss of too much body fluid can make it dangerous for you to take this medication. Talk to your care team about your risk of cancer. You may be more at risk for certain types of cancers if you take this medication. Do not become pregnant while taking this medication or for 6 months after stopping it. Women should inform their care team if they wish to become pregnant or think they might be pregnant. Men should not father a child while taking this medication and for 3 months after stopping it. There is potential for serious harm to an unborn child. Talk to your care team for more information. Do not breast-feed an infant while taking this medication or for 1 week after stopping it. This medication may make it more difficult to get pregnant or father a child. Talk to your care team if you are concerned about your fertility. What side effects may I notice from receiving this medication? Side effects that you should report to your care team as soon as possible: Allergic reactions-skin rash, itching, hives,  swelling of the face, lips, tongue, or throat Blood clot-pain, swelling, or warmth in the leg, shortness of breath, chest pain Dry cough, shortness of breath or trouble breathing Infection-fever, chills, cough, sore throat, wounds that don't heal, pain or trouble when passing urine, general feeling of discomfort or being unwell Kidney injury-decrease in the amount of urine, swelling of the ankles, hands, or feet Liver injury-right upper belly  pain, loss of appetite, nausea, light-colored stool, dark yellow or brown urine, yellowing of the skin or eyes, unusual weakness or fatigue Low red blood cell count-unusual weakness or fatigue, dizziness, headache, trouble breathing Redness, blistering, peeling, or loosening of the skin, including inside the mouth Seizures Unusual bruising or bleeding Side effects that usually do not require medical attention (report to your care team if they continue or are bothersome): Diarrhea Dizziness Hair loss Nausea Pain, redness, or swelling with sores inside the mouth or throat Vomiting This list may not describe all possible side effects. Call your doctor for medical advice about side effects. You may report side effects to FDA at 1-800-FDA-1088. Where should I keep my medication? Keep out of the reach of children and pets. Store at room temperature between 20 and 25 degrees C (68 and 77 degrees F). Protect from light. Get rid of any unused medication after the expiration date. Talk to your care team about how to dispose of unused medication. Special directions may apply. NOTE: This sheet is a summary. It may not cover all possible information. If you have questions about this medicine, talk to your doctor, pharmacist, or health care provider.  2022 Elsevier/Gold Standard (2020-12-04 15:55:45)   Etanercept Injection What is this medication? ETANERCEPT (et a [employer]) is used for the treatment of rheumatoid arthritis. The medicine is also used to treat psoriatic arthritis, ankylosing spondylitis, and psoriasis. This medicine may be used for other purposes; ask your health care provider or pharmacist if you have questions. COMMON BRAND NAME(S): Enbrel What should I tell my care team before I take this medication? They need to know if you have any of these conditions: bleeding disorder cancer diabetes granulomatosis with polyangiitis heart failure HIV or AIDs immune system  problems infection such as tuberculosis (TB) or other bacterial, fungal or viral infections liver disease nervous system problems such as Guillain-Barre syndrome, multiple sclerosis or seizures recent or upcoming vaccine an unusual or allergic reaction to etanercept, other medicines, latex, rubber, food, dyes, or preservatives pregnant or trying to get pregnant breast-feeding How should I use this medication? The medicine is injected under the skin. You will be taught how to prepare and give it. Take it as directed on the prescription label. Keep taking it unless your health care provider tells you stop. This medicine comes with INSTRUCTIONS FOR USE. Ask your pharmacist for directions on how to use this medicine. Read the information carefully. Talk to your pharmacist or health care provider if you have questions. If you use a pen, be sure to take off the outer needle cover before using the dose. It is important that you put your used needles and syringes in a special sharps container. Do not put them in a trash can. If you do not have a sharps container, call your pharmacist or health care provider to get one. A special MedGuide will be given to you by the pharmacist with each prescription and refill. Be sure to read this information carefully each time. Talk to your health care provider about the use of this medicine in  children. While it may be prescribed for children as young as 35 years of age for selected conditions, precautions do apply. Overdosage: If you think you have taken too much of this medicine contact a poison control center or emergency room at once. NOTE: This medicine is only for you. Do not share this medicine with others. What if I miss a dose? If you miss a dose, take it as soon as you can. If it is almost time for your next dose, take only that dose. Do not take double or extra doses. What may interact with this medication? Do not take this medicine with any of the following  medications: biologic medicines such as adalimumab, certolizumab, golimumab, infliximab live vaccines rilonacept This medicine may also interact with the following medications: abatacept anakinra biologic medicines such as anifrolumab, baricitinib, belimumab, canakinumab, natalizumab, rituximab, sarilumab, tocilizumab, tofacitinib, upadacitinib, vedolizumab cyclophosphamide sulfasalazine This list may not describe all possible interactions. Give your health care provider a list of all the medicines, herbs, non-prescription drugs, or dietary supplements you use. Also tell them if you smoke, drink alcohol, or use illegal drugs. Some items may interact with your medicine. What should I watch for while using this medication? Visit your health care provider for regular checks on your progress. Tell your health care provider if your symptoms do not start to get better or if they get worse. This medicine may increase your risk of getting an infection. Call your health care provider for advice if you get a fever, chills, sore throat, or other symptoms of a cold or flu. Do not treat yourself. Try to avoid being around people who are sick. If you have not had the measles or chickenpox vaccines, tell your health care provider right away if you are around someone with these viruses. You will be tested for tuberculosis (TB) before you start this medicine. If your doctor prescribes any medicine for TB, you should start taking the TB medicine before starting this medicine. Make sure to finish the full course of TB medicine. Avoid taking medicines that contain aspirin, acetaminophen, ibuprofen, naproxen, or ketoprofen unless instructed by your health care provider. These medicines may hide fever. Talk to your health care provider about your risk of cancer. You may be more at risk for certain types of cancer if you take this medicine. This medicine can decrease the response to a vaccine. If you need to get  vaccinated, tell your health care provider if you have received this medicine. Extra booster doses may be needed. Talk to your health care provider to see if a different vaccination schedule is needed. What side effects may I notice from receiving this medication? Side effects that you should report to your health care provider as soon as possible: allergic reactions (skin rash, itching or hives; swelling of the face, lips, or tongue) changes in vision dizziness heart failure (trouble breathing; fast, irregular heartbeat; sudden weight gain; swelling of the ankles, feet, hands; unusually weak or tired) infection (fever, chills, cough, sore throat, pain or trouble passing urine) light-colored stools liver injury (dark yellow or brown urine; general ill feeling or flu-like symptoms; loss of appetite, right upper belly pain; unusually weak or tired, yellowing of the eyes or skin) low red blood cell counts (trouble breathing; feeling faint; lightheaded, falls; unusually weak or tired) pain, tingling, numbness in the hands or feet red, scaly patches or raised bumps on the skin seizures unusual bruising or bleeding weakness in the arms or legs Side effects that usually  do not require medical attention (report to your health care provider if they continue or are bothersome): headache nausea pain, redness, or irritation at site where injected vomiting This list may not describe all possible side effects. Call your doctor for medical advice about side effects. You may report side effects to FDA at 1-800-FDA-1088. Where should I keep my medication? Keep out of the reach of children and pets. See product for storage information. Each product may have different instructions. Get rid of any unused medicine after the expiration date. To get rid of medicines that are no longer needed or have expired: Take the medicine to a medicine take-back program. Check with your pharmacy or law enforcement to find a  location. If you cannot return the medicine, ask your pharmacist or health care provider how to get rid of this medicine safely. NOTE: This sheet is a summary. It may not cover all possible information. If you have questions about this medicine, talk to your doctor, pharmacist, or health care provider.  2022 Elsevier/Gold Standard (2020-08-11 12:59:29)

## 2021-08-01 ENCOUNTER — Telehealth: Payer: Self-pay

## 2021-08-01 ENCOUNTER — Other Ambulatory Visit: Payer: Self-pay

## 2021-08-01 DIAGNOSIS — M3219 Other organ or system involvement in systemic lupus erythematosus: Secondary | ICD-10-CM

## 2021-08-01 DIAGNOSIS — L930 Discoid lupus erythematosus: Secondary | ICD-10-CM

## 2021-08-01 DIAGNOSIS — Z79899 Other long term (current) drug therapy: Secondary | ICD-10-CM

## 2021-08-01 NOTE — Telephone Encounter (Signed)
Consent was obtained for enbrel and methotrexate. We are not applying at this time, it is pending approval by oncology. Documents sent to the scan center.

## 2021-08-02 ENCOUNTER — Inpatient Hospital Stay: Payer: 59

## 2021-08-02 ENCOUNTER — Inpatient Hospital Stay (HOSPITAL_BASED_OUTPATIENT_CLINIC_OR_DEPARTMENT_OTHER): Payer: 59 | Admitting: Adult Health

## 2021-08-02 ENCOUNTER — Other Ambulatory Visit: Payer: Self-pay

## 2021-08-02 ENCOUNTER — Encounter: Payer: Self-pay | Admitting: Adult Health

## 2021-08-02 VITALS — BP 114/68 | HR 72 | Resp 18 | Ht 64.0 in | Wt 115.4 lb

## 2021-08-02 DIAGNOSIS — C50511 Malignant neoplasm of lower-outer quadrant of right female breast: Secondary | ICD-10-CM

## 2021-08-02 DIAGNOSIS — C787 Secondary malignant neoplasm of liver and intrahepatic bile duct: Secondary | ICD-10-CM

## 2021-08-02 DIAGNOSIS — Z170 Estrogen receptor positive status [ER+]: Secondary | ICD-10-CM

## 2021-08-02 DIAGNOSIS — Z86718 Personal history of other venous thrombosis and embolism: Secondary | ICD-10-CM

## 2021-08-02 DIAGNOSIS — Z5111 Encounter for antineoplastic chemotherapy: Secondary | ICD-10-CM | POA: Diagnosis not present

## 2021-08-02 LAB — CBC WITH DIFFERENTIAL/PLATELET
Abs Immature Granulocytes: 0.01 10*3/uL (ref 0.00–0.07)
Basophils Absolute: 0.1 10*3/uL (ref 0.0–0.1)
Basophils Relative: 2 %
Eosinophils Absolute: 0 10*3/uL (ref 0.0–0.5)
Eosinophils Relative: 0 %
HCT: 39.1 % (ref 36.0–46.0)
Hemoglobin: 13.5 g/dL (ref 12.0–15.0)
Immature Granulocytes: 0 %
Lymphocytes Relative: 37 %
Lymphs Abs: 1.3 10*3/uL (ref 0.7–4.0)
MCH: 34.4 pg — ABNORMAL HIGH (ref 26.0–34.0)
MCHC: 34.5 g/dL (ref 30.0–36.0)
MCV: 99.5 fL (ref 80.0–100.0)
Monocytes Absolute: 0.3 10*3/uL (ref 0.1–1.0)
Monocytes Relative: 8 %
Neutro Abs: 1.7 10*3/uL (ref 1.7–7.7)
Neutrophils Relative %: 53 %
Platelets: 168 10*3/uL (ref 150–400)
RBC: 3.93 MIL/uL (ref 3.87–5.11)
RDW: 12.2 % (ref 11.5–15.5)
WBC: 3.3 10*3/uL — ABNORMAL LOW (ref 4.0–10.5)
nRBC: 0 % (ref 0.0–0.2)

## 2021-08-02 LAB — COMPREHENSIVE METABOLIC PANEL
ALT: 15 U/L (ref 0–44)
AST: 21 U/L (ref 15–41)
Albumin: 4.1 g/dL (ref 3.5–5.0)
Alkaline Phosphatase: 92 U/L (ref 38–126)
Anion gap: 8 (ref 5–15)
BUN: 12 mg/dL (ref 6–20)
CO2: 28 mmol/L (ref 22–32)
Calcium: 9.3 mg/dL (ref 8.9–10.3)
Chloride: 101 mmol/L (ref 98–111)
Creatinine, Ser: 0.92 mg/dL (ref 0.44–1.00)
GFR, Estimated: >60 mL/min (ref >60)
Glucose, Bld: 93 mg/dL (ref 70–99)
Potassium: 4.1 mmol/L (ref 3.5–5.1)
Sodium: 137 mmol/L (ref 135–145)
Total Bilirubin: 0.4 mg/dL (ref 0.3–1.2)
Total Protein: 6.9 g/dL (ref 6.5–8.1)

## 2021-08-02 LAB — PROTIME-INR
INR: 1.9 — ABNORMAL HIGH (ref 0.8–1.2)
Prothrombin Time: 22.1 seconds — ABNORMAL HIGH (ref 11.4–15.2)

## 2021-08-02 MED ORDER — TRASTUZUMAB-ANNS CHEMO 150 MG IV SOLR
6.0000 mg/kg | Freq: Once | INTRAVENOUS | Status: AC
  Administered 2021-08-02: 315 mg via INTRAVENOUS
  Filled 2021-08-02: qty 15

## 2021-08-02 MED ORDER — DIPHENHYDRAMINE HCL 25 MG PO CAPS
25.0000 mg | ORAL_CAPSULE | Freq: Once | ORAL | Status: AC
  Administered 2021-08-02: 25 mg via ORAL
  Filled 2021-08-02: qty 1

## 2021-08-02 MED ORDER — HEPARIN SOD (PORK) LOCK FLUSH 100 UNIT/ML IV SOLN
500.0000 [IU] | Freq: Once | INTRAVENOUS | Status: AC | PRN
  Administered 2021-08-02: 500 [IU]

## 2021-08-02 MED ORDER — SODIUM CHLORIDE 0.9 % IV SOLN: Freq: Once | INTRAVENOUS | Status: AC

## 2021-08-02 MED ORDER — TRAMADOL HCL 50 MG PO TABS: 50.0000 mg | ORAL_TABLET | Freq: Four times a day (QID) | ORAL | 0 refills | Status: AC | PRN

## 2021-08-02 MED ORDER — ACETAMINOPHEN 325 MG PO TABS
650.0000 mg | ORAL_TABLET | Freq: Once | ORAL | Status: AC
  Administered 2021-08-02: 650 mg via ORAL
  Filled 2021-08-02: qty 2

## 2021-08-02 MED ORDER — SODIUM CHLORIDE 0.9% FLUSH
10.0000 mL | INTRAVENOUS | Status: DC | PRN
  Administered 2021-08-02: 10 mL

## 2021-08-02 NOTE — Patient Instructions (Signed)
[facility]  Discharge Instructions: Thank you for choosing [facility] to provide your oncology and hematology care.   If you have a lab appointment with the Cancer Center, please go directly to the Cancer Center and check in at the registration area.   Wear comfortable clothing and clothing appropriate for easy access to any Portacath or PICC line.   We strive to give you quality time with your provider. You may need to reschedule your appointment if you arrive late (15 or more minutes).  Arriving late affects you and other patients whose appointments are after yours.  Also, if you miss three or more appointments without notifying the office, you may be dismissed from the clinic at the provider's discretion.      For prescription refill requests, have your pharmacy contact our office and allow 72 hours for refills to be completed.    Today you received the following chemotherapy and/or immunotherapy agents trastuzumab      To help prevent nausea and vomiting after your treatment, we encourage you to take your nausea medication as directed.  BELOW ARE SYMPTOMS THAT SHOULD BE REPORTED IMMEDIATELY: *FEVER GREATER THAN 100.4 F (38 C) OR HIGHER *CHILLS OR SWEATING *NAUSEA AND VOMITING THAT IS NOT CONTROLLED WITH YOUR NAUSEA MEDICATION *UNUSUAL SHORTNESS OF BREATH *UNUSUAL BRUISING OR BLEEDING *URINARY PROBLEMS (pain or burning when urinating, or frequent urination) *BOWEL PROBLEMS (unusual diarrhea, constipation, pain near the anus) TENDERNESS IN MOUTH AND THROAT WITH OR WITHOUT PRESENCE OF ULCERS (sore throat, sores in mouth, or a toothache) UNUSUAL RASH, SWELLING OR PAIN  UNUSUAL VAGINAL DISCHARGE OR ITCHING   Items with * indicate a potential emergency and should be followed up as soon as possible or go to the Emergency Department if any problems should occur.  Please show the CHEMOTHERAPY ALERT CARD or IMMUNOTHERAPY ALERT CARD at check-in to  the Emergency Department and triage nurse.  Should you have questions after your visit or need to cancel or reschedule your appointment, please contact [facility]  Dept: [phone]  and follow the prompts.  Office hours are 8:00 a.m. to 4:30 p.m. Monday - Friday. Please note that voicemails left after 4:00 p.m. may not be returned until the following business day.  We are closed weekends and major holidays. You have access to a nurse at all times for urgent questions. Please call the main number to the clinic Dept: [phone] and follow the prompts.   For any non-urgent questions, you may also contact your provider using MyChart. We now offer e-Visits for anyone 18 and older to request care online for non-urgent symptoms. For details visit [email or web address].   Also download the MyChart app! Go to the app store, search "MyChart", open the app, select [facility], and log in with your MyChart username and password.  Due to Covid, a mask is required upon entering the hospital/clinic. If you do not have a mask, one will be given to you upon arrival. For doctor visits, patients may have 1 support person aged 18 or older with them. For treatment visits, patients cannot have anyone with them due to current Covid guidelines and our immunocompromised population.   

## 2021-08-02 NOTE — Progress Notes (Signed)
Patient reports she is menopausal/post menopausal and feels there is no chance of pregnancy. Patient states she understands the risk associated with receiving trastuzumab if pregnant and wishes to proceed without urine pregnancy test.

## 2021-08-02 NOTE — Progress Notes (Signed)
[place]  [phone]) [phone] Fax:(336) [phone]     ID: Erica Wood DOB: [birth year]  MR#: [id number]  GHW#:[id number]  Patient Care Team: Eunice Blase, MD as PCP - General (Family Medicine) Prudence Davidson,  Bing, MD as Referring Physician (Oncology) Rockwell Germany, RN as Oncology Nurse Navigator Mauro Kaufmann, RN as Oncology Nurse Navigator Rolm Bookbinder, MD as Consulting Physician (General Surgery) Magrinat, Virgie Dad, MD as Consulting Physician (Oncology) Kyung Rudd, MD as Consulting Physician (Radiation Oncology) Bo Merino, MD as Consulting Physician (Rheumatology) Olga Millers, MD as Consulting Physician (Obstetrics and Gynecology) Larey Dresser, MD as Consulting Physician (Cardiology) Armbruster, Carlota Raspberry, MD as Consulting Physician (Gastroenterology) Nena Jordan, MD as Referring Physician (Ophthalmology) Nena Jordan, MD as Referring Physician (Ophthalmology) Scot Dock, NP OTHER MD:  CHIEF COMPLAINT: triple positive stage IV breast cancer  CURRENT TREATMENT: herceptin; anastrozole, palbociclib; coumadin   INTERVAL HISTORY: Erica Wood returns today for follow up and treatment of her triple positive breast cancer.  Erica Wood continues on Herceptin, anastrozole, palbociclib and coumadin.  Her coumadin is managed by her PCP Dr. Junius Roads.    Erica Wood is currently in her off week from the [place].  She takes this at 146m po days 1-21, with 7 days off on a 28 day cycle.  She tolerates this well.    She receives Herceptin every three weeks with good tolerance. She has not had an echo since 6/16.  It is ordered but not yet scheduled.  She has no cough, shortness of breath, orthopnea, swelling, DOE.    She also takes Anastrozole daily.  She does not some hot flashes.  She was prescribed Effexor for these, but has not started taking it.    PShelenanotes the pain in her shoulder and upper arms is slightly better but she is  unable to lift her left arm.  She takes tramadol if needed for the pain.  She says this helps with her functionality.  She does get constipated with tramadol, so she takes it sparingly.    She underwent MRI liver on 9/20 that showed no progression of disease.    Lab Results  Component Value Date   CA2729 65.8 (H) 07/11/2021   CA2729 68.9 (H) 06/20/2021   CA2729 69.2 (H) 05/30/2021   CA2729 76.3 (H) 05/09/2021   CA2729 69.4 (H) 04/18/2021    REVIEW OF SYSTEMS: Review of Systems  Constitutional:  Positive for fatigue. Negative for appetite change, chills, fever and unexpected weight change.  HENT:   Negative for hearing loss, lump/mass and trouble swallowing.   Eyes:  Negative for eye problems and icterus.  Respiratory:  Negative for chest tightness, cough and shortness of breath.   Cardiovascular:  Negative for chest pain, leg swelling and palpitations.  Gastrointestinal:  Negative for abdominal distention, abdominal pain, constipation, diarrhea, nausea and vomiting.  Endocrine: Positive for hot flashes.  Genitourinary:  Negative for difficulty urinating.   Musculoskeletal:  Positive for arthralgias. Negative for neck pain.  Skin:  Negative for itching and rash.  Neurological:  Negative for dizziness, extremity weakness, headaches and numbness.  Hematological:  Negative for adenopathy. Does not bruise/bleed easily.  Psychiatric/Behavioral:  Negative for depression. The patient is not nervous/anxious.      COVID 19 VACCINATION STATUS: [place], most recently 08/2020   HISTORY OF CURRENT ILLNESS: From the original intake note:  PRafaelitaherself palpated an abnormality in the right breast several years ago.  This had not changed until sometime in  June 2021.  She was evaluated by Dr. Ouida Sills and underwent right diagnostic mammography with tomography and right breast ultrasonography at [facility] on 07/26/2020 showing: breast density category D; 2.6 cm mass in right breast at 6  o'clock, at site of palpable concern; two focal groups of punctate calcifications posterior to palpable mass; no right axillary adenopathy.  Accordingly on 08/04/2020 she proceeded to biopsy of the right breast area in question. The pathology from this procedure ([id number]) showed: invasive ductal carcinoma, grade 3. Prognostic indicators significant for: estrogen receptor, 95% positive with moderate staining intensity and progesterone receptor, 10% positive with strong staining intensity. Proliferation marker Ki67 at 40%. HER2 equivocal by immunohistochemistry (2+), but positive by fluorescent in situ hybridization with a signals ratio 3.23 and number per cell 6.45.  Biopsy of the posterior calcifications was benign.  She also underwent breast MRI on 08/11/2020 showing: breast composition D; 2.6 cm biopsy-proven malignancy in right breast at 6 o'clock, with probable skin involvement noted on previous ultrasound; additional enhancing masses within right breast-- 5 mm in lower-outer, 1.2 cm in upper-inner, 7 mm in upper-outer; no evidence of left breast malignancy or lymphadenopathy; heterogeneous enhancement of sternum, of uncertain significance.  The patient's subsequent history is as detailed below.   PAST MEDICAL HISTORY: Past Medical History:  Diagnosis Date   Anti-cardiolipin antibody positive    Anti-cardiolipin antibody syndrome (HCC)    Anxiety    Arthralgia    Arthritis    bil knees, hands   Atypical chest pain    Autoimmune disease ([place])    Breast cancer metastasized to liver ([place]) 09/2020   Cancer ([place]) 07/2020   right breast IDC   Chronic fatigue    Depression    DVT (deep venous thrombosis) (HCC)    IBS (irritable bowel syndrome)    with constipation   Lupus ([place])    Pulmonary embolus (HCC)    Raynaud's syndrome    Sicca ([place])    Thrombocytopenia ([place])     PAST SURGICAL HISTORY: Past Surgical History:  Procedure Laterality Date   ABLATION  12/2018   leg veins    ABLATION Right 08/2019   venous leg    APPENDECTOMY     COLON SURGERY     Perforated colon repair after colostomy   EYE SURGERY Bilateral 2022   GANGLION CYST EXCISION     HYSTEROSCOPY WITH D & C N/A 01/13/2014   Procedure: DILATATION AND CURETTAGE /HYSTEROSCOPY with resection ;  Surgeon: Olga Millers, MD;  Location: [facility];  Service: Gynecology;  Laterality: N/A;   LAPAROTOMY     PORTACATH PLACEMENT N/A 09/13/2020   Procedure: INSERTION PORT-A-CATH WITH ULTRASOUND GUIDANCE;  Surgeon: Rolm Bookbinder, MD;  Location: [facility];  Service: General;  Laterality: N/A;    FAMILY HISTORY: Family History  Problem Relation Age of Onset   Rheum arthritis Father    Hemachromatosis Father    Heart Problems Father    Cancer Sister        bone    Colon cancer Neg Hx    Stomach cancer Neg Hx    Esophageal cancer Neg Hx    Pancreatic cancer Neg Hx    Liver disease Neg Hx   as of October 2021 her father isage 71 and her mother age 43.. The patient had one sister (and no brothers). The sister was diagnosed with Ewing's sarcoma at age 31, and died from the disease after many difficult treatments.    GYNECOLOGIC HISTORY:  Last menstrual period occurred about 6 months prior to the start of chemotherapy Menarche: 40 or 55 years old GX P 0 LMP around 2019 Contraceptive: yes HRT no  Hysterectomy? no BSO? 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 She is widowed. She lives at home with her cat, Mariea Clonts. She is not a Ambulance person.    ADVANCED DIRECTIVES: not in place; at the 08/16/2020 visit the patient was given the appropriate documents to complete and notarized at her discretion.     HEALTH MAINTENANCE: Social History   Tobacco Use   Smoking status: Former    Packs/day: 0.50    Years: 5.00    Pack years: 2.50    Types: Cigarettes    Start date: 08/06/2006    Quit date: 08/07/2011    Years since quitting: 9.9    Smokeless tobacco: Never  Vaping Use   Vaping Use: Never used  Substance Use Topics   Alcohol use: Yes    Comment: occasionally   Drug use: No     Colonoscopy: 2002 for perforated bowel  PAP: 03/2020  Bone density: done at Dr. Keane Police office   Allergies  Allergen Reactions   Beef-Derived Products Hives   Latex Rash    Contact   Sulfa Antibiotics Rash    Current Outpatient Medications  Medication Sig Dispense Refill   acetaminophen (TYLENOL) 500 MG tablet Take 1,000 mg by mouth every 6 (six) hours as needed for moderate pain or headache.     amphetamine-dextroamphetamine (ADDERALL XR) 20 MG 24 hr capsule Take 1 capsule (20 mg total) by mouth daily. 30 capsule 0   anastrozole (ARIMIDEX) 1 MG tablet Take 1 tablet (1 mg total) by mouth daily. 90 tablet 4   Ascorbic Acid (VITAMIN C PO) Take 3,000 mg by mouth daily.      Cholecalciferol (VITAMIN D3) 75 MCG (3000 UT) TABS Take 3,000 Units by mouth daily.     Clindamycin-Benzoyl Per, Refr, gel Apply topically.     hydroxychloroquine (PLAQUENIL) 200 MG tablet Take 1 tablet (200 mg total) by mouth daily. 90 tablet 4   lidocaine-prilocaine (EMLA) cream Apply topically.     metoCLOPramide (REGLAN) 10 MG tablet Take 5-10 mg by mouth as needed.     neomycin-polymyxin b-dexamethasone (MAXITROL) 3.5-10000-0.1 SUSP 1 drop 2 (two) times daily.     palbociclib (IBRANCE) 100 MG tablet Take 1 tablet (100 mg total) by mouth daily. Take for 21 days on, 7 days off, repeat every 28 days. 21 tablet 6   RESTASIS MULTIDOSE 0.05 % ophthalmic emulsion Place 1 drop into both eyes 2 (two) times daily. 11 mL 5   spironolactone (ALDACTONE) 25 MG tablet Take 25 mg by mouth daily as needed (swelling).     traMADol (ULTRAM) 50 MG tablet Take 1-2 tablets (50-100 mg total) by mouth every 6 (six) hours as needed. (Patient taking differently: Take 50-100 mg by mouth every 6 (six) hours as needed for moderate pain.) 60 tablet 0   tretinoin (RETIN-A) 0.1 % cream Apply 1  application topically at bedtime as needed (acne).      triamterene-hydrochlorothiazide (DYAZIDE) 37.5-25 MG capsule TAKE 1 CAPSULE BY MOUTH DAILY AS NEEDED FOR SWELLING 90 capsule 0   warfarin (COUMADIN) 5 MG tablet TAKE 1 TABLET(5 MG) BY MOUTH DAILY 30 tablet PRN   No current facility-administered medications for this visit.    OBJECTIVE:   Vitals:   08/02/21 0836  BP: 114/68  Pulse:  72  Resp: 18  SpO2: 100%     Body mass index is 19.81 kg/m.   Wt Readings from Last 3 Encounters:  08/02/21 115 lb 6.4 oz (52.3 kg)  07/31/21 115 lb (52.2 kg)  07/18/21 115 lb 1.6 oz (52.2 kg)      ECOG FS:2 - Symptomatic, <50% confined to bed GENERAL: Patient is a well appearing female in no acute distress HEENT:  Sclerae anicteric.  Mask in place. Neck is supple.  NODES:  No cervical, supraclavicular, or axillary lymphadenopathy palpated.  BREAST EXAM:  Deferred. LUNGS:  Clear to auscultation bilaterally.  No wheezes or rhonchi. HEART:  Regular rate and rhythm. No murmur appreciated. ABDOMEN:  Soft, nontender.  Positive, normoactive bowel sounds. No organomegaly palpated. MSK:  No focal spinal tenderness to palpation.Slight difficulty in left arm ROM EXTREMITIES:  No peripheral edema.   SKIN:  Clear with no obvious rashes or skin changes. No nail dyscrasia. NEURO:  Nonfocal. Well oriented.  Appropriate affect.   LAB RESULTS:  CMP     Component Value Date/Time   NA 139 07/11/2021 0749   NA 137 06/01/2021 1047   K 4.5 07/11/2021 0749   CL 103 07/11/2021 0749   CO2 28 07/11/2021 0749   GLUCOSE 94 07/11/2021 0749   BUN 20 07/11/2021 0749   BUN 16 06/01/2021 1047   CREATININE 1.08 (H) 07/11/2021 0749   CREATININE 0.78 10/20/2020 1330   CALCIUM 9.4 07/11/2021 0749   PROT 6.9 07/11/2021 0749   PROT 6.6 06/01/2021 1047   ALBUMIN 4.1 07/11/2021 0749   ALBUMIN 4.4 06/01/2021 1047   AST 24 07/11/2021 0749   AST 19 09/21/2020 0827   ALT 20 07/11/2021 0749   ALT 27 09/21/2020 0827    ALKPHOS 83 07/11/2021 0749   BILITOT 0.4 07/11/2021 0749   BILITOT 0.3 06/01/2021 1047   BILITOT 0.4 09/21/2020 0827   GFRNONAA >60 07/11/2021 0749   GFRNONAA >60 10/20/2020 1330   GFRAA 95 07/28/2020 1108    No results found for: TOTALPROTELP, ALBUMINELP, A1GS, A2GS, BETS, BETA2SER, GAMS, MSPIKE, SPEI  Lab Results  Component Value Date   WBC 3.3 (L) 08/02/2021   NEUTROABS 1.7 08/02/2021   HGB 13.5 08/02/2021   HCT 39.1 08/02/2021   MCV 99.5 08/02/2021   PLT 168 08/02/2021    No results found for: LABCA2  No components found for: DGUYQI347  No results for input(s): INR in the last 168 hours.   No results found for: LABCA2  No results found for: CAN199  Lab Results  Component Value Date   QQV956 29.9 01/03/2021    No results found for: LOV564  Lab Results  Component Value Date   CA2729 65.8 (H) 07/11/2021    No components found for: HGQUANT  Lab Results  Component Value Date   CEA1 1.88 01/03/2021   /  CEA ([facility])  Date Value Ref Range Status  01/03/2021 1.88 0.00 - 5.00 ng/mL Final    Comment:    (NOTE) This test was performed using Architect's Chemiluminescent Microparticle Immunoassay. Values obtained from different assay methods cannot be used interchangeably. Please note that 5-10% of patients who smoke may see CEA levels up to 6.9 ng/mL. Performed at [facility], [place] [address], [place], [place] [address]      No results found for: AFPTUMOR  No results found for: [place]  No results found for: KPAFRELGTCHN, LAMBDASER, KAPLAMBRATIO (kappa/lambda light chains)  No results found for: HGBA, HGBA2QUANT, HGBFQUANT, HGBSQUAN (  Hemoglobinopathy evaluation)   No results found for: LDH  Lab Results  Component Value Date   IRON 88 10/29/2018   IRON 88 10/29/2018   TIBC 241 (L) 10/29/2018   TIBC 241 (L) 10/29/2018   IRONPCTSAT 37 10/29/2018   IRONPCTSAT 37 10/29/2018   (Iron and TIBC)  Lab Results   Component Value Date   FERRITIN 43 10/29/2018   FERRITIN 43 10/29/2018    Urinalysis    Component Value Date/Time   APPEARANCEUR Clear 06/26/2021 1331   GLUCOSEU Negative 06/26/2021 1331   BILIRUBINUR Negative 06/26/2021 1331   PROTEINUR Negative 06/26/2021 1331   NITRITE Negative 06/26/2021 1331   LEUKOCYTESUR Negative 06/26/2021 1331    STUDIES: MR LIVER W WO CONTRAST  Result Date: 07/31/2021 CLINICAL DATA:  Breast cancer, assess treatment response in 55 year old female with history of prior liver metastases. EXAM: MRI ABDOMEN WITHOUT AND WITH CONTRAST TECHNIQUE: Multiplanar multisequence MR imaging of the abdomen was performed both before and after the administration of intravenous contrast. CONTRAST:  53m MULTIHANCE GADOBENATE DIMEGLUMINE 529 MG/ML IV SOLN COMPARISON:  March of 2022. Also previous exam from December of 2021. FINDINGS: Lower chest: Lung bases are clear. No effusion. No consolidative changes. Hepatobiliary: Subtle diffusion related abnormalities throughout the liver display no change compared to previous imaging largest in the caudate. Lesion in the caudate lobe is the only lesion clearly visible on post-contrast imaging (image 24/19) 12 mm this is stable when compared to the prior study. Smaller lesions are not clearly visible on post-contrast imaging. Portal vein is patent. No pericholecystic stranding. No biliary duct dilation. Pancreas: Normal intrinsic T1 signal. No ductal dilation or sign of inflammation. No focal lesion. Spleen:  Unremarkable. Adrenals/Urinary Tract: Adrenal glands are normal. No suspicious renal mass or hydronephrosis. Stomach/Bowel: No acute gastrointestinal process to the extent evaluated on abdominal MRI. Vascular/Lymphatic: No pathologically enlarged lymph nodes identified. No abdominal aortic aneurysm demonstrated. Other:  No ascites Musculoskeletal: No suspicious bone lesions identified. IMPRESSION: Stable disease. Subtle diffusion related  abnormalities in the liver at the site of previous hepatic metastatic disease displaying no change since the prior study, dominant lesion in the caudate is the only lesion clearly visible on post-contrast imaging and is also unchanged. No new lesions. Electronically Signed   By: GZetta BillsM.D.   On: 07/31/2021 16:02   DG Humerus Left  Result Date: 07/13/2021 CLINICAL DATA:  Bilateral arm pain. Humeral pain for 2 weeks. History of breast cancer. EXAM: LEFT HUMERUS - 2+ VIEW COMPARISON:  None. FINDINGS: The cortical margins of the humerus are intact. There is no evidence of fracture or other focal bone lesions. Shoulder and elbow alignment are maintained. Soft tissues are unremarkable. IMPRESSION: Negative radiographs of the left humerus. Electronically Signed   By: MKeith RakeM.D.   On: 07/13/2021 14:03   DG Humerus Right  Result Date: 07/13/2021 CLINICAL DATA:  Bilateral upper arm/humeral pain. Pain for 2 weeks. No known injury. History of breast cancer. EXAM: RIGHT HUMERUS - 2+ VIEW COMPARISON:  None. FINDINGS: The cortical margins of the humerus are intact. There is no evidence of fracture or other focal bone lesions. Shoulder and elbow alignment are maintained. Soft tissues are unremarkable. IMPRESSION: Negative radiographs of the right humerus. Electronically Signed   By: MKeith RakeM.D.   On: 07/13/2021 14:02     ELIGIBLE FOR AVAILABLE RESEARCH PROTOCOL: AET  ASSESSMENT: 55y.o. Erica Wood status post right breast lower outer quadrant biopsy 08/04/2020 for a clinical T2 N0 M1,  stage IV invasive ductal carcinoma, grade 3, triple positive, with an MIB-1 of 40%  (a) breast MRI 08/14/2020 shows 3 additional right breast lesions and heterogeneous enhancement of the sternum  (b) biopsy of one of the additional right breast masses 09/05/2020 showed atypical lobular hyperplasia (concordant).  (c) CT scan of the abdomen and pelvis 07/14/2020 shows 2 indeterminate liver lesions  (d)  abdominal MRI 08/24/2020 shows multiple liver lesions highly suspicious for liver metastases.  (e) liver biopsy 09/20/2020 positive for carcinoma, prognostic panel estrogen and progesterone receptor positive, but HER-2 negative (0); MIB-1 of 30%  (f) bone scan 09/14/2020 shows no evidence of bone metastases  (g) CT scan of the chest and head 09/14/2020 showed no evidence of metastatic disease in the chest or brain  (h) CA 27-29 was 91.9 on 01/03/2021; CEA and CA-125 normal  (1) neoadjuvant chemotherapy consisting of docetaxel, carboplatin, trastuzumab and pertuzumab every 21 days x 6 started 08/31/2020, completed 12/14/2020  (a) pertuzumab omitted starting with cycle 4  (b) docetaxel omitted with final cycle, gemzar substituted  (2) anti-HER-2 immunotherapy (trastuzumab) Q 21 d to continue indefinitely  (a) echo 04/12/2020 shows an ejection fraction in the 60-65% range  (b) echo 09/25/2020 shows an ejection fraction in the 60 to 65% range  (c) echo 01/10/2021 shows an ejection fraction in the 60-65% range  (d) echo 04/24/2021 shows an ejection fraction in the 60-65% range  (3) definitive surgery to follow depending on optimal response to systemic therapy  (4) adjuvant radiation to follow surgery as appropriate  (5) anastrozole started 01/29/2021  (a) palbociclib/Ibrance started 01/29/2021  (b) palbociclib dose decreased to 100 mg beginning with May 2022 cycle due to cytopenias  (6) staging studies:  (a) post-NEO chemotherapy breast MRI 12/26/2020 showed right breast index lesion decreased to 0.8 cm; 2 additional masses in the right breast stable  (b) abdominal MRI 01/29/2021 shows no residual measurable disease in the liver  (c) breast MRI 05/01/2021 shows continuing response in the breast  (7) history LLE DVT/ PE 2003: on lifelong anticoagulation (warfarin)   PLAN: Erica Wood continues on treatment for her metastatic breast cancer with good tolerance.  Her most recent MRI showed no  progression of her breast cancer.  She continues on treatment with Palbociclib, Anastrozole, Trastuzumab with good tolerance.  We discussed how to manage her hot flashes.  Her CBC remains stable and she has no neutropenia. I asked my nurse to help and get her echo scheduled.  She will proceed with treatment today after reviewing her 6/16 echo and the fact that she has not symptoms or PE findings of decreased cardiac function.    She was told by her rheumatologist that herceptin was perhaps contributing to her joint inflammation and they recommended she receive Enbrel and Methotrexate.  They are waiting on our decisions to move forward.    Erica Wood continues to have pain in her neck.  We will await her MRI which is scheduled on 9/29.  Should it be negative for lesion she will f/u with Dr. Estanislado Pandy and may receive a shoulder injection.  I refilled her tramadol, we discussed how to manage constipation.  PMP aware reviewed.    Erica Wood will return every 3 weeks for labs and Herceptin, and she will see Dr. Jana Hakim in mid-November.  She knows to call for any questions that may arise between now and her next appointment.  We are happy to see her sooner if needed.   Total encounter time 30 minutes.Wilber Bihari,  NP 08/02/21 8:42 AM Medical Oncology and Hematology [facility] [place], [place] [address] Tel. [phone]    Fax. [phone]    *Total Encounter Time as defined by the Centers for Medicare and Medicaid Services includes, in addition to the face-to-face time of a patient visit (documented in the note above) non-face-to-face time: obtaining and reviewing outside history, ordering and reviewing medications, tests or procedures, care coordination (communications with other health care professionals or caregivers) and documentation in the medical record.

## 2021-08-03 LAB — CANCER ANTIGEN 27.29: CA 27.29: 68.1 U/mL — ABNORMAL HIGH (ref 0.0–38.6)

## 2021-08-07 ENCOUNTER — Telehealth: Payer: Self-pay | Admitting: Physician Assistant

## 2021-08-07 NOTE — Telephone Encounter (Signed)
-----   Message from Shona Needles, RT sent at 08/02/2021  8:59 AM EDT -----  ----- Message ----- From: Chauncey Cruel, MD Sent: 08/01/2021   5:17 PM EDT To: Earnestine Mealing, CMA  We can work with either--whichever you feel will best control her rheumatic disease  We will continue to follow Mahathi closely--at this point her cancer is very well controlled and my expectation is it will continue that way for the foreseeable future  Thanks!  GM ----- Message ----- From: Earnestine Mealing, CMA Sent: 08/01/2021   4:27 PM EDT To: Chauncey Cruel, MD  Please review office note. Dr. Estanislado Pandy would like your opinion on starting this patient on methotrexate vs Enbrel.  Thank you!

## 2021-08-07 NOTE — Telephone Encounter (Signed)
Patient to discuss treatment options for inflammatory arthritis.  At her last visit we discussed methotrexate versus Enbrel.  Consent for both medications were obtained at that visit.  Dr. Jana Hakim was okay with [place] proceeding with either medication with close follow-up.  The patient has reviewed information about methotrexate and Enbrel in detail. I discussed the concern for pancytopenia while on methotrexate as well as potential side effects.  I am concerned about the tolerability with methotrexate. I also discussed that the efficacy with Enbrel likely be better.  She asked about increasing the dose of Plaquenil but I discussed that the potential for toxicity will increase at the increased dose since the dosing is high in weight-based.  I also discussed that Plaquenil is a mild medication and would likely not be effective enough at managing the severity of her inflammatory arthritis currently. She is scheduled for an MRI of the C-spine on 08/09/2021.  She would like to hold off on starting any new medications until after the MRI has resulted.  If she continues to have persistent pain in her shoulders she will schedule an appointment for a cortisone injection.  She plans on further reviewing information about Enbrel prior to [place] proceeding with the application process. All questions were addressed.    Erica Sams, PA-C

## 2021-08-09 ENCOUNTER — Other Ambulatory Visit: Payer: Self-pay

## 2021-08-09 ENCOUNTER — Ambulatory Visit
Admission: RE | Admit: 2021-08-09 | Discharge: 2021-08-09 | Disposition: A | Discharge status: Home / Self Care | Payer: 59 | Source: Ambulatory Visit | Attending: Adult Health | Admitting: Adult Health

## 2021-08-09 DIAGNOSIS — L930 Discoid lupus erythematosus: Secondary | ICD-10-CM

## 2021-08-09 DIAGNOSIS — C50511 Malignant neoplasm of lower-outer quadrant of right female breast: Secondary | ICD-10-CM

## 2021-08-09 DIAGNOSIS — C787 Secondary malignant neoplasm of liver and intrahepatic bile duct: Secondary | ICD-10-CM

## 2021-08-09 DIAGNOSIS — Z170 Estrogen receptor positive status [ER+]: Secondary | ICD-10-CM

## 2021-08-09 IMAGING — MR MR CERVICAL SPINE WO/W CM
5 of 8 series · 27 of 48 positions shown · IV contrast (10 ml Multihance)
Comparison: None.

CLINICAL DATA: Liver metastases; cord compression; breast cancer,
staging; bilateral arm pain, evaluate neck for etiology; discoid
lupus; malignant neoplasm of lower-outer quadrant of right breast of
female, estrogen receptor positive.

EXAM:
MRI CERVICAL SPINE WITHOUT AND WITH CONTRAST
TECHNIQUE: Multiplanar and multiecho pulse sequences of the cervical spine, to
include the craniocervical junction and cervicothoracic junction,
were obtained without and with intravenous contrast.
CONTRAST:  10mL MULTIHANCE GADOBENATE DIMEGLUMINE 529 MG/ML IV SOLN

[Series 2: T2 · sagittal · 3.0mm · 0.66mm/px · 5 of 18 slices shown (1 of 2)]
[im 1/18]
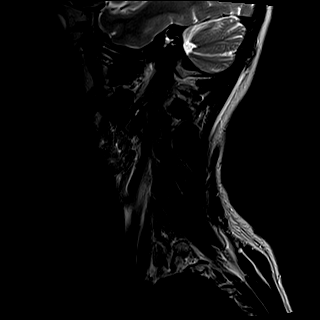
[im 5/18]
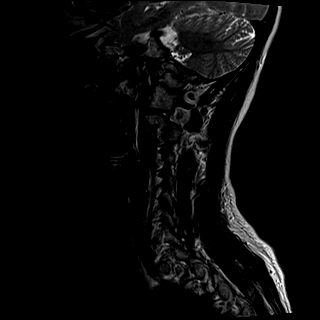
[im 9/18]
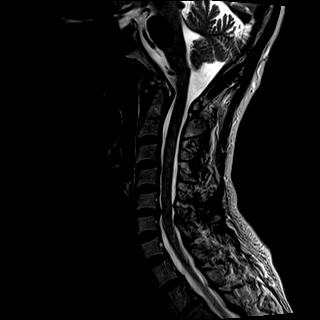
[im 13/18]
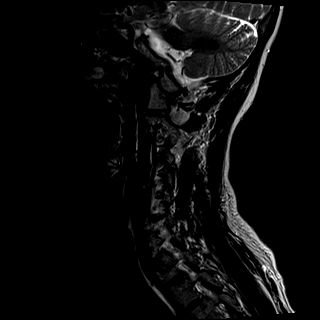
[im 18/18]
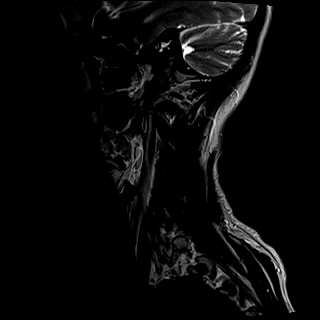

[Series 3: T1 · sagittal · 3.0mm · 0.41mm/px · 5 of 18 slices shown (1 of 3)]
[im 1/18]
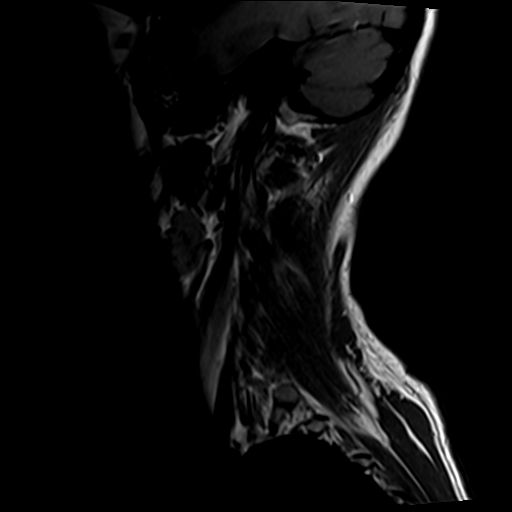
[im 5/18]
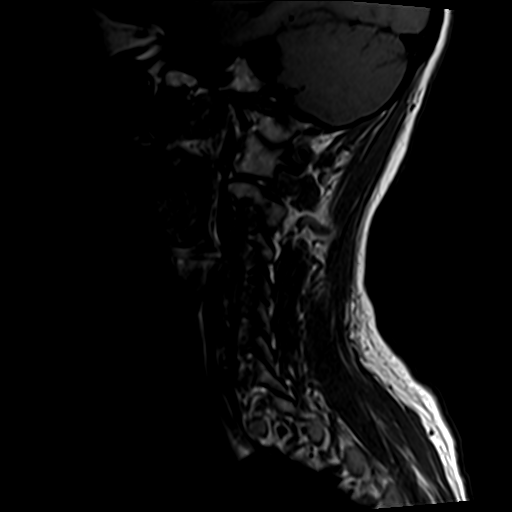
[im 9/18]
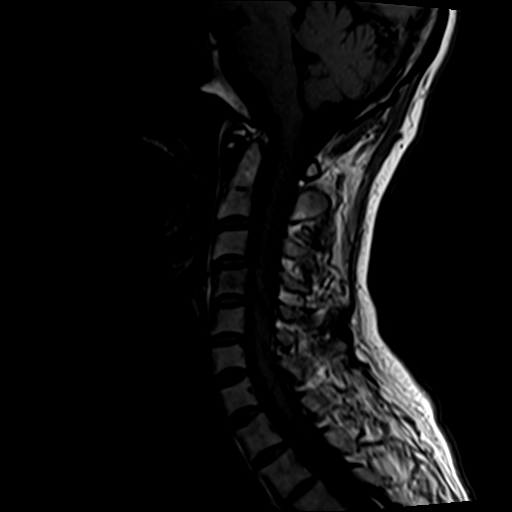
[im 13/18]
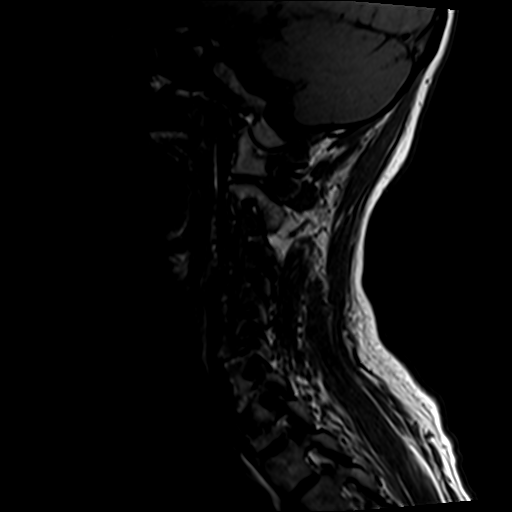
[im 18/18]
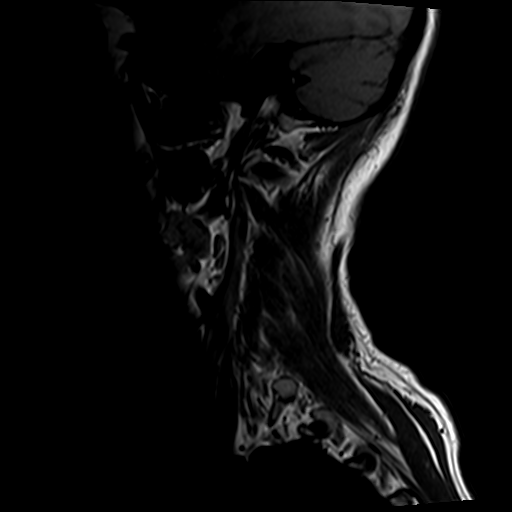

[Series 6: T2 · axial · 3.0mm · 0.70mm/px · z∈[-56,+39]mm · 7 of 25 slices shown (2 of 2)]
[im 1/25]
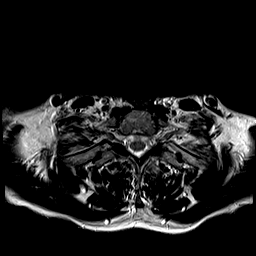
[im 5/25]
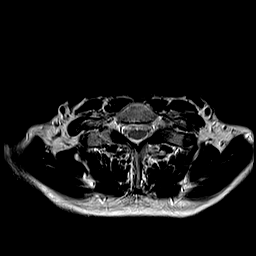
[im 9/25]
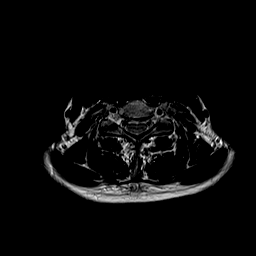
[im 13/25]
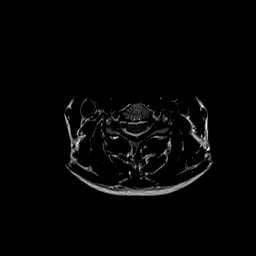
[im 17/25]
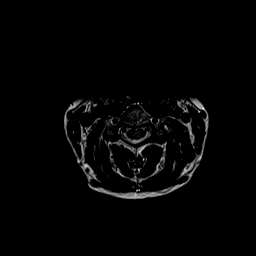
[im 21/25]
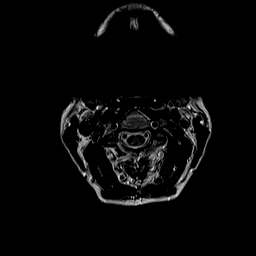
[im 25/25]
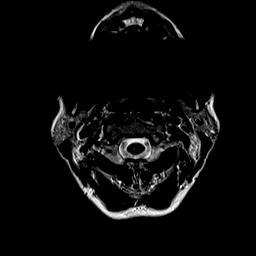

[Series 7: T1 · axial · 3.0mm · 0.35mm/px · z∈[-59,+40]mm · 7 of 27 slices shown (2 of 3)]
[im 1/27]
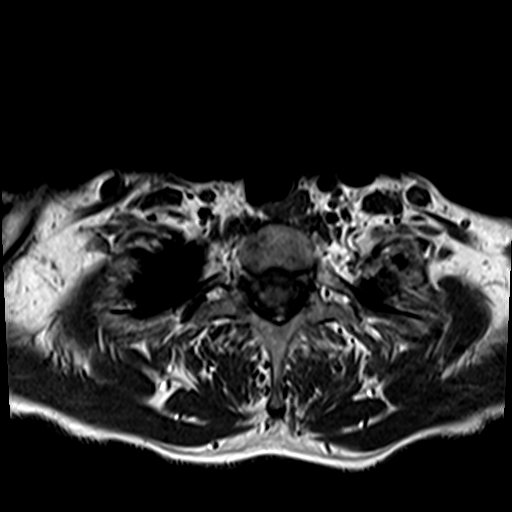
[im 5/27]
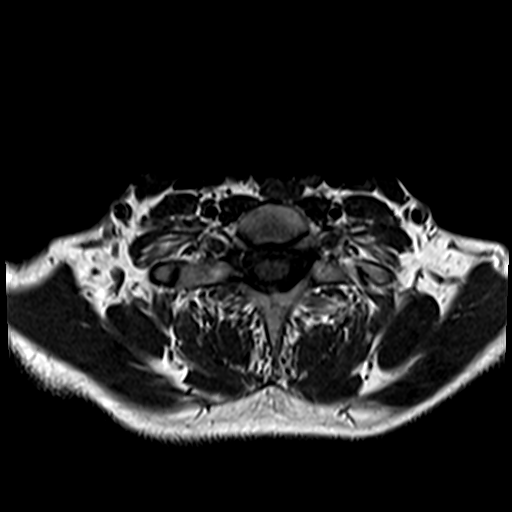
[im 9/27]
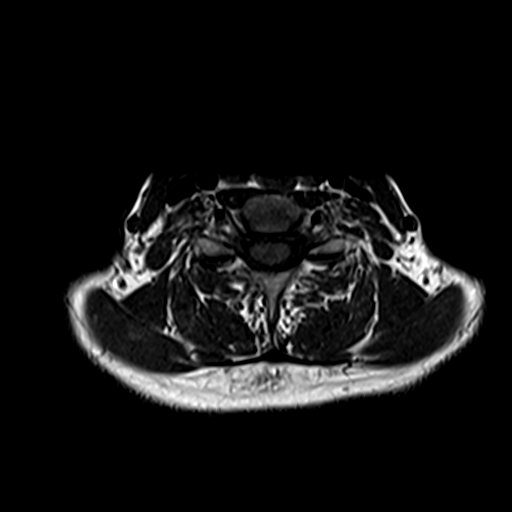
[im 14/27]
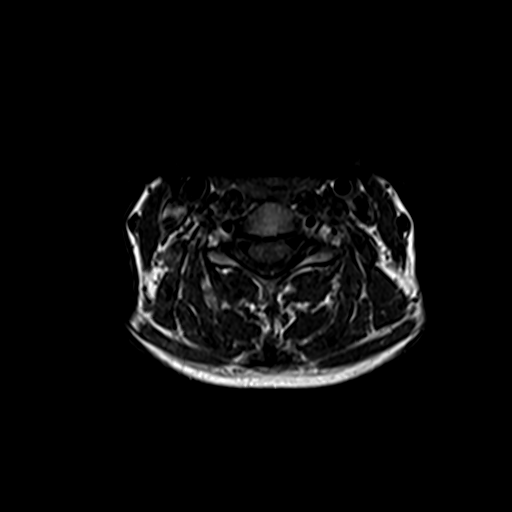
[im 18/27]
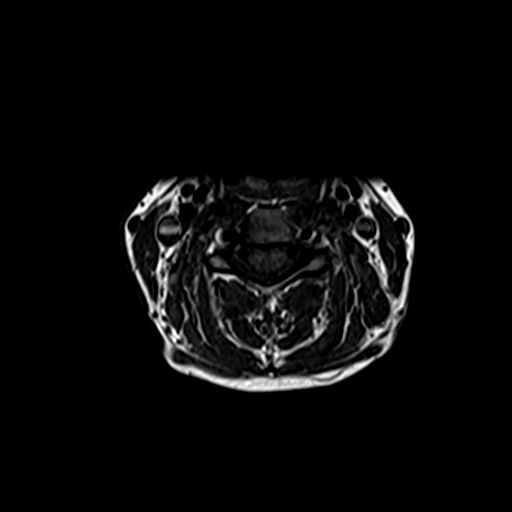
[im 22/27]
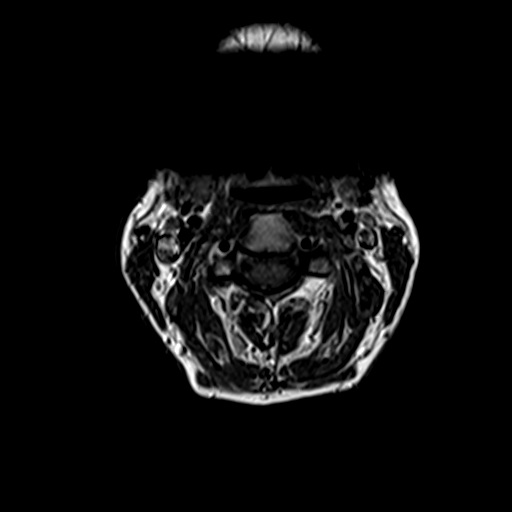
[im 27/27]
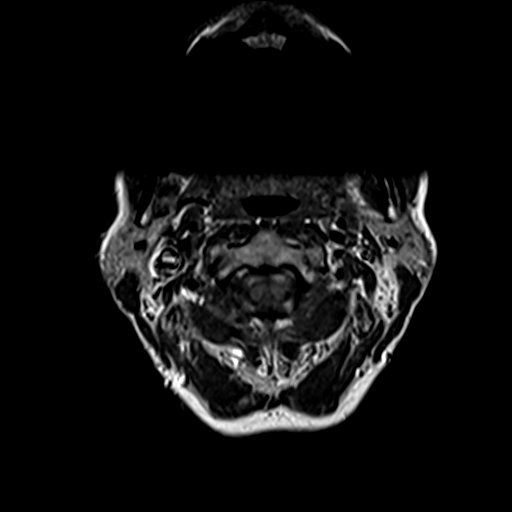

[Series 8: T1 · axial · 3.0mm · 0.35mm/px · z∈[-59,-29]mm · 3 of 27 slices shown (3 of 3)]
[im 1/27]
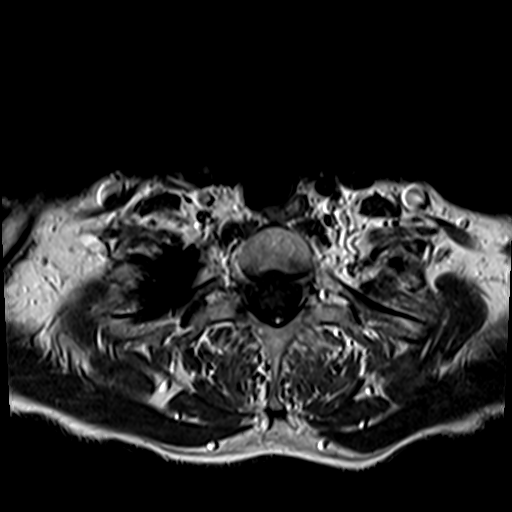
[im 5/27]
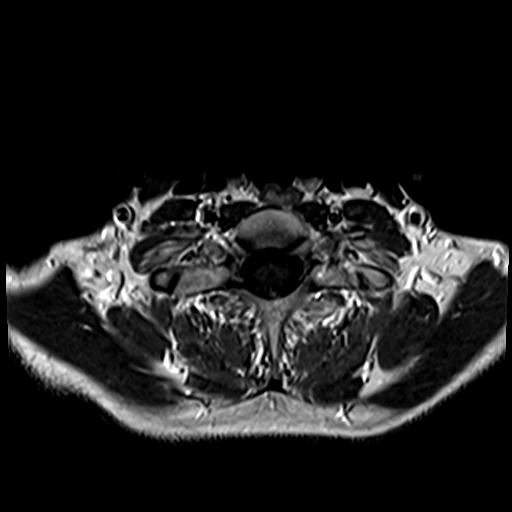
[im 9/27]
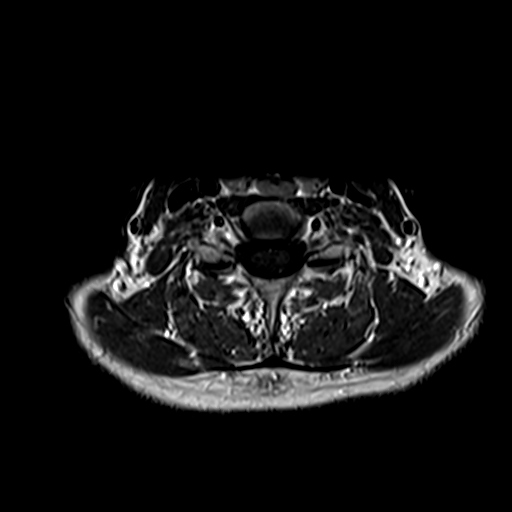

[27 of 48 positions shown; findings below may reference images not displayed]

FINDINGS: Alignment: Trace anterolisthesis of C3 over C4.

Vertebrae: A 2 mm T1 hypointense, enhancing lesion in the C4
superior endplate. No evidence of discitis or fracture.

Cord: Normal signal and morphology.

Posterior Fossa, vertebral arteries, paraspinal tissues: Negative.

Disc levels:

Tiny right posterolateral disc protrusion at C5-6 causing minimal
indentation of the thecal sac. No significant spinal canal or neural
foraminal stenosis at any level.
IMPRESSION: 1. Diminutive (2 mm) enhancing lesion in the C4 vertebral body.
Given provided history of malignancy, metastatic lesion can not be
entirely excluded.
2. No significant degenerative changes, spinal canal or neural
foraminal stenosis.
3. No cord compression or intrinsic cord lesion identified.

## 2021-08-09 MED ORDER — GADOBENATE DIMEGLUMINE 529 MG/ML IV SOLN
10.0000 mL | Freq: Once | INTRAVENOUS | Status: AC | PRN
  Administered 2021-08-09: 10 mL via INTRAVENOUS

## 2021-08-10 ENCOUNTER — Other Ambulatory Visit: Payer: Self-pay | Admitting: Rheumatology

## 2021-08-10 MED ORDER — AMPHETAMINE-DEXTROAMPHET ER 20 MG PO CP24: 20.0000 mg | ORAL_CAPSULE | Freq: Every day | ORAL | 0 refills | Status: DC

## 2021-08-10 NOTE — Telephone Encounter (Signed)
Next Visit: 10/16/2021  Last Visit: 07/31/2021  Last Fill: 07/11/2021  Current Dose per office note on 07/31/2021: not discussed  Okay to refill Adderall?

## 2021-08-10 NOTE — Telephone Encounter (Signed)
Patient request refill on Adderall sent to [facility].

## 2021-08-13 ENCOUNTER — Encounter: Payer: Self-pay | Admitting: Oncology

## 2021-08-14 ENCOUNTER — Telehealth: Payer: Self-pay | Admitting: *Deleted

## 2021-08-14 NOTE — Telephone Encounter (Signed)
Received call from patient stating she was supposed to have an echo prior to her next herceptin but she never received a call to schedule. She also states she just needs a lab appt on 10/13 and not lab/flush as she gets her labs drawn peripherally due to PT/INR draw.   Appt made for echo for 10/7 at 9am. Patient aware and also changed appt to just lab per patient.

## 2021-08-17 ENCOUNTER — Ambulatory Visit (HOSPITAL_COMMUNITY)
Admission: RE | Admit: 2021-08-17 | Discharge: 2021-08-17 | Disposition: A | Discharge status: Home / Self Care | Payer: 59 | Source: Ambulatory Visit | Attending: Oncology | Admitting: Oncology

## 2021-08-17 ENCOUNTER — Other Ambulatory Visit: Payer: Self-pay

## 2021-08-17 DIAGNOSIS — I351 Nonrheumatic aortic (valve) insufficiency: Secondary | ICD-10-CM | POA: Insufficient documentation

## 2021-08-17 DIAGNOSIS — C787 Secondary malignant neoplasm of liver and intrahepatic bile duct: Secondary | ICD-10-CM | POA: Diagnosis not present

## 2021-08-17 DIAGNOSIS — Z86711 Personal history of pulmonary embolism: Secondary | ICD-10-CM | POA: Insufficient documentation

## 2021-08-17 DIAGNOSIS — Z86718 Personal history of other venous thrombosis and embolism: Secondary | ICD-10-CM | POA: Diagnosis not present

## 2021-08-17 DIAGNOSIS — Z7189 Other specified counseling: Secondary | ICD-10-CM

## 2021-08-17 DIAGNOSIS — C50511 Malignant neoplasm of lower-outer quadrant of right female breast: Secondary | ICD-10-CM | POA: Diagnosis not present

## 2021-08-17 DIAGNOSIS — Z79899 Other long term (current) drug therapy: Secondary | ICD-10-CM | POA: Diagnosis not present

## 2021-08-17 DIAGNOSIS — Z0189 Encounter for other specified special examinations: Secondary | ICD-10-CM | POA: Diagnosis not present

## 2021-08-17 DIAGNOSIS — Z5181 Encounter for therapeutic drug level monitoring: Secondary | ICD-10-CM | POA: Insufficient documentation

## 2021-08-17 DIAGNOSIS — Z170 Estrogen receptor positive status [ER+]: Secondary | ICD-10-CM | POA: Diagnosis not present

## 2021-08-17 DIAGNOSIS — L930 Discoid lupus erythematosus: Secondary | ICD-10-CM | POA: Insufficient documentation

## 2021-08-17 LAB — ECHOCARDIOGRAM COMPLETE
Area-P 1/2: 4.41 cm2
P 1/2 time: 601 msec
S' Lateral: 3.4 cm

## 2021-08-17 NOTE — Progress Notes (Signed)
  Echocardiogram 2D Echocardiogram has been performed.  Matilde Bash 08/17/2021, 10:13 AM

## 2021-08-23 ENCOUNTER — Other Ambulatory Visit: Payer: 59

## 2021-08-23 ENCOUNTER — Inpatient Hospital Stay: Payer: 59 | Attending: Oncology

## 2021-08-23 ENCOUNTER — Other Ambulatory Visit: Payer: Self-pay | Admitting: Oncology

## 2021-08-23 ENCOUNTER — Inpatient Hospital Stay: Payer: 59

## 2021-08-23 ENCOUNTER — Other Ambulatory Visit: Payer: Self-pay

## 2021-08-23 VITALS — BP 105/62 | HR 71 | Temp 98.6°F | Resp 16 | Wt 114.2 lb

## 2021-08-23 DIAGNOSIS — C50511 Malignant neoplasm of lower-outer quadrant of right female breast: Secondary | ICD-10-CM

## 2021-08-23 DIAGNOSIS — Z5112 Encounter for antineoplastic immunotherapy: Secondary | ICD-10-CM | POA: Insufficient documentation

## 2021-08-23 DIAGNOSIS — C787 Secondary malignant neoplasm of liver and intrahepatic bile duct: Secondary | ICD-10-CM | POA: Insufficient documentation

## 2021-08-23 DIAGNOSIS — Z170 Estrogen receptor positive status [ER+]: Secondary | ICD-10-CM | POA: Diagnosis not present

## 2021-08-23 DIAGNOSIS — Z86718 Personal history of other venous thrombosis and embolism: Secondary | ICD-10-CM | POA: Diagnosis not present

## 2021-08-23 LAB — CBC WITH DIFFERENTIAL/PLATELET
Abs Immature Granulocytes: 0 10*3/uL (ref 0.00–0.07)
Basophils Absolute: 0.1 10*3/uL (ref 0.0–0.1)
Basophils Relative: 3 %
Eosinophils Absolute: 0 10*3/uL (ref 0.0–0.5)
Eosinophils Relative: 2 %
HCT: 39.3 % (ref 36.0–46.0)
Hemoglobin: 13.3 g/dL (ref 12.0–15.0)
Immature Granulocytes: 0 %
Lymphocytes Relative: 46 %
Lymphs Abs: 1.3 10*3/uL (ref 0.7–4.0)
MCH: 33.5 pg (ref 26.0–34.0)
MCHC: 33.8 g/dL (ref 30.0–36.0)
MCV: 99 fL (ref 80.0–100.0)
Monocytes Absolute: 0.2 10*3/uL (ref 0.1–1.0)
Monocytes Relative: 6 %
Neutro Abs: 1.2 10*3/uL — ABNORMAL LOW (ref 1.7–7.7)
Neutrophils Relative %: 43 %
Platelets: 206 10*3/uL (ref 150–400)
RBC: 3.97 MIL/uL (ref 3.87–5.11)
RDW: 11.8 % (ref 11.5–15.5)
WBC: 2.8 10*3/uL — ABNORMAL LOW (ref 4.0–10.5)
nRBC: 0 % (ref 0.0–0.2)

## 2021-08-23 LAB — CMP (CANCER CENTER ONLY)
ALT: 18 U/L (ref 0–44)
AST: 22 U/L (ref 15–41)
Albumin: 4.2 g/dL (ref 3.5–5.0)
Alkaline Phosphatase: 86 U/L (ref 38–126)
Anion gap: 6 (ref 5–15)
BUN: 23 mg/dL — ABNORMAL HIGH (ref 6–20)
CO2: 30 mmol/L (ref 22–32)
Calcium: 9.1 mg/dL (ref 8.9–10.3)
Chloride: 99 mmol/L (ref 98–111)
Creatinine: 0.89 mg/dL (ref 0.44–1.00)
GFR, Estimated: >60 mL/min (ref >60)
Glucose, Bld: 73 mg/dL (ref 70–99)
Potassium: 4.1 mmol/L (ref 3.5–5.1)
Sodium: 135 mmol/L (ref 135–145)
Total Bilirubin: 0.8 mg/dL (ref 0.3–1.2)
Total Protein: 6.8 g/dL (ref 6.5–8.1)

## 2021-08-23 LAB — PROTIME-INR
INR: 2 — ABNORMAL HIGH (ref 0.8–1.2)
Prothrombin Time: 22.6 seconds — ABNORMAL HIGH (ref 11.4–15.2)

## 2021-08-23 MED ORDER — TRASTUZUMAB-ANNS CHEMO 150 MG IV SOLR
6.0000 mg/kg | Freq: Once | INTRAVENOUS | Status: AC
  Administered 2021-08-23: 315 mg via INTRAVENOUS
  Filled 2021-08-23: qty 15

## 2021-08-23 MED ORDER — SODIUM CHLORIDE 0.9% FLUSH
10.0000 mL | INTRAVENOUS | Status: DC | PRN
  Administered 2021-08-23: 10 mL

## 2021-08-23 MED ORDER — ACETAMINOPHEN 325 MG PO TABS
650.0000 mg | ORAL_TABLET | Freq: Once | ORAL | Status: AC
  Administered 2021-08-23: 650 mg via ORAL
  Filled 2021-08-23: qty 2

## 2021-08-23 MED ORDER — DIPHENHYDRAMINE HCL 25 MG PO CAPS
25.0000 mg | ORAL_CAPSULE | Freq: Once | ORAL | Status: AC
  Administered 2021-08-23: 25 mg via ORAL
  Filled 2021-08-23: qty 1

## 2021-08-23 MED ORDER — SODIUM CHLORIDE 0.9 % IV SOLN: Freq: Once | INTRAVENOUS | Status: AC

## 2021-08-23 MED ORDER — HEPARIN SOD (PORK) LOCK FLUSH 100 UNIT/ML IV SOLN
500.0000 [IU] | Freq: Once | INTRAVENOUS | Status: AC | PRN
  Administered 2021-08-23: 500 [IU]

## 2021-08-23 NOTE — Patient Instructions (Signed)
[facility]  Discharge Instructions: Thank you for choosing [facility] to provide your oncology and hematology care.   If you have a lab appointment with the Cancer Center, please go directly to the Cancer Center and check in at the registration area.   Wear comfortable clothing and clothing appropriate for easy access to any Portacath or PICC line.   We strive to give you quality time with your provider. You may need to reschedule your appointment if you arrive late (15 or more minutes).  Arriving late affects you and other patients whose appointments are after yours.  Also, if you miss three or more appointments without notifying the office, you may be dismissed from the clinic at the provider's discretion.      For prescription refill requests, have your pharmacy contact our office and allow 72 hours for refills to be completed.    Today you received the following chemotherapy and/or immunotherapy agents trastuzumab      To help prevent nausea and vomiting after your treatment, we encourage you to take your nausea medication as directed.  BELOW ARE SYMPTOMS THAT SHOULD BE REPORTED IMMEDIATELY: *FEVER GREATER THAN 100.4 F (38 C) OR HIGHER *CHILLS OR SWEATING *NAUSEA AND VOMITING THAT IS NOT CONTROLLED WITH YOUR NAUSEA MEDICATION *UNUSUAL SHORTNESS OF BREATH *UNUSUAL BRUISING OR BLEEDING *URINARY PROBLEMS (pain or burning when urinating, or frequent urination) *BOWEL PROBLEMS (unusual diarrhea, constipation, pain near the anus) TENDERNESS IN MOUTH AND THROAT WITH OR WITHOUT PRESENCE OF ULCERS (sore throat, sores in mouth, or a toothache) UNUSUAL RASH, SWELLING OR PAIN  UNUSUAL VAGINAL DISCHARGE OR ITCHING   Items with * indicate a potential emergency and should be followed up as soon as possible or go to the Emergency Department if any problems should occur.  Please show the CHEMOTHERAPY ALERT CARD or IMMUNOTHERAPY ALERT CARD at check-in to  the Emergency Department and triage nurse.  Should you have questions after your visit or need to cancel or reschedule your appointment, please contact [facility]  Dept: [phone]  and follow the prompts.  Office hours are 8:00 a.m. to 4:30 p.m. Monday - Friday. Please note that voicemails left after 4:00 p.m. may not be returned until the following business day.  We are closed weekends and major holidays. You have access to a nurse at all times for urgent questions. Please call the main number to the clinic Dept: [phone] and follow the prompts.   For any non-urgent questions, you may also contact your provider using MyChart. We now offer e-Visits for anyone 18 and older to request care online for non-urgent symptoms. For details visit [email or web address].   Also download the MyChart app! Go to the app store, search "MyChart", open the app, select [facility], and log in with your MyChart username and password.  Due to Covid, a mask is required upon entering the hospital/clinic. If you do not have a mask, one will be given to you upon arrival. For doctor visits, patients may have 1 support person aged 18 or older with them. For treatment visits, patients cannot have anyone with them due to current Covid guidelines and our immunocompromised population.   

## 2021-08-24 LAB — CANCER ANTIGEN 27.29: CA 27.29: 64.9 U/mL — ABNORMAL HIGH (ref 0.0–38.6)

## 2021-08-30 ENCOUNTER — Other Ambulatory Visit: Payer: Self-pay | Admitting: Oncology

## 2021-08-30 DIAGNOSIS — C50511 Malignant neoplasm of lower-outer quadrant of right female breast: Secondary | ICD-10-CM

## 2021-08-30 DIAGNOSIS — Z170 Estrogen receptor positive status [ER+]: Secondary | ICD-10-CM

## 2021-09-10 ENCOUNTER — Other Ambulatory Visit: Payer: Self-pay

## 2021-09-10 MED ORDER — AMPHETAMINE-DEXTROAMPHET ER 20 MG PO CP24: 20.0000 mg | ORAL_CAPSULE | Freq: Every day | ORAL | 0 refills | Status: DC

## 2021-09-10 NOTE — Telephone Encounter (Signed)
Next Visit: 10/16/2021  Last Visit: 07/31/2021  Last Fill: 08/10/2021  Dx: Other organ or system involvement in systemic lupus erythematosus   Current Dose per office note on 07/31/2021: not mentioned.  Okay to refill Adderall?

## 2021-09-10 NOTE — Telephone Encounter (Signed)
Patient called requesting prescription refill of Adderall to be sent to [facility] at [address].   

## 2021-09-13 ENCOUNTER — Inpatient Hospital Stay: Payer: 59 | Attending: Oncology

## 2021-09-13 ENCOUNTER — Inpatient Hospital Stay: Payer: 59

## 2021-09-13 ENCOUNTER — Other Ambulatory Visit: Payer: Self-pay

## 2021-09-13 ENCOUNTER — Encounter: Payer: Self-pay | Admitting: Oncology

## 2021-09-13 ENCOUNTER — Other Ambulatory Visit: Payer: Self-pay | Admitting: Oncology

## 2021-09-13 VITALS — BP 106/58 | HR 70 | Temp 98.5°F | Resp 16 | Wt 115.5 lb

## 2021-09-13 DIAGNOSIS — C787 Secondary malignant neoplasm of liver and intrahepatic bile duct: Secondary | ICD-10-CM | POA: Insufficient documentation

## 2021-09-13 DIAGNOSIS — M3219 Other organ or system involvement in systemic lupus erythematosus: Secondary | ICD-10-CM

## 2021-09-13 DIAGNOSIS — Z7901 Long term (current) use of anticoagulants: Secondary | ICD-10-CM | POA: Diagnosis not present

## 2021-09-13 DIAGNOSIS — Z170 Estrogen receptor positive status [ER+]: Secondary | ICD-10-CM | POA: Diagnosis not present

## 2021-09-13 DIAGNOSIS — Z86718 Personal history of other venous thrombosis and embolism: Secondary | ICD-10-CM | POA: Insufficient documentation

## 2021-09-13 DIAGNOSIS — Z5112 Encounter for antineoplastic immunotherapy: Secondary | ICD-10-CM | POA: Insufficient documentation

## 2021-09-13 DIAGNOSIS — L930 Discoid lupus erythematosus: Secondary | ICD-10-CM

## 2021-09-13 DIAGNOSIS — C50511 Malignant neoplasm of lower-outer quadrant of right female breast: Secondary | ICD-10-CM | POA: Insufficient documentation

## 2021-09-13 DIAGNOSIS — Z79899 Other long term (current) drug therapy: Secondary | ICD-10-CM

## 2021-09-13 DIAGNOSIS — M359 Systemic involvement of connective tissue, unspecified: Secondary | ICD-10-CM

## 2021-09-13 LAB — COMPREHENSIVE METABOLIC PANEL
ALT: 13 U/L (ref 0–44)
AST: 20 U/L (ref 15–41)
Albumin: 3.8 g/dL (ref 3.5–5.0)
Alkaline Phosphatase: 101 U/L (ref 38–126)
Anion gap: 8 (ref 5–15)
BUN: 19 mg/dL (ref 6–20)
CO2: 26 mmol/L (ref 22–32)
Calcium: 8.7 mg/dL — ABNORMAL LOW (ref 8.9–10.3)
Chloride: 104 mmol/L (ref 98–111)
Creatinine, Ser: 0.89 mg/dL (ref 0.44–1.00)
GFR, Estimated: >60 mL/min (ref >60)
Glucose, Bld: 84 mg/dL (ref 70–99)
Potassium: 4 mmol/L (ref 3.5–5.1)
Sodium: 138 mmol/L (ref 135–145)
Total Bilirubin: 0.4 mg/dL (ref 0.3–1.2)
Total Protein: 6.6 g/dL (ref 6.5–8.1)

## 2021-09-13 LAB — CBC WITH DIFFERENTIAL/PLATELET
Abs Immature Granulocytes: 0 10*3/uL (ref 0.00–0.07)
Basophils Absolute: 0.1 10*3/uL (ref 0.0–0.1)
Basophils Relative: 2 %
Eosinophils Absolute: 0 10*3/uL (ref 0.0–0.5)
Eosinophils Relative: 1 %
HCT: 36.4 % (ref 36.0–46.0)
Hemoglobin: 12.4 g/dL (ref 12.0–15.0)
Immature Granulocytes: 0 %
Lymphocytes Relative: 42 %
Lymphs Abs: 1.5 10*3/uL (ref 0.7–4.0)
MCH: 33.2 pg (ref 26.0–34.0)
MCHC: 34.1 g/dL (ref 30.0–36.0)
MCV: 97.6 fL (ref 80.0–100.0)
Monocytes Absolute: 0.5 10*3/uL (ref 0.1–1.0)
Monocytes Relative: 13 %
Neutro Abs: 1.6 10*3/uL — ABNORMAL LOW (ref 1.7–7.7)
Neutrophils Relative %: 42 %
Platelets: 217 10*3/uL (ref 150–400)
RBC: 3.73 MIL/uL — ABNORMAL LOW (ref 3.87–5.11)
RDW: 12.9 % (ref 11.5–15.5)
WBC: 3.7 10*3/uL — ABNORMAL LOW (ref 4.0–10.5)
nRBC: 0 % (ref 0.0–0.2)

## 2021-09-13 LAB — PROTIME-INR
INR: 2.2 — ABNORMAL HIGH (ref 0.8–1.2)
Prothrombin Time: 24.2 s — ABNORMAL HIGH (ref 11.4–15.2)

## 2021-09-13 MED ORDER — SODIUM CHLORIDE 0.9% FLUSH
10.0000 mL | INTRAVENOUS | Status: DC | PRN
  Administered 2021-09-13: 10 mL

## 2021-09-13 MED ORDER — ACETAMINOPHEN 325 MG PO TABS
650.0000 mg | ORAL_TABLET | Freq: Once | ORAL | Status: AC
  Administered 2021-09-13: 650 mg via ORAL
  Filled 2021-09-13: qty 2

## 2021-09-13 MED ORDER — TRASTUZUMAB-ANNS CHEMO 150 MG IV SOLR
6.0000 mg/kg | Freq: Once | INTRAVENOUS | Status: AC
  Administered 2021-09-13: 315 mg via INTRAVENOUS
  Filled 2021-09-13: qty 15

## 2021-09-13 MED ORDER — DIPHENHYDRAMINE HCL 25 MG PO CAPS
25.0000 mg | ORAL_CAPSULE | Freq: Once | ORAL | Status: AC
  Administered 2021-09-13: 25 mg via ORAL
  Filled 2021-09-13: qty 1

## 2021-09-13 MED ORDER — SODIUM CHLORIDE 0.9 % IV SOLN: Freq: Once | INTRAVENOUS | Status: AC

## 2021-09-13 MED ORDER — HEPARIN SOD (PORK) LOCK FLUSH 100 UNIT/ML IV SOLN
500.0000 [IU] | Freq: Once | INTRAVENOUS | Status: AC | PRN
  Administered 2021-09-13: 500 [IU]

## 2021-09-13 NOTE — Progress Notes (Signed)
Erica Wood came today for treatment and was concerned because there is been an apparent drop in her ejection fraction.  We reviewed that and I was able to reassure her.  She also had some issues with her left shoulder and of course we had obtained films of her neck and we discussed that as well.  I did send Dr. Ander Slade a note asking him to review her most recent echo and it might be a good idea if we schedule an appointment with him at the next echo which would be in January.

## 2021-09-13 NOTE — Patient Instructions (Signed)
[facility]  Discharge Instructions: ?Thank you for choosing [facility] to provide your oncology and hematology care.  ? ?If you have a lab appointment with the Cancer Center, please go directly to the Cancer Center and check in at the registration area. ?  ?Wear comfortable clothing and clothing appropriate for easy access to any Portacath or PICC line.  ? ?We strive to give you quality time with your provider. You may need to reschedule your appointment if you arrive late (15 or more minutes).  Arriving late affects you and other patients whose appointments are after yours.  Also, if you miss three or more appointments without notifying the office, you may be dismissed from the clinic at the provider?s discretion.    ?  ?For prescription refill requests, have your pharmacy contact our office and allow 72 hours for refills to be completed.   ? ?Today you received the following chemotherapy and/or immunotherapy agent: Trastuzumab ?  ?To help prevent nausea and vomiting after your treatment, we encourage you to take your nausea medication as directed. ? ?BELOW ARE SYMPTOMS THAT SHOULD BE REPORTED IMMEDIATELY: ?*FEVER GREATER THAN 100.4 F (38 ?C) OR HIGHER ?*CHILLS OR SWEATING ?*NAUSEA AND VOMITING THAT IS NOT CONTROLLED WITH YOUR NAUSEA MEDICATION ?*UNUSUAL SHORTNESS OF BREATH ?*UNUSUAL BRUISING OR BLEEDING ?*URINARY PROBLEMS (pain or burning when urinating, or frequent urination) ?*BOWEL PROBLEMS (unusual diarrhea, constipation, pain near the anus) ?TENDERNESS IN MOUTH AND THROAT WITH OR WITHOUT PRESENCE OF ULCERS (sore throat, sores in mouth, or a toothache) ?UNUSUAL RASH, SWELLING OR PAIN  ?UNUSUAL VAGINAL DISCHARGE OR ITCHING  ? ?Items with * indicate a potential emergency and should be followed up as soon as possible or go to the Emergency Department if any problems should occur. ? ?Please show the CHEMOTHERAPY ALERT CARD or IMMUNOTHERAPY ALERT CARD at check-in to  the Emergency Department and triage nurse. ? ?Should you have questions after your visit or need to cancel or reschedule your appointment, please contact [facility]  Dept: [phone]  and follow the prompts.  Office hours are 8:00 a.m. to 4:30 p.m. Monday - Friday. Please note that voicemails left after 4:00 p.m. may not be returned until the following business day.  We are closed weekends and major holidays. You have access to a nurse at all times for urgent questions. Please call the main number to the clinic Dept: [phone] and follow the prompts. ? ? ?For any non-urgent questions, you may also contact your provider using MyChart. We now offer e-Visits for anyone 18 and older to request care online for non-urgent symptoms. For details visit [email or web address]. ?  ?Also download the MyChart app! Go to the app store, search "MyChart", open the app, select [facility], and log in with your MyChart username and password. ? ?Due to Covid, a mask is required upon entering the hospital/clinic. If you do not have a mask, one will be given to you upon arrival. For doctor visits, patients may have 1 support person aged 18 or older with them. For treatment visits, patients cannot have anyone with them due to current Covid guidelines and our immunocompromised population.  ? ?

## 2021-09-20 ENCOUNTER — Other Ambulatory Visit: Payer: Self-pay | Admitting: Oncology

## 2021-09-25 ENCOUNTER — Telehealth: Payer: Self-pay | Admitting: Family Medicine

## 2021-09-25 ENCOUNTER — Other Ambulatory Visit: Payer: Self-pay | Admitting: Family Medicine

## 2021-09-25 DIAGNOSIS — G8929 Other chronic pain: Secondary | ICD-10-CM

## 2021-09-25 DIAGNOSIS — M545 Low back pain, unspecified: Secondary | ICD-10-CM

## 2021-09-25 DIAGNOSIS — C50919 Malignant neoplasm of unspecified site of unspecified female breast: Secondary | ICD-10-CM

## 2021-09-25 DIAGNOSIS — M25561 Pain in right knee: Secondary | ICD-10-CM

## 2021-09-25 NOTE — Telephone Encounter (Signed)
Records emailed to [facility]

## 2021-10-02 NOTE — Progress Notes (Signed)
[place]  [phone] Fax:([phone]) [phone]     ID: Slyvia Lartigue Duerst DOB: [birth year]  MR#: [id number]  SJG#:[id number]  Patient Care Team: Eunice Blase, MD as PCP - General (Family Medicine) Prudence Davidson, [place] Bing, MD as Referring Physician (Oncology) Rockwell Germany, RN as Oncology Nurse Navigator Mauro Kaufmann, RN as Oncology Nurse Navigator Rolm Bookbinder, MD as Consulting Physician (General Surgery) Royann Wildasin, Virgie Dad, MD as Consulting Physician (Oncology) Kyung Rudd, MD as Consulting Physician (Radiation Oncology) Bo Merino, MD as Consulting Physician (Rheumatology) Olga Millers, MD as Consulting Physician (Obstetrics and Gynecology) Larey Dresser, MD as Consulting Physician (Cardiology) Armbruster, Carlota Raspberry, MD as Consulting Physician (Gastroenterology) Nena Jordan, MD as Referring Physician (Ophthalmology) Nena Jordan, MD as Referring Physician (Ophthalmology) Chauncey Cruel, MD OTHER MD:  CHIEF COMPLAINT: triple positive stage IV breast cancer  CURRENT TREATMENT: herceptin; anastrozole, palbociclib; coumadin   INTERVAL HISTORY: Marsia returns today for follow up and treatment of her triple positive breast cancer.   Paighton continues on Palbociclib.  She takes this at 173m po days 1-21, with 7 days off on a 28 day cycle.  She takes it at night tolerates this well.    She receives Herceptin every three weeks with good tolerance. Repeat echo on 08/17/2021 showed an ejection fraction of 55-60%, which is only slightly lower than prior in 04/2021.  I discussed with Dr. MAundra Dubinand he did not think we needed to postpone or change her treatment.  As noted below I am arranging follow-up with him with the next echo  She also takes Anastrozole daily.  She does have some hot flashes.  She was prescribed Effexor for these, but has not started taking it.    Since her last visit, she underwent cervical spine MRI on 08/09/2021  to evaluate her arm pain. The showed: diminutive (2 mm) enhancing lesion in C4 vertebral body; no significant degenerative changes, stenosis, or cord compression.   Lab Results  Component Value Date   CA2729 64.9 (H) 08/23/2021   CA2729 68.1 (H) 08/02/2021   CA2729 65.8 (H) 07/11/2021   CA2729 68.9 (H) 06/20/2021   CA2729 69.2 (H) 05/30/2021    REVIEW OF SYSTEMS: PRondellof course has her rheumatologic problems followed by Dr. DEstanislado Pandy  She takes Plaquenil for that which may lower her counts as much as the Ibrance can.  She has been offered immunotherapy but has been reluctant to receive it.  She does have aches and pains and joint swelling.  She is currently not exercising.  Detailed review of systems was otherwise stable   COVID 19 VACCINATION STATUS: Pfizer x1, refuses further vaccination   HISTORY OF CURRENT ILLNESS: From the original intake note:  PRenayeherself palpated an abnormality in the right breast several years ago.  This had not changed until sometime in June 2021.  She was evaluated by Dr. AOuida Sillsand underwent right diagnostic mammography with tomography [facility] showing: breast density category D; 2.6 cm mass in right breast at 6 o'clock, at site of palpable concern; two focal groups of punctate calcifications posterior to palpable mass; no right axillary adenopathy.  Accordingly on 08/04/2020 she proceeded to biopsy of the right breast area in question. The pathology from this procedure ([id number]) showed: invasive ductal carcinoma, grade 3. Prognostic indicators significant for: estrogen receptor, 95% positive with moderate staining intensity and progesterone receptor, 10% positive with strong staining intensity. Proliferation marker Ki67 at 40%.  HER2 equivocal by immunohistochemistry (2+), but positive by fluorescent in situ hybridization with a signals ratio 3.23 and number per cell 6.45.  Biopsy of the posterior  calcifications was benign.  She also underwent breast MRI on 08/11/2020 showing: breast composition D; 2.6 cm biopsy-proven malignancy in right breast at 6 o'clock, with probable skin involvement noted on previous ultrasound; additional enhancing masses within right breast-- 5 mm in lower-outer, 1.2 cm in upper-inner, 7 mm in upper-outer; no evidence of left breast malignancy or lymphadenopathy; heterogeneous enhancement of sternum, of uncertain significance.  The patient's subsequent history is as detailed below.   PAST MEDICAL HISTORY: Past Medical History:  Diagnosis Date   Anti-cardiolipin antibody positive    Anti-cardiolipin antibody syndrome (HCC)    Anxiety    Arthralgia    Arthritis    bil knees, hands   Atypical chest pain    Autoimmune disease ([place])    Breast cancer metastasized to liver ([place]) 09/2020   Cancer ([place]) 07/2020   right breast IDC   Chronic fatigue    Depression    DVT (deep venous thrombosis) (HCC)    IBS (irritable bowel syndrome)    with constipation   Lupus ([place])    Pulmonary embolus (HCC)    Raynaud's syndrome    Sicca ([place])    Thrombocytopenia ([place])     PAST SURGICAL HISTORY: Past Surgical History:  Procedure Laterality Date   ABLATION  12/2018   leg veins   ABLATION Right 08/2019   venous leg    APPENDECTOMY     COLON SURGERY     Perforated colon repair after colostomy   EYE SURGERY Bilateral 2022   GANGLION CYST EXCISION     HYSTEROSCOPY WITH D & C N/A 01/13/2014   Procedure: DILATATION AND CURETTAGE /HYSTEROSCOPY with resection ;  Surgeon: Olga Millers, MD;  Location: [facility];  Service: Gynecology;  Laterality: N/A;   LAPAROTOMY     PORTACATH PLACEMENT N/A 09/13/2020   Procedure: INSERTION PORT-A-CATH WITH ULTRASOUND GUIDANCE;  Surgeon: Rolm Bookbinder, MD;  Location: [facility];  Service: General;  Laterality: N/A;    FAMILY HISTORY: Family History  Problem Relation Age of Onset   Rheum arthritis Father     Hemachromatosis Father    Heart Problems Father    Cancer Sister        bone    Colon cancer Neg Hx    Stomach cancer Neg Hx    Esophageal cancer Neg Hx    Pancreatic cancer Neg Hx    Liver disease Neg Hx   as of October 2021 her father isage 57 and her mother age 59.. The patient had one sister (and no brothers). The sister was diagnosed with Ewing's sarcoma at age 69, and died from the disease after many difficult treatments.    GYNECOLOGIC HISTORY:  Last menstrual period occurred about 6 months prior to the start of chemotherapy Menarche: 53 or 55 years old GX P 0 LMP around 2019 Contraceptive: yes HRT no  Hysterectomy? no BSO? 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 She is widowed. She lives at home with her cat, Mariea Clonts. She is not a Ambulance person.    ADVANCED DIRECTIVES: not in place; at the 08/16/2020 visit the patient was given the appropriate documents to complete and notarized at her discretion.     HEALTH MAINTENANCE: Social History   Tobacco Use   Smoking status: Former  Packs/day: 0.50    Years: 5.00    Pack years: 2.50    Types: Cigarettes    Start date: 08/06/2006    Quit date: 08/07/2011    Years since quitting: 10.1   Smokeless tobacco: Never  Vaping Use   Vaping Use: Never used  Substance Use Topics   Alcohol use: Yes    Comment: occasionally   Drug use: No     Colonoscopy: 2002 for perforated bowel  PAP: 03/2020  Bone density: done at Dr. [facility] office   Allergies  Allergen Reactions   Beef-Derived Products Hives   Latex Rash    Contact   Sulfa Antibiotics Rash    Current Outpatient Medications  Medication Sig Dispense Refill   acetaminophen (TYLENOL) 500 MG tablet Take 1,000 mg by mouth every 6 (six) hours as needed for moderate pain or headache.     amphetamine-dextroamphetamine (ADDERALL XR) 20 MG 24 hr capsule Take 1 capsule (20 mg total) by mouth daily. 30 capsule 0    anastrozole (ARIMIDEX) 1 MG tablet Take 1 tablet (1 mg total) by mouth daily. 90 tablet 4   Ascorbic Acid (VITAMIN C PO) Take 3,000 mg by mouth daily.      Cholecalciferol (VITAMIN D3) 75 MCG (3000 UT) TABS Take 3,000 Units by mouth daily.     Clindamycin-Benzoyl Per, Refr, gel Apply topically.     hydroxychloroquine (PLAQUENIL) 200 MG tablet Take 1 tablet (200 mg total) by mouth daily. 90 tablet 4   IBRANCE 100 MG tablet TAKE 1 TABLET BY MOUTH ONCE DAILY WITH OR WITHOUT FOOD  FOR 21 DAYS, FOLLOWED BY 7  DAYS OFF, REPEATED EVERY 28 DAYS 21 tablet 6   lidocaine-prilocaine (EMLA) cream Apply topically.     metoCLOPramide (REGLAN) 10 MG tablet Take 5-10 mg by mouth as needed.     neomycin-polymyxin b-dexamethasone (MAXITROL) 3.5-10000-0.1 SUSP 1 drop 2 (two) times daily.     RESTASIS MULTIDOSE 0.05 % ophthalmic emulsion Place 1 drop into both eyes 2 (two) times daily. 11 mL 5   spironolactone (ALDACTONE) 25 MG tablet Take 25 mg by mouth daily as needed (swelling).     traMADol (ULTRAM) 50 MG tablet Take 1-2 tablets (50-100 mg total) by mouth every 6 (six) hours as needed. 60 tablet 0   tretinoin (RETIN-A) 0.1 % cream Apply 1 application topically at bedtime as needed (acne).      triamterene-hydrochlorothiazide (DYAZIDE) 37.5-25 MG capsule TAKE 1 CAPSULE BY MOUTH DAILY AS NEEDED FOR SWELLING 90 capsule 0   warfarin (COUMADIN) 5 MG tablet TAKE 1 TABLET(5 MG) BY MOUTH DAILY 30 tablet PRN   No current facility-administered medications for this visit.    OBJECTIVE: White woman who appears younger than stated age  48:   10/03/21 0829  BP: (!) 116/50  Pulse: 68  Resp: 17  Temp: (!) 97.5 F (36.4 C)  SpO2: 100%      Body mass index is 20 kg/m.   Wt Readings from Last 3 Encounters:  10/03/21 116 lb 8 oz (52.8 kg)  09/13/21 115 lb 8 oz (52.4 kg)  08/23/21 114 lb 4 oz (51.8 kg)     ECOG FS:2 - Symptomatic, <50% confined to bed  Sclerae unicteric, EOMs intact Wearing a mask No cervical  or supraclavicular adenopathy Lungs no rales or rhonchi Heart regular rate and rhythm Abd soft, nontender, positive bowel sounds MSK mild tenderness to vigorous palpation in the lower spine.  Some erythema and joint swelling in both hands as  previously noted Neuro: nonfocal, well oriented, appropriate affect Breasts: Deferred   LAB RESULTS:  CMP     Component Value Date/Time   NA 137 10/03/2021 0817   NA 137 06/01/2021 1047   K 4.6 10/03/2021 0817   CL 103 10/03/2021 0817   CO2 27 10/03/2021 0817   GLUCOSE 91 10/03/2021 0817   BUN 24 (H) 10/03/2021 0817   BUN 16 06/01/2021 1047   CREATININE 0.92 10/03/2021 0817   CREATININE 0.89 08/23/2021 0842   CALCIUM 8.9 10/03/2021 0817   PROT 6.7 10/03/2021 0817   PROT 6.6 06/01/2021 1047   ALBUMIN 3.9 10/03/2021 0817   ALBUMIN 4.4 06/01/2021 1047   AST 19 10/03/2021 0817   AST 22 08/23/2021 0842   ALT 15 10/03/2021 0817   ALT 18 08/23/2021 0842   ALKPHOS 96 10/03/2021 0817   BILITOT 0.4 10/03/2021 0817   BILITOT 0.8 08/23/2021 0842   GFRNONAA >60 10/03/2021 0817   GFRNONAA >60 08/23/2021 0842   GFRAA 95 07/28/2020 1108    No results found for: TOTALPROTELP, ALBUMINELP, A1GS, A2GS, BETS, BETA2SER, GAMS, MSPIKE, SPEI  Lab Results  Component Value Date   WBC 3.0 (L) 10/03/2021   NEUTROABS 1.5 (L) 10/03/2021   HGB 12.4 10/03/2021   HCT 37.1 10/03/2021   MCV 99.2 10/03/2021   PLT 142 (L) 10/03/2021    No results found for: LABCA2  No components found for: [id number]  Recent Labs  Lab 10/03/21 0811  INR 2.0*     No results found for: LABCA2  No results found for: YCX448  Lab Results  Component Value Date   CAN125 29.9 01/03/2021    No results found for: JEH631  Lab Results  Component Value Date   CA2729 64.9 (H) 08/23/2021    No components found for: HGQUANT  Lab Results  Component Value Date   CEA1 1.88 01/03/2021   /  CEA ([facility])  Date Value Ref Range Status  01/03/2021 1.88 0.00 - 5.00  ng/mL Final    Comment:    (NOTE) This test was performed using [employer]'s Chemiluminescent Microparticle Immunoassay. Values obtained from different assay methods cannot be used interchangeably. Please note that 5-10% of patients who smoke may see CEA levels up to 6.9 ng/mL. Performed at [facility], [place] [address], [place], [place] [address]      No results found for: AFPTUMOR  No results found for: CHROMOGRNA  No results found for: KPAFRELGTCHN, LAMBDASER, KAPLAMBRATIO (kappa/lambda light chains)  No results found for: HGBA, HGBA2QUANT, HGBFQUANT, HGBSQUAN (Hemoglobinopathy evaluation)   No results found for: LDH  Lab Results  Component Value Date   IRON 88 10/29/2018   IRON 88 10/29/2018   TIBC 241 (L) 10/29/2018   TIBC 241 (L) 10/29/2018   IRONPCTSAT 37 10/29/2018   IRONPCTSAT 37 10/29/2018   (Iron and TIBC)  Lab Results  Component Value Date   FERRITIN 43 10/29/2018   FERRITIN 43 10/29/2018    Urinalysis    Component Value Date/Time   APPEARANCEUR Clear 06/26/2021 1331   GLUCOSEU Negative 06/26/2021 1331   BILIRUBINUR Negative 06/26/2021 1331   PROTEINUR Negative 06/26/2021 1331   NITRITE Negative 06/26/2021 1331   LEUKOCYTESUR Negative 06/26/2021 1331    STUDIES: No results found.   ELIGIBLE FOR AVAILABLE RESEARCH PROTOCOL: AET  ASSESSMENT: 55 y.o. [place] woman status post right breast lower outer quadrant biopsy 08/04/2020 for a clinical T2 N0 M1, stage IV invasive ductal carcinoma, grade 3, triple positive, with an  MIB-1 of 40%  (a) breast MRI 08/14/2020 shows 3 additional right breast lesions and heterogeneous enhancement of the sternum  (b) biopsy of one of the additional right breast masses 09/05/2020 showed atypical lobular hyperplasia (concordant).  (c) CT scan of the abdomen and pelvis 07/14/2020 shows 2 indeterminate liver lesions  (d) abdominal MRI 08/24/2020 shows multiple liver lesions highly suspicious for  liver metastases.  (e) liver biopsy 09/20/2020 positive for carcinoma, prognostic panel estrogen and progesterone receptor positive, but HER-2 negative (0); MIB-1 of 30%  (f) bone scan 09/14/2020 shows no evidence of bone metastases  (g) CT scan of the chest and head 09/14/2020 showed no evidence of metastatic disease in the chest or brain  (h) CA 27-29 was 91.9 on 01/03/2021; CEA and CA-125 normal  (1) neoadjuvant chemotherapy consisting of docetaxel, carboplatin, trastuzumab and pertuzumab every 21 days x 6 started 08/31/2020, completed 12/14/2020  (a) pertuzumab omitted starting with cycle 4  (b) docetaxel omitted with final cycle, gemzar substituted  (2) anti-HER-2 immunotherapy (trastuzumab) Q 21 d to continue indefinitely  (a) echo 04/12/2020 shows an ejection fraction in the 60-65% range  (b) echo 09/25/2020 shows an ejection fraction in the 60 to 65% range  (c) echo 01/10/2021 shows an ejection fraction in the 60-65% range  (d) echo 04/24/2021 shows an ejection fraction in the 60-65% range  (e) echo 08/17/2021 showed an ejection fraction in the 55-60% range  (f) trastuzumab dose changed to 8 mg/kg and interval to every 4 weeks beginning with 10/03/2021 dose  (3) definitive surgery to follow depending on optimal response to systemic therapy  (4) adjuvant radiation to follow surgery as appropriate  (5) anastrozole started 01/29/2021  (a) palbociclib/Ibrance started 01/29/2021  (b) palbociclib dose decreased to 100 mg beginning with May 2022 cycle due to cytopenias  (6) staging studies:  (a) post-NEO chemotherapy breast MRI 12/26/2020 showed right breast index lesion decreased to 0.8 cm; 2 additional masses in the right breast stable  (b) abdominal MRI 01/29/2021 shows no active disease in the liver  (c) breast MRI 05/01/2021 shows continuing response in the breast  (d) MRI of the liver on 07/31/2021 stable, clotted lesion measuring 1.2 cm  (7) history LLE DVT/ PE 2003: on  lifelong anticoagulation (warfarin)   PLAN: Erica Wood is now a little over a year out from initial diagnosis of metastatic breast cancer involving the liver.  She has measurable disease in the breast, which has never undergone surgery, and liver.  Both sites show excellent control on her current treatment which we are continuing.  I am changing her trastuzumab to every 4 weeks to make it easier for her.  Of course she will have labs every time so we can also assess how she is doing on the palbociclib, which can lower her counts.  She is also on Plaquenil, which also can lower her counts.  [place] can be difficult to assess because she has multiple aches and pains which are really not related to her cancer.  She does have excellent follow-up through Dr. Estanislado Pandy.  Also Dr. Loralie Champagne is helping [place] from a cardio oncology point of view.  I have encouraged her to start a tai chi program which I think would relieve her symptoms more than anything else.  She will have her next treatment in 4 weeks and then treatment and reevaluation visit in 8 weeks.  She will have a repeat echocardiogram just before that  Total encounter time 25 minutes.Sarajane Jews C. Anaria Kroner, MD  10/03/21 8:55 AM Medical Oncology and Hematology [facility] [place], [place] [address] Tel. [phone]    Fax. [phone]   I, Wilburn Mylar, am acting as scribe for Dr. Virgie Dad. Bennett Vanscyoc.  I, Lurline Del MD, have reviewed the above documentation for accuracy and completeness, and I agree with the above.    *Total Encounter Time as defined by the Centers for Medicare and Medicaid Services includes, in addition to the face-to-face time of a patient visit (documented in the note above) non-face-to-face time: obtaining and reviewing outside history, ordering and reviewing medications, tests or procedures, care coordination (communications with other health care professionals or caregivers) and  documentation in the medical record.

## 2021-10-03 ENCOUNTER — Inpatient Hospital Stay: Payer: 59

## 2021-10-03 ENCOUNTER — Inpatient Hospital Stay (HOSPITAL_BASED_OUTPATIENT_CLINIC_OR_DEPARTMENT_OTHER): Payer: 59 | Admitting: Oncology

## 2021-10-03 ENCOUNTER — Other Ambulatory Visit: Payer: Self-pay

## 2021-10-03 VITALS — BP 116/50 | HR 68 | Temp 97.5°F | Resp 17 | Wt 116.5 lb

## 2021-10-03 DIAGNOSIS — C787 Secondary malignant neoplasm of liver and intrahepatic bile duct: Secondary | ICD-10-CM

## 2021-10-03 DIAGNOSIS — C50511 Malignant neoplasm of lower-outer quadrant of right female breast: Secondary | ICD-10-CM | POA: Diagnosis not present

## 2021-10-03 DIAGNOSIS — Z170 Estrogen receptor positive status [ER+]: Secondary | ICD-10-CM

## 2021-10-03 DIAGNOSIS — Z5112 Encounter for antineoplastic immunotherapy: Secondary | ICD-10-CM | POA: Diagnosis not present

## 2021-10-03 DIAGNOSIS — Z86711 Personal history of pulmonary embolism: Secondary | ICD-10-CM

## 2021-10-03 DIAGNOSIS — Z7189 Other specified counseling: Secondary | ICD-10-CM

## 2021-10-03 DIAGNOSIS — Z86718 Personal history of other venous thrombosis and embolism: Secondary | ICD-10-CM

## 2021-10-03 DIAGNOSIS — L930 Discoid lupus erythematosus: Secondary | ICD-10-CM

## 2021-10-03 LAB — CBC WITH DIFFERENTIAL/PLATELET
Abs Immature Granulocytes: 0.01 10*3/uL (ref 0.00–0.07)
Basophils Absolute: 0.1 10*3/uL (ref 0.0–0.1)
Basophils Relative: 2 %
Eosinophils Absolute: 0 10*3/uL (ref 0.0–0.5)
Eosinophils Relative: 1 %
HCT: 37.1 % (ref 36.0–46.0)
Hemoglobin: 12.4 g/dL (ref 12.0–15.0)
Immature Granulocytes: 0 %
Lymphocytes Relative: 39 %
Lymphs Abs: 1.2 10*3/uL (ref 0.7–4.0)
MCH: 33.2 pg (ref 26.0–34.0)
MCHC: 33.4 g/dL (ref 30.0–36.0)
MCV: 99.2 fL (ref 80.0–100.0)
Monocytes Absolute: 0.3 10*3/uL (ref 0.1–1.0)
Monocytes Relative: 9 %
Neutro Abs: 1.5 10*3/uL — ABNORMAL LOW (ref 1.7–7.7)
Neutrophils Relative %: 49 %
Platelets: 142 10*3/uL — ABNORMAL LOW (ref 150–400)
RBC: 3.74 MIL/uL — ABNORMAL LOW (ref 3.87–5.11)
RDW: 12.9 % (ref 11.5–15.5)
WBC: 3 10*3/uL — ABNORMAL LOW (ref 4.0–10.5)
nRBC: 0 % (ref 0.0–0.2)

## 2021-10-03 LAB — COMPREHENSIVE METABOLIC PANEL
ALT: 15 U/L (ref 0–44)
AST: 19 U/L (ref 15–41)
Albumin: 3.9 g/dL (ref 3.5–5.0)
Alkaline Phosphatase: 96 U/L (ref 38–126)
Anion gap: 7 (ref 5–15)
BUN: 24 mg/dL — ABNORMAL HIGH (ref 6–20)
CO2: 27 mmol/L (ref 22–32)
Calcium: 8.9 mg/dL (ref 8.9–10.3)
Chloride: 103 mmol/L (ref 98–111)
Creatinine, Ser: 0.92 mg/dL (ref 0.44–1.00)
GFR, Estimated: >60 mL/min (ref >60)
Glucose, Bld: 91 mg/dL (ref 70–99)
Potassium: 4.6 mmol/L (ref 3.5–5.1)
Sodium: 137 mmol/L (ref 135–145)
Total Bilirubin: 0.4 mg/dL (ref 0.3–1.2)
Total Protein: 6.7 g/dL (ref 6.5–8.1)

## 2021-10-03 LAB — PROTIME-INR
INR: 2 — ABNORMAL HIGH (ref 0.8–1.2)
Prothrombin Time: 22.7 seconds — ABNORMAL HIGH (ref 11.4–15.2)

## 2021-10-03 MED ORDER — HEPARIN SOD (PORK) LOCK FLUSH 100 UNIT/ML IV SOLN
500.0000 [IU] | Freq: Once | INTRAVENOUS | Status: AC | PRN
  Administered 2021-10-03: 500 [IU]

## 2021-10-03 MED ORDER — SODIUM CHLORIDE 0.9 % IV SOLN: Freq: Once | INTRAVENOUS | Status: AC

## 2021-10-03 MED ORDER — TRASTUZUMAB-ANNS CHEMO 150 MG IV SOLR
8.0000 mg/kg | Freq: Once | INTRAVENOUS | Status: AC
  Administered 2021-10-03: 420 mg via INTRAVENOUS
  Filled 2021-10-03: qty 20

## 2021-10-03 MED ORDER — DIPHENHYDRAMINE HCL 25 MG PO CAPS
25.0000 mg | ORAL_CAPSULE | Freq: Once | ORAL | Status: AC
  Administered 2021-10-03: 25 mg via ORAL
  Filled 2021-10-03: qty 1

## 2021-10-03 MED ORDER — ACETAMINOPHEN 325 MG PO TABS
650.0000 mg | ORAL_TABLET | Freq: Once | ORAL | Status: AC
  Administered 2021-10-03: 650 mg via ORAL
  Filled 2021-10-03: qty 2

## 2021-10-03 MED ORDER — SODIUM CHLORIDE 0.9% FLUSH
10.0000 mL | INTRAVENOUS | Status: DC | PRN
  Administered 2021-10-03: 10 mL

## 2021-10-04 LAB — CANCER ANTIGEN 27.29: CA 27.29: 62.6 U/mL — ABNORMAL HIGH (ref 0.0–38.6)

## 2021-10-08 NOTE — Progress Notes (Signed)
Office Visit Note  Patient: Erica Wood             Date of Birth: 01-06-1966           MRN: [id number]             PCP: Eunice Blase, MD Referring: Eunice Blase, MD Visit Date: 10/16/2021 Occupation: @GUAROCC @  Subjective:  Pain in multiple joints   History of Present Illness: Erica Wood is a 55 y.o. female with a history of systemic lupus and osteoarthritis.  She states she has been having increased pain and swelling in the last few months.  She complains of pain and swelling in her bilateral hands.  She is a still working full-time.  She takes Adderall in the morning to concentrate.  She states she has been having severe pain and discomfort in her neck.  She had MRI of her cervical spine in September.  She has discomfort in her knee joints and her feet.  She has not noticed any swelling in her knees or her feet.  Activities of Daily Living:  Patient reports morning stiffness for 5-10 minutes.   Patient Denies nocturnal pain.  Difficulty dressing/grooming: Reports Difficulty climbing stairs: Denies Difficulty getting out of chair: Reports Difficulty using hands for taps, buttons, cutlery, and/or writing: Reports  Review of Systems  Constitutional:  Positive for fatigue.  HENT:  Negative for mouth sores, mouth dryness and nose dryness.   Eyes:  Positive for dryness. Negative for pain and itching.  Respiratory:  Negative for shortness of breath and difficulty breathing.   Cardiovascular:  Negative for chest pain and palpitations.  Gastrointestinal:  Positive for constipation. Negative for blood in stool and diarrhea.  Endocrine: Negative for increased urination.  Genitourinary:  Negative for difficulty urinating.  Musculoskeletal:  Positive for joint pain, joint pain, joint swelling, myalgias, muscle tenderness and myalgias. Negative for morning stiffness.  Skin:  Positive for color change. Negative for rash, redness and sensitivity to sunlight.   Allergic/Immunologic: Negative for susceptible to infections.  Neurological:  Positive for numbness, headaches and weakness. Negative for dizziness and memory loss.  Hematological:  Positive for bruising/bleeding tendency. Negative for swollen glands.  Psychiatric/Behavioral:  Positive for sleep disturbance. Negative for confusion. The patient is nervous/anxious.    PMFS History:  Patient Active Problem List   Diagnosis Date Noted   Goals of care, counseling/discussion 05/09/2021   Liver metastases ([place]) 01/03/2021   Port-A-Cath in place 11/02/2020   Aortic atherosclerosis ([place]) 09/21/2020   Malignant neoplasm of lower-outer quadrant of right breast of female, estrogen receptor positive ([place]) 08/10/2020   Abnormal cervical Papanicolaou smear 07/12/2020   Pulmonary embolism ([place]) 07/12/2020   Fibrocystic disease of breast 01/22/2018   Primary osteoarthritis of both knees 05/02/2017   Raynaud's disease without gangrene 11/26/2016   Discoid lupus 11/26/2016   Anticardiolipin antibody positive 11/26/2016   Autoimmune disease ([place]) 11/25/2016   High risk medication use 11/25/2016   Primary osteoarthritis of both hands 11/25/2016   History of DVT (deep vein thrombosis) 11/25/2016   History of pulmonary embolism 11/25/2016   Primary insomnia 11/25/2016   Anxiety 11/25/2016   Irritable bowel syndrome with constipation 11/25/2016   Raynaud's phenomenon 01/03/2016   Varicose veins of lower extremities with other complications 99/24/2683   Spider veins of both lower extremities 05/24/2014    Past Medical History:  Diagnosis Date   Anti-cardiolipin antibody positive    Anti-cardiolipin antibody syndrome (HCC)    Anxiety    Arthralgia  Arthritis    bil knees, hands   Atypical chest pain    Autoimmune disease ([place])    Breast cancer metastasized to liver ([place]) 09/2020   Cancer ([place]) 07/2020   right breast IDC   Chronic fatigue    Depression    DVT (deep venous thrombosis) (HCC)     IBS (irritable bowel syndrome)    with constipation   Lupus (HCC)    Pulmonary embolus (HCC)    Raynaud's syndrome    Sicca (HCC)    Thrombocytopenia (HCC)     Family History  Problem Relation Age of Onset   Rheum arthritis Father    Hemachromatosis Father    Heart Problems Father    Cancer Sister        bone    Colon cancer Neg Hx    Stomach cancer Neg Hx    Esophageal cancer Neg Hx    Pancreatic cancer Neg Hx    Liver disease Neg Hx    Past Surgical History:  Procedure Laterality Date   ABLATION  12/2018   leg veins   ABLATION Right 08/2019   venous leg    APPENDECTOMY     COLON SURGERY     Perforated colon repair after colostomy   EYE SURGERY Bilateral 2022   GANGLION CYST EXCISION     HYSTEROSCOPY WITH D & C N/A 01/13/2014   Procedure: DILATATION AND CURETTAGE /HYSTEROSCOPY with resection ;  Surgeon: Olga Millers, MD;  Location: [facility];  Service: Gynecology;  Laterality: N/A;   LAPAROTOMY     PORTACATH PLACEMENT N/A 09/13/2020   Procedure: INSERTION PORT-A-CATH WITH ULTRASOUND GUIDANCE;  Surgeon: Rolm Bookbinder, MD;  Location: [facility];  Service: General;  Laterality: N/A;   Social History   Social History Narrative   Not on file   Immunization History  Administered Date(s) Administered   PFIZER Comirnaty([place])Covid-19 Tri-Sucrose Vaccine 08/11/2020     Objective: Vital Signs: BP 125/75 (BP Location: Left Arm, Patient Position: Sitting, Cuff Size: Normal)   Pulse 73   Ht 5\' 4"  (1.626 m)   Wt 116 lb (52.6 kg) Comment: recorded weight from 10/03/2021, pt would not weigh  BMI 19.91 kg/m    Physical Exam Vitals and nursing note reviewed.  Constitutional:      Appearance: She is well-developed.  HENT:     Head: Normocephalic and atraumatic.  Eyes:     Conjunctiva/sclera: Conjunctivae normal.  Cardiovascular:     Rate and Rhythm: Normal rate and regular rhythm.     Heart sounds: Normal heart sounds.  Pulmonary:      Effort: Pulmonary effort is normal.     Breath sounds: Normal breath sounds.  Abdominal:     General: Bowel sounds are normal.     Palpations: Abdomen is soft.  Musculoskeletal:     Cervical back: Normal range of motion.  Lymphadenopathy:     Cervical: No cervical adenopathy.  Skin:    General: Skin is warm and dry.     Capillary Refill: Capillary refill takes less than 2 seconds.  Neurological:     Mental Status: She is alert and oriented to person, place, and time.  Psychiatric:        Behavior: Behavior normal.     Musculoskeletal Exam: She had painful motion of her cervical spine especially with the right lateral rotation.  Shoulder joints and elbow joints with good range of motion.  She had no synovitis of her wrist joints.  She  had tenderness on palpation of her right CMC joint.  She had synovitis in her MCPs and PIP joints.  Hip joints and knee joints with good range of motion without any warmth swelling or effusion.  She had tenderness across MTPs but no synovitis was noted.  There was no synovitis of her ankle joints.  CDAI Exam: CDAI Score: 21.2  Patient Global: 6 mm; Provider Global: 6 mm Swollen: 9 ; Tender: 12  Joint Exam 10/16/2021      Right  Left  MCP 2  Swollen Tender  Swollen Tender  MCP 3  Swollen Tender  Swollen Tender  PIP 2  Swollen Tender  Swollen Tender  PIP 3  Swollen Tender  Swollen Tender  PIP 4  Swollen Tender     Cervical Spine   Tender     Knee   Tender   Tender     Investigation: No additional findings.  Imaging: No results found.  Recent Labs: Lab Results  Component Value Date   WBC 3.0 (L) 10/03/2021   HGB 12.4 10/03/2021   PLT 142 (L) 10/03/2021   NA 137 10/03/2021   K 4.6 10/03/2021   CL 103 10/03/2021   CO2 27 10/03/2021   GLUCOSE 91 10/03/2021   BUN 24 (H) 10/03/2021   CREATININE 0.92 10/03/2021   BILITOT 0.4 10/03/2021   ALKPHOS 96 10/03/2021   AST 19 10/03/2021   ALT 15 10/03/2021   PROT 6.7 10/03/2021   ALBUMIN 3.9  10/03/2021   CALCIUM 8.9 10/03/2021   GFRAA 95 07/28/2020    Speciality Comments: PLQ Eye Exam: 01/19/2021 WNL @ [facility] follow up in 1 year. stage IB invasive ductal carcinoma, grade 3 08/16/20  Procedures:  No procedures performed Allergies: Beef-derived products, Latex, and Sulfa antibiotics   Assessment / Plan:     Visit Diagnoses: Other organ or system involvement in systemic lupus erythematosus ([place]) - Positive ANA and positive double-stranded DNA, hypocomplementemia, inflammatory arthritis, raynauds phenomenon and discoid lupus: Her disease was fairly well controlled on hydroxychloroquine in the past.  She denies any history of oral ulcers, nasal ulcers, malar rash, photosensitivity or lymphadenopathy.  She complains of Raynaud's phenomenon.  She has been having increased pain and swelling in her joints.  I am uncertain if it is due to underlying autoimmune disease flare or immunotherapy.  She is receiving immunotherapy.  We had detailed discussion regarding different treatment options at the last visit.  She is concerned about side effects of most of the medications.  I am hesitant to use methotrexate due to neutropenia and thrombocytopenia.  Imuran could be another option.  Which is not very effective for inflammatory arthritis.  I also discussed possible use of Orencia if the serology indicates lupus flare.  Patient would like to read more about these medications and is not interested in adding anything at this point.  Chronic inflammatory arthritis - RF negative: She had pain and swelling in multiple joints as described above.  High risk medication use - Plaquenil 200 mg 1 tablet by mouth daily.  PLQ Eye Exam: 01/19/2021.   Discoid lupus-no active lesions.  Anticardiolipin antibody positive-anticardiolipin antibodies were positive initially but the became negative on repeat testing.  Malignant neoplasm of lower-outer quadrant of right breast of female, estrogen  receptor positive ([place]) - She was diagnosed with triple positive stage IV breast cancer and has been following along closely with Dr. Jana Hakim.  Patient will be switching to Dr.Gudena as Dr. Jana Hakim is retiring.  Raynaud's disease  without gangrene-she continues to have Raynauds symptoms.  Primary osteoarthritis of both hands-she has CMC, PIP and DIP thickening.  She has been having some discomfort in her right CMC joint.  Primary osteoarthritis of both knees - S/p visco in the past.  She complains of discomfort in her bilateral knee joints.  No warmth swelling or effusion was noted.  Primary insomnia-  Fatigue-she takes Adderall for ADD and fatigue.  She is a still working full-time.  I discussed that stopping Adderall if she does not go to work.  Vitamin D deficiency-vitamin D level was normal at 79.9 on June 01, 2021.  Other osteoporosis without current pathological fracture - DEXA updated on 05/10/2021: AP spine BMD 0.761 with T score -2.6.  She has an appointment on 08/02/21 with oncology to discuss DEXA results and treatment options.  History of DVT (deep vein thrombosis) - She is on warfarin.    History of pulmonary embolism - She is on warfarin.    History of IBS  History of anxiety  Family history of hemochromatosis  Neck pain - Scheduled for an MRI of the C-spine on 08/09/2021 to rule out spinal lesion secondary to metastatic breast cancer..  Orders: No orders of the defined types were placed in this encounter.  No orders of the defined types were placed in this encounter.    Follow-Up Instructions: Return in about 2 months (around 12/17/2021) for Systemic lupus.   Bo Merino, MD  Note - This record has been created using Editor, commissioning.  Chart creation errors have been sought, but may not always  have been located. Such creation errors do not reflect on  the standard of medical care.

## 2021-10-09 ENCOUNTER — Other Ambulatory Visit: Payer: Self-pay

## 2021-10-09 MED ORDER — AMPHETAMINE-DEXTROAMPHET ER 20 MG PO CP24: 20.0000 mg | ORAL_CAPSULE | Freq: Every day | ORAL | 0 refills | Status: DC

## 2021-10-09 NOTE — Telephone Encounter (Signed)
Patient called requesting prescription refill of Adderall to be sent to [facility] at [address].   

## 2021-10-09 NOTE — Telephone Encounter (Signed)
Next Visit: 10/16/2021   Last Visit: 07/31/2021   Last Fill: 09/10/2021   Current Dose per office note on 07/31/2021: not discussed   Okay to refill Adderall?

## 2021-10-16 ENCOUNTER — Other Ambulatory Visit: Payer: Self-pay

## 2021-10-16 ENCOUNTER — Ambulatory Visit (INDEPENDENT_AMBULATORY_CARE_PROVIDER_SITE_OTHER): Payer: 59 | Admitting: Rheumatology

## 2021-10-16 ENCOUNTER — Encounter: Payer: Self-pay | Admitting: Rheumatology

## 2021-10-16 VITALS — BP 125/75 | HR 73 | Ht 64.0 in | Wt 116.0 lb

## 2021-10-16 DIAGNOSIS — M3219 Other organ or system involvement in systemic lupus erythematosus: Secondary | ICD-10-CM

## 2021-10-16 DIAGNOSIS — Z8719 Personal history of other diseases of the digestive system: Secondary | ICD-10-CM

## 2021-10-16 DIAGNOSIS — Z8659 Personal history of other mental and behavioral disorders: Secondary | ICD-10-CM

## 2021-10-16 DIAGNOSIS — Z86711 Personal history of pulmonary embolism: Secondary | ICD-10-CM

## 2021-10-16 DIAGNOSIS — F5101 Primary insomnia: Secondary | ICD-10-CM

## 2021-10-16 DIAGNOSIS — M1990 Unspecified osteoarthritis, unspecified site: Secondary | ICD-10-CM

## 2021-10-16 DIAGNOSIS — M19042 Primary osteoarthritis, left hand: Secondary | ICD-10-CM

## 2021-10-16 DIAGNOSIS — L930 Discoid lupus erythematosus: Secondary | ICD-10-CM

## 2021-10-16 DIAGNOSIS — Z79899 Other long term (current) drug therapy: Secondary | ICD-10-CM

## 2021-10-16 DIAGNOSIS — C50511 Malignant neoplasm of lower-outer quadrant of right female breast: Secondary | ICD-10-CM

## 2021-10-16 DIAGNOSIS — R5383 Other fatigue: Secondary | ICD-10-CM

## 2021-10-16 DIAGNOSIS — R760 Raised antibody titer: Secondary | ICD-10-CM

## 2021-10-16 DIAGNOSIS — I7300 Raynaud's syndrome without gangrene: Secondary | ICD-10-CM

## 2021-10-16 DIAGNOSIS — M170 Bilateral primary osteoarthritis of knee: Secondary | ICD-10-CM

## 2021-10-16 DIAGNOSIS — Z86718 Personal history of other venous thrombosis and embolism: Secondary | ICD-10-CM

## 2021-10-16 DIAGNOSIS — E559 Vitamin D deficiency, unspecified: Secondary | ICD-10-CM

## 2021-10-16 DIAGNOSIS — Z8349 Family history of other endocrine, nutritional and metabolic diseases: Secondary | ICD-10-CM

## 2021-10-16 DIAGNOSIS — M542 Cervicalgia: Secondary | ICD-10-CM

## 2021-10-16 DIAGNOSIS — M19041 Primary osteoarthritis, right hand: Secondary | ICD-10-CM

## 2021-10-16 DIAGNOSIS — M359 Systemic involvement of connective tissue, unspecified: Secondary | ICD-10-CM

## 2021-10-16 DIAGNOSIS — M818 Other osteoporosis without current pathological fracture: Secondary | ICD-10-CM

## 2021-10-16 DIAGNOSIS — Z170 Estrogen receptor positive status [ER+]: Secondary | ICD-10-CM

## 2021-10-16 NOTE — Patient Instructions (Signed)
Etanercept Injection What is this medication? ETANERCEPT (et a NER sept) treats autoimmune conditions, such as psoriasis and certain types of arthritis. It works by slowing down an overactive immune system. It belongs to a group of medications called TNF inhibitors. This medicine may be used for other purposes; ask your health care provider or pharmacist if you have questions. COMMON BRAND NAME(S): Enbrel What should I tell my care team before I take this medication? They need to know if you have any of these conditions: Bleeding disorder Cancer Diabetes Granulomatosis with polyangiitis Heart failure HIV or AIDS Immune system problems Infection such as tuberculosis (TB) or other bacterial, fungal or viral infections Liver disease Nervous system problems such as Guillain-Barre syndrome, multiple sclerosis or seizures Recent or upcoming vaccine An unusual or allergic reaction to etanercept, other medications, latex, rubber, food, dyes, or preservatives Pregnant or trying to get pregnant Breast-feeding How should I use this medication? The medication is injected under the skin. You will be taught how to prepare and give it. Take it as directed on the prescription label. Keep taking it unless your care team tells you stop. This medication comes with INSTRUCTIONS FOR USE. Ask your pharmacist for directions on how to use this medication. Read the information carefully. Talk to your pharmacist or care team if you have questions. If you use a pen, be sure to take off the outer needle cover before using the dose. It is important that you put your used needles and syringes in a special sharps container. Do not put them in a trash can. If you do not have a sharps container, call your pharmacist or care team to get one. A special MedGuide will be given to you by the pharmacist with each prescription and refill. Be sure to read this information carefully each time. Talk to your care team about the use  of this medication in children. While it may be prescribed for children as young as 2 years of age for selected conditions, precautions do apply. Overdosage: If you think you have taken too much of this medicine contact a poison control center or emergency room at once. NOTE: This medicine is only for you. Do not share this medicine with others. What if I miss a dose? If you miss a dose, take it as soon as you can. If it is almost time for your next dose, take only that dose. Do not take double or extra doses. What may interact with this medication? Do not take this medication with any of the following: Biologic medications such as adalimumab, certolizumab, golimumab, infliximab Live vaccines Rilonacept This medication may also interact with the following: Abatacept [place] Biologic medications such as anifrolumab, baricitinib, belimumab, canakinumab, natalizumab, rituximab, sarilumab, tocilizumab, tofacitinib, upadacitinib, vedolizumab Cyclophosphamide Sulfasalazine This list may not describe all possible interactions. Give your health care provider a list of all the medicines, herbs, non-prescription drugs, or dietary supplements you use. Also tell them if you smoke, drink alcohol, or use illegal drugs. Some items may interact with your medicine. What should I watch for while using this medication? Visit your care team for regular checks on your progress. Tell your care team if your symptoms do not start to get better or if they get worse. This medication may increase your risk of getting an infection. Call your care team for advice if you get a fever, chills, sore throat, or other symptoms of a cold or flu. Do not treat yourself. Try to avoid being around people who are   sick. If you have not had the measles or chickenpox vaccines, tell your care team right away if you are around someone with these viruses. You will be tested for tuberculosis (TB) before you start this medication. If your care  team prescribes any medication for TB, you should start taking the TB medication before starting this medication. Make sure to finish the full course of TB medication. Avoid taking medications that contain aspirin, acetaminophen, ibuprofen, naproxen, or ketoprofen unless instructed by your care team. These medications may hide fever. Talk to your care team about your risk of cancer. You may be more at risk for certain types of cancer if you take this medication. This medication can decrease the response to a vaccine. If you need to get vaccinated, tell your care team if you have received this medication. Extra booster doses may be needed. Talk to your care team to see if a different vaccination schedule is needed. What side effects may I notice from receiving this medication? Side effects that you should report to your care team as soon as possible: Allergic reactions--skin rash, itching, hives, swelling of the face, lips, tongue, or throat Body pain, tingling, or numbness Eye pain, change in vision, vision loss Heart failure--shortness of breath, swelling of the ankles, feet, or hands, sudden weight gain, unusual weakness or fatigue Infection--fever, chills, cough, sore throat, wounds that don't heal, pain or trouble when passing urine, general feeling of discomfort or being unwell Liver injury--right upper belly pain, loss of appetite, nausea, light-colored stool, dark yellow or brown urine, yellowing skin or eyes, unusual weakness or fatigue Low red blood cell level--unusual weakness or fatigue, dizziness, headache, trouble breathing Lupus-like syndrome--joint pain, swelling, or stiffness, butterfly-shaped rash on the face, rashes that get worse in the sun, fever, unusual weakness or fatigue New or worsening psoriasis--rash with itchy, scaly patches Seizures Unusual bruising or bleeding Weakness in arms and legs Side effects that usually do not require medical attention (report to your care team  if they continue or are bothersome): Headache Pain, redness, or irritation at injection site Sinus pain or pressure around the face or forehead This list may not describe all possible side effects. Call your doctor for medical advice about side effects. You may report side effects to FDA at 1-800-FDA-1088. Where should I keep my medication? Keep out of the reach of children and pets. See product for storage information. Each product may have different instructions. Get rid of any unused medication after the expiration date. To get rid of medications that are no longer needed or have expired: Take the medication to a medication take-back program. Check with your pharmacy or law enforcement to find a location. If you cannot return the medication, ask your pharmacist or care team how to get rid of this medication safely. NOTE: This sheet is a summary. It may not cover all possible information. If you have questions about this medicine, talk to your doctor, pharmacist, or health care provider.  2022 Elsevier/Gold Standard (2021-06-21 00:00:00)

## 2021-10-16 NOTE — Addendum Note (Signed)
Addended by: Earnestine Mealing on: 10/16/2021 02:50 PM   Modules accepted: Orders

## 2021-10-30 ENCOUNTER — Other Ambulatory Visit: Payer: Self-pay

## 2021-10-30 DIAGNOSIS — Z170 Estrogen receptor positive status [ER+]: Secondary | ICD-10-CM

## 2021-10-31 ENCOUNTER — Inpatient Hospital Stay: Payer: 59

## 2021-10-31 ENCOUNTER — Inpatient Hospital Stay: Payer: 59 | Attending: Oncology

## 2021-10-31 ENCOUNTER — Other Ambulatory Visit: Payer: Self-pay

## 2021-10-31 VITALS — BP 118/53 | HR 75 | Temp 98.2°F | Resp 17 | Wt 117.5 lb

## 2021-10-31 DIAGNOSIS — Z5112 Encounter for antineoplastic immunotherapy: Secondary | ICD-10-CM | POA: Insufficient documentation

## 2021-10-31 DIAGNOSIS — Z7901 Long term (current) use of anticoagulants: Secondary | ICD-10-CM | POA: Diagnosis not present

## 2021-10-31 DIAGNOSIS — Z86711 Personal history of pulmonary embolism: Secondary | ICD-10-CM | POA: Diagnosis not present

## 2021-10-31 DIAGNOSIS — Z86718 Personal history of other venous thrombosis and embolism: Secondary | ICD-10-CM | POA: Diagnosis not present

## 2021-10-31 DIAGNOSIS — C787 Secondary malignant neoplasm of liver and intrahepatic bile duct: Secondary | ICD-10-CM | POA: Diagnosis present

## 2021-10-31 DIAGNOSIS — C50511 Malignant neoplasm of lower-outer quadrant of right female breast: Secondary | ICD-10-CM | POA: Diagnosis present

## 2021-10-31 DIAGNOSIS — Z170 Estrogen receptor positive status [ER+]: Secondary | ICD-10-CM | POA: Diagnosis not present

## 2021-10-31 LAB — CMP (CANCER CENTER ONLY)
ALT: 17 U/L (ref 0–44)
AST: 21 U/L (ref 15–41)
Albumin: 4.4 g/dL (ref 3.5–5.0)
Alkaline Phosphatase: 102 U/L (ref 38–126)
Anion gap: 6 (ref 5–15)
BUN: 14 mg/dL (ref 6–20)
CO2: 29 mmol/L (ref 22–32)
Calcium: 9.4 mg/dL (ref 8.9–10.3)
Chloride: 102 mmol/L (ref 98–111)
Creatinine: 1.07 mg/dL — ABNORMAL HIGH (ref 0.44–1.00)
GFR, Estimated: >60 mL/min (ref >60)
Glucose, Bld: 67 mg/dL — ABNORMAL LOW (ref 70–99)
Potassium: 4 mmol/L (ref 3.5–5.1)
Sodium: 137 mmol/L (ref 135–145)
Total Bilirubin: 0.4 mg/dL (ref 0.3–1.2)
Total Protein: 7 g/dL (ref 6.5–8.1)

## 2021-10-31 LAB — CBC WITH DIFFERENTIAL (CANCER CENTER ONLY)
Abs Immature Granulocytes: 0.01 10*3/uL (ref 0.00–0.07)
Basophils Absolute: 0.1 10*3/uL (ref 0.0–0.1)
Basophils Relative: 3 %
Eosinophils Absolute: 0 10*3/uL (ref 0.0–0.5)
Eosinophils Relative: 1 %
HCT: 40.1 % (ref 36.0–46.0)
Hemoglobin: 13.2 g/dL (ref 12.0–15.0)
Immature Granulocytes: 0 %
Lymphocytes Relative: 47 %
Lymphs Abs: 1.1 10*3/uL (ref 0.7–4.0)
MCH: 32.8 pg (ref 26.0–34.0)
MCHC: 32.9 g/dL (ref 30.0–36.0)
MCV: 99.5 fL (ref 80.0–100.0)
Monocytes Absolute: 0.2 10*3/uL (ref 0.1–1.0)
Monocytes Relative: 7 %
Neutro Abs: 1 10*3/uL — ABNORMAL LOW (ref 1.7–7.7)
Neutrophils Relative %: 42 %
Platelet Count: 171 10*3/uL (ref 150–400)
RBC: 4.03 MIL/uL (ref 3.87–5.11)
RDW: 12.5 % (ref 11.5–15.5)
Smear Review: NORMAL
WBC Count: 2.4 10*3/uL — ABNORMAL LOW (ref 4.0–10.5)
nRBC: 0 % (ref 0.0–0.2)

## 2021-10-31 LAB — PROTIME-INR
INR: 2 — ABNORMAL HIGH (ref 0.8–1.2)
Prothrombin Time: 22.5 seconds — ABNORMAL HIGH (ref 11.4–15.2)

## 2021-10-31 MED ORDER — SODIUM CHLORIDE 0.9 % IV SOLN: Freq: Once | INTRAVENOUS | Status: AC

## 2021-10-31 MED ORDER — TRASTUZUMAB-ANNS CHEMO 150 MG IV SOLR
8.0000 mg/kg | Freq: Once | INTRAVENOUS | Status: AC
  Administered 2021-10-31: 11:00:00 420 mg via INTRAVENOUS
  Filled 2021-10-31: qty 20

## 2021-10-31 MED ORDER — DIPHENHYDRAMINE HCL 25 MG PO CAPS
25.0000 mg | ORAL_CAPSULE | Freq: Once | ORAL | Status: AC
  Administered 2021-10-31: 11:00:00 25 mg via ORAL
  Filled 2021-10-31: qty 1

## 2021-10-31 MED ORDER — ACETAMINOPHEN 325 MG PO TABS
650.0000 mg | ORAL_TABLET | Freq: Once | ORAL | Status: AC
  Administered 2021-10-31: 11:00:00 650 mg via ORAL
  Filled 2021-10-31: qty 2

## 2021-10-31 MED ORDER — SODIUM CHLORIDE 0.9% FLUSH
10.0000 mL | INTRAVENOUS | Status: DC | PRN
  Administered 2021-10-31: 12:00:00 10 mL

## 2021-10-31 MED ORDER — HEPARIN SOD (PORK) LOCK FLUSH 100 UNIT/ML IV SOLN
500.0000 [IU] | Freq: Once | INTRAVENOUS | Status: AC | PRN
  Administered 2021-10-31: 12:00:00 500 [IU]

## 2021-10-31 NOTE — Progress Notes (Signed)
During initial assessment in infusion today the Pt informed RN that she would like to pause treatments moving forward after today's treatment. She stated that her rheumatologist informed her that the immunotherapy could be causing her immune system flairs that are causing her joints, legs, and hands to swell and affecting her lupus. She also informed the RN that her fatigue has gotten worse and she just feels tired all of the time and it is beginning to affect her ability to work and she has to work. She also informed the RN that she saw her PCP last week for a fungal throat infection and they prescribed her fluconazole and it has since cleared up.  RN informed the collab RN and MD.  Ma Rings RN will f/u w/ Pt regarding her upcoming treatments.   Pt's ANC was 1.0 today, per MD OK to trt.

## 2021-10-31 NOTE — Patient Instructions (Signed)
[facility]  Discharge Instructions: °Thank you for choosing [facility] to provide your oncology and hematology care.  ° °If you have a lab appointment with the Cancer Center, please go directly to the Cancer Center and check in at the registration area. °  °Wear comfortable clothing and clothing appropriate for easy access to any Portacath or PICC line.  ° °We strive to give you quality time with your provider. You may need to reschedule your appointment if you arrive late (15 or more minutes).  Arriving late affects you and other patients whose appointments are after yours.  Also, if you miss three or more appointments without notifying the office, you may be dismissed from the clinic at the provider’s discretion.    °  °For prescription refill requests, have your pharmacy contact our office and allow 72 hours for refills to be completed.   ° °Today you received the following chemotherapy and/or immunotherapy agents: Kanjinti °  °To help prevent nausea and vomiting after your treatment, we encourage you to take your nausea medication as directed. ° °BELOW ARE SYMPTOMS THAT SHOULD BE REPORTED IMMEDIATELY: °*FEVER GREATER THAN 100.4 F (38 °C) OR HIGHER °*CHILLS OR SWEATING °*NAUSEA AND VOMITING THAT IS NOT CONTROLLED WITH YOUR NAUSEA MEDICATION °*UNUSUAL SHORTNESS OF BREATH °*UNUSUAL BRUISING OR BLEEDING °*URINARY PROBLEMS (pain or burning when urinating, or frequent urination) °*BOWEL PROBLEMS (unusual diarrhea, constipation, pain near the anus) °TENDERNESS IN MOUTH AND THROAT WITH OR WITHOUT PRESENCE OF ULCERS (sore throat, sores in mouth, or a toothache) °UNUSUAL RASH, SWELLING OR PAIN  °UNUSUAL VAGINAL DISCHARGE OR ITCHING  ° °Items with * indicate a potential emergency and should be followed up as soon as possible or go to the Emergency Department if any problems should occur. ° °Please show the CHEMOTHERAPY ALERT CARD or IMMUNOTHERAPY ALERT CARD at check-in to the  Emergency Department and triage nurse. ° °Should you have questions after your visit or need to cancel or reschedule your appointment, please contact [facility]  Dept: [phone]  and follow the prompts.  Office hours are 8:00 a.m. to 4:30 p.m. Monday - Friday. Please note that voicemails left after 4:00 p.m. may not be returned until the following business day.  We are closed weekends and major holidays. You have access to a nurse at all times for urgent questions. Please call the main number to the clinic Dept: [phone] and follow the prompts. ° ° °For any non-urgent questions, you may also contact your provider using MyChart. We now offer e-Visits for anyone 18 and older to request care online for non-urgent symptoms. For details visit [email or web address]. °  °Also download the MyChart app! Go to the app store, search "MyChart", open the app, select [facility], and log in with your MyChart username and password. ° °Due to Covid, a mask is required upon entering the hospital/clinic. If you do not have a mask, one will be given to you upon arrival. For doctor visits, patients may have 1 support person aged 18 or older with them. For treatment visits, patients cannot have anyone with them due to current Covid guidelines and our immunocompromised population.  ° °

## 2021-11-01 LAB — CANCER ANTIGEN 27.29: CA 27.29: 89.4 U/mL — ABNORMAL HIGH (ref 0.0–38.6)

## 2021-11-08 ENCOUNTER — Other Ambulatory Visit: Payer: Self-pay | Admitting: Rheumatology

## 2021-11-08 MED ORDER — AMPHETAMINE-DEXTROAMPHET ER 20 MG PO CP24: 20.0000 mg | ORAL_CAPSULE | Freq: Every day | ORAL | 0 refills | Status: DC

## 2021-11-08 NOTE — Telephone Encounter (Signed)
Patient called the office requesting a refill of Adderall 20 mg to be sent [facility] on [address].

## 2021-11-08 NOTE — Telephone Encounter (Signed)
Next Visit: 12/19/2021  Last Visit: 10/16/2021  Last Fill: 10/09/2021  Dx: Fatigue  Current Dose per office note on 10/16/2021: not discussed  Okay to refill Adderall?

## 2021-11-19 ENCOUNTER — Telehealth: Payer: Self-pay | Admitting: *Deleted

## 2021-11-19 NOTE — Telephone Encounter (Signed)
This RN returned call to pt per her VM stating she tested positive for Covd today.  She states symptoms are severe headace and malaise, mild cough.  She is being put on an antiviral by her primary MD. She has not contacted her rheumatologist .  She verified current use of Ibrance is d1 of 21 day cycle - she has taken her tablet for today.  Per discussion and review with MD - pt should hold on Ibrance therapy.  May use OTC medications with antiviral for symptom management and try to be aggressive in her hydration.  This RN again recommended she contact her rheumatologist to inform of Covid status due to her lupus and osteoarthritis.  This RN asked for pt to call next week to discuss her symptoms and when to restart current cycle.  Erica Wood verbalized understanding.

## 2021-11-19 NOTE — Telephone Encounter (Signed)
Received call from patient stating she was just diagnosed with COVID today and feels terrible.  She was asking about her appts next week.  Informed her we would cancel her appts for next week and that one of our schedulers will call her with a new appt. Schedule message sent.

## 2021-11-20 ENCOUNTER — Ambulatory Visit (HOSPITAL_COMMUNITY): Payer: 59

## 2021-11-20 ENCOUNTER — Ambulatory Visit (HOSPITAL_COMMUNITY): Payer: 59 | Admitting: Cardiology

## 2021-11-26 ENCOUNTER — Encounter: Payer: Self-pay | Admitting: *Deleted

## 2021-11-28 ENCOUNTER — Other Ambulatory Visit: Payer: 59

## 2021-11-28 ENCOUNTER — Ambulatory Visit: Payer: 59 | Admitting: Hematology and Oncology

## 2021-11-28 ENCOUNTER — Ambulatory Visit: Payer: 59

## 2021-12-05 ENCOUNTER — Other Ambulatory Visit: Payer: Self-pay | Admitting: Oncology

## 2021-12-05 ENCOUNTER — Other Ambulatory Visit: Payer: Self-pay | Admitting: Hematology and Oncology

## 2021-12-06 NOTE — Progress Notes (Signed)
Office Visit Note  Patient: Erica Wood             Date of Birth: 03/12/1966           MRN: [id number]             PCP: Eunice Blase, MD Referring: Eunice Blase, MD Visit Date: 12/19/2021 Occupation: _0 @  Subjective:  Discuss medications   History of Present Illness: Erica Wood is a 56 y.o. female with history of systemic lupus erythematosus, discoid lupus, and chronic inflammatory Tritus.  Patient is currently taking Plaquenil 200 mg 1 tablet by mouth twice daily Monday through Friday.  She continues to tolerate Plaquenil without any side effects.  She states that her last immunotherapy infusion was scheduled on 12/01/2020.  Her next infusion is scheduled for tomorrow.  She states that with the gap in therapy she has noticed less joint pain and inflammation.  She will be establishing care with Dr. Lindi Adie tomorrow and plans on discussing initiating imuran vs. Orencia at that time.  She is apprehensive to add on a new medication to treat a side effect of immunotherapy.  She continues to experience intermittent pain and swelling in both hands and both knee joints. Patient reports that she was diagnosed with COVID-19 on 11/19/2021.  Her symptoms were severe but have started to improve.  She continues to experience fatigue on a daily basis. Her Plaquenil eye examination is scheduled in May 2023.   Activities of Daily Living:  Patient reports morning stiffness for 15 minutes.   Patient Denies nocturnal pain.  Difficulty dressing/grooming: Denies Difficulty climbing stairs: Reports Difficulty getting out of chair: Denies Difficulty using hands for taps, buttons, cutlery, and/or writing: Reports  Review of Systems  Constitutional:  Positive for fatigue.  HENT:  Negative for mouth sores, mouth dryness and nose dryness.   Eyes:  Negative for pain, itching and dryness.  Respiratory:  Negative for shortness of breath and difficulty breathing.   Cardiovascular:   Negative for chest pain and palpitations.  Gastrointestinal:  Positive for constipation. Negative for blood in stool and diarrhea.  Endocrine: Negative for increased urination.  Genitourinary:  Negative for difficulty urinating.  Musculoskeletal:  Positive for joint pain, joint pain and morning stiffness. Negative for joint swelling, myalgias, muscle tenderness and myalgias.  Skin:  Positive for color change. Negative for rash and redness.  Allergic/Immunologic: Negative for susceptible to infections.  Neurological:  Positive for numbness and weakness. Negative for dizziness, headaches and memory loss.  Hematological:  Negative for bruising/bleeding tendency.  Psychiatric/Behavioral:  Negative for confusion.    PMFS History:  Patient Active Problem List   Diagnosis Date Noted   Goals of care, counseling/discussion 05/09/2021   Liver metastases ([place]) 01/03/2021   Port-A-Cath in place 11/02/2020   Aortic atherosclerosis ([place]) 09/21/2020   Malignant neoplasm of lower-outer quadrant of right breast of female, estrogen receptor positive ([place]) 08/10/2020   Abnormal cervical Papanicolaou smear 07/12/2020   Pulmonary embolism ([place]) 07/12/2020   Fibrocystic disease of breast 01/22/2018   Primary osteoarthritis of both knees 05/02/2017   Raynaud's disease without gangrene 11/26/2016   Discoid lupus 11/26/2016   Anticardiolipin antibody positive 11/26/2016   Autoimmune disease ([place]) 11/25/2016   High risk medication use 11/25/2016   Primary osteoarthritis of both hands 11/25/2016   History of DVT (deep vein thrombosis) 11/25/2016   History of pulmonary embolism 11/25/2016   Primary insomnia 11/25/2016   Anxiety 11/25/2016   Irritable bowel syndrome with constipation 11/25/2016  Raynaud's phenomenon 01/03/2016   Varicose veins of lower extremities with other complications 02/40/9735   Spider veins of both lower extremities 05/24/2014    Past Medical History:  Diagnosis Date    Anti-cardiolipin antibody positive    Anti-cardiolipin antibody syndrome (HCC)    Anxiety    Arthralgia    Arthritis    bil knees, hands   Atypical chest pain    Autoimmune disease ([place])    Breast cancer metastasized to liver ([place]) 09/2020   Cancer ([place]) 07/2020   right breast IDC   Chronic fatigue    Depression    DVT (deep venous thrombosis) (HCC)    IBS (irritable bowel syndrome)    with constipation   Lupus (HCC)    Pulmonary embolus (HCC)    Raynaud's syndrome    Sicca (HCC)    Thrombocytopenia (HCC)     Family History  Problem Relation Age of Onset   Rheum arthritis Father    Hemachromatosis Father    Heart Problems Father    Cancer Sister        bone    Colon cancer Neg Hx    Stomach cancer Neg Hx    Esophageal cancer Neg Hx    Pancreatic cancer Neg Hx    Liver disease Neg Hx    Past Surgical History:  Procedure Laterality Date   ABLATION  12/2018   leg veins   ABLATION Right 08/2019   venous leg    APPENDECTOMY     COLON SURGERY     Perforated colon repair after colostomy   EYE SURGERY Bilateral 2022   GANGLION CYST EXCISION     HYSTEROSCOPY WITH D & C N/A 01/13/2014   Procedure: DILATATION AND CURETTAGE /HYSTEROSCOPY with resection ;  Surgeon: Olga Millers, MD;  Location: [facility];  Service: Gynecology;  Laterality: N/A;   LAPAROTOMY     PORTACATH PLACEMENT N/A 09/13/2020   Procedure: INSERTION PORT-A-CATH WITH ULTRASOUND GUIDANCE;  Surgeon: Rolm Bookbinder, MD;  Location: [facility];  Service: General;  Laterality: N/A;   Social History   Social History Narrative   Not on file   Immunization History  Administered Date(s) Administered   PFIZER Comirnaty([place])Covid-19 Tri-Sucrose Vaccine 08/11/2020     Objective: Vital Signs: BP 129/81 (BP Location: Left Arm, Patient Position: Sitting, Cuff Size: Normal)    Pulse 74    Ht 5' 4" (1.626 m)    Wt 116 lb (52.6 kg)    BMI 19.91 kg/m    Physical Exam Vitals and nursing note  reviewed.  Constitutional:      Appearance: She is well-developed.  HENT:     Head: Normocephalic and atraumatic.  Eyes:     Conjunctiva/sclera: Conjunctivae normal.  Cardiovascular:     Rate and Rhythm: Normal rate and regular rhythm.     Heart sounds: Normal heart sounds.  Pulmonary:     Effort: Pulmonary effort is normal.     Breath sounds: Normal breath sounds.  Abdominal:     General: Bowel sounds are normal.     Palpations: Abdomen is soft.  Musculoskeletal:     Cervical back: Normal range of motion.  Skin:    General: Skin is warm and dry.     Capillary Refill: Capillary refill takes less than 2 seconds.  Neurological:     Mental Status: She is alert and oriented to person, place, and time.  Psychiatric:        Behavior: Behavior normal.  Musculoskeletal Exam: C-spine, thoracic spine, and lumbar spine good ROM.  Shoulder joints, elbow joints, and wrist joints have good ROM.  Synovial thickening of bilateral 2nd and 3rd MCP joints. PIP and DIP thickening consistent with OA of both hands.  CMC thickening bilaterally. Complete fist formation bilaterally. Hip joints have good ROM with no groin pain.  Painful ROM of the right knee. Pedal edema in both LE, left >right.  No tenderness over ankle joints.   CDAI Exam: CDAI Score: -- Patient Global: --; Provider Global: -- Swollen: --; Tender: -- Joint Exam 12/19/2021   No joint exam has been documented for this visit   There is currently no information documented on the homunculus. Go to the Rheumatology activity and complete the homunculus joint exam.  Investigation: No additional findings.  Imaging: ECHOCARDIOGRAM COMPLETE  Result Date: 12/12/2021    ECHOCARDIOGRAM REPORT   Patient Name:   VANISSA STRENGTH [facility] Date of Exam: 12/12/2021 Medical Rec #:  [id number]            Height:       64.0 in Accession #:    [id number]           Weight:       117.5 lb Date of Birth:  1966-04-11            BSA:          1.561 m Patient  Age:    80 years             BP:           108/67 mmHg Patient Gender: F                    HR:           64 bpm. Exam Location:  Outpatient Procedure: 2D Echo, 3D Echo, Cardiac Doppler, Color Doppler and Strain Analysis Indications:    Z51.11 Encounter for antineoplastic chemotheraphy  History:        Patient has prior history of Echocardiogram examinations, most                 recent 08/17/2021. PE. Metastatic cancer. Lupus.  Sonographer:    Roseanna Rainbow RDCS Referring Phys: [place]  1. Left ventricular ejection fraction by 3D volume is 65 %. The left ventricle has normal function. The left ventricle has no regional wall motion abnormalities. Left ventricular diastolic parameters were normal. The average left ventricular global longitudinal strain is -22.0 %. The global longitudinal strain is normal.  2. Right ventricular systolic function is normal. The right ventricular size is normal. There is normal pulmonary artery systolic pressure.  3. The mitral valve is normal in structure. No evidence of mitral valve regurgitation. No evidence of mitral stenosis.  4. The aortic valve is tricuspid. Aortic valve regurgitation is not visualized. No aortic stenosis is present.  5. The inferior vena cava is normal in size with greater than 50% respiratory variability, suggesting right atrial pressure of 3 mmHg. Comparison(s): A prior study was performed on 08/2021. No significant change from prior study. FINDINGS  Left Ventricle: Left ventricular ejection fraction by 3D volume is 65 %. The left ventricle has normal function. The left ventricle has no regional wall motion abnormalities. The average left ventricular global longitudinal strain is -22.0 %. The global  longitudinal strain is normal. The left ventricular internal cavity size was normal in size. There is no left ventricular hypertrophy. Left ventricular diastolic parameters were normal.  Right Ventricle: The right ventricular size is normal.  No increase in right ventricular wall thickness. Right ventricular systolic function is normal. There is normal pulmonary artery systolic pressure. The tricuspid regurgitant velocity is 2.34 m/s, and  with an assumed right atrial pressure of 3 mmHg, the estimated right ventricular systolic pressure is 06.3 mmHg. Left Atrium: Left atrial size was normal in size. Right Atrium: Right atrial size was normal in size. Pericardium: There is no evidence of pericardial effusion. Mitral Valve: The mitral valve is normal in structure. No evidence of mitral valve regurgitation. No evidence of mitral valve stenosis. Tricuspid Valve: The tricuspid valve is normal in structure. Tricuspid valve regurgitation is mild . No evidence of tricuspid stenosis. Aortic Valve: The aortic valve is tricuspid. Aortic valve regurgitation is not visualized. No aortic stenosis is present. Pulmonic Valve: The pulmonic valve was normal in structure. Pulmonic valve regurgitation is not visualized. No evidence of pulmonic stenosis. Aorta: The aortic root is normal in size and structure. Venous: The inferior vena cava is normal in size with greater than 50% respiratory variability, suggesting right atrial pressure of 3 mmHg. IAS/Shunts: No atrial level shunt detected by color flow Doppler.  LEFT VENTRICLE PLAX 2D LVIDd:         4.40 cm         Diastology LVIDs:         2.70 cm         LV e' medial:    10.00 cm/s LV PW:         0.70 cm         LV E/e' medial:  8.6 LV IVS:        0.60 cm         LV e' lateral:   12.50 cm/s LVOT diam:     1.80 cm         LV E/e' lateral: 6.9 LV SV:         63 LV SV Index:   41              2D LVOT Area:     2.54 cm        Longitudinal                                Strain                                2D Strain GLS  -22.0 % LV Volumes (MOD)               Avg: LV vol d, MOD    82.8 ml A2C:                           3D Volume EF LV vol d, MOD    78.9 ml       LV 3D EF:    Left A4C:                                         ventricul LV vol s, MOD    31.4 ml                    ar A2C:  ejection LV vol s, MOD    33.6 ml                    fraction A4C:                                        by 3D LV SV MOD A2C:   51.4 ml                    volume is LV SV MOD A4C:   78.9 ml                    65 %. LV SV MOD BP:    48.5 ml                                 3D Volume EF:                                3D EF:        65 %                                LV EDV:       77 ml                                LV ESV:       27 ml                                LV SV:        50 ml RIGHT VENTRICLE             IVC RV S prime:     11.10 cm/s  IVC diam: 2.20 cm TAPSE (M-mode): 2.6 cm LEFT ATRIUM             Index        RIGHT ATRIUM           Index LA diam:        2.80 cm 1.79 cm/m   RA Area:     15.40 cm LA Vol (A2C):   32.2 ml 20.63 ml/m  RA Volume:   40.70 ml  26.08 ml/m LA Vol (A4C):   19.8 ml 12.69 ml/m LA Biplane Vol: 25.5 ml 16.34 ml/m  AORTIC VALVE             PULMONIC VALVE LVOT Vmax:   117.00 cm/s PR End Diast Vel: 1.82 msec LVOT Vmean:  77.100 cm/s LVOT VTI:    0.249 m  AORTA Ao Root diam: 3.10 cm Ao Asc diam:  3.20 cm MITRAL VALVE               TRICUSPID VALVE MV Area (PHT): 3.60 cm    TR Peak grad:   21.9 mmHg MV Decel Time: 211 msec    TR Vmax:        234.00 cm/s MV E velocity: 86.10 cm/s MV A velocity: 49.00 cm/s  SHUNTS MV E/A ratio:  1.76        Systemic VTI:  0.25  m                            Systemic Diam: 1.80 cm Godfrey Pick Tobb DO Electronically signed by Berniece Salines DO Signature Date/Time: 12/12/2021/10:29:05 AM    Final     Recent Labs: Lab Results  Component Value Date   WBC 2.4 (L) 10/31/2021   HGB 13.2 10/31/2021   PLT 171 10/31/2021   NA 137 10/31/2021   K 4.0 10/31/2021   CL 102 10/31/2021   CO2 29 10/31/2021   GLUCOSE 67 (L) 10/31/2021   BUN 14 10/31/2021   CREATININE 1.07 (H) 10/31/2021   BILITOT 0.4 10/31/2021   ALKPHOS 102 10/31/2021   AST 21 10/31/2021   ALT 17  10/31/2021   PROT 7.0 10/31/2021   ALBUMIN 4.4 10/31/2021   CALCIUM 9.4 10/31/2021   GFRAA 95 07/28/2020    Speciality Comments: PLQ Eye Exam: 01/19/2021 WNL @ [facility] follow up in 1 year. stage IB invasive ductal carcinoma, grade 3 08/16/20  Procedures:  No procedures performed Allergies: Beef-derived products, Latex, and Sulfa antibiotics   Assessment / Plan:     Visit Diagnoses: SLE (systemic lupus erythematosus related syndrome) (HCC) - Positive ANA and positive double-stranded DNA, hypocomplementemia, inflammatory arthritis, raynauds phenomenon and discoid lupus: She remains on Plaquenil 200 mg 1 tablet by mouth daily.  She is tolerating Plaquenil without any side effects.  She is not currently exhibiting any signs or symptoms of a lupus flare.  No discoid lesions at this time.  She remains on immunotherapy for management of breast cancer.  She will be transitioning care from Dr. Jana Hakim (retired) to Dr. Lindi Adie.  Her next immunotherapy infusion is scheduled tomorrow along with a routine follow-up with Dr. Lindi Adie. Her last immunotherapy infusion was scheduled on 10/31/2021.  She had a gap in therapy since she was diagnosed with COVID-19 on 11/19/2021.   She continues to experience intermittent pain and inflammation involving both hands and both knee joints.  She had an MRI of the right knee on 11/14/2021 which revealed a small joint effusion but no meniscal or ligament abnormalities were noted.  In the past it was unclear if the inflammation was due to underlying autoimmune disease or immunotherapy side effect.  It seems as though her joint pain has improved since having a short gap in immunotherapy infusions.  She has not had updated autoimmune lab work since prior to her last follow-up visit to monitor for a flare.  Orders were released today.   She plans on discussing initiating Orencia versus Imuran for management of chronic inflammatory arthritis with Dr. Lindi Adie tomorrow.  She  was given informational handouts about both medications to further review.  She will notify us when she has made a decision and we proceed with the next steps in treatment at that time.  - Plan: CBC with Differential/Platelet, CMP14+EGFR, QuantiFERON-TB Gold Plus, Hepatitis C antibody, Hepatitis B surface antigen, Hepatitis B core antibody, IgM, Hepatitis A antibody, IgM, ANA, Anti-DNA antibody, double-stranded, Urinalysis, Routine w reflex microscopic, C3 and C4, Rheumatoid factor, Serum protein electrophoresis with reflex, Thiopurine methyltransferase(tpmt)rbc, IgG, IgA, IgM, Sedimentation rate, Protein / creatinine ratio, urine, CYCLIC CITRUL PEPTIDE ANTIBODY, IGG/IGA  Chronic inflammatory arthritis - RF negative: She continues to experience intermittent pain and inflammation in both hands and both knee joints.  At her last office visit she had synovitis involving multiple joints.  At that time she was hesitant to make any medication changes.  She continues to take Plaquenil 200 mg 1 tablet by mouth daily.  At her last office visit Dr. Estanislado Pandy discussed adding on [place] but she declined at that time.  She would like to further discuss these treatment options with Dr. Lindi Adie prior to proceeding.  I provided informational handouts about both medications for her to review.  She will notify us if she would like to start on therapy for management of chronic inflammatory arthritis.  The following baseline immunosuppressive labs were ordered today prior to initiating therapy.   High risk medication use - Plaquenil 200 mg 1 tablet by mouth daily.  PLQ Eye Exam: 01/19/2021.  Schedule to update Plaquenil eye exam in May 2023.  Orders for CBC and CMP were released today along with orders for baseline immunosuppressive labs.  Patient declined HIV testing.   - Plan: CBC with Differential/Platelet, CMP14+EGFR, QuantiFERON-TB Gold Plus, Hepatitis C antibody, Hepatitis B surface antigen, Hepatitis B core  antibody, IgM, Hepatitis A antibody, IgM, ANA, Anti-DNA antibody, double-stranded, Urinalysis, Routine w reflex microscopic, C3 and C4, Rheumatoid factor, Serum protein electrophoresis with reflex, Thiopurine methyltransferase(tpmt)rbc, IgG, IgA, IgM, Sedimentation rate, Protein / creatinine ratio, urine  Screening for tuberculosis -Order for TB gold released.  Plan: QuantiFERON-TB Gold Plus  Discoid lupus - No active discoid lesions at this time.  Anticardiolipin antibody positive -Anticardiolipin antibodies were initially positive but were negative with repeat testing. She remains on warfarin as prescribed due to history of DVT and PE.  Malignant neoplasm of lower-outer quadrant of right breast of female, estrogen receptor positive ([place]): She was diagnosed with triple positive stage IV breast cancer and has been following along closely with Dr. Jana Hakim.  Patient will be switching to Dr.Gudena as Dr. Jana Hakim is retiring.  Raynaud's disease without gangrene: She continues to have intermittent symptoms of raynaud's.  No digital ulcers or signs of gangrene were noted.   Primary osteoarthritis of both hands: She has PIP and DIP thickening consistent with osteoarthritis of both hands.  Tenderness of the DIPs of the right 2nd and 3rd fingers.  CMC joint thickening noted bilaterally.    Primary osteoarthritis of both knees - S/p visco in the past.  Patient had MRI of the right knee on 11/14/2021 which revealed a small Baker's cyst, small joint effusion, and patellofemoral greater than medial greater than lateral compartment cartilage loss.  Results were reviewed with the patient today in the office.   Discussed that she will benefit from adding imuran or orencia.  She will further review the information provided and will notify us once she has made a decision.   Primary insomnia: She has difficulty sleeping at night.  She remains on Adderall which helps her fatigue.  Vitamin D deficiency: She is not  currently taking a vitamin D supplement.  Other osteoporosis without current pathological fracture:  DEXA updated on 05/10/2021: AP spine BMD 0.761 with T score -2.6. She plans on discussing treatment options with Dr. Lindi Adie.   Other medical conditions are listed as follows:   History of pulmonary embolism - On warfarin  History of DVT (deep vein thrombosis)  History of IBS  History of anxiety  Port-A-Cath in place  Family history of hemochromatosis   Orders: Orders Placed This Encounter  Procedures   CBC with Differential/Platelet   CMP14+EGFR   QuantiFERON-TB Gold Plus   Hepatitis C antibody   Hepatitis B surface antigen   Hepatitis B core antibody, IgM   Hepatitis A antibody, IgM   ANA  Anti-DNA antibody, double-stranded   Urinalysis, Routine w reflex microscopic   C3 and C4   Rheumatoid factor   Serum protein electrophoresis with reflex   Thiopurine methyltransferase(tpmt)rbc   IgG, IgA, IgM   Sedimentation rate   Protein / creatinine ratio, urine   CYCLIC CITRUL PEPTIDE ANTIBODY, IGG/IGA   No orders of the defined types were placed in this encounter.    Follow-Up Instructions: Return in about 3 months (around 03/18/2022) for Systemic lupus erythematosus, chronic inflammatory arthritis.   Ofilia Neas, PA-C  Note - This record has been created using Dragon software.  Chart creation errors have been sought, but may not always  have been located. Such creation errors do not reflect on  the standard of medical care.

## 2021-12-12 ENCOUNTER — Other Ambulatory Visit: Payer: Self-pay | Admitting: Rheumatology

## 2021-12-12 ENCOUNTER — Other Ambulatory Visit: Payer: Self-pay

## 2021-12-12 ENCOUNTER — Ambulatory Visit (HOSPITAL_COMMUNITY)
Admission: RE | Admit: 2021-12-12 | Discharge: 2021-12-12 | Disposition: A | Discharge status: Home / Self Care | Payer: 59 | Source: Ambulatory Visit | Attending: Oncology | Admitting: Oncology

## 2021-12-12 DIAGNOSIS — C50511 Malignant neoplasm of lower-outer quadrant of right female breast: Secondary | ICD-10-CM | POA: Diagnosis not present

## 2021-12-12 DIAGNOSIS — C787 Secondary malignant neoplasm of liver and intrahepatic bile duct: Secondary | ICD-10-CM | POA: Diagnosis present

## 2021-12-12 DIAGNOSIS — Z86718 Personal history of other venous thrombosis and embolism: Secondary | ICD-10-CM | POA: Diagnosis not present

## 2021-12-12 DIAGNOSIS — Z79899 Other long term (current) drug therapy: Secondary | ICD-10-CM | POA: Insufficient documentation

## 2021-12-12 DIAGNOSIS — Z5181 Encounter for therapeutic drug level monitoring: Secondary | ICD-10-CM | POA: Insufficient documentation

## 2021-12-12 DIAGNOSIS — Z0189 Encounter for other specified special examinations: Secondary | ICD-10-CM

## 2021-12-12 DIAGNOSIS — Z86711 Personal history of pulmonary embolism: Secondary | ICD-10-CM | POA: Insufficient documentation

## 2021-12-12 DIAGNOSIS — Z170 Estrogen receptor positive status [ER+]: Secondary | ICD-10-CM | POA: Diagnosis not present

## 2021-12-12 DIAGNOSIS — L930 Discoid lupus erythematosus: Secondary | ICD-10-CM | POA: Insufficient documentation

## 2021-12-12 DIAGNOSIS — Z7189 Other specified counseling: Secondary | ICD-10-CM

## 2021-12-12 LAB — ECHOCARDIOGRAM COMPLETE
Area-P 1/2: 3.6 cm2
Calc EF: 59.3 %
S' Lateral: 2.7 cm
Single Plane A2C EF: 62.1 %
Single Plane A4C EF: 57.4 %

## 2021-12-12 MED ORDER — AMPHETAMINE-DEXTROAMPHET ER 20 MG PO CP24: 20.0000 mg | ORAL_CAPSULE | Freq: Every day | ORAL | 0 refills | Status: DC

## 2021-12-12 NOTE — Telephone Encounter (Signed)
Patient called the office requesting a refill of Adderall 20mg to be sent to [facility] on [address].  

## 2021-12-12 NOTE — Progress Notes (Signed)
°  Echocardiogram 2D Echocardiogram has been performed.  Erica Wood 12/12/2021, 10:04 AM

## 2021-12-12 NOTE — Telephone Encounter (Signed)
Next Visit: 12/19/2021   Last Visit: 10/16/2021   Last Fill: 11/08/2021   Dx: Fatigue   Current Dose per office note on 10/16/2021: not discussed   Okay to refill Adderall?

## 2021-12-14 MED ORDER — AMPHETAMINE-DEXTROAMPHET ER 20 MG PO CP24: 20.0000 mg | ORAL_CAPSULE | Freq: Every day | ORAL | 0 refills | Status: DC

## 2021-12-14 NOTE — Addendum Note (Signed)
Addended by: Carole Binning on: 12/14/2021 10:30 AM   Modules accepted: Orders

## 2021-12-19 ENCOUNTER — Other Ambulatory Visit: Payer: Self-pay

## 2021-12-19 ENCOUNTER — Ambulatory Visit: Payer: 59 | Admitting: Physician Assistant

## 2021-12-19 ENCOUNTER — Encounter: Payer: Self-pay | Admitting: Physician Assistant

## 2021-12-19 VITALS — BP 129/81 | HR 74 | Ht 64.0 in | Wt 116.0 lb

## 2021-12-19 DIAGNOSIS — M19041 Primary osteoarthritis, right hand: Secondary | ICD-10-CM

## 2021-12-19 DIAGNOSIS — F5101 Primary insomnia: Secondary | ICD-10-CM

## 2021-12-19 DIAGNOSIS — M1380 Other specified arthritis, unspecified site: Secondary | ICD-10-CM

## 2021-12-19 DIAGNOSIS — L930 Discoid lupus erythematosus: Secondary | ICD-10-CM | POA: Diagnosis not present

## 2021-12-19 DIAGNOSIS — I7300 Raynaud's syndrome without gangrene: Secondary | ICD-10-CM

## 2021-12-19 DIAGNOSIS — M1990 Unspecified osteoarthritis, unspecified site: Secondary | ICD-10-CM

## 2021-12-19 DIAGNOSIS — Z111 Encounter for screening for respiratory tuberculosis: Secondary | ICD-10-CM

## 2021-12-19 DIAGNOSIS — Z79899 Other long term (current) drug therapy: Secondary | ICD-10-CM

## 2021-12-19 DIAGNOSIS — C50511 Malignant neoplasm of lower-outer quadrant of right female breast: Secondary | ICD-10-CM

## 2021-12-19 DIAGNOSIS — Z95828 Presence of other vascular implants and grafts: Secondary | ICD-10-CM

## 2021-12-19 DIAGNOSIS — E559 Vitamin D deficiency, unspecified: Secondary | ICD-10-CM

## 2021-12-19 DIAGNOSIS — M329 Systemic lupus erythematosus, unspecified: Secondary | ICD-10-CM | POA: Diagnosis not present

## 2021-12-19 DIAGNOSIS — M19042 Primary osteoarthritis, left hand: Secondary | ICD-10-CM

## 2021-12-19 DIAGNOSIS — M818 Other osteoporosis without current pathological fracture: Secondary | ICD-10-CM

## 2021-12-19 DIAGNOSIS — Z86718 Personal history of other venous thrombosis and embolism: Secondary | ICD-10-CM

## 2021-12-19 DIAGNOSIS — R760 Raised antibody titer: Secondary | ICD-10-CM

## 2021-12-19 DIAGNOSIS — Z86711 Personal history of pulmonary embolism: Secondary | ICD-10-CM

## 2021-12-19 DIAGNOSIS — Z170 Estrogen receptor positive status [ER+]: Secondary | ICD-10-CM

## 2021-12-19 DIAGNOSIS — Z8719 Personal history of other diseases of the digestive system: Secondary | ICD-10-CM

## 2021-12-19 DIAGNOSIS — Z8659 Personal history of other mental and behavioral disorders: Secondary | ICD-10-CM

## 2021-12-19 DIAGNOSIS — M170 Bilateral primary osteoarthritis of knee: Secondary | ICD-10-CM

## 2021-12-19 DIAGNOSIS — I2782 Chronic pulmonary embolism: Secondary | ICD-10-CM

## 2021-12-19 DIAGNOSIS — Z8349 Family history of other endocrine, nutritional and metabolic diseases: Secondary | ICD-10-CM

## 2021-12-19 NOTE — Progress Notes (Signed)
Patient Care Team: Eunice Blase, MD as PCP - General (Family Medicine) Prudence Davidson, [place] Bing, MD as Referring Physician (Oncology) Rockwell Germany, RN as Oncology Nurse Navigator Mauro Kaufmann, RN as Oncology Nurse Navigator Rolm Bookbinder, MD as Consulting Physician (General Surgery) Kyung Rudd, MD as Consulting Physician (Radiation Oncology) Bo Merino, MD as Consulting Physician (Rheumatology) Olga Millers, MD as Consulting Physician (Obstetrics and Gynecology) Larey Dresser, MD as Consulting Physician (Cardiology) Armbruster, Carlota Raspberry, MD as Consulting Physician (Gastroenterology) Nena Jordan, MD as Referring Physician (Ophthalmology) Nena Jordan, MD as Referring Physician (Ophthalmology) Nicholas Lose, MD as Consulting Physician (Hematology and Oncology)  DIAGNOSIS:    ICD-10-CM   1. Malignant neoplasm of lower-outer quadrant of right breast of female, estrogen receptor positive ([place])  C50.511    Z17.0       SUMMARY OF ONCOLOGIC HISTORY: Oncology History  Malignant neoplasm of lower-outer quadrant of right breast of female, estrogen receptor positive ([place])  08/04/2020 Initial Diagnosis   Right breast lower outer quadrant biopsy 08/04/2020 for a clinical T2 N0 M1, stage IV invasive ductal carcinoma, grade 3, triple positive, with an MIB-1 of 40%. abdominal MRI 08/24/2020 shows multiple liver lesions highly suspicious for liver metastases. Liver biopsy 09/20/2020 positive for carcinoma, prognostic panel estrogen and progesterone receptor positive, but HER-2 negative (0); MIB-1 of 30%   08/31/2020 - 12/14/2020 Chemotherapy   Neoadjuvant chemotherapy consisting of docetaxel, carboplatin, trastuzumab and pertuzumab every 21 days x 6    12/26/2020 Imaging   breast MRI 12/26/2020 showed right breast index lesion decreased to 0.8 cm; 2 additional masses in the right breast stable abdominal MRI 01/29/2021 shows no active disease in the liver     Chemotherapy   Herceptin Maintenance initially q 3 weeks, now q 4 weeks   01/29/2021 -  Anti-estrogen oral therapy   anastrozole and palbociclib    Liver metastases ([place])  01/03/2021 Initial Diagnosis   Liver metastases ([place])   01/03/2021 -  Chemotherapy   Patient is on Treatment Plan : BREAST Trastuzumab q21d       CHIEF COMPLIANT: Cycle 17 Herceptin  INTERVAL HISTORY: Erica Wood is a 56 y.o. with above-mentioned history of triple positive stage IV breast cancer, currently on chemotherapy with Herceptin. She presents to the clinic today for treatment.  She had COVID infection in January and her treatments were held.  She tells me that since Leslee Home was held along with the anastrozole her joint swellings have come down.  She has a history of lupus and sees rheumatology.  ALLERGIES:  is allergic to beef-derived products, latex, and sulfa antibiotics.  MEDICATIONS:  Current Outpatient Medications  Medication Sig Dispense Refill   acetaminophen (TYLENOL) 500 MG tablet Take 1,000 mg by mouth every 6 (six) hours as needed for moderate pain or headache. (Patient not taking: Reported on 12/19/2021)     amphetamine-dextroamphetamine (ADDERALL XR) 20 MG 24 hr capsule Take 1 capsule (20 mg total) by mouth daily. 30 capsule 0   anastrozole (ARIMIDEX) 1 MG tablet Take 1 tablet (1 mg total) by mouth daily. 90 tablet 4   Ascorbic Acid (VITAMIN C PO) Take 3,000 mg by mouth daily.  (Patient not taking: Reported on 12/19/2021)     Cholecalciferol (VITAMIN D3) 75 MCG (3000 UT) TABS Take 3,000 Units by mouth daily. (Patient not taking: Reported on 12/19/2021)     Clindamycin-Benzoyl Per, Refr, gel Apply topically. (Patient not taking: Reported on 12/19/2021)     hydroxychloroquine (PLAQUENIL) 200  MG tablet Take 1 tablet (200 mg total) by mouth daily. 90 tablet 4   IBRANCE 100 MG tablet TAKE 1 TABLET BY MOUTH ONCE DAILY WITH OR WITHOUT FOOD  FOR 21 DAYS, FOLLOWED BY 7  DAYS OFF, REPEATED EVERY 28 DAYS  (Patient not taking: Reported on 12/19/2021) 21 tablet 6   lidocaine-prilocaine (EMLA) cream Apply topically. (Patient not taking: Reported on 12/19/2021)     metoCLOPramide (REGLAN) 10 MG tablet Take 5-10 mg by mouth as needed. (Patient not taking: Reported on 12/19/2021)     neomycin-polymyxin b-dexamethasone (MAXITROL) 3.5-10000-0.1 SUSP 1 drop as needed. (Patient not taking: Reported on 12/19/2021)     RESTASIS MULTIDOSE 0.05 % ophthalmic emulsion Place 1 drop into both eyes 2 (two) times daily. (Patient not taking: Reported on 12/19/2021) 11 mL 5   spironolactone (ALDACTONE) 25 MG tablet Take 25 mg by mouth daily as needed (swelling). (Patient not taking: Reported on 12/19/2021)     traMADol (ULTRAM) 50 MG tablet Take 1-2 tablets (50-100 mg total) by mouth every 6 (six) hours as needed. 60 tablet 0   tretinoin (RETIN-A) 0.1 % cream Apply 1 application topically at bedtime as needed (acne).      triamterene-hydrochlorothiazide (DYAZIDE) 37.5-25 MG capsule TAKE 1 CAPSULE BY MOUTH DAILY AS NEEDED FOR SWELLING 90 capsule 0   warfarin (COUMADIN) 5 MG tablet TAKE 1 TABLET(5 MG) BY MOUTH DAILY 30 tablet PRN   No current facility-administered medications for this visit.    PHYSICAL EXAMINATION: ECOG PERFORMANCE STATUS: 1 - Symptomatic but completely ambulatory  Vitals:   12/20/21 0935  BP: 116/67  Pulse: 69  Resp: 18  Temp: 97.9 F (36.6 C)  SpO2: 100%   Filed Weights   12/20/21 0935  Weight: 110 lb 1.6 oz (49.9 kg)    LABORATORY DATA:  I have reviewed the data as listed CMP Latest Ref Rng & Units 12/19/2021 10/31/2021 10/03/2021  Glucose 70 - 99 mg/dL 81 67(L) 91  BUN 6 - 24 mg/dL 14 14 24(H)  Creatinine 0.57 - 1.00 mg/dL 0.75 1.07(H) 0.92  Sodium 134 - 144 mmol/L 139 137 137  Potassium 3.5 - 5.2 mmol/L 4.7 4.0 4.6  Chloride 96 - 106 mmol/L 101 102 103  CO2 20 - 29 mmol/L _0 Calcium 8.7 - 10.2 mg/dL 9.2 9.4 8.9  Total Protein 6.0 - 8.5 g/dL 6.5 7.0 6.7  Total Bilirubin 0.0 - 1.2  mg/dL 0.3 0.4 0.4  Alkaline Phos 44 - 121 IU/L 126(H) 102 96  AST 0 - 40 IU/L _1 ALT 0 - 32 IU/L _2 Lab Results  Component Value Date   WBC 4.4 12/20/2021   HGB 12.5 12/20/2021   HCT 36.9 12/20/2021   MCV 94.4 12/20/2021   PLT 138 (L) 12/20/2021   NEUTROABS 2.1 12/20/2021    ASSESSMENT & PLAN:  Malignant neoplasm of lower-outer quadrant of right breast of female, estrogen receptor positive ([place]) 08/04/2020: Right breast biopsy T2 N0 M1 stage IV IDC grade 3, triple positive with a Ki-67 of 40%, multiple liver lesions biopsy was positive for breast cancer ER/PR positive HER2 negative with a Ki-67 30% 08/31/2020-12/14/2020: TCHP x6  Current treatment: Herceptin maintenance every 4 weeks, anastrozole and palbociclib (started 01/29/2021) Treatment toxicities: Severe constipation: She had constipation problems even prior to starting treatments.  She is using Dulcolax as needed Gastritis: I sent a prescription for Pepcid  COVID infection January 2023: Held Leslee Home and Herceptin treatments.  We  are resuming her treatments today. History of lupus: Sees rheumatology  Joint swelling: I switched her from anastrozole to letrozole. Recommendation: Plan to obtain a   CT scan for restaging  Return to clinic after the CT scan to discuss results.    No orders of the defined types were placed in this encounter.  The patient has a good understanding of the overall plan. she agrees with it. she will call with any problems that may develop before the next visit here.  Total time spent: 60 mins including face to face time and time spent for planning, charting and coordination of care  Rulon Eisenmenger, MD, MPH 12/20/2021  I, Thana Ates, am acting as scribe for Dr. Nicholas Lose.  I have reviewed the above documentation for accuracy and completeness, and I agree with the above.

## 2021-12-20 ENCOUNTER — Inpatient Hospital Stay: Payer: 59 | Admitting: Hematology and Oncology

## 2021-12-20 ENCOUNTER — Inpatient Hospital Stay: Payer: 59

## 2021-12-20 ENCOUNTER — Other Ambulatory Visit: Payer: Self-pay | Admitting: *Deleted

## 2021-12-20 ENCOUNTER — Encounter: Payer: Self-pay | Admitting: Hematology and Oncology

## 2021-12-20 ENCOUNTER — Inpatient Hospital Stay: Payer: 59 | Attending: Oncology

## 2021-12-20 VITALS — BP 116/67 | HR 69 | Temp 97.9°F | Resp 18 | Ht 64.0 in | Wt 110.1 lb

## 2021-12-20 DIAGNOSIS — C50511 Malignant neoplasm of lower-outer quadrant of right female breast: Secondary | ICD-10-CM

## 2021-12-20 DIAGNOSIS — Z170 Estrogen receptor positive status [ER+]: Secondary | ICD-10-CM

## 2021-12-20 DIAGNOSIS — Z5112 Encounter for antineoplastic immunotherapy: Secondary | ICD-10-CM | POA: Diagnosis not present

## 2021-12-20 DIAGNOSIS — C787 Secondary malignant neoplasm of liver and intrahepatic bile duct: Secondary | ICD-10-CM | POA: Insufficient documentation

## 2021-12-20 DIAGNOSIS — Z86718 Personal history of other venous thrombosis and embolism: Secondary | ICD-10-CM | POA: Insufficient documentation

## 2021-12-20 DIAGNOSIS — I2782 Chronic pulmonary embolism: Secondary | ICD-10-CM

## 2021-12-20 DIAGNOSIS — Z7901 Long term (current) use of anticoagulants: Secondary | ICD-10-CM | POA: Diagnosis not present

## 2021-12-20 DIAGNOSIS — Z95828 Presence of other vascular implants and grafts: Secondary | ICD-10-CM

## 2021-12-20 LAB — CBC WITH DIFFERENTIAL (CANCER CENTER ONLY)
Abs Immature Granulocytes: 0.01 10*3/uL (ref 0.00–0.07)
Basophils Absolute: 0.1 10*3/uL (ref 0.0–0.1)
Basophils Relative: 1 %
Eosinophils Absolute: 0.1 10*3/uL (ref 0.0–0.5)
Eosinophils Relative: 2 %
HCT: 36.9 % (ref 36.0–46.0)
Hemoglobin: 12.5 g/dL (ref 12.0–15.0)
Immature Granulocytes: 0 %
Lymphocytes Relative: 36 %
Lymphs Abs: 1.6 10*3/uL (ref 0.7–4.0)
MCH: 32 pg (ref 26.0–34.0)
MCHC: 33.9 g/dL (ref 30.0–36.0)
MCV: 94.4 fL (ref 80.0–100.0)
Monocytes Absolute: 0.5 10*3/uL (ref 0.1–1.0)
Monocytes Relative: 12 %
Neutro Abs: 2.1 10*3/uL (ref 1.7–7.7)
Neutrophils Relative %: 49 %
Platelet Count: 138 10*3/uL — ABNORMAL LOW (ref 150–400)
RBC: 3.91 MIL/uL (ref 3.87–5.11)
RDW: 12.7 % (ref 11.5–15.5)
WBC Count: 4.4 10*3/uL (ref 4.0–10.5)
nRBC: 0 % (ref 0.0–0.2)

## 2021-12-20 LAB — CMP (CANCER CENTER ONLY)
ALT: 13 U/L (ref 0–44)
AST: 18 U/L (ref 15–41)
Albumin: 4.1 g/dL (ref 3.5–5.0)
Alkaline Phosphatase: 109 U/L (ref 38–126)
Anion gap: 5 (ref 5–15)
BUN: 11 mg/dL (ref 6–20)
CO2: 27 mmol/L (ref 22–32)
Calcium: 9.2 mg/dL (ref 8.9–10.3)
Chloride: 106 mmol/L (ref 98–111)
Creatinine: 0.77 mg/dL (ref 0.44–1.00)
GFR, Estimated: >60 mL/min (ref >60)
Glucose, Bld: 92 mg/dL (ref 70–99)
Potassium: 3.9 mmol/L (ref 3.5–5.1)
Sodium: 138 mmol/L (ref 135–145)
Total Bilirubin: 0.4 mg/dL (ref 0.3–1.2)
Total Protein: 6.7 g/dL (ref 6.5–8.1)

## 2021-12-20 LAB — PROTIME-INR
INR: 2 — ABNORMAL HIGH (ref 0.8–1.2)
Prothrombin Time: 23 seconds — ABNORMAL HIGH (ref 11.4–15.2)

## 2021-12-20 MED ORDER — SODIUM CHLORIDE 0.9 % IV SOLN: Freq: Once | INTRAVENOUS | Status: AC

## 2021-12-20 MED ORDER — DIPHENHYDRAMINE HCL 25 MG PO CAPS
25.0000 mg | ORAL_CAPSULE | Freq: Once | ORAL | Status: AC
  Administered 2021-12-20: 25 mg via ORAL
  Filled 2021-12-20: qty 1

## 2021-12-20 MED ORDER — TRASTUZUMAB-ANNS CHEMO 150 MG IV SOLR
8.0000 mg/kg | Freq: Once | INTRAVENOUS | Status: AC
  Administered 2021-12-20: 420 mg via INTRAVENOUS
  Filled 2021-12-20: qty 20

## 2021-12-20 MED ORDER — SODIUM CHLORIDE 0.9% FLUSH: 10.0000 mL | INTRAVENOUS | Status: DC | PRN

## 2021-12-20 MED ORDER — HEPARIN SOD (PORK) LOCK FLUSH 100 UNIT/ML IV SOLN
500.0000 [IU] | Freq: Once | INTRAVENOUS | Status: AC | PRN
  Administered 2021-12-20: 500 [IU]

## 2021-12-20 MED ORDER — SODIUM CHLORIDE 0.9% FLUSH
10.0000 mL | Freq: Once | INTRAVENOUS | Status: AC
  Administered 2021-12-20: 10 mL

## 2021-12-20 MED ORDER — ACETAMINOPHEN 325 MG PO TABS
650.0000 mg | ORAL_TABLET | Freq: Once | ORAL | Status: AC
  Administered 2021-12-20: 650 mg via ORAL
  Filled 2021-12-20: qty 2

## 2021-12-20 MED ORDER — LETROZOLE 2.5 MG PO TABS: 2.5000 mg | ORAL_TABLET | Freq: Every day | ORAL | 3 refills | Status: DC

## 2021-12-20 NOTE — Patient Instructions (Signed)
[facility]COL[facility]  Discharge Instructions: °Thank you for choosing [facility] to provide your oncology and hematology care.  ° °If you have a lab appointment with the Cancer Center, please go directly to the Cancer Center and check in at the registration area. °  °Wear comfortable clothing and clothing appropriate for easy access to any Portacath or PICC line.  ° °We strive to give you quality time with your provider. You may need to reschedule your appointment if you arrive late (15 or more minutes).  Arriving late affects you and other patients whose appointments are after yours.  Also, if you miss three or more appointments without notifying the office, you may be dismissed from the clinic at the provider’s discretion.    °  °For prescription refill requests, have your pharmacy contact our office and allow 72 hours for refills to be completed.   ° °Today you received the following chemotherapy and/or immunotherapy agents: Kanjinti °  °To help prevent nausea and vomiting after your treatment, we encourage you to take your nausea medication as directed. ° °BELOW ARE SYMPTOMS THAT SHOULD BE REPORTED IMMEDIATELY: °*FEVER GREATER THAN 100.4 F (38 °C) OR HIGHER °*CHILLS OR SWEATING °*NAUSEA AND VOMITING THAT IS NOT CONTROLLED WITH YOUR NAUSEA MEDICATION °*UNUSUAL SHORTNESS OF BREATH °*UNUSUAL BRUISING OR BLEEDING °*URINARY PROBLEMS (pain or burning when urinating, or frequent urination) °*BOWEL PROBLEMS (unusual diarrhea, constipation, pain near the anus) °TENDERNESS IN MOUTH AND THROAT WITH OR WITHOUT PRESENCE OF ULCERS (sore throat, sores in mouth, or a toothache) °UNUSUAL RASH, SWELLING OR PAIN  °UNUSUAL VAGINAL DISCHARGE OR ITCHING  ° °Items with * indicate a potential emergency and should be followed up as soon as possible or go to the Emergency Department if any problems should occur. ° °Please show the CHEMOTHERAPY ALERT CARD or IMMUNOTHERAPY ALERT CARD at check-in to the  Emergency Department and triage nurse. ° °Should you have questions after your visit or need to cancel or reschedule your appointment, please contact [facility]  Dept: [phone]  and follow the prompts.  Office hours are 8:00 a.m. to 4:30 p.m. Monday - Friday. Please note that voicemails left after 4:00 p.m. may not be returned until the following business day.  We are closed weekends and major holidays. You have access to a nurse at all times for urgent questions. Please call the main number to the clinic Dept: [phone] and follow the prompts. ° ° °For any non-urgent questions, you may also contact your provider using MyChart. We now offer e-Visits for anyone 18 and older to request care online for non-urgent symptoms. For details visit [email or web address]. °  °Also download the MyChart app! Go to the app store, search "MyChart", open the app, select , and log in with your MyChart username and password. ° °Due to Covid, a mask is required upon entering the hospital/clinic. If you do not have a mask, one will be given to you upon arrival. For doctor visits, patients may have 1 support person aged 18 or older with them. For treatment visits, patients cannot have anyone with them due to current Covid guidelines and our immunocompromised population.  ° °

## 2021-12-20 NOTE — Assessment & Plan Note (Signed)
08/04/2020: Right breast biopsy T2 N0 M1 stage IV IDC grade 3, triple positive with a Ki-67 of 40%, multiple liver lesions biopsy was positive for breast cancer ER/PR positive HER2 negative with a Ki-67 30% 08/31/2020-12/14/2020: TCHP x6  Current treatment: Herceptin maintenance every 4 weeks, anastrozole and palbociclib (started 01/29/2021) Treatment toxicities:  Recommendation: Plan to obtain a PET CT scan for restaging Return to clinic after the PET scan to discuss results.

## 2021-12-21 ENCOUNTER — Encounter: Payer: Self-pay | Admitting: Hematology and Oncology

## 2021-12-21 LAB — CANCER ANTIGEN 27.29: CA 27.29: 82 U/mL — ABNORMAL HIGH (ref 0.0–38.6)

## 2021-12-22 ENCOUNTER — Encounter: Payer: Self-pay | Admitting: Hematology and Oncology

## 2021-12-24 ENCOUNTER — Encounter: Payer: Self-pay | Admitting: Hematology and Oncology

## 2021-12-24 ENCOUNTER — Other Ambulatory Visit: Payer: Self-pay | Admitting: *Deleted

## 2021-12-24 MED ORDER — TRIAMTERENE-HCTZ 37.5-25 MG PO CAPS: 1.0000 | ORAL_CAPSULE | Freq: Every day | ORAL | 1 refills | Status: DC

## 2021-12-24 NOTE — Progress Notes (Signed)
Per pt request, refill for triamterene-hydrochlorothiazide 37.5-25mg  sent to pharmacy on file.

## 2021-12-25 ENCOUNTER — Encounter: Payer: Self-pay | Admitting: Hematology and Oncology

## 2021-12-25 LAB — CMP14+EGFR
ALT: 14 IU/L (ref 0–32)
AST: 19 IU/L (ref 0–40)
Albumin/Globulin Ratio: 2.6 — ABNORMAL HIGH (ref 1.2–2.2)
Albumin: 4.7 g/dL (ref 3.8–4.9)
Alkaline Phosphatase: 126 IU/L — ABNORMAL HIGH (ref 44–121)
BUN/Creatinine Ratio: 19 (ref 9–23)
BUN: 14 mg/dL (ref 6–24)
Bilirubin Total: 0.3 mg/dL (ref 0.0–1.2)
CO2: 24 mmol/L (ref 20–29)
Calcium: 9.2 mg/dL (ref 8.7–10.2)
Chloride: 101 mmol/L (ref 96–106)
Creatinine, Ser: 0.75 mg/dL (ref 0.57–1.00)
Globulin, Total: 1.8 g/dL (ref 1.5–4.5)
Glucose: 81 mg/dL (ref 70–99)
Potassium: 4.7 mmol/L (ref 3.5–5.2)
Sodium: 139 mmol/L (ref 134–144)
Total Protein: 6.5 g/dL (ref 6.0–8.5)
eGFR: 95 mL/min/{1.73_m2} (ref >59)

## 2021-12-25 LAB — HEPATITIS B SURFACE ANTIGEN: Hepatitis B Surface Ag: NEGATIVE

## 2021-12-25 LAB — CBC WITH DIFFERENTIAL/PLATELET
Basophils Absolute: 0.1 10*3/uL (ref 0.0–0.2)
Basos: 2 %
EOS (ABSOLUTE): 0.1 10*3/uL (ref 0.0–0.4)
Eos: 2 %
Hematocrit: 41.9 % (ref 34.0–46.6)
Hemoglobin: 14 g/dL (ref 11.1–15.9)
Immature Grans (Abs): 0 10*3/uL (ref 0.0–0.1)
Immature Granulocytes: 0 %
Lymphocytes Absolute: 1.6 10*3/uL (ref 0.7–3.1)
Lymphs: 29 %
MCH: 31.4 pg (ref 26.6–33.0)
MCHC: 33.4 g/dL (ref 31.5–35.7)
MCV: 94 fL (ref 79–97)
Monocytes Absolute: 0.5 10*3/uL (ref 0.1–0.9)
Monocytes: 9 %
Neutrophils Absolute: 3.2 10*3/uL (ref 1.4–7.0)
Neutrophils: 58 %
Platelets: 172 10*3/uL (ref 150–450)
RBC: 4.46 x10E6/uL (ref 3.77–5.28)
RDW: 12.3 % (ref 11.7–15.4)
WBC: 5.4 10*3/uL (ref 3.4–10.8)

## 2021-12-25 LAB — QUANTIFERON-TB GOLD PLUS
QuantiFERON Mitogen Value: >10 IU/mL
QuantiFERON Nil Value: 0.04 IU/mL
QuantiFERON TB1 Ag Value: 0.04 IU/mL
QuantiFERON TB2 Ag Value: 0.04 IU/mL
QuantiFERON-TB Gold Plus: NEGATIVE

## 2021-12-25 LAB — PROTEIN ELECTROPHORESIS, SERUM, WITH REFLEX
A/G Ratio: 1.4 (ref 0.7–1.7)
Albumin ELP: 3.8 g/dL (ref 2.9–4.4)
Alpha 1: 0.2 g/dL (ref 0.0–0.4)
Alpha 2: 0.7 g/dL (ref 0.4–1.0)
Beta: 0.8 g/dL (ref 0.7–1.3)
Gamma Globulin: 1 g/dL (ref 0.4–1.8)
Globulin, Total: 2.7 g/dL (ref 2.2–3.9)

## 2021-12-25 LAB — URINALYSIS, ROUTINE W REFLEX MICROSCOPIC
Bilirubin, UA: NEGATIVE
Glucose, UA: NEGATIVE
Ketones, UA: NEGATIVE
Leukocytes,UA: NEGATIVE
Nitrite, UA: NEGATIVE
Protein,UA: NEGATIVE
RBC, UA: NEGATIVE
Specific Gravity, UA: 1.009 (ref 1.005–1.030)
Urobilinogen, Ur: 0.2 mg/dL (ref 0.2–1.0)
pH, UA: 6 (ref 5.0–7.5)

## 2021-12-25 LAB — C3 AND C4
Complement C3, Serum: 114 mg/dL (ref 82–167)
Complement C4, Serum: 21 mg/dL (ref 12–38)

## 2021-12-25 LAB — THIOPURINE METHYLTRANSFERASE (TPMT), RBC: TPMT Activity:: 23.7 Units/mL RBC

## 2021-12-25 LAB — HEPATITIS A ANTIBODY, IGM: Hep A IgM: NEGATIVE

## 2021-12-25 LAB — HEPATITIS B CORE ANTIBODY, IGM: Hep B C IgM: NEGATIVE

## 2021-12-25 LAB — HEPATITIS C ANTIBODY: Hep C Virus Ab: <0.1 s/co ratio (ref 0.0–0.9)

## 2021-12-25 LAB — ANTI-DNA ANTIBODY, DOUBLE-STRANDED: dsDNA Ab: <1 IU/mL (ref 0–9)

## 2021-12-25 LAB — RHEUMATOID FACTOR: Rheumatoid fact SerPl-aCnc: <10 IU/mL (ref <14.0)

## 2021-12-25 LAB — PROTEIN / CREATININE RATIO, URINE
Creatinine, Urine: 28.4 mg/dL
Protein, Ur: <4 mg/dL

## 2021-12-25 LAB — IGG, IGA, IGM
IgA/Immunoglobulin A, Serum: 115 mg/dL (ref 87–352)
IgG (Immunoglobin G), Serum: 1079 mg/dL (ref 586–1602)
IgM (Immunoglobulin M), Srm: 164 mg/dL (ref 26–217)

## 2021-12-25 LAB — ANA: Anti Nuclear Antibody (ANA): NEGATIVE

## 2021-12-25 LAB — SEDIMENTATION RATE: Sed Rate: 15 mm/hr (ref 0–40)

## 2021-12-31 ENCOUNTER — Telehealth: Payer: Self-pay

## 2021-12-31 ENCOUNTER — Other Ambulatory Visit (HOSPITAL_COMMUNITY): Payer: Self-pay

## 2021-12-31 NOTE — Telephone Encounter (Signed)
Oral Oncology Patient Advocate Encounter   Received notification from [employer] that prior authorization for Erica Wood is required.   PA submitted on CoverMyMeds Key [id number] Status is pending   Oral Oncology Clinic will continue to follow.   [place] Patient [place] Phone [phone] Fax [phone] 12/31/2021 11:13 AM

## 2021-12-31 NOTE — Telephone Encounter (Signed)
Oral Oncology Patient Advocate Encounter  Prior Authorization for Leslee Home has been approved.    PA# [id number] Effective dates: 12/31/21 through 12/31/22  Oral Oncology Clinic will continue to follow.   [place] Patient [place] Phone [phone] Fax [phone] 12/31/2021 11:14 AM

## 2022-01-08 ENCOUNTER — Other Ambulatory Visit: Payer: Self-pay

## 2022-01-08 DIAGNOSIS — C50511 Malignant neoplasm of lower-outer quadrant of right female breast: Secondary | ICD-10-CM

## 2022-01-09 ENCOUNTER — Other Ambulatory Visit: Payer: Self-pay | Admitting: *Deleted

## 2022-01-09 ENCOUNTER — Other Ambulatory Visit: Payer: Self-pay

## 2022-01-09 ENCOUNTER — Inpatient Hospital Stay: Payer: 59

## 2022-01-09 ENCOUNTER — Telehealth: Payer: Self-pay | Admitting: Hematology and Oncology

## 2022-01-09 ENCOUNTER — Other Ambulatory Visit: Payer: Self-pay | Admitting: Hematology and Oncology

## 2022-01-09 ENCOUNTER — Inpatient Hospital Stay: Payer: 59 | Attending: Oncology

## 2022-01-09 ENCOUNTER — Telehealth: Payer: Self-pay | Admitting: *Deleted

## 2022-01-09 VITALS — BP 120/52 | HR 65 | Temp 98.0°F | Resp 16 | Wt 108.2 lb

## 2022-01-09 DIAGNOSIS — Z86718 Personal history of other venous thrombosis and embolism: Secondary | ICD-10-CM | POA: Diagnosis not present

## 2022-01-09 DIAGNOSIS — C50511 Malignant neoplasm of lower-outer quadrant of right female breast: Secondary | ICD-10-CM

## 2022-01-09 DIAGNOSIS — C787 Secondary malignant neoplasm of liver and intrahepatic bile duct: Secondary | ICD-10-CM | POA: Insufficient documentation

## 2022-01-09 DIAGNOSIS — Z86711 Personal history of pulmonary embolism: Secondary | ICD-10-CM | POA: Insufficient documentation

## 2022-01-09 DIAGNOSIS — Z7901 Long term (current) use of anticoagulants: Secondary | ICD-10-CM | POA: Diagnosis not present

## 2022-01-09 DIAGNOSIS — Z95828 Presence of other vascular implants and grafts: Secondary | ICD-10-CM

## 2022-01-09 DIAGNOSIS — Z170 Estrogen receptor positive status [ER+]: Secondary | ICD-10-CM | POA: Diagnosis not present

## 2022-01-09 DIAGNOSIS — Z5112 Encounter for antineoplastic immunotherapy: Secondary | ICD-10-CM | POA: Diagnosis not present

## 2022-01-09 DIAGNOSIS — R760 Raised antibody titer: Secondary | ICD-10-CM

## 2022-01-09 LAB — CBC WITH DIFFERENTIAL (CANCER CENTER ONLY)
Abs Immature Granulocytes: 0.01 10*3/uL (ref 0.00–0.07)
Basophils Absolute: 0 10*3/uL (ref 0.0–0.1)
Basophils Relative: 2 %
Eosinophils Absolute: 0 10*3/uL (ref 0.0–0.5)
Eosinophils Relative: 0 %
HCT: 30.8 % — ABNORMAL LOW (ref 36.0–46.0)
Hemoglobin: 10.6 g/dL — ABNORMAL LOW (ref 12.0–15.0)
Immature Granulocytes: 0 %
Lymphocytes Relative: 52 %
Lymphs Abs: 1.3 10*3/uL (ref 0.7–4.0)
MCH: 32.2 pg (ref 26.0–34.0)
MCHC: 34.4 g/dL (ref 30.0–36.0)
MCV: 93.6 fL (ref 80.0–100.0)
Monocytes Absolute: 0.2 10*3/uL (ref 0.1–1.0)
Monocytes Relative: 7 %
Neutro Abs: 1 10*3/uL — ABNORMAL LOW (ref 1.7–7.7)
Neutrophils Relative %: 39 %
Platelet Count: 95 10*3/uL — ABNORMAL LOW (ref 150–400)
RBC: 3.29 MIL/uL — ABNORMAL LOW (ref 3.87–5.11)
RDW: 14.1 % (ref 11.5–15.5)
WBC Count: 2.5 10*3/uL — ABNORMAL LOW (ref 4.0–10.5)
nRBC: 0 % (ref 0.0–0.2)

## 2022-01-09 LAB — CMP (CANCER CENTER ONLY)
ALT: 14 U/L (ref 0–44)
AST: 17 U/L (ref 15–41)
Albumin: 3.8 g/dL (ref 3.5–5.0)
Alkaline Phosphatase: 85 U/L (ref 38–126)
Anion gap: 5 (ref 5–15)
BUN: 12 mg/dL (ref 6–20)
CO2: 27 mmol/L (ref 22–32)
Calcium: 8.5 mg/dL — ABNORMAL LOW (ref 8.9–10.3)
Chloride: 106 mmol/L (ref 98–111)
Creatinine: 0.76 mg/dL (ref 0.44–1.00)
GFR, Estimated: >60 mL/min (ref >60)
Glucose, Bld: 86 mg/dL (ref 70–99)
Potassium: 3.7 mmol/L (ref 3.5–5.1)
Sodium: 138 mmol/L (ref 135–145)
Total Bilirubin: 0.4 mg/dL (ref 0.3–1.2)
Total Protein: 6.5 g/dL (ref 6.5–8.1)

## 2022-01-09 LAB — PROTIME-INR
INR: 1.4 — ABNORMAL HIGH (ref 0.8–1.2)
Prothrombin Time: 17.1 seconds — ABNORMAL HIGH (ref 11.4–15.2)

## 2022-01-09 MED ORDER — SODIUM CHLORIDE 0.9 % IV SOLN: Freq: Once | INTRAVENOUS | Status: AC

## 2022-01-09 MED ORDER — SODIUM CHLORIDE 0.9% FLUSH
10.0000 mL | Freq: Once | INTRAVENOUS | Status: AC
  Administered 2022-01-09: 10 mL

## 2022-01-09 MED ORDER — SODIUM CHLORIDE 0.9% FLUSH
10.0000 mL | INTRAVENOUS | Status: DC | PRN
  Administered 2022-01-09: 10 mL

## 2022-01-09 MED ORDER — ACETAMINOPHEN 325 MG PO TABS
650.0000 mg | ORAL_TABLET | Freq: Once | ORAL | Status: AC
  Administered 2022-01-09: 650 mg via ORAL
  Filled 2022-01-09: qty 2

## 2022-01-09 MED ORDER — DIPHENHYDRAMINE HCL 25 MG PO CAPS
25.0000 mg | ORAL_CAPSULE | Freq: Once | ORAL | Status: AC
  Administered 2022-01-09: 25 mg via ORAL
  Filled 2022-01-09: qty 1

## 2022-01-09 MED ORDER — HEPARIN SOD (PORK) LOCK FLUSH 100 UNIT/ML IV SOLN
500.0000 [IU] | Freq: Once | INTRAVENOUS | Status: AC | PRN
  Administered 2022-01-09: 500 [IU]

## 2022-01-09 MED ORDER — TRASTUZUMAB-ANNS CHEMO 150 MG IV SOLR
300.0000 mg | Freq: Once | INTRAVENOUS | Status: AC
  Administered 2022-01-09: 300 mg via INTRAVENOUS
  Filled 2022-01-09: qty 14.29

## 2022-01-09 MED ORDER — TRASTUZUMAB-ANNS CHEMO 150 MG IV SOLR: 8.0000 mg/kg | Freq: Once | INTRAVENOUS | Status: DC

## 2022-01-09 NOTE — Patient Instructions (Signed)
[facility]   ?Discharge Instructions: ?Thank you for choosing [facility] to provide your oncology and hematology care.  ? ?If you have a lab appointment with the Cancer Center, please go directly to the Cancer Center and check in at the registration area. ?  ?Wear comfortable clothing and clothing appropriate for easy access to any Portacath or PICC line.  ? ?We strive to give you quality time with your provider. You may need to reschedule your appointment if you arrive late (15 or more minutes).  Arriving late affects you and other patients whose appointments are after yours.  Also, if you miss three or more appointments without notifying the office, you may be dismissed from the clinic at the provider?s discretion.    ?  ?For prescription refill requests, have your pharmacy contact our office and allow 72 hours for refills to be completed.   ? ?Today you received the following chemotherapy and/or immunotherapy agents: trastuzumab-anns    ?  ?To help prevent nausea and vomiting after your treatment, we encourage you to take your nausea medication as directed. ? ?BELOW ARE SYMPTOMS THAT SHOULD BE REPORTED IMMEDIATELY: ?*FEVER GREATER THAN 100.4 F (38 ?C) OR HIGHER ?*CHILLS OR SWEATING ?*NAUSEA AND VOMITING THAT IS NOT CONTROLLED WITH YOUR NAUSEA MEDICATION ?*UNUSUAL SHORTNESS OF BREATH ?*UNUSUAL BRUISING OR BLEEDING ?*URINARY PROBLEMS (pain or burning when urinating, or frequent urination) ?*BOWEL PROBLEMS (unusual diarrhea, constipation, pain near the anus) ?TENDERNESS IN MOUTH AND THROAT WITH OR WITHOUT PRESENCE OF ULCERS (sore throat, sores in mouth, or a toothache) ?UNUSUAL RASH, SWELLING OR PAIN  ?UNUSUAL VAGINAL DISCHARGE OR ITCHING  ? ?Items with * indicate a potential emergency and should be followed up as soon as possible or go to the Emergency Department if any problems should occur. ? ?Please show the CHEMOTHERAPY ALERT CARD or IMMUNOTHERAPY ALERT CARD at  check-in to the Emergency Department and triage nurse. ? ?Should you have questions after your visit or need to cancel or reschedule your appointment, please contact [facility]  Dept: [phone]  and follow the prompts.  Office hours are 8:00 a.m. to 4:30 p.m. Monday - Friday. Please note that voicemails left after 4:00 p.m. may not be returned until the following business day.  We are closed weekends and major holidays. You have access to a nurse at all times for urgent questions. Please call the main number to the clinic Dept: [phone] and follow the prompts. ? ? ?For any non-urgent questions, you may also contact your provider using MyChart. We now offer e-Visits for anyone 18 and older to request care online for non-urgent symptoms. For details visit [email or web address]. ?  ?Also download the MyChart app! Go to the app store, search "MyChart", open the app, select [place], and log in with your MyChart username and password. ? ?Due to Covid, a mask is required upon entering the hospital/clinic. If you do not have a mask, one will be given to you upon arrival. For doctor visits, patients may have 1 support person aged 18 or older with them. For treatment visits, patients cannot have anyone with them due to current Covid guidelines and our immunocompromised population.  ? ?

## 2022-01-09 NOTE — Telephone Encounter (Signed)
Sch per 2/28 secur chat left msg ?

## 2022-01-09 NOTE — Telephone Encounter (Signed)
Received message from patient stating that her appt for 3/1 was at 8 am but in her mychart it wasn't there anymore.  Returned call and left message that her appt was 3/1 today but at 130 for lab then her infusion. ?

## 2022-01-09 NOTE — Progress Notes (Signed)
Clarified with Dr. Lindi Adie and he would like to give trastuzumab 6 mg/kg  ? ?Larene Beach, PharmD ? ?

## 2022-01-10 ENCOUNTER — Other Ambulatory Visit: Payer: Self-pay | Admitting: *Deleted

## 2022-01-10 DIAGNOSIS — Z86718 Personal history of other venous thrombosis and embolism: Secondary | ICD-10-CM

## 2022-01-10 DIAGNOSIS — C50511 Malignant neoplasm of lower-outer quadrant of right female breast: Secondary | ICD-10-CM

## 2022-01-10 LAB — CANCER ANTIGEN 27.29: CA 27.29: 85.5 U/mL — ABNORMAL HIGH (ref 0.0–38.6)

## 2022-01-10 NOTE — Progress Notes (Signed)
Orders placed for INR collection and to be routed to Dr. Eunice Blase. ?

## 2022-01-14 ENCOUNTER — Other Ambulatory Visit: Payer: Self-pay | Admitting: Rheumatology

## 2022-01-14 MED ORDER — AMPHETAMINE-DEXTROAMPHET ER 20 MG PO CP24
20.0000 mg | ORAL_CAPSULE | Freq: Every day | ORAL | 0 refills | Status: DC
Start: 2022-01-14 — End: 2022-02-11

## 2022-01-14 NOTE — Telephone Encounter (Signed)
Next Visit: 03/21/2022 ? ?Last Visit: 12/19/2021 ? ?Last Fill: 12/14/2021 ? ?Dx: Primary insomnia ? ?Current Dose per office note on 12/19/2021: not discussed ? ?Okay to refill Adderall?   ?

## 2022-01-14 NOTE — Telephone Encounter (Signed)
Patient called the office requesting a refill of Adderall to be sent to [facility] on [address]. 

## 2022-01-15 ENCOUNTER — Ambulatory Visit: Payer: 59 | Admitting: Adult Health

## 2022-01-15 ENCOUNTER — Ambulatory Visit: Payer: 59

## 2022-01-15 ENCOUNTER — Other Ambulatory Visit: Payer: 59

## 2022-01-17 ENCOUNTER — Other Ambulatory Visit: Payer: Self-pay

## 2022-01-17 ENCOUNTER — Ambulatory Visit (HOSPITAL_COMMUNITY)
Admission: RE | Admit: 2022-01-17 | Discharge: 2022-01-17 | Disposition: A | Discharge status: Home / Self Care | Payer: 59 | Source: Ambulatory Visit | Attending: Hematology and Oncology | Admitting: Hematology and Oncology

## 2022-01-17 DIAGNOSIS — C50511 Malignant neoplasm of lower-outer quadrant of right female breast: Secondary | ICD-10-CM | POA: Diagnosis present

## 2022-01-17 DIAGNOSIS — C787 Secondary malignant neoplasm of liver and intrahepatic bile duct: Secondary | ICD-10-CM | POA: Diagnosis present

## 2022-01-17 DIAGNOSIS — Z170 Estrogen receptor positive status [ER+]: Secondary | ICD-10-CM | POA: Insufficient documentation

## 2022-01-17 MED ORDER — IOHEXOL 300 MG/ML  SOLN
80.0000 mL | Freq: Once | INTRAMUSCULAR | Status: AC | PRN
  Administered 2022-01-17: 10:00:00 80 mL via INTRAVENOUS

## 2022-01-17 MED ORDER — SODIUM CHLORIDE (PF) 0.9 % IJ SOLN
INTRAMUSCULAR | Status: AC
  Filled 2022-01-17: qty 50

## 2022-01-18 ENCOUNTER — Encounter (HOSPITAL_COMMUNITY): Payer: Self-pay | Admitting: Cardiology

## 2022-01-18 ENCOUNTER — Other Ambulatory Visit (HOSPITAL_COMMUNITY): Payer: 59

## 2022-01-18 ENCOUNTER — Ambulatory Visit (HOSPITAL_COMMUNITY)
Admission: RE | Admit: 2022-01-18 | Discharge: 2022-01-18 | Disposition: A | Discharge status: Home / Self Care | Payer: 59 | Source: Ambulatory Visit | Attending: Cardiology | Admitting: Cardiology

## 2022-01-18 VITALS — BP 110/60 | HR 73

## 2022-01-18 DIAGNOSIS — Z853 Personal history of malignant neoplasm of breast: Secondary | ICD-10-CM | POA: Diagnosis not present

## 2022-01-18 DIAGNOSIS — M329 Systemic lupus erythematosus, unspecified: Secondary | ICD-10-CM | POA: Diagnosis not present

## 2022-01-18 DIAGNOSIS — Z170 Estrogen receptor positive status [ER+]: Secondary | ICD-10-CM

## 2022-01-18 DIAGNOSIS — Z86718 Personal history of other venous thrombosis and embolism: Secondary | ICD-10-CM | POA: Insufficient documentation

## 2022-01-18 DIAGNOSIS — C50511 Malignant neoplasm of lower-outer quadrant of right female breast: Secondary | ICD-10-CM

## 2022-01-18 DIAGNOSIS — Z7901 Long term (current) use of anticoagulants: Secondary | ICD-10-CM | POA: Insufficient documentation

## 2022-01-18 DIAGNOSIS — Z86711 Personal history of pulmonary embolism: Secondary | ICD-10-CM | POA: Insufficient documentation

## 2022-01-18 DIAGNOSIS — C787 Secondary malignant neoplasm of liver and intrahepatic bile duct: Secondary | ICD-10-CM | POA: Diagnosis not present

## 2022-01-18 DIAGNOSIS — I700 Atherosclerosis of aorta: Secondary | ICD-10-CM | POA: Insufficient documentation

## 2022-01-18 NOTE — Patient Instructions (Signed)
Thank you for your visit today. ? ?Your physician recommends that you schedule a follow-up appointment in: AS needed. ? ?If you have any questions or concerns before your next appointment please send [place] a message through [place] or call our office at [phone].   ? ?TO LEAVE A MESSAGE FOR THE NURSE SELECT OPTION 2, PLEASE LEAVE A MESSAGE INCLUDING: ?YOUR NAME ?DATE OF BIRTH ?CALL BACK NUMBER ?REASON FOR CALL**this is important as we prioritize the call backs ? ?YOU WILL RECEIVE A CALL BACK THE SAME DAY AS LONG AS YOU CALL BEFORE 4:00 PM ? ?At the [facility], you and your health needs are our priority. As part of our continuing mission to provide you with exceptional heart care, we have created designated Provider Care Teams. These Care Teams include your primary Cardiologist (physician) and Advanced Practice Providers (APPs- Physician Assistants and Nurse Practitioners) who all work together to provide you with the care you need, when you need it.  ? ?You may see any of the following providers on your designated Care Team at your next follow up: ?Dr Glori Bickers ?Dr Loralie Champagne ?Darrick Grinder, NP ?Lyda Jester, PA ?Jessica [place],NP ?Marlyce Huge, PA ?Audry Riles, PharmD ? ? ?Please be sure to bring in all your medications bottles to every appointment.  ? ? ?

## 2022-01-19 NOTE — Progress Notes (Signed)
Oncology: Dr. Lindi Adie ? ?56 y.o. with history of SLE, PE, APLAS, and metastatic breast cancer present for cardio-oncology evaluation.  Patient is on chronic warfarin for antiphospholipid antibody syndrome and history of PE and DVT.  She has breast cancer with liver metastases.  Breast cancer is triple positive, she is currently on long-term Herceptin treatment.  She has been getting regular screening echoes over the last 1.5 years.  I looked back over her past echoes including the echo from 2/23.  These all appear basically normal.  She has been concerned about variances in the degree of valvular regurgitation, which have been graded from none to mild for the various valves.  Nothing clinically significant.  She has also been concerned about aortic calcification noted on her last CT chest.  I reviewed this CT, there is some calcification at the proximal descending thoracic aorta, suspect this is due to flow dynamics (impact on the wall at a site where the vessel turns downward).  She has no coronary calcification.  ? ?She generally feels well.  No exertional dyspnea or chest pain.  She works a [employer].  No significant exercise limitation.  ? ?ECG: NSR, normal (personally reviewed) ? ?PMH: ?1. SLE ?2. COVID-19 1/23 ?3. H/o PE: Has antiphospholipid antibody syndrome.  ?4. Breast cancer: Stage IV, ER+/PR+/HER2+.  Liver mets.   ?- She is currently on Herceptin maintenance therapy.  ?- Echo (2/23): EF 65%, GLS -22%, normal RV ? ?Social History  ? ?Socioeconomic History  ? Marital status: Widowed  ?  Spouse name: Not on file  ? Number of children: Not on file  ? Years of education: Not on file  ? Highest education level: Not on file  ?Occupational History  ? Not on file  ?Tobacco Use  ? Smoking status: Former  ?  Packs/day: 0.50  ?  Years: 5.00  ?  Pack years: 2.50  ?  Types: Cigarettes  ?  Start date: 08/06/2006  ?  Quit date: 08/07/2011  ?  Years since quitting: 10.4  ?  Passive exposure: Never  ? Smokeless tobacco: Never   ?Vaping Use  ? Vaping Use: Never used  ?Substance and Sexual Activity  ? Alcohol use: Yes  ?  Comment: occasionally  ? Drug use: No  ? Sexual activity: Yes  ?  Birth control/protection: Pill  ?Other Topics Concern  ? Not on file  ?Social History Narrative  ? Not on file  ? ?Social Determinants of Health  ? ?Financial Resource Strain: Not on file  ?Food Insecurity: Not on file  ?Transportation Needs: Not on file  ?Physical Activity: Not on file  ?Stress: Not on file  ?Social Connections: Not on file  ?Intimate Partner Violence: Not on file  ? ?Family History  ?Problem Relation Age of Onset  ? Rheum arthritis Father   ? Hemachromatosis Father   ? Heart Problems Father   ? Cancer Sister   ?     bone   ? Colon cancer Neg Hx   ? Stomach cancer Neg Hx   ? Esophageal cancer Neg Hx   ? Pancreatic cancer Neg Hx   ? Liver disease Neg Hx   ? ?ROS: All systems reviewed and negative except as per HPI.  ? ?General: NAD ?Neck: No JVD, no thyromegaly or thyroid nodule.  ?Lungs: Clear to auscultation bilaterally with normal respiratory effort. ?CV: Nondisplaced PMI.  Heart regular S1/S2, no S3/S4, no murmur.  No peripheral edema.  No carotid bruit.  Normal pedal pulses.  ?  Abdomen: Soft, nontender, no hepatosplenomegaly, no distention.  ?Skin: Intact without lesions or rashes.  ?Neurologic: Alert and oriented x 3.  ?Psych: Normal affect. ?Extremities: No clubbing or cyanosis.  ?HEENT: Normal. left ? ?Assessment/Plan: ?1. Herceptin use: I reviewed severe prior echoes, no major abnormalities noted. She has no evidence for Herceptin-induced cardiotoxicity.  ?- She has been on Herceptin for >1.5 yrs with no abnormalities, think she can change echo screening to every 6 months.  ?2. Aortic atherosclerosis: Only noted at a location where there is liable to be flow impact as the vessel changes direction.  She has no coronary artery calcium noted on review of her last chest CT.  I do not think that she is of high vascular risk.   ?- Would  check lipids. ? ?She can followup prn.  ? ?Loralie Champagne ?01/19/2022 ? ?

## 2022-01-30 ENCOUNTER — Inpatient Hospital Stay: Payer: 59

## 2022-01-30 ENCOUNTER — Inpatient Hospital Stay: Payer: 59 | Admitting: Hematology and Oncology

## 2022-02-05 NOTE — Assessment & Plan Note (Signed)
08/04/2020: Right breast biopsy T2 N0 M1 stage IV IDC grade 3, triple positive with a Ki-67 of 40%, multiple liver lesions biopsy was positive for breast cancer ER/PR positive HER2 negative with a Ki-67 30% ?08/31/2020-12/14/2020: TCHP x6 ?? ?Current treatment: Herceptin maintenance every 4 weeks, anastrozole and palbociclib (started 01/29/2021) ?Treatment toxicities: ?1. Severe constipation: She had constipation problems even prior to starting treatments.  She is using Dulcolax as needed ?2. Gastritis: I sent a prescription for Pepcid ?? ?COVID infection January 2023: Held Erica Wood and Herceptin treatments.  We are resuming her treatments today. ?History of lupus: Sees rheumatology ?? ?Joint swelling: I switched her from anastrozole to letrozole. ? ?01/17/2022: CT CAP: Very subtle metastatic lesion of the caudate lobe is unchanged.  No new metastatic disease elsewhere. ?? ?Recommendation:  Continue with current management ?Return to clinic after the CT scan to discuss results. ?

## 2022-02-05 NOTE — Progress Notes (Signed)
? ?Patient Care Team: ?Eunice Blase, MD as PCP - General (Family Medicine) ?Fannie Knee, MD as Referring Physician (Oncology) ?Rockwell Germany, RN as Oncology Nurse Navigator ?Mauro Kaufmann, RN as Oncology Nurse Navigator ?Rolm Bookbinder, MD as Consulting Physician (General Surgery) ?Kyung Rudd, MD as Consulting Physician (Radiation Oncology) ?Bo Merino, MD as Consulting Physician (Rheumatology) ?Olga Millers, MD as Consulting Physician (Obstetrics and Gynecology) ?Larey Dresser, MD as Consulting Physician (Cardiology) ?Armbruster, Carlota Raspberry, MD as Consulting Physician (Gastroenterology) ?Nena Jordan, MD as Referring Physician (Ophthalmology) ?Nena Jordan, MD as Referring Physician (Ophthalmology) ?Nicholas Lose, MD as Consulting Physician (Hematology and Oncology) ? ?DIAGNOSIS:  ?Encounter Diagnosis  ?Name Primary?  ? Malignant neoplasm of lower-outer quadrant of right breast of female, estrogen receptor positive ([place])   ? ? ?SUMMARY OF ONCOLOGIC HISTORY: ?Oncology History  ?Malignant neoplasm of lower-outer quadrant of right breast of female, estrogen receptor positive ([place])  ?08/04/2020 Initial Diagnosis  ? Right breast lower outer quadrant biopsy 08/04/2020 for a clinical T2 N0 M1, stage IV invasive ductal carcinoma, grade 3, triple positive, with an MIB-1 of 40%. abdominal MRI 08/24/2020 shows multiple liver lesions highly suspicious for liver metastases. ?Liver biopsy 09/20/2020 positive for carcinoma, prognostic panel estrogen and progesterone receptor positive, but HER-2 negative (0); MIB-1 of 30% ?  ?08/31/2020 - 12/14/2020 Chemotherapy  ? Neoadjuvant chemotherapy consisting of docetaxel, carboplatin, trastuzumab and pertuzumab every 21 days x 6  ?  ?12/26/2020 Imaging  ? breast MRI 12/26/2020 showed right breast index lesion decreased to 0.8 cm; 2 additional masses in the right breast stable ?abdominal MRI 01/29/2021 shows no active disease in the liver ?  ?  Chemotherapy  ? Herceptin Maintenance initially q 3 weeks, now q 4 weeks ?  ?01/29/2021 -  Anti-estrogen oral therapy  ? anastrozole and palbociclib  ?  ?Liver metastases ([place])  ?01/03/2021 Initial Diagnosis  ? Liver metastases ([place]) ?  ?01/03/2021 -  Chemotherapy  ? Patient is on Treatment Plan : BREAST Trastuzumab q21d  ?   ? ? ?CHIEF COMPLIANT:  triple positive stage IV breast cancer ? ?INTERVAL HISTORY: Erica Wood is a  56 y.o. with above-mentioned history of triple positive stage IV breast cancer, currently on chemotherapy with Herceptin. She presents to the clinic today for treatment. ? ? ?ALLERGIES:  is allergic to beef-derived products, latex, sulfa antibiotics, and tape. ? ?MEDICATIONS:  ?Current Outpatient Medications  ?Medication Sig Dispense Refill  ? acetaminophen (TYLENOL) 500 MG tablet Take 1,000 mg by mouth every 6 (six) hours as needed for moderate pain or headache.    ? amphetamine-dextroamphetamine (ADDERALL XR) 20 MG 24 hr capsule Take 1 capsule (20 mg total) by mouth daily. 30 capsule 0  ? Ascorbic Acid (VITAMIN C PO) Take 3,000 mg by mouth daily.    ? Cholecalciferol (VITAMIN D3) 75 MCG (3000 UT) TABS Take 3,000 Units by mouth daily.    ? Clindamycin-Benzoyl Per, Refr, gel Apply topically.    ? hydroxychloroquine (PLAQUENIL) 200 MG tablet Take 1 tablet (200 mg total) by mouth daily. 90 tablet 4  ? IBRANCE 100 MG tablet TAKE 1 TABLET BY MOUTH ONCE DAILY WITH OR WITHOUT FOOD  FOR 21 DAYS, FOLLOWED BY 7  DAYS OFF, REPEATED EVERY 28 DAYS 21 tablet 6  ? letrozole (FEMARA) 2.5 MG tablet Take 1 tablet (2.5 mg total) by mouth daily. 90 tablet 3  ? lidocaine-prilocaine (EMLA) cream Apply topically.    ? metoCLOPramide (REGLAN) 10 MG tablet Take 5-10  mg by mouth as needed.    ? spironolactone (ALDACTONE) 25 MG tablet Take 25 mg by mouth daily as needed (swelling).    ? traMADol (ULTRAM) 50 MG tablet Take 1-2 tablets (50-100 mg total) by mouth every 6 (six) hours as needed. 60 tablet 0  ? tretinoin  (RETIN-A) 0.1 % cream Apply 1 application topically at bedtime as needed (acne).     ? triamterene-hydrochlorothiazide (DYAZIDE) 37.5-25 MG capsule Take 1 each (1 capsule total) by mouth daily. 90 capsule 1  ? warfarin (COUMADIN) 5 MG tablet TAKE 1 TABLET(5 MG) BY MOUTH DAILY 30 tablet PRN  ? ?No current facility-administered medications for this visit.  ? ? ?PHYSICAL EXAMINATION: ?ECOG PERFORMANCE STATUS: 1 - Symptomatic but completely ambulatory ? ?Vitals:  ? 02/06/22 0855  ?BP: 110/70  ?Pulse: 65  ?Resp: 17  ?Temp: 97.9 ?F (36.6 ?C)  ?SpO2: 100%  ? ?Filed Weights  ? 02/06/22 0855  ?Weight: 113 lb 3.2 oz (51.3 kg)  ? ?  ? ?LABORATORY DATA:  ?I have reviewed the data as listed ? ?  Latest Ref Rng & Units 01/09/2022  ?  2:11 PM 12/20/2021  ?  8:50 AM 12/19/2021  ? 11:54 AM  ?CMP  ?Glucose 70 - 99 mg/dL 86   92   81    ?BUN 6 - 20 mg/dL _0 ?Creatinine 0.44 - 1.00 mg/dL 0.76   0.77   0.75    ?Sodium 135 - 145 mmol/L 138   138   139    ?Potassium 3.5 - 5.1 mmol/L 3.7   3.9   4.7    ?Chloride 98 - 111 mmol/L 106   106   101    ?CO2 22 - 32 mmol/L _1 ?Calcium 8.9 - 10.3 mg/dL 8.5   9.2   9.2    ?Total Protein 6.5 - 8.1 g/dL 6.5   6.7   6.5    ?Total Bilirubin 0.3 - 1.2 mg/dL 0.4   0.4   0.3    ?Alkaline Phos 38 - 126 U/L 85   109   126    ?AST 15 - 41 U/L _2 ?ALT 0 - 44 U/L _3 ? ? ?Lab Results  ?Component Value Date  ? WBC 2.7 (L) 02/06/2022  ? HGB 11.4 (L) 02/06/2022  ? HCT 33.8 (L) 02/06/2022  ? MCV 96.8 02/06/2022  ? PLT 137 (L) 02/06/2022  ? NEUTROABS PENDING 02/06/2022  ? ? ?ASSESSMENT & PLAN:  ?Malignant neoplasm of lower-outer quadrant of right breast of female, estrogen receptor positive ([place]) ?08/04/2020: Right breast biopsy T2 N0 M1 stage IV IDC grade 3, triple positive with a Ki-67 of 40%, multiple liver lesions biopsy was positive for breast cancer ER/PR positive HER2 negative with a Ki-67 30% ?08/31/2020-12/14/2020: TCHP x6 ?  ?Current treatment: Herceptin  maintenance every 4 weeks, letrozole and palbociclib (started 01/29/2021) ?Treatment toxicities: ?Severe constipation: She had constipation problems even prior to starting treatments.  She is using Dulcolax as needed ?Gastritis: I sent a prescription for Pepcid ?  ?COVID infection January 2023: Held [place] and Herceptin treatments.  We are resuming her treatments today. ?History of lupus: Sees rheumatology ?  ?Joint swelling: improved with letrozole ? ?01/17/2022: CT CAP: Very subtle metastatic lesion of the caudate lobe is unchanged.  No new  metastatic disease elsewhere. ?  ?Recommendation:  Continue with current management ? ?Continue with the current treatment plan of Herceptin and letrozole and Ibrance. ?Return to clinic monthly for Herceptin and every other month for follow-up with me. ?We decided to continue with the current treatment plan for until the next scan and then decide to de-escalate her treatment.  We might consider dropping the Ibrance first. ? ? ?No orders of the defined types were placed in this encounter. ? ?The patient has a good understanding of the overall plan. she agrees with it. she will call with any problems that may develop before the next visit here. ?Total time spent: 30 mins including face to face time and time spent for planning, charting and co-ordination of care ? ? Harriette Ohara, MD ?02/06/22 ? ? ? I Gardiner Coins am scribing for Dr. Lindi Adie ? ?I have reviewed the above documentation for accuracy and completeness, and I agree with the above. ?  ?

## 2022-02-06 ENCOUNTER — Other Ambulatory Visit: Payer: 59

## 2022-02-06 ENCOUNTER — Inpatient Hospital Stay: Payer: 59

## 2022-02-06 ENCOUNTER — Inpatient Hospital Stay: Payer: 59 | Admitting: Hematology and Oncology

## 2022-02-06 ENCOUNTER — Other Ambulatory Visit: Payer: Self-pay

## 2022-02-06 DIAGNOSIS — C787 Secondary malignant neoplasm of liver and intrahepatic bile duct: Secondary | ICD-10-CM

## 2022-02-06 DIAGNOSIS — Z5112 Encounter for antineoplastic immunotherapy: Secondary | ICD-10-CM | POA: Diagnosis not present

## 2022-02-06 DIAGNOSIS — Z86718 Personal history of other venous thrombosis and embolism: Secondary | ICD-10-CM

## 2022-02-06 DIAGNOSIS — Z95828 Presence of other vascular implants and grafts: Secondary | ICD-10-CM

## 2022-02-06 DIAGNOSIS — C50511 Malignant neoplasm of lower-outer quadrant of right female breast: Secondary | ICD-10-CM

## 2022-02-06 DIAGNOSIS — Z170 Estrogen receptor positive status [ER+]: Secondary | ICD-10-CM

## 2022-02-06 LAB — CMP (CANCER CENTER ONLY)
ALT: 14 U/L (ref 0–44)
AST: 18 U/L (ref 15–41)
Albumin: 4.2 g/dL (ref 3.5–5.0)
Alkaline Phosphatase: 98 U/L (ref 38–126)
Anion gap: <3 — ABNORMAL LOW (ref 5–15)
BUN: 12 mg/dL (ref 6–20)
CO2: 27 mmol/L (ref 22–32)
Calcium: 8.9 mg/dL (ref 8.9–10.3)
Chloride: 110 mmol/L (ref 98–111)
Creatinine: 0.85 mg/dL (ref 0.44–1.00)
GFR, Estimated: >60 mL/min (ref >60)
Glucose, Bld: 88 mg/dL (ref 70–99)
Potassium: 4 mmol/L (ref 3.5–5.1)
Sodium: 138 mmol/L (ref 135–145)
Total Bilirubin: 0.3 mg/dL (ref 0.3–1.2)
Total Protein: 6.7 g/dL (ref 6.5–8.1)

## 2022-02-06 LAB — CBC WITH DIFFERENTIAL (CANCER CENTER ONLY)
Abs Immature Granulocytes: 0 10*3/uL (ref 0.00–0.07)
Basophils Absolute: 0.1 10*3/uL (ref 0.0–0.1)
Basophils Relative: 2 %
Eosinophils Absolute: 0 10*3/uL (ref 0.0–0.5)
Eosinophils Relative: 1 %
HCT: 33.8 % — ABNORMAL LOW (ref 36.0–46.0)
Hemoglobin: 11.4 g/dL — ABNORMAL LOW (ref 12.0–15.0)
Immature Granulocytes: 0 %
Lymphocytes Relative: 47 %
Lymphs Abs: 1.3 10*3/uL (ref 0.7–4.0)
MCH: 32.7 pg (ref 26.0–34.0)
MCHC: 33.7 g/dL (ref 30.0–36.0)
MCV: 96.8 fL (ref 80.0–100.0)
Monocytes Absolute: 0.2 10*3/uL (ref 0.1–1.0)
Monocytes Relative: 7 %
Neutro Abs: 1.1 10*3/uL — ABNORMAL LOW (ref 1.7–7.7)
Neutrophils Relative %: 43 %
Platelet Count: 137 10*3/uL — ABNORMAL LOW (ref 150–400)
RBC: 3.49 MIL/uL — ABNORMAL LOW (ref 3.87–5.11)
RDW: 16.2 % — ABNORMAL HIGH (ref 11.5–15.5)
WBC Count: 2.7 10*3/uL — ABNORMAL LOW (ref 4.0–10.5)
nRBC: 0 % (ref 0.0–0.2)

## 2022-02-06 LAB — PROTIME-INR
INR: 2 — ABNORMAL HIGH (ref 0.8–1.2)
Prothrombin Time: 22.7 seconds — ABNORMAL HIGH (ref 11.4–15.2)

## 2022-02-06 MED ORDER — SODIUM CHLORIDE 0.9% FLUSH
10.0000 mL | Freq: Once | INTRAVENOUS | Status: AC
  Administered 2022-02-06: 10 mL

## 2022-02-06 MED ORDER — TRASTUZUMAB-ANNS CHEMO 150 MG IV SOLR
8.0000 mg/kg | Freq: Once | INTRAVENOUS | Status: AC
  Administered 2022-02-06: 420 mg via INTRAVENOUS
  Filled 2022-02-06: qty 20

## 2022-02-06 MED ORDER — HEPARIN SOD (PORK) LOCK FLUSH 100 UNIT/ML IV SOLN
500.0000 [IU] | Freq: Once | INTRAVENOUS | Status: AC | PRN
  Administered 2022-02-06: 500 [IU]

## 2022-02-06 MED ORDER — ACETAMINOPHEN 325 MG PO TABS
650.0000 mg | ORAL_TABLET | Freq: Once | ORAL | Status: AC
  Administered 2022-02-06: 650 mg via ORAL
  Filled 2022-02-06: qty 2

## 2022-02-06 MED ORDER — SODIUM CHLORIDE 0.9 % IV SOLN: Freq: Once | INTRAVENOUS | Status: AC

## 2022-02-06 MED ORDER — DIPHENHYDRAMINE HCL 25 MG PO CAPS
25.0000 mg | ORAL_CAPSULE | Freq: Once | ORAL | Status: AC
  Administered 2022-02-06: 25 mg via ORAL
  Filled 2022-02-06: qty 1

## 2022-02-06 MED ORDER — SODIUM CHLORIDE 0.9% FLUSH
10.0000 mL | INTRAVENOUS | Status: DC | PRN
  Administered 2022-02-06: 10 mL

## 2022-02-06 NOTE — Progress Notes (Signed)
No lab parameters for today's treatment, but patient's ANC of 1.1 was mentioned to Dr. Lindi Adie and he stated that he was comfortable with proceeding with treatment. ?

## 2022-02-06 NOTE — Patient Instructions (Signed)
[facility] MEDICAL [facility]COLOG[facility]   ?Discharge Instructions: ?Thank you for choosing [facility] to provide your oncology and hematology care.  ? ?If you have a lab appointment with the Cancer Center, please go directly to the Cancer Center and check in at the registration area. ?  ?Wear comfortable clothing and clothing appropriate for easy access to any Portacath or PICC line.  ? ?We strive to give you quality time with your provider. You may need to reschedule your appointment if you arrive late (15 or more minutes).  Arriving late affects you and other patients whose appointments are after yours.  Also, if you miss three or more appointments without notifying the office, you may be dismissed from the clinic at the provider?s discretion.    ?  ?For prescription refill requests, have your pharmacy contact our office and allow 72 hours for refills to be completed.   ? ?Today you received the following chemotherapy and/or immunotherapy agents: trastuzumab-anns    ?  ?To help prevent nausea and vomiting after your treatment, we encourage you to take your nausea medication as directed. ? ?BELOW ARE SYMPTOMS THAT SHOULD BE REPORTED IMMEDIATELY: ?*FEVER GREATER THAN 100.4 F (38 ?C) OR HIGHER ?*CHILLS OR SWEATING ?*NAUSEA AND VOMITING THAT IS NOT CONTROLLED WITH YOUR NAUSEA MEDICATION ?*UNUSUAL SHORTNESS OF BREATH ?*UNUSUAL BRUISING OR BLEEDING ?*URINARY PROBLEMS (pain or burning when urinating, or frequent urination) ?*BOWEL PROBLEMS (unusual diarrhea, constipation, pain near the anus) ?TENDERNESS IN MOUTH AND THROAT WITH OR WITHOUT PRESENCE OF ULCERS (sore throat, sores in mouth, or a toothache) ?UNUSUAL RASH, SWELLING OR PAIN  ?UNUSUAL VAGINAL DISCHARGE OR ITCHING  ? ?Items with * indicate a potential emergency and should be followed up as soon as possible or go to the Emergency Department if any problems should occur. ? ?Please show the CHEMOTHERAPY ALERT CARD or IMMUNOTHERAPY ALERT CARD at  check-in to the Emergency Department and triage nurse. ? ?Should you have questions after your visit or need to cancel or reschedule your appointment, please contact [facility]  Dept: [phone]  and follow the prompts.  Office hours are 8:00 a.m. to 4:30 p.m. Monday - Friday. Please note that voicemails left after 4:00 p.m. may not be returned until the following business day.  We are closed weekends and major holidays. You have access to a nurse at all times for urgent questions. Please call the main number to the clinic Dept: [phone] and follow the prompts. ? ? ?For any non-urgent questions, you may also contact your provider using MyChart. We now offer e-Visits for anyone 18 and older to request care online for non-urgent symptoms. For details visit [email or web address]. ?  ?Also download the MyChart app! Go to the app store, search "MyChart", open the app, select [facility], and log in with your MyChart username and password. ? ?Due to Covid, a mask is required upon entering the hospital/clinic. If you do not have a mask, one will be given to you upon arrival. For doctor visits, patients may have 1 support person aged 18 or older with them. For treatment visits, patients cannot have anyone with them due to current Covid guidelines and our immunocompromised population.  ? ?

## 2022-02-07 ENCOUNTER — Ambulatory Visit: Payer: 59

## 2022-02-07 LAB — CANCER ANTIGEN 27.29: CA 27.29: 79.4 U/mL — ABNORMAL HIGH (ref 0.0–38.6)

## 2022-02-10 ENCOUNTER — Encounter: Payer: Self-pay | Admitting: Hematology and Oncology

## 2022-02-11 ENCOUNTER — Other Ambulatory Visit: Payer: Self-pay

## 2022-02-11 MED ORDER — AMPHETAMINE-DEXTROAMPHET ER 20 MG PO CP24
20.0000 mg | ORAL_CAPSULE | Freq: Every day | ORAL | 0 refills | Status: DC
Start: 2022-02-11 — End: 2022-03-08

## 2022-02-11 NOTE — Telephone Encounter (Signed)
Patient called requesting prescription refill of Adderall to be sent to [facility] at [address].   

## 2022-02-11 NOTE — Telephone Encounter (Signed)
Next Visit: 03/21/2022 ?  ?Last Visit: 12/19/2021 ?  ?Last Fill: 01/14/2022 ?  ?Dx: Primary insomnia ?  ?Current Dose per office note on 12/19/2021: not discussed ?  ?Okay to refill Adderall?   ?

## 2022-02-22 ENCOUNTER — Telehealth: Payer: Self-pay | Admitting: Adult Health

## 2022-02-22 NOTE — Telephone Encounter (Signed)
Scheduled appointment per 3/29 los. Patient is aware. ?

## 2022-02-28 ENCOUNTER — Other Ambulatory Visit: Payer: Self-pay | Admitting: *Deleted

## 2022-02-28 DIAGNOSIS — C50511 Malignant neoplasm of lower-outer quadrant of right female breast: Secondary | ICD-10-CM

## 2022-03-04 ENCOUNTER — Encounter: Payer: 59 | Admitting: Physician Assistant

## 2022-03-05 ENCOUNTER — Inpatient Hospital Stay: Payer: 59

## 2022-03-05 ENCOUNTER — Other Ambulatory Visit: Payer: Self-pay

## 2022-03-05 ENCOUNTER — Inpatient Hospital Stay (HOSPITAL_BASED_OUTPATIENT_CLINIC_OR_DEPARTMENT_OTHER): Payer: 59 | Admitting: Physician Assistant

## 2022-03-05 ENCOUNTER — Other Ambulatory Visit: Payer: Self-pay | Admitting: Hematology and Oncology

## 2022-03-05 ENCOUNTER — Encounter: Payer: Self-pay | Admitting: *Deleted

## 2022-03-05 ENCOUNTER — Inpatient Hospital Stay: Payer: 59 | Attending: Oncology

## 2022-03-05 VITALS — BP 114/66 | HR 62 | Temp 99.7°F | Resp 16

## 2022-03-05 DIAGNOSIS — R519 Headache, unspecified: Secondary | ICD-10-CM | POA: Diagnosis not present

## 2022-03-05 DIAGNOSIS — C787 Secondary malignant neoplasm of liver and intrahepatic bile duct: Secondary | ICD-10-CM | POA: Insufficient documentation

## 2022-03-05 DIAGNOSIS — Z170 Estrogen receptor positive status [ER+]: Secondary | ICD-10-CM | POA: Diagnosis not present

## 2022-03-05 DIAGNOSIS — D709 Neutropenia, unspecified: Secondary | ICD-10-CM | POA: Diagnosis not present

## 2022-03-05 DIAGNOSIS — Z95828 Presence of other vascular implants and grafts: Secondary | ICD-10-CM

## 2022-03-05 DIAGNOSIS — Z86718 Personal history of other venous thrombosis and embolism: Secondary | ICD-10-CM

## 2022-03-05 DIAGNOSIS — C50511 Malignant neoplasm of lower-outer quadrant of right female breast: Secondary | ICD-10-CM | POA: Insufficient documentation

## 2022-03-05 LAB — CMP (CANCER CENTER ONLY)
ALT: 19 U/L (ref 0–44)
AST: 22 U/L (ref 15–41)
Albumin: 4 g/dL (ref 3.5–5.0)
Alkaline Phosphatase: 93 U/L (ref 38–126)
Anion gap: 4 — ABNORMAL LOW (ref 5–15)
BUN: 14 mg/dL (ref 6–20)
CO2: 29 mmol/L (ref 22–32)
Calcium: 8.7 mg/dL — ABNORMAL LOW (ref 8.9–10.3)
Chloride: 107 mmol/L (ref 98–111)
Creatinine: 0.84 mg/dL (ref 0.44–1.00)
GFR, Estimated: >60 mL/min (ref >60)
Glucose, Bld: 97 mg/dL (ref 70–99)
Potassium: 3.8 mmol/L (ref 3.5–5.1)
Sodium: 140 mmol/L (ref 135–145)
Total Bilirubin: 0.4 mg/dL (ref 0.3–1.2)
Total Protein: 6.4 g/dL — ABNORMAL LOW (ref 6.5–8.1)

## 2022-03-05 LAB — URINALYSIS, ROUTINE W REFLEX MICROSCOPIC
Bilirubin Urine: NEGATIVE
Glucose, UA: NEGATIVE mg/dL
Hgb urine dipstick: NEGATIVE
Ketones, ur: NEGATIVE mg/dL
Nitrite: NEGATIVE
Protein, ur: NEGATIVE mg/dL
Specific Gravity, Urine: 1.003 — ABNORMAL LOW (ref 1.005–1.030)
pH: 6 (ref 5.0–8.0)

## 2022-03-05 LAB — CBC WITH DIFFERENTIAL (CANCER CENTER ONLY)
Abs Immature Granulocytes: 0 10*3/uL (ref 0.00–0.07)
Basophils Absolute: 0.1 10*3/uL (ref 0.0–0.1)
Basophils Relative: 3 %
Eosinophils Absolute: 0 10*3/uL (ref 0.0–0.5)
Eosinophils Relative: 0 %
HCT: 33.1 % — ABNORMAL LOW (ref 36.0–46.0)
Hemoglobin: 11.1 g/dL — ABNORMAL LOW (ref 12.0–15.0)
Immature Granulocytes: 0 %
Lymphocytes Relative: 43 %
Lymphs Abs: 1.2 10*3/uL (ref 0.7–4.0)
MCH: 33.3 pg (ref 26.0–34.0)
MCHC: 33.5 g/dL (ref 30.0–36.0)
MCV: 99.4 fL (ref 80.0–100.0)
Monocytes Absolute: 0.2 10*3/uL (ref 0.1–1.0)
Monocytes Relative: 7 %
Neutro Abs: 1.3 10*3/uL — ABNORMAL LOW (ref 1.7–7.7)
Neutrophils Relative %: 47 %
Platelet Count: 172 10*3/uL (ref 150–400)
RBC: 3.33 MIL/uL — ABNORMAL LOW (ref 3.87–5.11)
RDW: 16.5 % — ABNORMAL HIGH (ref 11.5–15.5)
WBC Count: 2.8 10*3/uL — ABNORMAL LOW (ref 4.0–10.5)
nRBC: 0 % (ref 0.0–0.2)

## 2022-03-05 LAB — PROTIME-INR
INR: 1.3 — ABNORMAL HIGH (ref 0.8–1.2)
Prothrombin Time: 16.4 seconds — ABNORMAL HIGH (ref 11.4–15.2)

## 2022-03-05 MED ORDER — HEPARIN SOD (PORK) LOCK FLUSH 100 UNIT/ML IV SOLN
500.0000 [IU] | Freq: Once | INTRAVENOUS | Status: AC | PRN
  Administered 2022-03-05: 500 [IU]

## 2022-03-05 MED ORDER — ACETAMINOPHEN 325 MG PO TABS: 650.0000 mg | ORAL_TABLET | Freq: Once | ORAL | Status: DC

## 2022-03-05 MED ORDER — DIPHENHYDRAMINE HCL 25 MG PO CAPS: 25.0000 mg | ORAL_CAPSULE | Freq: Once | ORAL | Status: DC

## 2022-03-05 MED ORDER — SODIUM CHLORIDE 0.9 % IV SOLN: Freq: Once | INTRAVENOUS | Status: AC

## 2022-03-05 MED ORDER — TRASTUZUMAB-ANNS CHEMO 150 MG IV SOLR
8.0000 mg/kg | Freq: Once | INTRAVENOUS | Status: DC
  Filled 2022-03-05: qty 20

## 2022-03-05 MED ORDER — SODIUM CHLORIDE 0.9% FLUSH
10.0000 mL | INTRAVENOUS | Status: DC | PRN
  Administered 2022-03-05: 10 mL

## 2022-03-05 MED ORDER — SODIUM CHLORIDE 0.9% FLUSH
10.0000 mL | Freq: Once | INTRAVENOUS | Status: AC
  Administered 2022-03-05: 10 mL

## 2022-03-05 NOTE — Progress Notes (Signed)
Per Anda Kraft, [place] and Dr.Gudena Pt's trtmt will be held today. Pt's PAC deaccessed prior to d/c today. ?This RN received a V/O from [place], [place] for a UA to monitor Pt for UTI.  ?RN educated Pt on obtaining a clean catch urine sample and provided Pt with collection cup and wipe. Pt verbalized understanding for clean catch prior to providing sample.  ?Sample provided at 1615.  ? ?

## 2022-03-05 NOTE — Progress Notes (Signed)
? ? ? ?Symptom Management Consult note ?[name]   ? ?Patient Care Team: ?Eunice Blase, MD as PCP - General (Family Medicine) ?Fannie Knee, MD as Referring Physician (Oncology) ?Rockwell Germany, RN as Oncology Nurse Navigator ?Mauro Kaufmann, RN as Oncology Nurse Navigator ?Rolm Bookbinder, MD as Consulting Physician (General Surgery) ?Kyung Rudd, MD as Consulting Physician (Radiation Oncology) ?Bo Merino, MD as Consulting Physician (Rheumatology) ?Olga Millers, MD as Consulting Physician (Obstetrics and Gynecology) ?Larey Dresser, MD as Consulting Physician (Cardiology) ?Armbruster, Carlota Raspberry, MD as Consulting Physician (Gastroenterology) ?Nena Jordan, MD as Referring Physician (Ophthalmology) ?Nena Jordan, MD as Referring Physician (Ophthalmology) ?Nicholas Lose, MD as Consulting Physician (Hematology and Oncology)  ? ? ?Name of the patient: Erica Wood  [id number]  1966-09-05  ? ?Date of visit: 03/05/2022  ? ? ?Chief complaint/ Reason for visit- headache, fatigue ? ?Oncology History  ?Malignant neoplasm of lower-outer quadrant of right breast of female, estrogen receptor positive ([place])  ?08/04/2020 Initial Diagnosis  ? Right breast lower outer quadrant biopsy 08/04/2020 for a clinical T2 N0 M1, stage IV invasive ductal carcinoma, grade 3, triple positive, with an MIB-1 of 40%. abdominal MRI 08/24/2020 shows multiple liver lesions highly suspicious for liver metastases. ?Liver biopsy 09/20/2020 positive for carcinoma, prognostic panel estrogen and progesterone receptor positive, but HER-2 negative (0); MIB-1 of 30% ?  ?08/31/2020 - 12/14/2020 Chemotherapy  ? Neoadjuvant chemotherapy consisting of docetaxel, carboplatin, trastuzumab and pertuzumab every 21 days x 6  ?  ?12/26/2020 Imaging  ? breast MRI 12/26/2020 showed right breast index lesion decreased to 0.8 cm; 2 additional masses in the right breast stable ?abdominal MRI 01/29/2021 shows no active disease  in the liver ?  ? Chemotherapy  ? Herceptin Maintenance initially q 3 weeks, now q 4 weeks ?  ?01/29/2021 -  Anti-estrogen oral therapy  ? anastrozole and palbociclib  ?  ?Malignant neoplasm metastatic to liver [facility])  ?01/03/2021 Initial Diagnosis  ? Liver metastases ([place]) ? ?  ?01/03/2021 -  Chemotherapy  ? Patient is on Treatment Plan : BREAST Trastuzumab q21d  ? ?  ?  ? ? ?Current Therapy: trastuzumab-anns with most recent treatment on 02/06/22 ? ?Interval history-Erica Wood is a 56 year old female with oncologic history as above seen in infusion clinic today with chief complaint of headache and fatigue since last treatment which was approximately 1 month ago.  She states her symptoms have overall been intermittent.  She states since her last treatment she overall feels worse than usual.  Usually she has some fatigue however recovers from it pretty well she thinks.  Since his most recent treatment she has had intermittent headaches.  Pain is located on the left side of her head.  It feels like headache she has had in the past and the headaches resolve after taking Tylenol.  She denies any associated visual changes, neck pain, gait abnormalities.  She also reports intermittent nausea that resolves when she takes her anti-emetic at home.  She believes she is staying well-hydrated stating that she drinks plenty of fluids during the day without any difficulty.  She states her appetite is overall decreased and she drinks more than she eats.  She denies any sick contacts or known COVID exposures.  Besides the Tylenol no over-the-counter medications tried for symptoms.  She was noted to have a temperature of 99.7 in treatment area today.  Denies any chills, congestion, cough, chest pain, shortness of breath, abdominal pain, vomiting, urinary symptoms,  diarrhea, rash. ? ? ?ROS  ?All other systems are reviewed and are negative for acute change except as noted in the HPI. ? ? ? ?Allergies  ?Allergen Reactions  ?  Beef-Derived Products Hives  ? Latex Rash  ?  Contact  ? Sulfa Antibiotics Rash  ? Tape Rash  ? ? ? ?Past Medical History:  ?Diagnosis Date  ? Anti-cardiolipin antibody positive   ? Anti-cardiolipin antibody syndrome (HCC)   ? Anxiety   ? Arthralgia   ? Arthritis   ? bil knees, hands  ? Atypical chest pain   ? Autoimmune disease ([place])   ? Breast cancer metastasized to liver [facility]) 09/2020  ? Cancer ([place]) 07/2020  ? right breast IDC  ? Chronic fatigue   ? Depression   ? DVT (deep venous thrombosis) ([place])   ? IBS (irritable bowel syndrome)   ? with constipation  ? Lupus ([place])   ? Pulmonary embolus ([place])   ? Raynaud's syndrome   ? Sicca ([place])   ? Thrombocytopenia ([place])   ? ? ? ?Past Surgical History:  ?Procedure Laterality Date  ? ABLATION  12/2018  ? leg veins  ? ABLATION Right 08/2019  ? venous leg   ? APPENDECTOMY    ? COLON SURGERY    ? Perforated colon repair after colostomy  ? EYE SURGERY Bilateral 2022  ? GANGLION CYST EXCISION    ? HYSTEROSCOPY WITH D & C N/A 01/13/2014  ? Procedure: DILATATION AND CURETTAGE /HYSTEROSCOPY with resection ;  Surgeon: Olga Millers, MD;  Location: [facility];  Service: Gynecology;  Laterality: N/A;  ? LAPAROTOMY    ? PORTACATH PLACEMENT N/A 09/13/2020  ? Procedure: INSERTION PORT-A-CATH WITH ULTRASOUND GUIDANCE;  Surgeon: Rolm Bookbinder, MD;  Location: [facility];  Service: General;  Laterality: N/A;  ? ? ?Social History  ? ?Socioeconomic History  ? Marital status: Widowed  ?  Spouse name: Not on file  ? Number of children: Not on file  ? Years of education: Not on file  ? Highest education level: Not on file  ?Occupational History  ? Not on file  ?Tobacco Use  ? Smoking status: Former  ?  Packs/day: 0.50  ?  Years: 5.00  ?  Pack years: 2.50  ?  Types: Cigarettes  ?  Start date: 08/06/2006  ?  Quit date: 08/07/2011  ?  Years since quitting: 10.5  ?  Passive exposure: Never  ? Smokeless tobacco: Never  ?Vaping Use  ? Vaping Use: Never used  ?Substance and Sexual  Activity  ? Alcohol use: Yes  ?  Comment: occasionally  ? Drug use: No  ? Sexual activity: Yes  ?  Birth control/protection: Pill  ?Other Topics Concern  ? Not on file  ?Social History Narrative  ? Not on file  ? ?Social Determinants of Health  ? ?Financial Resource Strain: Not on file  ?Food Insecurity: Not on file  ?Transportation Needs: Not on file  ?Physical Activity: Not on file  ?Stress: Not on file  ?Social Connections: Not on file  ?Intimate Partner Violence: Not on file  ? ? ?Family History  ?Problem Relation Age of Onset  ? Rheum arthritis Father   ? Hemachromatosis Father   ? Heart Problems Father   ? Cancer Sister   ?     bone   ? Colon cancer Neg Hx   ? Stomach cancer Neg Hx   ? Esophageal cancer Neg Hx   ? Pancreatic cancer Neg Hx   ?  Liver disease Neg Hx   ? ? ? ?Current Outpatient Medications:  ?  acetaminophen (TYLENOL) 500 MG tablet, Take 1,000 mg by mouth every 6 (six) hours as needed for moderate pain or headache., Disp: , Rfl:  ?  amphetamine-dextroamphetamine (ADDERALL XR) 20 MG 24 hr capsule, Take 1 capsule (20 mg total) by mouth daily., Disp: 30 capsule, Rfl: 0 ?  Ascorbic Acid (VITAMIN C PO), Take 3,000 mg by mouth daily., Disp: , Rfl:  ?  Cholecalciferol (VITAMIN D3) 75 MCG (3000 UT) TABS, Take 3,000 Units by mouth daily., Disp: , Rfl:  ?  Clindamycin-Benzoyl Per, Refr, gel, Apply topically., Disp: , Rfl:  ?  hydroxychloroquine (PLAQUENIL) 200 MG tablet, Take 1 tablet (200 mg total) by mouth daily., Disp: 90 tablet, Rfl: 4 ?  IBRANCE 100 MG tablet, TAKE 1 TABLET BY MOUTH ONCE DAILY WITH OR WITHOUT FOOD  FOR 21 DAYS, FOLLOWED BY 7  DAYS OFF, REPEATED EVERY 28 DAYS, Disp: 21 tablet, Rfl: 6 ?  letrozole (FEMARA) 2.5 MG tablet, Take 1 tablet (2.5 mg total) by mouth daily., Disp: 90 tablet, Rfl: 3 ?  lidocaine-prilocaine (EMLA) cream, Apply topically., Disp: , Rfl:  ?  metoCLOPramide (REGLAN) 10 MG tablet, Take 5-10 mg by mouth as needed., Disp: , Rfl:  ?  spironolactone (ALDACTONE) 25 MG  tablet, Take 25 mg by mouth daily as needed (swelling)., Disp: , Rfl:  ?  traMADol (ULTRAM) 50 MG tablet, Take 1-2 tablets (50-100 mg total) by mouth every 6 (six) hours as needed., Disp: 60 tablet, Rfl: 0 ?  tretinoin (

## 2022-03-05 NOTE — Progress Notes (Signed)
Dr Lindi Adie notified of [place] 1.3 and temp of 99.7 along with patient complaint of headaches and generally feeling unwell since her last treatment. Dr Lindi Adie asked that patient be seen by Anda Kraft, [place] with symptom management. Anda Kraft, [place] notified and is currently at chairside to evaluate patient (1521). ?

## 2022-03-05 NOTE — Progress Notes (Signed)
Per MD request RN successfully faxed INR results to pt Warfarin managing PCP Dr. Eunice Blase [phone]).  ?

## 2022-03-06 ENCOUNTER — Inpatient Hospital Stay: Payer: 59

## 2022-03-06 LAB — CANCER ANTIGEN 27.29: CA 27.29: 71.3 U/mL — ABNORMAL HIGH (ref 0.0–38.6)

## 2022-03-07 NOTE — Progress Notes (Signed)
? ?Office Visit Note ? ?Patient: Erica Wood             ?Date of Birth: 09/08/1966           ?MRN: [id number]             ?PCP: Eunice Blase, MD ?Referring: Eunice Blase, MD ?Visit Date: 03/21/2022 ?Occupation: '@GUAROCC'$ @ ? ?Subjective:  ?Pain in multiple joints  ? ?History of Present Illness: Erica Wood is a 56 y.o. female with history of systemic lupus erythematosus and inflammatory arthritis.  She remains on Plaquenil 200 mg 1 tablet by mouth daily. She is tolerating plaquenil without any side effects.  She continues to have chronic pain in multiple joints including both hands and both knees.  She has ongoing swelling in her hands and left knee joint.   ?She denies any discoid lesions at this time.  She has been trying to avoid direct sun exposure.  Her hair has started to grow back.  Her energy level is stable. She has occasional nasal ulcers but denies any oral ulcers.  She has ongoing eye dryness but no mouth dryness. She denies any SOB, pleuritic chest pain, or palpitations.  ? ? ? ?Activities of Daily Living:  ?Patient reports morning stiffness for a few minutes.   ?Patient Denies nocturnal pain.  ?Difficulty dressing/grooming: Denies ?Difficulty climbing stairs: Reports ?Difficulty getting out of chair: Reports ?Difficulty using hands for taps, buttons, cutlery, and/or writing: Reports ? ?Review of Systems  ?Constitutional:  Positive for fatigue.  ?HENT:  Negative for mouth sores, mouth dryness and nose dryness.   ?Eyes:  Positive for dryness. Negative for pain and itching.  ?Respiratory:  Negative for shortness of breath and difficulty breathing.   ?Cardiovascular:  Negative for chest pain and palpitations.  ?Gastrointestinal:  Positive for constipation. Negative for blood in stool and diarrhea.  ?Endocrine: Negative for increased urination.  ?Genitourinary:  Negative for difficulty urinating.  ?Musculoskeletal:  Positive for joint pain, joint pain, joint swelling, myalgias, morning  stiffness, muscle tenderness and myalgias.  ?Skin:  Positive for color change and rash.  ?Allergic/Immunologic: Negative for susceptible to infections.  ?Neurological:  Positive for weakness. Negative for dizziness, numbness, headaches and memory loss.  ?Hematological:  Negative for bruising/bleeding tendency.  ?Psychiatric/Behavioral:  Negative for confusion.   ? ?PMFS History:  ?Patient Active Problem List  ? Diagnosis Date Noted  ? Goals of care, counseling/discussion 05/09/2021  ? Malignant neoplasm metastatic to liver ([place]) 01/03/2021  ? Port-A-Cath in place 11/02/2020  ? Aortic atherosclerosis ([place]) 09/21/2020  ? Malignant neoplasm of lower-outer quadrant of right breast of female, estrogen receptor positive ([place]) 08/10/2020  ? Abnormal cervical Papanicolaou smear 07/12/2020  ? Pulmonary embolism ([place]) 07/12/2020  ? Fibrocystic disease of breast 01/22/2018  ? Primary osteoarthritis of both knees 05/02/2017  ? Raynaud's disease without gangrene 11/26/2016  ? Discoid lupus 11/26/2016  ? Anticardiolipin antibody positive 11/26/2016  ? Autoimmune disease ([place]) 11/25/2016  ? High risk medication use 11/25/2016  ? Primary osteoarthritis of both hands 11/25/2016  ? History of DVT (deep vein thrombosis) 11/25/2016  ? History of pulmonary embolism 11/25/2016  ? Primary insomnia 11/25/2016  ? Anxiety 11/25/2016  ? Irritable bowel syndrome with constipation 11/25/2016  ? Raynaud's phenomenon 01/03/2016  ? Varicose veins of lower extremities with other complications 01/77/9390  ? Spider veins of both lower extremities 05/24/2014  ?  ?Past Medical History:  ?Diagnosis Date  ? Anti-cardiolipin antibody positive   ? Anti-cardiolipin antibody syndrome (HCC)   ?  Anxiety   ? Arthralgia   ? Arthritis   ? bil knees, hands  ? Atypical chest pain   ? Autoimmune disease ([place])   ? Breast cancer metastasized to liver [facility]) 09/2020  ? Cancer ([place]) 07/2020  ? right breast IDC  ? Chronic fatigue   ? Depression   ? DVT (deep venous  thrombosis) ([place])   ? IBS (irritable bowel syndrome)   ? with constipation  ? Lupus ([place])   ? Pulmonary embolus ([place])   ? Raynaud's syndrome   ? Sicca ([place])   ? Thrombocytopenia ([place])   ?  ?Family History  ?Problem Relation Age of Onset  ? Rheum arthritis Father   ? Hemachromatosis Father   ? Heart Problems Father   ? Cancer Sister   ?     bone   ? Colon cancer Neg Hx   ? Stomach cancer Neg Hx   ? Esophageal cancer Neg Hx   ? Pancreatic cancer Neg Hx   ? Liver disease Neg Hx   ? ?Past Surgical History:  ?Procedure Laterality Date  ? ABLATION  12/2018  ? leg veins  ? ABLATION Right 08/2019  ? venous leg   ? APPENDECTOMY    ? COLON SURGERY    ? Perforated colon repair after colostomy  ? EYE SURGERY Bilateral 2022  ? GANGLION CYST EXCISION    ? HYSTEROSCOPY WITH D & C N/A 01/13/2014  ? Procedure: DILATATION AND CURETTAGE /HYSTEROSCOPY with resection ;  Surgeon: Olga Millers, MD;  Location: [facility];  Service: Gynecology;  Laterality: N/A;  ? LAPAROTOMY    ? PORTACATH PLACEMENT N/A 09/13/2020  ? Procedure: INSERTION PORT-A-CATH WITH ULTRASOUND GUIDANCE;  Surgeon: Rolm Bookbinder, MD;  Location: [facility];  Service: General;  Laterality: N/A;  ? ?Social History  ? ?Social History Narrative  ? Not on file  ? ?Immunization History  ?Administered Date(s) Administered  ? PFIZER Comirnaty(Gray [place])Covid-19 Tri-Sucrose Vaccine 08/11/2020  ?  ? ?Objective: ?Vital Signs: BP 110/72 (BP Location: Left Arm, Patient Position: Sitting, Cuff Size: Normal)   Pulse 69   Ht '5\' 4"'$  (1.626 m)   Wt 113 lb (51.3 kg)   BMI 19.40 kg/m?   ? ?Physical Exam ?Vitals and nursing note reviewed.  ?Constitutional:   ?   Appearance: She is well-developed.  ?HENT:  ?   Head: Normocephalic and atraumatic.  ?Eyes:  ?   Conjunctiva/sclera: Conjunctivae normal.  ?Cardiovascular:  ?   Rate and Rhythm: Normal rate and regular rhythm.  ?   Heart sounds: Normal heart sounds.  ?Pulmonary:  ?   Effort: Pulmonary effort is normal.  ?    Breath sounds: Normal breath sounds.  ?Abdominal:  ?   General: Bowel sounds are normal.  ?   Palpations: Abdomen is soft.  ?Musculoskeletal:  ?   Cervical back: Normal range of motion.  ?Skin: ?   General: Skin is warm and dry.  ?   Capillary Refill: Capillary refill takes less than 2 seconds.  ?Neurological:  ?   Mental Status: She is alert and oriented to person, place, and time.  ?Psychiatric:     ?   Behavior: Behavior normal.  ?  ? ?Musculoskeletal Exam: C-spine, thoracic spine, and lumbar spine good ROM. Shoulder joints, elbow joints, and wrist joints have good ROM.  Tenderness and thickening of bilateral 1st, 2nd, and 3rd MCP joints.  PIP and DIP thickening consistent with OA of both hands.  Hip joints have good  ROM with no groin pain.  Painful ROM of the left knee with a small effusion.  Right knee has good ROM with no warmth or effusion.  Ankle joints have good ROM with no tenderness or synovitis.  ? ?CDAI Exam: ?CDAI Score: -- ?Patient Global: --; Provider Global: -- ?Swollen: --; Tender: -- ?Joint Exam 03/21/2022  ? ?No joint exam has been documented for this visit  ? ?There is currently no information documented on the homunculus. Go to the Rheumatology activity and complete the homunculus joint exam. ? ?Investigation: ?No additional findings. ? ?Imaging: ?No results found. ? ?Recent Labs: ?Lab Results  ?Component Value Date  ? WBC 2.8 (L) 03/05/2022  ? HGB 11.1 (L) 03/05/2022  ? PLT 172 03/05/2022  ? NA 140 03/05/2022  ? K 3.8 03/05/2022  ? CL 107 03/05/2022  ? CO2 29 03/05/2022  ? GLUCOSE 97 03/05/2022  ? BUN 14 03/05/2022  ? CREATININE 0.84 03/05/2022  ? BILITOT 0.4 03/05/2022  ? ALKPHOS 93 03/05/2022  ? AST 22 03/05/2022  ? ALT 19 03/05/2022  ? PROT 6.4 (L) 03/05/2022  ? ALBUMIN 4.0 03/05/2022  ? CALCIUM 8.7 (L) 03/05/2022  ? GFRAA 95 07/28/2020  ? QFTBGOLDPLUS Negative 12/19/2021  ? ? ?Speciality Comments: PLQ Eye Exam: 01/19/2021 WNL @ [facility] follow up in 1 year. ?stage IB invasive  ductal carcinoma, grade 3 08/16/20 ? ?Procedures:  ?No procedures performed ?Allergies: Beef-derived products, Latex, Sulfa antibiotics, and Tape  ? ?PLQ Eye Exam: 01/19/2021 WNL @ [facility] follow up i

## 2022-03-08 ENCOUNTER — Other Ambulatory Visit: Payer: Self-pay | Admitting: Rheumatology

## 2022-03-08 MED ORDER — AMPHETAMINE-DEXTROAMPHET ER 20 MG PO CP24
20.0000 mg | ORAL_CAPSULE | Freq: Every day | ORAL | 0 refills | Status: DC
Start: 2022-03-08 — End: 2022-04-09

## 2022-03-08 NOTE — Telephone Encounter (Signed)
Patient called the office requesting a refill of Adderall 20mg be sent to [facility] on [address]. 

## 2022-03-08 NOTE — Telephone Encounter (Signed)
Next Visit: 03/21/2022 ?  ?Last Visit: 12/19/2021 ?  ?Last Fill: 02/11/2022 ?  ?Dx: Primary insomnia ?  ?Current Dose per office note on 12/19/2021: not discussed ?  ?Okay to refill Adderall?   ?

## 2022-03-13 ENCOUNTER — Other Ambulatory Visit (HOSPITAL_COMMUNITY): Payer: Self-pay | Admitting: Otolaryngology

## 2022-03-13 ENCOUNTER — Other Ambulatory Visit: Payer: Self-pay | Admitting: Otolaryngology

## 2022-03-13 DIAGNOSIS — R1312 Dysphagia, oropharyngeal phase: Secondary | ICD-10-CM

## 2022-03-21 ENCOUNTER — Ambulatory Visit (INDEPENDENT_AMBULATORY_CARE_PROVIDER_SITE_OTHER): Payer: 59 | Admitting: Physician Assistant

## 2022-03-21 ENCOUNTER — Encounter: Payer: Self-pay | Admitting: Physician Assistant

## 2022-03-21 VITALS — BP 110/72 | HR 69 | Ht 64.0 in | Wt 113.0 lb

## 2022-03-21 DIAGNOSIS — Z8719 Personal history of other diseases of the digestive system: Secondary | ICD-10-CM

## 2022-03-21 DIAGNOSIS — E559 Vitamin D deficiency, unspecified: Secondary | ICD-10-CM

## 2022-03-21 DIAGNOSIS — M329 Systemic lupus erythematosus, unspecified: Secondary | ICD-10-CM | POA: Diagnosis not present

## 2022-03-21 DIAGNOSIS — I7300 Raynaud's syndrome without gangrene: Secondary | ICD-10-CM

## 2022-03-21 DIAGNOSIS — M170 Bilateral primary osteoarthritis of knee: Secondary | ICD-10-CM

## 2022-03-21 DIAGNOSIS — L930 Discoid lupus erythematosus: Secondary | ICD-10-CM

## 2022-03-21 DIAGNOSIS — M818 Other osteoporosis without current pathological fracture: Secondary | ICD-10-CM

## 2022-03-21 DIAGNOSIS — F5101 Primary insomnia: Secondary | ICD-10-CM

## 2022-03-21 DIAGNOSIS — C50511 Malignant neoplasm of lower-outer quadrant of right female breast: Secondary | ICD-10-CM

## 2022-03-21 DIAGNOSIS — Z170 Estrogen receptor positive status [ER+]: Secondary | ICD-10-CM

## 2022-03-21 DIAGNOSIS — M19042 Primary osteoarthritis, left hand: Secondary | ICD-10-CM

## 2022-03-21 DIAGNOSIS — M1990 Unspecified osteoarthritis, unspecified site: Secondary | ICD-10-CM

## 2022-03-21 DIAGNOSIS — Z79899 Other long term (current) drug therapy: Secondary | ICD-10-CM | POA: Diagnosis not present

## 2022-03-21 DIAGNOSIS — Z86711 Personal history of pulmonary embolism: Secondary | ICD-10-CM

## 2022-03-21 DIAGNOSIS — R760 Raised antibody titer: Secondary | ICD-10-CM

## 2022-03-21 DIAGNOSIS — Z8349 Family history of other endocrine, nutritional and metabolic diseases: Secondary | ICD-10-CM

## 2022-03-21 DIAGNOSIS — Z86718 Personal history of other venous thrombosis and embolism: Secondary | ICD-10-CM

## 2022-03-21 DIAGNOSIS — M19041 Primary osteoarthritis, right hand: Secondary | ICD-10-CM

## 2022-03-21 DIAGNOSIS — Z95828 Presence of other vascular implants and grafts: Secondary | ICD-10-CM

## 2022-03-21 DIAGNOSIS — Z8659 Personal history of other mental and behavioral disorders: Secondary | ICD-10-CM

## 2022-03-28 ENCOUNTER — Ambulatory Visit (HOSPITAL_COMMUNITY)
Admission: RE | Admit: 2022-03-28 | Discharge: 2022-03-28 | Disposition: A | Discharge status: Home / Self Care | Payer: 59 | Source: Ambulatory Visit | Attending: Otolaryngology | Admitting: Otolaryngology

## 2022-03-28 DIAGNOSIS — R1312 Dysphagia, oropharyngeal phase: Secondary | ICD-10-CM | POA: Insufficient documentation

## 2022-03-28 IMAGING — RF DG ESOPHAGUS
10 of 15 series · 13 of 24 positions shown · non-contrast
Comparison: None Available.

CLINICAL DATA: Oropharyngeal dysphagia and odynophagia. Solid food
sticking in upper chest. Undergoing chemotherapy for metastatic
breast carcinoma.

EXAM:
ESOPHOGRAM / BARIUM SWALLOW / BARIUM TABLET STUDY
TECHNIQUE: Combined double contrast and single contrast examination performed
using effervescent crystals, thick barium liquid, and thin barium
liquid. The patient was observed with fluoroscopy swallowing a 13 mm
barium sulphate tablet.
FLUOROSCOPY:
Radiation Exposure Index (as provided by the fluoroscopic device):
11.5 mGy Kerma

[Series 1: cp_standard · 0.37mm/px · 2 of 60 frames shown (1 of 10)]
[frame 10/60]
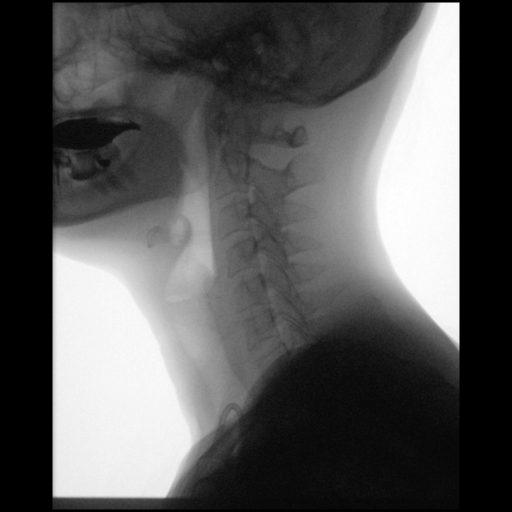
[frame 31/60]
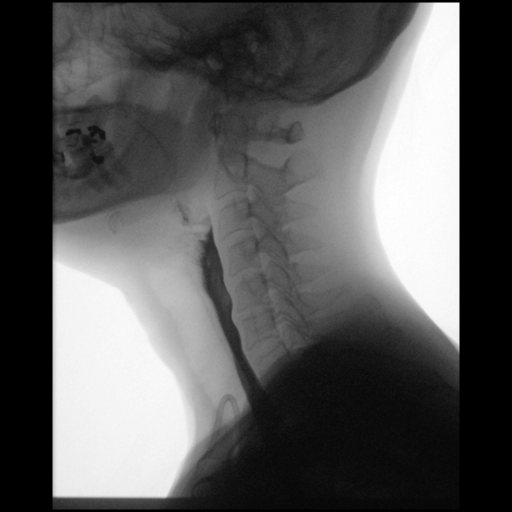

[Series 2: cp_standard · 0.37mm/px · 2 of 45 frames shown (2 of 10)]
[frame 7/45]
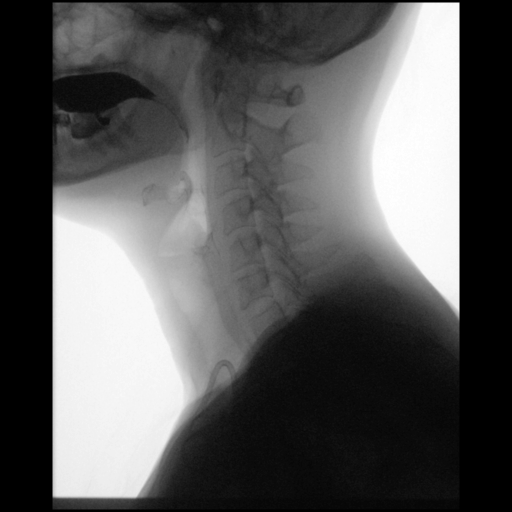
[frame 23/45]
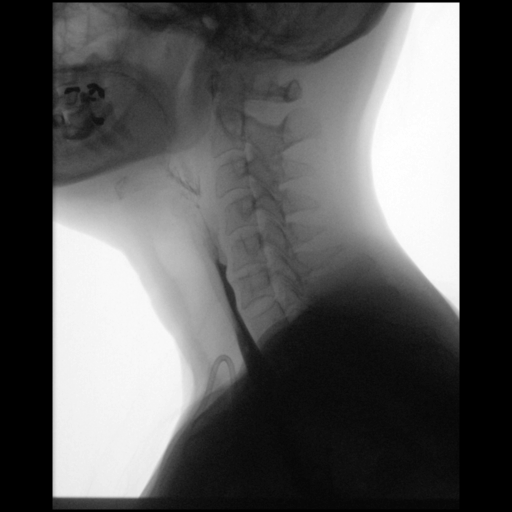

[Series 3: cp_standard · 0.37mm/px · 2 of 43 frames shown (3 of 10)]
[frame 7/43]
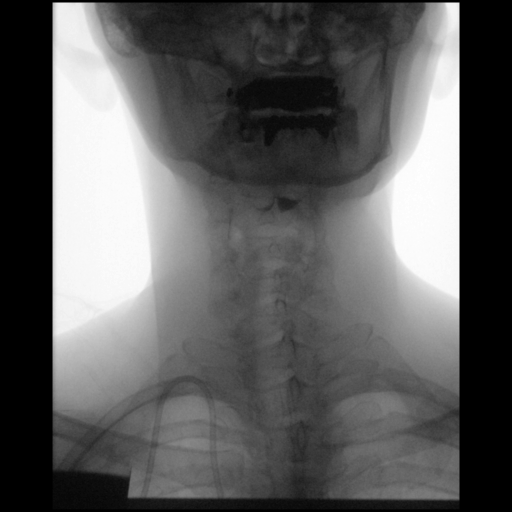
[frame 37/43]
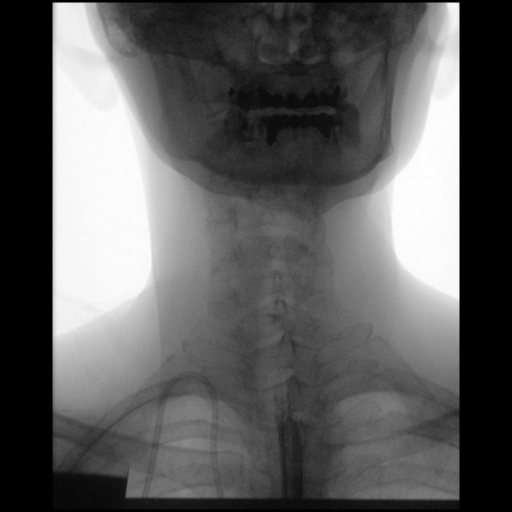

[Series 5: cp_standard · 0.19mm/px · 1 of 1 slices shown (4 of 10)]
[im 1/1]
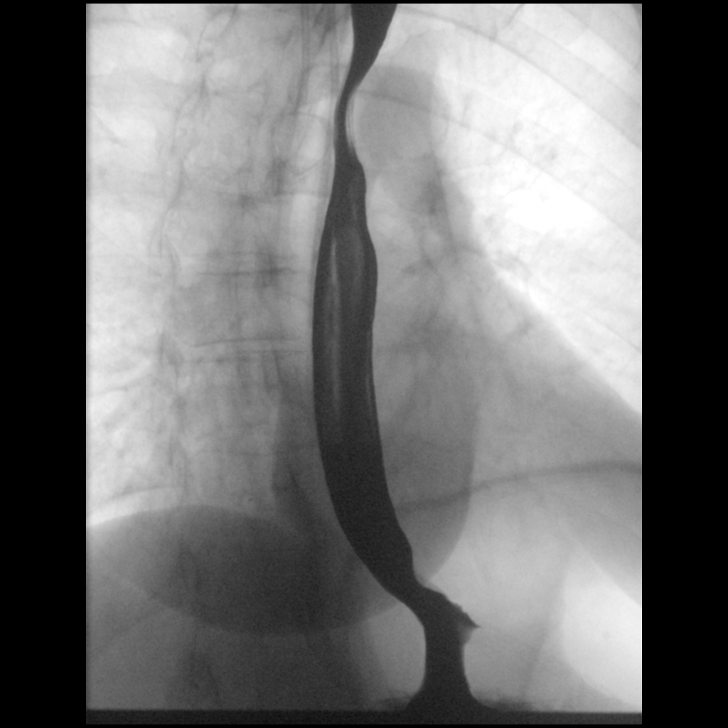

[Series 6: cp_standard · 0.19mm/px · 1 of 1 slices shown (5 of 10)]
[im 1/1]
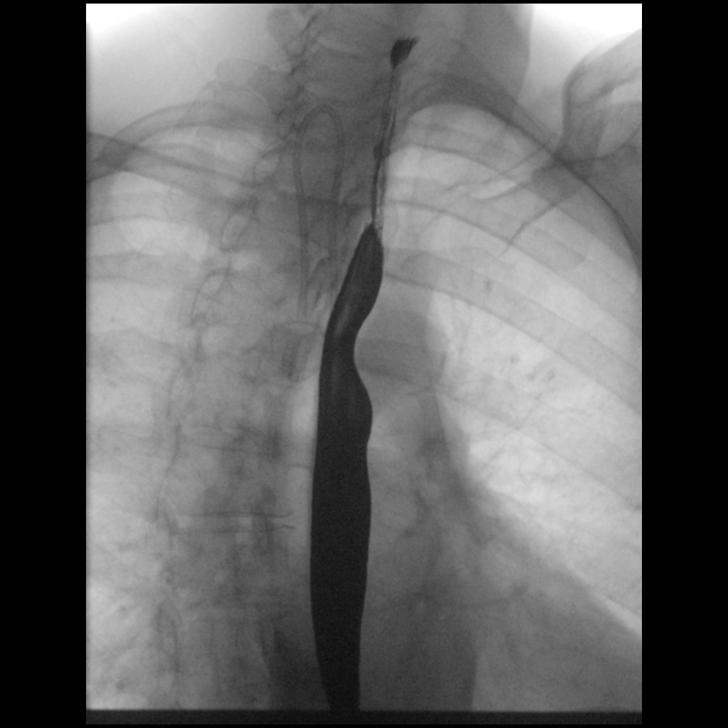

[Series 8: cp_standard · 0.19mm/px · 1 of 1 slices shown (6 of 10)]
[im 1/1]
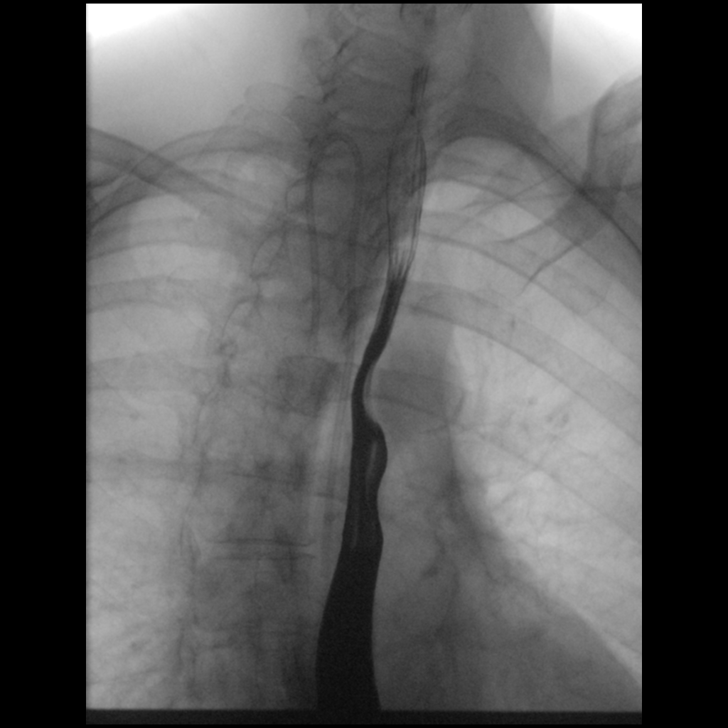

[Series 10: cp_standard · 0.19mm/px · 1 of 1 slices shown (7 of 10)]
[im 1/1]
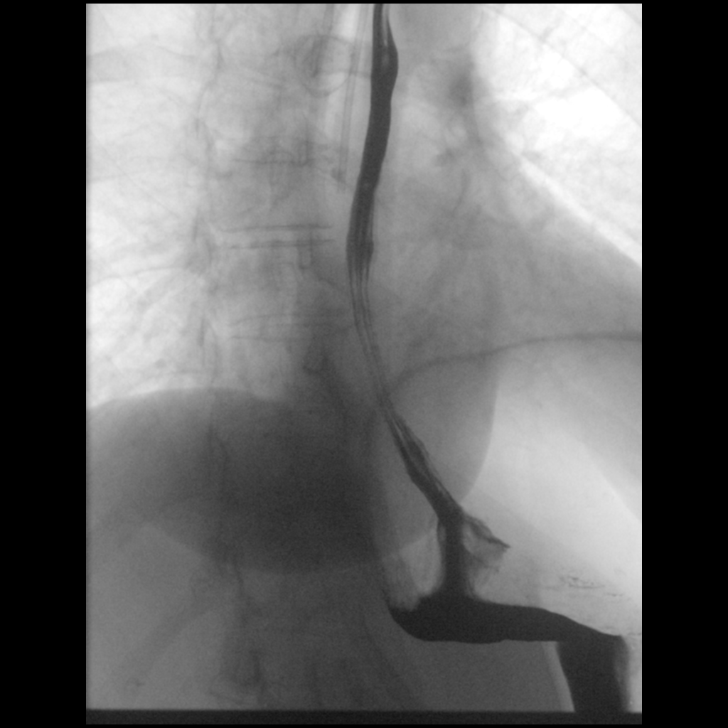

[Series 12: cp_standard · 0.21mm/px · 1 of 1 slices shown (8 of 10)]
[im 1/1]
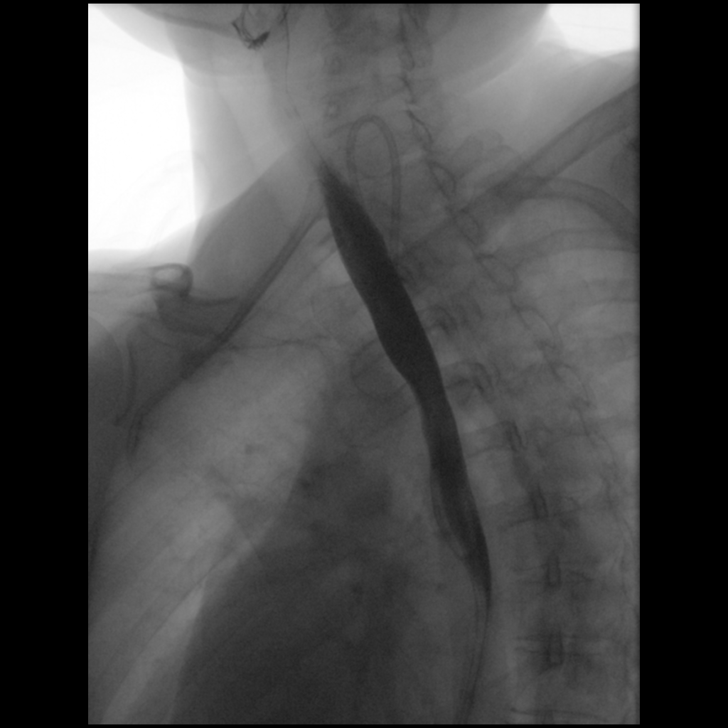

[Series 14: cp_standard · 0.21mm/px · 1 of 1 slices shown (9 of 10)]
[im 1/1]
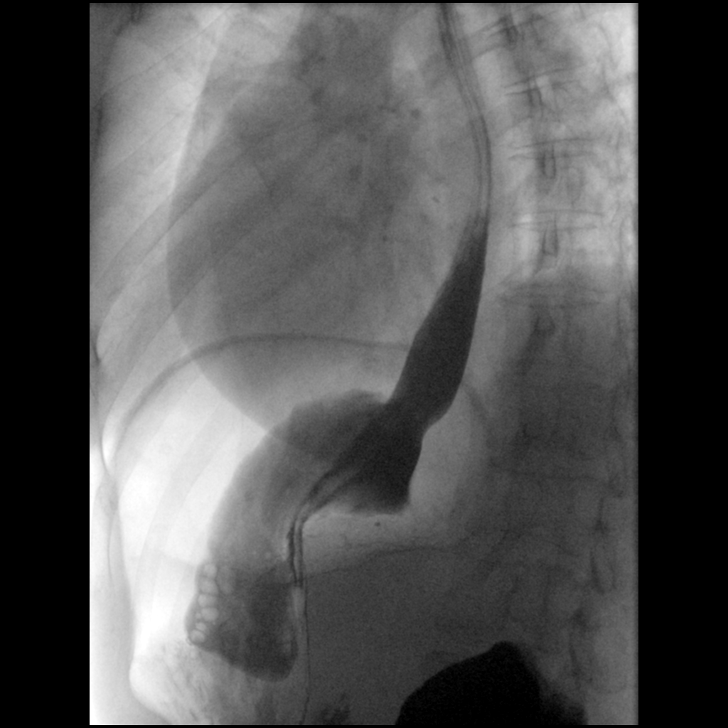

[Series 16: cp_standard · 0.21mm/px · 1 of 1 slices shown (10 of 10)]
[im 1/1]
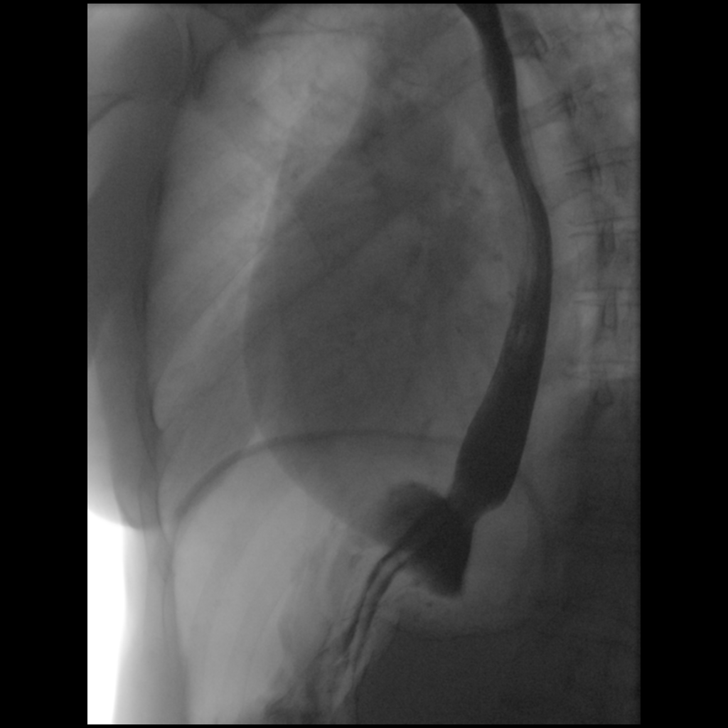

[13 of 24 positions shown; findings below may reference images not displayed]

FINDINGS: No evidence of vestibular penetration or aspiration during
swallowing. Pharynx and cervical esophagus are unremarkable.

No evidence of esophageal mass or stricture. No findings of
esophagitis noted. Esophageal motility is within normal limits.

No evidence of hiatal hernia or gastroesophgeal reflux. An ingested
13mm barium tablet passed freely through the esophagus, and into the
stomach.
IMPRESSION: Normal esophagram.

## 2022-03-28 NOTE — Progress Notes (Signed)
Patient Care Team: Eunice Blase, MD as PCP - General (Family Medicine) Prudence Davidson, DeWitt Bing, MD as Referring Physician (Oncology) Rockwell Germany, RN as Oncology Nurse Navigator Mauro Kaufmann, RN as Oncology Nurse Navigator Rolm Bookbinder, MD as Consulting Physician (General Surgery) Kyung Rudd, MD as Consulting Physician (Radiation Oncology) Bo Merino, MD as Consulting Physician (Rheumatology) Olga Millers, MD as Consulting Physician (Obstetrics and Gynecology) Larey Dresser, MD as Consulting Physician (Cardiology) Armbruster, Carlota Raspberry, MD as Consulting Physician (Gastroenterology) Nena Jordan, MD as Referring Physician (Ophthalmology) Nena Jordan, MD as Referring Physician (Ophthalmology) Nicholas Lose, MD as Consulting Physician (Hematology and Oncology)  DIAGNOSIS:  Encounter Diagnosis  Name Primary?   Malignant neoplasm of lower-outer quadrant of right breast of female, estrogen receptor positive ([place]) Yes    SUMMARY OF ONCOLOGIC HISTORY: Oncology History  Malignant neoplasm of lower-outer quadrant of right breast of female, estrogen receptor positive ([place])  08/04/2020 Initial Diagnosis   Right breast lower outer quadrant biopsy 08/04/2020 for a clinical T2 N0 M1, stage IV invasive ductal carcinoma, grade 3, triple positive, with an MIB-1 of 40%. abdominal MRI 08/24/2020 shows multiple liver lesions highly suspicious for liver metastases. Liver biopsy 09/20/2020 positive for carcinoma, prognostic panel estrogen and progesterone receptor positive, but HER-2 negative (0); MIB-1 of 30%   08/31/2020 - 12/14/2020 Chemotherapy   Neoadjuvant chemotherapy consisting of docetaxel, carboplatin, trastuzumab and pertuzumab every 21 days x 6    12/26/2020 Imaging   breast MRI 12/26/2020 showed right breast index lesion decreased to 0.8 cm; 2 additional masses in the right breast stable abdominal MRI 01/29/2021 shows no active disease in the liver     Chemotherapy   Herceptin Maintenance initially q 3 weeks, now q 4 weeks   01/29/2021 -  Anti-estrogen oral therapy   anastrozole and palbociclib    Malignant neoplasm metastatic to liver ([place])  01/03/2021 Initial Diagnosis   Liver metastases ([place])    01/03/2021 -  Chemotherapy   Patient is on Treatment Plan : BREAST Trastuzumab q21d        CHIEF COMPLIANT: Follow-up Metastatic breast  breast cancer    INTERVAL HISTORY: Erica Wood is a  56 y.o. with above-mentioned history of  Metastatic breast cancer, currently on chemotherapy with Herceptin. She presents to the clinic today for treatment. States that she is very fatigue. She states that it has got a little better. States that she is very exhausted after work. She states that she has been having throat issues, but it's better now. Complains of lower back pain. Complains of painful and irritable vagina issues.     ALLERGIES:  is allergic to beef-derived products, latex, sulfa antibiotics, and tape.  MEDICATIONS:  Current Outpatient Medications  Medication Sig Dispense Refill   [START ON 04/05/2022] estradiol (ESTRACE VAGINAL) 0.1 MG/GM vaginal cream Place 1 Applicatorful vaginally 3 (three) times a week. 42.5 g 12   acetaminophen (TYLENOL) 500 MG tablet Take 1,000 mg by mouth every 6 (six) hours as needed for moderate pain or headache.     amphetamine-dextroamphetamine (ADDERALL XR) 20 MG 24 hr capsule Take 1 capsule (20 mg total) by mouth daily. 30 capsule 0   Ascorbic Acid (VITAMIN C PO) Take 3,000 mg by mouth daily.     Cholecalciferol (VITAMIN D3) 75 MCG (3000 UT) TABS Take 3,000 Units by mouth daily.     Clindamycin-Benzoyl Per, Refr, gel Apply topically.     famotidine (PEPCID) 20 MG tablet Take 20 mg by mouth  at bedtime.     hydroxychloroquine (PLAQUENIL) 200 MG tablet Take 1 tablet (200 mg total) by mouth daily. 90 tablet 4   letrozole (FEMARA) 2.5 MG tablet Take 1 tablet (2.5 mg total) by mouth daily. 90 tablet 3    lidocaine-prilocaine (EMLA) cream Apply topically.     metoCLOPramide (REGLAN) 10 MG tablet Take 5-10 mg by mouth as needed.     palbociclib (IBRANCE) 75 MG tablet Take 1 tablet (75 mg total) by mouth daily. Take for 21 days on, 7 days off, repeat every 28 days. 21 tablet 6   spironolactone (ALDACTONE) 25 MG tablet Take 25 mg by mouth daily as needed (swelling).     traMADol (ULTRAM) 50 MG tablet Take 1-2 tablets (50-100 mg total) by mouth every 6 (six) hours as needed. 60 tablet 0   tretinoin (RETIN-A) 0.1 % cream Apply 1 application topically at bedtime as needed (acne).      triamterene-hydrochlorothiazide (DYAZIDE) 37.5-25 MG capsule Take 1 each (1 capsule total) by mouth daily. 90 capsule 1   warfarin (COUMADIN) 5 MG tablet TAKE 1 TABLET(5 MG) BY MOUTH DAILY 30 tablet PRN   No current facility-administered medications for this visit.    PHYSICAL EXAMINATION: ECOG PERFORMANCE STATUS: 1 - Symptomatic but completely ambulatory  Vitals:   04/04/22 1004  BP: 129/68  Pulse: 64  Resp: 17  Temp: 97.7 F (36.5 C)  SpO2: 100%   Filed Weights   04/04/22 1004  Weight: 114 lb 12.8 oz (52.1 kg)      LABORATORY DATA:  I have reviewed the data as listed    Latest Ref Rng & Units 04/04/2022    9:37 AM 03/05/2022    1:42 PM 02/06/2022    8:41 AM  CMP  Glucose 70 - 99 mg/dL 150   97   88    BUN 6 - 20 mg/dL _0 Creatinine 0.44 - 1.00 mg/dL 0.90   0.84   0.85    Sodium 135 - 145 mmol/L 136   140   138    Potassium 3.5 - 5.1 mmol/L 3.9   3.8   4.0    Chloride 98 - 111 mmol/L 105   107   110    CO2 22 - 32 mmol/L _1 Calcium 8.9 - 10.3 mg/dL 8.8   8.7   8.9    Total Protein 6.5 - 8.1 g/dL 6.6   6.4   6.7    Total Bilirubin 0.3 - 1.2 mg/dL 0.5   0.4   0.3    Alkaline Phos 38 - 126 U/L 97   93   98    AST 15 - 41 U/L _2 ALT 0 - 44 U/L _3 Lab Results  Component Value Date   WBC 3.9 (L) 04/04/2022   HGB 12.8 04/04/2022   HCT 36.7  04/04/2022   MCV 100.0 04/04/2022   PLT 237 04/04/2022   NEUTROABS 2.3 04/04/2022    ASSESSMENT & PLAN:  Malignant neoplasm of lower-outer quadrant of right breast of female, estrogen receptor positive ([place]) 08/04/2020: Right breast biopsy T2 N0 M1 stage IV IDC grade 3, triple positive with a Ki-67 of 40%, multiple liver lesions biopsy was positive for breast cancer ER/PR positive HER2 negative with a Ki-67 30% 08/31/2020-12/14/2020: [facility]  x6   Current treatment: Herceptin maintenance every 4 weeks, letrozole and palbociclib (started 01/29/2021) Treatment toxicities: Severe constipation: She had constipation problems even prior to starting treatments.  She is using Dulcolax as needed Gastritis: I sent a prescription for Pepcid Neutropenia: Secondary to Ibrance Severe fatigue: I reduce the dosage of Ibrance to 75 mg today   COVID infection January 2023: Held [facility] and Herceptin treatments.  Resumed 02/06/2022  01/17/2022: CT CAP: Very subtle metastatic lesion of the caudate lobe is unchanged.  No new metastatic disease elsewhere.    Recommendation:  Continue with current management CT CAP in 3 months After that we will make a decision whether to stop the Ibrance or the Herceptin.  (If she has had a tremendous response and no concern for progression) I recommend that she keep the port in if she were to stop the Herceptin.    Orders Placed This Encounter  Procedures   CT CHEST ABDOMEN PELVIS W CONTRAST    Standing Status:   Future    Standing Expiration Date:   04/05/2023    Order Specific Question:   Is patient pregnant?    Answer:   No    Order Specific Question:   Preferred imaging location?    Answer:   [facility]    Order Specific Question:   Is Oral Contrast requested for this exam?    Answer:   Yes, Per Radiology protocol   The patient has a good understanding of the overall plan. she agrees with it. she will call with any problems that may develop before the next  visit here. Total time spent: 30 mins including face to face time and time spent for planning, charting and co-ordination of care   Harriette Ohara, MD 04/04/22    I Gardiner Coins am scribing for Dr. Lindi Adie  I have reviewed the above documentation for accuracy and completeness, and I agree with the above.

## 2022-04-03 ENCOUNTER — Ambulatory Visit: Payer: 59

## 2022-04-03 ENCOUNTER — Inpatient Hospital Stay: Payer: 59

## 2022-04-03 ENCOUNTER — Inpatient Hospital Stay: Payer: 59 | Admitting: Adult Health

## 2022-04-03 ENCOUNTER — Inpatient Hospital Stay: Payer: 59 | Admitting: Hematology and Oncology

## 2022-04-04 ENCOUNTER — Other Ambulatory Visit: Payer: Self-pay | Admitting: Hematology and Oncology

## 2022-04-04 ENCOUNTER — Other Ambulatory Visit: Payer: Self-pay

## 2022-04-04 ENCOUNTER — Inpatient Hospital Stay: Payer: 59

## 2022-04-04 ENCOUNTER — Inpatient Hospital Stay (HOSPITAL_BASED_OUTPATIENT_CLINIC_OR_DEPARTMENT_OTHER): Payer: 59 | Admitting: Hematology and Oncology

## 2022-04-04 ENCOUNTER — Inpatient Hospital Stay: Payer: 59 | Attending: Oncology

## 2022-04-04 VITALS — BP 129/68 | HR 64 | Temp 97.7°F | Resp 17 | Ht 64.0 in | Wt 114.8 lb

## 2022-04-04 DIAGNOSIS — Z5112 Encounter for antineoplastic immunotherapy: Secondary | ICD-10-CM | POA: Diagnosis present

## 2022-04-04 DIAGNOSIS — Z95828 Presence of other vascular implants and grafts: Secondary | ICD-10-CM

## 2022-04-04 DIAGNOSIS — C787 Secondary malignant neoplasm of liver and intrahepatic bile duct: Secondary | ICD-10-CM | POA: Diagnosis present

## 2022-04-04 DIAGNOSIS — Z7901 Long term (current) use of anticoagulants: Secondary | ICD-10-CM | POA: Diagnosis not present

## 2022-04-04 DIAGNOSIS — Z170 Estrogen receptor positive status [ER+]: Secondary | ICD-10-CM | POA: Diagnosis not present

## 2022-04-04 DIAGNOSIS — C50511 Malignant neoplasm of lower-outer quadrant of right female breast: Secondary | ICD-10-CM | POA: Diagnosis present

## 2022-04-04 DIAGNOSIS — Z79899 Other long term (current) drug therapy: Secondary | ICD-10-CM | POA: Insufficient documentation

## 2022-04-04 DIAGNOSIS — Z86718 Personal history of other venous thrombosis and embolism: Secondary | ICD-10-CM

## 2022-04-04 LAB — CBC WITH DIFFERENTIAL (CANCER CENTER ONLY)
Abs Immature Granulocytes: 0.01 10*3/uL (ref 0.00–0.07)
Basophils Absolute: 0.1 10*3/uL (ref 0.0–0.1)
Basophils Relative: 3 %
Eosinophils Absolute: 0 10*3/uL (ref 0.0–0.5)
Eosinophils Relative: 1 %
HCT: 36.7 % (ref 36.0–46.0)
Hemoglobin: 12.8 g/dL (ref 12.0–15.0)
Immature Granulocytes: 0 %
Lymphocytes Relative: 31 %
Lymphs Abs: 1.2 10*3/uL (ref 0.7–4.0)
MCH: 34.9 pg — ABNORMAL HIGH (ref 26.0–34.0)
MCHC: 34.9 g/dL (ref 30.0–36.0)
MCV: 100 fL (ref 80.0–100.0)
Monocytes Absolute: 0.2 10*3/uL (ref 0.1–1.0)
Monocytes Relative: 6 %
Neutro Abs: 2.3 10*3/uL (ref 1.7–7.7)
Neutrophils Relative %: 59 %
Platelet Count: 237 10*3/uL (ref 150–400)
RBC: 3.67 MIL/uL — ABNORMAL LOW (ref 3.87–5.11)
RDW: 14 % (ref 11.5–15.5)
WBC Count: 3.9 10*3/uL — ABNORMAL LOW (ref 4.0–10.5)
nRBC: 0 % (ref 0.0–0.2)

## 2022-04-04 LAB — CMP (CANCER CENTER ONLY)
ALT: 12 U/L (ref 0–44)
AST: 18 U/L (ref 15–41)
Albumin: 4.1 g/dL (ref 3.5–5.0)
Alkaline Phosphatase: 97 U/L (ref 38–126)
Anion gap: 4 — ABNORMAL LOW (ref 5–15)
BUN: 15 mg/dL (ref 6–20)
CO2: 27 mmol/L (ref 22–32)
Calcium: 8.8 mg/dL — ABNORMAL LOW (ref 8.9–10.3)
Chloride: 105 mmol/L (ref 98–111)
Creatinine: 0.9 mg/dL (ref 0.44–1.00)
GFR, Estimated: >60 mL/min (ref >60)
Glucose, Bld: 150 mg/dL — ABNORMAL HIGH (ref 70–99)
Potassium: 3.9 mmol/L (ref 3.5–5.1)
Sodium: 136 mmol/L (ref 135–145)
Total Bilirubin: 0.5 mg/dL (ref 0.3–1.2)
Total Protein: 6.6 g/dL (ref 6.5–8.1)

## 2022-04-04 LAB — PROTIME-INR
INR: 1.9 — ABNORMAL HIGH (ref 0.8–1.2)
Prothrombin Time: 21.2 seconds — ABNORMAL HIGH (ref 11.4–15.2)

## 2022-04-04 MED ORDER — PALBOCICLIB 75 MG PO TABS: 75.0000 mg | ORAL_TABLET | Freq: Every day | ORAL | 6 refills | Status: DC

## 2022-04-04 MED ORDER — SODIUM CHLORIDE 0.9 % IV SOLN: Freq: Once | INTRAVENOUS | Status: AC

## 2022-04-04 MED ORDER — ACETAMINOPHEN 325 MG PO TABS
650.0000 mg | ORAL_TABLET | Freq: Once | ORAL | Status: AC
  Administered 2022-04-04: 650 mg via ORAL
  Filled 2022-04-04: qty 2

## 2022-04-04 MED ORDER — HEPARIN SOD (PORK) LOCK FLUSH 100 UNIT/ML IV SOLN
500.0000 [IU] | Freq: Once | INTRAVENOUS | Status: AC | PRN
  Administered 2022-04-04: 500 [IU]

## 2022-04-04 MED ORDER — SODIUM CHLORIDE 0.9% FLUSH
10.0000 mL | Freq: Once | INTRAVENOUS | Status: AC
  Administered 2022-04-04: 10 mL

## 2022-04-04 MED ORDER — ESTRADIOL 0.1 MG/GM VA CREA: 1.0000 | TOPICAL_CREAM | VAGINAL | 12 refills | Status: DC

## 2022-04-04 MED ORDER — DIPHENHYDRAMINE HCL 25 MG PO CAPS
25.0000 mg | ORAL_CAPSULE | Freq: Once | ORAL | Status: AC
  Administered 2022-04-04: 25 mg via ORAL
  Filled 2022-04-04: qty 1

## 2022-04-04 MED ORDER — TRASTUZUMAB-ANNS CHEMO 150 MG IV SOLR
8.0000 mg/kg | Freq: Once | INTRAVENOUS | Status: AC
  Administered 2022-04-04: 420 mg via INTRAVENOUS
  Filled 2022-04-04: qty 20

## 2022-04-04 MED ORDER — SODIUM CHLORIDE 0.9% FLUSH
10.0000 mL | INTRAVENOUS | Status: DC | PRN
  Administered 2022-04-04: 10 mL

## 2022-04-04 NOTE — Progress Notes (Signed)
Ok to treat with Echo from 12/12/21 per Dr. Lindi Adie.

## 2022-04-04 NOTE — Assessment & Plan Note (Addendum)
08/04/2020: Right breast biopsy T2 N0 M1 stage IV IDC grade 3, triple positive with a Ki-67 of 40%, multiple liver lesions biopsy was positive for breast cancer ER/PR positive HER2 negative with a Ki-67 30% 08/31/2020-12/14/2020: T[facility]P x6  Current treatment: Herceptin maintenance every 4 weeks, letrozole and palbociclib (started 01/29/2021) Treatment toxicities: 1. Severe constipation: She had constipation problems even prior to starting treatments. She is using Dulcolax as needed 2. Gastritis: I sent a prescription for Pepcid 3. Neutropenia: Secondary to Ibrance 4. Severe fatigue: I reduce the dosage of Ibrance to 75 mg today  COVID infection January 2023: Held [place] and Herceptin treatments.  Resumed 02/06/2022  01/17/2022: CT CAP: Very subtle metastatic lesion of the caudate lobe is unchanged.  No new metastatic disease elsewhere.   Recommendation: Continue with current management CT CAP in 3 months After that we will make a decision whether to stop the Ibrance or the Herceptin.  (If she has had a tremendous response and no concern for progression) I recommend that she keep the port in if she were to stop the Herceptin.

## 2022-04-04 NOTE — Patient Instructions (Signed)
[facility]   ?Discharge Instructions: ?Thank you for choosing [facility] to provide your oncology and hematology care.  ? ?If you have a lab appointment with the Cancer Center, please go directly to the Cancer Center and check in at the registration area. ?  ?Wear comfortable clothing and clothing appropriate for easy access to any Portacath or PICC line.  ? ?We strive to give you quality time with your provider. You may need to reschedule your appointment if you arrive late (15 or more minutes).  Arriving late affects you and other patients whose appointments are after yours.  Also, if you miss three or more appointments without notifying the office, you may be dismissed from the clinic at the provider?s discretion.    ?  ?For prescription refill requests, have your pharmacy contact our office and allow 72 hours for refills to be completed.   ? ?Today you received the following chemotherapy and/or immunotherapy agents: trastuzumab-anns    ?  ?To help prevent nausea and vomiting after your treatment, we encourage you to take your nausea medication as directed. ? ?BELOW ARE SYMPTOMS THAT SHOULD BE REPORTED IMMEDIATELY: ?*FEVER GREATER THAN 100.4 F (38 ?C) OR HIGHER ?*CHILLS OR SWEATING ?*NAUSEA AND VOMITING THAT IS NOT CONTROLLED WITH YOUR NAUSEA MEDICATION ?*UNUSUAL SHORTNESS OF BREATH ?*UNUSUAL BRUISING OR BLEEDING ?*URINARY PROBLEMS (pain or burning when urinating, or frequent urination) ?*BOWEL PROBLEMS (unusual diarrhea, constipation, pain near the anus) ?TENDERNESS IN MOUTH AND THROAT WITH OR WITHOUT PRESENCE OF ULCERS (sore throat, sores in mouth, or a toothache) ?UNUSUAL RASH, SWELLING OR PAIN  ?UNUSUAL VAGINAL DISCHARGE OR ITCHING  ? ?Items with * indicate a potential emergency and should be followed up as soon as possible or go to the Emergency Department if any problems should occur. ? ?Please show the CHEMOTHERAPY ALERT CARD or IMMUNOTHERAPY ALERT CARD at  check-in to the Emergency Department and triage nurse. ? ?Should you have questions after your visit or need to cancel or reschedule your appointment, please contact [facility]  Dept: [phone]  and follow the prompts.  Office hours are 8:00 a.m. to 4:30 p.m. Monday - Friday. Please note that voicemails left after 4:00 p.m. may not be returned until the following business day.  We are closed weekends and major holidays. You have access to a nurse at all times for urgent questions. Please call the main number to the clinic Dept: [phone] and follow the prompts. ? ? ?For any non-urgent questions, you may also contact your provider using MyChart. We now offer e-Visits for anyone 18 and older to request care online for non-urgent symptoms. For details visit [email or web address]. ?  ?Also download the MyChart app! Go to the app store, search "MyChart", open the app, select [facility], and log in with your MyChart username and password. ? ?Due to Covid, a mask is required upon entering the hospital/clinic. If you do not have a mask, one will be given to you upon arrival. For doctor visits, patients may have 1 support person aged 18 or older with them. For treatment visits, patients cannot have anyone with them due to current Covid guidelines and our immunocompromised population.  ? ?

## 2022-04-05 LAB — CANCER ANTIGEN 27.29: CA 27.29: 86.3 U/mL — ABNORMAL HIGH (ref 0.0–38.6)

## 2022-04-09 ENCOUNTER — Other Ambulatory Visit: Payer: Self-pay | Admitting: Rheumatology

## 2022-04-09 MED ORDER — AMPHETAMINE-DEXTROAMPHET ER 20 MG PO CP24: 20.0000 mg | ORAL_CAPSULE | Freq: Every day | ORAL | 0 refills | Status: DC

## 2022-04-09 NOTE — Telephone Encounter (Signed)
Next Visit: 06/26/2022  Last Visit: 03/21/2022  Last Fill: 03/08/2022  Dx: Primary insomnia  Current Dose per office note on 03/21/2022: not discussed  Okay to refill Adderall?

## 2022-04-09 NOTE — Telephone Encounter (Signed)
Patient called the office requesting a refill of Adderall 20mg be sent to [facility] on [address]. 

## 2022-05-01 ENCOUNTER — Inpatient Hospital Stay: Payer: 59

## 2022-05-01 ENCOUNTER — Other Ambulatory Visit: Payer: Self-pay

## 2022-05-01 ENCOUNTER — Inpatient Hospital Stay: Payer: 59 | Attending: Oncology

## 2022-05-01 VITALS — BP 121/70 | HR 66 | Temp 98.5°F | Resp 18 | Wt 117.5 lb

## 2022-05-01 DIAGNOSIS — R102 Pelvic and perineal pain: Secondary | ICD-10-CM | POA: Insufficient documentation

## 2022-05-01 DIAGNOSIS — C50511 Malignant neoplasm of lower-outer quadrant of right female breast: Secondary | ICD-10-CM | POA: Insufficient documentation

## 2022-05-01 DIAGNOSIS — Z86718 Personal history of other venous thrombosis and embolism: Secondary | ICD-10-CM

## 2022-05-01 DIAGNOSIS — Z170 Estrogen receptor positive status [ER+]: Secondary | ICD-10-CM | POA: Insufficient documentation

## 2022-05-01 DIAGNOSIS — C787 Secondary malignant neoplasm of liver and intrahepatic bile duct: Secondary | ICD-10-CM | POA: Insufficient documentation

## 2022-05-01 DIAGNOSIS — Z5112 Encounter for antineoplastic immunotherapy: Secondary | ICD-10-CM | POA: Diagnosis present

## 2022-05-01 DIAGNOSIS — Z95828 Presence of other vascular implants and grafts: Secondary | ICD-10-CM

## 2022-05-01 LAB — CMP (CANCER CENTER ONLY)
ALT: 11 U/L (ref 0–44)
AST: 16 U/L (ref 15–41)
Albumin: 4.2 g/dL (ref 3.5–5.0)
Alkaline Phosphatase: 89 U/L (ref 38–126)
Anion gap: 5 (ref 5–15)
BUN: 12 mg/dL (ref 6–20)
CO2: 29 mmol/L (ref 22–32)
Calcium: 9.3 mg/dL (ref 8.9–10.3)
Chloride: 102 mmol/L (ref 98–111)
Creatinine: 1.06 mg/dL — ABNORMAL HIGH (ref 0.44–1.00)
GFR, Estimated: >60 mL/min (ref >60)
Glucose, Bld: 105 mg/dL — ABNORMAL HIGH (ref 70–99)
Potassium: 4.1 mmol/L (ref 3.5–5.1)
Sodium: 136 mmol/L (ref 135–145)
Total Bilirubin: 0.4 mg/dL (ref 0.3–1.2)
Total Protein: 6.7 g/dL (ref 6.5–8.1)

## 2022-05-01 LAB — CBC WITH DIFFERENTIAL (CANCER CENTER ONLY)
Abs Immature Granulocytes: 0 10*3/uL (ref 0.00–0.07)
Basophils Absolute: 0.1 10*3/uL (ref 0.0–0.1)
Basophils Relative: 2 %
Eosinophils Absolute: 0 10*3/uL (ref 0.0–0.5)
Eosinophils Relative: 1 %
HCT: 35.9 % — ABNORMAL LOW (ref 36.0–46.0)
Hemoglobin: 12.2 g/dL (ref 12.0–15.0)
Immature Granulocytes: 0 %
Lymphocytes Relative: 43 %
Lymphs Abs: 1.5 10*3/uL (ref 0.7–4.0)
MCH: 34.6 pg — ABNORMAL HIGH (ref 26.0–34.0)
MCHC: 34 g/dL (ref 30.0–36.0)
MCV: 101.7 fL — ABNORMAL HIGH (ref 80.0–100.0)
Monocytes Absolute: 0.2 10*3/uL (ref 0.1–1.0)
Monocytes Relative: 7 %
Neutro Abs: 1.6 10*3/uL — ABNORMAL LOW (ref 1.7–7.7)
Neutrophils Relative %: 47 %
Platelet Count: 192 10*3/uL (ref 150–400)
RBC: 3.53 MIL/uL — ABNORMAL LOW (ref 3.87–5.11)
RDW: 12.5 % (ref 11.5–15.5)
WBC Count: 3.4 10*3/uL — ABNORMAL LOW (ref 4.0–10.5)
nRBC: 0 % (ref 0.0–0.2)

## 2022-05-01 LAB — PROTIME-INR
INR: 1.4 — ABNORMAL HIGH (ref 0.8–1.2)
Prothrombin Time: 17.1 seconds — ABNORMAL HIGH (ref 11.4–15.2)

## 2022-05-01 MED ORDER — SODIUM CHLORIDE 0.9 % IV SOLN: Freq: Once | INTRAVENOUS | Status: AC

## 2022-05-01 MED ORDER — SODIUM CHLORIDE 0.9% FLUSH
10.0000 mL | INTRAVENOUS | Status: DC | PRN
  Administered 2022-05-01: 10 mL

## 2022-05-01 MED ORDER — ACETAMINOPHEN 325 MG PO TABS
650.0000 mg | ORAL_TABLET | Freq: Once | ORAL | Status: AC
  Administered 2022-05-01: 650 mg via ORAL
  Filled 2022-05-01: qty 2

## 2022-05-01 MED ORDER — TRASTUZUMAB-ANNS CHEMO 150 MG IV SOLR
8.0000 mg/kg | Freq: Once | INTRAVENOUS | Status: AC
  Administered 2022-05-01: 420 mg via INTRAVENOUS
  Filled 2022-05-01: qty 20

## 2022-05-01 MED ORDER — SODIUM CHLORIDE 0.9% FLUSH
10.0000 mL | Freq: Once | INTRAVENOUS | Status: AC
  Administered 2022-05-01: 10 mL

## 2022-05-01 MED ORDER — DIPHENHYDRAMINE HCL 25 MG PO CAPS
25.0000 mg | ORAL_CAPSULE | Freq: Once | ORAL | Status: AC
  Administered 2022-05-01: 25 mg via ORAL
  Filled 2022-05-01: qty 1

## 2022-05-01 MED ORDER — HEPARIN SOD (PORK) LOCK FLUSH 100 UNIT/ML IV SOLN
500.0000 [IU] | Freq: Once | INTRAVENOUS | Status: AC | PRN
  Administered 2022-05-01: 500 [IU]

## 2022-05-02 LAB — CANCER ANTIGEN 27.29: CA 27.29: 97.5 U/mL — ABNORMAL HIGH (ref 0.0–38.6)

## 2022-05-09 ENCOUNTER — Other Ambulatory Visit: Payer: Self-pay | Admitting: Rheumatology

## 2022-05-09 MED ORDER — AMPHETAMINE-DEXTROAMPHET ER 20 MG PO CP24
20.0000 mg | ORAL_CAPSULE | Freq: Every day | ORAL | 0 refills | Status: DC
Start: 2022-05-09 — End: 2022-06-10

## 2022-05-09 NOTE — Telephone Encounter (Signed)
Next Visit: 06/26/2022   Last Visit: 03/21/2022   Last Fill: 04/09/2022   Dx: Primary insomnia   Current Dose per office note on 03/21/2022: not discussed   Okay to refill Adderall?

## 2022-05-09 NOTE — Telephone Encounter (Signed)
Patient called the office requesting a refill of Adderral '20mg'$  be sent to [facility] on [address].

## 2022-05-27 NOTE — Progress Notes (Signed)
[place] OFFICE PROGRESS NOTE  Hilts, Legrand Como, MD [address] [place] [address]  DIAGNOSIS: Stage IV breast cancer triple positive of the right breast  Oncology History  Malignant neoplasm of lower-outer quadrant of right breast of female, estrogen receptor positive ([facility] [place])  08/04/2020 Initial Diagnosis   Right breast lower outer quadrant biopsy 08/04/2020 for a clinical T2 N0 M1, stage IV invasive ductal carcinoma, grade 3, triple positive, with an MIB-1 of 40%. abdominal MRI 08/24/2020 shows multiple liver lesions highly suspicious for liver metastases. Liver biopsy 09/20/2020 positive for carcinoma, prognostic panel estrogen and progesterone receptor positive, but HER-2 negative (0); MIB-1 of 30%   08/31/2020 - 12/14/2020 Chemotherapy   Neoadjuvant chemotherapy consisting of docetaxel, carboplatin, trastuzumab and pertuzumab every 21 days x 6    12/26/2020 Imaging   breast MRI 12/26/2020 showed right breast index lesion decreased to 0.8 cm; 2 additional masses in the right breast stable abdominal MRI 01/29/2021 shows no active disease in the liver    Chemotherapy   Herceptin Maintenance initially q 3 weeks, now q 4 weeks   01/29/2021 -  Anti-estrogen oral therapy   anastrozole and palbociclib    Malignant neoplasm metastatic to liver ([facility])  01/03/2021 Initial Diagnosis   Liver metastases ([facility])   01/03/2021 -  Chemotherapy   Patient is on Treatment Plan : BREAST Trastuzumab q21d      CURRENT THERAPY: Trastuzumab q21d. Today is day 1 cycle #22.   INTERVAL HISTORY: Erica Wood 56 y.o. female returns to clinic today for follow-up visit.  The patient is feeling ***today without any concerning complaints except for ***T?  Dr. Lindi Adie reduced the dose of her ibrance at her last appointment due to fatigue which was more ***. She is currently undergoing treatment with Ibrance and Herceptin.  She denies any changes in her health since last being seen  by Dr. Payton Mccallum on 04/04/2022.  Today she denies any fever, chills, night sweats, or unexplained weight loss.  She denies any chest pain, shortness of breath, cough, or hemoptysis.  Breast lumps no overlying skin changes?  Abdominal pain?  She struggles with chronic constipation for which she takes Dulcolax.  Denies any nausea or vomiting.  Chronic back pain?  Takes Ultram?  She is also followed with rheumatology for ***.  She is scheduled for repeat CT scan to restage her disease in August 2023.  She is here today for evaluation and repeat blood work before undergoing cycle #22 of Herceptin.    MEDICAL HISTORY: Past Medical History:  Diagnosis Date   Anti-cardiolipin antibody positive    Anti-cardiolipin antibody syndrome (HCC)    Anxiety    Arthralgia    Arthritis    bil knees, hands   Atypical chest pain    Autoimmune disease ([facility])    Breast cancer metastasized to liver ([facility]) 09/2020   Cancer ([facility]) 07/2020   right breast IDC   Chronic fatigue    Depression    DVT (deep venous thrombosis) (HCC)    IBS (irritable bowel syndrome)    with constipation   Lupus (HCC)    Pulmonary embolus (HCC)    Raynaud's syndrome    Sicca (HCC)    Thrombocytopenia (HCC)     ALLERGIES:  is allergic to beef-derived products, latex, sulfa antibiotics, and tape.  MEDICATIONS:  Current Outpatient Medications  Medication Sig Dispense Refill   acetaminophen (TYLENOL) 500 MG tablet Take 1,000 mg by mouth every 6 (six) hours as needed for  moderate pain or headache.     amphetamine-dextroamphetamine (ADDERALL XR) 20 MG 24 hr capsule Take 1 capsule (20 mg total) by mouth daily. 30 capsule 0   Ascorbic Acid (VITAMIN C PO) Take 3,000 mg by mouth daily.     Cholecalciferol (VITAMIN D3) 75 MCG (3000 UT) TABS Take 3,000 Units by mouth daily.     Clindamycin-Benzoyl Per, Refr, gel Apply topically.     estradiol (ESTRACE VAGINAL) 0.1 MG/GM vaginal cream Place 1 Applicatorful vaginally 3 (three) times a week. 42.5 g  12   famotidine (PEPCID) 20 MG tablet Take 20 mg by mouth at bedtime.     hydroxychloroquine (PLAQUENIL) 200 MG tablet Take 1 tablet (200 mg total) by mouth daily. 90 tablet 4   letrozole (FEMARA) 2.5 MG tablet Take 1 tablet (2.5 mg total) by mouth daily. 90 tablet 3   lidocaine-prilocaine (EMLA) cream Apply topically.     metoCLOPramide (REGLAN) 10 MG tablet Take 5-10 mg by mouth as needed.     palbociclib (IBRANCE) 75 MG tablet Take 1 tablet (75 mg total) by mouth daily. Take for 21 days on, 7 days off, repeat every 28 days. 21 tablet 6   spironolactone (ALDACTONE) 25 MG tablet Take 25 mg by mouth daily as needed (swelling).     traMADol (ULTRAM) 50 MG tablet Take 1-2 tablets (50-100 mg total) by mouth every 6 (six) hours as needed. 60 tablet 0   tretinoin (RETIN-A) 0.1 % cream Apply 1 application topically at bedtime as needed (acne).      triamterene-hydrochlorothiazide (DYAZIDE) 37.5-25 MG capsule Take 1 each (1 capsule total) by mouth daily. 90 capsule 1   warfarin (COUMADIN) 5 MG tablet TAKE 1 TABLET(5 MG) BY MOUTH DAILY 30 tablet PRN   No current facility-administered medications for this visit.    SURGICAL HISTORY:  Past Surgical History:  Procedure Laterality Date   ABLATION  12/2018   leg veins   ABLATION Right 08/2019   venous leg    APPENDECTOMY     COLON SURGERY     Perforated colon repair after colostomy   EYE SURGERY Bilateral 2022   GANGLION CYST EXCISION     HYSTEROSCOPY WITH D & C N/A 01/13/2014   Procedure: DILATATION AND CURETTAGE /HYSTEROSCOPY with resection ;  Surgeon: Olga Millers, MD;  Location: [facility];  Service: Gynecology;  Laterality: N/A;   LAPAROTOMY     PORTACATH PLACEMENT N/A 09/13/2020   Procedure: INSERTION PORT-A-CATH WITH ULTRASOUND GUIDANCE;  Surgeon: Rolm Bookbinder, MD;  Location: [facility];  Service: General;  Laterality: N/A;    REVIEW OF SYSTEMS:   Review of Systems  Constitutional: Negative for appetite change,  chills, fatigue, fever and unexpected weight change.  HENT:   Negative for mouth sores, nosebleeds, sore throat and trouble swallowing.   Eyes: Negative for eye problems and icterus.  Respiratory: Negative for cough, hemoptysis, shortness of breath and wheezing.   Cardiovascular: Negative for chest pain and leg swelling.  Gastrointestinal: Negative for abdominal pain, constipation, diarrhea, nausea and vomiting.  Genitourinary: Negative for bladder incontinence, difficulty urinating, dysuria, frequency and hematuria.   Musculoskeletal: Negative for back pain, gait problem, neck pain and neck stiffness.  Skin: Negative for itching and rash.  Neurological: Negative for dizziness, extremity weakness, gait problem, headaches, light-headedness and seizures.  Hematological: Negative for adenopathy. Does not bruise/bleed easily.  Psychiatric/Behavioral: Negative for confusion, depression and sleep disturbance. The patient is not nervous/anxious.     PHYSICAL EXAMINATION:  There  were no vitals taken for this visit.  ECOG PERFORMANCE STATUS: {CHL ONC ECOG [id number]  Physical Exam  Constitutional: Oriented to person, place, and time and well-developed, well-nourished, and in no distress. No distress.  HENT:  Head: Normocephalic and atraumatic.  Mouth/Throat: Oropharynx is clear and moist. No oropharyngeal exudate.  Eyes: Conjunctivae are normal. Right eye exhibits no discharge. Left eye exhibits no discharge. No scleral icterus.  Neck: Normal range of motion. Neck supple.  Cardiovascular: Normal rate, regular rhythm, normal heart sounds and intact distal pulses.   Pulmonary/Chest: Effort normal and breath sounds normal. No respiratory distress. No wheezes. No rales.  Abdominal: Soft. Bowel sounds are normal. Exhibits no distension and no mass. There is no tenderness.  Musculoskeletal: Normal range of motion. Exhibits no edema.  Lymphadenopathy:    No cervical adenopathy.  Neurological:  Alert and oriented to person, place, and time. Exhibits normal muscle tone. Gait normal. Coordination normal.  Skin: Skin is warm and dry. No rash noted. Not diaphoretic. No erythema. No pallor.  Psychiatric: Mood, memory and judgment normal.  Vitals reviewed.  LABORATORY DATA: Lab Results  Component Value Date   WBC 3.4 (L) 05/01/2022   HGB 12.2 05/01/2022   HCT 35.9 (L) 05/01/2022   MCV 101.7 (H) 05/01/2022   PLT 192 05/01/2022      Chemistry      Component Value Date/Time   NA 136 05/01/2022 1323   NA 139 12/19/2021 1154   K 4.1 05/01/2022 1323   CL 102 05/01/2022 1323   CO2 29 05/01/2022 1323   BUN 12 05/01/2022 1323   BUN 14 12/19/2021 1154   CREATININE 1.06 (H) 05/01/2022 1323      Component Value Date/Time   CALCIUM 9.3 05/01/2022 1323   ALKPHOS 89 05/01/2022 1323   AST 16 05/01/2022 1323   ALT 11 05/01/2022 1323   BILITOT 0.4 05/01/2022 1323       RADIOGRAPHIC STUDIES:  No results found.   ASSESSMENT/PLAN:  Malignant neoplasm of lower-outer quadrant of right breast of female, estrogen receptor positive ([facility]) 08/04/2020: Right breast biopsy T2 N0 M1 stage IV IDC grade 3, triple positive with a Ki-67 of 40%, multiple liver lesions biopsy was positive for breast cancer ER/PR positive HER2 negative with a Ki-67 30% 08/31/2020-12/14/2020: [facility] x6   Current treatment: Herceptin maintenance every 4 weeks, letrozole and palbociclib (started 01/29/2021) Treatment toxicities: Severe constipation: She had constipation problems even prior to starting treatments.  She is using Dulcolax as needed Gastritis: Dr. Lindi Adie previously sent a prescription for Pepcid Neutropenia: Secondary to Ibrance Severe fatigue: Dr. Lindi Adie reduce the dosage of Ibrance to 75 mg at her last appointment with him on 04/04/22   COVID infection January 2023: Held Saranap and Herceptin treatments.  Resumed 02/06/2022   01/17/2022: CT CAP: Very subtle metastatic lesion of the caudate lobe is unchanged.   No new metastatic disease elsewhere.    Recommendation:  Continue with current management CT CAP in 3 months After that, Dr. Lindi Adie, will make a decision whether to stop the Ibrance or the Herceptin according to his last note.  (If she has had a tremendous response and no concern for progression) Dr. Lindi Adie would recommend that she keep the port in if she were to stop the Herceptin.      No orders of the defined types were placed in this encounter.    I spent {CHL ONC TIME VISIT - [id number]} counseling the patient face to face. The total time spent in the  appointment was {CHL ONC TIME VISIT - [id number]}.  Dameer Speiser L Catrell Morrone, PA-C 05/27/22

## 2022-05-29 ENCOUNTER — Inpatient Hospital Stay: Payer: 59

## 2022-05-29 ENCOUNTER — Other Ambulatory Visit: Payer: Self-pay | Admitting: *Deleted

## 2022-05-29 ENCOUNTER — Other Ambulatory Visit: Payer: Self-pay

## 2022-05-29 ENCOUNTER — Inpatient Hospital Stay (HOSPITAL_BASED_OUTPATIENT_CLINIC_OR_DEPARTMENT_OTHER): Payer: 59 | Admitting: Physician Assistant

## 2022-05-29 ENCOUNTER — Inpatient Hospital Stay: Payer: 59 | Attending: Oncology

## 2022-05-29 VITALS — BP 113/62 | HR 61 | Temp 97.7°F | Resp 16 | Ht 64.0 in | Wt 114.4 lb

## 2022-05-29 VITALS — BP 118/67 | HR 57 | Temp 97.8°F | Resp 16

## 2022-05-29 DIAGNOSIS — Z95828 Presence of other vascular implants and grafts: Secondary | ICD-10-CM

## 2022-05-29 DIAGNOSIS — C787 Secondary malignant neoplasm of liver and intrahepatic bile duct: Secondary | ICD-10-CM | POA: Insufficient documentation

## 2022-05-29 DIAGNOSIS — C50511 Malignant neoplasm of lower-outer quadrant of right female breast: Secondary | ICD-10-CM | POA: Insufficient documentation

## 2022-05-29 DIAGNOSIS — Z79899 Other long term (current) drug therapy: Secondary | ICD-10-CM | POA: Diagnosis not present

## 2022-05-29 DIAGNOSIS — R42 Dizziness and giddiness: Secondary | ICD-10-CM | POA: Diagnosis not present

## 2022-05-29 DIAGNOSIS — Z170 Estrogen receptor positive status [ER+]: Secondary | ICD-10-CM | POA: Diagnosis not present

## 2022-05-29 DIAGNOSIS — Z86718 Personal history of other venous thrombosis and embolism: Secondary | ICD-10-CM | POA: Diagnosis not present

## 2022-05-29 DIAGNOSIS — Z5112 Encounter for antineoplastic immunotherapy: Secondary | ICD-10-CM | POA: Insufficient documentation

## 2022-05-29 DIAGNOSIS — Z86711 Personal history of pulmonary embolism: Secondary | ICD-10-CM | POA: Insufficient documentation

## 2022-05-29 DIAGNOSIS — K130 Diseases of lips: Secondary | ICD-10-CM | POA: Diagnosis not present

## 2022-05-29 LAB — CBC WITH DIFFERENTIAL (CANCER CENTER ONLY)
Abs Immature Granulocytes: 0.01 10*3/uL (ref 0.00–0.07)
Basophils Absolute: 0.1 10*3/uL (ref 0.0–0.1)
Basophils Relative: 3 %
Eosinophils Absolute: 0 10*3/uL (ref 0.0–0.5)
Eosinophils Relative: 1 %
HCT: 35.1 % — ABNORMAL LOW (ref 36.0–46.0)
Hemoglobin: 12.3 g/dL (ref 12.0–15.0)
Immature Granulocytes: 0 %
Lymphocytes Relative: 45 %
Lymphs Abs: 1.1 10*3/uL (ref 0.7–4.0)
MCH: 35.3 pg — ABNORMAL HIGH (ref 26.0–34.0)
MCHC: 35 g/dL (ref 30.0–36.0)
MCV: 100.9 fL — ABNORMAL HIGH (ref 80.0–100.0)
Monocytes Absolute: 0.1 10*3/uL (ref 0.1–1.0)
Monocytes Relative: 6 %
Neutro Abs: 1.1 10*3/uL — ABNORMAL LOW (ref 1.7–7.7)
Neutrophils Relative %: 45 %
Platelet Count: 198 10*3/uL (ref 150–400)
RBC: 3.48 MIL/uL — ABNORMAL LOW (ref 3.87–5.11)
RDW: 12.2 % (ref 11.5–15.5)
WBC Count: 2.5 10*3/uL — ABNORMAL LOW (ref 4.0–10.5)
nRBC: 0 % (ref 0.0–0.2)

## 2022-05-29 LAB — CMP (CANCER CENTER ONLY)
ALT: 14 U/L (ref 0–44)
AST: 18 U/L (ref 15–41)
Albumin: 4.1 g/dL (ref 3.5–5.0)
Alkaline Phosphatase: 84 U/L (ref 38–126)
Anion gap: 3 — ABNORMAL LOW (ref 5–15)
BUN: 13 mg/dL (ref 6–20)
CO2: 29 mmol/L (ref 22–32)
Calcium: 9 mg/dL (ref 8.9–10.3)
Chloride: 106 mmol/L (ref 98–111)
Creatinine: 0.9 mg/dL (ref 0.44–1.00)
GFR, Estimated: >60 mL/min (ref >60)
Glucose, Bld: 85 mg/dL (ref 70–99)
Potassium: 4.1 mmol/L (ref 3.5–5.1)
Sodium: 138 mmol/L (ref 135–145)
Total Bilirubin: 0.3 mg/dL (ref 0.3–1.2)
Total Protein: 6.3 g/dL — ABNORMAL LOW (ref 6.5–8.1)

## 2022-05-29 LAB — PROTIME-INR
INR: 1.4 — ABNORMAL HIGH (ref 0.8–1.2)
Prothrombin Time: 16.8 seconds — ABNORMAL HIGH (ref 11.4–15.2)

## 2022-05-29 MED ORDER — SODIUM CHLORIDE 0.9% FLUSH
10.0000 mL | INTRAVENOUS | Status: DC | PRN
  Administered 2022-05-29: 10 mL

## 2022-05-29 MED ORDER — HEPARIN SOD (PORK) LOCK FLUSH 100 UNIT/ML IV SOLN
500.0000 [IU] | Freq: Once | INTRAVENOUS | Status: AC | PRN
  Administered 2022-05-29: 500 [IU]

## 2022-05-29 MED ORDER — LORAZEPAM 0.5 MG PO TABS: ORAL_TABLET | ORAL | 0 refills | Status: DC

## 2022-05-29 MED ORDER — TRASTUZUMAB-ANNS CHEMO 150 MG IV SOLR
8.0000 mg/kg | Freq: Once | INTRAVENOUS | Status: AC
  Administered 2022-05-29: 420 mg via INTRAVENOUS
  Filled 2022-05-29: qty 20

## 2022-05-29 MED ORDER — DIPHENHYDRAMINE HCL 25 MG PO CAPS
25.0000 mg | ORAL_CAPSULE | Freq: Once | ORAL | Status: AC
  Administered 2022-05-29: 25 mg via ORAL
  Filled 2022-05-29: qty 1

## 2022-05-29 MED ORDER — SODIUM CHLORIDE 0.9 % IV SOLN: Freq: Once | INTRAVENOUS | Status: AC

## 2022-05-29 MED ORDER — ACETAMINOPHEN 325 MG PO TABS
650.0000 mg | ORAL_TABLET | Freq: Once | ORAL | Status: AC
  Administered 2022-05-29: 650 mg via ORAL
  Filled 2022-05-29: qty 2

## 2022-05-29 MED ORDER — LIDOCAINE-PRILOCAINE 2.5-2.5 % EX CREA: TOPICAL_CREAM | Freq: Once | CUTANEOUS | 2 refills | Status: AC

## 2022-05-29 MED ORDER — SODIUM CHLORIDE 0.9% FLUSH
10.0000 mL | Freq: Once | INTRAVENOUS | Status: AC
  Administered 2022-05-29: 10 mL

## 2022-05-29 NOTE — Addendum Note (Signed)
Addended by: Gracelyn Nurse on: 05/29/2022 03:06 PM   Modules accepted: Orders

## 2022-05-29 NOTE — Progress Notes (Signed)
Per MD okay to treat today with echo from February.  New orders placed to repeat echo prior to next tx.

## 2022-05-29 NOTE — Progress Notes (Signed)
Ok to treat patient with ECHO from 12/12/21 per Dr. Lindi Adie- new one to be scheduled soon.

## 2022-05-30 LAB — CANCER ANTIGEN 27.29: CA 27.29: 106.6 U/mL — ABNORMAL HIGH (ref 0.0–38.6)

## 2022-06-03 ENCOUNTER — Other Ambulatory Visit: Payer: Self-pay

## 2022-06-07 ENCOUNTER — Ambulatory Visit (HOSPITAL_COMMUNITY)
Admission: RE | Admit: 2022-06-07 | Discharge: 2022-06-07 | Disposition: A | Discharge status: Home / Self Care | Payer: 59 | Source: Ambulatory Visit | Attending: Hematology and Oncology | Admitting: Hematology and Oncology

## 2022-06-07 DIAGNOSIS — Z79899 Other long term (current) drug therapy: Secondary | ICD-10-CM | POA: Insufficient documentation

## 2022-06-07 DIAGNOSIS — Z5181 Encounter for therapeutic drug level monitoring: Secondary | ICD-10-CM

## 2022-06-07 DIAGNOSIS — Z0189 Encounter for other specified special examinations: Secondary | ICD-10-CM | POA: Diagnosis not present

## 2022-06-07 LAB — ECHOCARDIOGRAM COMPLETE
AV Vena cont: 0.2 cm
Area-P 1/2: 4.8 cm2
Calc EF: 56.5 %
S' Lateral: 3.5 cm
Single Plane A2C EF: 58.1 %
Single Plane A4C EF: 55.9 %

## 2022-06-07 NOTE — Progress Notes (Signed)
  Echocardiogram 2D Echocardiogram has been performed.  Erica Wood 06/07/2022, 9:46 AM

## 2022-06-10 ENCOUNTER — Other Ambulatory Visit: Payer: Self-pay | Admitting: Rheumatology

## 2022-06-10 MED ORDER — AMPHETAMINE-DEXTROAMPHET ER 20 MG PO CP24
20.0000 mg | ORAL_CAPSULE | Freq: Every day | ORAL | 0 refills | Status: DC
Start: 2022-06-10 — End: 2022-07-09

## 2022-06-10 NOTE — Telephone Encounter (Signed)
Next Visit: 06/26/2022   Last Visit: 03/21/2022   Last Fill: 05/09/2022   Dx: Primary insomnia   Current Dose per office note on 03/21/2022: not discussed   Okay to refill Adderall?

## 2022-06-10 NOTE — Telephone Encounter (Signed)
Patient called the office requesting a refill of Adderall '20mg'$  be sent to [facility] on [address].

## 2022-06-13 ENCOUNTER — Ambulatory Visit (HOSPITAL_COMMUNITY)
Admission: RE | Admit: 2022-06-13 | Discharge: 2022-06-13 | Disposition: A | Discharge status: Home / Self Care | Payer: 59 | Source: Ambulatory Visit | Attending: Physician Assistant | Admitting: Physician Assistant

## 2022-06-13 DIAGNOSIS — C787 Secondary malignant neoplasm of liver and intrahepatic bile duct: Secondary | ICD-10-CM | POA: Insufficient documentation

## 2022-06-13 MED ORDER — GADOBUTROL 1 MMOL/ML IV SOLN
5.0000 mL | Freq: Once | INTRAVENOUS | Status: AC | PRN
  Administered 2022-06-13: 5 mL via INTRAVENOUS

## 2022-06-13 NOTE — Progress Notes (Signed)
Office Visit Note  Patient: Erica Wood             Date of Birth: 1966-02-07           MRN: [id number]             PCP: Eunice Blase, MD Referring: Eunice Blase, MD Visit Date: 06/26/2022 Occupation: _0 @  Subjective:  Pain in both hands   History of Present Illness: Erica Wood is a 56 y.o. female with history of systemic lupus erythematosus.  She is taking plaqeunil 200 mg 1 tablet by mouth daily.  She is tolerating plaquenil without any side effects.  She continues to have pain in both hands and both knee joints.  She has ongoing swelling in her hands and left knee joint.  She denies any other joint pain or joint swelling at this time.  She denies any other signs or symptoms of a lupus flare.  She denies any sores in her mouth or noses.  She denies any SOB or pleuritic chest pain. She remains under the care of Dr. Lyndel Safe and remains on immunotherapy for management of breast cancer.     Activities of Daily Living:  Patient reports morning stiffness for 15-30 minutes.   Patient Reports nocturnal pain.  Difficulty dressing/grooming: Denies Difficulty climbing stairs: Reports Difficulty getting out of chair: Reports Difficulty using hands for taps, buttons, cutlery, and/or writing: Reports  Review of Systems  Constitutional:  Positive for fatigue.  HENT:  Negative for mouth sores and mouth dryness.   Eyes:  Positive for dryness.  Respiratory:  Negative for shortness of breath.   Cardiovascular:  Negative for chest pain and palpitations.  Gastrointestinal:  Positive for constipation. Negative for blood in stool and diarrhea.  Endocrine: Negative for increased urination.  Genitourinary:  Negative for involuntary urination.  Musculoskeletal:  Positive for joint pain, joint pain, joint swelling, myalgias, morning stiffness, muscle tenderness and myalgias. Negative for gait problem and muscle weakness.  Skin:  Positive for color change. Negative for rash and  sensitivity to sunlight.  Allergic/Immunologic: Negative for susceptible to infections.  Neurological:  Positive for dizziness and headaches.  Hematological:  Negative for swollen glands.  Psychiatric/Behavioral:  Negative for depressed mood and sleep disturbance. The patient is nervous/anxious.     PMFS History:  Patient Active Problem List   Diagnosis Date Noted   Goals of care, counseling/discussion 05/09/2021   Malignant neoplasm metastatic to liver ([place]) 01/03/2021   Port-A-Cath in place 11/02/2020   Aortic atherosclerosis ([place]) 09/21/2020   Malignant neoplasm of lower-outer quadrant of right breast of female, estrogen receptor positive ([place]) 08/10/2020   Abnormal cervical Papanicolaou smear 07/12/2020   Pulmonary embolism ([place]) 07/12/2020   Fibrocystic disease of breast 01/22/2018   Primary osteoarthritis of both knees 05/02/2017   Raynaud's disease without gangrene 11/26/2016   Discoid lupus 11/26/2016   Anticardiolipin antibody positive 11/26/2016   Autoimmune disease ([place]) 11/25/2016   High risk medication use 11/25/2016   Primary osteoarthritis of both hands 11/25/2016   History of DVT (deep vein thrombosis) 11/25/2016   History of pulmonary embolism 11/25/2016   Primary insomnia 11/25/2016   Anxiety 11/25/2016   Irritable bowel syndrome with constipation 11/25/2016   Raynaud's phenomenon 01/03/2016   Varicose veins of lower extremities with other complications 69/62/9528   Spider veins of both lower extremities 05/24/2014    Past Medical History:  Diagnosis Date   Anti-cardiolipin antibody positive    Anti-cardiolipin antibody syndrome ([place])    Anxiety  Arthralgia    Arthritis    bil knees, hands   Atypical chest pain    Autoimmune disease ([place])    Breast cancer metastasized to liver ([place]) 09/2020   Cancer ([place]) 07/2020   right breast IDC   Chronic fatigue    Depression    DVT (deep venous thrombosis) (HCC)    IBS (irritable bowel syndrome)    with  constipation   Lupus (HCC)    Pulmonary embolus (HCC)    Raynaud's syndrome    [place] (HCC)    Thrombocytopenia (HCC)     Family History  Problem Relation Age of Onset   Rheum arthritis Father    Hemachromatosis Father    Heart Problems Father    Cancer Sister        bone    Colon cancer Neg Hx    Stomach cancer Neg Hx    Esophageal cancer Neg Hx    Pancreatic cancer Neg Hx    Liver disease Neg Hx    Past Surgical History:  Procedure Laterality Date   ABLATION  12/2018   leg veins   ABLATION Right 08/2019   venous leg    APPENDECTOMY     COLON SURGERY     Perforated colon repair after colostomy   EYE SURGERY Bilateral 2022   GANGLION CYST EXCISION     HYSTEROSCOPY WITH D & C N/A 01/13/2014   Procedure: DILATATION AND CURETTAGE /HYSTEROSCOPY with resection ;  Surgeon: Olga Millers, MD;  Location: [facility];  Service: Gynecology;  Laterality: N/A;   LAPAROTOMY     PORTACATH PLACEMENT N/A 09/13/2020   Procedure: INSERTION PORT-A-CATH WITH ULTRASOUND GUIDANCE;  Surgeon: Rolm Bookbinder, MD;  Location: [facility];  Service: General;  Laterality: N/A;   Social History   Social History Narrative   Not on file   Immunization History  Administered Date(s) Administered   PFIZER Comirnaty([place])Covid-19 Tri-Sucrose Vaccine 08/11/2020     Objective: Vital Signs: BP 112/74 (BP Location: Left Arm, Patient Position: Sitting, Cuff Size: Normal)   Pulse 67   Resp 12   Ht 5' 4" (1.626 m)   Wt 113 lb (51.3 kg) Comment: per patient  BMI 19.40 kg/m    Physical Exam Vitals and nursing note reviewed.  Constitutional:      Appearance: She is well-developed.  HENT:     Head: Normocephalic and atraumatic.  Eyes:     Conjunctiva/sclera: Conjunctivae normal.  Cardiovascular:     Rate and Rhythm: Normal rate and regular rhythm.     Heart sounds: Normal heart sounds.  Pulmonary:     Effort: Pulmonary effort is normal.     Breath sounds: Normal breath sounds.   Abdominal:     General: Bowel sounds are normal.     Palpations: Abdomen is soft.  Musculoskeletal:     Cervical back: Normal range of motion.  Skin:    General: Skin is warm and dry.     Capillary Refill: Capillary refill takes less than 2 seconds.  Neurological:     Mental Status: She is alert and oriented to person, place, and time.  Psychiatric:        Behavior: Behavior normal.      Musculoskeletal Exam: C-spine has good ROM.  Shoulder joints, elbow joints, wrist joints have good ROM with no discomfort.  Tenderness and synovitis of bilateral 2nd and 3rd MCP joints.  Complete fist formation bilaterally.  PIP and DIP thickening consistent with osteoarthritis of both  hands.  Hip joints have good ROM with no groin pain.  Knee joints have good ROM with no warmth or effusion.  Ankle joints have good ROM with no tenderness or joint swelling.  No tenderness or synovitis of MTP joints.   CDAI Exam: CDAI Score: -- Patient Global: --; Provider Global: -- Swollen: 4 ; Tender: 4  Joint Exam 06/26/2022      Right  Left  MCP 2  Swollen Tender  Swollen Tender  MCP 3  Swollen Tender  Swollen Tender     Investigation: No additional findings.  Imaging: MR Brain W Wo Contrast  Result Date: 06/13/2022 CLINICAL DATA:  Metastatic breast carcinoma EXAM: MRI HEAD WITHOUT AND WITH CONTRAST TECHNIQUE: Multiplanar, multiecho pulse sequences of the brain and surrounding structures were obtained without and with intravenous contrast. CONTRAST:  77m GADAVIST GADOBUTROL 1 MMOL/ML IV SOLN COMPARISON:  None Available. FINDINGS: Brain: No acute infarct, mass effect or extra-axial collection. No acute or chronic hemorrhage. Normal white matter signal, parenchymal volume and CSF spaces. The midline structures are normal. There is no abnormal contrast enhancement. Vascular: Major flow voids are preserved. Skull and upper cervical spine: Normal calvarium and skull base. Visualized upper cervical spine and soft  tissues are normal. Sinuses/Orbits:No paranasal sinus fluid levels or advanced mucosal thickening. No mastoid or middle ear effusion. Normal orbits. IMPRESSION: Normal brain MRI. No metastatic disease. Electronically Signed   By: KUlyses JarredM.D.   On: 06/13/2022 19:48   ECHOCARDIOGRAM COMPLETE  Result Date: 06/07/2022    ECHOCARDIOGRAM REPORT   [facility] Name[facility]   [facility]ate of Exam: 06/07/2022 Medical Rec #:  [id number]           Height:       64.0 in Accession #:    [id number]          Weight:       114.4 lb Date of Birth:  4Apr 09, 1967           BSA:          1.543 m Patient Age:    89years             BP:           122/75 mmHg Patient Gender: F                    HR:           65 bpm. Exam Location:  Outpatient Procedure: 2D Echo, Cardiac Doppler, Color Doppler and Strain Analysis Indications:    Chemo Z09  History:        Patient has prior history of Echocardiogram examinations, most                 recent 12/12/2021.  Sonographer:    SBernadene PersonRDCS Referring Phys: [id number] LoseIMPRESSIONS  1. Left ventricular ejection fraction, by estimation, is 55 to 60%. Left ventricular ejection fraction by 2D MOD biplane is 56.5 %. The left ventricle has normal function. The left ventricle has no regional wall motion abnormalities. Left ventricular diastolic parameters were normal. The average left ventricular global longitudinal strain is -22.5 %. The global longitudinal strain is normal.  2. Right ventricular systolic function is normal. The right ventricular size is normal. There is normal pulmonary artery systolic pressure. The estimated right ventricular systolic pressure is 289.3mmHg.  3. The mitral valve is grossly normal. Trivial mitral valve regurgitation.  4. The aortic valve is  tricuspid. Aortic valve regurgitation is trivial. Aortic valve sclerosis is present, with no evidence of aortic valve stenosis.  5. The inferior vena cava is normal in size with greater than 50% respiratory  variability, suggesting right atrial pressure of 3 mmHg. Comparison(s): Changes from prior study are noted. 12/12/2021: LVEF 65%, GLS -22%. FINDINGS  Left Ventricle: Left ventricular ejection fraction, by estimation, is 55 to 60%. Left ventricular ejection fraction by 2D MOD biplane is 56.5 %. The left ventricle has normal function. The left ventricle has no regional wall motion abnormalities. The average left ventricular global longitudinal strain is -22.5 %. The global longitudinal strain is normal. The left ventricular internal cavity size was normal in size. There is no left ventricular hypertrophy. Left ventricular diastolic parameters were normal. Right Ventricle: The right ventricular size is normal. No increase in right ventricular wall thickness. Right ventricular systolic function is normal. There is normal pulmonary artery systolic pressure. The tricuspid regurgitant velocity is 2.20 m/s, and  with an assumed right atrial pressure of 3 mmHg, the estimated right ventricular systolic pressure is 37.9 mmHg. Left Atrium: Left atrial size was normal in size. Right Atrium: Right atrial size was normal in size. Pericardium: There is no evidence of pericardial effusion. Mitral Valve: The mitral valve is grossly normal. Trivial mitral valve regurgitation. Tricuspid Valve: The tricuspid valve is grossly normal. Tricuspid valve regurgitation is trivial. Aortic Valve: The aortic valve is tricuspid. Aortic valve regurgitation is trivial. Aortic valve sclerosis is present, with no evidence of aortic valve stenosis. Pulmonic Valve: The pulmonic valve was normal in structure. Pulmonic valve regurgitation is not visualized. Aorta: The aortic root and ascending aorta are structurally normal, with no evidence of dilitation. Venous: The inferior vena cava is normal in size with greater than 50% respiratory variability, suggesting right atrial pressure of 3 mmHg. IAS/Shunts: No atrial level shunt detected by color flow Doppler.   LEFT VENTRICLE PLAX 2D                        Biplane EF (MOD) LVIDd:         4.90 cm         LV Biplane EF:   Left LVIDs:         3.50 cm                          ventricular LV PW:         0.90 cm                          ejection LV IVS:        0.60 cm                          fraction by LVOT diam:     2.00 cm                          2D MOD LV SV:         72                               biplane is LV SV Index:   47  56.5 %. LVOT Area:     3.14 cm                                Diastology                                LV e' medial:    10.50 cm/s LV Volumes (MOD)               LV E/e' medial:  7.9 LV vol d, MOD    85.7 ml       LV e' lateral:   11.60 cm/s A2C:                           LV E/e' lateral: 7.1 LV vol d, MOD    92.0 ml A4C:                           2D LV vol s, MOD    35.9 ml       Longitudinal A2C:                           Strain LV vol s, MOD    40.6 ml       2D Strain GLS  -22.8 % A4C:                           (A2C): LV SV MOD A2C:   49.8 ml       2D Strain GLS  -18.7 % LV SV MOD A4C:   92.0 ml       (A3C): LV SV MOD BP:    50.0 ml       2D Strain GLS  -26.0 %                                (A4C):                                2D Strain GLS  -22.5 %                                Avg: RIGHT VENTRICLE RV S prime:     12.40 cm/s TAPSE (M-mode): 2.7 cm LEFT ATRIUM             Index        RIGHT ATRIUM           Index LA diam:        2.80 cm 1.81 cm/m   RA Area:     15.60 cm LA Vol (A2C):   36.2 ml 23.46 ml/m  RA Volume:   41.20 ml  26.70 ml/m LA Vol (A4C):   32.7 ml 21.19 ml/m LA Biplane Vol: 34.8 ml 22.55 ml/m  AORTIC VALVE                   PULMONIC VALVE LVOT Vmax:         109.00 cm/s PR End Diast Vel: 2.59 msec LVOT Vmean:  70.300 cm/s LVOT VTI:          0.229 m AR Vena Contracta: 0.20 cm  AORTA Ao Root diam: 2.90 cm Ao Asc diam:  3.10 cm MITRAL VALVE               TRICUSPID VALVE MV Area (PHT): 4.80 cm    TR Peak grad:   19.4 mmHg MV Decel Time:  158 msec    TR Vmax:        220.00 cm/s MV E velocity: 82.70 cm/s MV A velocity: 50.60 cm/s  SHUNTS MV E/A ratio:  1.63        Systemic VTI:  0.23 m                            Systemic Diam: 2.00 cm Lyman Bishop MD Electronically signed by Lyman Bishop MD Signature Date/Time: 06/07/2022/4:33:25 PM    Final     Recent Labs: Lab Results  Component Value Date   WBC 2.5 (L) 05/29/2022   HGB 12.3 05/29/2022   PLT 198 05/29/2022   NA 138 05/29/2022   K 4.1 05/29/2022   CL 106 05/29/2022   CO2 29 05/29/2022   GLUCOSE 85 05/29/2022   BUN 13 05/29/2022   CREATININE 0.90 05/29/2022   BILITOT 0.3 05/29/2022   ALKPHOS 84 05/29/2022   AST 18 05/29/2022   ALT 14 05/29/2022   PROT 6.3 (L) 05/29/2022   ALBUMIN 4.1 05/29/2022   CALCIUM 9.0 05/29/2022   GFRAA 95 07/28/2020   QFTBGOLDPLUS Negative 12/19/2021    Speciality Comments: PLQ Eye Exam: 04/02/2022 WNL @ [facility] follow up in 1 year. stage IB invasive ductal carcinoma, grade 3 08/16/20  Procedures:  No procedures performed Allergies: Beef-derived products, Latex, Sulfa antibiotics, and Tape        Assessment / Plan:     Visit Diagnoses: SLE (systemic lupus erythematosus related syndrome) (HCC) - Positive ANA and positive double-stranded DNA, hypocomplementemia, inflammatory arthritis, raynauds phenomenon and discoid lupus: Not currently active.  No signs or symptoms of a systemic lupus flare at this time.  She remains on Plaquenil 200 mg 1 tablet by mouth daily. Lab work on 12/19/21 was reviewed today in the office: ANA negative, dsDNA negative, complements WNL, RF negative, and ESR WNL.  Autoimmune work-up was completely negative. Dr. Estanislado Pandy and I both feel that her inflammatory arthritis is not autoimmune driven.  She is apprehensive to add on low-dose methotrexate or Enbrel at this time. The plan is to initiate prednisone 5 mg 1 tablet daily x1 month then taper to 2.5 mg daily for 1 month then taper to 2.5 mg every  other day for 1 month.  Discussed the risks of long-term prednisone use including worsening osteoporosis.  She voiced understanding. She was advised to notify us if she develops signs or symptoms of a flare.  She was given orders to have updated autoimmune lab work drawn at [facility].  She will remain on the current dose of Plaquenil for now.  She will follow-up in the office in 3 months or sooner if needed. - Plan: ANA, Anti-DNA antibody, double-stranded, C3 and C4, Sedimentation rate, Protein / creatinine ratio, urine  Chronic inflammatory arthritis - RF negative: She has tenderness and synovitis of bilateral second and third MCP joints.  She continues to have chronic pain and intermittent inflammation in both knees especially the left knee.  She remains on Plaquenil 200 mg 1 tablet by mouth  daily.  She has been tolerating Plaquenil without any side effects and has not missed any doses recently. Dr. Dimas Alexandria examined the patient today in the office and does not feel that her ongoing joint pain and inflammation is related to underlying autoimmune disease.  Her autoimmune disease has been an active for several years.  It is likely that her symptoms are due to immunotherapy.   The plan is to keep the patient on low-dose prednisone for now.  She will take prednisone 5 mg 1 tablet daily for 1 month then reduce to 2.5 mg daily x1 month then 2.5 mg every other day for 1 month and then discontinue.  High risk medication use - Plaquenil 200 mg 1 tablet by mouth daily.  CBC and CMP updated on 05/29/22. PLQ Eye Exam: 04/02/2022 WNL @ [facility] follow up in 1 year.  Discoid lupus: No recurrence.   Anticardiolipin antibody positive - Anticardiolipin antibodies were initially positive but were negative with repeat testing.  Patient remains on warfarin.  Malignant neoplasm of lower-outer quadrant of right breast of female, estrogen receptor positive ([place]) - She was diagnosed with triple positive  stage IV breast cancer.  Previous patient of Dr. Jana Hakim and has established care with Dr. Lindi Adie  Raynaud's disease without gangrene: Not currently active.    Primary osteoarthritis of both hands: She has PIP and DIP thickening consistent with osteoarthritis of both hands.  She continues to have chronic pain and stiffness in both hands.  Discussed the importance of joint protection and muscle strengthening.  Primary osteoarthritis of both knees - S/p visco in the past.  MRI of the right knee on 11/14/2021.  She continues to have chronic pain in both knee joints.  She notices intermittent swelling in the left knee.  On examination today no warmth or effusion was noted.  Primary insomnia: She continues to have difficulty sleeping at night.  Other osteoporosis without current pathological fracture - DEXA updated on 05/10/2021: AP spine BMD 0.761 with T score -2.6.  Patient will require treatment for osteoporosis but she has been hesitant to add any additional medications at this time.  Treatment is necessary especially since she will be taking low-dose prednisone.  We will discuss treatment options at her follow-up visit.  Other medical conditions are listed as follows:   History of DVT (deep vein thrombosis)  History of pulmonary embolism  History of anxiety  History of IBS  Vitamin D deficiency  Port-A-Cath in place  Family history of hemochromatosis  Orders: Orders Placed This Encounter  Procedures   ANA   Anti-DNA antibody, double-stranded   C3 and C4   Sedimentation rate   Protein / creatinine ratio, urine   Meds ordered this encounter  Medications   predniSONE (DELTASONE) 5 MG tablet    Sig: Take 1 tablet (5 mg total) by mouth daily with breakfast.    Dispense:  30 tablet    Refill:  0      Follow-Up Instructions: Return in about 3 months (around 09/26/2022) for Systemic lupus erythematosus.   Ofilia Neas, PA-C  Note - This record has been created using Dragon  software.  Chart creation errors have been sought, but may not always  have been located. Such creation errors do not reflect on  the standard of medical care.

## 2022-06-14 ENCOUNTER — Telehealth: Payer: Self-pay | Admitting: Physician Assistant

## 2022-06-14 NOTE — Telephone Encounter (Signed)
I called the patient to let her know there is no evidence for metastatic disease to the brain. She expressed understanding with the results.

## 2022-06-19 ENCOUNTER — Other Ambulatory Visit: Payer: Self-pay

## 2022-06-23 ENCOUNTER — Other Ambulatory Visit: Payer: Self-pay | Admitting: Hematology and Oncology

## 2022-06-24 ENCOUNTER — Encounter: Payer: Self-pay | Admitting: Hematology and Oncology

## 2022-06-26 ENCOUNTER — Inpatient Hospital Stay: Payer: 59

## 2022-06-26 ENCOUNTER — Encounter: Payer: Self-pay | Admitting: Physician Assistant

## 2022-06-26 ENCOUNTER — Ambulatory Visit: Payer: 59 | Attending: Physician Assistant | Admitting: Physician Assistant

## 2022-06-26 VITALS — BP 112/74 | HR 67 | Resp 12 | Ht 64.0 in | Wt 113.0 lb

## 2022-06-26 DIAGNOSIS — F5101 Primary insomnia: Secondary | ICD-10-CM

## 2022-06-26 DIAGNOSIS — Z86711 Personal history of pulmonary embolism: Secondary | ICD-10-CM

## 2022-06-26 DIAGNOSIS — M19041 Primary osteoarthritis, right hand: Secondary | ICD-10-CM

## 2022-06-26 DIAGNOSIS — E559 Vitamin D deficiency, unspecified: Secondary | ICD-10-CM

## 2022-06-26 DIAGNOSIS — M329 Systemic lupus erythematosus, unspecified: Secondary | ICD-10-CM | POA: Diagnosis not present

## 2022-06-26 DIAGNOSIS — Z170 Estrogen receptor positive status [ER+]: Secondary | ICD-10-CM

## 2022-06-26 DIAGNOSIS — L930 Discoid lupus erythematosus: Secondary | ICD-10-CM

## 2022-06-26 DIAGNOSIS — M818 Other osteoporosis without current pathological fracture: Secondary | ICD-10-CM

## 2022-06-26 DIAGNOSIS — M170 Bilateral primary osteoarthritis of knee: Secondary | ICD-10-CM

## 2022-06-26 DIAGNOSIS — M1990 Unspecified osteoarthritis, unspecified site: Secondary | ICD-10-CM

## 2022-06-26 DIAGNOSIS — C50511 Malignant neoplasm of lower-outer quadrant of right female breast: Secondary | ICD-10-CM

## 2022-06-26 DIAGNOSIS — R760 Raised antibody titer: Secondary | ICD-10-CM

## 2022-06-26 DIAGNOSIS — Z8719 Personal history of other diseases of the digestive system: Secondary | ICD-10-CM

## 2022-06-26 DIAGNOSIS — I7300 Raynaud's syndrome without gangrene: Secondary | ICD-10-CM

## 2022-06-26 DIAGNOSIS — Z8349 Family history of other endocrine, nutritional and metabolic diseases: Secondary | ICD-10-CM

## 2022-06-26 DIAGNOSIS — M19042 Primary osteoarthritis, left hand: Secondary | ICD-10-CM

## 2022-06-26 DIAGNOSIS — Z8659 Personal history of other mental and behavioral disorders: Secondary | ICD-10-CM

## 2022-06-26 DIAGNOSIS — Z95828 Presence of other vascular implants and grafts: Secondary | ICD-10-CM

## 2022-06-26 DIAGNOSIS — Z79899 Other long term (current) drug therapy: Secondary | ICD-10-CM | POA: Diagnosis not present

## 2022-06-26 DIAGNOSIS — Z86718 Personal history of other venous thrombosis and embolism: Secondary | ICD-10-CM

## 2022-06-26 MED ORDER — PREDNISONE 5 MG PO TABS: 5.0000 mg | ORAL_TABLET | Freq: Every day | ORAL | 0 refills | Status: DC

## 2022-06-27 ENCOUNTER — Other Ambulatory Visit: Payer: Self-pay

## 2022-06-27 ENCOUNTER — Inpatient Hospital Stay: Payer: 59 | Attending: Oncology

## 2022-06-27 ENCOUNTER — Inpatient Hospital Stay: Payer: 59

## 2022-06-27 VITALS — BP 124/67 | HR 59 | Temp 98.0°F | Resp 17 | Wt 114.6 lb

## 2022-06-27 DIAGNOSIS — Z5112 Encounter for antineoplastic immunotherapy: Secondary | ICD-10-CM | POA: Insufficient documentation

## 2022-06-27 DIAGNOSIS — Z170 Estrogen receptor positive status [ER+]: Secondary | ICD-10-CM | POA: Diagnosis not present

## 2022-06-27 DIAGNOSIS — K130 Diseases of lips: Secondary | ICD-10-CM

## 2022-06-27 DIAGNOSIS — C787 Secondary malignant neoplasm of liver and intrahepatic bile duct: Secondary | ICD-10-CM | POA: Insufficient documentation

## 2022-06-27 DIAGNOSIS — Z79899 Other long term (current) drug therapy: Secondary | ICD-10-CM | POA: Diagnosis not present

## 2022-06-27 DIAGNOSIS — Z95828 Presence of other vascular implants and grafts: Secondary | ICD-10-CM

## 2022-06-27 DIAGNOSIS — C50511 Malignant neoplasm of lower-outer quadrant of right female breast: Secondary | ICD-10-CM | POA: Diagnosis present

## 2022-06-27 LAB — CBC WITH DIFFERENTIAL (CANCER CENTER ONLY)
Abs Immature Granulocytes: 0 10*3/uL (ref 0.00–0.07)
Basophils Absolute: 0.1 10*3/uL (ref 0.0–0.1)
Basophils Relative: 2 %
Eosinophils Absolute: 0 10*3/uL (ref 0.0–0.5)
Eosinophils Relative: 1 %
HCT: 35.7 % — ABNORMAL LOW (ref 36.0–46.0)
Hemoglobin: 12.4 g/dL (ref 12.0–15.0)
Immature Granulocytes: 0 %
Lymphocytes Relative: 42 %
Lymphs Abs: 1.5 10*3/uL (ref 0.7–4.0)
MCH: 34.3 pg — ABNORMAL HIGH (ref 26.0–34.0)
MCHC: 34.7 g/dL (ref 30.0–36.0)
MCV: 98.9 fL (ref 80.0–100.0)
Monocytes Absolute: 0.2 10*3/uL (ref 0.1–1.0)
Monocytes Relative: 6 %
Neutro Abs: 1.8 10*3/uL (ref 1.7–7.7)
Neutrophils Relative %: 49 %
Platelet Count: 218 10*3/uL (ref 150–400)
RBC: 3.61 MIL/uL — ABNORMAL LOW (ref 3.87–5.11)
RDW: 12.1 % (ref 11.5–15.5)
WBC Count: 3.6 10*3/uL — ABNORMAL LOW (ref 4.0–10.5)
nRBC: 0 % (ref 0.0–0.2)

## 2022-06-27 LAB — CMP (CANCER CENTER ONLY)
ALT: 15 U/L (ref 0–44)
AST: 21 U/L (ref 15–41)
Albumin: 4.1 g/dL (ref 3.5–5.0)
Alkaline Phosphatase: 92 U/L (ref 38–126)
Anion gap: 3 — ABNORMAL LOW (ref 5–15)
BUN: 14 mg/dL (ref 6–20)
CO2: 29 mmol/L (ref 22–32)
Calcium: 9 mg/dL (ref 8.9–10.3)
Chloride: 104 mmol/L (ref 98–111)
Creatinine: 0.93 mg/dL (ref 0.44–1.00)
GFR, Estimated: >60 mL/min (ref >60)
Glucose, Bld: 92 mg/dL (ref 70–99)
Potassium: 3.9 mmol/L (ref 3.5–5.1)
Sodium: 136 mmol/L (ref 135–145)
Total Bilirubin: 0.5 mg/dL (ref 0.3–1.2)
Total Protein: 6.5 g/dL (ref 6.5–8.1)

## 2022-06-27 LAB — VITAMIN B12: Vitamin B-12: 797 pg/mL (ref 180–914)

## 2022-06-27 LAB — VITAMIN D 25 HYDROXY (VIT D DEFICIENCY, FRACTURES): Vit D, 25-Hydroxy: 82.08 ng/mL (ref 30–100)

## 2022-06-27 MED ORDER — DIPHENHYDRAMINE HCL 25 MG PO CAPS
25.0000 mg | ORAL_CAPSULE | Freq: Once | ORAL | Status: AC
  Administered 2022-06-27: 25 mg via ORAL
  Filled 2022-06-27: qty 1

## 2022-06-27 MED ORDER — SODIUM CHLORIDE 0.9 % IV SOLN: Freq: Once | INTRAVENOUS | Status: AC

## 2022-06-27 MED ORDER — HEPARIN SOD (PORK) LOCK FLUSH 100 UNIT/ML IV SOLN
500.0000 [IU] | Freq: Once | INTRAVENOUS | Status: AC | PRN
  Administered 2022-06-27: 500 [IU]

## 2022-06-27 MED ORDER — SODIUM CHLORIDE 0.9% FLUSH
10.0000 mL | INTRAVENOUS | Status: DC | PRN
  Administered 2022-06-27: 10 mL

## 2022-06-27 MED ORDER — SODIUM CHLORIDE 0.9% FLUSH
10.0000 mL | Freq: Once | INTRAVENOUS | Status: AC
  Administered 2022-06-27: 10 mL

## 2022-06-27 MED ORDER — ACETAMINOPHEN 325 MG PO TABS
650.0000 mg | ORAL_TABLET | Freq: Once | ORAL | Status: AC
  Administered 2022-06-27: 650 mg via ORAL
  Filled 2022-06-27: qty 2

## 2022-06-27 MED ORDER — TRASTUZUMAB-ANNS CHEMO 150 MG IV SOLR
8.0000 mg/kg | Freq: Once | INTRAVENOUS | Status: AC
  Administered 2022-06-27: 420 mg via INTRAVENOUS
  Filled 2022-06-27: qty 20

## 2022-06-27 NOTE — Patient Instructions (Signed)
[facility]  Discharge Instructions: Thank you for choosing [facility] to provide your oncology and hematology care.   If you have a lab appointment with the Cancer Center, please go directly to the Cancer Center and check in at the registration area.   Wear comfortable clothing and clothing appropriate for easy access to any Portacath or PICC line.   We strive to give you quality time with your provider. You may need to reschedule your appointment if you arrive late (15 or more minutes).  Arriving late affects you and other patients whose appointments are after yours.  Also, if you miss three or more appointments without notifying the office, you may be dismissed from the clinic at the provider's discretion.      For prescription refill requests, have your pharmacy contact our office and allow 72 hours for refills to be completed.    Today you received the following chemotherapy and/or immunotherapy agents: Trastuzumab.       To help prevent nausea and vomiting after your treatment, we encourage you to take your nausea medication as directed.  BELOW ARE SYMPTOMS THAT SHOULD BE REPORTED IMMEDIATELY: *FEVER GREATER THAN 100.4 F (38 C) OR HIGHER *CHILLS OR SWEATING *NAUSEA AND VOMITING THAT IS NOT CONTROLLED WITH YOUR NAUSEA MEDICATION *UNUSUAL SHORTNESS OF BREATH *UNUSUAL BRUISING OR BLEEDING *URINARY PROBLEMS (pain or burning when urinating, or frequent urination) *BOWEL PROBLEMS (unusual diarrhea, constipation, pain near the anus) TENDERNESS IN MOUTH AND THROAT WITH OR WITHOUT PRESENCE OF ULCERS (sore throat, sores in mouth, or a toothache) UNUSUAL RASH, SWELLING OR PAIN  UNUSUAL VAGINAL DISCHARGE OR ITCHING   Items with * indicate a potential emergency and should be followed up as soon as possible or go to the Emergency Department if any problems should occur.  Please show the CHEMOTHERAPY ALERT CARD or IMMUNOTHERAPY ALERT CARD at check-in  to the Emergency Department and triage nurse.  Should you have questions after your visit or need to cancel or reschedule your appointment, please contact [facility]  Dept: [phone]  and follow the prompts.  Office hours are 8:00 a.m. to 4:30 p.m. Monday - Friday. Please note that voicemails left after 4:00 p.m. may not be returned until the following business day.  We are closed weekends and major holidays. You have access to a nurse at all times for urgent questions. Please call the main number to the clinic Dept: [phone] and follow the prompts.   For any non-urgent questions, you may also contact your provider using MyChart. We now offer e-Visits for anyone 18 and older to request care online for non-urgent symptoms. For details visit [email or web address].   Also download the MyChart app! Go to the app store, search "MyChart", open the app, select [facility], and log in with your MyChart username and password.  Masks are optional in the cancer centers. If you would like for your care team to wear a mask while they are taking care of you, please let them know. You may have one support person who is at least 54 years old accompany you for your appointments. 

## 2022-06-28 LAB — CANCER ANTIGEN 27.29: CA 27.29: 136.7 U/mL — ABNORMAL HIGH (ref 0.0–38.6)

## 2022-06-29 LAB — VITAMIN B6: Vitamin B6: 22 ug/L (ref 3.4–65.2)

## 2022-07-02 NOTE — Progress Notes (Signed)
ANA negative.  dsDNA is negative. Complements WNL.  Protein creatinine ratio pending.  ESR WNL.

## 2022-07-03 LAB — ANA: Anti Nuclear Antibody (ANA): NEGATIVE

## 2022-07-03 LAB — SEDIMENTATION RATE: Sed Rate: 4 mm/hr (ref 0–40)

## 2022-07-03 LAB — C3 AND C4
Complement C3, Serum: 97 mg/dL (ref 82–167)
Complement C4, Serum: 21 mg/dL (ref 12–38)

## 2022-07-03 LAB — PROTEIN / CREATININE RATIO, URINE
Creatinine, Urine: 23.9 mg/dL
Protein, Ur: <4 mg/dL

## 2022-07-03 LAB — ANTI-DNA ANTIBODY, DOUBLE-STRANDED: dsDNA Ab: <1 IU/mL (ref 0–9)

## 2022-07-03 NOTE — Progress Notes (Signed)
No proteinuria noted.

## 2022-07-05 ENCOUNTER — Ambulatory Visit (HOSPITAL_COMMUNITY)
Admission: RE | Admit: 2022-07-05 | Discharge: 2022-07-05 | Disposition: A | Discharge status: Home / Self Care | Payer: 59 | Source: Ambulatory Visit | Attending: Hematology and Oncology | Admitting: Hematology and Oncology

## 2022-07-05 DIAGNOSIS — Z170 Estrogen receptor positive status [ER+]: Secondary | ICD-10-CM | POA: Insufficient documentation

## 2022-07-05 DIAGNOSIS — C50511 Malignant neoplasm of lower-outer quadrant of right female breast: Secondary | ICD-10-CM | POA: Insufficient documentation

## 2022-07-05 MED ORDER — IOHEXOL 300 MG/ML  SOLN
100.0000 mL | Freq: Once | INTRAMUSCULAR | Status: AC | PRN
  Administered 2022-07-05: 100 mL via INTRAVENOUS

## 2022-07-05 MED ORDER — SODIUM CHLORIDE (PF) 0.9 % IJ SOLN
INTRAMUSCULAR | Status: AC
  Filled 2022-07-05: qty 50

## 2022-07-09 ENCOUNTER — Other Ambulatory Visit: Payer: Self-pay | Admitting: Rheumatology

## 2022-07-09 MED ORDER — AMPHETAMINE-DEXTROAMPHET ER 20 MG PO CP24: 20.0000 mg | ORAL_CAPSULE | Freq: Every day | ORAL | 0 refills | Status: DC

## 2022-07-09 NOTE — Telephone Encounter (Signed)
Next Visit: 09/24/2022  Last Visit: 06/26/2022  Last Fill: 06/10/2022  DX: Primary insomnia  Current Dose per office note on 06/26/2022: not discussed.   Okay to refill adderall?

## 2022-07-09 NOTE — Telephone Encounter (Signed)
Patient left a voicemail requesting a refill of Adderall '20mg'$  be sent to [facility] on [address].

## 2022-07-21 NOTE — Progress Notes (Signed)
Patient Care Team: Eunice Blase, MD as PCP - General (Family Medicine) Prudence Davidson, Volga Bing, MD as Referring Physician (Oncology) Rockwell Germany, RN as Oncology Nurse Navigator Mauro Kaufmann, RN as Oncology Nurse Navigator Rolm Bookbinder, MD as Consulting Physician (General Surgery) Kyung Rudd, MD as Consulting Physician (Radiation Oncology) Bo Merino, MD as Consulting Physician (Rheumatology) Olga Millers, MD as Consulting Physician (Obstetrics and Gynecology) Larey Dresser, MD as Consulting Physician (Cardiology) Armbruster, Carlota Raspberry, MD as Consulting Physician (Gastroenterology) Nena Jordan, MD as Referring Physician (Ophthalmology) Nena Jordan, MD as Referring Physician (Ophthalmology) Nicholas Lose, MD as Consulting Physician (Hematology and Oncology)  DIAGNOSIS: No diagnosis found.  SUMMARY OF ONCOLOGIC HISTORY: Oncology History  Malignant neoplasm of lower-outer quadrant of right breast of female, estrogen receptor positive ([place])  08/04/2020 Initial Diagnosis   Right breast lower outer quadrant biopsy 08/04/2020 for a clinical T2 N0 M1, stage IV invasive ductal carcinoma, grade 3, triple positive, with an MIB-1 of 40%. abdominal MRI 08/24/2020 shows multiple liver lesions highly suspicious for liver metastases. Liver biopsy 09/20/2020 positive for carcinoma, prognostic panel estrogen and progesterone receptor positive, but HER-2 negative (0); MIB-1 of 30%   08/31/2020 - 12/14/2020 Chemotherapy   Neoadjuvant chemotherapy consisting of docetaxel, carboplatin, trastuzumab and pertuzumab every 21 days x 6    12/26/2020 Imaging   breast MRI 12/26/2020 showed right breast index lesion decreased to 0.8 cm; 2 additional masses in the right breast stable abdominal MRI 01/29/2021 shows no active disease in the liver    Chemotherapy   Herceptin Maintenance initially q 3 weeks, now q 4 weeks   01/29/2021 -  Anti-estrogen oral therapy   anastrozole and  palbociclib    Malignant neoplasm metastatic to liver ([place])  01/03/2021 Initial Diagnosis   Liver metastases ([place])   01/03/2021 -  Chemotherapy   Patient is on Treatment Plan : BREAST Trastuzumab q21d       CHIEF COMPLIANT: Follow-up Metastatic breast  breast cancer    INTERVAL HISTORY: Any Mcneice is a 56 y.o. with above-mentioned history of  Metastatic breast cancer, currently on chemotherapy with Herceptin. She presents to the clinic today for a follow-up.  ALLERGIES:  is allergic to beef-derived products, latex, sulfa antibiotics, and tape.  MEDICATIONS:  Current Outpatient Medications  Medication Sig Dispense Refill   acetaminophen (TYLENOL) 500 MG tablet Take 1,000 mg by mouth every 6 (six) hours as needed for moderate pain or headache.     amphetamine-dextroamphetamine (ADDERALL XR) 20 MG 24 hr capsule Take 1 capsule (20 mg total) by mouth daily. 30 capsule 0   Ascorbic Acid (VITAMIN C PO) Take 3,000 mg by mouth daily.     Cholecalciferol (VITAMIN D3) 75 MCG (3000 UT) TABS Take 3,000 Units by mouth daily.     Clindamycin-Benzoyl Per, Refr, gel Apply topically.     estradiol (ESTRACE VAGINAL) 0.1 MG/GM vaginal cream Place 1 Applicatorful vaginally 3 (three) times a week. 42.5 g 12   famotidine (PEPCID) 20 MG tablet Take 20 mg by mouth at bedtime.     hydroxychloroquine (PLAQUENIL) 200 MG tablet Take 1 tablet (200 mg total) by mouth daily. 90 tablet 4   letrozole (FEMARA) 2.5 MG tablet Take 1 tablet (2.5 mg total) by mouth daily. 90 tablet 3   LORazepam (ATIVAN) 0.5 MG tablet Take 1 tablet before your upcoming MRI for claustrophobia (Patient not taking: Reported on 06/26/2022) 2 tablet 0   metoCLOPramide (REGLAN) 10 MG tablet Take 5-10 mg by  mouth as needed.     palbociclib (IBRANCE) 75 MG tablet Take 1 tablet (75 mg total) by mouth daily. Take for 21 days on, 7 days off, repeat every 28 days. 21 tablet 6   predniSONE (DELTASONE) 5 MG tablet Take 1 tablet (5 mg total) by  mouth daily with breakfast. 30 tablet 0   spironolactone (ALDACTONE) 25 MG tablet Take 25 mg by mouth daily as needed (swelling).     traMADol (ULTRAM) 50 MG tablet Take 1-2 tablets (50-100 mg total) by mouth every 6 (six) hours as needed. 60 tablet 0   tretinoin (RETIN-A) 0.1 % cream Apply 1 application topically at bedtime as needed (acne).      triamterene-hydrochlorothiazide (DYAZIDE) 37.5-25 MG capsule TAKE ONE CAPSULE BY MOUTH DAILY 90 capsule 1   warfarin (COUMADIN) 5 MG tablet TAKE 1 TABLET(5 MG) BY MOUTH DAILY 30 tablet PRN   No current facility-administered medications for this visit.    PHYSICAL EXAMINATION: ECOG PERFORMANCE STATUS: {CHL ONC ECOG PS:[phone]}  There were no vitals filed for this visit. There were no vitals filed for this visit.  BREAST:*** No palpable masses or nodules in either right or left breasts. No palpable axillary supraclavicular or infraclavicular adenopathy no breast tenderness or nipple discharge. (exam performed in the presence of a chaperone)  LABORATORY DATA:  I have reviewed the data as listed    Latest Ref Rng & Units 06/27/2022   12:47 PM 05/29/2022   11:20 AM 05/01/2022    1:23 PM  CMP  Glucose 70 - 99 mg/dL 92  85  105   BUN 6 - 20 mg/dL 14  13  12    Creatinine 0.44 - 1.00 mg/dL 0.93  0.90  1.06   Sodium 135 - 145 mmol/L 136  138  136   Potassium 3.5 - 5.1 mmol/L 3.9  4.1  4.1   Chloride 98 - 111 mmol/L 104  106  102   CO2 22 - 32 mmol/L 29  29  29    Calcium 8.9 - 10.3 mg/dL 9.0  9.0  9.3   Total Protein 6.5 - 8.1 g/dL 6.5  6.3  6.7   Total Bilirubin 0.3 - 1.2 mg/dL 0.5  0.3  0.4   Alkaline Phos 38 - 126 U/L 92  84  89   AST 15 - 41 U/L 21  18  16    ALT 0 - 44 U/L 15  14  11      Lab Results  Component Value Date   WBC 3.6 (L) 06/27/2022   HGB 12.4 06/27/2022   HCT 35.7 (L) 06/27/2022   MCV 98.9 06/27/2022   PLT 218 06/27/2022   NEUTROABS 1.8 06/27/2022    ASSESSMENT & PLAN:  No problem-specific Assessment & Plan notes  found for this encounter.    No orders of the defined types were placed in this encounter.  The patient has a good understanding of the overall plan. she agrees with it. she will call with any problems that may develop before the next visit here. Total time spent: 30 mins including face to face time and time spent for planning, charting and co-ordination of care   Suzzette Righter, Lily 07/21/22    I Gardiner Coins am scribing for Dr. Lindi Adie  ***

## 2022-07-24 ENCOUNTER — Inpatient Hospital Stay (HOSPITAL_BASED_OUTPATIENT_CLINIC_OR_DEPARTMENT_OTHER): Payer: 59 | Admitting: Hematology and Oncology

## 2022-07-24 ENCOUNTER — Other Ambulatory Visit: Payer: Self-pay

## 2022-07-24 ENCOUNTER — Inpatient Hospital Stay: Payer: 59 | Attending: Oncology

## 2022-07-24 ENCOUNTER — Inpatient Hospital Stay: Payer: 59

## 2022-07-24 ENCOUNTER — Inpatient Hospital Stay: Payer: 59 | Admitting: Hematology and Oncology

## 2022-07-24 VITALS — BP 122/70 | HR 68 | Temp 97.3°F | Resp 17 | Ht 64.0 in | Wt 114.7 lb

## 2022-07-24 DIAGNOSIS — Z86718 Personal history of other venous thrombosis and embolism: Secondary | ICD-10-CM

## 2022-07-24 DIAGNOSIS — C787 Secondary malignant neoplasm of liver and intrahepatic bile duct: Secondary | ICD-10-CM

## 2022-07-24 DIAGNOSIS — Z5112 Encounter for antineoplastic immunotherapy: Secondary | ICD-10-CM | POA: Insufficient documentation

## 2022-07-24 DIAGNOSIS — C50511 Malignant neoplasm of lower-outer quadrant of right female breast: Secondary | ICD-10-CM

## 2022-07-24 DIAGNOSIS — Z170 Estrogen receptor positive status [ER+]: Secondary | ICD-10-CM | POA: Insufficient documentation

## 2022-07-24 DIAGNOSIS — Z7901 Long term (current) use of anticoagulants: Secondary | ICD-10-CM | POA: Diagnosis not present

## 2022-07-24 DIAGNOSIS — Z95828 Presence of other vascular implants and grafts: Secondary | ICD-10-CM

## 2022-07-24 LAB — CBC WITH DIFFERENTIAL (CANCER CENTER ONLY)
Abs Immature Granulocytes: 0.01 10*3/uL (ref 0.00–0.07)
Basophils Absolute: 0.1 10*3/uL (ref 0.0–0.1)
Basophils Relative: 2 %
Eosinophils Absolute: 0 10*3/uL (ref 0.0–0.5)
Eosinophils Relative: 1 %
HCT: 37.1 % (ref 36.0–46.0)
Hemoglobin: 12.4 g/dL (ref 12.0–15.0)
Immature Granulocytes: 0 %
Lymphocytes Relative: 40 %
Lymphs Abs: 1.5 10*3/uL (ref 0.7–4.0)
MCH: 33.6 pg (ref 26.0–34.0)
MCHC: 33.4 g/dL (ref 30.0–36.0)
MCV: 100.5 fL — ABNORMAL HIGH (ref 80.0–100.0)
Monocytes Absolute: 0.3 10*3/uL (ref 0.1–1.0)
Monocytes Relative: 8 %
Neutro Abs: 1.9 10*3/uL (ref 1.7–7.7)
Neutrophils Relative %: 49 %
Platelet Count: 233 10*3/uL (ref 150–400)
RBC: 3.69 MIL/uL — ABNORMAL LOW (ref 3.87–5.11)
RDW: 12.8 % (ref 11.5–15.5)
WBC Count: 3.8 10*3/uL — ABNORMAL LOW (ref 4.0–10.5)
nRBC: 0 % (ref 0.0–0.2)

## 2022-07-24 LAB — CMP (CANCER CENTER ONLY)
ALT: 18 U/L (ref 0–44)
AST: 20 U/L (ref 15–41)
Albumin: 4 g/dL (ref 3.5–5.0)
Alkaline Phosphatase: 75 U/L (ref 38–126)
Anion gap: 5 (ref 5–15)
BUN: 12 mg/dL (ref 6–20)
CO2: 29 mmol/L (ref 22–32)
Calcium: 9.1 mg/dL (ref 8.9–10.3)
Chloride: 105 mmol/L (ref 98–111)
Creatinine: 0.9 mg/dL (ref 0.44–1.00)
GFR, Estimated: >60 mL/min (ref >60)
Glucose, Bld: 91 mg/dL (ref 70–99)
Potassium: 4 mmol/L (ref 3.5–5.1)
Sodium: 139 mmol/L (ref 135–145)
Total Bilirubin: 0.6 mg/dL (ref 0.3–1.2)
Total Protein: 6.5 g/dL (ref 6.5–8.1)

## 2022-07-24 LAB — PROTIME-INR
INR: 1.9 — ABNORMAL HIGH (ref 0.8–1.2)
Prothrombin Time: 21.8 seconds — ABNORMAL HIGH (ref 11.4–15.2)

## 2022-07-24 MED ORDER — SODIUM CHLORIDE 0.9 % IV SOLN: Freq: Once | INTRAVENOUS | Status: AC

## 2022-07-24 MED ORDER — SODIUM CHLORIDE 0.9% FLUSH
10.0000 mL | Freq: Once | INTRAVENOUS | Status: AC
  Administered 2022-07-24: 10 mL

## 2022-07-24 MED ORDER — DIPHENHYDRAMINE HCL 25 MG PO CAPS
25.0000 mg | ORAL_CAPSULE | Freq: Once | ORAL | Status: AC
  Administered 2022-07-24: 25 mg via ORAL
  Filled 2022-07-24: qty 1

## 2022-07-24 MED ORDER — ACETAMINOPHEN 325 MG PO TABS
650.0000 mg | ORAL_TABLET | Freq: Once | ORAL | Status: AC
  Administered 2022-07-24: 650 mg via ORAL
  Filled 2022-07-24: qty 2

## 2022-07-24 MED ORDER — TRASTUZUMAB-ANNS CHEMO 150 MG IV SOLR
8.0000 mg/kg | Freq: Once | INTRAVENOUS | Status: AC
  Administered 2022-07-24: 420 mg via INTRAVENOUS
  Filled 2022-07-24: qty 20

## 2022-07-24 MED ORDER — SODIUM CHLORIDE 0.9% FLUSH
10.0000 mL | INTRAVENOUS | Status: DC | PRN
  Administered 2022-07-24: 10 mL

## 2022-07-24 MED ORDER — HEPARIN SOD (PORK) LOCK FLUSH 100 UNIT/ML IV SOLN
500.0000 [IU] | Freq: Once | INTRAVENOUS | Status: AC | PRN
  Administered 2022-07-24: 500 [IU]

## 2022-07-24 NOTE — Patient Instructions (Signed)
[facility] MEDICAL [facility]COLOG[facility]  Discharge Instructions: Thank you for choosing [facility] to provide your oncology and hematology care.   If you have a lab appointment with the Cancer Center, please go directly to the Cancer Center and check in at the registration area.   Wear comfortable clothing and clothing appropriate for easy access to any Portacath or PICC line.   We strive to give you quality time with your provider. You may need to reschedule your appointment if you arrive late (15 or more minutes).  Arriving late affects you and other patients whose appointments are after yours.  Also, if you miss three or more appointments without notifying the office, you may be dismissed from the clinic at the provider's discretion.      For prescription refill requests, have your pharmacy contact our office and allow 72 hours for refills to be completed.    Today you received the following chemotherapy and/or immunotherapy agents: trastuzumab-anns      To help prevent nausea and vomiting after your treatment, we encourage you to take your nausea medication as directed.  BELOW ARE SYMPTOMS THAT SHOULD BE REPORTED IMMEDIATELY: *FEVER GREATER THAN 100.4 F (38 C) OR HIGHER *CHILLS OR SWEATING *NAUSEA AND VOMITING THAT IS NOT CONTROLLED WITH YOUR NAUSEA MEDICATION *UNUSUAL SHORTNESS OF BREATH *UNUSUAL BRUISING OR BLEEDING *URINARY PROBLEMS (pain or burning when urinating, or frequent urination) *BOWEL PROBLEMS (unusual diarrhea, constipation, pain near the anus) TENDERNESS IN MOUTH AND THROAT WITH OR WITHOUT PRESENCE OF ULCERS (sore throat, sores in mouth, or a toothache) UNUSUAL RASH, SWELLING OR PAIN  UNUSUAL VAGINAL DISCHARGE OR ITCHING   Items with * indicate a potential emergency and should be followed up as soon as possible or go to the Emergency Department if any problems should occur.  Please show the CHEMOTHERAPY ALERT CARD or IMMUNOTHERAPY ALERT CARD at  check-in to the Emergency Department and triage nurse.  Should you have questions after your visit or need to cancel or reschedule your appointment, please contact [facility]  Dept: [phone]  and follow the prompts.  Office hours are 8:00 a.m. to 4:30 p.m. Monday - Friday. Please note that voicemails left after 4:00 p.m. may not be returned until the following business day.  We are closed weekends and major holidays. You have access to a nurse at all times for urgent questions. Please call the main number to the clinic Dept: [phone] and follow the prompts.   For any non-urgent questions, you may also contact your provider using MyChart. We now offer e-Visits for anyone 18 and older to request care online for non-urgent symptoms. For details visit [email or web address].   Also download the MyChart app! Go to the app store, search "MyChart", open the app, select [place], and log in with your MyChart username and password.  Masks are optional in the cancer centers. If you would like for your care team to wear a mask while they are taking care of you, please let them know. You may have one support person who is at least 54 years old accompany you for your appointments. 

## 2022-07-24 NOTE — Assessment & Plan Note (Addendum)
08/04/2020: Right breast biopsy T2 N0 M1 stage IV IDC grade 3, triple positive with a Ki-67 of 40%, multiple liver lesions biopsy was positive for breast cancer ER/PR positive HER2 negative with a Ki-67 30% 08/31/2020-12/14/2020: [facility] x6  Current treatment: Herceptin maintenance every 4 weeks,letrozoleand palbociclib (started 01/29/2021) Treatment toxicities: 1. Severe constipation: She had constipation problems even prior to starting treatments. She is using Dulcolax as needed 2. Gastritis: I sent a prescription for Pepcid 3. Neutropenia: Secondary to Ibrance 4. Severe fatigue: I reduce the dosage of Ibrance to 75 mg today  COVID infection January 2023: Held Erica Wood and Herceptin treatments.  Resumed 02/06/2022  01/17/2022: CT CAP: Very subtle metastatic lesion of the caudate lobe is unchanged. No new metastatic disease elsewhere.  07/06/22: CT CAP: Stable very subtle metastatic lesion in the caudate lobe of the liver. No new metastatic disease elsewhere.  06/13/2022: Brain MRI: Negative   Discussion: We discussed different options for de-escalation of therapy and we decided to change Ibrance 75 mg 3 weeks on 2 weeks off as well as Herceptin to every 6 weeks.  We will reassess her with CT scans in 3 months and follow-up after that to discuss results

## 2022-07-25 LAB — CANCER ANTIGEN 27.29: CA 27.29: 234.8 U/mL — ABNORMAL HIGH (ref 0.0–38.6)

## 2022-07-26 ENCOUNTER — Other Ambulatory Visit: Payer: Self-pay

## 2022-07-30 ENCOUNTER — Other Ambulatory Visit: Payer: Self-pay

## 2022-08-08 ENCOUNTER — Other Ambulatory Visit: Payer: Self-pay

## 2022-08-08 ENCOUNTER — Other Ambulatory Visit: Payer: Self-pay | Admitting: Rheumatology

## 2022-08-08 MED ORDER — AMPHETAMINE-DEXTROAMPHET ER 20 MG PO CP24: 20.0000 mg | ORAL_CAPSULE | Freq: Every day | ORAL | 0 refills | Status: DC

## 2022-08-08 NOTE — Telephone Encounter (Signed)
Next Visit: 09/24/2022   Last Visit: 06/26/2022   Last Fill: 07/09/2022   DX: Primary insomnia   Current Dose per office note on 06/26/2022: not discussed.    Okay to refill adderall?

## 2022-08-08 NOTE — Telephone Encounter (Signed)
Patient called the office requesting a refill of Adderall '20mg'$  be sent to [facility] on [address].

## 2022-08-21 ENCOUNTER — Other Ambulatory Visit: Payer: 59

## 2022-08-21 ENCOUNTER — Ambulatory Visit: Payer: 59

## 2022-09-04 ENCOUNTER — Inpatient Hospital Stay: Payer: 59

## 2022-09-04 ENCOUNTER — Other Ambulatory Visit: Payer: Self-pay | Admitting: *Deleted

## 2022-09-04 ENCOUNTER — Other Ambulatory Visit: Payer: Self-pay

## 2022-09-04 ENCOUNTER — Inpatient Hospital Stay: Payer: 59 | Attending: Oncology

## 2022-09-04 VITALS — BP 118/62 | HR 63 | Temp 97.8°F | Resp 18 | Wt 111.2 lb

## 2022-09-04 DIAGNOSIS — C50511 Malignant neoplasm of lower-outer quadrant of right female breast: Secondary | ICD-10-CM

## 2022-09-04 DIAGNOSIS — Z5112 Encounter for antineoplastic immunotherapy: Secondary | ICD-10-CM | POA: Insufficient documentation

## 2022-09-04 DIAGNOSIS — Z86718 Personal history of other venous thrombosis and embolism: Secondary | ICD-10-CM | POA: Insufficient documentation

## 2022-09-04 DIAGNOSIS — Z5181 Encounter for therapeutic drug level monitoring: Secondary | ICD-10-CM

## 2022-09-04 DIAGNOSIS — Z95828 Presence of other vascular implants and grafts: Secondary | ICD-10-CM

## 2022-09-04 DIAGNOSIS — C787 Secondary malignant neoplasm of liver and intrahepatic bile duct: Secondary | ICD-10-CM | POA: Diagnosis present

## 2022-09-04 LAB — CBC WITH DIFFERENTIAL (CANCER CENTER ONLY)
Abs Immature Granulocytes: 0 10*3/uL (ref 0.00–0.07)
Basophils Absolute: 0.1 10*3/uL (ref 0.0–0.1)
Basophils Relative: 2 %
Eosinophils Absolute: 0 10*3/uL (ref 0.0–0.5)
Eosinophils Relative: 1 %
HCT: 37.1 % (ref 36.0–46.0)
Hemoglobin: 12.9 g/dL (ref 12.0–15.0)
Immature Granulocytes: 0 %
Lymphocytes Relative: 27 %
Lymphs Abs: 1.3 10*3/uL (ref 0.7–4.0)
MCH: 34.3 pg — ABNORMAL HIGH (ref 26.0–34.0)
MCHC: 34.8 g/dL (ref 30.0–36.0)
MCV: 98.7 fL (ref 80.0–100.0)
Monocytes Absolute: 0.3 10*3/uL (ref 0.1–1.0)
Monocytes Relative: 5 %
Neutro Abs: 3.2 10*3/uL (ref 1.7–7.7)
Neutrophils Relative %: 65 %
Platelet Count: 141 10*3/uL — ABNORMAL LOW (ref 150–400)
RBC: 3.76 MIL/uL — ABNORMAL LOW (ref 3.87–5.11)
RDW: 12.7 % (ref 11.5–15.5)
WBC Count: 4.9 10*3/uL (ref 4.0–10.5)
nRBC: 0 % (ref 0.0–0.2)

## 2022-09-04 LAB — CMP (CANCER CENTER ONLY)
ALT: 20 U/L (ref 0–44)
AST: 26 U/L (ref 15–41)
Albumin: 4 g/dL (ref 3.5–5.0)
Alkaline Phosphatase: 76 U/L (ref 38–126)
Anion gap: 6 (ref 5–15)
BUN: 14 mg/dL (ref 6–20)
CO2: 28 mmol/L (ref 22–32)
Calcium: 8.7 mg/dL — ABNORMAL LOW (ref 8.9–10.3)
Chloride: 105 mmol/L (ref 98–111)
Creatinine: 0.97 mg/dL (ref 0.44–1.00)
GFR, Estimated: >60 mL/min (ref >60)
Glucose, Bld: 93 mg/dL (ref 70–99)
Potassium: 4.1 mmol/L (ref 3.5–5.1)
Sodium: 139 mmol/L (ref 135–145)
Total Bilirubin: 0.5 mg/dL (ref 0.3–1.2)
Total Protein: 6.7 g/dL (ref 6.5–8.1)

## 2022-09-04 LAB — PROTIME-INR
INR: 1.2 (ref 0.8–1.2)
Prothrombin Time: 15 seconds (ref 11.4–15.2)

## 2022-09-04 MED ORDER — SODIUM CHLORIDE 0.9% FLUSH
10.0000 mL | Freq: Once | INTRAVENOUS | Status: AC
  Administered 2022-09-04: 10 mL

## 2022-09-04 MED ORDER — SODIUM CHLORIDE 0.9 % IV SOLN: Freq: Once | INTRAVENOUS | Status: AC

## 2022-09-04 MED ORDER — HEPARIN SOD (PORK) LOCK FLUSH 100 UNIT/ML IV SOLN
500.0000 [IU] | Freq: Once | INTRAVENOUS | Status: AC | PRN
  Administered 2022-09-04: 500 [IU]

## 2022-09-04 MED ORDER — ACETAMINOPHEN 325 MG PO TABS
650.0000 mg | ORAL_TABLET | Freq: Once | ORAL | Status: AC
  Administered 2022-09-04: 650 mg via ORAL
  Filled 2022-09-04: qty 2

## 2022-09-04 MED ORDER — DIPHENHYDRAMINE HCL 25 MG PO CAPS
25.0000 mg | ORAL_CAPSULE | Freq: Once | ORAL | Status: AC
  Administered 2022-09-04: 25 mg via ORAL
  Filled 2022-09-04: qty 1

## 2022-09-04 MED ORDER — TRASTUZUMAB-ANNS CHEMO 150 MG IV SOLR
8.0000 mg/kg | Freq: Once | INTRAVENOUS | Status: AC
  Administered 2022-09-04: 420 mg via INTRAVENOUS
  Filled 2022-09-04: qty 20

## 2022-09-04 MED ORDER — SODIUM CHLORIDE 0.9% FLUSH
10.0000 mL | INTRAVENOUS | Status: DC | PRN
  Administered 2022-09-04: 10 mL

## 2022-09-04 NOTE — Patient Instructions (Signed)
[facility]ICAL [facility]COLOG[facility]  Discharge Instructions: Thank you for choosing [facility] to provide your oncology and hematology care.   If you have a lab appointment with the Cancer Center, please go directly to the Cancer Center and check in at the registration area.   Wear comfortable clothing and clothing appropriate for easy access to any Portacath or PICC line.   We strive to give you quality time with your provider. You may need to reschedule your appointment if you arrive late (15 or more minutes).  Arriving late affects you and other patients whose appointments are after yours.  Also, if you miss three or more appointments without notifying the office, you may be dismissed from the clinic at the provider's discretion.      For prescription refill requests, have your pharmacy contact our office and allow 72 hours for refills to be completed.    Today you received the following chemotherapy and/or immunotherapy agents: trastuzumab-anns      To help prevent nausea and vomiting after your treatment, we encourage you to take your nausea medication as directed.  BELOW ARE SYMPTOMS THAT SHOULD BE REPORTED IMMEDIATELY: *FEVER GREATER THAN 100.4 F (38 C) OR HIGHER *CHILLS OR SWEATING *NAUSEA AND VOMITING THAT IS NOT CONTROLLED WITH YOUR NAUSEA MEDICATION *UNUSUAL SHORTNESS OF BREATH *UNUSUAL BRUISING OR BLEEDING *URINARY PROBLEMS (pain or burning when urinating, or frequent urination) *BOWEL PROBLEMS (unusual diarrhea, constipation, pain near the anus) TENDERNESS IN MOUTH AND THROAT WITH OR WITHOUT PRESENCE OF ULCERS (sore throat, sores in mouth, or a toothache) UNUSUAL RASH, SWELLING OR PAIN  UNUSUAL VAGINAL DISCHARGE OR ITCHING   Items with * indicate a potential emergency and should be followed up as soon as possible or go to the Emergency Department if any problems should occur.  Please show the CHEMOTHERAPY ALERT CARD or IMMUNOTHERAPY ALERT CARD at  check-in to the Emergency Department and triage nurse.  Should you have questions after your visit or need to cancel or reschedule your appointment, please contact [facility]  Dept: [phone]  and follow the prompts.  Office hours are 8:00 a.m. to 4:30 p.m. Monday - Friday. Please note that voicemails left after 4:00 p.m. may not be returned until the following business day.  We are closed weekends and major holidays. You have access to a nurse at all times for urgent questions. Please call the main number to the clinic Dept: [phone] and follow the prompts.   For any non-urgent questions, you may also contact your provider using MyChart. We now offer e-Visits for anyone 18 and older to request care online for non-urgent symptoms. For details visit [email or web address].   Also download the MyChart app! Go to the app store, search "MyChart", open the app, select [place], and log in with your MyChart username and password.  Masks are optional in the cancer centers. If you would like for your care team to wear a mask while they are taking care of you, please let them know. You may have one support Mani Celestin who is at least 54 years old accompany you for your appointments. 

## 2022-09-05 LAB — CANCER ANTIGEN 27.29: CA 27.29: 401.8 U/mL — ABNORMAL HIGH (ref 0.0–38.6)

## 2022-09-09 ENCOUNTER — Other Ambulatory Visit: Payer: Self-pay | Admitting: Rheumatology

## 2022-09-09 MED ORDER — AMPHETAMINE-DEXTROAMPHET ER 20 MG PO CP24: 20.0000 mg | ORAL_CAPSULE | Freq: Every day | ORAL | 0 refills | Status: DC

## 2022-09-09 NOTE — Telephone Encounter (Signed)
Patient called the office requesting a refill of Adderall to be sent to [facility] on [address].

## 2022-09-09 NOTE — Telephone Encounter (Signed)
Next Visit: 09/24/2022  Last Visit: 06/26/2022  Last Fill: 08/08/2022  Dx: Primary insomnia  Current Dose per office note on 06/26/2022: not discussed  Okay to refill Adderall?

## 2022-09-10 NOTE — Progress Notes (Unsigned)
Office Visit Note  Patient: Erica Wood             Date of Birth: [birth year]           MRN: [id number]             PCP: Eunice Blase, MD Referring: Eunice Blase, MD Visit Date: 09/24/2022 Occupation: _0 @  Subjective:  Pain in multiple joints   History of Present Illness: Erica Wood is a 56 y.o. female with history of systemic lupus erythematosus and discoid lupus.  Patient currently remains on plaquenil 200 mg 1 tablet by mouth daily.  She is tolerating Plaquenil without any side effects and has not missed any doses recently.  She denies any signs or symptoms of a systemic lupus flare but continues to have arthralgias and intermittent joint swelling.  She remains under the care of Dr. Lindi Adie for management of breast cancer.  At her last office visit they decided to de-escalate therapy and to change Ibrance 75 mg 3 weeks on 2 weeks off as well as herceptin every 6 weeks.  Patient is scheduled for a repeat chest CT abdomen and pelvis on 10/23/2022 for surveillance.  She has an upcoming appointment with Dr. Lindi Adie on 10/16/2022 at which time she would like to further discuss de-escalating therapy due to ongoing side effects.  She states that she was diagnosed with lymphedema and has been using a lymphedema pump at home as well as wearing compression stockings.  She continues to have ongoing pain and intermittent inflammation in both hands and both wrist joints.  She also has intermittent discomfort in both knee joints as well as intermittent swelling in the left knee.  She will be starting physical therapy to improve her lower back pain and bilateral knee pain starting tomorrow.  She plans on working on lower extremity muscle strengthening.  She continues to take tramadol for pain relief.     Activities of Daily Living:  Patient reports morning stiffness for a few minutes.   Patient Denies nocturnal pain.  Difficulty dressing/grooming: Denies Difficulty climbing  stairs: Denies Difficulty getting out of chair: Denies Difficulty using hands for taps, buttons, cutlery, and/or writing: Reports  Review of Systems  Constitutional:  Positive for fatigue.  HENT:  Negative for mouth sores and mouth dryness.   Eyes:  Negative for dryness.  Respiratory:  Negative for shortness of breath.   Cardiovascular:  Negative for chest pain and palpitations.  Gastrointestinal:  Positive for constipation. Negative for blood in stool and diarrhea.  Endocrine: Positive for cold intolerance. Negative for increased urination.  Genitourinary:  Negative for involuntary urination.  Musculoskeletal:  Positive for joint pain, joint pain, joint swelling, myalgias, muscle weakness, morning stiffness, muscle tenderness and myalgias. Negative for gait problem.  Skin:  Positive for color change. Negative for rash and sensitivity to sunlight.  Allergic/Immunologic: Negative for susceptible to infections.  Neurological:  Positive for dizziness and headaches.  Hematological:  Negative for swollen glands.  Psychiatric/Behavioral:  Negative for depressed mood and sleep disturbance. The patient is nervous/anxious.     PMFS History:  Patient Active Problem List   Diagnosis Date Noted   Goals of care, counseling/discussion 05/09/2021   Malignant neoplasm metastatic to liver ([place]) 01/03/2021   Port-A-Cath in place 11/02/2020   Aortic atherosclerosis ([place]) 09/21/2020   Malignant neoplasm of lower-outer quadrant of right breast of female, estrogen receptor positive ([place]) 08/10/2020   Abnormal cervical Papanicolaou smear 07/12/2020   Pulmonary embolism ([place]) 07/12/2020  Fibrocystic disease of breast 01/22/2018   Primary osteoarthritis of both knees 05/02/2017   Raynaud's disease without gangrene 11/26/2016   Discoid lupus 11/26/2016   Anticardiolipin antibody positive 11/26/2016   Autoimmune disease ([place]) 11/25/2016   High risk medication use 11/25/2016   Primary osteoarthritis of  both hands 11/25/2016   History of DVT (deep vein thrombosis) 11/25/2016   History of pulmonary embolism 11/25/2016   Primary insomnia 11/25/2016   Anxiety 11/25/2016   Irritable bowel syndrome with constipation 11/25/2016   Raynaud's phenomenon 01/03/2016   Varicose veins of lower extremities with other complications 50/38/8828   Spider veins of both lower extremities 05/24/2014    Past Medical History:  Diagnosis Date   Anti-cardiolipin antibody positive    Anti-cardiolipin antibody syndrome (HCC)    Anxiety    Arthralgia    Arthritis    bil knees, hands   Atypical chest pain    Autoimmune disease ([place])    Breast cancer metastasized to liver ([place]) 09/2020   Cancer ([place]) 07/2020   right breast IDC   Chronic fatigue    Depression    DVT (deep venous thrombosis) (HCC)    IBS (irritable bowel syndrome)    with constipation   Lupus ([place])    Lymphedema    bilateral legs per patient   Pulmonary embolus (HCC)    Raynaud's syndrome    Sicca ([place])    Thrombocytopenia ([place])     Family History  Problem Relation Age of Onset   Heart attack Mother    Rheum arthritis Father    Hemachromatosis Father    Heart Problems Father    Cancer Sister        bone    Colon cancer Neg Hx    Stomach cancer Neg Hx    Esophageal cancer Neg Hx    Pancreatic cancer Neg Hx    Liver disease Neg Hx    Past Surgical History:  Procedure Laterality Date   ABLATION  12/2018   leg veins   ABLATION Right 08/2019   venous leg    APPENDECTOMY     COLON SURGERY     Perforated colon repair after colostomy   EYE SURGERY Bilateral 2022   GANGLION CYST EXCISION     HYSTEROSCOPY WITH D & C N/A 01/13/2014   Procedure: DILATATION AND CURETTAGE /HYSTEROSCOPY with resection ;  Surgeon: Olga Millers, MD;  Location: [facility];  Service: Gynecology;  Laterality: N/A;   LAPAROTOMY     PORTACATH PLACEMENT N/A 09/13/2020   Procedure: INSERTION PORT-A-CATH WITH ULTRASOUND GUIDANCE;  Surgeon: Rolm Bookbinder, MD;  Location: [facility];  Service: General;  Laterality: N/A;   Social History   Social History Narrative   Not on file   Immunization History  Administered Date(s) Administered   PFIZER Comirnaty([place])Covid-19 Tri-Sucrose Vaccine 08/11/2020     Objective: Vital Signs: BP 128/79 (BP Location: Left Arm, Patient Position: Sitting, Cuff Size: Normal)   Pulse 69   Resp 14   Ht _0  (1.626 m)   Wt 111 lb (50.3 kg) Comment: per patient  BMI 19.05 kg/m    Physical Exam Vitals and nursing note reviewed.  Constitutional:      Appearance: She is well-developed.  HENT:     Head: Normocephalic and atraumatic.  Eyes:     Conjunctiva/sclera: Conjunctivae normal.  Cardiovascular:     Rate and Rhythm: Normal rate and regular rhythm.     Heart sounds: Normal heart sounds.  Pulmonary:     Effort: Pulmonary effort is normal.     Breath sounds: Normal breath sounds.  Abdominal:     General: Bowel sounds are normal.     Palpations: Abdomen is soft.  Musculoskeletal:     Cervical back: Normal range of motion.  Skin:    General: Skin is warm and dry.     Capillary Refill: Capillary refill takes less than 2 seconds.  Neurological:     Mental Status: She is alert and oriented to person, place, and time.  Psychiatric:        Behavior: Behavior normal.      Musculoskeletal Exam: C-spine has good range of motion with no discomfort.  Some tenderness over the right SI joint.  Shoulder joints, elbow joints, wrist joints, MCPs, PIPs, DIPs have good range of motion.  Some thickening over bilateral second and third MCP joints but no active synovitis on examination today.  She has tenderness over the DIP joints of the right second and third digits.  Complete fist formation bilaterally.  Hip joints have good range of motion with no groin pain.  Knee joints have good range of motion with some fullness in the left knee.  Ankle joints have good range of motion with no  tenderness or joint swelling.  No tenderness or synovitis over ankle joints.  CDAI Exam: CDAI Score: -- Patient Global: --; Provider Global: -- Swollen: --; Tender: -- Joint Exam 09/24/2022   No joint exam has been documented for this visit   There is currently no information documented on the homunculus. Go to the Rheumatology activity and complete the homunculus joint exam.  Investigation: No additional findings.  Imaging: ECHOCARDIOGRAM COMPLETE  Result Date: 09/11/2022    ECHOCARDIOGRAM REPORT   Patient Name:   KAMERAN LALLIER [employer] Date of Exam: 09/11/2022 Medical Rec #:  [id number]            Height:       64.0 in Accession #:    [id number]           Weight:       111.2 lb Date of Birth:  1966-08-01            BSA:          1.525 m Patient Age:    45 years             BP:           113/66 mmHg Patient Gender: F                    HR:           63 bpm. Exam Location:  Outpatient Procedure: 2D Echo, Cardiac Doppler, Color Doppler and Strain Analysis Indications:    Chemo Z09  History:        Patient has prior history of Echocardiogram examinations, most                 recent 06/07/2022.  Sonographer:    Bernadene Person RDCS Referring Phys: [id number] Nicholas Lose  Sonographer Comments: Global longitudinal strain was attempted. IMPRESSIONS  1. Left ventricular ejection fraction, by estimation, is 55 to 60%. Left ventricular ejection fraction by PLAX is 49 %. The left ventricle has normal function. The left ventricle demonstrates global hypokinesis. Left ventricular diastolic parameters were normal. The average left ventricular global longitudinal strain is -21.5 %. The global longitudinal strain is normal.  2. Right ventricular systolic function is normal. The right  ventricular size is normal. There is normal pulmonary artery systolic pressure.  3. Left atrial size was mildly dilated.  4. Right atrial size was moderately dilated.  5. The mitral valve is normal in structure. Trivial mitral valve  regurgitation. No evidence of mitral stenosis.  6. The aortic valve is tricuspid. Aortic valve regurgitation is mild. No aortic stenosis is present.  7. The inferior vena cava is normal in size with <50% respiratory variability, suggesting right atrial pressure of 8 mmHg. FINDINGS  Left Ventricle: Left ventricular ejection fraction, by estimation, is 55 to 60%. Left ventricular ejection fraction by PLAX is 49 %. The left ventricle has normal function. The left ventricle demonstrates global hypokinesis. The average left ventricular  global longitudinal strain is -21.5 %. The global longitudinal strain is normal. The left ventricular internal cavity size was normal in size. There is no left ventricular hypertrophy. Left ventricular diastolic parameters were normal. Normal left ventricular filling pressure. Right Ventricle: The right ventricular size is normal. No increase in right ventricular wall thickness. Right ventricular systolic function is normal. There is normal pulmonary artery systolic pressure. The tricuspid regurgitant velocity is 1.95 m/s, and  with an assumed right atrial pressure of 8 mmHg, the estimated right ventricular systolic pressure is 44.9 mmHg. Left Atrium: Left atrial size was mildly dilated. Right Atrium: Right atrial size was moderately dilated. Pericardium: There is no evidence of pericardial effusion. Mitral Valve: The mitral valve is normal in structure. Trivial mitral valve regurgitation. No evidence of mitral valve stenosis. Tricuspid Valve: The tricuspid valve is normal in structure. Tricuspid valve regurgitation is trivial. No evidence of tricuspid stenosis. Aortic Valve: The aortic valve is tricuspid. Aortic valve regurgitation is mild. Aortic regurgitation PHT measures 626 msec. No aortic stenosis is present. Pulmonic Valve: The pulmonic valve was normal in structure. Pulmonic valve regurgitation is mild. No evidence of pulmonic stenosis. Aorta: The aortic root is normal in size and  structure. Venous: The inferior vena cava is normal in size with less than 50% respiratory variability, suggesting right atrial pressure of 8 mmHg. IAS/Shunts: No atrial level shunt detected by color flow Doppler.  LEFT VENTRICLE PLAX 2D LV EF:         Left            Diastology                ventricular     LV e' medial:    10.30 cm/s                ejection        LV E/e' medial:  7.8                fraction by     LV e' lateral:   15.10 cm/s                PLAX is 49      LV E/e' lateral: 5.3                %. LVIDd:         4.80 cm         2D LVIDs:         3.60 cm         Longitudinal LV PW:         0.60 cm         Strain LV IVS:        0.60 cm  2D Strain GLS  -21.9 % LVOT diam:     1.90 cm         (A2C): LV SV:         73              2D Strain GLS  -22.5 % LV SV Index:   48              (A3C): LVOT Area:     2.84 cm        2D Strain GLS  -20.2 %                                (A4C):                                2D Strain GLS  -21.5 % LV Volumes (MOD)               Avg: LV vol d, MOD    110.0 ml A2C: LV vol d, MOD    100.0 ml A4C: LV vol s, MOD    41.3 ml A2C: LV vol s, MOD    42.1 ml A4C: LV SV MOD A2C:   68.7 ml LV SV MOD A4C:   100.0 ml LV SV MOD BP:    63.4 ml RIGHT VENTRICLE RV S prime:     12.10 cm/s TAPSE (M-mode): 2.6 cm LEFT ATRIUM             Index        RIGHT ATRIUM           Index LA diam:        3.10 cm 2.03 cm/m   RA Area:     17.80 cm LA Vol (A2C):   45.8 ml 30.04 ml/m  RA Volume:   54.30 ml  35.61 ml/m LA Vol (A4C):   33.0 ml 21.64 ml/m LA Biplane Vol: 39.4 ml 25.84 ml/m  AORTIC VALVE             PULMONIC VALVE LVOT Vmax:   118.00 cm/s PR End Diast Vel: 1.95 msec LVOT Vmean:  77.100 cm/s LVOT VTI:    0.257 m AI PHT:      626 msec  AORTA Ao Root diam: 3.20 cm Ao Asc diam:  3.20 cm MITRAL VALVE               TRICUSPID VALVE MV Area (PHT): 3.15 cm    TR Peak grad:   15.2 mmHg MV Decel Time: 241 msec    TR Vmax:        195.00 cm/s MV E velocity: 80.55 cm/s MV A velocity: 51.85  cm/s  SHUNTS MV E/A ratio:  1.55        Systemic VTI:  0.26 m                            Systemic Diam: 1.90 cm Skeet Latch MD Electronically signed by Skeet Latch MD Signature Date/Time: 09/11/2022/1:13:58 PM    Final     Recent Labs: Lab Results  Component Value Date   WBC 4.9 09/04/2022   HGB 12.9 09/04/2022   PLT 141 (L) 09/04/2022   NA 139 09/04/2022   K 4.1 09/04/2022   CL 105 09/04/2022   CO2 28 09/04/2022   GLUCOSE 93 09/04/2022  BUN 14 09/04/2022   CREATININE 0.97 09/04/2022   BILITOT 0.5 09/04/2022   ALKPHOS 76 09/04/2022   AST 26 09/04/2022   ALT 20 09/04/2022   PROT 6.7 09/04/2022   ALBUMIN 4.0 09/04/2022   CALCIUM 8.7 (L) 09/04/2022   GFRAA 95 07/28/2020   QFTBGOLDPLUS Negative 12/19/2021    Speciality Comments: PLQ Eye Exam: 04/02/2022 WNL @ [facility] follow up in 1 year. stage IB invasive ductal carcinoma, grade 3 08/16/20  Procedures:  No procedures performed Allergies: Beef-derived products, Latex, Sulfa antibiotics, and Tape   Assessment / Plan:     Visit Diagnoses: SLE (systemic lupus erythematosus related syndrome) (HCC) - Positive ANA and positive double-stranded DNA, hypocomplementemia, inflammatory arthritis, raynauds phenomenon and discoid lupus: She has not had any signs or symptoms of active systemic lupus.  Her disease remains well controlled taking Plaquenil 200 mg 1 tablet by mouth daily.  Lab work from 07/01/2022 did not reveal signs of active disease/flare: ANA negative, double-stranded DNA negative, complements within normal limits, and ESR within normal limits. She continues to have ongoing joint pain and intermittent inflammation involving multiple joints.  At her last OV on 06/26/22 Dr. Estanislado Pandy was in agreement that it is unlikely her arthritis is due to underlying lupus since her disease remains inactive.   We have discussed other immunosuppressive agents in the past in detail but she has been apprehensive to add any  medications.  Her chemotherapy regimen was recently de-escalated so she is hoping her symptoms may improve.  She will have updated autoimmune lab work in January.  Future orders were placed today.  She will follow up in the office in 4 months or sooner if needed.    Plan: Protein / creatinine ratio, urine, CBC with Differential/Platelet, COMPLETE METABOLIC PANEL WITH GFR, ANA, Anti-DNA antibody, double-stranded, C3 and C4, Sedimentation rate  Chronic inflammatory arthritis - RF negative: Patient continues to experience intermittent pain and stiffness in both hands, both wrist joints, and both knees.  She has noticed intermittent swelling in her hands and left knee.  She remains on Plaquenil 200 mg 1 tablet by mouth daily.  She is taking tramadol 50 mg 1 to 2 tablets every 6 hours as needed for pain relief.  She will be starting physical therapy to try to alleviate her lower back and bilateral knee pain starting tomorrow.   Dr. Estanislado Pandy and I both have felt that her inflammatory arthritis is not autoimmune driven.  The patient remains apprehensive to add on low-dose methotrexate or Enbrel at this time. It remains likely that her inflammation is due to her current chemotherapy regimen.  She does not want to add any medications at this time.  She was advised to notify us if she develops any new or worsening symptoms.  - Plan: Sedimentation rate  High risk medication use - Plaquenil 200 mg 1 tablet by mouth daily.   CBC and CMP updated on 09/04/22.  PLQ Eye Exam: 04/02/2022 WNL @ [facility] follow up in 1 year. - Plan: CBC with Differential/Platelet, COMPLETE METABOLIC PANEL WITH GFR  Discoid lupus - No recurrence.   Anticardiolipin antibody positive - Anticardiolipin antibodies were initially positive but were negative with repeat testing.  Patient remains on warfarin.  Malignant neoplasm of lower-outer quadrant of right breast of female, estrogen receptor positive ([place]) -She was diagnosed  with triple positive stage IV breast cancer.  Previous patient of Dr. Jana Hakim and has established care with Dr. Lindi Adie.  Reviewed office visit note from 07/24/2022.  De-escalation of therapy was discussed including changing Ibrance 75 mg 3 weeks on and 3 weeks off as well as Herceptin to every 6 weeks instead of every 4 weeks.  Patient had a repeat echocardiogram on 09/11/2022. She is scheduled for a repeat CT chest abdomen and pelvis on 10/23/2022.  Raynaud's disease without gangrene: She continues to have intermittent symptoms of Raynaud's phenomenon.  No digital ulcerations or signs of gangrene were noted.  No signs of sclerodactyly noted on examination today.  Primary osteoarthritis of both hands: On examination she has PIP and DIP thickening.  Some tenderness and inflammation in the DIPs of the right 2nd and 3rd digits.   Primary osteoarthritis of both knees - S/p visco in the past.  MRI of the right knee on 11/14/2021.  Patient will be initiating physical therapy starting tomorrow.  She is taking tramadol 50 mg 1 to 2 tablets every 6 hours for pain relief.  Primary insomnia  Other osteoporosis without current pathological fracture - DEXA updated on 05/10/2021: AP spine BMD 0.761 with T score -2.6.  She has been taking vitamin D 3000 units daily.  Patient is hesitant to add any medications at this time.  Other medical conditions are listed as follows:  History of pulmonary embolism: She remains on warfarin as prescribed.  History of DVT (deep vein thrombosis): She remains on warfarin as prescribed.  History of IBS  History of anxiety  Vitamin D deficiency  Port-A-Cath in place  Family history of hemochromatosis  Orders: Orders Placed This Encounter  Procedures   Protein / creatinine ratio, urine   CBC with Differential/Platelet   COMPLETE METABOLIC PANEL WITH GFR   ANA   Anti-DNA antibody, double-stranded   C3 and C4   Sedimentation rate   No orders of the defined types were  placed in this encounter.    Follow-Up Instructions: Return in about 4 months (around 01/23/2023) for Systemic lupus erythematosus.   Ofilia Neas, PA-C  Note - This record has been created using Dragon software.  Chart creation errors have been sought, but may not always  have been located. Such creation errors do not reflect on  the standard of medical care.

## 2022-09-11 ENCOUNTER — Ambulatory Visit (HOSPITAL_COMMUNITY)
Admission: RE | Admit: 2022-09-11 | Discharge: 2022-09-11 | Disposition: A | Discharge status: Home / Self Care | Payer: 59 | Source: Ambulatory Visit | Attending: Hematology and Oncology | Admitting: Hematology and Oncology

## 2022-09-11 DIAGNOSIS — I351 Nonrheumatic aortic (valve) insufficiency: Secondary | ICD-10-CM | POA: Diagnosis not present

## 2022-09-11 DIAGNOSIS — Z5181 Encounter for therapeutic drug level monitoring: Secondary | ICD-10-CM | POA: Diagnosis not present

## 2022-09-11 DIAGNOSIS — Z79899 Other long term (current) drug therapy: Secondary | ICD-10-CM | POA: Diagnosis not present

## 2022-09-11 DIAGNOSIS — Z0189 Encounter for other specified special examinations: Secondary | ICD-10-CM | POA: Diagnosis not present

## 2022-09-11 LAB — ECHOCARDIOGRAM COMPLETE
Area-P 1/2: 3.15 cm2
Calc EF: 60.2 %
P 1/2 time: 626 msec
S' Lateral: 3.6 cm
Single Plane A2C EF: 62.5 %
Single Plane A4C EF: 57.9 %

## 2022-09-11 NOTE — Progress Notes (Signed)
  Echocardiogram 2D Echocardiogram has been performed.  Erica Wood 09/11/2022, 12:08 PM

## 2022-09-24 ENCOUNTER — Ambulatory Visit: Payer: 59 | Attending: Physician Assistant | Admitting: Physician Assistant

## 2022-09-24 ENCOUNTER — Encounter: Payer: Self-pay | Admitting: Physician Assistant

## 2022-09-24 VITALS — BP 128/79 | HR 69 | Resp 14 | Ht 64.0 in | Wt 111.0 lb

## 2022-09-24 DIAGNOSIS — F5101 Primary insomnia: Secondary | ICD-10-CM

## 2022-09-24 DIAGNOSIS — Z8659 Personal history of other mental and behavioral disorders: Secondary | ICD-10-CM

## 2022-09-24 DIAGNOSIS — Z170 Estrogen receptor positive status [ER+]: Secondary | ICD-10-CM

## 2022-09-24 DIAGNOSIS — Z86711 Personal history of pulmonary embolism: Secondary | ICD-10-CM

## 2022-09-24 DIAGNOSIS — C50511 Malignant neoplasm of lower-outer quadrant of right female breast: Secondary | ICD-10-CM

## 2022-09-24 DIAGNOSIS — M19041 Primary osteoarthritis, right hand: Secondary | ICD-10-CM

## 2022-09-24 DIAGNOSIS — M818 Other osteoporosis without current pathological fracture: Secondary | ICD-10-CM

## 2022-09-24 DIAGNOSIS — M1990 Unspecified osteoarthritis, unspecified site: Secondary | ICD-10-CM | POA: Diagnosis not present

## 2022-09-24 DIAGNOSIS — R760 Raised antibody titer: Secondary | ICD-10-CM

## 2022-09-24 DIAGNOSIS — L930 Discoid lupus erythematosus: Secondary | ICD-10-CM

## 2022-09-24 DIAGNOSIS — Z79899 Other long term (current) drug therapy: Secondary | ICD-10-CM | POA: Diagnosis not present

## 2022-09-24 DIAGNOSIS — E559 Vitamin D deficiency, unspecified: Secondary | ICD-10-CM

## 2022-09-24 DIAGNOSIS — M329 Systemic lupus erythematosus, unspecified: Secondary | ICD-10-CM

## 2022-09-24 DIAGNOSIS — Z86718 Personal history of other venous thrombosis and embolism: Secondary | ICD-10-CM

## 2022-09-24 DIAGNOSIS — M19042 Primary osteoarthritis, left hand: Secondary | ICD-10-CM

## 2022-09-24 DIAGNOSIS — I7300 Raynaud's syndrome without gangrene: Secondary | ICD-10-CM

## 2022-09-24 DIAGNOSIS — Z8719 Personal history of other diseases of the digestive system: Secondary | ICD-10-CM

## 2022-09-24 DIAGNOSIS — Z95828 Presence of other vascular implants and grafts: Secondary | ICD-10-CM

## 2022-09-24 DIAGNOSIS — M170 Bilateral primary osteoarthritis of knee: Secondary | ICD-10-CM

## 2022-09-24 DIAGNOSIS — Z8349 Family history of other endocrine, nutritional and metabolic diseases: Secondary | ICD-10-CM

## 2022-09-25 ENCOUNTER — Other Ambulatory Visit: Payer: Self-pay

## 2022-10-09 ENCOUNTER — Other Ambulatory Visit: Payer: Self-pay | Admitting: Hematology and Oncology

## 2022-10-09 DIAGNOSIS — C50511 Malignant neoplasm of lower-outer quadrant of right female breast: Secondary | ICD-10-CM

## 2022-10-10 ENCOUNTER — Other Ambulatory Visit: Payer: Self-pay

## 2022-10-10 NOTE — Telephone Encounter (Signed)
Dr. Lindi Adie are you able to edit her script since  I can not.  Your last note states - Ibrance 75 mg 3 weeks on 2 weeks off

## 2022-10-11 ENCOUNTER — Other Ambulatory Visit: Payer: Self-pay | Admitting: Rheumatology

## 2022-10-11 MED ORDER — AMPHETAMINE-DEXTROAMPHET ER 20 MG PO CP24: 20.0000 mg | ORAL_CAPSULE | Freq: Every day | ORAL | 0 refills | Status: DC

## 2022-10-11 NOTE — Telephone Encounter (Signed)
Next Visit: 01/30/2023  Last Visit: 09/24/2022  Last Fill: 09/09/2022  Current Dose per office note on 09/24/2022: not discussed  Okay to refill Adderall?

## 2022-10-11 NOTE — Telephone Encounter (Signed)
Patient called requesting prescription refill of Adderall to be sent to [facility] at [place].

## 2022-10-12 NOTE — Progress Notes (Signed)
Patient Care Team: Eunice Blase, MD as PCP - General (Family Medicine) Prudence Davidson, [place] Bing, MD as Referring Physician (Oncology) Rockwell Germany, RN as Oncology Nurse Navigator Mauro Kaufmann, RN as Oncology Nurse Navigator Rolm Bookbinder, MD as Consulting Physician (General Surgery) Kyung Rudd, MD as Consulting Physician (Radiation Oncology) Bo Merino, MD as Consulting Physician (Rheumatology) Olga Millers, MD as Consulting Physician (Obstetrics and Gynecology) Larey Dresser, MD as Consulting Physician (Cardiology) Armbruster, Carlota Raspberry, MD as Consulting Physician (Gastroenterology) Nena Jordan, MD as Referring Physician (Ophthalmology) Nena Jordan, MD as Referring Physician (Ophthalmology) Nicholas Lose, MD as Consulting Physician (Hematology and Oncology)  DIAGNOSIS: No diagnosis found.  SUMMARY OF ONCOLOGIC HISTORY: Oncology History  Malignant neoplasm of lower-outer quadrant of right breast of female, estrogen receptor positive ([place])  08/04/2020 Initial Diagnosis   Right breast lower outer quadrant biopsy 08/04/2020 for a clinical T2 N0 M1, stage IV invasive ductal carcinoma, grade 3, triple positive, with an MIB-1 of 40%. abdominal MRI 08/24/2020 shows multiple liver lesions highly suspicious for liver metastases. Liver biopsy 09/20/2020 positive for carcinoma, prognostic panel estrogen and progesterone receptor positive, but HER-2 negative (0); MIB-1 of 30%   08/31/2020 - 12/14/2020 Chemotherapy   Neoadjuvant chemotherapy consisting of docetaxel, carboplatin, trastuzumab and pertuzumab every 21 days x 6    12/26/2020 Imaging   breast MRI 12/26/2020 showed right breast index lesion decreased to 0.8 cm; 2 additional masses in the right breast stable abdominal MRI 01/29/2021 shows no active disease in the liver    Chemotherapy   Herceptin Maintenance initially q 3 weeks, now q 4 weeks   01/29/2021 -  Anti-estrogen oral therapy   anastrozole and  palbociclib    Malignant neoplasm metastatic to liver ([place])  01/03/2021 Initial Diagnosis   Liver metastases ([place])   01/03/2021 -  Chemotherapy   Patient is on Treatment Plan : BREAST Trastuzumab q21d       CHIEF COMPLIANT:   INTERVAL HISTORY: Erica Wood is a   ALLERGIES:  is allergic to beef-derived products, latex, sulfa antibiotics, and tape.  MEDICATIONS:  Current Outpatient Medications  Medication Sig Dispense Refill   acetaminophen (TYLENOL) 500 MG tablet Take 1,000 mg by mouth every 6 (six) hours as needed for moderate pain or headache.     amphetamine-dextroamphetamine (ADDERALL XR) 20 MG 24 hr capsule Take 1 capsule (20 mg total) by mouth daily. 30 capsule 0   Ascorbic Acid (VITAMIN C PO) Take 3,000 mg by mouth daily.     Cholecalciferol (VITAMIN D3) 75 MCG (3000 UT) TABS Take 3,000 Units by mouth daily.     Clindamycin-Benzoyl Per, Refr, gel Apply topically.     estradiol (ESTRACE VAGINAL) 0.1 MG/GM vaginal cream Place 1 Applicatorful vaginally 3 (three) times a week. 42.5 g 12   famotidine (PEPCID) 20 MG tablet Take 20 mg by mouth at bedtime.     hydroxychloroquine (PLAQUENIL) 200 MG tablet Take 1 tablet (200 mg total) by mouth daily. 90 tablet 4   IBRANCE 75 MG tablet TAKE 1 TABLET BY MOUTH ONCE  DAILY WITH OR WITHOUT FOOD FOR  21 DAYS, FOLLOWED BY 7 DAYS OFF, REPEATED EVERY 28 DAYS 21 tablet 6   letrozole (FEMARA) 2.5 MG tablet Take 1 tablet (2.5 mg total) by mouth daily. 90 tablet 3   LORazepam (ATIVAN) 0.5 MG tablet Take 1 tablet before your upcoming MRI for claustrophobia (Patient not taking: Reported on 06/26/2022) 2 tablet 0   metoCLOPramide (REGLAN) 10 MG tablet  Take 5-10 mg by mouth as needed.     predniSONE (DELTASONE) 5 MG tablet Take 1 tablet (5 mg total) by mouth daily with breakfast. (Patient not taking: Reported on 09/24/2022) 30 tablet 0   spironolactone (ALDACTONE) 25 MG tablet Take 25 mg by mouth daily as needed (swelling).     traMADol (ULTRAM)  50 MG tablet Take 1-2 tablets (50-100 mg total) by mouth every 6 (six) hours as needed. 60 tablet 0   tretinoin (RETIN-A) 0.1 % cream Apply 1 application topically at bedtime as needed (acne).      triamterene-hydrochlorothiazide (DYAZIDE) 37.5-25 MG capsule TAKE ONE CAPSULE BY MOUTH DAILY 90 capsule 1   warfarin (COUMADIN) 5 MG tablet TAKE 1 TABLET(5 MG) BY MOUTH DAILY 30 tablet PRN   No current facility-administered medications for this visit.    PHYSICAL EXAMINATION: ECOG PERFORMANCE STATUS: {CHL ONC ECOG PS:[id number]}  There were no vitals filed for this visit. There were no vitals filed for this visit.  BREAST:*** No palpable masses or nodules in either right or left breasts. No palpable axillary supraclavicular or infraclavicular adenopathy no breast tenderness or nipple discharge. (exam performed in the presence of a chaperone)  LABORATORY DATA:  I have reviewed the data as listed    Latest Ref Rng & Units 09/04/2022    1:39 PM 07/24/2022    1:57 PM 06/27/2022   12:47 PM  CMP  Glucose 70 - 99 mg/dL 93  91  92   BUN 6 - 20 mg/dL _0 Creatinine 0.44 - 1.00 mg/dL 0.97  0.90  0.93   Sodium 135 - 145 mmol/L 139  139  136   Potassium 3.5 - 5.1 mmol/L 4.1  4.0  3.9   Chloride 98 - 111 mmol/L 105  105  104   CO2 22 - 32 mmol/L _1 Calcium 8.9 - 10.3 mg/dL 8.7  9.1  9.0   Total Protein 6.5 - 8.1 g/dL 6.7  6.5  6.5   Total Bilirubin 0.3 - 1.2 mg/dL 0.5  0.6  0.5   Alkaline Phos 38 - 126 U/L 76  75  92   AST 15 - 41 U/L _2 ALT 0 - 44 U/L _3 Lab Results  Component Value Date   WBC 4.9 09/04/2022   HGB 12.9 09/04/2022   HCT 37.1 09/04/2022   MCV 98.7 09/04/2022   PLT 141 (L) 09/04/2022   NEUTROABS 3.2 09/04/2022    ASSESSMENT & PLAN:  No problem-specific Assessment & Plan notes found for this encounter.    No orders of the defined types were placed in this encounter.  The patient has a good understanding of the overall plan. she  agrees with it. she will call with any problems that may develop before the next visit here. Total time spent: 30 mins including face to face time and time spent for planning, charting and co-ordination of care   Suzzette Righter, [place] 10/12/22    I Gardiner Coins am scribing for Dr. Lindi Adie  ***

## 2022-10-16 ENCOUNTER — Other Ambulatory Visit: Payer: Self-pay

## 2022-10-16 ENCOUNTER — Inpatient Hospital Stay: Payer: 59

## 2022-10-16 ENCOUNTER — Telehealth: Payer: Self-pay | Admitting: Hematology and Oncology

## 2022-10-16 ENCOUNTER — Inpatient Hospital Stay: Payer: 59 | Attending: Oncology | Admitting: Hematology and Oncology

## 2022-10-16 ENCOUNTER — Other Ambulatory Visit: Payer: Self-pay | Admitting: *Deleted

## 2022-10-16 VITALS — BP 114/57 | HR 64 | Temp 97.9°F | Resp 18 | Ht 64.0 in | Wt 112.7 lb

## 2022-10-16 DIAGNOSIS — Z7901 Long term (current) use of anticoagulants: Secondary | ICD-10-CM | POA: Diagnosis not present

## 2022-10-16 DIAGNOSIS — Z86718 Personal history of other venous thrombosis and embolism: Secondary | ICD-10-CM

## 2022-10-16 DIAGNOSIS — C787 Secondary malignant neoplasm of liver and intrahepatic bile duct: Secondary | ICD-10-CM | POA: Diagnosis present

## 2022-10-16 DIAGNOSIS — Z170 Estrogen receptor positive status [ER+]: Secondary | ICD-10-CM | POA: Diagnosis not present

## 2022-10-16 DIAGNOSIS — Z95828 Presence of other vascular implants and grafts: Secondary | ICD-10-CM

## 2022-10-16 DIAGNOSIS — Z5112 Encounter for antineoplastic immunotherapy: Secondary | ICD-10-CM | POA: Diagnosis present

## 2022-10-16 DIAGNOSIS — C50511 Malignant neoplasm of lower-outer quadrant of right female breast: Secondary | ICD-10-CM | POA: Diagnosis present

## 2022-10-16 LAB — CBC WITH DIFFERENTIAL (CANCER CENTER ONLY)
Abs Immature Granulocytes: 0.01 10*3/uL (ref 0.00–0.07)
Basophils Absolute: 0.1 10*3/uL (ref 0.0–0.1)
Basophils Relative: 3 %
Eosinophils Absolute: 0 10*3/uL (ref 0.0–0.5)
Eosinophils Relative: 1 %
HCT: 35.4 % — ABNORMAL LOW (ref 36.0–46.0)
Hemoglobin: 12.1 g/dL (ref 12.0–15.0)
Immature Granulocytes: 0 %
Lymphocytes Relative: 39 %
Lymphs Abs: 1.2 10*3/uL (ref 0.7–4.0)
MCH: 34.1 pg — ABNORMAL HIGH (ref 26.0–34.0)
MCHC: 34.2 g/dL (ref 30.0–36.0)
MCV: 99.7 fL (ref 80.0–100.0)
Monocytes Absolute: 0.3 10*3/uL (ref 0.1–1.0)
Monocytes Relative: 9 %
Neutro Abs: 1.5 10*3/uL — ABNORMAL LOW (ref 1.7–7.7)
Neutrophils Relative %: 48 %
Platelet Count: 148 10*3/uL — ABNORMAL LOW (ref 150–400)
RBC: 3.55 MIL/uL — ABNORMAL LOW (ref 3.87–5.11)
RDW: 12.4 % (ref 11.5–15.5)
WBC Count: 3 10*3/uL — ABNORMAL LOW (ref 4.0–10.5)
nRBC: 0 % (ref 0.0–0.2)

## 2022-10-16 LAB — CMP (CANCER CENTER ONLY)
ALT: 15 U/L (ref 0–44)
AST: 22 U/L (ref 15–41)
Albumin: 4 g/dL (ref 3.5–5.0)
Alkaline Phosphatase: 87 U/L (ref 38–126)
Anion gap: 3 — ABNORMAL LOW (ref 5–15)
BUN: 14 mg/dL (ref 6–20)
CO2: 29 mmol/L (ref 22–32)
Calcium: 9.1 mg/dL (ref 8.9–10.3)
Chloride: 104 mmol/L (ref 98–111)
Creatinine: 0.87 mg/dL (ref 0.44–1.00)
GFR, Estimated: >60 mL/min (ref >60)
Glucose, Bld: 89 mg/dL (ref 70–99)
Potassium: 3.9 mmol/L (ref 3.5–5.1)
Sodium: 136 mmol/L (ref 135–145)
Total Bilirubin: 0.3 mg/dL (ref 0.3–1.2)
Total Protein: 6.3 g/dL — ABNORMAL LOW (ref 6.5–8.1)

## 2022-10-16 MED ORDER — SODIUM CHLORIDE 0.9 % IV SOLN: Freq: Once | INTRAVENOUS | Status: AC

## 2022-10-16 MED ORDER — TRASTUZUMAB-ANNS CHEMO 150 MG IV SOLR
8.0000 mg/kg | Freq: Once | INTRAVENOUS | Status: AC
  Administered 2022-10-16: 420 mg via INTRAVENOUS
  Filled 2022-10-16: qty 20

## 2022-10-16 MED ORDER — ACETAMINOPHEN 325 MG PO TABS
650.0000 mg | ORAL_TABLET | Freq: Once | ORAL | Status: AC
  Administered 2022-10-16: 650 mg via ORAL
  Filled 2022-10-16: qty 2

## 2022-10-16 MED ORDER — DIPHENHYDRAMINE HCL 25 MG PO CAPS
25.0000 mg | ORAL_CAPSULE | Freq: Once | ORAL | Status: AC
  Administered 2022-10-16: 25 mg via ORAL
  Filled 2022-10-16: qty 1

## 2022-10-16 MED ORDER — METOCLOPRAMIDE HCL 10 MG PO TABS: 10.0000 mg | ORAL_TABLET | ORAL | 3 refills | Status: DC | PRN

## 2022-10-16 MED ORDER — SODIUM CHLORIDE 0.9% FLUSH
10.0000 mL | Freq: Once | INTRAVENOUS | Status: AC
  Administered 2022-10-16: 10 mL

## 2022-10-16 NOTE — Patient Instructions (Signed)
[facility]   Discharge Instructions: Thank you for choosing [facility] to provide your oncology and hematology care.   If you have a lab appointment with the [facility], please go directly to the [facility] and check in at the registration area.   Wear comfortable clothing and clothing appropriate for easy access to any Portacath or PICC line.   We strive to give you quality time with your provider. You may need to reschedule your appointment if you arrive late (15 or more minutes).  Arriving late affects you and other patients whose appointments are after yours.  Also, if you miss three or more appointments without notifying the office, you may be dismissed from the clinic at the provider's discretion.      For prescription refill requests, have your pharmacy contact our office and allow 72 hours for refills to be completed.    Today you received the following chemotherapy and/or immunotherapy agents: trastuzumab-anns      To help prevent nausea and vomiting after your treatment, we encourage you to take your nausea medication as directed.  BELOW ARE SYMPTOMS THAT SHOULD BE REPORTED IMMEDIATELY: *FEVER GREATER THAN 100.4 F (38 C) OR HIGHER *CHILLS OR SWEATING *NAUSEA AND VOMITING THAT IS NOT CONTROLLED WITH YOUR NAUSEA MEDICATION *UNUSUAL SHORTNESS OF BREATH *UNUSUAL BRUISING OR BLEEDING *URINARY PROBLEMS (pain or burning when urinating, or frequent urination) *BOWEL PROBLEMS (unusual diarrhea, constipation, pain near the anus) TENDERNESS IN MOUTH AND THROAT WITH OR WITHOUT PRESENCE OF ULCERS (sore throat, sores in mouth, or a toothache) UNUSUAL RASH, SWELLING OR PAIN  UNUSUAL VAGINAL DISCHARGE OR ITCHING   Items with * indicate a potential emergency and should be followed up as soon as possible or go to the Emergency Department if any problems should occur.  Please show the CHEMOTHERAPY ALERT CARD or IMMUNOTHERAPY ALERT CARD at  check-in to the Emergency Department and triage nurse.  Should you have questions after your visit or need to cancel or reschedule your appointment, please contact [facility]  Dept: [phone]  and follow the prompts.  Office hours are 8:00 a.m. to 4:30 p.m. Monday - Friday. Please note that voicemails left after 4:00 p.m. may not be returned until the following business day.  We are closed weekends and major holidays. You have access to a nurse at all times for urgent questions. Please call the main number to the clinic Dept: [phone] and follow the prompts.   For any non-urgent questions, you may also contact your provider using MyChart. We now offer e-Visits for anyone 50 and older to request care online for non-urgent symptoms. For details visit [email or web address].   Also download the MyChart app! Go to the app store, search "MyChart", open the app, select [facility], and log in with your MyChart username and password.  Masks are optional in the cancer centers. If you would like for your care team to wear a mask while they are taking care of you, please let them know. You may have one support person who is at least 54 years old accompany you for your appointments.

## 2022-10-16 NOTE — Assessment & Plan Note (Signed)
08/04/2020: Right breast biopsy T2 N0 M1 stage IV IDC grade 3, triple positive with a Ki-67 of 40%, multiple liver lesions biopsy was positive for breast cancer ER/PR positive HER2 negative with a Ki-67 30% 08/31/2020-12/14/2020: TCHP x6   Current treatment: Herceptin maintenance every 4 weeks, letrozole and palbociclib (started 01/29/2021) Treatment toxicities: Severe constipation: She had constipation problems even prior to starting treatments.  She is using Dulcolax as needed Gastritis: On Pepcid Neutropenia: Secondary to Ibrance Severe fatigue: We will reduce the dosage of Ibrance to 75 mg   COVID infection January 2023: Held Ibrance and Herceptin treatments.  Resumed 02/06/2022   01/17/2022: CT CAP: Very subtle metastatic lesion of the caudate lobe is unchanged.  No new metastatic disease elsewhere.  07/06/22: CT CAP: Stable very subtle metastatic lesion in the caudate lobe of the liver. No new metastatic disease elsewhere.  06/13/2022: Brain MRI: Negative   Discussion: We discussed different options for de-escalation of therapy and we decided to change Ibrance 75 mg 3 weeks on 2 weeks off as well as Herceptin to every 6 weeks.   We will reassess her with CT scans in 3 months and follow-up after that to discuss results Scans have been scheduled for 10/23/2022 

## 2022-10-16 NOTE — Telephone Encounter (Signed)
Scheduled appointment per 12/6 los. Left voicemail.

## 2022-10-17 LAB — CANCER ANTIGEN 27.29: CA 27.29: 383.5 U/mL — ABNORMAL HIGH (ref 0.0–38.6)

## 2022-10-17 NOTE — Progress Notes (Signed)
HEMATOLOGY-ONCOLOGY TELEPHONE VISIT PROGRESS NOTE  I connected with our patient on 10/18/22 at 11:15 AM EST by telephone and verified that I am speaking with the correct person using two identifiers.  I discussed the limitations, risks, security and privacy concerns of performing an evaluation and management service by telephone and the availability of in person appointments.  I also discussed with the patient that there may be a patient responsible charge related to this service. The patient expressed understanding and agreed to proceed.   History of Present Illness: Telephone visit to discuss the result of CA 27-29  Oncology History  Malignant neoplasm of lower-outer quadrant of right breast of female, estrogen receptor positive ([place])  08/04/2020 Initial Diagnosis   Right breast lower outer quadrant biopsy 08/04/2020 for a clinical T2 N0 M1, stage IV invasive ductal carcinoma, grade 3, triple positive, with an MIB-1 of 40%. abdominal MRI 08/24/2020 shows multiple liver lesions highly suspicious for liver metastases. Liver biopsy 09/20/2020 positive for carcinoma, prognostic panel estrogen and progesterone receptor positive, but HER-2 negative (0); MIB-1 of 30%   08/31/2020 - 12/14/2020 Chemotherapy   Neoadjuvant chemotherapy consisting of docetaxel, carboplatin, trastuzumab and pertuzumab every 21 days x 6    12/26/2020 Imaging   breast MRI 12/26/2020 showed right breast index lesion decreased to 0.8 cm; 2 additional masses in the right breast stable abdominal MRI 01/29/2021 shows no active disease in the liver    Chemotherapy   Herceptin Maintenance initially q 3 weeks, now q 4 weeks   01/29/2021 -  Anti-estrogen oral therapy   anastrozole and palbociclib    Malignant neoplasm metastatic to liver ([place])  01/03/2021 Initial Diagnosis   Liver metastases ([place])   01/03/2021 -  Chemotherapy   Patient is on Treatment Plan : BREAST Trastuzumab q21d       REVIEW OF SYSTEMS:   Constitutional:  Denies fevers, chills or abnormal weight loss All other systems were reviewed with the patient and are negative. Observations/Objective:     Assessment Plan:  Malignant neoplasm of lower-outer quadrant of right breast of female, estrogen receptor positive ([place]) 08/04/2020: Right breast biopsy T2 N0 M1 stage IV IDC grade 3, triple positive with a Ki-67 of 40%, multiple liver lesions biopsy was positive for breast cancer ER/PR positive HER2 negative with a Ki-67 30% 08/31/2020-12/14/2020: TCHP x6   Current treatment: Herceptin maintenance every 4 weeks, letrozole and palbociclib (started 01/29/2021) Treatment toxicities: Severe constipation: She had constipation problems even prior to starting treatments.  She is using Dulcolax as needed Gastritis: I sent a prescription for Pepcid Neutropenia: Secondary to Ibrance Severe fatigue: I reduce the dosage of Ibrance to 75 mg today   COVID infection January 2023: Held Leslee Home and Herceptin treatments.  Resumed 02/06/2022   01/17/2022: CT CAP: Very subtle metastatic lesion of the caudate lobe is unchanged.  No new metastatic disease elsewhere.  07/06/22: CT CAP: Stable very subtle metastatic lesion in the caudate lobe of the liver. No new metastatic disease elsewhere.  06/13/2022: Brain MRI: Negative  Treatment plan: We will discontinue Herceptin and continue her on maintenance Ibrance. She has a CT scan coming up next week and I will discuss results with her 2 days after the scan. I will send a consult to cardiology regarding her echocardiogram findings.  Her CA 27-29 is trending downwards.     I discussed the assessment and treatment plan with the patient. The patient was provided an opportunity to ask questions and all were answered. The patient agreed with the  plan and demonstrated an understanding of the instructions. The patient was advised to call back or seek an in-person evaluation if the symptoms worsen or if the condition fails to improve as  anticipated.   I provided 12 minutes of non-face-to-face time during this encounter.  This includes time for charting and coordination of care   Harriette Ohara, MD  I Gardiner Coins am scribing for Dr. Lindi Adie  I have reviewed the above documentation for accuracy and completeness, and I agree with the above.

## 2022-10-18 ENCOUNTER — Inpatient Hospital Stay (HOSPITAL_BASED_OUTPATIENT_CLINIC_OR_DEPARTMENT_OTHER): Payer: 59 | Admitting: Hematology and Oncology

## 2022-10-18 DIAGNOSIS — C50511 Malignant neoplasm of lower-outer quadrant of right female breast: Secondary | ICD-10-CM | POA: Diagnosis not present

## 2022-10-18 DIAGNOSIS — Z170 Estrogen receptor positive status [ER+]: Secondary | ICD-10-CM | POA: Diagnosis not present

## 2022-10-18 NOTE — Assessment & Plan Note (Signed)
08/04/2020: Right breast biopsy T2 N0 M1 stage IV IDC grade 3, triple positive with a Ki-67 of 40%, multiple liver lesions biopsy was positive for breast cancer ER/PR positive HER2 negative with a Ki-67 30% 08/31/2020-12/14/2020: TCHP x6   Current treatment: Herceptin maintenance every 4 weeks, letrozole and palbociclib (started 01/29/2021) Treatment toxicities: Severe constipation: She had constipation problems even prior to starting treatments.  She is using Dulcolax as needed Gastritis: I sent a prescription for Pepcid Neutropenia: Secondary to Ibrance Severe fatigue: I reduce the dosage of Ibrance to 75 mg today   COVID infection January 2023: Held Leslee Home and Herceptin treatments.  Resumed 02/06/2022   01/17/2022: CT CAP: Very subtle metastatic lesion of the caudate lobe is unchanged.  No new metastatic disease elsewhere.  07/06/22: CT CAP: Stable very subtle metastatic lesion in the caudate lobe of the liver. No new metastatic disease elsewhere.  06/13/2022: Brain MRI: Negative  Treatment plan: We will discontinue Herceptin and continue her on maintenance Ibrance. She has a CT scan coming up next week and I will discuss results with her 2 days after the scan. Her CA 27-29 is trending downwards.

## 2022-10-23 ENCOUNTER — Ambulatory Visit (HOSPITAL_COMMUNITY)
Admission: RE | Admit: 2022-10-23 | Discharge: 2022-10-23 | Disposition: A | Discharge status: Home / Self Care | Payer: 59 | Source: Ambulatory Visit | Attending: Hematology and Oncology | Admitting: Hematology and Oncology

## 2022-10-23 DIAGNOSIS — C787 Secondary malignant neoplasm of liver and intrahepatic bile duct: Secondary | ICD-10-CM | POA: Insufficient documentation

## 2022-10-23 DIAGNOSIS — Z170 Estrogen receptor positive status [ER+]: Secondary | ICD-10-CM | POA: Insufficient documentation

## 2022-10-23 DIAGNOSIS — C50511 Malignant neoplasm of lower-outer quadrant of right female breast: Secondary | ICD-10-CM | POA: Insufficient documentation

## 2022-10-23 MED ORDER — IOHEXOL 300 MG/ML  SOLN
75.0000 mL | Freq: Once | INTRAMUSCULAR | Status: AC | PRN
  Administered 2022-10-23: 75 mL via INTRAVENOUS

## 2022-10-29 ENCOUNTER — Inpatient Hospital Stay: Payer: 59

## 2022-10-29 ENCOUNTER — Other Ambulatory Visit: Payer: Self-pay

## 2022-10-29 ENCOUNTER — Telehealth: Payer: Self-pay | Admitting: Hematology and Oncology

## 2022-10-29 ENCOUNTER — Inpatient Hospital Stay (HOSPITAL_BASED_OUTPATIENT_CLINIC_OR_DEPARTMENT_OTHER): Payer: 59 | Admitting: Hematology and Oncology

## 2022-10-29 VITALS — BP 130/48 | HR 70 | Temp 97.2°F | Resp 18 | Ht 64.0 in | Wt 116.2 lb

## 2022-10-29 DIAGNOSIS — Z170 Estrogen receptor positive status [ER+]: Secondary | ICD-10-CM

## 2022-10-29 DIAGNOSIS — C50511 Malignant neoplasm of lower-outer quadrant of right female breast: Secondary | ICD-10-CM

## 2022-10-29 DIAGNOSIS — Z5112 Encounter for antineoplastic immunotherapy: Secondary | ICD-10-CM | POA: Diagnosis not present

## 2022-10-29 DIAGNOSIS — Z86718 Personal history of other venous thrombosis and embolism: Secondary | ICD-10-CM

## 2022-10-29 LAB — CMP (CANCER CENTER ONLY)
ALT: 19 U/L (ref 0–44)
AST: 25 U/L (ref 15–41)
Albumin: 4.4 g/dL (ref 3.5–5.0)
Alkaline Phosphatase: 86 U/L (ref 38–126)
Anion gap: 6 (ref 5–15)
BUN: 19 mg/dL (ref 6–20)
CO2: 29 mmol/L (ref 22–32)
Calcium: 8.8 mg/dL — ABNORMAL LOW (ref 8.9–10.3)
Chloride: 102 mmol/L (ref 98–111)
Creatinine: 0.92 mg/dL (ref 0.44–1.00)
GFR, Estimated: >60 mL/min (ref >60)
Glucose, Bld: 88 mg/dL (ref 70–99)
Potassium: 3.7 mmol/L (ref 3.5–5.1)
Sodium: 137 mmol/L (ref 135–145)
Total Bilirubin: 0.5 mg/dL (ref 0.3–1.2)
Total Protein: 7.1 g/dL (ref 6.5–8.1)

## 2022-10-29 LAB — CBC WITH DIFFERENTIAL (CANCER CENTER ONLY)
Abs Immature Granulocytes: 0 10*3/uL (ref 0.00–0.07)
Basophils Absolute: 0.1 10*3/uL (ref 0.0–0.1)
Basophils Relative: 2 %
Eosinophils Absolute: 0 10*3/uL (ref 0.0–0.5)
Eosinophils Relative: 0 %
HCT: 37.7 % (ref 36.0–46.0)
Hemoglobin: 12.8 g/dL (ref 12.0–15.0)
Immature Granulocytes: 0 %
Lymphocytes Relative: 39 %
Lymphs Abs: 1.2 10*3/uL (ref 0.7–4.0)
MCH: 33.9 pg (ref 26.0–34.0)
MCHC: 34 g/dL (ref 30.0–36.0)
MCV: 99.7 fL (ref 80.0–100.0)
Monocytes Absolute: 0.4 10*3/uL (ref 0.1–1.0)
Monocytes Relative: 12 %
Neutro Abs: 1.5 10*3/uL — ABNORMAL LOW (ref 1.7–7.7)
Neutrophils Relative %: 47 %
Platelet Count: 159 10*3/uL (ref 150–400)
RBC: 3.78 MIL/uL — ABNORMAL LOW (ref 3.87–5.11)
RDW: 13 % (ref 11.5–15.5)
WBC Count: 3.2 10*3/uL — ABNORMAL LOW (ref 4.0–10.5)
nRBC: 0 % (ref 0.0–0.2)

## 2022-10-29 LAB — PROTIME-INR
INR: 1.2 (ref 0.8–1.2)
Prothrombin Time: 14.9 seconds (ref 11.4–15.2)

## 2022-10-29 NOTE — Telephone Encounter (Signed)
Scheduled appointment per 12/19 los. Patient is aware. 

## 2022-10-29 NOTE — Assessment & Plan Note (Signed)
08/04/2020: Right breast biopsy T2 N0 M1 stage IV IDC grade 3, triple positive with a Ki-67 of 40%, multiple liver lesions biopsy was positive for breast cancer ER/PR positive HER2 negative with a Ki-67 30% 08/31/2020-12/14/2020: TCHP x6   Current treatment: letrozole and palbociclib (started 01/29/2021) Treatment toxicities: Severe constipation: She had constipation problems even prior to starting treatments.  She is using Dulcolax as needed Gastritis: I sent a prescription for Pepcid Neutropenia: Secondary to Ibrance Severe fatigue: I reduce the dosage of Ibrance to 75 mg today   COVID infection January 2023: Held Leslee Home and Herceptin treatments.  Resumed 02/06/2022   01/17/2022: CT CAP: Very subtle metastatic lesion of the caudate lobe is unchanged.  No new metastatic disease elsewhere.  07/06/22: CT CAP: Stable very subtle metastatic lesion in the caudate lobe of the liver. No new metastatic disease elsewhere.  06/13/2022: Brain MRI: Negative 10/24/2022: CT CAP: Multiple new hepatic lesions measuring up to 1.5 cm consistent with metastatic disease, stable 5 mm nodule left costophrenic sulcus  Treatment plan: Recommend rebiopsy of the liver to determine the treatment plan. Switch to Verzinio and add Faslodex

## 2022-10-29 NOTE — Progress Notes (Signed)
Patient Care Team: Nicholas Lose, MD as PCP - General (Hematology and Oncology) Prudence Davidson, [place] Bing, MD as Referring Physician (Oncology) Rockwell Germany, RN as Oncology Nurse Navigator Mauro Kaufmann, RN as Oncology Nurse Navigator Rolm Bookbinder, MD as Consulting Physician (General Surgery) Kyung Rudd, MD as Consulting Physician (Radiation Oncology) Bo Merino, MD as Consulting Physician (Rheumatology) Olga Millers, MD as Consulting Physician (Obstetrics and Gynecology) Larey Dresser, MD as Consulting Physician (Cardiology) Armbruster, Carlota Raspberry, MD as Consulting Physician (Gastroenterology) Nena Jordan, MD as Referring Physician (Ophthalmology) Nena Jordan, MD as Referring Physician (Ophthalmology) Nicholas Lose, MD as Consulting Physician (Hematology and Oncology)  DIAGNOSIS:  Encounter Diagnosis  Name Primary?   Malignant neoplasm of lower-outer quadrant of right breast of female, estrogen receptor positive (Erica Wood) Yes    SUMMARY OF ONCOLOGIC HISTORY: Oncology History  Malignant neoplasm of lower-outer quadrant of right breast of female, estrogen receptor positive (Erica Wood)  08/04/2020 Initial Diagnosis   Right breast lower outer quadrant biopsy 08/04/2020 for a clinical T2 N0 M1, stage IV invasive ductal carcinoma, grade 3, triple positive, with an MIB-1 of 40%. abdominal MRI 08/24/2020 shows multiple liver lesions highly suspicious for liver metastases. Liver biopsy 09/20/2020 positive for carcinoma, prognostic panel estrogen and progesterone receptor positive, but HER-2 negative (0); MIB-1 of 30%   08/31/2020 - 12/14/2020 Chemotherapy   Neoadjuvant chemotherapy consisting of docetaxel, carboplatin, trastuzumab and pertuzumab every 21 days x 6    12/26/2020 Imaging   breast MRI 12/26/2020 showed right breast index lesion decreased to 0.8 cm; 2 additional masses in the right breast stable abdominal MRI 01/29/2021 shows no active disease in the liver     Chemotherapy   Herceptin Maintenance initially q 3 weeks, now q 4 weeks   01/29/2021 -  Anti-estrogen oral therapy   anastrozole and palbociclib    Malignant neoplasm metastatic to liver (Erica Wood)  01/03/2021 Initial Diagnosis   Liver metastases (Erica Wood)   01/03/2021 -  Chemotherapy   Patient is on Treatment Plan : BREAST Trastuzumab q21d       CHIEF COMPLIANT: Follow-up Metastatic breast  breast cancer on herceptin and Ibrance    INTERVAL HISTORY: Erica Wood is a 56 y.o. with above-mentioned history of  Metastatic breast cancer, currently on chemotherapy with Herceptin. She presents to the clinic today for a follow-up. She reports the nausea is better but it comes and goes.   ALLERGIES:  is allergic to beef-derived products, latex, sulfa antibiotics, and tape.  MEDICATIONS:  Current Outpatient Medications  Medication Sig Dispense Refill   acetaminophen (TYLENOL) 500 MG tablet Take 1,000 mg by mouth every 6 (six) hours as needed for moderate pain or headache.     amphetamine-dextroamphetamine (ADDERALL XR) 20 MG 24 hr capsule Take 1 capsule (20 mg total) by mouth daily. 30 capsule 0   Ascorbic Acid (VITAMIN C PO) Take 3,000 mg by mouth daily.     Cholecalciferol (VITAMIN D3) 75 MCG (3000 UT) TABS Take 3,000 Units by mouth daily.     Clindamycin-Benzoyl Per, Refr, gel Apply topically.     estradiol (ESTRACE VAGINAL) 0.1 MG/GM vaginal cream Place 1 Applicatorful vaginally 3 (three) times a week. 42.5 g 12   famotidine (PEPCID) 20 MG tablet Take 20 mg by mouth at bedtime.     hydroxychloroquine (PLAQUENIL) 200 MG tablet Take 1 tablet (200 mg total) by mouth daily. 90 tablet 4   IBRANCE 75 MG tablet TAKE 1 TABLET BY MOUTH ONCE  DAILY WITH OR  WITHOUT FOOD FOR  21 DAYS, FOLLOWED BY 7 DAYS OFF, REPEATED EVERY 28 DAYS 21 tablet 6   letrozole (FEMARA) 2.5 MG tablet Take 1 tablet (2.5 mg total) by mouth daily. 90 tablet 3   metoCLOPramide (REGLAN) 10 MG tablet Take 1 tablet (10 mg total)  by mouth as needed. 30 tablet 3   spironolactone (ALDACTONE) 25 MG tablet Take 25 mg by mouth daily as needed (swelling).     traMADol (ULTRAM) 50 MG tablet Take 1-2 tablets (50-100 mg total) by mouth every 6 (six) hours as needed. 60 tablet 0   tretinoin (RETIN-A) 0.1 % cream Apply 1 application topically at bedtime as needed (acne).      triamterene-hydrochlorothiazide (DYAZIDE) 37.5-25 MG capsule TAKE ONE CAPSULE BY MOUTH DAILY 90 capsule 1   warfarin (COUMADIN) 5 MG tablet TAKE 1 TABLET(5 MG) BY MOUTH DAILY 30 tablet PRN   No current facility-administered medications for this visit.    PHYSICAL EXAMINATION: ECOG PERFORMANCE STATUS: 1 - Symptomatic but completely ambulatory  Vitals:   10/29/22 1131  BP: (!) 130/48  Pulse: 70  Resp: 18  Temp: (!) 97.2 F (36.2 C)  SpO2: 100%   Filed Weights   10/29/22 1131  Weight: 116 lb 3.2 oz (52.7 kg)      LABORATORY DATA:  I have reviewed the data as listed    Latest Ref Rng & Units 10/16/2022    1:42 PM 09/04/2022    1:39 PM 07/24/2022    1:57 PM  CMP  Glucose 70 - 99 mg/dL 89  93  91   BUN 6 - 20 mg/dL _0 Creatinine 0.44 - 1.00 mg/dL 0.87  0.97  0.90   Sodium 135 - 145 mmol/L 136  139  139   Potassium 3.5 - 5.1 mmol/L 3.9  4.1  4.0   Chloride 98 - 111 mmol/L 104  105  105   CO2 22 - 32 mmol/L _1 Calcium 8.9 - 10.3 mg/dL 9.1  8.7  9.1   Total Protein 6.5 - 8.1 g/dL 6.3  6.7  6.5   Total Bilirubin 0.3 - 1.2 mg/dL 0.3  0.5  0.6   Alkaline Phos 38 - 126 U/L 87  76  75   AST 15 - 41 U/L _2 ALT 0 - 44 U/L _3 Lab Results  Component Value Date   WBC 3.2 (L) 10/29/2022   HGB 12.8 10/29/2022   HCT 37.7 10/29/2022   MCV 99.7 10/29/2022   PLT 159 10/29/2022   NEUTROABS 1.5 (L) 10/29/2022    ASSESSMENT & PLAN:  Malignant neoplasm of lower-outer quadrant of right breast of female, estrogen receptor positive (Erica Wood) 08/04/2020: Right breast biopsy T2 N0 M1 stage IV IDC grade 3, triple  positive with a Ki-67 of 40%, multiple liver lesions biopsy was positive for breast cancer ER/PR positive HER2 negative with a Ki-67 30% 08/31/2020-12/14/2020: TCHP x6   Current treatment: letrozole and palbociclib (started 01/29/2021) Treatment toxicities: Severe constipation: She had constipation problems even prior to starting treatments.  She is using Dulcolax as needed Gastritis: I sent a prescription for Pepcid Neutropenia: Secondary to Ibrance Severe fatigue: I reduce the dosage of Ibrance to 75 mg today   COVID infection January 2023: Held Erica Wood and Herceptin treatments.  Resumed 02/06/2022   01/17/2022: CT CAP: Very subtle metastatic lesion of the caudate lobe  is unchanged.  No new metastatic disease elsewhere.  07/06/22: CT CAP: Stable very subtle metastatic lesion in the caudate lobe of the liver. No new metastatic disease elsewhere.  06/13/2022: Brain MRI: Negative 10/24/2022: CT CAP: Multiple new hepatic lesions measuring up to 1.5 cm consistent with metastatic disease, stable 5 mm nodule left costophrenic sulcus  Treatment plan:  We will obtain Guardant360 to see if there are any mutations that we can identify.  Patient is not at all interested in repeating another liver biopsy. I recommended switching treatment to Verzinio with Faslodex.  She will think about it. We discussed other options including Xeloda as well as exemestane and everolimus. We will check to see if she can be a candidate for [place] 1 clinical trial.  Return to clinic in 3 weeks to discuss results and come up and finalize her treatment plan. No orders of the defined types were placed in this encounter.  The patient has a good understanding of the overall plan. she agrees with it. she will call with any problems that may develop before the next visit here. Total time spent: 30 mins including face to face time and time spent for planning, charting and co-ordination of care   Harriette Ohara,  MD 10/29/22    I Gardiner Coins am acting as a Education administrator for [employer]  I have reviewed the above documentation for accuracy and completeness, and I agree with the above.

## 2022-10-30 LAB — CANCER ANTIGEN 27.29: CA 27.29: 543.1 U/mL — ABNORMAL HIGH (ref 0.0–38.6)

## 2022-11-01 ENCOUNTER — Telehealth: Payer: Self-pay

## 2022-11-01 NOTE — Telephone Encounter (Signed)
Notified Patient of completion of FMLA forms. Fax transmission confirmation received. Copy of forms mailed to Patient as requested. No other needs or concerns voiced at this time. 

## 2022-11-05 ENCOUNTER — Encounter: Payer: Self-pay | Admitting: Hematology and Oncology

## 2022-11-06 ENCOUNTER — Telehealth: Payer: Self-pay | Admitting: Hematology and Oncology

## 2022-11-06 NOTE — Telephone Encounter (Signed)
Rescheduled appointment per provider on-call. Patient is aware of the changes made to her upcoming appointment. 

## 2022-11-07 ENCOUNTER — Other Ambulatory Visit: Payer: Self-pay | Admitting: Rheumatology

## 2022-11-07 LAB — GUARDANT 360

## 2022-11-07 MED ORDER — AMPHETAMINE-DEXTROAMPHET ER 20 MG PO CP24: 20.0000 mg | ORAL_CAPSULE | Freq: Every day | ORAL | 0 refills | Status: DC

## 2022-11-07 NOTE — Telephone Encounter (Signed)
Patient called the office requesting a refill of Adderall '20mg'$  to be sent to [facility] on [address].

## 2022-11-07 NOTE — Telephone Encounter (Signed)
Next Visit: 01/30/2023   Last Visit: 09/24/2022   Last Fill: 10/11/2022   Current Dose per office note on 09/24/2022: not discussed   Okay to refill Adderall?

## 2022-11-12 NOTE — Progress Notes (Signed)
Patient Care Team: Nicholas Lose, MD as PCP - General (Hematology and Oncology) Prudence Davidson, [place] Bing, MD as Referring Physician (Oncology) Rockwell Germany, RN as Oncology Nurse Navigator Mauro Kaufmann, RN as Oncology Nurse Navigator Rolm Bookbinder, MD as Consulting Physician (General Surgery) Kyung Rudd, MD as Consulting Physician (Radiation Oncology) Bo Merino, MD as Consulting Physician (Rheumatology) Olga Millers, MD as Consulting Physician (Obstetrics and Gynecology) Larey Dresser, MD as Consulting Physician (Cardiology) Armbruster, Carlota Raspberry, MD as Consulting Physician (Gastroenterology) Nena Jordan, MD as Referring Physician (Ophthalmology) Nena Jordan, MD as Referring Physician (Ophthalmology) Nicholas Lose, MD as Consulting Physician (Hematology and Oncology)  DIAGNOSIS:  Encounter Diagnoses  Name Primary?   Malignant neoplasm of lower-outer quadrant of right breast of female, estrogen receptor positive ([place]) Yes   Malignant neoplasm metastatic to liver ([place])     SUMMARY OF ONCOLOGIC HISTORY: Oncology History  Malignant neoplasm of lower-outer quadrant of right breast of female, estrogen receptor positive ([place])  08/04/2020 Initial Diagnosis   Right breast lower outer quadrant biopsy 08/04/2020 for a clinical T2 N0 M1, stage IV invasive ductal carcinoma, grade 3, triple positive, with an MIB-1 of 40%. abdominal MRI 08/24/2020 shows multiple liver lesions highly suspicious for liver metastases. Liver biopsy 09/20/2020 positive for carcinoma, prognostic panel estrogen and progesterone receptor positive, but HER-2 negative (0); MIB-1 of 30%   08/31/2020 - 12/14/2020 Chemotherapy   Neoadjuvant chemotherapy consisting of docetaxel, carboplatin, trastuzumab and pertuzumab every 21 days x 6    12/26/2020 Imaging   breast MRI 12/26/2020 showed right breast index lesion decreased to 0.8 cm; 2 additional masses in the right breast stable abdominal MRI  01/29/2021 shows no active disease in the liver    Chemotherapy   Herceptin Maintenance initially q 3 weeks, now q 4 weeks   01/29/2021 -  Anti-estrogen oral therapy   anastrozole and palbociclib    Malignant neoplasm metastatic to liver ([place])  01/03/2021 Initial Diagnosis   Liver metastases ([place])   01/03/2021 -  Chemotherapy   Patient is on Treatment Plan : BREAST Trastuzumab q21d       CHIEF COMPLIANT:  Follow-up Metastatic breast  breast cancer to finalize treatment plan    INTERVAL HISTORY: Erica Wood is a  57 y.o. with above-mentioned history of  Metastatic breast cancer who progressed on Herceptin along with letrozole and palbociclib.  3 weeks ago we discussed obtaining Guardant360 and I propose several treatment options.  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  She is here today accompanied by her father to discuss different treatment options and finalize with the treatment plan.  Her major issues are that no treatment should cause her quality of life issues.  Especially hair loss.  ALLERGIES:  is allergic to beef-derived products, latex, sulfa antibiotics, and tape.  MEDICATIONS:  Current Outpatient Medications  Medication Sig Dispense Refill   olaparib (LYNPARZA) 100 MG tablet Take 2 tablets (200 mg total) by mouth 2 (two) times daily. Swallow whole. May take with food to decrease nausea and vomiting. 60 tablet 3   acetaminophen (TYLENOL) 500 MG tablet Take 1,000 mg by mouth every 6 (six) hours as needed for moderate pain or headache.     amphetamine-dextroamphetamine (ADDERALL XR) 20 MG 24 hr capsule Take 1 capsule (20 mg total) by mouth daily. 30 capsule 0   Ascorbic Acid (VITAMIN C PO) Take 3,000 mg by mouth daily.     Cholecalciferol (VITAMIN D3) 75 MCG (3000 UT) TABS  Take 3,000 Units by mouth daily.     Clindamycin-Benzoyl Per, Refr, gel Apply topically.     estradiol (ESTRACE VAGINAL) 0.1 MG/GM vaginal cream Place 1 Applicatorful vaginally 3  (three) times a week. 42.5 g 12   famotidine (PEPCID) 20 MG tablet Take 20 mg by mouth at bedtime.     hydroxychloroquine (PLAQUENIL) 200 MG tablet Take 1 tablet (200 mg total) by mouth daily. 90 tablet 4   metoCLOPramide (REGLAN) 10 MG tablet Take 1 tablet (10 mg total) by mouth as needed. 30 tablet 3   spironolactone (ALDACTONE) 25 MG tablet Take 25 mg by mouth daily as needed (swelling).     traMADol (ULTRAM) 50 MG tablet Take 1-2 tablets (50-100 mg total) by mouth every 6 (six) hours as needed. 60 tablet 0   tretinoin (RETIN-A) 0.1 % cream Apply 1 application topically at bedtime as needed (acne).      triamterene-hydrochlorothiazide (DYAZIDE) 37.5-25 MG capsule TAKE ONE CAPSULE BY MOUTH DAILY 90 capsule 1   warfarin (COUMADIN) 5 MG tablet TAKE 1 TABLET(5 MG) BY MOUTH DAILY 30 tablet PRN   No current facility-administered medications for this visit.    PHYSICAL EXAMINATION: ECOG PERFORMANCE STATUS: 1 - Symptomatic but completely ambulatory  Vitals:   11/19/22 1147  BP: 127/66  Pulse: 67  Resp: 18  Temp: 97.9 F (36.6 C)  SpO2: 100%   Filed Weights   11/19/22 1147  Weight: 116 lb 3.2 oz (52.7 kg)     LABORATORY DATA:  I have reviewed the data as listed    Latest Ref Rng & Units 10/29/2022   12:23 PM 10/16/2022    1:42 PM 09/04/2022    1:39 PM  CMP  Glucose 70 - 99 mg/dL 88  89  93   BUN 6 - 20 mg/dL _0 Creatinine 0.44 - 1.00 mg/dL 0.92  0.87  0.97   Sodium 135 - 145 mmol/L 137  136  139   Potassium 3.5 - 5.1 mmol/L 3.7  3.9  4.1   Chloride 98 - 111 mmol/L 102  104  105   CO2 22 - 32 mmol/L _1 Calcium 8.9 - 10.3 mg/dL 8.8  9.1  8.7   Total Protein 6.5 - 8.1 g/dL 7.1  6.3  6.7   Total Bilirubin 0.3 - 1.2 mg/dL 0.5  0.3  0.5   Alkaline Phos 38 - 126 U/L 86  87  76   AST 15 - 41 U/L _2 ALT 0 - 44 U/L _3 Lab Results  Component Value Date   WBC 3.2 (L) 10/29/2022   HGB 12.8 10/29/2022   HCT 37.7 10/29/2022   MCV 99.7  10/29/2022   PLT 159 10/29/2022   NEUTROABS 1.5 (L) 10/29/2022    ASSESSMENT & PLAN:  Malignant neoplasm of lower-outer quadrant of right breast of female, estrogen receptor positive ([place]) 08/04/2020: Right breast biopsy T2 N0 M1 stage IV IDC grade 3, triple positive with a Ki-67 of 40%, multiple liver lesions biopsy was positive for breast cancer ER/PR positive HER2 negative with a Ki-67 30% 08/31/2020-12/14/2020: TCHP x6   Current treatment: letrozole and palbociclib (started 01/29/2021) Treatment toxicities: Severe constipation: She had constipation problems even prior to starting treatments.  She is using Dulcolax as needed Gastritis: I sent a prescription for Pepcid Neutropenia: Secondary to Ibrance Severe fatigue: I reduce the dosage  of Ibrance to 75 mg today   COVID infection January 2023: Held [place] and Herceptin treatments.  Resumed 02/06/2022   01/17/2022: CT CAP: Very subtle metastatic lesion of the caudate lobe is unchanged.  No new metastatic disease elsewhere.  07/06/22: CT CAP: Stable very subtle metastatic lesion in the caudate lobe of the liver. No new metastatic disease elsewhere.  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  Will refer the patient to genetic testing  Previously described treatment options include switching to Verzinio with Faslodex versus Xeloda versus exemestane with everolimus versus olaparib.  We finally decided to treat her with olaparib. Olaparib: Lonie Peak is a PARP inhibitor which has been approved for metastatic breast cancer with BRCA2 mutation The [employer]mpiAD trial is a phase 3 randomized trial that compared the efficacy and safety of olaparib with those of single-agent chemotherapy (physician's choice) in women  with  HER2-negative metastatic breast cancer   At a median follow-up of 14 months, progression-free survival was 2.8 months longer and the risk for disease progression or death was 42% lower with olaparib monotherapy than with chemotherapy. Dose: We will start the patient at 200 mg p.o. twice daily and we can titrate the dose upward or downward depending on her symptoms.  Adverse effects counseling: Olaparib can cause gastrointestinal toxicity, cytopenias, fatigue, generalized weakness etc.  Return to clinic in 2 weeks for labs and follow-up with Jenny Reichmann.    Orders Placed This Encounter  Procedures   NM PET Image Initial (PI) Skull Base To Thigh    Standing Status:   Future    Standing Expiration Date:   11/19/2023    Order Specific Question:   If indicated for the ordered procedure, I authorize the administration of a radiopharmaceutical per Radiology protocol    Answer:   Yes    Order Specific Question:   Is the patient pregnant?    Answer:   No    Order Specific Question:   Preferred imaging location?    Answer:   [facility]    Order Specific Question:   Release to patient    Answer:   Immediate   The patient has a good understanding of the overall plan. she agrees with it. she will call with any problems that may develop before the next visit here. Total time spent: 30 mins including face to face time and time spent for planning, charting and co-ordination of care   Harriette Ohara, MD 11/19/22    I Gardiner Coins am acting as a Education administrator for [employer]  I have reviewed the above documentation for accuracy and completeness, and I agree with the above.

## 2022-11-19 ENCOUNTER — Ambulatory Visit: Payer: 59 | Admitting: Hematology and Oncology

## 2022-11-19 ENCOUNTER — Telehealth: Payer: Self-pay | Admitting: Pharmacy Technician

## 2022-11-19 ENCOUNTER — Encounter: Payer: Self-pay | Admitting: Hematology and Oncology

## 2022-11-19 ENCOUNTER — Telehealth: Payer: Self-pay

## 2022-11-19 ENCOUNTER — Other Ambulatory Visit: Payer: Self-pay

## 2022-11-19 ENCOUNTER — Other Ambulatory Visit (HOSPITAL_COMMUNITY): Payer: Self-pay

## 2022-11-19 ENCOUNTER — Inpatient Hospital Stay: Payer: 59 | Attending: Oncology | Admitting: Hematology and Oncology

## 2022-11-19 VITALS — BP 127/66 | HR 67 | Temp 97.9°F | Resp 18 | Wt 116.2 lb

## 2022-11-19 DIAGNOSIS — Z79899 Other long term (current) drug therapy: Secondary | ICD-10-CM | POA: Insufficient documentation

## 2022-11-19 DIAGNOSIS — C50511 Malignant neoplasm of lower-outer quadrant of right female breast: Secondary | ICD-10-CM | POA: Diagnosis present

## 2022-11-19 DIAGNOSIS — Z7901 Long term (current) use of anticoagulants: Secondary | ICD-10-CM | POA: Insufficient documentation

## 2022-11-19 DIAGNOSIS — C787 Secondary malignant neoplasm of liver and intrahepatic bile duct: Secondary | ICD-10-CM | POA: Diagnosis not present

## 2022-11-19 DIAGNOSIS — Z170 Estrogen receptor positive status [ER+]: Secondary | ICD-10-CM | POA: Diagnosis not present

## 2022-11-19 MED ORDER — OLAPARIB 100 MG PO TABS
200.0000 mg | ORAL_TABLET | Freq: Two times a day (BID) | ORAL | 3 refills | Status: DC
  Filled 2022-11-19: qty 60, 15d supply, fill #0

## 2022-11-19 NOTE — Assessment & Plan Note (Addendum)
08/04/2020: Right breast biopsy T2 N0 M1 stage IV IDC grade 3, triple positive with a Ki-67 of 40%, multiple liver lesions biopsy was positive for breast cancer ER/PR positive HER2 negative with a Ki-67 30% 08/31/2020-12/14/2020: TCHP x6   Current treatment: letrozole and palbociclib (started 01/29/2021) Treatment toxicities: Severe constipation: She had constipation problems even prior to starting treatments.  She is using Dulcolax as needed Gastritis: I sent a prescription for Pepcid Neutropenia: Secondary to Ibrance Severe fatigue: I reduce the dosage of Ibrance to 75 mg today   COVID infection January 2023: Held [facility] and Herceptin treatments.  Resumed 02/06/2022   01/17/2022: CT CAP: Very subtle metastatic lesion of the caudate lobe is unchanged.  No new metastatic disease elsewhere.  07/06/22: CT CAP: Stable very subtle metastatic lesion in the caudate lobe of the liver. No new metastatic disease elsewhere.  06/13/2022: Brain MRI: Negative 10/24/2022: CT CAP: Multiple new hepatic lesions measuring up to 1.5 cm consistent with metastatic disease, stable 5 mm nodule left costophrenic sulcus 12/05/2021: Guardant360: ATM gene mutation (possible treatment option olaparib), MSI high: Not detected  Previously described treatment options include switching to Verzinio with Faslodex versus Xeloda versus exemestane with everolimus versus olaparib.

## 2022-11-19 NOTE — Telephone Encounter (Signed)
Oral Oncology Patient Advocate Encounter   Was successful in obtaining a copay card for Erica Wood.  This copay card will make the patients copay $0.  I have spoken with the patient.    The billing information is as follows and has been shared with [facility].   RxBin: [id number] PCN: [id number] Member ID: [id number] Group ID: [id number]   Lady Deutscher, CPhT-Adv Oncology Pharmacy Patient [place] Direct Number: [phone]  Fax: [phone]

## 2022-11-19 NOTE — Telephone Encounter (Signed)
Oral Oncology Patient Advocate Encounter   Received notification that prior authorization for Lonie Peak is required.   PA submitted on 11/19/22 Key [id number] Status is pending     Lady Deutscher, CPhT-Adv Oncology Pharmacy Patient [place] Direct Number: [phone]  Fax: [phone]

## 2022-11-19 NOTE — Telephone Encounter (Signed)
Oral Oncology Pharmacist Encounter  Received new prescription for olaparib Lonie Peak) for the treatment of ER positive, HER2 negative breast cancer, planned duration until disease progression or unacceptable toxicity.  Labs from 10/29/22 assessed, no interventions needed. Prescription dose and frequency assessed.  Current medication list in Epic reviewed, no significant DDIs with [place] identified.  Evaluated chart and no patient barriers to medication adherence noted.   Patient agreement for treatment documented in MD note on 11/19/22.  Prescription has been e-scribed to the [facility] for benefits analysis and approval.  Oral Oncology Clinic will continue to follow for insurance authorization, copayment issues, initial counseling and start date.  Drema Halon, PharmD Hematology/Oncology Clinical Pharmacist [facility] [phone] 11/19/2022 1:11 PM

## 2022-11-19 NOTE — Telephone Encounter (Addendum)
Oral Oncology Patient Advocate Encounter  Prior Authorization for Lonie Peak has been approved.    PA# [id number] Effective dates: 11/19/22 through 11/20/23  Patient must fill at [place], [place] Patient [place] Direct Number: [phone]  Fax: [phone]

## 2022-11-20 ENCOUNTER — Other Ambulatory Visit: Payer: Self-pay | Admitting: *Deleted

## 2022-11-20 MED ORDER — OLAPARIB 100 MG PO TABS: 200.0000 mg | ORAL_TABLET | Freq: Two times a day (BID) | ORAL | 3 refills | Status: DC

## 2022-11-20 MED ORDER — HYDROXYCHLOROQUINE SULFATE 200 MG PO TABS: 200.0000 mg | ORAL_TABLET | Freq: Every day | ORAL | 0 refills | Status: DC

## 2022-11-20 NOTE — Telephone Encounter (Signed)
Patient contacted the office requesting refill for PLQ.  Next Visit: 01/30/2023  Last Visit: 09/24/2022  Labs: 10/29/2022 Calcium 8.8, WBC 3.2, RBC 3.78, Neutro Abs 1.5  Eye exam: 04/02/2022 WNL    Current Dose per office note 09/24/2022:  Plaquenil 200 mg 1 tablet by mouth daily.   DX:SLE (systemic lupus erythematosus related syndrome)   Okay to refill Plaquenil?

## 2022-11-21 ENCOUNTER — Other Ambulatory Visit: Payer: Self-pay | Admitting: *Deleted

## 2022-11-21 MED ORDER — LORAZEPAM 0.5 MG PO TABS: 0.5000 mg | ORAL_TABLET | Freq: Two times a day (BID) | ORAL | 0 refills | Status: DC

## 2022-11-21 NOTE — Progress Notes (Signed)
Received call from pt with complaint claustrophobia with upcoming pet scan.  Verbal orders received from MD for pt to be prescribed Ativan 0.5 mg p.o tablet x2 with the instructions for pt to take one tablet one hour prior to scan and second tablet 30 minutes prior to scan.  Pt educated that a driver will be needed.  Prescription called in to pharmacy on file.

## 2022-11-26 NOTE — Telephone Encounter (Signed)
Oral Chemotherapy Pharmacist Encounter  I spoke with patient today for overview of: Lonie Peak for the treatment of ER positive, HER2 negative breast cancer, planned duration until disease progression or unacceptable toxicity.   Counseled patient on administration, dosing, side effects, monitoring, drug-food interactions, safe handling, storage, and disposal.  Patient will take Lynparza '100mg'$  tablets, 2 tablets ('200mg'$ ) by mouth 2 times daily without regard to food.  Patient counseled that administering with food may increase tolerability.  Patient knows to avoid grapefruit or grapefruit juice while on therapy with Lynparza.  Lonie Peak start date: 11/27/2022   Adverse effects include but are not limited to: nausea, vomiting, diarrhea, taste changes, mouth sores, fatigue, constipation, decreased blood counts, and joint pain. Patient informed that pneumonitis (including some fatalities) has occurred rarely. Myelodysplastic syndrome/acute myeloid leukemia (MDS/AML) have been reported (rarely) in clinical trials.  Patient has anti-emetic on hand and knows to take it if nausea develops.   Patient will obtain anti diarrheal and alert the office of 4 or more loose stools above baseline.  Reviewed with patient importance of keeping a medication schedule and plan for any missed doses. No barriers to medication adherence identified.  Medication reconciliation performed and medication/allergy list updated.  Insurance authorization for Lonie Peak has been obtained. Test claim at the pharmacy revealed copayment $50 for 1st fill of 30 days. [facility] was given a copay card to cover patients copay. Patient is appreciative. Patient received medication on 11/26/22 from [facility].   Patient informed the pharmacy will reach out 5-7 days prior to needing next fill of Lynparza to coordinate continued medication acquisition to prevent break in therapy.  All questions answered.  Patient voiced  understanding and appreciation.   Medication education handout placed in mail for patient. Patient knows to call the office with questions or concerns. Oral Chemotherapy Clinic phone number provided to patient.   Drema Halon, PharmD Hematology/Oncology Clinical Pharmacist [facility] [phone] 11/26/2022   1:00 PM

## 2022-11-29 ENCOUNTER — Other Ambulatory Visit: Payer: Self-pay | Admitting: Hematology and Oncology

## 2022-11-29 MED ORDER — ONDANSETRON HCL 8 MG PO TABS: 8.0000 mg | ORAL_TABLET | Freq: Three times a day (TID) | ORAL | 6 refills | Status: AC | PRN

## 2022-12-02 ENCOUNTER — Other Ambulatory Visit (HOSPITAL_COMMUNITY): Payer: Self-pay

## 2022-12-03 ENCOUNTER — Inpatient Hospital Stay: Payer: 59

## 2022-12-03 ENCOUNTER — Other Ambulatory Visit: Payer: Self-pay

## 2022-12-03 ENCOUNTER — Inpatient Hospital Stay: Payer: 59 | Admitting: Pharmacist

## 2022-12-03 VITALS — BP 114/63 | HR 69 | Temp 97.4°F | Resp 18

## 2022-12-03 DIAGNOSIS — C50511 Malignant neoplasm of lower-outer quadrant of right female breast: Secondary | ICD-10-CM | POA: Diagnosis not present

## 2022-12-03 DIAGNOSIS — Z170 Estrogen receptor positive status [ER+]: Secondary | ICD-10-CM

## 2022-12-03 DIAGNOSIS — Z86718 Personal history of other venous thrombosis and embolism: Secondary | ICD-10-CM

## 2022-12-03 LAB — CBC WITH DIFFERENTIAL (CANCER CENTER ONLY)
Abs Immature Granulocytes: 0.01 10*3/uL (ref 0.00–0.07)
Basophils Absolute: 0.2 10*3/uL — ABNORMAL HIGH (ref 0.0–0.1)
Basophils Relative: 3 %
Eosinophils Absolute: 0 10*3/uL (ref 0.0–0.5)
Eosinophils Relative: 0 %
HCT: 39.1 % (ref 36.0–46.0)
Hemoglobin: 13.2 g/dL (ref 12.0–15.0)
Immature Granulocytes: 0 %
Lymphocytes Relative: 27 %
Lymphs Abs: 1.3 10*3/uL (ref 0.7–4.0)
MCH: 33.6 pg (ref 26.0–34.0)
MCHC: 33.8 g/dL (ref 30.0–36.0)
MCV: 99.5 fL (ref 80.0–100.0)
Monocytes Absolute: 0.5 10*3/uL (ref 0.1–1.0)
Monocytes Relative: 10 %
Neutro Abs: 2.9 10*3/uL (ref 1.7–7.7)
Neutrophils Relative %: 60 %
Platelet Count: 219 10*3/uL (ref 150–400)
RBC: 3.93 MIL/uL (ref 3.87–5.11)
RDW: 12.5 % (ref 11.5–15.5)
WBC Count: 4.9 10*3/uL (ref 4.0–10.5)
nRBC: 0 % (ref 0.0–0.2)

## 2022-12-03 LAB — CMP (CANCER CENTER ONLY)
ALT: 18 U/L (ref 0–44)
AST: 25 U/L (ref 15–41)
Albumin: 4 g/dL (ref 3.5–5.0)
Alkaline Phosphatase: 93 U/L (ref 38–126)
Anion gap: 5 (ref 5–15)
BUN: 12 mg/dL (ref 6–20)
CO2: 29 mmol/L (ref 22–32)
Calcium: 9.1 mg/dL (ref 8.9–10.3)
Chloride: 102 mmol/L (ref 98–111)
Creatinine: 1.1 mg/dL — ABNORMAL HIGH (ref 0.44–1.00)
GFR, Estimated: 60 mL/min — ABNORMAL LOW (ref >60)
Glucose, Bld: 109 mg/dL — ABNORMAL HIGH (ref 70–99)
Potassium: 3.7 mmol/L (ref 3.5–5.1)
Sodium: 136 mmol/L (ref 135–145)
Total Bilirubin: 0.4 mg/dL (ref 0.3–1.2)
Total Protein: 6.3 g/dL — ABNORMAL LOW (ref 6.5–8.1)

## 2022-12-03 LAB — PROTIME-INR
INR: 1.5 — ABNORMAL HIGH (ref 0.8–1.2)
Prothrombin Time: 17.7 seconds — ABNORMAL HIGH (ref 11.4–15.2)

## 2022-12-03 NOTE — Progress Notes (Signed)
Erica [facility]       Telephone: [phone]?Fax: [phone]   Oncology Clinical Pharmacist Practitioner Initial Assessment  Erica Wood is a 57 y.o. female with a diagnosis of breast cancer. They were contacted today via in-person visit. Patient did not want height or weight recorded today.  Indication/Regimen Olaparib Erica Wood) is being used appropriately for treatment of metastatic breast cancer by Dr. Nicholas Lose.      Wt Readings from Last 1 Encounters:  11/19/22 116 lb 3.2 oz (52.7 kg)    Estimated body surface area is 1.54 meters squared as calculated from the following:   Height as of 10/29/22: '5\' 4"'$  (1.626 m).   Weight as of 11/19/22: 116 lb 3.2 oz (52.7 kg).  The dosing regimen is 200 mg by mouth every 12 hours on days 1 to 28 of a 28-day cycle. This is being given  monotherapy. It is planned to continue until disease progression or unacceptable toxicity.  Erica Wood was seen today by clinical pharmacy to establish care for her olaparib medication after being referred by Dr. Nicholas Lose.  She states that she started olaparib on 11/29/22 and so far is tolerating it well.  Her serum creatinine was slightly elevated today and we did review the importance of drinking plenty of fluids.  We also went over possible side effects of olaparib which include but are not limited to MDS/AML, pneumonitis, VTE, nausea, vomiting, fatigue, anemia, headache, diarrhea, neutropenia, decreased appetite, taste abnormalities, dizziness, and arthralgias.  We also discussed to avoid grapefruit and Seville oranges products.  We did proper storage and handling and what she should do if she misses a dose.  Have a PET scan scheduled for 12/05/22.  We did discuss today that she will likely be on olaparib until unacceptable toxicity or progression.  Dr. Lindi Adie is starting her on a reduced dose of 200 mg every 12 hours.  We discussed that we could increase this dose up to 300 mg every 12  hours if tolerated.  She verbalized understanding of the plan.  Erica Wood reports that she has IBS with constipation that she manages through her diet.  She states that she has tried prochlorperazine in the past for nausea but it severely constipated her.  She states that promethazine has been effective.  Currently she only uses metoclopramide as needed and she will continue doing so for now.  She does have ondansetron at home for nausea as needed.  We did state that there is a chance that this could also constipate her and that we will need to monitor her closely if she does take ondansetron.  We did discuss with her that prochlorperazine and ondansetron have different mechanisms of action.  She is on hydroxychloroquine and is followed by [facility] rheumatology.  She has a follow-up appointment with them on 01/30/2023.  Dr. Eunice Blase manages her warfarin and she states that she sees him on an as-needed basis.  She states she last saw him about 2 months ago.  We did ask her specifics about her INR goals and she was not aware of them.  She stated that her INR at 1.5 today "is what it should be at".  We will discuss with Dr. Lindi Adie how this should best be managed going forward.  We did discuss that she could potentially be seen at the [facility] health VTE clinic and she said that she would consider this at a later time.  She states that she did try  rivaroxaban in the past but it made her feel "awful".  We did remind her today that if she was to hit her head that she should report to the ED immediately as she is at higher risk for brain bleeds.  She verbalized understanding of the plan.  We also discussed that Levaquin can interact with many different additional agents, herbal supplements, and food.  She states that she monitors her diet very closely because of the IBS.  She reports taking spironolactone for lower extremity edema.  She also has triamterene/hydrochlorothiazide if the swelling gets "really bad".   Potassium levels today are WNL.  She did inquire about a herbal supplement called nutrafol. With regard to vitamins, minerals, and herbal medications, it is important to realize that these are not tested or regulated by the FDA. Therefore, they have not been studied as extensively as prescription medications, and their safety profile is not as well known. In addition, their purity cannot be guaranteed. Many studies have shown that the ingredients listed on a bottle of an herbal supplement may not actually be contained inside. The [employer] website: TheyParty.dk may provide some useful information.    Per manufacturing guidelines, she will be seen monthly for a CMP and CBC with differential.  She will next see Dr. Lindi Adie approximately in 4 weeks and he will likely be contacting her to review her PET scan after it results.  Dose Modifications Dr. Lindi Adie has started olaparib at a reduced dose of 200 mg every 12 hours  Access Assessment Erica Wood will be receiving olaparib through [place] Concerns: None Start date if known: 11/29/22  Allergies Allergies  Allergen Reactions   Beef-Derived Products Hives   Latex Rash    Contact   Sulfa Antibiotics Rash   Tape Rash    Vitals    12/03/2022   12:59 PM 11/19/2022   11:47 AM 10/29/2022   11:31 AM  Oncology Vitals  Height   163 cm  Weight  52.708 kg 52.708 kg  Weight (lbs)  116 lbs 3 oz 116 lbs 3 oz  BMI  19.95 kg/m2   19.95 kg/m2 19.95 kg/m2   19.95 kg/m2  Temp 97.4 F (36.3 C) 97.9 F (36.6 C) 97.2 F (36.2 C)  Pulse Rate 69 67 70  BP 114/63 127/66 130/48  Resp '18 18 18  '$ SpO2 98 % 100 % 100 %  BSA (m2)  1.54 m2   1.54 m2 1.54 m2   1.54 m2     Laboratory Data    Latest Ref Rng & Units 12/03/2022   12:50 PM 10/29/2022   12:23 PM 10/16/2022    1:42 PM  CBC EXTENDED  WBC 4.0 - 10.5 K/uL 4.9  3.2   3.0   RBC 3.87 - 5.11 MIL/uL 3.93  3.78  3.55   Hemoglobin 12.0 - 15.0 g/dL 13.2  12.8  12.1   HCT 36.0 - 46.0 % 39.1  37.7  35.4   Platelets 150 - 400 K/uL 219  159  148   NEUT# 1.7 - 7.7 K/uL 2.9  1.5  1.5   Lymph# 0.7 - 4.0 K/uL 1.3  1.2  1.2        Latest Ref Rng & Units 12/03/2022   12:50 PM 10/29/2022   12:23 PM 10/16/2022    1:42 PM  CMP  Glucose 70 - 99 mg/dL 109  88  89   BUN 6 - 20 mg/dL 12  19  14  Creatinine 0.44 - 1.00 mg/dL 1.10  0.92  0.87   Sodium 135 - 145 mmol/L 136  137  136   Potassium 3.5 - 5.1 mmol/L 3.7  3.7  3.9   Chloride 98 - 111 mmol/L 102  102  104   CO2 22 - 32 mmol/L '29  29  29   '$ Calcium 8.9 - 10.3 mg/dL 9.1  8.8  9.1   Total Protein 6.5 - 8.1 g/dL 6.3  7.1  6.3   Total Bilirubin 0.3 - 1.2 mg/dL 0.4  0.5  0.3   Alkaline Phos 38 - 126 U/L 93  86  87   AST 15 - 41 U/L '25  25  22   '$ ALT 0 - 44 U/L '18  19  15    '$ No results found for: "MG" Lab Results  Component Value Date   CA2729 543.1 (H) 10/29/2022   CA2729 383.5 (H) 10/16/2022   CA2729 401.8 (H) 09/04/2022     Contraindications Contraindications were reviewed?  Yes Contraindications to therapy were identified?  No  Safety Precautions The following safety precautions for the use of olaparib were reviewed:  Fever: reviewed the importance of having a thermometer and the Centers for Disease Control and Prevention (CDC) definition of fever which is 100.62F (38C) or higher. Patient should call 24/7 triage at [phone] if experiencing a fever or any other symptoms MDS/AML N/V/D Fatigue Pneumonitis Anemia Headache Neutropenia Arthralgia VTE Avoid grapefruit products and Seville oranges Storage and Handling  Medication Reconciliation Current Outpatient Medications  Medication Sig Dispense Refill   amphetamine-dextroamphetamine (ADDERALL XR) 20 MG 24 hr capsule Take 1 capsule (20 mg total) by mouth daily. 30 capsule 0   Ascorbic Acid (VITAMIN C PO) Take 3,000 mg by mouth daily.      Cholecalciferol (VITAMIN D3) 75 MCG (3000 UT) TABS Take 3,000 Units by mouth daily.     estradiol (ESTRACE VAGINAL) 0.1 MG/GM vaginal cream Place 1 Applicatorful vaginally 3 (three) times a week. 42.5 g 12   famotidine (PEPCID) 20 MG tablet Take 20 mg by mouth at bedtime.     hydroxychloroquine (PLAQUENIL) 200 MG tablet Take 1 tablet (200 mg total) by mouth daily. 90 tablet 0   metoCLOPramide (REGLAN) 10 MG tablet Take 1 tablet (10 mg total) by mouth as needed. (Patient taking differently: Take 10 mg by mouth every 4 (four) hours as needed for nausea or vomiting.) 30 tablet 3   olaparib (LYNPARZA) 100 MG tablet Take 2 tablets (200 mg total) by mouth 2 (two) times daily. Swallow whole. May take with food to decrease nausea and vomiting. 120 tablet 3   ondansetron (ZOFRAN) 8 MG tablet Take 1 tablet (8 mg total) by mouth every 8 (eight) hours as needed for nausea or vomiting. (Patient not taking: Reported on 12/03/2022) 30 tablet 6   spironolactone (ALDACTONE) 25 MG tablet Take 25 mg by mouth daily as needed (swelling).     warfarin (COUMADIN) 5 MG tablet TAKE 1 TABLET(5 MG) BY MOUTH DAILY 30 tablet PRN   acetaminophen (TYLENOL) 500 MG tablet Take 1,000 mg by mouth every 6 (six) hours as needed for moderate pain or headache. (Patient not taking: Reported on 12/03/2022)     Clindamycin-Benzoyl Per, Refr, gel Apply topically. (Patient not taking: Reported on 12/03/2022)     LORazepam (ATIVAN) 0.5 MG tablet Take 1 tablet (0.5 mg total) by mouth 2 (two) times daily. Take prior to scan for claustrophobia. (Patient not taking: Reported on 12/03/2022) 2 tablet  0   traMADol (ULTRAM) 50 MG tablet Take 1-2 tablets (50-100 mg total) by mouth every 6 (six) hours as needed. (Patient not taking: Reported on 12/03/2022) 60 tablet 0   tretinoin (RETIN-A) 0.1 % cream Apply 1 application topically at bedtime as needed (acne).  (Patient not taking: Reported on 12/03/2022)     triamterene-hydrochlorothiazide (DYAZIDE) 37.5-25 MG  capsule TAKE ONE CAPSULE BY MOUTH DAILY (Patient taking differently: Take 1 capsule by mouth as needed.) 90 capsule 1   No current facility-administered medications for this visit.   Medication reconciliation is based on the patient's most recent medication list in the electronic medical record (EMR) including herbal products and OTC medications.   The patient's medication list was reviewed today with the patient?  Yes  Drug-drug interactions (DDIs) DDIs were evaluated?  Yes Significant DDIs identified?  Yes, there is a potential for increased potassium levels since the patient is taking spironolactone and a combination pill with triamterene.  We will monitor.  WNL today.  Drug-Food Interactions Drug-food interactions were evaluated? Yes Drug-food interactions identified? Avoid grapefruit products and Seville oranges  Follow-up Plan  Continue olaparib 200 mg by mouth every 12 hours.  She does prefer to take this with food.  This dose may be increased if she continues to tolerate it well. Continue metoclopramide as needed for nausea.  May consider adding ondansetron as needed.  Will need to closely monitor constipation as she has IBS with constipation that is managed by diet currently. She will continue to follow with Dr. Junius Roads for her warfarin management.  She will discuss with him her latest INR readings from today.  She will consider possibly using the [place] VTE clinic in the future.  Are happy to help with that referral if needed. She will continue to follow with [facility] health rheumatology.  They manage her hydroxychloroquine We will add labs, Dr. Lindi Adie visit, in 4 weeks We will add labs, pharmacy clinic visit, in 54 weeks  Latoy Lenahan Michaelis participated in the discussion, expressed understanding, and voiced agreement with the above plan. All questions were answered to her satisfaction. The patient was advised to contact the clinic at [phone] with any questions or concerns  prior to her return visit.   I spent 60 minutes assessing the patient.  Raina Mina, RPH-CPP, 12/03/2022 2:39 PM  **Disclaimer: This note was dictated with voice recognition software. Similar sounding words can inadvertently be transcribed and this note may contain transcription errors which may not have been corrected upon publication of note.**

## 2022-12-04 ENCOUNTER — Telehealth: Payer: Self-pay | Admitting: Hematology and Oncology

## 2022-12-04 LAB — CANCER ANTIGEN 27.29: CA 27.29: 809.9 U/mL — ABNORMAL HIGH (ref 0.0–38.6)

## 2022-12-04 NOTE — Telephone Encounter (Signed)
Scheduled appointments per 1/23 los. Patient is aware of the made appointments.

## 2022-12-05 ENCOUNTER — Encounter (HOSPITAL_COMMUNITY): Payer: Self-pay | Admitting: Radiology

## 2022-12-05 ENCOUNTER — Encounter (HOSPITAL_COMMUNITY)
Admission: RE | Admit: 2022-12-05 | Discharge: 2022-12-05 | Disposition: A | Discharge status: Home / Self Care | Payer: 59 | Source: Ambulatory Visit | Attending: Hematology and Oncology | Admitting: Hematology and Oncology

## 2022-12-05 DIAGNOSIS — C50511 Malignant neoplasm of lower-outer quadrant of right female breast: Secondary | ICD-10-CM | POA: Insufficient documentation

## 2022-12-05 DIAGNOSIS — C787 Secondary malignant neoplasm of liver and intrahepatic bile duct: Secondary | ICD-10-CM | POA: Insufficient documentation

## 2022-12-05 DIAGNOSIS — Z170 Estrogen receptor positive status [ER+]: Secondary | ICD-10-CM | POA: Insufficient documentation

## 2022-12-05 LAB — GLUCOSE, CAPILLARY: Glucose-Capillary: 97 mg/dL (ref 70–99)

## 2022-12-05 MED ORDER — FLUDEOXYGLUCOSE F - 18 (FDG) INJECTION
5.8000 | Freq: Once | INTRAVENOUS | Status: AC
  Administered 2022-12-05: 5.78 via INTRAVENOUS

## 2022-12-10 ENCOUNTER — Other Ambulatory Visit: Payer: Self-pay

## 2022-12-10 MED ORDER — AMPHETAMINE-DEXTROAMPHET ER 20 MG PO CP24: 20.0000 mg | ORAL_CAPSULE | Freq: Every day | ORAL | 0 refills | Status: DC

## 2022-12-10 NOTE — Telephone Encounter (Signed)
Patient called requesting a refill of adderall to [facility] on [address].   Next Visit: 01/30/2023  Last Visit: 09/24/2022  Last Fill: 11/07/2022  Current Dose per office note on 09/24/2022: not discussed.   Okay to refill adderall?

## 2022-12-26 ENCOUNTER — Other Ambulatory Visit: Payer: Self-pay | Admitting: Hematology and Oncology

## 2022-12-30 NOTE — Progress Notes (Signed)
Patient Care Team: Nicholas Lose, MD as PCP - General (Hematology and Oncology) Prudence Davidson, [place] Bing, MD as Referring Physician (Oncology) Rockwell Germany, RN as Oncology Nurse Navigator Mauro Kaufmann, RN as Oncology Nurse Navigator Rolm Bookbinder, MD as Consulting Physician (General Surgery) Kyung Rudd, MD as Consulting Physician (Radiation Oncology) Bo Merino, MD as Consulting Physician (Rheumatology) Olga Millers, MD as Consulting Physician (Obstetrics and Gynecology) Larey Dresser, MD as Consulting Physician (Cardiology) Armbruster, Carlota Raspberry, MD as Consulting Physician (Gastroenterology) Nena Jordan, MD as Referring Physician (Ophthalmology) Nena Jordan, MD as Referring Physician (Ophthalmology) Nicholas Lose, MD as Consulting Physician (Hematology and Oncology) Raina Mina, RPH-CPP as Pharmacist (Hematology and Oncology)  DIAGNOSIS: No diagnosis found.  SUMMARY OF ONCOLOGIC HISTORY: Oncology History  Malignant neoplasm of lower-outer quadrant of right breast of female, estrogen receptor positive ([place])  08/04/2020 Initial Diagnosis   Right breast lower outer quadrant biopsy 08/04/2020 for a clinical T2 N0 M1, stage IV invasive ductal carcinoma, grade 3, triple positive, with an MIB-1 of 40%. abdominal MRI 08/24/2020 shows multiple liver lesions highly suspicious for liver metastases. Liver biopsy 09/20/2020 positive for carcinoma, prognostic panel estrogen and progesterone receptor positive, but HER-2 negative (0); MIB-1 of 30%   08/31/2020 - 12/14/2020 Chemotherapy   Neoadjuvant chemotherapy consisting of docetaxel, carboplatin, trastuzumab and pertuzumab every 21 days x 6    12/26/2020 Imaging   breast MRI 12/26/2020 showed right breast index lesion decreased to 0.8 cm; 2 additional masses in the right breast stable abdominal MRI 01/29/2021 shows no active disease in the liver    Chemotherapy   Herceptin Maintenance initially q 3 weeks, now  q 4 weeks   01/29/2021 -  Anti-estrogen oral therapy   anastrozole and palbociclib    Malignant neoplasm metastatic to liver ([place])  01/03/2021 Initial Diagnosis   Liver metastases ([place])   01/03/2021 - 10/16/2022 Chemotherapy   Patient is on Treatment Plan : BREAST Trastuzumab q21d       CHIEF COMPLIANT:   INTERVAL HISTORY: Erica Wood is a   ALLERGIES:  is allergic to beef-derived products, latex, sulfa antibiotics, and tape.  MEDICATIONS:  Current Outpatient Medications  Medication Sig Dispense Refill   ondansetron (ZOFRAN) 8 MG tablet Take 1 tablet (8 mg total) by mouth every 8 (eight) hours as needed for nausea or vomiting. (Patient not taking: Reported on 12/03/2022) 30 tablet 6   acetaminophen (TYLENOL) 500 MG tablet Take 1,000 mg by mouth every 6 (six) hours as needed for moderate pain or headache. (Patient not taking: Reported on 12/03/2022)     amphetamine-dextroamphetamine (ADDERALL XR) 20 MG 24 hr capsule Take 1 capsule (20 mg total) by mouth daily. 30 capsule 0   Ascorbic Acid (VITAMIN C PO) Take 3,000 mg by mouth daily.     Cholecalciferol (VITAMIN D3) 75 MCG (3000 UT) TABS Take 3,000 Units by mouth daily.     Clindamycin-Benzoyl Per, Refr, gel Apply topically. (Patient not taking: Reported on 12/03/2022)     estradiol (ESTRACE VAGINAL) 0.1 MG/GM vaginal cream Place 1 Applicatorful vaginally 3 (three) times a week. 42.5 g 12   famotidine (PEPCID) 20 MG tablet Take 20 mg by mouth at bedtime.     hydroxychloroquine (PLAQUENIL) 200 MG tablet Take 1 tablet (200 mg total) by mouth daily. 90 tablet 0   LORazepam (ATIVAN) 0.5 MG tablet Take 1 tablet (0.5 mg total) by mouth 2 (two) times daily. Take prior to scan for claustrophobia. (Patient not taking: Reported  on 12/03/2022) 2 tablet 0   metoCLOPramide (REGLAN) 10 MG tablet Take 1 tablet (10 mg total) by mouth as needed. (Patient taking differently: Take 10 mg by mouth every 4 (four) hours as needed for nausea or vomiting.)  30 tablet 3   olaparib (LYNPARZA) 100 MG tablet Take 2 tablets (200 mg total) by mouth 2 (two) times daily. Swallow whole. May take with food to decrease nausea and vomiting. 120 tablet 3   spironolactone (ALDACTONE) 25 MG tablet Take 25 mg by mouth daily as needed (swelling).     traMADol (ULTRAM) 50 MG tablet Take 1-2 tablets (50-100 mg total) by mouth every 6 (six) hours as needed. (Patient not taking: Reported on 12/03/2022) 60 tablet 0   tretinoin (RETIN-A) 0.1 % cream Apply 1 application topically at bedtime as needed (acne).  (Patient not taking: Reported on 12/03/2022)     triamterene-hydrochlorothiazide (DYAZIDE) 37.5-25 MG capsule TAKE 1 CAPSULE BY MOUTH DAILY 90 capsule 1   warfarin (COUMADIN) 5 MG tablet TAKE 1 TABLET(5 MG) BY MOUTH DAILY 30 tablet PRN   No current facility-administered medications for this visit.    PHYSICAL EXAMINATION: ECOG PERFORMANCE STATUS: {CHL ONC ECOG PS:[id number]}  There were no vitals filed for this visit. There were no vitals filed for this visit.  BREAST:*** No palpable masses or nodules in either right or left breasts. No palpable axillary supraclavicular or infraclavicular adenopathy no breast tenderness or nipple discharge. (exam performed in the presence of a chaperone)  LABORATORY DATA:  I have reviewed the data as listed    Latest Ref Rng & Units 12/03/2022   12:50 PM 10/29/2022   12:23 PM 10/16/2022    1:42 PM  CMP  Glucose 70 - 99 mg/dL 109  88  89   BUN 6 - 20 mg/dL 12  19  14   $ Creatinine 0.44 - 1.00 mg/dL 1.10  0.92  0.87   Sodium 135 - 145 mmol/L 136  137  136   Potassium 3.5 - 5.1 mmol/L 3.7  3.7  3.9   Chloride 98 - 111 mmol/L 102  102  104   CO2 22 - 32 mmol/L 29  29  29   $ Calcium 8.9 - 10.3 mg/dL 9.1  8.8  9.1   Total Protein 6.5 - 8.1 g/dL 6.3  7.1  6.3   Total Bilirubin 0.3 - 1.2 mg/dL 0.4  0.5  0.3   Alkaline Phos 38 - 126 U/L 93  86  87   AST 15 - 41 U/L 25  25  22   $ ALT 0 - 44 U/L 18  19  15     $ Lab Results   Component Value Date   WBC 4.9 12/03/2022   HGB 13.2 12/03/2022   HCT 39.1 12/03/2022   MCV 99.5 12/03/2022   PLT 219 12/03/2022   NEUTROABS 2.9 12/03/2022    ASSESSMENT & PLAN:  No problem-specific Assessment & Plan notes found for this encounter.    No orders of the defined types were placed in this encounter.  The patient has a good understanding of the overall plan. she agrees with it. she will call with any problems that may develop before the next visit here. Total time spent: 30 mins including face to face time and time spent for planning, charting and co-ordination of care   Suzzette Righter, [place] 12/30/22    I Gardiner Coins am acting as a Education administrator for [employer]  ***

## 2022-12-31 ENCOUNTER — Inpatient Hospital Stay: Payer: 59 | Attending: Oncology

## 2022-12-31 ENCOUNTER — Other Ambulatory Visit: Payer: Self-pay

## 2022-12-31 ENCOUNTER — Telehealth: Payer: 59 | Admitting: Hematology and Oncology

## 2022-12-31 ENCOUNTER — Inpatient Hospital Stay (HOSPITAL_BASED_OUTPATIENT_CLINIC_OR_DEPARTMENT_OTHER): Payer: 59 | Admitting: Hematology and Oncology

## 2022-12-31 VITALS — BP 108/49 | HR 63 | Temp 97.9°F | Resp 17 | Wt 116.0 lb

## 2022-12-31 DIAGNOSIS — Z79899 Other long term (current) drug therapy: Secondary | ICD-10-CM | POA: Insufficient documentation

## 2022-12-31 DIAGNOSIS — Z8616 Personal history of COVID-19: Secondary | ICD-10-CM | POA: Diagnosis not present

## 2022-12-31 DIAGNOSIS — C787 Secondary malignant neoplasm of liver and intrahepatic bile duct: Secondary | ICD-10-CM | POA: Insufficient documentation

## 2022-12-31 DIAGNOSIS — Z170 Estrogen receptor positive status [ER+]: Secondary | ICD-10-CM | POA: Insufficient documentation

## 2022-12-31 DIAGNOSIS — C50511 Malignant neoplasm of lower-outer quadrant of right female breast: Secondary | ICD-10-CM

## 2022-12-31 DIAGNOSIS — Z86718 Personal history of other venous thrombosis and embolism: Secondary | ICD-10-CM

## 2022-12-31 LAB — CMP (CANCER CENTER ONLY)
ALT: 24 U/L (ref 0–44)
AST: 30 U/L (ref 15–41)
Albumin: 4.1 g/dL (ref 3.5–5.0)
Alkaline Phosphatase: 106 U/L (ref 38–126)
Anion gap: 3 — ABNORMAL LOW (ref 5–15)
BUN: 13 mg/dL (ref 6–20)
CO2: 31 mmol/L (ref 22–32)
Calcium: 8.7 mg/dL — ABNORMAL LOW (ref 8.9–10.3)
Chloride: 101 mmol/L (ref 98–111)
Creatinine: 1.06 mg/dL — ABNORMAL HIGH (ref 0.44–1.00)
GFR, Estimated: >60 mL/min (ref >60)
Glucose, Bld: 96 mg/dL (ref 70–99)
Potassium: 4 mmol/L (ref 3.5–5.1)
Sodium: 135 mmol/L (ref 135–145)
Total Bilirubin: 0.4 mg/dL (ref 0.3–1.2)
Total Protein: 6.6 g/dL (ref 6.5–8.1)

## 2022-12-31 LAB — CBC WITH DIFFERENTIAL (CANCER CENTER ONLY)
Abs Immature Granulocytes: 0.01 10*3/uL (ref 0.00–0.07)
Basophils Absolute: 0.1 10*3/uL (ref 0.0–0.1)
Basophils Relative: 2 %
Eosinophils Absolute: 0.1 10*3/uL (ref 0.0–0.5)
Eosinophils Relative: 1 %
HCT: 39.5 % (ref 36.0–46.0)
Hemoglobin: 13.5 g/dL (ref 12.0–15.0)
Immature Granulocytes: 0 %
Lymphocytes Relative: 30 %
Lymphs Abs: 1.8 10*3/uL (ref 0.7–4.0)
MCH: 33.3 pg (ref 26.0–34.0)
MCHC: 34.2 g/dL (ref 30.0–36.0)
MCV: 97.3 fL (ref 80.0–100.0)
Monocytes Absolute: 0.5 10*3/uL (ref 0.1–1.0)
Monocytes Relative: 8 %
Neutro Abs: 3.5 10*3/uL (ref 1.7–7.7)
Neutrophils Relative %: 59 %
Platelet Count: 139 10*3/uL — ABNORMAL LOW (ref 150–400)
RBC: 4.06 MIL/uL (ref 3.87–5.11)
RDW: 12.4 % (ref 11.5–15.5)
WBC Count: 6 10*3/uL (ref 4.0–10.5)
nRBC: 0 % (ref 0.0–0.2)

## 2022-12-31 LAB — PROTIME-INR
INR: 1.4 — ABNORMAL HIGH (ref 0.8–1.2)
Prothrombin Time: 16.8 seconds — ABNORMAL HIGH (ref 11.4–15.2)

## 2022-12-31 NOTE — Assessment & Plan Note (Addendum)
08/04/2020: Right breast biopsy T2 N0 M1 stage IV IDC grade 3, triple positive with a Ki-67 of 40%, multiple liver lesions biopsy was positive for breast cancer ER/PR positive HER2 negative with a Ki-67 30% 08/31/2020-12/14/2020: [facility] x6  Letrozole and palbociclib (started 01/29/2021)  COVID infection January 2023: Held [facility] and Herceptin treatments.  Resumed 02/06/2022   01/17/2022: CT CAP: Very subtle metastatic lesion of the caudate lobe is unchanged.  No new metastatic disease elsewhere.  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  Will refer the patient to genetic testing  Current treatment: Olaparib started 11/29/2022 (200 mg p.o. twice daily) Treatment toxicities:    Return to clinic in 4 weeks for labs and follow-up with Jenny Reichmann.

## 2022-12-31 NOTE — Progress Notes (Signed)
HEMATOLOGY-ONCOLOGY TELEPHONE VISIT PROGRESS NOTE  I connected with our patient on 01/01/23 at  3:45 PM EST by telephone and verified that I am speaking with the correct person using two identifiers.  I discussed the limitations, risks, security and privacy concerns of performing an evaluation and management service by telephone and the availability of in person appointments.  I also discussed with the patient that there may be a patient responsible charge related to this service. The patient expressed understanding and agreed to proceed.   History of Present Illness: Erica Wood is a 57 y.o. with above-mentioned history of  Metastatic breast cancer who progressed on who is currently on olaparib. She presents to the clinic for a telephone follow-up to review labs.  She had blood work yesterday and is connecting with me to discuss her lab results.  Oncology History  Malignant neoplasm of lower-outer quadrant of right breast of female, estrogen receptor positive ([place])  08/04/2020 Initial Diagnosis   Right breast lower outer quadrant biopsy 08/04/2020 for a clinical T2 N0 M1, stage IV invasive ductal carcinoma, grade 3, triple positive, with an MIB-1 of 40%. abdominal MRI 08/24/2020 shows multiple liver lesions highly suspicious for liver metastases. Liver biopsy 09/20/2020 positive for carcinoma, prognostic panel estrogen and progesterone receptor positive, but HER-2 negative (0); MIB-1 of 30%   08/31/2020 - 12/14/2020 Chemotherapy   Neoadjuvant chemotherapy consisting of docetaxel, carboplatin, trastuzumab and pertuzumab every 21 days x 6    12/26/2020 Imaging   breast MRI 12/26/2020 showed right breast index lesion decreased to 0.8 cm; 2 additional masses in the right breast stable abdominal MRI 01/29/2021 shows no active disease in the liver    Chemotherapy   Herceptin Maintenance initially q 3 weeks, now q 4 weeks   01/29/2021 -  Anti-estrogen oral therapy   anastrozole and palbociclib     Malignant neoplasm metastatic to liver ([place])  01/03/2021 Initial Diagnosis   Liver metastases ([place])   01/03/2021 - 10/16/2022 Chemotherapy   Patient is on Treatment Plan : BREAST Trastuzumab q21d       REVIEW OF SYSTEMS:   Constitutional: Denies fevers, chills or abnormal weight loss All other systems were reviewed with the patient and are negative. Observations/Objective:     Assessment Plan:  Malignant neoplasm of lower-outer quadrant of right breast of female, estrogen receptor positive ([place]) 08/04/2020: Right breast biopsy T2 N0 M1 stage IV IDC grade 3, triple positive with a Ki-67 of 40%, multiple liver lesions biopsy was positive for breast cancer ER/PR positive HER2 negative with a Ki-67 30% 08/31/2020-12/14/2020: TCHP x6  Letrozole and palbociclib (started 01/29/2021)  COVID infection January 2023: Held [facility] and Herceptin treatments.  Resumed 02/06/2022   01/17/2022: CT CAP: Very subtle metastatic lesion of the caudate lobe is unchanged.  No new metastatic disease elsewhere.  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  Will refer the patient to genetic testing  Current treatment: Olaparib started 11/29/2022 (200 mg p.o. twice daily) dose increased to 300 p.o. twice daily 01/01/2023  Treatment toxicities: Denies any major adverse effects to olaparib.  Lab review: CA 27-29: 1049 (was 809) Based on the above result I recommended that we increase the dosage of olaparib to 300 mg p.o. twice daily She will start taking 3 tablets in the morning and 3 tablets in  the evening that supply that she has at home.  I sent a new prescription to her specialty pharmacy to increase the dosage to 300 mg twice a day.  We are going to recheck her labs in a  month and a telephone visit after that to discuss results.    I discussed the assessment and treatment plan with the patient. The patient was provided an opportunity to ask questions and all were answered. The patient agreed with the plan and demonstrated an understanding of the instructions. The patient was advised to call back or seek an in-person evaluation if the symptoms worsen or if the condition fails to improve as anticipated.   I provided 12 minutes of non-face-to-face time during this encounter.  This includes time for charting and coordination of care   Harriette Ohara, MD  sacroiliac

## 2023-01-01 ENCOUNTER — Inpatient Hospital Stay (HOSPITAL_BASED_OUTPATIENT_CLINIC_OR_DEPARTMENT_OTHER): Payer: 59 | Admitting: Hematology and Oncology

## 2023-01-01 DIAGNOSIS — Z170 Estrogen receptor positive status [ER+]: Secondary | ICD-10-CM

## 2023-01-01 DIAGNOSIS — C50511 Malignant neoplasm of lower-outer quadrant of right female breast: Secondary | ICD-10-CM

## 2023-01-01 LAB — CANCER ANTIGEN 27.29: CA 27.29: 1049 U/mL — ABNORMAL HIGH (ref 0.0–38.6)

## 2023-01-01 MED ORDER — OLAPARIB 150 MG PO TABS: 300.0000 mg | ORAL_TABLET | Freq: Two times a day (BID) | ORAL | 3 refills | Status: DC

## 2023-01-01 NOTE — Assessment & Plan Note (Signed)
08/04/2020: Right breast biopsy T2 N0 M1 stage IV IDC grade 3, triple positive with a Ki-67 of 40%, multiple liver lesions biopsy was positive for breast cancer ER/PR positive HER2 negative with a Ki-67 30% 08/31/2020-12/14/2020: TCHP x6  Letrozole and palbociclib (started 01/29/2021)  COVID infection January 2023: Held [facility] and Herceptin treatments.  Resumed 02/06/2022   01/17/2022: CT CAP: Very subtle metastatic lesion of the caudate lobe is unchanged.  No new metastatic disease elsewhere.  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  Will refer the patient to genetic testing  Current treatment: Olaparib started 11/29/2022 (200 mg p.o. twice daily) dose increased to 300 p.o. twice daily 01/01/2023  Treatment toxicities: Denies any major adverse effects to olaparib.  Lab review: CA 27-29: 1049 (was 809) Based on the above result I recommended that we increase the dosage of olaparib to 300 mg p.o. twice daily She will start taking 3 tablets in the morning and 3 tablets in the evening that supply that she has at home.  I sent a new prescription to her specialty pharmacy to increase the dosage to 300 mg twice a day.  We are going to recheck her labs in a month and a telephone visit after that to discuss results.

## 2023-01-02 ENCOUNTER — Telehealth: Payer: Self-pay | Admitting: Hematology and Oncology

## 2023-01-02 NOTE — Telephone Encounter (Signed)
Scheduled appointment per 2/21 los. Patient is aware.

## 2023-01-08 ENCOUNTER — Other Ambulatory Visit: Payer: Self-pay | Admitting: *Deleted

## 2023-01-08 MED ORDER — AMPHETAMINE-DEXTROAMPHET ER 20 MG PO CP24: 20.0000 mg | ORAL_CAPSULE | Freq: Every day | ORAL | 0 refills | Status: DC

## 2023-01-08 NOTE — Telephone Encounter (Signed)
Patient left message requesting a refill on Adderall to [facility].    Next Visit: 01/30/2023   Last Visit: 09/24/2022   Last Fill: 12/10/2022   Current Dose per office note on 09/24/2022: not discussed.    Okay to refill adderall?

## 2023-01-13 ENCOUNTER — Inpatient Hospital Stay: Payer: 59 | Attending: Oncology | Admitting: Genetic Counselor

## 2023-01-13 ENCOUNTER — Other Ambulatory Visit: Payer: Self-pay | Admitting: Genetic Counselor

## 2023-01-13 ENCOUNTER — Other Ambulatory Visit: Payer: Self-pay

## 2023-01-13 ENCOUNTER — Inpatient Hospital Stay: Payer: 59

## 2023-01-13 DIAGNOSIS — C787 Secondary malignant neoplasm of liver and intrahepatic bile duct: Secondary | ICD-10-CM

## 2023-01-13 DIAGNOSIS — Z170 Estrogen receptor positive status [ER+]: Secondary | ICD-10-CM

## 2023-01-13 DIAGNOSIS — C50511 Malignant neoplasm of lower-outer quadrant of right female breast: Secondary | ICD-10-CM | POA: Diagnosis not present

## 2023-01-13 DIAGNOSIS — R5382 Chronic fatigue, unspecified: Secondary | ICD-10-CM | POA: Insufficient documentation

## 2023-01-13 DIAGNOSIS — Z8616 Personal history of COVID-19: Secondary | ICD-10-CM | POA: Insufficient documentation

## 2023-01-13 LAB — GENETIC SCREENING ORDER

## 2023-01-15 ENCOUNTER — Encounter: Payer: Self-pay | Admitting: Hematology and Oncology

## 2023-01-15 ENCOUNTER — Encounter: Payer: Self-pay | Admitting: Genetic Counselor

## 2023-01-15 NOTE — Progress Notes (Signed)
REFERRING PROVIDER: Nicholas Lose, MD [place],  [place] [address]  PRIMARY PROVIDER:  Nicholas Lose, MD  PRIMARY REASON FOR VISIT:  1. Malignant neoplasm of lower-outer quadrant of right breast of female, estrogen receptor positive ([place])   2. Malignant neoplasm metastatic to liver [facility])     HISTORY OF PRESENT ILLNESS:   Ms. Vozzella, a 57 y.o. female, was seen for a [place] cancer genetics consultation at the request of Dr. Lindi Adie due to a personal history of metastatic breast cancer and a potential germline finding in the ATM gene by somatic testing.  Ms. Herrero presents to clinic today to discuss the possibility of a hereditary predisposition to cancer, to discuss genetic testing, and to further clarify her future cancer risks, as well as potential cancer risks for family members.   In 2021, at the age of 57, Ms. Weist was diagnosed with invasive ductal carcinoma (ER+/PR+/HER2+) with metastases to liver.  In December 2023, [employer] somatic testing revealed potential germline finding in ATM at c.[id number] (p.K367f) with 54.4% cfDNA.     CANCER HISTORY:  Oncology History  Malignant neoplasm of lower-outer quadrant of right breast of female, estrogen receptor positive ([facility]  08/04/2020 Initial Diagnosis   Right breast lower outer quadrant biopsy 08/04/2020 for a clinical T2 N0 M1, stage IV invasive ductal carcinoma, grade 3, triple positive, with an MIB-1 of 40%. abdominal MRI 08/24/2020 shows multiple liver lesions highly suspicious for liver metastases. Liver biopsy 09/20/2020 positive for carcinoma, prognostic panel estrogen and progesterone receptor positive, but HER-2 negative (0); MIB-1 of 30%   08/31/2020 - 12/14/2020 Chemotherapy   Neoadjuvant chemotherapy consisting of docetaxel, carboplatin, trastuzumab and pertuzumab every 21 days x 6    12/26/2020 Imaging   breast MRI 12/26/2020 showed right breast index lesion decreased to 0.8 cm; 2 additional  masses in the right breast stable abdominal MRI 01/29/2021 shows no active disease in the liver    Chemotherapy   Herceptin Maintenance initially q 3 weeks, now q 4 weeks   01/29/2021 -  Anti-estrogen oral therapy   anastrozole and palbociclib    Malignant neoplasm metastatic to liver ([facility]  01/03/2021 Initial Diagnosis   Liver metastases ([facility]   01/03/2021 - 10/16/2022 Chemotherapy   Patient is on Treatment Plan : BREAST Trastuzumab q21d       Past Medical History:  Diagnosis Date   Anti-cardiolipin antibody positive    Anti-cardiolipin antibody syndrome (HCC)    Anxiety    Arthralgia    Arthritis    bil knees, hands   Atypical chest pain    Autoimmune disease ([facility]    Autoimmune disease ([facility] 11/25/2016   Breast cancer metastasized to liver ([facility] 09/2020   Cancer ([facility] 07/2020   right breast IDC   Chronic fatigue    Depression    DVT (deep venous thrombosis) (HCC)    IBS (irritable bowel syndrome)    with constipation   Lupus ([facility]    Lymphedema    bilateral legs per patient   [place]us ([facility] [place]    Raynaud's syndrome    [place] ([facility]    Thrombocytopenia ([facility]     Past Surgical History:  Procedure Laterality Date   ABLATION  12/2018   leg veins   ABLATION Right 08/2019   venous leg    APPENDECTOMY     COLON SURGERY     Perforated colon repair after colostomy   EYE SURGERY Bilateral 2022   GANGLION CYST EXCISION     HYSTEROSCOPY  WITH D & C N/A 01/13/2014   Procedure: DILATATION AND CURETTAGE /HYSTEROSCOPY with resection ;  Surgeon: Olga Millers, MD;  Location: [facility];  Service: Gynecology;  Laterality: N/A;   LAPAROTOMY     PORTACATH PLACEMENT N/A 09/13/2020   Procedure: INSERTION PORT-A-CATH WITH ULTRASOUND GUIDANCE;  Surgeon: Rolm Bookbinder, MD;  Location: [facility];  Service: General;  Laterality: N/A;    FAMILY HISTORY:  We obtained a detailed, 4-generation family history.  Significant diagnoses are listed below: Family  History  Problem Relation Age of Onset   Heart attack Mother    Rheum arthritis Father    Hemachromatosis Father    Heart Problems Father    Cancer - Other Sister        Ewing sarcoma; d. 33   Lung cancer Maternal Aunt        smoking hx; 63s   Cancer Paternal Grandmother 93       unknown type    Ms. Dugo is unaware of previous family history of genetic testing for hereditary cancer risks. There is no reported Ashkenazi Jewish ancestry. There is no known consanguinity.  GENETIC COUNSELING ASSESSMENT: Ms. Mussen is 57 y.o. female with a personal history that is somewhat suggestive of a hereditary cancer syndrome given her history of metastatic breast cancer with a potential germline finding in the ATM gene by somatic testing. We, therefore, discussed and recommended the following at today's visit.   DISCUSSION: We discussed that 5 - 10% of cancer is hereditary.  Most cases of hereditary breast cancer are associated with mutations in BRCA1/2.  There are other genes that can be associated with hereditary breast cancer syndromes.  Given the potential germline finding in ATM, we discussed breast, ovarian, and pancreatic cancer risks associated with ATM gene mutations as well as management strategies.  We discussed that if this mutation is found in her germline, there is implications for family members.   TWe discussed that testing is beneficial for several reasons including knowing how to follow individuals for their cancer risks and understanding if other family members could be at risk for cancer and allowing them to undergo genetic testing.   We reviewed the characteristics, features and inheritance patterns of hereditary cancer syndromes. We also discussed genetic testing, including the appropriate family members to test, the process of testing, insurance coverage and turn-around-time for results. We discussed the implications of a negative, positive, carrier and/or variant of uncertain  significant result. We recommended Ms. An pursue genetic testing for a panel that includes genes associated with breast and other cancers.   The Multi-Cancer + RNA Panel offered by [facility] includes sequencing and/or deletion/duplication analysis of the following 70 genes:  AIP*, ALK, APC*, ATM*, AXIN2*, BAP1*, BARD1*, BLM*, BMPR1A*, BRCA1*, BRCA2*, BRIP1*, CDC73*, CDH1*, CDK4, CDKN1B*, CDKN2A, CHEK2*, CTNNA1*, DICER1*, EPCAM (del/dup only), EGFR, FH*, FLCN*, GREM1 (promoter dup only), HOXB13, KIT, LZTR1, MAX*, MBD4, MEN1*, MET, MITF, MLH1*, MSH2*, MSH3*, MSH6*, MUTYH*, NF1*, NF2*, NTHL1*, PALB2*, PDGFRA, PMS2*, POLD1*, POLE*, POT1*, PRKAR1A*, PTCH1*, PTEN*, RAD51C*, RAD51D*, RB1*, RET, SDHA* (sequencing only), SDHAF2*, SDHB*, SDHC*, SDHD*, SMAD4*, SMARCA4*, SMARCB1*, SMARCE1*, STK11*, SUFU*, TMEM127*, TP53*, TSC1*, TSC2*, VHL*. RNA analysis is performed for * genes.  Based on Ms. Brucker's personal history of metastatic breast cancer and potential germline ATM finding, she meets medical criteria for genetic testing. Despite that she meets criteria, she may still have an out of pocket cost. We discussed that if her out of pocket cost for testing is over $100,  the laboratory should contact her and discuss the self-pay prices and/or patient pay assistance programs.    PLAN: After considering the risks, benefits, and limitations, Ms. Antell provided informed consent to pursue genetic testing and the blood sample was sent to [facility] for analysis of the Multi-Cancer +RNA Panel. Results should be available within approximately 3 weeks' time, at which point they will be disclosed by telephone to Ms. Cleaves, as will any additional recommendations warranted by these results. Ms. Muthig will receive a summary of her genetic counseling visit and a copy of her results once available. This information will also be available in Epic.   Ms. Brignola questions were answered to her satisfaction today. Our  contact information was provided should additional questions or concerns arise. Thank you for the referral and allowing [place] to share in the care of your patient.   Laila Myhre M. Joette Catching, [place], [facility] Genetic Counselor [email or web address] (P) [phone]  The patient was seen for a total of 40 minutes in face-to-face genetic counseling.  The patient was seen alone.  Drs. Lindi Adie and/or Burr Medico were available to discuss this case as needed.    _______________________________________________________________________ For Office Staff:  Number of people involved in session: 1 Was an Intern/ student involved with case: yes; [employer] GC student, [employer], completed contracting and pedigree

## 2023-01-27 NOTE — Progress Notes (Unsigned)
Office Visit Note  Patient: Erica Wood             Date of Birth: 08-Nov-1966           MRN: [id number]             PCP: Nicholas Lose, MD Referring: Eunice Blase, MD Visit Date: 01/30/2023 Occupation: @GUAROCC @  Subjective:  Pain in multiple joints   History of Present Illness: Erica Wood is a 57 y.o. female with history of systemic lupus and inflammatory arthritis.  Patient is taking plaquenil 200 mg 1 tablet by mouth daily.  She is tolerating Plaquenil without any side effects and has not missed any doses recently.  She denies any signs or symptoms of a systemic lupus flare.  She continues to have ongoing pain and intermittent swelling in both hands and the left knee joint.  She has not been taking any over-the-counter products for pain relief.  She has not found Voltaren gel to be effective in the past.  She continues to have intermittent symptoms of Raynaud's phenomenon in her fingertips.  She denies any digital ulcers.  She has not had any oral or nasal ulcerations.  She continues to have significant fatigue but attributes it to the treatment she is undergoing for breast cancer.  She continues to follow-up closely with Dr. Lindi Adie and has had frequent lab monitoring.   Activities of Daily Living:  Patient reports morning stiffness for 5-10 minutes.   Patient Denies nocturnal pain.  Difficulty dressing/grooming: Denies Difficulty climbing stairs: Reports Difficulty getting out of chair: Denies Difficulty using hands for taps, buttons, cutlery, and/or writing: Reports  Review of Systems  Constitutional:  Positive for fatigue.  HENT:  Negative for mouth sores and mouth dryness.   Eyes:  Positive for dryness.  Respiratory:  Negative for shortness of breath.   Cardiovascular:  Negative for chest pain and palpitations.  Gastrointestinal:  Positive for constipation. Negative for blood in stool and diarrhea.  Endocrine: Negative for increased urination.   Genitourinary:  Negative for involuntary urination.  Musculoskeletal:  Positive for joint pain, joint pain, joint swelling, myalgias, muscle weakness, morning stiffness, muscle tenderness and myalgias. Negative for gait problem.  Skin:  Negative for color change, rash, hair loss and sensitivity to sunlight.  Allergic/Immunologic: Negative for susceptible to infections.  Neurological:  Negative for dizziness and headaches.  Hematological:  Negative for swollen glands.  Psychiatric/Behavioral:  Positive for sleep disturbance. Negative for depressed mood. The patient is nervous/anxious.     PMFS History:  Patient Active Problem List   Diagnosis Date Noted   Goals of care, counseling/discussion 05/09/2021   Malignant neoplasm metastatic to liver ([place]) 01/03/2021   Port-A-Cath in place 11/02/2020   Aortic atherosclerosis ([place]) 09/21/2020   Malignant neoplasm of lower-outer quadrant of right breast of female, estrogen receptor positive ([place]) 08/10/2020   Abnormal cervical Papanicolaou smear 07/12/2020   Pulmonary embolism ([place]) 07/12/2020   Fibrocystic disease of breast 01/22/2018   Primary osteoarthritis of both knees 05/02/2017   Raynaud's disease without gangrene 11/26/2016   Discoid lupus 11/26/2016   Anticardiolipin antibody positive 11/26/2016   Autoimmune disease ([place]) 11/25/2016   High risk medication use 11/25/2016   Primary osteoarthritis of both hands 11/25/2016   History of DVT (deep vein thrombosis) 11/25/2016   History of pulmonary embolism 11/25/2016   Primary insomnia 11/25/2016   Anxiety 11/25/2016   Irritable bowel syndrome with constipation 11/25/2016   Raynaud's phenomenon 01/03/2016   Varicose veins of lower  extremities with other complications [id number]   Spider veins of both lower extremities 05/24/2014    Past Medical History:  Diagnosis Date   Anti-cardiolipin antibody positive    Anti-cardiolipin antibody syndrome (HCC)    Anxiety    Arthralgia     Arthritis    bil knees, hands   Atypical chest pain    Autoimmune disease ([place])    Autoimmune disease ([place]) 11/25/2016   Breast cancer metastasized to liver ([place]) 09/2020   Cancer ([place]) 07/2020   right breast IDC   Chronic fatigue    Depression    DVT (deep venous thrombosis) (HCC)    IBS (irritable bowel syndrome)    with constipation   Lupus (HCC)    Lymphedema    bilateral legs per patient   Pulmonary embolus (HCC)    Raynaud's syndrome    Sicca ([place])    Thrombocytopenia ([place])     Family History  Problem Relation Age of Onset   Heart attack Mother    Rheum arthritis Father    Hemachromatosis Father    Heart Problems Father    Cancer - Other Sister        Ewing sarcoma; d. 54   Lung cancer Maternal Aunt        smoking hx; 95s   Cancer Paternal Grandmother 93       unknown type   Colon cancer Neg Hx    Stomach cancer Neg Hx    Esophageal cancer Neg Hx    Pancreatic cancer Neg Hx    Liver disease Neg Hx    Past Surgical History:  Procedure Laterality Date   ABLATION  12/2018   leg veins   ABLATION Right 08/2019   venous leg    APPENDECTOMY     COLON SURGERY     Perforated colon repair after colostomy   EYE SURGERY Bilateral 2022   GANGLION CYST EXCISION     HYSTEROSCOPY WITH D & C N/A 01/13/2014   Procedure: DILATATION AND CURETTAGE /HYSTEROSCOPY with resection ;  Surgeon: Olga Millers, MD;  Location: [facility];  Service: Gynecology;  Laterality: N/A;   LAPAROTOMY     PORTACATH PLACEMENT N/A 09/13/2020   Procedure: INSERTION PORT-A-CATH WITH ULTRASOUND GUIDANCE;  Surgeon: Rolm Bookbinder, MD;  Location: [facility];  Service: General;  Laterality: N/A;   Social History   Social History Narrative   Not on file   Immunization History  Administered Date(s) Administered   PFIZER Comirnaty(Gray Top)Covid-19 Tri-Sucrose Vaccine 08/11/2020     Objective: Vital Signs: BP 109/74 (BP Location: Left Arm, Patient Position: Sitting, Cuff Size:  Normal)   Pulse 62   Resp 12   Ht 5\' 4"  (1.626 m)   Wt 111 lb (50.3 kg)   BMI 19.05 kg/m    Physical Exam Vitals and nursing note reviewed.  Constitutional:      Appearance: She is well-developed.  HENT:     Head: Normocephalic and atraumatic.  Eyes:     Conjunctiva/sclera: Conjunctivae normal.  Cardiovascular:     Rate and Rhythm: Normal rate and regular rhythm.     Heart sounds: Normal heart sounds.  Pulmonary:     Effort: Pulmonary effort is normal.     Breath sounds: Normal breath sounds.  Abdominal:     General: Bowel sounds are normal.     Palpations: Abdomen is soft.  Musculoskeletal:     Cervical back: Normal range of motion.  Lymphadenopathy:     Cervical: No  cervical adenopathy.  Skin:    General: Skin is warm and dry.     Capillary Refill: Capillary refill takes less than 2 seconds.  Neurological:     Mental Status: She is alert and oriented to person, place, and time.  Psychiatric:        Behavior: Behavior normal.      Musculoskeletal Exam: C-spine has good range of motion.  Discomfort with range of motion of the lumbar spine.  Shoulder joints, elbow joints, wrist joints have good range of motion.  Thickening and tenderness of the right CMC joint.  Thickening of bilateral second and third MCP joints.  Synovitis of the left second MCP joint noted.  PIP and DIP thickening consistent with osteoarthritis of both hands.  Hip joints have good range of motion with no groin pain.  No tenderness over the trochanteric bursa at this time.  Knee joints have good range of motion with discomfort and warmth in the left knee.  Ankle joints have good range of motion with no tenderness or synovitis.  CDAI Exam: CDAI Score: -- Patient Global: --; Provider Global: -- Swollen: --; Tender: -- Joint Exam 01/30/2023   No joint exam has been documented for this visit   There is currently no information documented on the homunculus. Go to the Rheumatology activity and complete the  homunculus joint exam.  Investigation: No additional findings.  Imaging: No results found.  Recent Labs: Lab Results  Component Value Date   WBC 6.7 01/28/2023   HGB 12.9 01/28/2023   PLT 142 (L) 01/28/2023   NA 137 01/28/2023   K 4.1 01/28/2023   CL 103 01/28/2023   CO2 31 01/28/2023   GLUCOSE 94 01/28/2023   BUN 19 01/28/2023   CREATININE 0.96 01/28/2023   BILITOT 0.5 01/28/2023   ALKPHOS 112 01/28/2023   AST 27 01/28/2023   ALT 21 01/28/2023   PROT 6.3 (L) 01/28/2023   ALBUMIN 4.0 01/28/2023   CALCIUM 9.0 01/28/2023   GFRAA 95 07/28/2020   QFTBGOLDPLUS Negative 12/19/2021    Speciality Comments: PLQ Eye Exam: 04/02/2022 WNL @ [facility] follow up in 1 year. stage IB invasive ductal carcinoma, grade 3 08/16/20  Procedures:  No procedures performed Allergies: Beef-derived products, Latex, Sulfa antibiotics, and Tape   She remains under the care of Dr. Lindi Adie.     Assessment / Plan:     Visit Diagnoses: SLE (systemic lupus erythematosus related syndrome) (HCC) - Positive ANA and positive double-stranded DNA, hypocomplementemia, inflammatory arthritis, raynauds phenomenon and discoid lupus: She remains on Plaquenil 200 mg 1 tablet by mouth daily.  She is tolerating Plaquenil without any side effects.  She continues to symptomatic from inflammatory arthritis involving both hands and her left knee joint.  She has not been taking any over-the-counter products for pain relief since they have been ineffective in the past.  She has been apprehensive to make any medication changes due to remaining under the care of Dr. Lindi Adie for management of breast cancer.  She is not currently experiencing any other signs or symptoms of a systemic lupus flare. Lab work from 07/01/22 was reviewed today in the office: protein creatinine ratio WNL, ESR WNL, dsDNA negative, ANA negative, and complements WNL.  Labs were not consistent with active lupus or a flare.  Future orders for the  following lab work were placed today for [employer].  She will notify us when she plans on going for lab work and we will release these orders.  She will  remain on Plaquenil as prescribed.  She was advised to notify us if she develops signs or symptoms of a flare.  She will follow-up in the office in 5 months or sooner if needed.  - Plan: Anti-DNA antibody, double-stranded, C3 and C4, Sedimentation rate, ANA, Protein / creatinine ratio, urine  Chronic inflammatory arthritis - RF negative: Patient presents today with synovitis of the left second MCP joint.  She has warmth of the left knee but no effusion noted today.  She remains on Plaquenil 200 mg 1 tablet by mouth daily.  She continues to tolerate Plaquenil without any side effects.  She has not been taking any over-the-counter products for symptomatic relief.  She is apprehensive to add any medications at this time.  She will notify us when and if she would like to discuss other treatment options.  She remains under the care of Dr. Payton Mccallum for management of breast cancer and has had to have several medication changes as well as frequent lab monitoring.  High risk medication use - Plaquenil 200 mg 1 tablet by mouth daily.CBC and CMP updated on 01/28/23.  PLQ Eye Exam: 04/02/2022 WNL @ [facility] follow up in 1 year.   Discoid lupus: No discoid lesions.  No recent flares.  Anticardiolipin antibody positive - Anticardiolipin antibodies were initially positive but were negative with repeat testing.  Patient remains on warfarin.  Malignant neoplasm of lower-outer quadrant of right breast of female, estrogen receptor positive ([place]): She remains under the care of Dr. Alinda Deem telephone visit from 01/01/23.  She is scheduled for an upcoming appointment tomorrow to discuss treatment options.   Raynaud's disease without gangrene: She continues to experience intermittent symptoms of Raynaud's phenomenon.  Primary osteoarthritis of both hands:  PIP and DIP thickening consistent with osteoarthritis of both hands.  Prominence and thickening over the right CMC joint.  Tenderness over the right CMC joint noted.  Treatment options for management of osteoarthritis were discussed today in detail.  Discussed the importance of joint protection and muscle strengthening.  Patient was given a prescription for a right CMC joint brace.  Also discussed the use of arthritis compression gloves.  Discussed the use of Voltaren gel or Salonpas patch over the [facility] joint for relief.  She was given a jar gripper to assist her.  Primary osteoarthritis of both knees - S/p visco in the past.  MRI of the right knee on 11/14/2021.  She initiated physical therapy and had temporary relief but discontinued with the new year.  She will notify us when and if she would like a new referral to physical therapy placed. She continues to have ongoing pain in both knee joints, left greater than right.  She has painful range of motion of the left knee with warmth on examination today.  No effusion noted.  Primary insomnia: She has difficulty sleeping at night due to nocturnal pain at times.  She continues to take tramadol 50 mg 1 to 2 tablets every 6 hours as needed for pain relief.  Vitamin D deficiency: She continues to take vitamin D 3000 units daily.  Other osteoporosis without current pathological fracture - DEXA updated on 05/10/2021: AP spine BMD 0.761 with T score -2.6.  She has been taking vitamin D 3000 units daily. Due to update DEXA in June 2024.  Other medical conditions are listed as follows:  History of pulmonary embolism  History of DVT (deep vein thrombosis)  History of IBS  History of anxiety  Port-A-Cath in place  Family history of hemochromatosis  Orders: Orders Placed This Encounter  Procedures   Anti-DNA antibody, double-stranded   C3 and C4   Sedimentation rate   ANA   Protein / creatinine ratio, urine   No orders of the defined types were  placed in this encounter.    Follow-Up Instructions: Return in about 5 months (around 07/02/2023) for Systemic lupus erythematosus.   Ofilia Neas, PA-C  Note - This record has been created using Dragon software.  Chart creation errors have been sought, but may not always  have been located. Such creation errors do not reflect on  the standard of medical care.

## 2023-01-28 ENCOUNTER — Inpatient Hospital Stay: Payer: 59 | Admitting: Hematology and Oncology

## 2023-01-28 ENCOUNTER — Inpatient Hospital Stay: Payer: 59

## 2023-01-28 ENCOUNTER — Other Ambulatory Visit: Payer: Self-pay

## 2023-01-28 DIAGNOSIS — C787 Secondary malignant neoplasm of liver and intrahepatic bile duct: Secondary | ICD-10-CM | POA: Diagnosis not present

## 2023-01-28 DIAGNOSIS — Z170 Estrogen receptor positive status [ER+]: Secondary | ICD-10-CM | POA: Diagnosis not present

## 2023-01-28 DIAGNOSIS — C50511 Malignant neoplasm of lower-outer quadrant of right female breast: Secondary | ICD-10-CM

## 2023-01-28 DIAGNOSIS — R5382 Chronic fatigue, unspecified: Secondary | ICD-10-CM | POA: Diagnosis not present

## 2023-01-28 DIAGNOSIS — Z8616 Personal history of COVID-19: Secondary | ICD-10-CM | POA: Diagnosis not present

## 2023-01-28 DIAGNOSIS — Z86718 Personal history of other venous thrombosis and embolism: Secondary | ICD-10-CM

## 2023-01-28 LAB — CBC WITH DIFFERENTIAL (CANCER CENTER ONLY)
Abs Immature Granulocytes: 0.01 10*3/uL (ref 0.00–0.07)
Basophils Absolute: 0.1 10*3/uL (ref 0.0–0.1)
Basophils Relative: 1 %
Eosinophils Absolute: 0 10*3/uL (ref 0.0–0.5)
Eosinophils Relative: 1 %
HCT: 37.5 % (ref 36.0–46.0)
Hemoglobin: 12.9 g/dL (ref 12.0–15.0)
Immature Granulocytes: 0 %
Lymphocytes Relative: 21 %
Lymphs Abs: 1.4 10*3/uL (ref 0.7–4.0)
MCH: 33.7 pg (ref 26.0–34.0)
MCHC: 34.4 g/dL (ref 30.0–36.0)
MCV: 97.9 fL (ref 80.0–100.0)
Monocytes Absolute: 0.6 10*3/uL (ref 0.1–1.0)
Monocytes Relative: 9 %
Neutro Abs: 4.6 10*3/uL (ref 1.7–7.7)
Neutrophils Relative %: 68 %
Platelet Count: 142 10*3/uL — ABNORMAL LOW (ref 150–400)
RBC: 3.83 MIL/uL — ABNORMAL LOW (ref 3.87–5.11)
RDW: 13.5 % (ref 11.5–15.5)
WBC Count: 6.7 10*3/uL (ref 4.0–10.5)
nRBC: 0 % (ref 0.0–0.2)

## 2023-01-28 LAB — PROTIME-INR
INR: 1.6 — ABNORMAL HIGH (ref 0.8–1.2)
Prothrombin Time: 19.1 seconds — ABNORMAL HIGH (ref 11.4–15.2)

## 2023-01-28 LAB — CMP (CANCER CENTER ONLY)
ALT: 21 U/L (ref 0–44)
AST: 27 U/L (ref 15–41)
Albumin: 4 g/dL (ref 3.5–5.0)
Alkaline Phosphatase: 112 U/L (ref 38–126)
Anion gap: 3 — ABNORMAL LOW (ref 5–15)
BUN: 19 mg/dL (ref 6–20)
CO2: 31 mmol/L (ref 22–32)
Calcium: 9 mg/dL (ref 8.9–10.3)
Chloride: 103 mmol/L (ref 98–111)
Creatinine: 0.96 mg/dL (ref 0.44–1.00)
GFR, Estimated: >60 mL/min (ref >60)
Glucose, Bld: 94 mg/dL (ref 70–99)
Potassium: 4.1 mmol/L (ref 3.5–5.1)
Sodium: 137 mmol/L (ref 135–145)
Total Bilirubin: 0.5 mg/dL (ref 0.3–1.2)
Total Protein: 6.3 g/dL — ABNORMAL LOW (ref 6.5–8.1)

## 2023-01-29 ENCOUNTER — Inpatient Hospital Stay: Payer: 59 | Admitting: Pharmacist

## 2023-01-29 ENCOUNTER — Inpatient Hospital Stay: Payer: 59

## 2023-01-29 LAB — CANCER ANTIGEN 27.29: CA 27.29: 1159.3 U/mL — ABNORMAL HIGH (ref 0.0–38.6)

## 2023-01-30 ENCOUNTER — Inpatient Hospital Stay: Payer: 59 | Admitting: Hematology and Oncology

## 2023-01-30 ENCOUNTER — Ambulatory Visit: Payer: 59 | Attending: Rheumatology | Admitting: Physician Assistant

## 2023-01-30 ENCOUNTER — Ambulatory Visit: Payer: 59 | Admitting: Rheumatology

## 2023-01-30 ENCOUNTER — Encounter: Payer: Self-pay | Admitting: Physician Assistant

## 2023-01-30 VITALS — BP 109/74 | HR 62 | Resp 12 | Ht 64.0 in | Wt 111.0 lb

## 2023-01-30 DIAGNOSIS — Z8349 Family history of other endocrine, nutritional and metabolic diseases: Secondary | ICD-10-CM

## 2023-01-30 DIAGNOSIS — M329 Systemic lupus erythematosus, unspecified: Secondary | ICD-10-CM | POA: Diagnosis not present

## 2023-01-30 DIAGNOSIS — Z95828 Presence of other vascular implants and grafts: Secondary | ICD-10-CM

## 2023-01-30 DIAGNOSIS — M19042 Primary osteoarthritis, left hand: Secondary | ICD-10-CM

## 2023-01-30 DIAGNOSIS — Z8719 Personal history of other diseases of the digestive system: Secondary | ICD-10-CM

## 2023-01-30 DIAGNOSIS — L930 Discoid lupus erythematosus: Secondary | ICD-10-CM

## 2023-01-30 DIAGNOSIS — F5101 Primary insomnia: Secondary | ICD-10-CM

## 2023-01-30 DIAGNOSIS — I7300 Raynaud's syndrome without gangrene: Secondary | ICD-10-CM

## 2023-01-30 DIAGNOSIS — R760 Raised antibody titer: Secondary | ICD-10-CM | POA: Diagnosis not present

## 2023-01-30 DIAGNOSIS — Z170 Estrogen receptor positive status [ER+]: Secondary | ICD-10-CM

## 2023-01-30 DIAGNOSIS — E559 Vitamin D deficiency, unspecified: Secondary | ICD-10-CM

## 2023-01-30 DIAGNOSIS — Z86718 Personal history of other venous thrombosis and embolism: Secondary | ICD-10-CM

## 2023-01-30 DIAGNOSIS — C50511 Malignant neoplasm of lower-outer quadrant of right female breast: Secondary | ICD-10-CM

## 2023-01-30 DIAGNOSIS — M1990 Unspecified osteoarthritis, unspecified site: Secondary | ICD-10-CM

## 2023-01-30 DIAGNOSIS — Z86711 Personal history of pulmonary embolism: Secondary | ICD-10-CM

## 2023-01-30 DIAGNOSIS — M818 Other osteoporosis without current pathological fracture: Secondary | ICD-10-CM

## 2023-01-30 DIAGNOSIS — Z79899 Other long term (current) drug therapy: Secondary | ICD-10-CM | POA: Diagnosis not present

## 2023-01-30 DIAGNOSIS — M170 Bilateral primary osteoarthritis of knee: Secondary | ICD-10-CM

## 2023-01-30 DIAGNOSIS — Z8659 Personal history of other mental and behavioral disorders: Secondary | ICD-10-CM

## 2023-01-30 DIAGNOSIS — M19041 Primary osteoarthritis, right hand: Secondary | ICD-10-CM

## 2023-01-30 NOTE — Progress Notes (Signed)
HEMATOLOGY-ONCOLOGY TELEPHONE VISIT PROGRESS NOTE  I connected with our patient on 01/31/23 at 11:00 AM EDT by telephone and verified that I am speaking with the correct person using two identifiers.  I discussed the limitations, risks, security and privacy concerns of performing an evaluation and management service by telephone and the availability of in person appointments.  I also discussed with the patient that there may be a patient responsible charge related to this service. The patient expressed understanding and agreed to proceed.   History of Present Illness:  Erica Wood is a 57 y.o. with above-mentioned history of  Metastatic breast cancer who progressed on who is currently on olaparib. She presents to the clinic for a telephone follow-up to review labs.   Oncology History  Malignant neoplasm of lower-outer quadrant of right breast of female, estrogen receptor positive ([place])  08/04/2020 Initial Diagnosis   Right breast lower outer quadrant biopsy 08/04/2020 for a clinical T2 N0 M1, stage IV invasive ductal carcinoma, grade 3, triple positive, with an MIB-1 of 40%. abdominal MRI 08/24/2020 shows multiple liver lesions highly suspicious for liver metastases. Liver biopsy 09/20/2020 positive for carcinoma, prognostic panel estrogen and progesterone receptor positive, but HER-2 negative (0); MIB-1 of 30%   08/31/2020 - 12/14/2020 Chemotherapy   Neoadjuvant chemotherapy consisting of docetaxel, carboplatin, trastuzumab and pertuzumab every 21 days x 6    12/26/2020 Imaging   breast MRI 12/26/2020 showed right breast index lesion decreased to 0.8 cm; 2 additional masses in the right breast stable abdominal MRI 01/29/2021 shows no active disease in the liver    Chemotherapy   Herceptin Maintenance initially q 3 weeks, now q 4 weeks   01/29/2021 -  Anti-estrogen oral therapy   anastrozole and palbociclib    Malignant neoplasm metastatic to liver ([place])  01/03/2021 Initial Diagnosis    Liver metastases ([place])   01/03/2021 - 10/16/2022 Chemotherapy   Patient is on Treatment Plan : BREAST Trastuzumab q21d       REVIEW OF SYSTEMS:   Constitutional: Denies fevers, chills or abnormal weight loss All other systems were reviewed with the patient and are negative. Observations/Objective:     Assessment Plan:  Malignant neoplasm of lower-outer quadrant of right breast of female, estrogen receptor positive ([place]) 08/04/2020: Right breast biopsy T2 N0 M1 stage IV IDC grade 3, triple positive with a Ki-67 of 40%, multiple liver lesions biopsy was positive for breast cancer ER/PR positive HER2 negative with a Ki-67 30% 08/31/2020-12/14/2020: TCHP x6  Letrozole and palbociclib (started 01/29/2021)   COVID infection January 2023: Held [facility] and Herceptin treatments.  Resumed 02/06/2022   01/17/2022: CT CAP: Very subtle metastatic lesion of the caudate lobe is unchanged.  No new metastatic disease elsewhere.  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  Will refer the patient to genetic testing   Current treatment: Olaparib started 11/29/2022 (200 mg p.o. twice daily) dose increased to 300 p.o. twice daily 01/01/2023   Treatment toxicities: Fatigue   Lab review: CA 27-29: [id number] (was 1049 and previously it was 385 on 10/16/2022) Based on the above result I recommended that we increase the dosage of olaparib to 300 mg p.o. twice daily   We are going to recheck her labs and a PET CT scan in a month and a follow-up after that to  discuss results. I will discuss with her about participating in clinical trial [place] 1 if there is clear evidence of progression.  I discussed the assessment and treatment plan with the patient. The patient was provided an  opportunity to ask questions and all were answered. The patient agreed with the plan and demonstrated an understanding of the instructions. The patient was advised to call back or seek an in-person evaluation if the symptoms worsen or if the condition fails to improve as anticipated.   I provided 12 minutes of non-face-to-face time during this encounter.  This includes time for charting and coordination of care   Harriette Ohara, MD  I Gardiner Coins am acting as a scribe for Dr.Dreux Mcgroarty  I have reviewed the above documentation for accuracy and completeness, and I agree with the above.

## 2023-01-31 ENCOUNTER — Inpatient Hospital Stay (HOSPITAL_BASED_OUTPATIENT_CLINIC_OR_DEPARTMENT_OTHER): Payer: 59 | Admitting: Hematology and Oncology

## 2023-01-31 DIAGNOSIS — C50511 Malignant neoplasm of lower-outer quadrant of right female breast: Secondary | ICD-10-CM

## 2023-01-31 DIAGNOSIS — Z170 Estrogen receptor positive status [ER+]: Secondary | ICD-10-CM

## 2023-01-31 DIAGNOSIS — C787 Secondary malignant neoplasm of liver and intrahepatic bile duct: Secondary | ICD-10-CM

## 2023-01-31 NOTE — Assessment & Plan Note (Signed)
08/04/2020: Right breast biopsy T2 N0 M1 stage IV IDC grade 3, triple positive with a Ki-67 of 40%, multiple liver lesions biopsy was positive for breast cancer ER/PR positive HER2 negative with a Ki-67 30% 08/31/2020-12/14/2020: TCHP x6  Letrozole and palbociclib (started 01/29/2021)   COVID infection January 2023: Held [facility] and Herceptin treatments.  Resumed 02/06/2022   01/17/2022: CT CAP: Very subtle metastatic lesion of the caudate lobe is unchanged.  No new metastatic disease elsewhere.  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  Will refer the patient to genetic testing   Current treatment: Olaparib started 11/29/2022 (200 mg p.o. twice daily) dose increased to 300 p.o. twice daily 01/01/2023   Treatment toxicities: Denies any major adverse effects to olaparib.   Lab review: CA 27-29: [id number] (was 1049 and previously it was 385 on 10/16/2022) Based on the above result I recommended that we increase the dosage of olaparib to 300 mg p.o. twice daily She will start taking 3 tablets in the morning and 3 tablets in the evening that supply that she has at home.  I sent a new prescription to her specialty pharmacy to increase the dosage to 300 mg twice a day.   We are going to recheck her labs in a month and a telephone visit after that to discuss results.

## 2023-02-04 ENCOUNTER — Encounter: Payer: Self-pay | Admitting: Genetic Counselor

## 2023-02-04 ENCOUNTER — Telehealth: Payer: Self-pay | Admitting: Genetic Counselor

## 2023-02-04 DIAGNOSIS — Z1379 Encounter for other screening for genetic and chromosomal anomalies: Secondary | ICD-10-CM | POA: Insufficient documentation

## 2023-02-04 DIAGNOSIS — Z1509 Genetic susceptibility to other malignant neoplasm: Secondary | ICD-10-CM

## 2023-02-04 HISTORY — DX: Genetic susceptibility to other malignant neoplasm: Z15.09

## 2023-02-04 NOTE — Telephone Encounter (Signed)
Disclosed ATM gene mutation.  Discussed cancer risks and family implications.

## 2023-02-05 ENCOUNTER — Telehealth: Payer: Self-pay | Admitting: Hematology and Oncology

## 2023-02-05 ENCOUNTER — Ambulatory Visit: Payer: Self-pay | Admitting: Genetic Counselor

## 2023-02-05 DIAGNOSIS — Z1509 Genetic susceptibility to other malignant neoplasm: Secondary | ICD-10-CM

## 2023-02-05 DIAGNOSIS — Z1379 Encounter for other screening for genetic and chromosomal anomalies: Secondary | ICD-10-CM

## 2023-02-05 DIAGNOSIS — C50511 Malignant neoplasm of lower-outer quadrant of right female breast: Secondary | ICD-10-CM

## 2023-02-05 NOTE — Telephone Encounter (Signed)
Scheduled appointment per los. Patient is aware of the made appointments. 

## 2023-02-06 ENCOUNTER — Other Ambulatory Visit: Payer: Self-pay | Admitting: *Deleted

## 2023-02-06 MED ORDER — AMPHETAMINE-DEXTROAMPHET ER 20 MG PO CP24: 20.0000 mg | ORAL_CAPSULE | Freq: Every day | ORAL | 0 refills | Status: DC

## 2023-02-06 NOTE — Telephone Encounter (Signed)
Last Fill: 01/08/2023  Next Visit: 07/02/2023  Last Visit: 01/30/2023  Current Dose per office note on 01/30/2023: not discussed  Okay to refill Adderall?

## 2023-02-15 ENCOUNTER — Other Ambulatory Visit: Payer: Self-pay | Admitting: Rheumatology

## 2023-02-17 NOTE — Telephone Encounter (Signed)
Last Fill: 11/20/2022  Eye exam: 04/02/2022 WNL    Labs: 01/28/2023 Total Protein 6.3, Anion Gap 3, RBC 3.83, Platelet Count 142  Next Visit: 07/02/2023  Last Visit: 01/30/2023  DX:SLE (systemic lupus erythematosus related syndrome)   Current Dose per office note 01/30/2023: Plaquenil 200 mg 1 tablet by mouth daily.   Okay to refill Plaquenil?

## 2023-02-19 ENCOUNTER — Encounter: Payer: Self-pay | Admitting: Genetic Counselor

## 2023-02-19 ENCOUNTER — Encounter: Payer: Self-pay | Admitting: Hematology and Oncology

## 2023-02-19 NOTE — Progress Notes (Signed)
GENETIC TEST RESULTS  Patient Name: Erica Wood Patient Age: 57 y.o. Encounter Date: 02/05/2023  Referring Provider: Serena Croissant, MD   Erica Wood was seen in the Cancer Genetics clinic on January 13, 2023 due to a personal history of breast cancer cancer and concern regarding a hereditary predisposition to cancer in the family.   In 2021, at the age of 42, Erica Wood was diagnosed with invasive ductal carcinoma (ER+/PR+/HER2+) with metastases to liver.  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      FAMILY HISTORY:  We obtained a detailed, 4-generation family history.  Significant diagnoses are listed below:      Family History  Problem Relation Age of Onset   Heart attack Mother     Rheum arthritis Father     Hemachromatosis Father     Heart Problems Father     Cancer - Other Sister          Ewing sarcoma; d. 25   Lung cancer Maternal Aunt          smoking hx; 59s   Cancer Paternal Grandmother 53        unknown type     Erica Wood is unaware of previous family history of genetic testing for hereditary cancer risks. There is no reported Ashkenazi Jewish ancestry. There is no known consanguinity.    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 Specifically, this variant is c.1158del (p.Lys387Argfs*3).  No other pathogenic variants were detected in [facility] Multi-Cancer +RNA Panel.  The Multi-Cancer + RNA Panel offered by [facility] includes sequencing and/or deletion/duplication analysis of the following 70 genes:  AIP*, ALK, APC*, ATM*, AXIN2*, BAP1*, BARD1*, BLM*, BMPR1A*, BRCA1*, BRCA2*, BRIP1*, CDC73*, CDH1*, CDK4, CDKN1B*, CDKN2A, CHEK2*, CTNNA1*, DICER1*, EPCAM (del/dup only), EGFR, FH*, FLCN*, GREM1 (promoter dup only), HOXB13, KIT, LZTR1, MAX*, MBD4, MEN1*, MET, MITF, MLH1*, MSH2*, MSH3*, MSH6*, MUTYH*, NF1*, NF2*, NTHL1*, PALB2*,  PDGFRA, PMS2*, POLD1*, POLE*, POT1*, PRKAR1A*, PTCH1*, PTEN*, RAD51C*, RAD51D*, RB1*, RET, SDHA* (sequencing only), SDHAF2*, SDHB*, SDHC*, SDHD*, SMAD4*, SMARCA4*, SMARCB1*, SMARCE1*, STK11*, SUFU*, TMEM127*, TP53*, TSC1*, TSC2*, VHL*. RNA analysis is performed for * genes.   The test report has been scanned into EPIC and is located under the Molecular Pathology section of the Results Review tab.  A portion of the result report is included below for reference. Genetic testing reported out on January 29, 2023.     Cancer Risks for ATM: Females have a 20-30% lifetime risk of breast cancer. 2-3% risk for epithelial ovarian cancer 5-10% risk for pancreatic cancer  There is emerging evidence suggesting an increased risk for prostate cancer.  Research is continuing to help learn more about the cancers associated with ATM pathogenic variants and what the exact risks are to develop these cancers.   Management Recommendations:  Breast Screening/Risk Reduction: Breast cancer screening includes: Breast awareness beginning at age 63 Monthly self-breast examination beginning at age 22 Clinical breast examination every 6-12 months beginning at age 100 or at the age of the earliest diagnosed breast cancer in the family, if onset was before age 51 Annual mammogram with consideration of tomosynthesis starting at age 55 or 10 years prior to the youngest age of diagnosis, whichever comes first Consider breast MRI with and without contrast starting at age 57-35 Evidence is insufficient for a prophylactic risk-reducing mastectomy, manage based on family history  For patients who are  treated for breast cancer and have not had bilateral mastectomy, screening should continue as described  Ovarian Cancer Screening/Risk Reduction: Evidence insufficient for risk-reducing salpingo oophorectomy; manage based on family history Seek prompt evaluation with onset of signs/symptoms related to ovarian cancer If there is a  family history of ovarian cancer, have a discussion with your physician about the benefits and limitations of screening and risk reducing strategies  Pancreatic Cancer Screening/Risk Reduction: Avoid smoking, heavy alcohol use, and obesity. Pancreatic cancer screening may be considered in those with a family history of pancreatic cancer (first- or second-degree relative). Screening includes annual endoscopic ultrasound (preferred) and/or MRI of the pancreas starting at age 50 or 83 years younger than the earliest age diagnosis in the family.   Prostate Cancer Screening: Consider beginning annual PSA blood test and digital rectal exams at age 65.  Additional considerations: There is insufficient evidence to recommend against radiation therapy.  Individuals with a single pathogenic ATM variant are also carriers of ataxia telangiectasia. Ataxia telangiectasia is associated with childhood cancer risks as well as other medical problems (such as difficulty with movement, balance and coordination problems, neuropathy, and weakened immunity). For there to be a risk of ataxia telangiectasia in offspring, both the patient and their partner would each have to carry a pathogenic variant in ATM; in this case, the risk to have an affected child is 25%.  This information is based on current understanding of the gene and may change in the future.  Implications for Family Members: Hereditary predisposition to cancer due to pathogenic variants in the ATM gene has autosomal dominant inheritance. This means that an individual with a pathogenic variant has a 50% chance of passing the condition on to his/her offspring. Identification of a pathogenic variant allows for the recognition of at-risk relatives who can pursue testing for the familial variant.   Family members are encouraged to consider genetic testing for this familial pathogenic variant. As there are generally no childhood cancer risks associated with single  pathogenic variants in the ATM gene, individuals in the family are not recommended to have testing until they reach at least 55 years of age. They may contact our office at [phone] for more information or to schedule an appointment. Complimentary testing for the familial variant is available for 90 days. Family members who live outside of the area are encouraged to find a genetic counselor in their area by visiting: [email or web address].  Resources: FORCE (Facing Our Risk of Cancer Empowered) is a resource for those with a hereditary predisposition to develop cancer.  FORCE provides information about risk reduction, advocacy, legislation, and clinical trials.  Additionally, FORCE provides a platform for collaboration and support; which includes: peer navigation, message boards, local support groups, a toll-free helpline, research registry and recruitment, advocate training, published medical research, webinars, brochures, mastectomy photos, and more.  For more information, visit www.facingourrisk.org  PLAN:  Ms. Wikstrom oncology team notified.  Family letter sent to encourage family testing.   We encouraged Ms. Somerville to remain in contact with [place] on an annual basis so we can update her personal and family histories, and let her know of advances in cancer genetics that may benefit the family. Our contact number was provided. Ms. Barbin questions were answered to her satisfaction today, and she knows she is welcome to call anytime with additional questions.   Taila Basinski M. Rennie Plowman, MS, [facility] Genetic Counselor [email or web address] [phone]

## 2023-02-28 ENCOUNTER — Other Ambulatory Visit: Payer: Self-pay

## 2023-02-28 ENCOUNTER — Inpatient Hospital Stay: Payer: 59 | Attending: Oncology

## 2023-02-28 ENCOUNTER — Ambulatory Visit (HOSPITAL_COMMUNITY)
Admission: RE | Admit: 2023-02-28 | Discharge: 2023-02-28 | Disposition: A | Discharge status: Home / Self Care | Payer: 59 | Source: Ambulatory Visit | Attending: Hematology and Oncology | Admitting: Hematology and Oncology

## 2023-02-28 DIAGNOSIS — C787 Secondary malignant neoplasm of liver and intrahepatic bile duct: Secondary | ICD-10-CM | POA: Insufficient documentation

## 2023-02-28 DIAGNOSIS — C50511 Malignant neoplasm of lower-outer quadrant of right female breast: Secondary | ICD-10-CM | POA: Insufficient documentation

## 2023-02-28 DIAGNOSIS — R5383 Other fatigue: Secondary | ICD-10-CM | POA: Diagnosis not present

## 2023-02-28 DIAGNOSIS — K5900 Constipation, unspecified: Secondary | ICD-10-CM | POA: Diagnosis not present

## 2023-02-28 DIAGNOSIS — Z79899 Other long term (current) drug therapy: Secondary | ICD-10-CM | POA: Diagnosis not present

## 2023-02-28 DIAGNOSIS — Z170 Estrogen receptor positive status [ER+]: Secondary | ICD-10-CM | POA: Insufficient documentation

## 2023-02-28 DIAGNOSIS — Z86718 Personal history of other venous thrombosis and embolism: Secondary | ICD-10-CM

## 2023-02-28 LAB — CBC WITH DIFFERENTIAL (CANCER CENTER ONLY)
Abs Immature Granulocytes: 0.01 10*3/uL (ref 0.00–0.07)
Basophils Absolute: 0.1 10*3/uL (ref 0.0–0.1)
Basophils Relative: 2 %
Eosinophils Absolute: 0 10*3/uL (ref 0.0–0.5)
Eosinophils Relative: 1 %
HCT: 36.5 % (ref 36.0–46.0)
Hemoglobin: 12.3 g/dL (ref 12.0–15.0)
Immature Granulocytes: 0 %
Lymphocytes Relative: 31 %
Lymphs Abs: 1.6 10*3/uL (ref 0.7–4.0)
MCH: 33.6 pg (ref 26.0–34.0)
MCHC: 33.7 g/dL (ref 30.0–36.0)
MCV: 99.7 fL (ref 80.0–100.0)
Monocytes Absolute: 0.4 10*3/uL (ref 0.1–1.0)
Monocytes Relative: 7 %
Neutro Abs: 3 10*3/uL (ref 1.7–7.7)
Neutrophils Relative %: 59 %
Platelet Count: 150 10*3/uL (ref 150–400)
RBC: 3.66 MIL/uL — ABNORMAL LOW (ref 3.87–5.11)
RDW: 15.1 % (ref 11.5–15.5)
WBC Count: 5.1 10*3/uL (ref 4.0–10.5)
nRBC: 0.4 % — ABNORMAL HIGH (ref 0.0–0.2)

## 2023-02-28 LAB — CMP (CANCER CENTER ONLY)
ALT: 19 U/L (ref 0–44)
AST: 27 U/L (ref 15–41)
Albumin: 4.1 g/dL (ref 3.5–5.0)
Alkaline Phosphatase: 104 U/L (ref 38–126)
Anion gap: 3 — ABNORMAL LOW (ref 5–15)
BUN: 18 mg/dL (ref 6–20)
CO2: 30 mmol/L (ref 22–32)
Calcium: 9.1 mg/dL (ref 8.9–10.3)
Chloride: 103 mmol/L (ref 98–111)
Creatinine: 1.03 mg/dL — ABNORMAL HIGH (ref 0.44–1.00)
GFR, Estimated: >60 mL/min (ref >60)
Glucose, Bld: 92 mg/dL (ref 70–99)
Potassium: 3.9 mmol/L (ref 3.5–5.1)
Sodium: 136 mmol/L (ref 135–145)
Total Bilirubin: 0.4 mg/dL (ref 0.3–1.2)
Total Protein: 6.4 g/dL — ABNORMAL LOW (ref 6.5–8.1)

## 2023-02-28 LAB — PROTIME-INR
INR: 2 — ABNORMAL HIGH (ref 0.8–1.2)
Prothrombin Time: 22.7 seconds — ABNORMAL HIGH (ref 11.4–15.2)

## 2023-02-28 LAB — GLUCOSE, CAPILLARY: Glucose-Capillary: 101 mg/dL — ABNORMAL HIGH (ref 70–99)

## 2023-02-28 MED ORDER — FLUDEOXYGLUCOSE F - 18 (FDG) INJECTION
5.6500 | Freq: Once | INTRAVENOUS | Status: AC
  Administered 2023-02-28: 5.65 via INTRAVENOUS

## 2023-02-28 NOTE — Progress Notes (Signed)
Patient Care Team: Lavada Mesi, MD as PCP - General (Family Medicine) Peggyann Shoals, Leda Roys, MD as Referring Physician (Oncology) Donnelly Angelica, RN as Oncology Nurse Navigator Pershing Proud, RN as Oncology Nurse Navigator Emelia Loron, MD as Consulting Physician (General Surgery) Dorothy Puffer, MD as Consulting Physician (Radiation Oncology) Pollyann Savoy, MD as Consulting Physician (Rheumatology) Levi Aland, MD as Consulting Physician (Obstetrics and Gynecology) Laurey Morale, MD as Consulting Physician (Cardiology) Armbruster, Willaim Rayas, MD as Consulting Physician (Gastroenterology) Roberts Gaudy, MD as Referring Physician (Ophthalmology) Roberts Gaudy, MD as Referring Physician (Ophthalmology) Serena Croissant, MD as Consulting Physician (Hematology and Oncology) Anselm Lis, RPH-CPP as Pharmacist (Hematology and Oncology)  DIAGNOSIS: No diagnosis found.  SUMMARY OF ONCOLOGIC HISTORY: Oncology History  Malignant neoplasm of lower-outer quadrant of right breast of female, estrogen receptor positive  08/04/2020 Initial Diagnosis   Right breast lower outer quadrant biopsy 08/04/2020 for a clinical T2 N0 M1, stage IV invasive ductal carcinoma, grade 3, triple positive, with an MIB-1 of 40%. abdominal MRI 08/24/2020 shows multiple liver lesions highly suspicious for liver metastases. Liver biopsy 09/20/2020 positive for carcinoma, prognostic panel estrogen and progesterone receptor positive, but HER-2 negative (0); MIB-1 of 30%   08/31/2020 - 12/14/2020 Chemotherapy   Neoadjuvant chemotherapy consisting of docetaxel, carboplatin, trastuzumab and pertuzumab every 21 days x 6    12/26/2020 Imaging   breast MRI 12/26/2020 showed right breast index lesion decreased to 0.8 cm; 2 additional masses in the right breast stable abdominal MRI 01/29/2021 shows no active disease in the liver    Chemotherapy   Herceptin Maintenance initially q 3 weeks, now q 4 weeks    01/29/2021 -  Anti-estrogen oral therapy   anastrozole and palbociclib    01/29/2023 Genetic Testing   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  Report date is 01/29/2023.   The Multi-Cancer + RNA Panel offered by [facility] includes sequencing and/or deletion/duplication analysis of the following 70 genes:  AIP*, ALK, APC*, ATM*, AXIN2*, BAP1*, BARD1*, BLM*, BMPR1A*, BRCA1*, BRCA2*, BRIP1*, CDC73*, CDH1*, CDK4, CDKN1B*, CDKN2A, CHEK2*, CTNNA1*, DICER1*, EPCAM (del/dup only), EGFR, FH*, FLCN*, GREM1 (promoter dup only), HOXB13, KIT, LZTR1, MAX*, MBD4, MEN1*, MET, MITF, MLH1*, MSH2*, MSH3*, MSH6*, MUTYH*, NF1*, NF2*, NTHL1*, PALB2*, PDGFRA, PMS2*, POLD1*, POLE*, POT1*, PRKAR1A*, PTCH1*, PTEN*, RAD51C*, RAD51D*, RB1*, RET, SDHA* (sequencing only), SDHAF2*, SDHB*, SDHC*, SDHD*, SMAD4*, SMARCA4*, SMARCB1*, SMARCE1*, STK11*, SUFU*, TMEM127*, TP53*, TSC1*, TSC2*, VHL*. RNA analysis is performed for * genes.   Malignant neoplasm metastatic to liver  01/03/2021 Initial Diagnosis   Liver metastases (HCC)   01/03/2021 - 10/16/2022 Chemotherapy   Patient is on Treatment Plan : BREAST Trastuzumab q21d     01/29/2023 Genetic Testing   Single pathogenic variant in ATM at c.1158del ([id number]*6).  Report date is 01/29/2023.   The Multi-Cancer + RNA Panel offered by [facility] includes sequencing and/or deletion/duplication analysis of the following 70 genes:  AIP*, ALK, APC*, ATM*, AXIN2*, BAP1*, BARD1*, BLM*, BMPR1A*, BRCA1*, BRCA2*, BRIP1*, CDC73*, CDH1*, CDK4, CDKN1B*, CDKN2A, CHEK2*, CTNNA1*, DICER1*, EPCAM (del/dup only), EGFR, FH*, FLCN*, GREM1 (promoter dup only), HOXB13, KIT, LZTR1, MAX*, MBD4, MEN1*, MET, MITF, MLH1*, MSH2*, MSH3*, MSH6*, MUTYH*, NF1*, NF2*, NTHL1*, PALB2*, PDGFRA, PMS2*, POLD1*, POLE*, POT1*, PRKAR1A*, PTCH1*, PTEN*, RAD51C*, RAD51D*, RB1*, RET, SDHA* (sequencing only), SDHAF2*, SDHB*, SDHC*, SDHD*, SMAD4*, SMARCA4*, SMARCB1*, SMARCE1*, STK11*, SUFU*, TMEM127*, TP53*,  TSC1*, TSC2*, VHL*. RNA analysis is performed for * genes.     CHIEF COMPLIANT:   INTERVAL HISTORY: Erica Wood  is a   ALLERGIES:  is allergic to beef-derived products, latex, sulfa antibiotics, and tape.  MEDICATIONS:  Current Outpatient Medications  Medication Sig Dispense Refill   acetaminophen (TYLENOL) 500 MG tablet Take 1,000 mg by mouth every 6 (six) hours as needed for moderate pain or headache.     amphetamine-dextroamphetamine (ADDERALL XR) 20 MG 24 hr capsule Take 1 capsule (20 mg total) by mouth daily. 30 capsule 0   anastrozole (ARIMIDEX) 1 MG tablet Take by mouth.     Ascorbic Acid (VITAMIN C PO) Take 3,000 mg by mouth daily.     ascorbic acid (VITAMIN C) 500 MG tablet Take by mouth.     Cholecalciferol (VITAMIN D3) 75 MCG (3000 UT) TABS Take 3,000 Units by mouth daily.     Clindamycin-Benzoyl Per, Refr, gel Apply topically.     estradiol (ESTRACE VAGINAL) 0.1 MG/GM vaginal cream Place 1 Applicatorful vaginally 3 (three) times a week. 42.5 g 12   famotidine (PEPCID) 20 MG tablet Take 20 mg by mouth at bedtime.     hydroxychloroquine (PLAQUENIL) 200 MG tablet TAKE 1 TABLET(200 MG) BY MOUTH DAILY 90 tablet 0   lidocaine-prilocaine (EMLA) cream Apply topically.     LORazepam (ATIVAN) 0.5 MG tablet Take 1 tablet (0.5 mg total) by mouth 2 (two) times daily. Take prior to scan for claustrophobia. (Patient not taking: Reported on 01/30/2023) 2 tablet 0   metoCLOPramide (REGLAN) 10 MG tablet Take 1 tablet (10 mg total) by mouth as needed. (Patient taking differently: Take 10 mg by mouth every 4 (four) hours as needed for nausea or vomiting.) 30 tablet 3   nystatin-triamcinolone ointment (MYCOLOG) Apply topically.     olaparib (LYNPARZA) 150 MG tablet Take 2 tablets (300 mg total) by mouth 2 (two) times daily. Swallow whole. May take with food to decrease nausea and vomiting. 120 tablet 3   ondansetron (ZOFRAN) 8 MG tablet Take 1 tablet (8 mg total) by mouth every 8 (eight)  hours as needed for nausea or vomiting. 30 tablet 6   spironolactone (ALDACTONE) 25 MG tablet Take 25 mg by mouth daily as needed (swelling).     traMADol (ULTRAM) 50 MG tablet Take 1-2 tablets (50-100 mg total) by mouth every 6 (six) hours as needed. 60 tablet 0   tretinoin (RETIN-A) 0.1 % cream Apply 1 application  topically at bedtime as needed (acne).     triamterene-hydrochlorothiazide (DYAZIDE) 37.5-25 MG capsule TAKE 1 CAPSULE BY MOUTH DAILY 90 capsule 1   warfarin (COUMADIN) 5 MG tablet TAKE 1 TABLET(5 MG) BY MOUTH DAILY 30 tablet PRN   No current facility-administered medications for this visit.    PHYSICAL EXAMINATION: ECOG PERFORMANCE STATUS: {CHL ONC ECOG PS:[phone]}  There were no vitals filed for this visit. There were no vitals filed for this visit.  BREAST:*** No palpable masses or nodules in either right or left breasts. No palpable axillary supraclavicular or infraclavicular adenopathy no breast tenderness or nipple discharge. (exam performed in the presence of a chaperone)  LABORATORY DATA:  I have reviewed the data as listed    Latest Ref Rng & Units 02/28/2023   12:37 PM 01/28/2023    2:51 PM 12/31/2022    2:17 PM  CMP  Glucose 70 - 99 mg/dL 92  94  96   BUN 6 - 20 mg/dL Creatinine 0.44 - 1.00 mg/dL 1.61  0.96  0.45   Sodium 135 - 145 mmol/L 136  137  135  Potassium 3.5 - 5.1 mmol/L 3.9  4.1  4.0   Chloride 98 - 111 mmol/L 103  103  101   CO2 22 - 32 mmol/L Calcium 8.9 - 10.3 mg/dL 9.1  9.0  8.7   Total Protein 6.5 - 8.1 g/dL 6.4  6.3  6.6   Total Bilirubin 0.3 - 1.2 mg/dL 0.4  0.5  0.4   Alkaline Phos 38 - 126 U/L 104  112  106   AST 15 - 41 U/L ALT 0 - 44 U/L Lab Results  Component Value Date   WBC 5.1 02/28/2023   HGB 12.3 02/28/2023   HCT 36.5 02/28/2023   MCV 99.7 02/28/2023   PLT 150 02/28/2023   NEUTROABS 3.0 02/28/2023    ASSESSMENT & PLAN:  No problem-specific Assessment & Plan  notes found for this encounter.    No orders of the defined types were placed in this encounter.  The patient has a good understanding of the overall plan. she agrees with it. she will call with any problems that may develop before the next visit here. Total time spent: 30 mins including face to face time and time spent for planning, charting and co-ordination of care   Sherlyn Lick, CMA 02/28/23    I Janan Ridge am acting as a Neurosurgeon for [employer]  ***

## 2023-03-01 LAB — CANCER ANTIGEN 27.29: CA 27.29: 1365.6 U/mL — ABNORMAL HIGH (ref 0.0–38.6)

## 2023-03-04 ENCOUNTER — Inpatient Hospital Stay: Payer: 59

## 2023-03-04 ENCOUNTER — Inpatient Hospital Stay (HOSPITAL_BASED_OUTPATIENT_CLINIC_OR_DEPARTMENT_OTHER): Payer: 59 | Admitting: Hematology and Oncology

## 2023-03-04 ENCOUNTER — Other Ambulatory Visit: Payer: Self-pay

## 2023-03-04 VITALS — BP 117/53 | HR 66 | Temp 97.3°F | Resp 18 | Ht 64.0 in | Wt 114.3 lb

## 2023-03-04 DIAGNOSIS — C50511 Malignant neoplasm of lower-outer quadrant of right female breast: Secondary | ICD-10-CM

## 2023-03-04 DIAGNOSIS — C787 Secondary malignant neoplasm of liver and intrahepatic bile duct: Secondary | ICD-10-CM | POA: Diagnosis not present

## 2023-03-04 DIAGNOSIS — Z170 Estrogen receptor positive status [ER+]: Secondary | ICD-10-CM | POA: Diagnosis not present

## 2023-03-04 DIAGNOSIS — Z95828 Presence of other vascular implants and grafts: Secondary | ICD-10-CM

## 2023-03-04 MED ORDER — HEPARIN SOD (PORK) LOCK FLUSH 100 UNIT/ML IV SOLN
500.0000 [IU] | Freq: Once | INTRAVENOUS | Status: AC
  Administered 2023-03-04: 500 [IU]

## 2023-03-04 MED ORDER — SODIUM CHLORIDE 0.9% FLUSH
10.0000 mL | Freq: Once | INTRAVENOUS | Status: AC
  Administered 2023-03-04: 10 mL

## 2023-03-04 NOTE — Assessment & Plan Note (Signed)
08/04/2020: Right breast biopsy T2 N0 M1 stage IV IDC grade 3, triple positive with a Ki-67 of 40%, multiple liver lesions biopsy was positive for breast cancer ER/PR positive HER2 negative with a Ki-67 30% 08/31/2020-12/14/2020: TCHP x6  Letrozole and palbociclib (started 01/29/2021)   COVID infection January 2023: Held Ilda Foil and Herceptin treatments.  Resumed 02/06/2022   01/17/2022: CT CAP: Very subtle metastatic lesion of the caudate lobe is unchanged.  No new metastatic disease elsewhere.  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  Will refer the patient to genetic testing   Current treatment: Olaparib started 11/29/2022 (200 mg p.o. twice daily) dose increased to 300 p.o. twice daily 01/01/2023   Treatment toxicities: Fatigue   Lab review: CA 27-29: 1365 it was 1159 01/28/2023 (was 1049 and previously it was 385 on 10/16/2022) Current dosage: Olaparib 300 mg p.o. twice daily  PET CT scan 03/03/2023: Widespread hypermetabolic metastatic disease within the liver is similar to previous PET/CT, some of the lesions have mildly decreased in size and metabolic activity consistent with response to treatment.  Small lytic lesions right acromion and right inferior pubic ramus  There is a discordance between the CA 27-29 and the PET CT scan result.  Treatment decisions are always based on scans.  Therefore I recommended continuation of therapy.

## 2023-03-10 ENCOUNTER — Other Ambulatory Visit: Payer: Self-pay

## 2023-03-10 MED ORDER — AMPHETAMINE-DEXTROAMPHET ER 20 MG PO CP24: 20.0000 mg | ORAL_CAPSULE | Freq: Every day | ORAL | 0 refills | Status: DC

## 2023-03-10 NOTE — Telephone Encounter (Signed)
Patient called requesting refill of adderall to [facility] on [address].   Last Fill: 02/06/2023  Next Visit: 07/02/2023  Last Visit: 01/30/2023  DX: Primary insomnia  Current Dose per office note on 01/30/2023: not discussed.  Okay to refill adderall?

## 2023-03-27 ENCOUNTER — Other Ambulatory Visit: Payer: Self-pay | Admitting: Hematology and Oncology

## 2023-04-05 ENCOUNTER — Other Ambulatory Visit: Payer: Self-pay | Admitting: Hematology and Oncology

## 2023-04-11 ENCOUNTER — Other Ambulatory Visit: Payer: Self-pay | Admitting: *Deleted

## 2023-04-11 MED ORDER — AMPHETAMINE-DEXTROAMPHET ER 20 MG PO CP24: 20.0000 mg | ORAL_CAPSULE | Freq: Every day | ORAL | 0 refills | Status: DC

## 2023-04-11 NOTE — Telephone Encounter (Signed)
Patient contacted the office and requested a refill of Adderall to be sent to the [facility].   Last Fill: 03/10/2023  Next Visit: 07/02/2023  Last Visit: 01/30/2023  Dx:  Primary insomnia   Current Dose per office note on 01/30/2023: not discussed  Okay to refill Adderall?

## 2023-04-16 ENCOUNTER — Other Ambulatory Visit: Payer: Self-pay | Admitting: Hematology and Oncology

## 2023-04-16 DIAGNOSIS — C50511 Malignant neoplasm of lower-outer quadrant of right female breast: Secondary | ICD-10-CM

## 2023-05-06 ENCOUNTER — Telehealth: Payer: Self-pay | Admitting: *Deleted

## 2023-05-06 NOTE — Telephone Encounter (Signed)
"  Elainna Eshleman [place], [phone] (home) calling to check status of form.  Today is the last day." Advised other forms nurse completed form.  MD signed on 624/2024.  This nurse will fax today. "I will pick up form tomorrow."  Successfully faxed to [employer] fax no# [phone].

## 2023-05-12 ENCOUNTER — Other Ambulatory Visit: Payer: Self-pay | Admitting: *Deleted

## 2023-05-12 MED ORDER — AMPHETAMINE-DEXTROAMPHET ER 20 MG PO CP24: 20.0000 mg | ORAL_CAPSULE | Freq: Every day | ORAL | 0 refills | Status: DC

## 2023-05-12 NOTE — Telephone Encounter (Signed)
Patient contacted the office requesting a refill on Adderall to be sent to the [facility].   Last Fill: 04/11/2023  Next Visit: 07/02/2023  Last Visit: 01/30/2023  Dx: not discussed  Current Dose per office note on 01/30/2023: not discussed   Okay to refill Adderall?

## 2023-05-18 ENCOUNTER — Other Ambulatory Visit: Payer: Self-pay | Admitting: Physician Assistant

## 2023-05-19 NOTE — Telephone Encounter (Signed)
Last Fill: 02/17/2023  Eye exam: 04/23/2023   Labs: 02/28/2023 RBC 3.66, nRBC 0.4, creatinine 1.03, total protein 6.4, anion gap 3  Next Visit: 07/02/2023  Last Visit: 01/30/2023  DX:SLE (systemic lupus erythematosus related syndrome)   Current Dose per office note on 01/30/2023: Plaquenil 200 mg 1 tablet by mouth daily   Okay to refill Plaquenil?

## 2023-06-02 ENCOUNTER — Encounter (HOSPITAL_COMMUNITY)
Admission: RE | Admit: 2023-06-02 | Discharge: 2023-06-02 | Disposition: A | Discharge status: Home / Self Care | Payer: 59 | Source: Ambulatory Visit | Attending: Hematology and Oncology | Admitting: Hematology and Oncology

## 2023-06-02 DIAGNOSIS — Z170 Estrogen receptor positive status [ER+]: Secondary | ICD-10-CM | POA: Insufficient documentation

## 2023-06-02 DIAGNOSIS — C50511 Malignant neoplasm of lower-outer quadrant of right female breast: Secondary | ICD-10-CM | POA: Diagnosis not present

## 2023-06-02 LAB — GLUCOSE, CAPILLARY: Glucose-Capillary: 96 mg/dL (ref 70–99)

## 2023-06-02 MED ORDER — FLUDEOXYGLUCOSE F - 18 (FDG) INJECTION
5.5000 | Freq: Once | INTRAVENOUS | Status: AC | PRN
  Administered 2023-06-02: 5.67 via INTRAVENOUS

## 2023-06-03 ENCOUNTER — Other Ambulatory Visit: Payer: Self-pay

## 2023-06-03 ENCOUNTER — Inpatient Hospital Stay: Payer: 59 | Attending: Oncology

## 2023-06-03 ENCOUNTER — Inpatient Hospital Stay: Payer: 59

## 2023-06-03 DIAGNOSIS — C7951 Secondary malignant neoplasm of bone: Secondary | ICD-10-CM | POA: Insufficient documentation

## 2023-06-03 DIAGNOSIS — Z95828 Presence of other vascular implants and grafts: Secondary | ICD-10-CM

## 2023-06-03 DIAGNOSIS — Z86718 Personal history of other venous thrombosis and embolism: Secondary | ICD-10-CM

## 2023-06-03 DIAGNOSIS — Z79899 Other long term (current) drug therapy: Secondary | ICD-10-CM | POA: Diagnosis not present

## 2023-06-03 DIAGNOSIS — Z79811 Long term (current) use of aromatase inhibitors: Secondary | ICD-10-CM | POA: Insufficient documentation

## 2023-06-03 DIAGNOSIS — C50511 Malignant neoplasm of lower-outer quadrant of right female breast: Secondary | ICD-10-CM

## 2023-06-03 DIAGNOSIS — Z170 Estrogen receptor positive status [ER+]: Secondary | ICD-10-CM | POA: Diagnosis not present

## 2023-06-03 DIAGNOSIS — C787 Secondary malignant neoplasm of liver and intrahepatic bile duct: Secondary | ICD-10-CM | POA: Diagnosis present

## 2023-06-03 LAB — CBC WITH DIFFERENTIAL (CANCER CENTER ONLY)
Abs Immature Granulocytes: 0.02 10*3/uL (ref 0.00–0.07)
Basophils Absolute: 0.1 10*3/uL (ref 0.0–0.1)
Basophils Relative: 1 %
Eosinophils Absolute: 0 10*3/uL (ref 0.0–0.5)
Eosinophils Relative: 0 %
HCT: 38.3 % (ref 36.0–46.0)
Hemoglobin: 13.2 g/dL (ref 12.0–15.0)
Immature Granulocytes: 0 %
Lymphocytes Relative: 21 %
Lymphs Abs: 1.4 10*3/uL (ref 0.7–4.0)
MCH: 35 pg — ABNORMAL HIGH (ref 26.0–34.0)
MCHC: 34.5 g/dL (ref 30.0–36.0)
MCV: 101.6 fL — ABNORMAL HIGH (ref 80.0–100.0)
Monocytes Absolute: 0.5 10*3/uL (ref 0.1–1.0)
Monocytes Relative: 7 %
Neutro Abs: 4.8 10*3/uL (ref 1.7–7.7)
Neutrophils Relative %: 71 %
Platelet Count: 146 10*3/uL — ABNORMAL LOW (ref 150–400)
RBC: 3.77 MIL/uL — ABNORMAL LOW (ref 3.87–5.11)
RDW: 14.2 % (ref 11.5–15.5)
WBC Count: 6.9 10*3/uL (ref 4.0–10.5)
nRBC: 0 % (ref 0.0–0.2)

## 2023-06-03 LAB — CMP (CANCER CENTER ONLY)
ALT: 27 U/L (ref 0–44)
AST: 41 U/L (ref 15–41)
Albumin: 4.2 g/dL (ref 3.5–5.0)
Alkaline Phosphatase: 134 U/L — ABNORMAL HIGH (ref 38–126)
Anion gap: 3 — ABNORMAL LOW (ref 5–15)
BUN: 17 mg/dL (ref 6–20)
CO2: 29 mmol/L (ref 22–32)
Calcium: 9.3 mg/dL (ref 8.9–10.3)
Chloride: 104 mmol/L (ref 98–111)
Creatinine: 1.03 mg/dL — ABNORMAL HIGH (ref 0.44–1.00)
GFR, Estimated: >60 mL/min (ref >60)
Glucose, Bld: 76 mg/dL (ref 70–99)
Potassium: 4.1 mmol/L (ref 3.5–5.1)
Sodium: 136 mmol/L (ref 135–145)
Total Bilirubin: 0.4 mg/dL (ref 0.3–1.2)
Total Protein: 6.4 g/dL — ABNORMAL LOW (ref 6.5–8.1)

## 2023-06-03 LAB — PROTIME-INR
INR: 3.3 — ABNORMAL HIGH (ref 0.8–1.2)
Prothrombin Time: 33.6 seconds — ABNORMAL HIGH (ref 11.4–15.2)

## 2023-06-03 MED ORDER — SODIUM CHLORIDE 0.9% FLUSH
10.0000 mL | Freq: Once | INTRAVENOUS | Status: AC
  Administered 2023-06-03: 10 mL

## 2023-06-03 MED ORDER — HEPARIN SOD (PORK) LOCK FLUSH 100 UNIT/ML IV SOLN
500.0000 [IU] | Freq: Once | INTRAVENOUS | Status: AC
  Administered 2023-06-03: 500 [IU]

## 2023-06-04 LAB — CANCER ANTIGEN 27.29: CA 27.29: 3332.8 U/mL — ABNORMAL HIGH (ref 0.0–38.6)

## 2023-06-04 NOTE — Progress Notes (Signed)
Patient Care Team: Lavada Mesi, MD as PCP - General (Family Medicine) Peggyann Shoals, Leda Roys, MD as Referring Physician (Oncology) Donnelly Angelica, RN as Oncology Nurse Navigator Pershing Proud, RN as Oncology Nurse Navigator Emelia Loron, MD as Consulting Physician (General Surgery) Dorothy Puffer, MD as Consulting Physician (Radiation Oncology) Pollyann Savoy, MD as Consulting Physician (Rheumatology) Levi Aland, MD as Consulting Physician (Obstetrics and Gynecology) Laurey Morale, MD as Consulting Physician (Cardiology) Armbruster, Willaim Rayas, MD as Consulting Physician (Gastroenterology) Roberts Gaudy, MD as Referring Physician (Ophthalmology) Roberts Gaudy, MD as Referring Physician (Ophthalmology) Serena Croissant, MD as Consulting Physician (Hematology and Oncology) Anselm Lis, RPH-CPP as Pharmacist (Hematology and Oncology)  DIAGNOSIS:  Encounter Diagnosis  Name Primary?   Malignant neoplasm of lower-outer quadrant of right breast of female, estrogen receptor positive (HCC) Yes    SUMMARY OF ONCOLOGIC HISTORY: Oncology History  Malignant neoplasm of lower-outer quadrant of right breast of female, estrogen receptor positive (HCC)  08/04/2020 Initial Diagnosis   Right breast lower outer quadrant biopsy 08/04/2020 for a clinical T2 N0 M1, stage IV invasive ductal carcinoma, grade 3, triple positive, with an MIB-1 of 40%. abdominal MRI 08/24/2020 shows multiple liver lesions highly suspicious for liver metastases. Liver biopsy 09/20/2020 positive for carcinoma, prognostic panel estrogen and progesterone receptor positive, but HER-2 negative (0); MIB-1 of 30%   08/31/2020 - 12/14/2020 Chemotherapy   Neoadjuvant chemotherapy consisting of docetaxel, carboplatin, trastuzumab and pertuzumab every 21 days x 6    12/26/2020 Imaging   breast MRI 12/26/2020 showed right breast index lesion decreased to 0.8 cm; 2 additional masses in the right breast  stable abdominal MRI 01/29/2021 shows no active disease in the liver    Chemotherapy   Herceptin Maintenance initially q 3 weeks, now q 4 weeks   01/29/2021 -  Anti-estrogen oral therapy   anastrozole and palbociclib    01/29/2023 Genetic Testing   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  Report date is 01/29/2023.   The Multi-Cancer + RNA Panel offered by [facility] includes sequencing and/or deletion/duplication analysis of the following 70 genes:  AIP*, ALK, APC*, ATM*, AXIN2*, BAP1*, BARD1*, BLM*, BMPR1A*, BRCA1*, BRCA2*, BRIP1*, CDC73*, CDH1*, CDK4, CDKN1B*, CDKN2A, CHEK2*, CTNNA1*, DICER1*, EPCAM (del/dup only), EGFR, FH*, FLCN*, GREM1 (promoter dup only), HOXB13, KIT, LZTR1, MAX*, MBD4, MEN1*, MET, MITF, MLH1*, MSH2*, MSH3*, MSH6*, MUTYH*, NF1*, NF2*, NTHL1*, PALB2*, PDGFRA, PMS2*, POLD1*, POLE*, POT1*, PRKAR1A*, PTCH1*, PTEN*, RAD51C*, RAD51D*, RB1*, RET, SDHA* (sequencing only), SDHAF2*, SDHB*, SDHC*, SDHD*, SMAD4*, SMARCA4*, SMARCB1*, SMARCE1*, STK11*, SUFU*, TMEM127*, TP53*, TSC1*, TSC2*, VHL*. RNA analysis is performed for * genes.   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  Report date is 01/29/2023.   The Multi-Cancer + RNA Panel offered by [facility] includes sequencing and/or deletion/duplication analysis of the following 70 genes:  AIP*, ALK, APC*, ATM*, AXIN2*, BAP1*, BARD1*, BLM*, BMPR1A*, BRCA1*, BRCA2*, BRIP1*, CDC73*, CDH1*, CDK4, CDKN1B*, CDKN2A, CHEK2*, CTNNA1*, DICER1*, EPCAM (del/dup only), EGFR, FH*, FLCN*, GREM1 (promoter dup only), HOXB13, KIT, LZTR1, MAX*, MBD4, MEN1*, MET, MITF, MLH1*, MSH2*, MSH3*, MSH6*, MUTYH*, NF1*, NF2*, NTHL1*, PALB2*, PDGFRA, PMS2*, POLD1*, POLE*, POT1*, PRKAR1A*, PTCH1*, PTEN*,  RAD51C*, RAD51D*, RB1*, RET, SDHA* (sequencing only), SDHAF2*, SDHB*, SDHC*, SDHD*, SMAD4*, SMARCA4*, SMARCB1*, SMARCE1*, STK11*, SUFU*, HYQM578*,  TP53*, TSC1*, TSC2*, VHL*. RNA analysis is performed for * genes.     CHIEF COMPLIANT: Follow-up of scans  INTERVAL HISTORY: Erica Wood is a 57 y.o. with above-mentioned history of  Metastatic breast cancer who progressed on Herceptin and olaparib. She presents to the clinic for a follow-up. Pt reports last week she was nauseated. She feels bloated and has fatigue.   ALLERGIES:  is allergic to beef-derived products, latex, sulfa antibiotics, and tape.  MEDICATIONS:  Current Outpatient Medications  Medication Sig Dispense Refill   abemaciclib (VERZENIO) 100 MG tablet Take 1 tablet (100 mg total) by mouth 2 (two) times daily. 60 tablet 3   acetaminophen (TYLENOL) 500 MG tablet Take 1,000 mg by mouth every 6 (six) hours as needed for moderate pain or headache.     amphetamine-dextroamphetamine (ADDERALL XR) 20 MG 24 hr capsule Take 1 capsule (20 mg total) by mouth daily. 30 capsule 0   Ascorbic Acid (VITAMIN C PO) Take 3,000 mg by mouth daily.     ascorbic acid (VITAMIN C) 500 MG tablet Take by mouth.     Cholecalciferol (VITAMIN D3) 75 MCG (3000 UT) TABS Take 3,000 Units by mouth daily.     Clindamycin-Benzoyl Per, Refr, gel Apply topically.     estradiol (ESTRACE) 0.1 MG/GM vaginal cream INSERT 1 APPLICATORFUL VAGINALLY 3 TIMES A WEEK 42.5 g 12   famotidine (PEPCID) 20 MG tablet Take 20 mg by mouth at bedtime.     hydroxychloroquine (PLAQUENIL) 200 MG tablet TAKE 1 TABLET(200 MG) BY MOUTH DAILY 90 tablet 0   letrozole (FEMARA) 2.5 MG tablet Take 1 tablet (2.5 mg total) by mouth daily. 90 tablet 3   lidocaine-prilocaine (EMLA) cream Apply topically.     LORazepam (ATIVAN) 0.5 MG tablet Take 1 tablet (0.5 mg total) by mouth 2 (two) times daily. Take prior to scan for claustrophobia. 2 tablet 0   LYNPARZA 150 MG tablet TAKE 2 TABLETS BY  MOUTH TWICE  DAILY SWALLOW WHOLE. MAY TAKE  WITH FOOD TO DECREASE NAUSEA AND VOMITING 120 tablet 3   metoCLOPramide (REGLAN) 10 MG tablet Take 1 tablet (10 mg total) by mouth every 4 (four) hours as needed for nausea or vomiting. 30 tablet 3   nystatin-triamcinolone ointment (MYCOLOG) Apply topically.     ondansetron (ZOFRAN) 8 MG tablet Take 1 tablet (8 mg total) by mouth every 8 (eight) hours as needed for nausea or vomiting. 30 tablet 6   spironolactone (ALDACTONE) 25 MG tablet Take 25 mg by mouth daily as needed (swelling).     traMADol (ULTRAM) 50 MG tablet Take 1-2 tablets (50-100 mg total) by mouth every 6 (six) hours as needed. 60 tablet 0   tretinoin (RETIN-A) 0.1 % cream Apply 1 application  topically at bedtime as needed (acne).     triamterene-hydrochlorothiazide (DYAZIDE) 37.5-25 MG capsule TAKE 1 CAPSULE BY MOUTH DAILY 90 capsule 1   warfarin (COUMADIN) 5 MG tablet TAKE 1 TABLET(5 MG) BY MOUTH DAILY 30 tablet PRN   anastrozole (ARIMIDEX) 1 MG tablet Take 1 tablet (1 mg total) by mouth daily. 90 tablet 3   No current facility-administered medications for this visit.    PHYSICAL EXAMINATION: ECOG PERFORMANCE STATUS: 1 - Symptomatic but completely ambulatory  Vitals:   06/05/23 1352  BP: (!) 122/59  Pulse: 64  Resp: 18  Temp: (!) 97.5 F (36.4 C)  SpO2: 100%   Filed Weights   06/05/23 1352  Weight: 111 lb 1.6 oz (50.4 kg)  LABORATORY DATA:  I have reviewed the data as listed    Latest Ref Rng & Units 06/03/2023    3:39 PM 02/28/2023   12:37 PM 01/28/2023    2:51 PM  CMP  Glucose 70 - 99 mg/dL 76  92  94   BUN 6 - 20 mg/dL 17  18  19    Creatinine 0.44 - 1.00 mg/dL 1.60  7.37  1.06   Sodium 135 - 145 mmol/L 136  136  137   Potassium 3.5 - 5.1 mmol/L 4.1  3.9  4.1   Chloride 98 - 111 mmol/L 104  103  103   CO2 22 - 32 mmol/L 29  30  31    Calcium 8.9 - 10.3 mg/dL 9.3  9.1  9.0   Total Protein 6.5 - 8.1 g/dL 6.4  6.4  6.3   Total Bilirubin 0.3 - 1.2 mg/dL 0.4   0.4  0.5   Alkaline Phos 38 - 126 U/L 134  104  112   AST 15 - 41 U/L 41  27  27   ALT 0 - 44 U/L 27  19  21      Lab Results  Component Value Date   WBC 6.9 06/03/2023   HGB 13.2 06/03/2023   HCT 38.3 06/03/2023   MCV 101.6 (H) 06/03/2023   PLT 146 (L) 06/03/2023   NEUTROABS 4.8 06/03/2023    ASSESSMENT & PLAN:  Malignant neoplasm of lower-outer quadrant of right breast of female, estrogen receptor positive (HCC) 08/04/2020: Right breast biopsy T2 N0 M1 stage IV IDC grade 3, triple positive with a Ki-67 of 40%, multiple liver lesions biopsy was positive for breast cancer ER/PR positive HER2 negative with a Ki-67 30% 08/31/2020-12/14/2020: TCHP x6 01/29/2021: Letrozole and palbociclib   11/29/2022-06/05/2023: Olaparib  10/24/2022: CT CAP: Multiple new hepatic lesions measuring up to 1.5 cm consistent with metastatic disease, stable 5 mm nodule left costophrenic sulcus 12/05/2021: Guardant360: ATM gene mutation (possible treatment option olaparib), MSI high: Not detected.  Will refer the patient to genetic testing   Lab review: CA 27-29: 1365 it was 1159 01/28/2023 (was 1049 and previously it was 385 on 10/16/2022)   PET CT scan 03/03/2023: Widespread hypermetabolic metastatic disease within the liver is similar to previous PET/CT, some of the lesions have mildly decreased in size and metabolic activity consistent with response to treatment.  Small lytic lesions right acromion and right inferior pubic ramus PET/CT 06/02/2023: Interval progression of hepatic metastases.  Progression of bone metastases.  Suspicion of peritoneal disease.    Treatment plan:   Biopsy of liver metastases: To determine HER2 status.  Patient will only do this biopsy if she can be done under sedation/anesthesia.  I will check if that is an option. Treatment options: Xeloda versus Faslodex with Verzinio and letrozole We will repeat Guardant360 Bone metastasis: Recommended Xgeva along with calcium and vitamin  D.  Recommendation: Faslodex with Verzinio and letrozole to be started in a week.  I sent a prescription for letrozole.  I also sent a prescription for Verzinio to [facility].  Orders Placed This Encounter  Procedures   CT CHEST ABDOMEN PELVIS W CONTRAST    Standing Status:   Future    Standing Expiration Date:   06/04/2024    Order Specific Question:   If indicated for the ordered procedure, I authorize the administration of contrast media per Radiology protocol    Answer:   Yes    Order Specific Question:   Does  the patient have a contrast media/X-ray dye allergy?    Answer:   No    Order Specific Question:   Is patient pregnant?    Answer:   No    Order Specific Question:   Preferred imaging location?    Answer:   [facility]    Order Specific Question:   If indicated for the ordered procedure, I authorize the administration of oral contrast media per Radiology protocol    Answer:   Yes   Guardant 360    Standing Status:   Future    Number of Occurrences:   1    Standing Expiration Date:   06/04/2024   The patient has a good understanding of the overall plan. she agrees with it. she will call with any problems that may develop before the next visit here. Total time spent: 30 mins including face to face time and time spent for planning, charting and co-ordination of care   Tamsen Meek, MD 06/05/23    I Janan Ridge am acting as a Neurosurgeon for [employer]  I have reviewed the above documentation for accuracy and completeness, and I agree with the above.

## 2023-06-05 ENCOUNTER — Inpatient Hospital Stay (HOSPITAL_BASED_OUTPATIENT_CLINIC_OR_DEPARTMENT_OTHER): Payer: 59 | Admitting: Hematology and Oncology

## 2023-06-05 ENCOUNTER — Other Ambulatory Visit (HOSPITAL_COMMUNITY): Payer: Self-pay

## 2023-06-05 ENCOUNTER — Other Ambulatory Visit: Payer: Self-pay | Admitting: Hematology and Oncology

## 2023-06-05 ENCOUNTER — Inpatient Hospital Stay: Payer: 59

## 2023-06-05 ENCOUNTER — Telehealth: Payer: Self-pay

## 2023-06-05 ENCOUNTER — Other Ambulatory Visit: Payer: Self-pay

## 2023-06-05 VITALS — BP 122/59 | HR 64 | Temp 97.5°F | Resp 18 | Ht 64.0 in | Wt 111.1 lb

## 2023-06-05 DIAGNOSIS — C787 Secondary malignant neoplasm of liver and intrahepatic bile duct: Secondary | ICD-10-CM | POA: Diagnosis not present

## 2023-06-05 DIAGNOSIS — C50511 Malignant neoplasm of lower-outer quadrant of right female breast: Secondary | ICD-10-CM | POA: Diagnosis not present

## 2023-06-05 DIAGNOSIS — Z170 Estrogen receptor positive status [ER+]: Secondary | ICD-10-CM | POA: Diagnosis not present

## 2023-06-05 MED ORDER — ABEMACICLIB 100 MG PO TABS
100.0000 mg | ORAL_TABLET | Freq: Two times a day (BID) | ORAL | 3 refills | Status: DC
  Filled 2023-06-05: qty 70, 35d supply, fill #0

## 2023-06-05 MED ORDER — LETROZOLE 2.5 MG PO TABS: 2.5000 mg | ORAL_TABLET | Freq: Every day | ORAL | 3 refills | Status: DC

## 2023-06-05 MED ORDER — ANASTROZOLE 1 MG PO TABS: 1.0000 mg | ORAL_TABLET | Freq: Every day | ORAL | 3 refills | Status: DC

## 2023-06-05 NOTE — Assessment & Plan Note (Addendum)
08/04/2020: Right breast biopsy T2 N0 M1 stage IV IDC grade 3, triple positive with a Ki-67 of 40%, multiple liver lesions biopsy was positive for breast cancer ER/PR positive HER2 negative with a Ki-67 30% 08/31/2020-12/14/2020: TCHP x6 01/29/2021: Letrozole and palbociclib   11/29/2022-06/05/2023: Olaparib  10/24/2022: CT CAP: Multiple new hepatic lesions measuring up to 1.5 cm consistent with metastatic disease, stable 5 mm nodule left costophrenic sulcus 12/05/2021: Guardant360: ATM gene mutation (possible treatment option olaparib), MSI high: Not detected.  Will refer the patient to genetic testing   Lab review: CA 27-29: 1365 it was 1159 01/28/2023 (was 1049 and previously it was 385 on 10/16/2022)   PET CT scan 03/03/2023: Widespread hypermetabolic metastatic disease within the liver is similar to previous PET/CT, some of the lesions have mildly decreased in size and metabolic activity consistent with response to treatment.  Small lytic lesions right acromion and right inferior pubic ramus PET/CT 06/02/2023: Interval progression of hepatic metastases.  Progression of bone metastases.  Suspicion of peritoneal disease.    Treatment plan:   Biopsy of liver metastases: To determine HER2 status. Treatment options: Xeloda versus Faslodex with Verzinio and letrozole We will repeat Guardant360

## 2023-06-06 ENCOUNTER — Telehealth: Payer: Self-pay | Admitting: Pharmacy Technician

## 2023-06-06 ENCOUNTER — Telehealth: Payer: Self-pay

## 2023-06-06 ENCOUNTER — Other Ambulatory Visit (HOSPITAL_COMMUNITY): Payer: Self-pay

## 2023-06-06 ENCOUNTER — Encounter: Payer: Self-pay | Admitting: Hematology and Oncology

## 2023-06-06 MED ORDER — ABEMACICLIB 100 MG PO TABS
100.0000 mg | ORAL_TABLET | Freq: Two times a day (BID) | ORAL | 3 refills | Status: AC
Start: 2023-06-06 — End: ?

## 2023-06-06 NOTE — Telephone Encounter (Signed)
Oral Oncology Pharmacist Encounter  Received new prescription for Verzenio (abemaciclib) for the treatment of HR positive, HER2 negative breast cancer in conjunction with faslodex and letrozole, planned duration until disease progression or unacceptable toxicity.  Labs from 06/03/23 assessed, no adjustments needed. Prescription dose determined by MD for tolerability. Prescription dose and frequency assessed for appropriateness.  Current medication list in Epic reviewed, no significant/ relevant DDIs with Verzenio identified.  Evaluated chart and no patient barriers to medication adherence noted.   Patient agreement for treatment documented in MD note on 06/05/23.  Prescription has been e-scribed to the [employer] [facility] [employer] for benefits analysis and approval.  Oral Oncology Clinic will continue to follow for insurance authorization, copayment issues, initial counseling and start date.  Bethel Born, PharmD Hematology/Oncology Clinical Pharmacist [facility] Oral Chemotherapy Navigation Clinic [phone] 06/06/2023 1:52 PM

## 2023-06-06 NOTE — Telephone Encounter (Signed)
Spoke with Patient regarding request for Continuous FMLA and Short Term Disability. She currently has Intermittent FMLA. Advised Patient to speak with her Employer and the [employer] Department  regarding their guidelines for filing for leave. Provided Patient with email address to send forms to this office. No other needs or concerns voiced at this time.

## 2023-06-06 NOTE — Telephone Encounter (Addendum)
Oral Oncology Patient Advocate Encounter  Prior Authorization for Kathlen Mody has been approved.    PA#  [id number] Effective dates: 06/06/23 through 06/05/24  Patient must fill at [facility]   Jinger Neighbors, CPhT-Adv Oncology Pharmacy Patient Advocate [facility] Direct Number: [phone]  Fax: [phone]

## 2023-06-06 NOTE — Telephone Encounter (Signed)
Oral Oncology Patient Advocate Encounter   Received notification that prior authorization for Verzenio is required.   PA submitted on 06/06/23 Key [id number] Status is pending     Jinger Neighbors, CPhT-Adv Oncology Pharmacy Patient Advocate [facility] Direct Number: [phone]  Fax: [phone]

## 2023-06-06 NOTE — Telephone Encounter (Signed)
Called pt to discuss need for general anesthesia for liver bx. She states the last liver bx she had was a traumatic experience. She states she had versed and fentanyl but was able to feel everything and was begging the radiologist to stop the procedure. She states she will only have this procedure if she is not awake.  S/w Tiffany in IR and she states they are scheduled out through September for gen anesth, and that Dr Pamelia Hoit would need to speak with one of the radiologists about need for gen anesth. Advised MD of this and he will contact.

## 2023-06-06 NOTE — Telephone Encounter (Signed)
Oral Chemotherapy Pharmacist Encounter  I spoke with patient for overview of: Verzenio for the treatment of presumed HER2 negative, hormone-receptor positive breast cancer, in combination with faslodex and letrozole, planned duration until disease progression or unacceptable toxicity.   Counseled patient on administration, dosing, side effects, monitoring, drug-food interactions, safe handling, storage, and disposal.  Patient will take Verzenio 100mg  tablets, 1 tablet by mouth twice daily without regard to food.  Patient knows to avoid grapefruit and grapefruit juice.  Verzenio start date: once patient receives from [facility]  Adverse effects include but are not limited to: diarrhea, fatigue, nausea, abdominal pain, decreased blood counts, and increased liver function tests, and joint pains. Severe, life-threatening, and/or fatal interstitial lung disease (ILD) and/or pneumonitis may occur with CDK 4/6 inhibitors.  Patient has anti-emetic on hand and knows to take it if nausea develops.   Patient will obtain anti diarrheal and alert the office of 4 or more loose stools above baseline.  Reviewed with patient importance of keeping a medication schedule and plan for any missed doses. No barriers to medication adherence identified.  Medication reconciliation performed and medication/allergy list updated.  Insurance authorization for [employer] has been obtained. Patient will receive medication through [facility].  Patient informed the pharmacy will reach out 5-7 days prior to needing next fill of Verzenio to coordinate continued medication acquisition to prevent break in therapy.  All questions answered.  Patient voiced understanding and appreciation.   Medication education handout placed in mail for patient. Patient knows to call the office with questions or concerns. Oral Chemotherapy Clinic phone number provided to patient.   Bethel Born, PharmD Hematology/Oncology  Clinical Pharmacist [facility] Oral Chemotherapy Navigation Clinic [phone] 06/06/2023   4:28 PM

## 2023-06-06 NOTE — Telephone Encounter (Signed)
Oral Oncology Patient Advocate Encounter   Was successful in obtaining a copay card for Verzenio.  This copay card will make the patients copay $0.  The billing information is as follows and has been shared with [facility].   RxBin: [id number] PCN: [id number] Member ID: [id number] Group ID: [id number]   Jinger Neighbors, CPhT-Adv Oncology Pharmacy Patient Advocate [facility] Direct Number: [phone]  Fax: [phone]

## 2023-06-09 ENCOUNTER — Telehealth: Payer: Self-pay | Admitting: Hematology and Oncology

## 2023-06-09 ENCOUNTER — Other Ambulatory Visit: Payer: Self-pay | Admitting: Hematology and Oncology

## 2023-06-09 NOTE — Telephone Encounter (Signed)
Scheduled appointments per 7/25 los. Patient is aware of the made appointments.

## 2023-06-09 NOTE — Progress Notes (Signed)
I discussed with radiology about patient's concern regarding the liver biopsy.  Previous core biopsy was done after getting Versed and fentanyl but apparently she was sedated enough and was very uncomfortable with the biopsy procedure and she did not want to do any such procedures again unless they gave her sufficient amount of medicine to make her more sedated.  Radiology was willing to give her more medication if her vitals would allow.  Apparently the last time her vitals are abnormal and therefore they could not give any more.  Considering all of these things, we decided to hold off on doing a biopsy at this time and wait for the Guardant360 test result to come and guide [place] for treatment planning.

## 2023-06-11 ENCOUNTER — Other Ambulatory Visit: Payer: Self-pay

## 2023-06-11 MED ORDER — AMPHETAMINE-DEXTROAMPHET ER 20 MG PO CP24: 20.0000 mg | ORAL_CAPSULE | Freq: Every day | ORAL | 0 refills | Status: DC

## 2023-06-11 NOTE — Telephone Encounter (Signed)
Patient contacted the office and states she would like a refill of Adderall sent to [facility] on [address]. Patient states she also recently had dental work done.   Last Fill: 05/12/2023  Next Visit: 07/02/2023  Last Visit: 01/30/2023  Dx: not mentioned   Current Dose per office note on 01/30/2023: not mentioned  Okay to refill Adderall?

## 2023-06-12 ENCOUNTER — Other Ambulatory Visit: Payer: Self-pay

## 2023-06-12 ENCOUNTER — Inpatient Hospital Stay: Payer: 59 | Attending: Oncology

## 2023-06-12 VITALS — BP 105/71 | HR 64 | Temp 98.3°F | Resp 18

## 2023-06-12 DIAGNOSIS — C7951 Secondary malignant neoplasm of bone: Secondary | ICD-10-CM | POA: Diagnosis not present

## 2023-06-12 DIAGNOSIS — C787 Secondary malignant neoplasm of liver and intrahepatic bile duct: Secondary | ICD-10-CM | POA: Diagnosis present

## 2023-06-12 DIAGNOSIS — Z170 Estrogen receptor positive status [ER+]: Secondary | ICD-10-CM | POA: Diagnosis not present

## 2023-06-12 DIAGNOSIS — Z5111 Encounter for antineoplastic chemotherapy: Secondary | ICD-10-CM | POA: Insufficient documentation

## 2023-06-12 DIAGNOSIS — C50511 Malignant neoplasm of lower-outer quadrant of right female breast: Secondary | ICD-10-CM | POA: Insufficient documentation

## 2023-06-12 DIAGNOSIS — Z95828 Presence of other vascular implants and grafts: Secondary | ICD-10-CM

## 2023-06-12 MED ORDER — FULVESTRANT 250 MG/5ML IM SOSY
500.0000 mg | PREFILLED_SYRINGE | Freq: Once | INTRAMUSCULAR | Status: AC
  Administered 2023-06-12: 500 mg via INTRAMUSCULAR
  Filled 2023-06-12: qty 10

## 2023-06-12 MED ORDER — DENOSUMAB 120 MG/1.7ML ~~LOC~~ SOLN
120.0000 mg | Freq: Once | SUBCUTANEOUS | Status: AC
  Administered 2023-06-12: 120 mg via SUBCUTANEOUS
  Filled 2023-06-12: qty 1.7

## 2023-06-12 NOTE — Patient Instructions (Signed)
Fulvestrant Injection What is this medication? FULVESTRANT (ful VES trant) treats breast cancer. It works by blocking the hormone estrogen in breast tissue, which prevents breast cancer cells from spreading or growing. This medicine may be used for other purposes; ask your health care provider or pharmacist if you have questions. COMMON BRAND NAME(S): FASLODEX What should I tell my care team before I take this medication? They need to know if you have any of these conditions: Bleeding disorder Liver disease Low blood cell levels, such as low white cells, red cells, and platelets An unusual or allergic reaction to fulvestrant, other medications, foods, dyes, or preservatives Pregnant or trying to get pregnant Breast-feeding How should I use this medication? This medication is injected into a muscle. It is given by your care team in a hospital or clinic setting. Talk to your care team about the use of this medication in children. Special care may be needed. Overdosage: If you think you have taken too much of this medicine contact a poison control center or emergency room at once. NOTE: This medicine is only for you. Do not share this medicine with others. What if I miss a dose? Keep appointments for follow-up doses. It is important not to miss your dose. Call your care team if you are unable to keep an appointment. What may interact with this medication? Certain medications that prevent or treat blood clots, such as warfarin, enoxaparin, dalteparin, apixaban, dabigatran, rivaroxaban This list may not describe all possible interactions. Give your health care provider a list of all the medicines, herbs, non-prescription drugs, or dietary supplements you use. Also tell them if you smoke, drink alcohol, or use illegal drugs. Some items may interact with your medicine. What should I watch for while using this medication? Your condition will be monitored carefully while you are receiving this  medication. You may need blood work while taking this medication. Talk to your care team if you may be pregnant. Serious birth defects can occur if you take this medication during pregnancy and for 1 year after the last dose. You will need a negative pregnancy test before starting this medication. Contraception is recommended while taking this medication and for 1 year after the last dose. Your care team can help you find the option that works for you. Do not breastfeed while taking this medication and for 1 year after the last dose. This medication may cause infertility. Talk to your care team if you are concerned about your fertility. What side effects may I notice from receiving this medication? Side effects that you should report to your care team as soon as possible: Allergic reactions or angioedema--skin rash, itching or hives, swelling of the face, eyes, lips, tongue, arms, or legs, trouble swallowing or breathing Pain, tingling, or numbness in the hands or feet Side effects that usually do not require medical attention (report to your care team if they continue or are bothersome): Bone, joint, or muscle pain Constipation Headache Hot flashes Nausea Pain, redness, or irritation at injection site Unusual weakness or fatigue This list may not describe all possible side effects. Call your doctor for medical advice about side effects. You may report side effects to FDA at 1-800-FDA-1088. Where should I keep my medication? This medication is given in a hospital or clinic. It will not be stored at home. NOTE: This sheet is a summary. It may not cover all possible information. If you have questions about this medicine, talk to your doctor, pharmacist, or health care provider.  Denosumab Injection (Oncology) What is this medication? DENOSUMAB (den oh SUE mab) prevents weakened bones caused by cancer. It may also be used to treat noncancerous bone tumors that cannot be removed by surgery. It can  also be used to treat high calcium levels in the blood caused by cancer. It works by blocking a protein that causes bones to break down quickly. This slows down the release of calcium from bones, which lowers calcium levels in your blood. It also makes your bones stronger and less likely to break (fracture). This medicine may be used for other purposes; ask your health care provider or pharmacist if you have questions. COMMON BRAND NAME(S): XGEVA What should I tell my care team before I take this medication? They need to know if you have any of these conditions: Dental disease Having surgery or tooth extraction Infection Kidney disease Low levels of calcium or vitamin D in the blood Malnutrition On hemodialysis Skin conditions or sensitivity Thyroid or parathyroid disease An unusual reaction to denosumab, other medications, foods, dyes, or preservatives Pregnant or trying to get pregnant Breast-feeding How should I use this medication? This medication is for injection under the skin. It is given by your care team in a hospital or clinic setting. A special MedGuide will be given to you before each treatment. Be sure to read this information carefully each time. Talk to your care team about the use of this medication in children. While it may be prescribed for children as young as 13 years for selected conditions, precautions do apply. Overdosage: If you think you have taken too much of this medicine contact a poison control center or emergency room at once. NOTE: This medicine is only for you. Do not share this medicine with others. What if I miss a dose? Keep appointments for follow-up doses. It is important not to miss your dose. Call your care team if you are unable to keep an appointment. What may interact with this medication? Do not take this medication with any of the following: Other medications containing denosumab This medication may also interact with the following: Medications  that lower your chance of fighting infection Steroid medications, such as prednisone or cortisone This list may not describe all possible interactions. Give your health care provider a list of all the medicines, herbs, non-prescription drugs, or dietary supplements you use. Also tell them if you smoke, drink alcohol, or use illegal drugs. Some items may interact with your medicine. What should I watch for while using this medication? Your condition will be monitored carefully while you are receiving this medication. You may need blood work while taking this medication. This medication may increase your risk of getting an infection. Call your care team for advice if you get a fever, chills, sore throat, or other symptoms of a cold or flu. Do not treat yourself. Try to avoid being around people who are sick. You should make sure you get enough calcium and vitamin D while you are taking this medication, unless your care team tells you not to. Discuss the foods you eat and the vitamins you take with your care team. Some people who take this medication have severe bone, joint, or muscle pain. This medication may also increase your risk for jaw problems or a broken thigh bone. Tell your care team right away if you have severe pain in your jaw, bones, joints, or muscles. Tell your care team if you have any pain that does not go away or that gets worse.  Talk to your care team if you may be pregnant. Serious birth defects can occur if you take this medication during pregnancy and for 5 months after the last dose. You will need a negative pregnancy test before starting this medication. Contraception is recommended while taking this medication and for 5 months after the last dose. Your care team can help you find the option that works for you. What side effects may I notice from receiving this medication? Side effects that you should report to your care team as soon as possible: Allergic reactions--skin rash,  itching, hives, swelling of the face, lips, tongue, or throat Bone, joint, or muscle pain Low calcium level--muscle pain or cramps, confusion, tingling, or numbness in the hands or feet Osteonecrosis of the jaw--pain, swelling, or redness in the mouth, numbness of the jaw, poor healing after dental work, unusual discharge from the mouth, visible bones in the mouth Side effects that usually do not require medical attention (report to your care team if they continue or are bothersome): Cough Diarrhea Fatigue Headache Nausea This list may not describe all possible side effects. Call your doctor for medical advice about side effects. You may report side effects to FDA at 1-800-FDA-1088. Where should I keep my medication? This medication is given in a hospital or clinic. It will not be stored at home. NOTE: This sheet is a summary. It may not cover all possible information. If you have questions about this medicine, talk to your doctor, pharmacist, or health care provider.  2024 Elsevier/Gold Standard (2022-03-20 00:00:00)   2024 Elsevier/Gold Standard (2022-03-12 00:00:00)

## 2023-06-17 ENCOUNTER — Encounter: Payer: Self-pay | Admitting: Hematology and Oncology

## 2023-06-17 ENCOUNTER — Telehealth: Payer: Self-pay

## 2023-06-17 NOTE — Progress Notes (Signed)
Faxed pt's labs to [facility] at [facility] [phone]. Fax confirmation received.

## 2023-06-17 NOTE — Telephone Encounter (Signed)
Notified Patient of completion of Leave Forms. Fax transmission confirmation received. Copy of forms placed for pick-up as requested by Patient. No other needs or concerns noted at this time.

## 2023-06-18 NOTE — Progress Notes (Unsigned)
Office Visit Note  Patient: Erica Wood             Date of Birth: 1966/01/16           MRN: [id number]             PCP: Lavada Mesi, MD Referring: Serena Croissant, MD Visit Date: 07/02/2023 Occupation: @GUAROCC @  Subjective:  Pain in both hands   History of Present Illness: Trinidi Drewek is a 57 y.o. female with history of systemic lupus and inflammatory arthritis. Patient remains on plaquenil 200 mg 1 tablet by mouth daily.  She is tolerating Actonel without any side effects and has not missed any doses recently.  Patient continues to have pain and stiffness involving both hands and both knee joints.  Patient has ongoing swelling in the left knee joint.  She denies any other signs or symptoms of a systemic lupus flare.  She is occasional sores in her nose but no mouth sores.  She has not had any recent rashes.  She has intermittent symptoms of Raynaud's phenomenon but no digital ulcerations.  She denies any sicca symptoms.  Patient remains under the care of Dr. Pamelia Hoit for management of malignant right breast cancer.  She recently had an updated PET/CT on 06/02/2023 which revealed interval progression of hepatic metastases as well as progression of bone metastases.  There is also suspicion for peritoneal disease.  She will be initiating xeloda twice daily for 14 days on 7 days off and will be following up with Dr. Pamelia Hoit in 2 weeks to have updated lab work.   Activities of Daily Living:  Patient reports morning stiffness for 10 minutes.   Patient Denies nocturnal pain.  Difficulty dressing/grooming: Denies Difficulty climbing stairs: Reports Difficulty getting out of chair: Denies Difficulty using hands for taps, buttons, cutlery, and/or writing: Reports  Review of Systems  Constitutional:  Positive for fatigue.  HENT:  Negative for mouth sores and mouth dryness.   Eyes:  Negative for dryness.  Respiratory:  Negative for shortness of breath.   Cardiovascular:   Negative for chest pain and palpitations.  Gastrointestinal:  Positive for constipation. Negative for blood in stool and diarrhea.  Endocrine: Negative for increased urination.  Genitourinary:  Negative for involuntary urination.  Musculoskeletal:  Positive for joint pain, joint pain, joint swelling, muscle weakness and morning stiffness. Negative for gait problem, myalgias, muscle tenderness and myalgias.  Skin:  Positive for color change and hair loss. Negative for rash and sensitivity to sunlight.  Allergic/Immunologic: Negative for susceptible to infections.  Neurological:  Negative for dizziness and headaches.  Hematological:  Negative for swollen glands.  Psychiatric/Behavioral:  Negative for depressed mood and sleep disturbance. The patient is nervous/anxious.     PMFS History:  Patient Active Problem List   Diagnosis Date Noted  . Monoallelic mutation of ATM gene 69/62/9528  . Genetic testing 02/04/2023  . Goals of care, counseling/discussion 05/09/2021  . Malignant neoplasm metastatic to liver (HCC) 01/03/2021  . Port-A-Cath in place 11/02/2020  . Aortic atherosclerosis (HCC) 09/21/2020  . Malignant neoplasm of lower-outer quadrant of right breast of female, estrogen receptor positive (HCC) 08/10/2020  . Abnormal cervical Papanicolaou smear 07/12/2020  . Pulmonary embolism (HCC) 07/12/2020  . Fibrocystic disease of breast 01/22/2018  . Primary osteoarthritis of both knees 05/02/2017  . Raynaud's disease without gangrene 11/26/2016  . 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 Anticardiolipin antibody positive 11/26/2016  . Autoimmune disease (HCC) 11/25/2016  . High risk medication use 11/25/2016  .  Primary osteoarthritis of both hands 11/25/2016  . History of DVT (deep vein thrombosis) 11/25/2016  . History of pulmonary embolism 11/25/2016  . Primary insomnia 11/25/2016  . Anxiety 11/25/2016  . Irritable bowel syndrome with constipation 11/25/2016  . Raynaud's phenomenon 01/03/2016   . Varicose veins of lower extremities with other complications 05/24/2014  . Spider veins of both lower extremities 05/24/2014    Past Medical History:  Diagnosis Date  . Anti-cardiolipin antibody positive   . Anti-cardiolipin antibody syndrome (HCC)   . Anxiety   . Arthralgia   . Arthritis    bil knees, hands  . Atypical chest pain   . Autoimmune disease (HCC)   . Autoimmune disease (HCC) 11/25/2016  . Breast cancer metastasized to liver (HCC) 09/2020  . Cancer (HCC) 07/2020   right breast IDC  . Chronic fatigue   . Depression   . DVT (deep venous thrombosis) (HCC)   . IBS (irritable bowel syndrome)    with constipation  . Lupus (HCC)   . Lymphedema    bilateral legs per patient  . Monoallelic mutation of ATM gene 40/98/1191  . Pulmonary embolus (HCC)   . Raynaud's syndrome   . Sicca (HCC)   . Thrombocytopenia (HCC)     Family History  Problem Relation Age of Onset  . Heart attack Mother   . Rheum arthritis Father   . Hemachromatosis Father   . Heart Problems Father   . Cancer - Other Sister        Ewing sarcoma; d. 18  . Lung cancer Maternal Aunt        smoking hx; 94s  . Cancer Paternal Grandmother 50       unknown type  . Colon cancer Neg Hx   . Stomach cancer Neg Hx   . Esophageal cancer Neg Hx   . Pancreatic cancer Neg Hx   . Liver disease Neg Hx    Past Surgical History:  Procedure Laterality Date  . ABLATION  12/2018   leg veins  . ABLATION Right 08/2019   venous leg   . APPENDECTOMY    . COLON SURGERY     Perforated colon repair after colostomy  . EYE SURGERY Bilateral 2022  . GANGLION CYST EXCISION    . HYSTEROSCOPY WITH D & C N/A 01/13/2014   Procedure: DILATATION AND CURETTAGE /HYSTEROSCOPY with resection ;  Surgeon: Levi Aland, MD;  Location: [facility];  Service: Gynecology;  Laterality: N/A;  . LAPAROTOMY    . PORTACATH PLACEMENT N/A 09/13/2020   Procedure: INSERTION PORT-A-CATH WITH ULTRASOUND GUIDANCE;  Surgeon: Emelia Loron,  MD;  Location: [facility];  Service: General;  Laterality: N/A;   Social History   Social History Narrative  . Not on file   Immunization History  Administered Date(s) Administered  . PFIZER Comirnaty(Gray Top)Covid-19 Tri-Sucrose Vaccine 08/11/2020     Objective: Vital Signs: BP 102/64 (BP Location: Left Arm, Patient Position: Sitting, Cuff Size: Normal)   Pulse 65   Resp 14   Ht 5\' 4"  (1.626 m)   BMI 19.21 kg/m    Physical Exam Vitals and nursing note reviewed.  Constitutional:      Appearance: She is well-developed.  HENT:     Head: Normocephalic and atraumatic.  Eyes:     Conjunctiva/sclera: Conjunctivae normal.  Cardiovascular:     Rate and Rhythm: Normal rate and regular rhythm.     Heart sounds: Normal heart sounds.  Pulmonary:     Effort: Pulmonary  effort is normal.     Breath sounds: Normal breath sounds.  Abdominal:     General: Bowel sounds are normal.     Palpations: Abdomen is soft.  Musculoskeletal:     Cervical back: Normal range of motion.  Lymphadenopathy:     Cervical: No cervical adenopathy.  Skin:    General: Skin is warm and dry.     Capillary Refill: Capillary refill takes less than 2 seconds.  Neurological:     Mental Status: She is alert and oriented to person, place, and time.  Psychiatric:        Behavior: Behavior normal.     Musculoskeletal Exam: C-spine, thoracic spine, and lumbar spine good ROM.  Shoulder joints, elbow joints, wrist joints have good range of motion.  Synovial thickening over bilateral second and third MCP joints.  Tenderness and mild inflammation noted in the left first MCP joint.  PIP and DIP thickening noted.  Hip joints have good range of motion with no groin pain.  Painful range of motion of both knee joints.  Fullness noted in the left knee.  Ankle joints have good range of motion with no tenderness or joint swelling.  CDAI Exam: CDAI Score: -- Patient Global: --; Provider Global: -- Swollen: --;  Tender: -- Joint Exam 07/02/2023   No joint exam has been documented for this visit   There is currently no information documented on the homunculus. Go to the Rheumatology activity and complete the homunculus joint exam.  Investigation: No additional findings.  Imaging: No results found.  Recent Labs: Lab Results  Component Value Date   WBC 4.0 07/01/2023   HGB 13.1 07/01/2023   PLT 122 (L) 07/01/2023   NA 137 07/01/2023   K 4.3 07/01/2023   CL 104 07/01/2023   CO2 29 07/01/2023   GLUCOSE 91 07/01/2023   BUN 19 07/01/2023   CREATININE 1.08 (H) 07/01/2023   BILITOT 0.4 07/01/2023   ALKPHOS 108 07/01/2023   AST 24 07/01/2023   ALT 17 07/01/2023   PROT 6.7 07/01/2023   ALBUMIN 4.1 07/01/2023   CALCIUM 8.9 07/01/2023   GFRAA 95 07/28/2020   QFTBGOLDPLUS Negative 12/19/2021    Speciality Comments: PLQ Eye Exam: 04/23/2023 WNL @ [facility] follow up in 1 year. stage IB invasive ductal carcinoma, grade 3 08/16/20  Procedures:  No procedures performed Allergies: Beef-derived products, Latex, Sulfa antibiotics, and Tape   Assessment / Plan:     Visit Diagnoses: SLE (systemic lupus erythematosus related syndrome) (HCC) - Positive ANA and positive double-stranded DNA, hypocomplementemia, inflammatory arthritis, raynauds phenomenon and discoid lupus: Patient has not had any signs or symptoms of a systemic lupus flare but continues to have bouts of inflammation due to underlying inflammatory arthritis.  She is taking Plaquenil 200 mg 1 tablet by mouth daily.  She is tolerating Plaquenil without any side effects and has not missed any doses recently. She has not had any recent rashes or increased hair loss.  No sicca symptoms.  Occasional nasal ulcers but no oral ulcers.  Infrequent symptoms of Raynaud's phenomenon recently.  Patient has not yet had updated autoimmune lab work.  Future orders for autoimmune lab work remain in place for [employer] [facility].  Patient was encouraged to  have lab work drawn at her convenience. She will notify us if she develops any signs or symptoms of a flare.  She will follow-up in the office in 5 months or sooner if needed.  Chronic inflammatory arthritis - RF negative: Patient has  tenderness and synovitis over the left first MCP joint today.  Thickening over bilateral second and third MCP joints with mild tenderness but no active synovitis noted.  Patient is taking Plaquenil 200 mg 1 tablet by mouth daily.  She is tolerating Plaquenil without any side effects.  Patient will remain on Plaquenil as prescribed.  She was advised to notify us if she develops any new or worsening symptoms.  High risk medication use - Plaquenil 200 mg 1 tablet by mouth daily.  CBC and CMP updated on 07/02/23-reviewed results with the patient today in the office.  She will be having updated lab work in 2 weeks ordered by Dr. Pamelia Hoit. PLQ Eye Exam: 04/23/2023 WNL @ [facility] follow up in 1 year.   Discoid lupus: No recurrence.   Anticardiolipin antibody positive: History of DVT and PE- patient remains on warfarin as prescribed.  Malignant neoplasm of lower-outer quadrant of right breast of female, estrogen receptor positive [facility]): Patient remains under the care of Dr. Pamelia Hoit.  Patient had an updated PET/CT on 06/02/2023 which revealed interval progression of hepatic metastasis as well as progression of bone metastasis.  There is also suspicion for peritoneal disease.  She will be initiating Xeloda 1000 g twice daily 14 days on and 7 days off.  She is scheduled to follow back up with Dr. Pamelia Hoit in 2 weeks.   Raynaud's disease without gangrene: She continues to have intermittent symptoms of Raynaud's phenomenon.  No digital ulcerations or signs of gangrene were noted on examination today.  Discussed the importance of keeping her core body temperature warm and wearing gloves and socks especially with seasonal changes.  Primary osteoarthritis of both hands: PIP and  DIP thickening consistent with osteoarthritis of both hands.  Discussed the use of arthritis compression gloves, paraffin wax, and hand exercises.   Primary osteoarthritis of both knees: Painful range of motion of both knee joints especially in the left knee.  Fullness in the left knee joint noted.  Discussed the use of his arthritis compression sleeve or brace.   Vitamin D deficiency: She is taking vitamin D 3000 units daily.  Other osteoporosis without current pathological fracture - DEXA updated on 05/10/2021: AP spine BMD 0.761 with T score -2.6.  She has been taking vitamin D 3000 units daily. Due to update DEXA.   Other medical conditions are listed as follows:  History of pulmonary embolism: She remains on warfarin as prescribed.  History of DVT (deep vein thrombosis): She remains on warfarin as prescribed.  History of IBS  History of anxiety  Port-A-Cath in place  Family history of hemochromatosis  Primary insomnia  Orders: No orders of the defined types were placed in this encounter.  No orders of the defined types were placed in this encounter.     Follow-Up Instructions: Return in about 5 months (around 12/02/2023) for Systemic lupus erythematosus.   Gearldine Bienenstock, PA-C  Note - This record has been created using Dragon software.  Chart creation errors have been sought, but may not always  have been located. Such creation errors do not reflect on  the standard of medical care.

## 2023-07-01 ENCOUNTER — Encounter: Payer: Self-pay | Admitting: Hematology and Oncology

## 2023-07-01 ENCOUNTER — Telehealth: Payer: Self-pay

## 2023-07-01 ENCOUNTER — Inpatient Hospital Stay: Payer: 59 | Admitting: Hematology and Oncology

## 2023-07-01 ENCOUNTER — Other Ambulatory Visit: Payer: Self-pay

## 2023-07-01 ENCOUNTER — Inpatient Hospital Stay: Payer: 59

## 2023-07-01 ENCOUNTER — Telehealth: Payer: Self-pay | Admitting: Pharmacy Technician

## 2023-07-01 ENCOUNTER — Other Ambulatory Visit (HOSPITAL_COMMUNITY): Payer: Self-pay

## 2023-07-01 VITALS — BP 119/46 | HR 70 | Temp 97.5°F | Resp 18 | Ht 64.0 in | Wt 111.9 lb

## 2023-07-01 DIAGNOSIS — C50511 Malignant neoplasm of lower-outer quadrant of right female breast: Secondary | ICD-10-CM

## 2023-07-01 DIAGNOSIS — Z86718 Personal history of other venous thrombosis and embolism: Secondary | ICD-10-CM

## 2023-07-01 DIAGNOSIS — Z170 Estrogen receptor positive status [ER+]: Secondary | ICD-10-CM

## 2023-07-01 DIAGNOSIS — Z5111 Encounter for antineoplastic chemotherapy: Secondary | ICD-10-CM | POA: Diagnosis not present

## 2023-07-01 LAB — PROTIME-INR
INR: 4.4 (ref 0.8–1.2)
Prothrombin Time: 42.2 seconds — ABNORMAL HIGH (ref 11.4–15.2)

## 2023-07-01 LAB — CBC WITH DIFFERENTIAL (CANCER CENTER ONLY)
Abs Immature Granulocytes: 0.01 10*3/uL (ref 0.00–0.07)
Basophils Absolute: 0.1 10*3/uL (ref 0.0–0.1)
Basophils Relative: 2 %
Eosinophils Absolute: 0 10*3/uL (ref 0.0–0.5)
Eosinophils Relative: 1 %
HCT: 40.2 % (ref 36.0–46.0)
Hemoglobin: 13.1 g/dL (ref 12.0–15.0)
Immature Granulocytes: 0 %
Lymphocytes Relative: 42 %
Lymphs Abs: 1.7 10*3/uL (ref 0.7–4.0)
MCH: 34.1 pg — ABNORMAL HIGH (ref 26.0–34.0)
MCHC: 32.6 g/dL (ref 30.0–36.0)
MCV: 104.7 fL — ABNORMAL HIGH (ref 80.0–100.0)
Monocytes Absolute: 0.3 10*3/uL (ref 0.1–1.0)
Monocytes Relative: 6 %
Neutro Abs: 1.9 10*3/uL (ref 1.7–7.7)
Neutrophils Relative %: 49 %
Platelet Count: 122 10*3/uL — ABNORMAL LOW (ref 150–400)
RBC: 3.84 MIL/uL — ABNORMAL LOW (ref 3.87–5.11)
RDW: 13.2 % (ref 11.5–15.5)
WBC Count: 4 10*3/uL (ref 4.0–10.5)
nRBC: 0 % (ref 0.0–0.2)

## 2023-07-01 LAB — CMP (CANCER CENTER ONLY)
ALT: 17 U/L (ref 0–44)
AST: 24 U/L (ref 15–41)
Albumin: 4.1 g/dL (ref 3.5–5.0)
Alkaline Phosphatase: 108 U/L (ref 38–126)
Anion gap: 4 — ABNORMAL LOW (ref 5–15)
BUN: 19 mg/dL (ref 6–20)
CO2: 29 mmol/L (ref 22–32)
Calcium: 8.9 mg/dL (ref 8.9–10.3)
Chloride: 104 mmol/L (ref 98–111)
Creatinine: 1.08 mg/dL — ABNORMAL HIGH (ref 0.44–1.00)
GFR, Estimated: >60 mL/min (ref >60)
Glucose, Bld: 91 mg/dL (ref 70–99)
Potassium: 4.3 mmol/L (ref 3.5–5.1)
Sodium: 137 mmol/L (ref 135–145)
Total Bilirubin: 0.4 mg/dL (ref 0.3–1.2)
Total Protein: 6.7 g/dL (ref 6.5–8.1)

## 2023-07-01 MED ORDER — CAPECITABINE 500 MG PO TABS
625.0000 mg/m2 | ORAL_TABLET | Freq: Two times a day (BID) | ORAL | 6 refills | Status: DC
  Filled 2023-07-01: qty 56, 14d supply, fill #0

## 2023-07-01 NOTE — Progress Notes (Unsigned)
INR = 4.4.  Called to Erica Wood at 1556 on 20Aug24. [facility]

## 2023-07-01 NOTE — Telephone Encounter (Signed)
CRITICAL VALUE STICKER  CRITICAL VALUE: INR 4.4   RECEIVER (on-site recipient of call): Vernona Rieger, LPN  DATE & TIME NOTIFIED: 07/01/23 3:53 PM  MESSENGER (representative from lab): Herbert Seta  MD NOTIFIED: Pamelia Hoit  TIME OF NOTIFICATION: 3:53  RESPONSE: Pt should hold x3 days and call Dr Oleta Mouse office to advise.   Called pt and advised MD recommendation. She will contact Dr Oleta Mouse office and send [place] a mychart message to let us know what they advise.

## 2023-07-01 NOTE — Progress Notes (Signed)
Patient Care Team: Lavada Mesi, MD as PCP - General (Family Medicine) Peggyann Shoals, Leda Roys, MD as Referring Physician (Oncology) Donnelly Angelica, RN as Oncology Nurse Navigator Pershing Proud, RN as Oncology Nurse Navigator Emelia Loron, MD as Consulting Physician (General Surgery) Dorothy Puffer, MD as Consulting Physician (Radiation Oncology) Pollyann Savoy, MD as Consulting Physician (Rheumatology) Levi Aland, MD as Consulting Physician (Obstetrics and Gynecology) Laurey Morale, MD as Consulting Physician (Cardiology) Armbruster, Willaim Rayas, MD as Consulting Physician (Gastroenterology) Roberts Gaudy, MD as Referring Physician (Ophthalmology) Roberts Gaudy, MD as Referring Physician (Ophthalmology) Serena Croissant, MD as Consulting Physician (Hematology and Oncology) Anselm Lis, RPH-CPP as Pharmacist (Hematology and Oncology)  DIAGNOSIS:  Encounter Diagnosis  Name Primary?   Malignant neoplasm of lower-outer quadrant of right breast of female, estrogen receptor positive (HCC) Yes    SUMMARY OF ONCOLOGIC HISTORY: Oncology History  Malignant neoplasm of lower-outer quadrant of right breast of female, estrogen receptor positive (HCC)  08/04/2020 Initial Diagnosis   Right breast lower outer quadrant biopsy 08/04/2020 for a clinical T2 N0 M1, stage IV invasive ductal carcinoma, grade 3, triple positive, with an MIB-1 of 40%. abdominal MRI 08/24/2020 shows multiple liver lesions highly suspicious for liver metastases. Liver biopsy 09/20/2020 positive for carcinoma, prognostic panel estrogen and progesterone receptor positive, but HER-2 negative (0); MIB-1 of 30%   08/31/2020 - 12/14/2020 Chemotherapy   Neoadjuvant chemotherapy consisting of docetaxel, carboplatin, trastuzumab and pertuzumab every 21 days x 6    12/26/2020 Imaging   breast MRI 12/26/2020 showed right breast index lesion decreased to 0.8 cm; 2 additional masses in the right breast  stable abdominal MRI 01/29/2021 shows no active disease in the liver    Chemotherapy   Herceptin Maintenance initially q 3 weeks, now q 4 weeks   01/29/2021 -  Anti-estrogen oral therapy   anastrozole and palbociclib    01/29/2023 Genetic Testing   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  Report date is 01/29/2023.   The Multi-Cancer + RNA Panel offered by [facility] includes sequencing and/or deletion/duplication analysis of the following 70 genes:  AIP*, ALK, APC*, ATM*, AXIN2*, BAP1*, BARD1*, BLM*, BMPR1A*, BRCA1*, BRCA2*, BRIP1*, CDC73*, CDH1*, CDK4, CDKN1B*, CDKN2A, CHEK2*, CTNNA1*, DICER1*, EPCAM (del/dup only), EGFR, FH*, FLCN*, GREM1 (promoter dup only), HOXB13, KIT, LZTR1, MAX*, MBD4, MEN1*, MET, MITF, MLH1*, MSH2*, MSH3*, MSH6*, MUTYH*, NF1*, NF2*, NTHL1*, PALB2*, PDGFRA, PMS2*, POLD1*, POLE*, POT1*, PRKAR1A*, PTCH1*, PTEN*, RAD51C*, RAD51D*, RB1*, RET, SDHA* (sequencing only), SDHAF2*, SDHB*, SDHC*, SDHD*, SMAD4*, SMARCA4*, SMARCB1*, SMARCE1*, STK11*, SUFU*, TMEM127*, TP53*, TSC1*, TSC2*, VHL*. RNA analysis is performed for * genes.   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  Report date is 01/29/2023.   The Multi-Cancer + RNA Panel offered by [facility] includes sequencing and/or deletion/duplication analysis of the following 70 genes:  AIP*, ALK, APC*, ATM*, AXIN2*, BAP1*, BARD1*, BLM*, BMPR1A*, BRCA1*, BRCA2*, BRIP1*, CDC73*, CDH1*, CDK4, CDKN1B*, CDKN2A, CHEK2*, CTNNA1*, DICER1*, EPCAM (del/dup only), EGFR, FH*, FLCN*, GREM1 (promoter dup only), HOXB13, KIT, LZTR1, MAX*, MBD4, MEN1*, MET, MITF, MLH1*, MSH2*, MSH3*, MSH6*, MUTYH*, NF1*, NF2*, NTHL1*, PALB2*, PDGFRA, PMS2*, POLD1*, POLE*, POT1*, PRKAR1A*, PTCH1*, PTEN*,  RAD51C*, RAD51D*, RB1*, RET, SDHA* (sequencing only), SDHAF2*, SDHB*, SDHC*, SDHD*, SMAD4*, SMARCA4*, SMARCB1*, SMARCE1*, STK11*, SUFU*, NGEX528*,  TP53*, TSC1*, TSC2*, VHL*. RNA analysis is performed for * genes.     CHIEF COMPLIANT: Follow-up of scans   INTERVAL HISTORY: Erica Wood is a 57 y.o. with above-mentioned history of  Metastatic breast cancer who progressed on Herceptin and olaparib. She presents to the clinic for a follow-up. Patient is tired more.   ALLERGIES:  is allergic to beef-derived products, latex, sulfa antibiotics, and tape.  MEDICATIONS:  Current Outpatient Medications  Medication Sig Dispense Refill   abemaciclib (VERZENIO) 100 MG tablet Take 1 tablet (100 mg total) by mouth 2 (two) times daily. 60 tablet 3   acetaminophen (TYLENOL) 500 MG tablet Take 1,000 mg by mouth every 6 (six) hours as needed for moderate pain or headache.     amphetamine-dextroamphetamine (ADDERALL XR) 20 MG 24 hr capsule Take 1 capsule (20 mg total) by mouth daily. 30 capsule 0   anastrozole (ARIMIDEX) 1 MG tablet Take 1 tablet (1 mg total) by mouth daily. 90 tablet 3   Ascorbic Acid (VITAMIN C PO) Take 3,000 mg by mouth daily.     ascorbic acid (VITAMIN C) 500 MG tablet Take by mouth.     capecitabine (XELODA) 500 MG tablet Take 2 tablets (1,000 mg total) by mouth 2 (two) times daily after a meal. Take 2 tabs twice daily for 14 days and then off for 7 days 56 tablet 6   Cholecalciferol (VITAMIN D3) 75 MCG (3000 UT) TABS Take 3,000 Units by mouth daily.     Clindamycin-Benzoyl Per, Refr, gel Apply topically.     estradiol (ESTRACE) 0.1 MG/GM vaginal cream INSERT 1 APPLICATORFUL VAGINALLY 3 TIMES A WEEK 42.5 g 12   famotidine (PEPCID) 20 MG tablet Take 20 mg by mouth at bedtime.     hydroxychloroquine (PLAQUENIL) 200 MG tablet TAKE 1 TABLET(200 MG) BY MOUTH DAILY 90 tablet 0   letrozole (FEMARA) 2.5 MG tablet Take 1 tablet (2.5 mg total) by mouth daily. 90 tablet 3    lidocaine-prilocaine (EMLA) cream Apply topically.     LORazepam (ATIVAN) 0.5 MG tablet Take 1 tablet (0.5 mg total) by mouth 2 (two) times daily. Take prior to scan for claustrophobia. 2 tablet 0   metoCLOPramide (REGLAN) 10 MG tablet Take 1 tablet (10 mg total) by mouth every 4 (four) hours as needed for nausea or vomiting. 30 tablet 3   nystatin-triamcinolone ointment (MYCOLOG) Apply topically.     ondansetron (ZOFRAN) 8 MG tablet Take 1 tablet (8 mg total) by mouth every 8 (eight) hours as needed for nausea or vomiting. 30 tablet 6   spironolactone (ALDACTONE) 25 MG tablet Take 25 mg by mouth daily as needed (swelling).     traMADol (ULTRAM) 50 MG tablet Take 1-2 tablets (50-100 mg total) by mouth every 6 (six) hours as needed. 60 tablet 0   tretinoin (RETIN-A) 0.1 % cream Apply 1 application  topically at bedtime as needed (acne).     triamterene-hydrochlorothiazide (DYAZIDE) 37.5-25 MG capsule TAKE 1 CAPSULE BY MOUTH DAILY 90 capsule 1   warfarin (COUMADIN) 5 MG tablet TAKE 1 TABLET(5 MG) BY MOUTH DAILY 30 tablet PRN   No current facility-administered medications for this visit.    PHYSICAL EXAMINATION: ECOG PERFORMANCE STATUS: 1 - Symptomatic but completely ambulatory  Vitals:   07/01/23 1330  BP: (!) 119/46  Pulse: 70  Resp: 18  Temp: (!) 97.5 F (36.4 C)  SpO2: 100%   Filed Weights   07/01/23 1330  Weight: 111 lb 14.4 oz (50.8 kg)  LABORATORY DATA:  I have reviewed the data as listed    Latest Ref Rng & Units 07/01/2023   12:54 PM 06/03/2023    3:39 PM 02/28/2023   12:37 PM  CMP  Glucose 70 - 99 mg/dL 91  76  92   BUN 6 - 20 mg/dL 19  17  18    Creatinine 0.44 - 1.00 mg/dL 1.61  0.96  0.45   Sodium 135 - 145 mmol/L 137  136  136   Potassium 3.5 - 5.1 mmol/L 4.3  4.1  3.9   Chloride 98 - 111 mmol/L 104  104  103   CO2 22 - 32 mmol/L 29  29  30    Calcium 8.9 - 10.3 mg/dL 8.9  9.3  9.1   Total Protein 6.5 - 8.1 g/dL 6.7  6.4  6.4   Total Bilirubin 0.3 - 1.2  mg/dL 0.4  0.4  0.4   Alkaline Phos 38 - 126 U/L 108  134  104   AST 15 - 41 U/L 24  41  27   ALT 0 - 44 U/L 17  27  19      Lab Results  Component Value Date   WBC 4.0 07/01/2023   HGB 13.1 07/01/2023   HCT 40.2 07/01/2023   MCV 104.7 (H) 07/01/2023   PLT 122 (L) 07/01/2023   NEUTROABS 1.9 07/01/2023    ASSESSMENT & PLAN:  Malignant neoplasm of lower-outer quadrant of right breast of female, estrogen receptor positive (HCC) 08/04/2020: Right breast biopsy T2 N0 M1 stage IV IDC grade 3, triple positive with a Ki-67 of 40%, multiple liver lesions biopsy was positive for breast cancer ER/PR positive HER2 negative with a Ki-67 30% 08/31/2020-12/14/2020: TCHP x6 01/29/2021: Letrozole and palbociclib   11/29/2022-06/05/2023: Olaparib   10/24/2022: CT CAP: Multiple new hepatic lesions measuring up to 1.5 cm consistent with metastatic disease, stable 5 mm nodule left costophrenic sulcus 12/05/2021: Guardant360: ATM gene mutation (possible treatment option olaparib), MSI high: Not detected.  Will refer the patient to genetic testing   Lab review: CA 27-29: 1365 it was 1159 01/28/2023 (was 1049 and previously it was 385 on 10/16/2022)   PET CT scan 03/03/2023: Widespread hypermetabolic metastatic disease within the liver is similar to previous PET/CT, some of the lesions have mildly decreased in size and metabolic activity consistent with response to treatment.  Small lytic lesions right acromion and right inferior pubic ramus PET/CT 06/02/2023: Interval progression of hepatic metastases.  Progression of bone metastases.  Suspicion of peritoneal disease.  06/13/2023: Guardant360: ATM, T p53, FGF R1 amplification, TMB 4.73 mutations  Recommendation: Xeloda 1000 g p.o. twice daily 14 days on 7 days off. Discussed risks and benefits of Xeloda and patient understands and is willing to proceed.  Return to clinic in 2 weeks for follow-up to assess tolerance to Xeloda.  (Other options include Enhertu and  Faslodex with Verzinio were also discussed)   No orders of the defined types were placed in this encounter.  The patient has a good understanding of the overall plan. she agrees with it. she will call with any problems that may develop before the next visit here. Total time spent: 30 mins including face to face time and time spent for planning, charting and co-ordination of care   Tamsen Meek, MD 07/01/23    I Janan Ridge am acting as a Neurosurgeon for Dr.Markeisha Mancias  I have reviewed the above documentation for accuracy and completeness, and I agree with the  above.

## 2023-07-01 NOTE — Assessment & Plan Note (Addendum)
08/04/2020: Right breast biopsy T2 N0 M1 stage IV IDC grade 3, triple positive with a Ki-67 of 40%, multiple liver lesions biopsy was positive for breast cancer ER/PR positive HER2 negative with a Ki-67 30% 08/31/2020-12/14/2020: TCHP x6 01/29/2021: Letrozole and palbociclib   11/29/2022-06/05/2023: Olaparib   10/24/2022: CT CAP: Multiple new hepatic lesions measuring up to 1.5 cm consistent with metastatic disease, stable 5 mm nodule left costophrenic sulcus 12/05/2021: Guardant360: ATM gene mutation (possible treatment option olaparib), MSI high: Not detected.  Will refer the patient to genetic testing   Lab review: CA 27-29: 1365 it was 1159 01/28/2023 (was 1049 and previously it was 385 on 10/16/2022)   PET CT scan 03/03/2023: Widespread hypermetabolic metastatic disease within the liver is similar to previous PET/CT, some of the lesions have mildly decreased in size and metabolic activity consistent with response to treatment.  Small lytic lesions right acromion and right inferior pubic ramus PET/CT 06/02/2023: Interval progression of hepatic metastases.  Progression of bone metastases.  Suspicion of peritoneal disease.  06/13/2023: Guardant360: ATM, T p53, FGF R1 amplification, TMB 4.73 mutations  Recommendation: Xeloda 1000 g p.o. twice daily 14 days on 7 days off. Discussed risks and benefits of Xeloda and patient understands and is willing to proceed.  Return to clinic in 2 weeks for follow-up to assess tolerance to Xeloda.  (Other options include Enhertu and Faslodex with Verzinio were also discussed)

## 2023-07-02 ENCOUNTER — Ambulatory Visit: Payer: 59 | Attending: Physician Assistant | Admitting: Physician Assistant

## 2023-07-02 ENCOUNTER — Telehealth: Payer: Self-pay

## 2023-07-02 ENCOUNTER — Encounter: Payer: Self-pay | Admitting: Physician Assistant

## 2023-07-02 VITALS — BP 102/64 | HR 65 | Resp 14 | Ht 64.0 in

## 2023-07-02 DIAGNOSIS — L930 Discoid lupus erythematosus: Secondary | ICD-10-CM

## 2023-07-02 DIAGNOSIS — Z8719 Personal history of other diseases of the digestive system: Secondary | ICD-10-CM

## 2023-07-02 DIAGNOSIS — Z86711 Personal history of pulmonary embolism: Secondary | ICD-10-CM

## 2023-07-02 DIAGNOSIS — M818 Other osteoporosis without current pathological fracture: Secondary | ICD-10-CM

## 2023-07-02 DIAGNOSIS — I7300 Raynaud's syndrome without gangrene: Secondary | ICD-10-CM

## 2023-07-02 DIAGNOSIS — Z95828 Presence of other vascular implants and grafts: Secondary | ICD-10-CM

## 2023-07-02 DIAGNOSIS — R760 Raised antibody titer: Secondary | ICD-10-CM | POA: Diagnosis not present

## 2023-07-02 DIAGNOSIS — M19042 Primary osteoarthritis, left hand: Secondary | ICD-10-CM

## 2023-07-02 DIAGNOSIS — E559 Vitamin D deficiency, unspecified: Secondary | ICD-10-CM

## 2023-07-02 DIAGNOSIS — Z79899 Other long term (current) drug therapy: Secondary | ICD-10-CM | POA: Diagnosis not present

## 2023-07-02 DIAGNOSIS — Z8659 Personal history of other mental and behavioral disorders: Secondary | ICD-10-CM

## 2023-07-02 DIAGNOSIS — Z170 Estrogen receptor positive status [ER+]: Secondary | ICD-10-CM

## 2023-07-02 DIAGNOSIS — M329 Systemic lupus erythematosus, unspecified: Secondary | ICD-10-CM | POA: Diagnosis not present

## 2023-07-02 DIAGNOSIS — Z8349 Family history of other endocrine, nutritional and metabolic diseases: Secondary | ICD-10-CM

## 2023-07-02 DIAGNOSIS — M170 Bilateral primary osteoarthritis of knee: Secondary | ICD-10-CM

## 2023-07-02 DIAGNOSIS — F5101 Primary insomnia: Secondary | ICD-10-CM

## 2023-07-02 DIAGNOSIS — C50511 Malignant neoplasm of lower-outer quadrant of right female breast: Secondary | ICD-10-CM

## 2023-07-02 DIAGNOSIS — M1380 Other specified arthritis, unspecified site: Secondary | ICD-10-CM

## 2023-07-02 DIAGNOSIS — Z86718 Personal history of other venous thrombosis and embolism: Secondary | ICD-10-CM

## 2023-07-02 DIAGNOSIS — M19041 Primary osteoarthritis, right hand: Secondary | ICD-10-CM

## 2023-07-02 DIAGNOSIS — M1990 Unspecified osteoarthritis, unspecified site: Secondary | ICD-10-CM

## 2023-07-02 LAB — CANCER ANTIGEN 27.29: CA 27.29: 2894 U/mL — ABNORMAL HIGH (ref 0.0–38.6)

## 2023-07-02 NOTE — Telephone Encounter (Signed)
Pt called to let us know Dr Prince Rome recommends she hold coumadin today, then restart taking 7.5 mg 2 days a week and 5 mg 5 days a week. She will return to [facility] in 2 weeks and we will repeat.

## 2023-07-02 NOTE — Telephone Encounter (Signed)
Oral Oncology Pharmacist Encounter  Received new prescription for Xeloda (capecitabine) for the treatment of breast cancer, planned duration until disease progression or unacceptable toxicity.  Labs from 07/01/23 (CBC, CMP, INR) assessed, no interventions needed. Patient has CrCl of 47.2 based on creatinine of 1.08, dose already adjusted by MD to 625mg /m2 dosing. Prescription dose and frequency assessed for appropriateness. MD aware and wants to proceed despite interaction with INR and warfarin. Patient and MD know that they will keep a close eye on INR ranges.   Current medication list in Epic reviewed, DDIs with Xeloda identified: - metoclopramide and plaquenil: can increase potential for qtc prolongation in use with xeloda. No monitoring needed. Will notify MD in case they would like to get an EKG in the future. -warfarin: significant interaction with xeloda as it can increase the concentration of warfarin.   Evaluated chart and no patient barriers to medication adherence noted.   Patient agreement for treatment documented in MD note on 07/01/2023.  Prescription has been e-scribed to the [facility] for benefits analysis and approval.  Oral Oncology Clinic will continue to follow for insurance authorization, copayment issues, initial counseling and start date.  Bethel Born, PharmD Hematology/Oncology Clinical Pharmacist [facility] Oral Chemotherapy Navigation Clinic [phone] 07/02/2023 9:41 AM

## 2023-07-08 MED ORDER — CAPECITABINE 500 MG PO TABS
625.0000 mg/m2 | ORAL_TABLET | Freq: Two times a day (BID) | ORAL | 6 refills | Status: DC
Start: 2023-07-08 — End: 2023-11-08

## 2023-07-08 NOTE — Telephone Encounter (Signed)
Oral Oncology Patient Advocate Encounter  After completing a benefits investigation, prior authorization for capecitabine is not required at this time through [facility].  Patient must fill at [facility].     Erica Wood, CPhT-Adv Oncology Pharmacy Patient [place] Direct Number: [phone]  Fax: [phone]

## 2023-07-10 ENCOUNTER — Other Ambulatory Visit: Payer: Self-pay | Admitting: *Deleted

## 2023-07-10 MED ORDER — AMPHETAMINE-DEXTROAMPHET ER 20 MG PO CP24: 20.0000 mg | ORAL_CAPSULE | Freq: Every day | ORAL | 0 refills | Status: DC

## 2023-07-10 NOTE — Telephone Encounter (Signed)
Patient contacted the office to request a medication refill.   1. Name of Medication: Adderall  2. How are you currently taking this medication (dosage and times per day)? 20 mg po daily   3. What pharmacy would you like for that to be sent to? [facility]- [address]

## 2023-07-10 NOTE — Telephone Encounter (Signed)
Last Fill: 06/11/2023  Next Visit: 10/30/2023  Last Visit: 07/02/2023  Dx: not discussed  Current Dose per office note on 07/02/2023: not discussed  Okay to refill Adderall?

## 2023-07-11 ENCOUNTER — Other Ambulatory Visit: Payer: Self-pay

## 2023-07-11 ENCOUNTER — Ambulatory Visit (HOSPITAL_COMMUNITY)
Admission: RE | Admit: 2023-07-11 | Discharge: 2023-07-11 | Disposition: A | Discharge status: Home / Self Care | Payer: 59 | Source: Ambulatory Visit | Attending: Hematology and Oncology | Admitting: Hematology and Oncology

## 2023-07-11 DIAGNOSIS — Z170 Estrogen receptor positive status [ER+]: Secondary | ICD-10-CM | POA: Diagnosis present

## 2023-07-11 DIAGNOSIS — C50511 Malignant neoplasm of lower-outer quadrant of right female breast: Secondary | ICD-10-CM | POA: Insufficient documentation

## 2023-07-11 MED ORDER — SODIUM CHLORIDE (PF) 0.9 % IJ SOLN
INTRAMUSCULAR | Status: AC
  Filled 2023-07-11: qty 50

## 2023-07-11 MED ORDER — IOHEXOL 9 MG/ML PO SOLN
1000.0000 mL | ORAL | Status: AC
  Administered 2023-07-11: 1000 mL via ORAL

## 2023-07-11 MED ORDER — IOHEXOL 9 MG/ML PO SOLN
ORAL | Status: AC
  Filled 2023-07-11: qty 1000

## 2023-07-11 MED ORDER — IOHEXOL 300 MG/ML  SOLN
100.0000 mL | Freq: Once | INTRAMUSCULAR | Status: AC | PRN
  Administered 2023-07-11: 100 mL via INTRAVENOUS

## 2023-07-11 NOTE — Progress Notes (Signed)
Research Note - Consent Authorization   Study Name: Evaluation of Topical Diclofenac Use on the Incidence and Severity of Hand Foot Syndrome in Patients with Breast and Gastrointestinal Malignancies Taking Capecitabine   IRB# [id number]   A summary of the study was presented to the patient, including potential risks and benefits from study participation, alternatives treatment options available other than study participation, how their confidentiality will be maintained, and who to contact if they had questions regarding their rights as a study participant or if they experience an injury associated with their participation. They were told that participation is completely voluntary and choosing not to participate would not impact care they would otherwise have access to. They were also told that even if they choose to participate, they can withdraw their consent to participate at any time in the future. Any questions the patient asked were addressed by a member of the research team.   After having all of their questions answered, the patient agreed to participate in the study by confirming their willingness to consent to participation verbally over the phone on 07/11/2023 at 4:42 PM.    Jerry Caras, PharmD PGY2 Oncology Pharmacy Resident  Principal Investigator

## 2023-07-11 NOTE — Telephone Encounter (Incomplete)
Oral Chemotherapy Pharmacist Encounter  I spoke with patient for overview of: Xeloda (capecitabine) for the  treatment of breast cancer, planned duration until disease progression or unacceptable drug toxicity.  Counseled patient on administration, dosing, side effects, monitoring, drug-food interactions, safe handling, storage, and disposal.  Patient will take Xeloda 500mg  tablets, 2 tablets (1000mg ) by mouth in AM and 2 tabs (1000mg ) by mouth in PM, within 30 minutes of finishing meals, for 14 days on, 7 days off, repeated every 21 days.  Xeloda start date: 07/14/2023  Adverse effects include but are not limited to: fatigue, decreased blood counts, GI upset, diarrhea, mouth sores, and hand-foot syndrome.  Discussed with patient that this medication will increase the patients concentration of warfarin and that this will increase the INR. Patient understands that we will need to monitor her INR closely while on therapy and MD is aware.  Next appt with Dr. Pamelia Hoit with labs is on 07/17/23.   Patient offered referral to diclofenac prophylaxis hand-foot syndrome research project. Patient agreed to referral.   Patient has anti-emetic on hand and knows to take it if nausea develops.   Patient will obtain anti diarrheal and alert the office of 4 or more loose stools above baseline.  Reviewed with patient importance of keeping a medication schedule and plan for any missed doses. No barriers to medication adherence identified.  Medication reconciliation performed and medication/allergy list updated.  Patient receives medication from [facility] specialty pharmacy.   Patient informed the pharmacy will reach out 5-7 days prior to needing next fill of Xeloda to coordinate continued medication acquisition to prevent break in therapy.  All questions answered.  Patient voiced understanding and appreciation.   Medication education handout placed in mail for patient. Patient knows to call the office with  questions or concerns. Oral Chemotherapy Clinic phone number provided to patient.   Bethel Born, PharmD Hematology/Oncology Clinical Pharmacist [facility] Oral Chemotherapy Navigation Clinic [phone]

## 2023-07-15 ENCOUNTER — Other Ambulatory Visit (HOSPITAL_COMMUNITY): Payer: Self-pay

## 2023-07-15 ENCOUNTER — Other Ambulatory Visit: Payer: Self-pay

## 2023-07-15 ENCOUNTER — Encounter: Payer: Self-pay | Admitting: Hematology and Oncology

## 2023-07-15 MED ORDER — DICLOFENAC SODIUM 1 % EX GEL
CUTANEOUS | 0 refills | Status: AC
Start: 2023-07-15 — End: ?
  Filled 2023-07-15: qty 400, 84d supply, fill #0

## 2023-07-16 ENCOUNTER — Ambulatory Visit (HOSPITAL_COMMUNITY): Payer: 59

## 2023-07-16 ENCOUNTER — Other Ambulatory Visit (HOSPITAL_COMMUNITY): Payer: Self-pay

## 2023-07-16 ENCOUNTER — Other Ambulatory Visit: Payer: Self-pay

## 2023-07-17 ENCOUNTER — Encounter: Payer: Self-pay | Admitting: Hematology and Oncology

## 2023-07-17 ENCOUNTER — Inpatient Hospital Stay (HOSPITAL_BASED_OUTPATIENT_CLINIC_OR_DEPARTMENT_OTHER): Payer: 59 | Admitting: Hematology and Oncology

## 2023-07-17 ENCOUNTER — Other Ambulatory Visit: Payer: Self-pay

## 2023-07-17 ENCOUNTER — Inpatient Hospital Stay: Payer: 59 | Attending: Oncology

## 2023-07-17 ENCOUNTER — Other Ambulatory Visit (HOSPITAL_COMMUNITY): Payer: Self-pay

## 2023-07-17 VITALS — BP 119/68 | HR 66 | Temp 97.3°F | Resp 18 | Ht 64.0 in | Wt 112.9 lb

## 2023-07-17 DIAGNOSIS — C50511 Malignant neoplasm of lower-outer quadrant of right female breast: Secondary | ICD-10-CM | POA: Diagnosis not present

## 2023-07-17 DIAGNOSIS — Z79899 Other long term (current) drug therapy: Secondary | ICD-10-CM | POA: Insufficient documentation

## 2023-07-17 DIAGNOSIS — Z170 Estrogen receptor positive status [ER+]: Secondary | ICD-10-CM | POA: Insufficient documentation

## 2023-07-17 DIAGNOSIS — Z79811 Long term (current) use of aromatase inhibitors: Secondary | ICD-10-CM | POA: Insufficient documentation

## 2023-07-17 DIAGNOSIS — C7951 Secondary malignant neoplasm of bone: Secondary | ICD-10-CM | POA: Diagnosis not present

## 2023-07-17 DIAGNOSIS — Z86718 Personal history of other venous thrombosis and embolism: Secondary | ICD-10-CM

## 2023-07-17 DIAGNOSIS — C787 Secondary malignant neoplasm of liver and intrahepatic bile duct: Secondary | ICD-10-CM | POA: Diagnosis present

## 2023-07-17 LAB — CBC WITH DIFFERENTIAL (CANCER CENTER ONLY)
Abs Immature Granulocytes: 0.01 10*3/uL (ref 0.00–0.07)
Basophils Absolute: 0.1 10*3/uL (ref 0.0–0.1)
Basophils Relative: 2 %
Eosinophils Absolute: 0 10*3/uL (ref 0.0–0.5)
Eosinophils Relative: 1 %
HCT: 37.6 % (ref 36.0–46.0)
Hemoglobin: 12.7 g/dL (ref 12.0–15.0)
Immature Granulocytes: 0 %
Lymphocytes Relative: 47 %
Lymphs Abs: 1.6 10*3/uL (ref 0.7–4.0)
MCH: 34.6 pg — ABNORMAL HIGH (ref 26.0–34.0)
MCHC: 33.8 g/dL (ref 30.0–36.0)
MCV: 102.5 fL — ABNORMAL HIGH (ref 80.0–100.0)
Monocytes Absolute: 0.3 10*3/uL (ref 0.1–1.0)
Monocytes Relative: 8 %
Neutro Abs: 1.5 10*3/uL — ABNORMAL LOW (ref 1.7–7.7)
Neutrophils Relative %: 42 %
Platelet Count: 181 10*3/uL (ref 150–400)
RBC: 3.67 MIL/uL — ABNORMAL LOW (ref 3.87–5.11)
RDW: 12.8 % (ref 11.5–15.5)
WBC Count: 3.5 10*3/uL — ABNORMAL LOW (ref 4.0–10.5)
nRBC: 0 % (ref 0.0–0.2)

## 2023-07-17 LAB — CMP (CANCER CENTER ONLY)
ALT: 13 U/L (ref 0–44)
AST: 20 U/L (ref 15–41)
Albumin: 4.2 g/dL (ref 3.5–5.0)
Alkaline Phosphatase: 95 U/L (ref 38–126)
Anion gap: 4 — ABNORMAL LOW (ref 5–15)
BUN: 10 mg/dL (ref 6–20)
CO2: 30 mmol/L (ref 22–32)
Calcium: 8.9 mg/dL (ref 8.9–10.3)
Chloride: 105 mmol/L (ref 98–111)
Creatinine: 0.97 mg/dL (ref 0.44–1.00)
GFR, Estimated: >60 mL/min (ref >60)
Glucose, Bld: 87 mg/dL (ref 70–99)
Potassium: 4.4 mmol/L (ref 3.5–5.1)
Sodium: 139 mmol/L (ref 135–145)
Total Bilirubin: 0.4 mg/dL (ref 0.3–1.2)
Total Protein: 6.6 g/dL (ref 6.5–8.1)

## 2023-07-17 LAB — PROTIME-INR
INR: 2.3 — ABNORMAL HIGH (ref 0.8–1.2)
Prothrombin Time: 25.3 s — ABNORMAL HIGH (ref 11.4–15.2)

## 2023-07-17 NOTE — Progress Notes (Signed)
Patient Care Team: Lavada Mesi, MD as PCP - General (Family Medicine) Peggyann Shoals, Leda Roys, MD as Referring Physician (Oncology) Donnelly Angelica, RN as Oncology Nurse Navigator Pershing Proud, RN as Oncology Nurse Navigator Emelia Loron, MD as Consulting Physician (General Surgery) Dorothy Puffer, MD as Consulting Physician (Radiation Oncology) Pollyann Savoy, MD as Consulting Physician (Rheumatology) Levi Aland, MD as Consulting Physician (Obstetrics and Gynecology) Laurey Morale, MD as Consulting Physician (Cardiology) Armbruster, Willaim Rayas, MD as Consulting Physician (Gastroenterology) Roberts Gaudy, MD as Referring Physician (Ophthalmology) Roberts Gaudy, MD as Referring Physician (Ophthalmology) Serena Croissant, MD as Consulting Physician (Hematology and Oncology) Anselm Lis, RPH-CPP as Pharmacist (Hematology and Oncology)  DIAGNOSIS:  Encounter Diagnosis  Name Primary?   Malignant neoplasm of lower-outer quadrant of right breast of female, estrogen receptor positive (HCC) Yes    SUMMARY OF ONCOLOGIC HISTORY: Oncology History  Malignant neoplasm of lower-outer quadrant of right breast of female, estrogen receptor positive (HCC)  08/04/2020 Initial Diagnosis   Right breast lower outer quadrant biopsy 08/04/2020 for a clinical T2 N0 M1, stage IV invasive ductal carcinoma, grade 3, triple positive, with an MIB-1 of 40%. abdominal MRI 08/24/2020 shows multiple liver lesions highly suspicious for liver metastases. Liver biopsy 09/20/2020 positive for carcinoma, prognostic panel estrogen and progesterone receptor positive, but HER-2 negative (0); MIB-1 of 30%   08/31/2020 - 12/14/2020 Chemotherapy   Neoadjuvant chemotherapy consisting of docetaxel, carboplatin, trastuzumab and pertuzumab every 21 days x 6    12/26/2020 Imaging   breast MRI 12/26/2020 showed right breast index lesion decreased to 0.8 cm; 2 additional masses in the right breast  stable abdominal MRI 01/29/2021 shows no active disease in the liver    Chemotherapy   Herceptin Maintenance initially q 3 weeks, now q 4 weeks   01/29/2021 -  Anti-estrogen oral therapy   anastrozole and palbociclib    01/29/2023 Genetic Testing   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  Report date is 01/29/2023.   The Multi-Cancer + RNA Panel offered by [facility] includes sequencing and/or deletion/duplication analysis of the following 70 genes:  AIP*, ALK, APC*, ATM*, AXIN2*, BAP1*, BARD1*, BLM*, BMPR1A*, BRCA1*, BRCA2*, BRIP1*, CDC73*, CDH1*, CDK4, CDKN1B*, CDKN2A, CHEK2*, CTNNA1*, DICER1*, EPCAM (del/dup only), EGFR, FH*, FLCN*, GREM1 (promoter dup only), HOXB13, KIT, LZTR1, MAX*, MBD4, MEN1*, MET, MITF, MLH1*, MSH2*, MSH3*, MSH6*, MUTYH*, NF1*, NF2*, NTHL1*, PALB2*, PDGFRA, PMS2*, POLD1*, POLE*, POT1*, PRKAR1A*, PTCH1*, PTEN*, RAD51C*, RAD51D*, RB1*, RET, SDHA* (sequencing only), SDHAF2*, SDHB*, SDHC*, SDHD*, SMAD4*, SMARCA4*, SMARCB1*, SMARCE1*, STK11*, SUFU*, TMEM127*, TP53*, TSC1*, TSC2*, VHL*. RNA analysis is performed for * genes.   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  Report date is 01/29/2023.   The Multi-Cancer + RNA Panel offered by [facility] includes sequencing and/or deletion/duplication analysis of the following 70 genes:  AIP*, ALK, APC*, ATM*, AXIN2*, BAP1*, BARD1*, BLM*, BMPR1A*, BRCA1*, BRCA2*, BRIP1*, CDC73*, CDH1*, CDK4, CDKN1B*, CDKN2A, CHEK2*, CTNNA1*, DICER1*, EPCAM (del/dup only), EGFR, FH*, FLCN*, GREM1 (promoter dup only), HOXB13, KIT, LZTR1, MAX*, MBD4, MEN1*, MET, MITF, MLH1*, MSH2*, MSH3*, MSH6*, MUTYH*, NF1*, NF2*, NTHL1*, PALB2*, PDGFRA, PMS2*, POLD1*, POLE*, POT1*, PRKAR1A*, PTCH1*, PTEN*,  RAD51C*, RAD51D*, RB1*, RET, SDHA* (sequencing only), SDHAF2*, SDHB*, SDHC*, SDHD*, SMAD4*, SMARCA4*, SMARCB1*, SMARCE1*, STK11*, SUFU*, KGMW102*,  TP53*, TSC1*, TSC2*, VHL*. RNA analysis is performed for * genes.     CHIEF COMPLIANT: Xeloda   INTERVAL HISTORY: Dynisha Badua is a 57 y.o. with above-mentioned history of  Metastatic breast cancer who progressed on Herceptin and olaparib. She presents to the clinic for a follow-up. Patient states that she is tolerating the Xeloda. She denies any diarrhea or nausea. She started Xeloda 9/1. She reports that her appetite is normal.   ALLERGIES:  is allergic to beef-derived products, latex, sulfa antibiotics, and tape.  MEDICATIONS:  Current Outpatient Medications  Medication Sig Dispense Refill   abemaciclib (VERZENIO) 100 MG tablet Take 1 tablet (100 mg total) by mouth 2 (two) times daily. 60 tablet 3   acetaminophen (TYLENOL) 500 MG tablet Take 1,000 mg by mouth every 6 (six) hours as needed for moderate pain or headache.     amphetamine-dextroamphetamine (ADDERALL XR) 20 MG 24 hr capsule Take 1 capsule (20 mg total) by mouth daily. 30 capsule 0   anastrozole (ARIMIDEX) 1 MG tablet Take 1 tablet (1 mg total) by mouth daily. 90 tablet 3   Ascorbic Acid (VITAMIN C PO) Take 3,000 mg by mouth daily.     capecitabine (XELODA) 500 MG tablet Take 2 tablets (1,000 mg total) by mouth 2 (two) times daily after a meal. Take for 14 days and then off for 7 days then repeat every 21 days. 56 tablet 6   Cholecalciferol (VITAMIN D3) 75 MCG (3000 UT) TABS Take 3,000 Units by mouth daily.     Clindamycin-Benzoyl Per, Refr, gel Apply topically.     diclofenac Sodium (VOLTAREN) 1 % GEL Research Patient: Apply 0.5 grams (1 fingertip) to each hand and each foot twice daily for up to 12 weeks 400 g 0   estradiol (ESTRACE) 0.1 MG/GM vaginal cream INSERT 1 APPLICATORFUL VAGINALLY 3 TIMES A WEEK 42.5 g 12   famotidine (PEPCID) 20 MG tablet Take 20 mg by mouth at  bedtime.     hydroxychloroquine (PLAQUENIL) 200 MG tablet TAKE 1 TABLET(200 MG) BY MOUTH DAILY 90 tablet 0   letrozole (FEMARA) 2.5 MG tablet Take 1 tablet (2.5 mg total) by mouth daily. 90 tablet 3   lidocaine-prilocaine (EMLA) cream Apply topically.     metoCLOPramide (REGLAN) 10 MG tablet Take 1 tablet (10 mg total) by mouth every 4 (four) hours as needed for nausea or vomiting. 30 tablet 3   nystatin-triamcinolone ointment (MYCOLOG) Apply topically.     ondansetron (ZOFRAN) 8 MG tablet Take 1 tablet (8 mg total) by mouth every 8 (eight) hours as needed for nausea or vomiting. 30 tablet 6   spironolactone (ALDACTONE) 25 MG tablet Take 25 mg by mouth daily as needed (swelling).     traMADol (ULTRAM) 50 MG tablet Take 1-2 tablets (50-100 mg total) by mouth every 6 (six) hours as needed. 60 tablet 0   tretinoin (RETIN-A) 0.1 % cream Apply 1 application  topically at bedtime as needed (acne).     triamterene-hydrochlorothiazide (DYAZIDE) 37.5-25 MG capsule TAKE 1 CAPSULE BY MOUTH DAILY 90 capsule 1   warfarin (COUMADIN) 5 MG tablet TAKE 1 TABLET(5 MG) BY MOUTH DAILY 30 tablet PRN   ascorbic acid (VITAMIN C) 500 MG tablet Take by mouth. (Patient not taking: Reported on 07/02/2023)     LORazepam (ATIVAN) 0.5 MG tablet Take 1 tablet (0.5 mg total) by mouth 2 (two) times daily. Take prior to scan for claustrophobia. (Patient not taking: Reported on 07/02/2023) 2 tablet 0   No  current facility-administered medications for this visit.    PHYSICAL EXAMINATION: ECOG PERFORMANCE STATUS: 1 - Symptomatic but completely ambulatory  Vitals:   07/17/23 1130  BP: 119/68  Pulse: 66  Resp: 18  Temp: (!) 97.3 F (36.3 C)  SpO2: 100%   Filed Weights   07/17/23 1130  Weight: 112 lb 14.4 oz (51.2 kg)      LABORATORY DATA:  I have reviewed the data as listed    Latest Ref Rng & Units 07/01/2023   12:54 PM 06/03/2023    3:39 PM 02/28/2023   12:37 PM  CMP  Glucose 70 - 99 mg/dL 91  76  92   BUN 6 - 20  mg/dL 19  17  18    Creatinine 0.44 - 1.00 mg/dL 4.09  8.11  9.14   Sodium 135 - 145 mmol/L 137  136  136   Potassium 3.5 - 5.1 mmol/L 4.3  4.1  3.9   Chloride 98 - 111 mmol/L 104  104  103   CO2 22 - 32 mmol/L 29  29  30    Calcium 8.9 - 10.3 mg/dL 8.9  9.3  9.1   Total Protein 6.5 - 8.1 g/dL 6.7  6.4  6.4   Total Bilirubin 0.3 - 1.2 mg/dL 0.4  0.4  0.4   Alkaline Phos 38 - 126 U/L 108  134  104   AST 15 - 41 U/L 24  41  27   ALT 0 - 44 U/L 17  27  19      Lab Results  Component Value Date   WBC 3.5 (L) 07/17/2023   HGB 12.7 07/17/2023   HCT 37.6 07/17/2023   MCV 102.5 (H) 07/17/2023   PLT 181 07/17/2023   NEUTROABS 1.5 (L) 07/17/2023    ASSESSMENT & PLAN:  Malignant neoplasm of lower-outer quadrant of right breast of female, estrogen receptor positive (HCC) 08/04/2020: Right breast biopsy T2 N0 M1 stage IV IDC grade 3, triple positive with a Ki-67 of 40%, multiple liver lesions biopsy was positive for breast cancer ER/PR positive HER2 negative with a Ki-67 30% 08/31/2020-12/14/2020: TCHP x6 01/29/2021: Letrozole and palbociclib   11/29/2022-06/05/2023: Olaparib   10/24/2022: CT CAP: Multiple new hepatic lesions measuring up to 1.5 cm consistent with metastatic disease, stable 5 mm nodule left costophrenic sulcus 12/05/2021: Guardant360: ATM gene mutation (possible treatment option olaparib), MSI high: Not detected.  Will refer the patient to genetic testing   Lab review: CA 27-29: 1365 it was 1159 01/28/2023 (was 1049 and previously it was 385 on 10/16/2022)   PET CT scan 03/03/2023: Widespread hypermetabolic metastatic disease within the liver is similar to previous PET/CT, some of the lesions have mildly decreased in size and metabolic activity consistent with response to treatment.  Small lytic lesions right acromion and right inferior pubic ramus PET/CT 06/02/2023: Interval progression of hepatic metastases.  Progression of bone metastases.  Suspicion of peritoneal disease.   06/13/2023:  Guardant360: ATM, T p53, FGF R1 amplification, TMB 4.73 mutations   Recommendation: Xeloda 1000 g p.o. twice daily 14 days on 7 days off.  Started 07/13/2023 Xeloda toxicities: Tolerating it fairly well.  CT chest abdomen and pelvis 07/16/2023: Interval decrease in the hypodensity in the liver stable bone metastases and stable peritoneal nodules   Return to clinic in 4 weeks for follow-up to assess tolerance to Xeloda.  (Other options include Enhertu and Faslodex with Verzinio were also discussed)  Return to clinic in 1 month with labs and follow-up  Orders Placed This Encounter  Procedures   CA 27.29    Standing Status:   Future    Standing Expiration Date:   07/16/2024   Protime-INR    Standing Status:   Future    Standing Expiration Date:   07/16/2024   CBC with Differential (Cancer Center Only)    Standing Status:   Future    Standing Expiration Date:   07/16/2024   CMP (Cancer Center only)    Standing Status:   Future    Standing Expiration Date:   07/16/2024   The patient has a good understanding of the overall plan. she agrees with it. she will call with any problems that may develop before the next visit here. Total time spent: 30 mins including face to face time and time spent for planning, charting and co-ordination of care   Tamsen Meek, MD 07/17/23    I Janan Ridge am acting as a Neurosurgeon for [employer]  I have reviewed the above documentation for accuracy and completeness, and I agree with the above.

## 2023-07-17 NOTE — Assessment & Plan Note (Addendum)
08/04/2020: Right breast biopsy T2 N0 M1 stage IV IDC grade 3, triple positive with a Ki-67 of 40%, multiple liver lesions biopsy was positive for breast cancer ER/PR positive HER2 negative with a Ki-67 30% 08/31/2020-12/14/2020: TCHP x6 01/29/2021: Letrozole and palbociclib   11/29/2022-06/05/2023: Olaparib   10/24/2022: CT CAP: Multiple new hepatic lesions measuring up to 1.5 cm consistent with metastatic disease, stable 5 mm nodule left costophrenic sulcus 12/05/2021: Guardant360: ATM gene mutation (possible treatment option olaparib), MSI high: Not detected.  Will refer the patient to genetic testing   Lab review: CA 27-29: 1365 it was 1159 01/28/2023 (was 1049 and previously it was 385 on 10/16/2022)   PET CT scan 03/03/2023: Widespread hypermetabolic metastatic disease within the liver is similar to previous PET/CT, some of the lesions have mildly decreased in size and metabolic activity consistent with response to treatment.  Small lytic lesions right acromion and right inferior pubic ramus PET/CT 06/02/2023: Interval progression of hepatic metastases.  Progression of bone metastases.  Suspicion of peritoneal disease.   06/13/2023: Guardant360: ATM, T p53, FGF R1 amplification, TMB 4.73 mutations   Recommendation: Xeloda 1000 g p.o. twice daily 14 days on 7 days off.  Started 07/13/2023 Xeloda toxicities: Tolerating it fairly well.  CT chest abdomen and pelvis 07/16/2023: Interval decrease in the hypodensity in the liver stable bone metastases and stable peritoneal nodules   Return to clinic in 4 weeks for follow-up to assess tolerance to Xeloda.  (Other options include Enhertu and Faslodex with Verzinio were also discussed)  Return to clinic in 1 month with labs and follow-up

## 2023-07-18 LAB — CANCER ANTIGEN 27.29: CA 27.29: 2220.5 U/mL — ABNORMAL HIGH (ref 0.0–38.6)

## 2023-08-07 ENCOUNTER — Other Ambulatory Visit: Payer: Self-pay | Admitting: *Deleted

## 2023-08-07 MED ORDER — AMPHETAMINE-DEXTROAMPHET ER 20 MG PO CP24: 20.0000 mg | ORAL_CAPSULE | Freq: Every day | ORAL | 0 refills | Status: DC

## 2023-08-07 NOTE — Telephone Encounter (Signed)
Patient contacted the office requesting a refill on Adderall.   Last Fill: 07/10/2023  Next Visit: 10/30/2023  Last Visit: 07/02/2023  Dx: not discussed   Current Dose per office note on 07/02/2023: not discussed   Okay to refill Adderall?

## 2023-08-11 NOTE — Telephone Encounter (Signed)
Patient called and advised that [facility] is out of stock of adderall prescription. Patient would like prescription sent to [facility] in [place] as they have it in stock.   I have called [facility] to cancel the prescription. Please send prescription to [facility] in [place]. Patient is aware that it will be tomorrow, 08/12/2023.

## 2023-08-11 NOTE — Addendum Note (Signed)
Addended by: Ellen Henri on: 08/11/2023 04:54 PM   Modules accepted: Orders

## 2023-08-12 MED ORDER — AMPHETAMINE-DEXTROAMPHET ER 20 MG PO CP24: 20.0000 mg | ORAL_CAPSULE | Freq: Every day | ORAL | 0 refills | Status: DC

## 2023-08-13 NOTE — Addendum Note (Signed)
Addended by: Belinda Block on: 08/13/2023 04:24 PM   Modules accepted: Orders

## 2023-08-14 ENCOUNTER — Inpatient Hospital Stay: Payer: 59

## 2023-08-14 ENCOUNTER — Inpatient Hospital Stay: Payer: 59 | Attending: Oncology

## 2023-08-14 ENCOUNTER — Inpatient Hospital Stay: Payer: 59 | Admitting: Hematology and Oncology

## 2023-08-14 VITALS — BP 124/67 | HR 63 | Temp 97.8°F | Resp 18 | Ht 64.0 in | Wt 111.8 lb

## 2023-08-14 DIAGNOSIS — Z170 Estrogen receptor positive status [ER+]: Secondary | ICD-10-CM | POA: Diagnosis not present

## 2023-08-14 DIAGNOSIS — Z95828 Presence of other vascular implants and grafts: Secondary | ICD-10-CM

## 2023-08-14 DIAGNOSIS — C50511 Malignant neoplasm of lower-outer quadrant of right female breast: Secondary | ICD-10-CM

## 2023-08-14 DIAGNOSIS — Z79899 Other long term (current) drug therapy: Secondary | ICD-10-CM | POA: Diagnosis not present

## 2023-08-14 DIAGNOSIS — C787 Secondary malignant neoplasm of liver and intrahepatic bile duct: Secondary | ICD-10-CM | POA: Insufficient documentation

## 2023-08-14 DIAGNOSIS — Z79811 Long term (current) use of aromatase inhibitors: Secondary | ICD-10-CM | POA: Insufficient documentation

## 2023-08-14 DIAGNOSIS — Z86718 Personal history of other venous thrombosis and embolism: Secondary | ICD-10-CM

## 2023-08-14 LAB — CBC WITH DIFFERENTIAL (CANCER CENTER ONLY)
Abs Immature Granulocytes: 0 10*3/uL (ref 0.00–0.07)
Basophils Absolute: 0.1 10*3/uL (ref 0.0–0.1)
Basophils Relative: 1 %
Eosinophils Absolute: 0 10*3/uL (ref 0.0–0.5)
Eosinophils Relative: 1 %
HCT: 37.5 % (ref 36.0–46.0)
Hemoglobin: 13.2 g/dL (ref 12.0–15.0)
Immature Granulocytes: 0 %
Lymphocytes Relative: 35 %
Lymphs Abs: 1.8 10*3/uL (ref 0.7–4.0)
MCH: 35.2 pg — ABNORMAL HIGH (ref 26.0–34.0)
MCHC: 35.2 g/dL (ref 30.0–36.0)
MCV: 100 fL (ref 80.0–100.0)
Monocytes Absolute: 0.5 10*3/uL (ref 0.1–1.0)
Monocytes Relative: 9 %
Neutro Abs: 2.7 10*3/uL (ref 1.7–7.7)
Neutrophils Relative %: 54 %
Platelet Count: 102 10*3/uL — ABNORMAL LOW (ref 150–400)
RBC: 3.75 MIL/uL — ABNORMAL LOW (ref 3.87–5.11)
RDW: 13.3 % (ref 11.5–15.5)
WBC Count: 5 10*3/uL (ref 4.0–10.5)
nRBC: 0 % (ref 0.0–0.2)

## 2023-08-14 LAB — CMP (CANCER CENTER ONLY)
ALT: 28 U/L (ref 0–44)
AST: 33 U/L (ref 15–41)
Albumin: 4.2 g/dL (ref 3.5–5.0)
Alkaline Phosphatase: 77 U/L (ref 38–126)
Anion gap: 4 — ABNORMAL LOW (ref 5–15)
BUN: 11 mg/dL (ref 6–20)
CO2: 28 mmol/L (ref 22–32)
Calcium: 8.9 mg/dL (ref 8.9–10.3)
Chloride: 105 mmol/L (ref 98–111)
Creatinine: 0.81 mg/dL (ref 0.44–1.00)
GFR, Estimated: >60 mL/min (ref >60)
Glucose, Bld: 85 mg/dL (ref 70–99)
Potassium: 4.1 mmol/L (ref 3.5–5.1)
Sodium: 137 mmol/L (ref 135–145)
Total Bilirubin: 0.8 mg/dL (ref 0.3–1.2)
Total Protein: 6.6 g/dL (ref 6.5–8.1)

## 2023-08-14 LAB — PROTIME-INR
INR: 1.9 — ABNORMAL HIGH (ref 0.8–1.2)
Prothrombin Time: 21.6 s — ABNORMAL HIGH (ref 11.4–15.2)

## 2023-08-14 MED ORDER — SODIUM CHLORIDE 0.9% FLUSH
10.0000 mL | Freq: Once | INTRAVENOUS | Status: AC
  Administered 2023-08-14: 10 mL

## 2023-08-14 MED ORDER — HEPARIN SOD (PORK) LOCK FLUSH 100 UNIT/ML IV SOLN
500.0000 [IU] | Freq: Once | INTRAVENOUS | Status: AC
  Administered 2023-08-14: 500 [IU]

## 2023-08-14 NOTE — Progress Notes (Signed)
Patient Care Team: Lavada Mesi, MD as PCP - General (Family Medicine) Peggyann Shoals, Leda Roys, MD as Referring Physician (Oncology) Donnelly Angelica, RN as Oncology Nurse Navigator Pershing Proud, RN as Oncology Nurse Navigator Emelia Loron, MD as Consulting Physician (General Surgery) Dorothy Puffer, MD as Consulting Physician (Radiation Oncology) Pollyann Savoy, MD as Consulting Physician (Rheumatology) Levi Aland, MD as Consulting Physician (Obstetrics and Gynecology) Laurey Morale, MD as Consulting Physician (Cardiology) Armbruster, Willaim Rayas, MD as Consulting Physician (Gastroenterology) Roberts Gaudy, MD as Referring Physician (Ophthalmology) Roberts Gaudy, MD as Referring Physician (Ophthalmology) Serena Croissant, MD as Consulting Physician (Hematology and Oncology) Anselm Lis, RPH-CPP as Pharmacist (Hematology and Oncology)  DIAGNOSIS:  Encounter Diagnosis  Name Primary?   Malignant neoplasm of lower-outer quadrant of right breast of female, estrogen receptor positive (HCC) Yes    SUMMARY OF ONCOLOGIC HISTORY: Oncology History  Malignant neoplasm of lower-outer quadrant of right breast of female, estrogen receptor positive (HCC)  08/04/2020 Initial Diagnosis   Right breast lower outer quadrant biopsy 08/04/2020 for a clinical T2 N0 M1, stage IV invasive ductal carcinoma, grade 3, triple positive, with an MIB-1 of 40%. abdominal MRI 08/24/2020 shows multiple liver lesions highly suspicious for liver metastases. Liver biopsy 09/20/2020 positive for carcinoma, prognostic panel estrogen and progesterone receptor positive, but HER-2 negative (0); MIB-1 of 30%   08/31/2020 - 12/14/2020 Chemotherapy   Neoadjuvant chemotherapy consisting of docetaxel, carboplatin, trastuzumab and pertuzumab every 21 days x 6    12/26/2020 Imaging   breast MRI 12/26/2020 showed right breast index lesion decreased to 0.8 cm; 2 additional masses in the right breast  stable abdominal MRI 01/29/2021 shows no active disease in the liver    Chemotherapy   Herceptin Maintenance initially q 3 weeks, now q 4 weeks   01/29/2021 -  Anti-estrogen oral therapy   anastrozole and palbociclib    01/29/2023 Genetic Testing   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  Report date is 01/29/2023.   The Multi-Cancer + RNA Panel offered by [facility] includes sequencing and/or deletion/duplication analysis of the following 70 genes:  AIP*, ALK, APC*, ATM*, AXIN2*, BAP1*, BARD1*, BLM*, BMPR1A*, BRCA1*, BRCA2*, BRIP1*, CDC73*, CDH1*, CDK4, CDKN1B*, CDKN2A, CHEK2*, CTNNA1*, DICER1*, EPCAM (del/dup only), EGFR, FH*, FLCN*, GREM1 (promoter dup only), HOXB13, KIT, LZTR1, MAX*, MBD4, MEN1*, MET, MITF, MLH1*, MSH2*, MSH3*, MSH6*, MUTYH*, NF1*, NF2*, NTHL1*, PALB2*, PDGFRA, PMS2*, POLD1*, POLE*, POT1*, PRKAR1A*, PTCH1*, PTEN*, RAD51C*, RAD51D*, RB1*, RET, SDHA* (sequencing only), SDHAF2*, SDHB*, SDHC*, SDHD*, SMAD4*, SMARCA4*, SMARCB1*, SMARCE1*, STK11*, SUFU*, TMEM127*, TP53*, TSC1*, TSC2*, VHL*. RNA analysis is performed for * genes.   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  Report date is 01/29/2023.   The Multi-Cancer + RNA Panel offered by [facility] includes sequencing and/or deletion/duplication analysis of the following 70 genes:  AIP*, ALK, APC*, ATM*, AXIN2*, BAP1*, BARD1*, BLM*, BMPR1A*, BRCA1*, BRCA2*, BRIP1*, CDC73*, CDH1*, CDK4, CDKN1B*, CDKN2A, CHEK2*, CTNNA1*, DICER1*, EPCAM (del/dup only), EGFR, FH*, FLCN*, GREM1 (promoter dup only), HOXB13, KIT, LZTR1, MAX*, MBD4, MEN1*, MET, MITF, MLH1*, MSH2*, MSH3*, MSH6*, MUTYH*, NF1*, NF2*, NTHL1*, PALB2*, PDGFRA, PMS2*, POLD1*, POLE*, POT1*, PRKAR1A*, PTCH1*, PTEN*,  RAD51C*, RAD51D*, RB1*, RET, SDHA* (sequencing only), SDHAF2*, SDHB*, SDHC*, SDHD*, SMAD4*, SMARCA4*, SMARCB1*, SMARCE1*, STK11*, SUFU*, HYQM578*,  TP53*, TSC1*, TSC2*, VHL*. RNA analysis is performed for * genes.     CHIEF COMPLIANT: Follow-up on Xeloda    History of Present Illness   The patient, with a history of cancer, presents with side effects from a new medication. She reports stomach issues, primarily constipation and gassiness, as well as occasional diarrhea. She also notes increased nausea, which is not daily and is managed with Zofran. The patient has noticed changes in skin color, turning more blue, which she attributes to poor circulation. She denies any skin changes or tingling sensations. She also reports increased fatigue, to the point of needing to rest in the afternoon after work.        ALLERGIES:  is allergic to beef-derived products, latex, sulfa antibiotics, and tape.  MEDICATIONS:  Current Outpatient Medications  Medication Sig Dispense Refill   acetaminophen (TYLENOL) 500 MG tablet Take 1,000 mg by mouth every 6 (six) hours as needed for moderate pain or headache.     amphetamine-dextroamphetamine (ADDERALL XR) 20 MG 24 hr capsule Take 1 capsule (20 mg total) by mouth daily. 30 capsule 0   Ascorbic Acid (VITAMIN C PO) Take 3,000 mg by mouth daily.     ascorbic acid (VITAMIN C) 500 MG tablet Take by mouth.     capecitabine (XELODA) 500 MG tablet Take 2 tablets (1,000 mg total) by mouth 2 (two) times daily after a meal. Take for 14 days and then off for 7 days then repeat every 21 days. 56 tablet 6   Cholecalciferol (VITAMIN D3) 75 MCG (3000 UT) TABS Take 3,000 Units by mouth daily.     Clindamycin-Benzoyl Per, Refr, gel Apply topically.     diclofenac Sodium (VOLTAREN) 1 % GEL Research Patient: Apply 0.5 grams (1 fingertip) to each hand and each foot twice daily for up to 12 weeks 400 g 0   estradiol (ESTRACE) 0.1 MG/GM vaginal cream INSERT 1 APPLICATORFUL VAGINALLY 3  TIMES A WEEK 42.5 g 12   famotidine (PEPCID) 20 MG tablet Take 20 mg by mouth at bedtime.     hydroxychloroquine (PLAQUENIL) 200 MG tablet TAKE 1 TABLET(200 MG) BY MOUTH DAILY 90 tablet 0   letrozole (FEMARA) 2.5 MG tablet Take 1 tablet (2.5 mg total) by mouth daily. 90 tablet 3   lidocaine-prilocaine (EMLA) cream Apply topically.     nystatin-triamcinolone ointment (MYCOLOG) Apply topically.     ondansetron (ZOFRAN) 8 MG tablet Take 1 tablet (8 mg total) by mouth every 8 (eight) hours as needed for nausea or vomiting. 30 tablet 6   spironolactone (ALDACTONE) 25 MG tablet Take 25 mg by mouth daily as needed (swelling).     traMADol (ULTRAM) 50 MG tablet Take 1-2 tablets (50-100 mg total) by mouth every 6 (six) hours as needed. 60 tablet 0   tretinoin (RETIN-A) 0.1 % cream Apply 1 application  topically at bedtime as needed (acne).     triamterene-hydrochlorothiazide (DYAZIDE) 37.5-25 MG capsule TAKE 1 CAPSULE BY MOUTH DAILY 90 capsule 1   warfarin (COUMADIN) 5 MG tablet TAKE 1 TABLET(5 MG) BY MOUTH DAILY 30 tablet PRN   No current facility-administered medications for this visit.    PHYSICAL EXAMINATION: ECOG PERFORMANCE STATUS: 1 - Symptomatic but completely ambulatory  Vitals:   08/14/23 1451  BP: 124/67  Pulse: 63  Resp: 18  Temp: 97.8 F (36.6 C)  SpO2: 100%   Filed Weights   08/14/23 1451  Weight: 111 lb 12.8 oz (50.7 kg)     LABORATORY DATA:  I have reviewed the data as listed    Latest Ref Rng & Units 08/14/2023    1:46 PM 07/17/2023   11:11 AM 07/01/2023   12:54 PM  CMP  Glucose 70 - 99 mg/dL 85  87  91   BUN 6 - 20 mg/dL 11  10  19    Creatinine 0.44 - 1.00 mg/dL 5.28  4.13  2.44   Sodium 135 - 145 mmol/L 137  139  137   Potassium 3.5 - 5.1 mmol/L 4.1  4.4  4.3   Chloride 98 - 111 mmol/L 105  105  104   CO2 22 - 32 mmol/L 28  30  29    Calcium 8.9 - 10.3 mg/dL 8.9  8.9  8.9   Total Protein 6.5 - 8.1 g/dL 6.6  6.6  6.7   Total Bilirubin 0.3 - 1.2 mg/dL 0.8  0.4   0.4   Alkaline Phos 38 - 126 U/L 77  95  108   AST 15 - 41 U/L 33  20  24   ALT 0 - 44 U/L 28  13  17      Lab Results  Component Value Date   WBC 5.0 08/14/2023   HGB 13.2 08/14/2023   HCT 37.5 08/14/2023   MCV 100.0 08/14/2023   PLT 102 (L) 08/14/2023   NEUTROABS 2.7 08/14/2023    ASSESSMENT & PLAN:  Malignant neoplasm of lower-outer quadrant of right breast of female, estrogen receptor positive (HCC) 08/04/2020: Right breast biopsy T2 N0 M1 stage IV IDC grade 3, triple positive with a Ki-67 of 40%, multiple liver lesions biopsy was positive for breast cancer ER/PR positive HER2 negative with a Ki-67 30% 08/31/2020-12/14/2020: TCHP x6 01/29/2021: Letrozole and palbociclib   11/29/2022-06/05/2023: Olaparib   10/24/2022: CT CAP: Multiple new hepatic lesions measuring up to 1.5 cm consistent with metastatic disease, stable 5 mm nodule left costophrenic sulcus 12/05/2021: Guardant360: ATM gene mutation (possible treatment option olaparib), MSI high: Not detected.  Will refer the patient to genetic testing   Lab review: CA 27-29: 1365 it was 1159 01/28/2023 (was 1049 and previously it was 385 on 10/16/2022)   PET CT scan 03/03/2023: Widespread hypermetabolic metastatic disease within the liver is similar to previous PET/CT, some of the lesions have mildly decreased in size and metabolic activity consistent with response to treatment.  Small lytic lesions right acromion and right inferior pubic ramus PET/CT 06/02/2023: Interval progression of hepatic metastases.  Progression of bone metastases.  Suspicion of peritoneal disease.   06/13/2023: Guardant360: ATM, T p53, FGF R1 amplification, TMB 4.73 mutations   Recommendation: Xeloda 1000 g p.o. twice daily 14 days on 7 days off.  Started 07/13/2023 Xeloda toxicities:  Fatigue Nausea: Taking nausea medications   CT chest abdomen and pelvis 07/16/2023: Interval decrease in the hypodensity in the liver stable bone metastases and stable peritoneal  nodules   Return to clinic in 2 months with CT chest abdomen pelvis and follow-up after that (Other options include Enhertu and Faslodex with Verzinio were also discussed)   Skin discoloration Reports of skin turning blue, particularly on feet. No tingling or other symptoms. -Continue hydration and moisturizing regimen.  Follow-up in 2 months after scans   Orders Placed This Encounter  Procedures   CT CHEST ABDOMEN PELVIS W CONTRAST    Standing Status:   Future    Standing Expiration Date:   08/13/2024    Order Specific Question:   If indicated for the ordered procedure, I authorize the  administration of contrast media per Radiology protocol    Answer:   Yes    Order Specific Question:   Does the patient have a contrast media/X-ray dye allergy?    Answer:   No    Order Specific Question:   Is patient pregnant?    Answer:   No    Order Specific Question:   Preferred imaging location?    Answer:   [facility]    Order Specific Question:   Release to patient    Answer:   Immediate    Order Specific Question:   If indicated for the ordered procedure, I authorize the administration of oral contrast media per Radiology protocol    Answer:   Yes   CBC with Differential (Cancer Center Only)    Standing Status:   Future    Standing Expiration Date:   08/13/2024   CMP (Cancer Center only)    Standing Status:   Future    Standing Expiration Date:   08/13/2024   CA 27.29    Standing Status:   Future    Standing Expiration Date:   08/13/2024   The patient has a good understanding of the overall plan. she agrees with it. she will call with any problems that may develop before the next visit here. Total time spent: 30 mins including face to face time and time spent for planning, charting and co-ordination of care   Tamsen Meek, MD 08/14/23

## 2023-08-14 NOTE — Assessment & Plan Note (Addendum)
08/04/2020: Right breast biopsy T2 N0 M1 stage IV IDC grade 3, triple positive with a Ki-67 of 40%, multiple liver lesions biopsy was positive for breast cancer ER/PR positive HER2 negative with a Ki-67 30% 08/31/2020-12/14/2020: TCHP x6 01/29/2021: Letrozole and palbociclib   11/29/2022-06/05/2023: Olaparib   10/24/2022: CT CAP: Multiple new hepatic lesions measuring up to 1.5 cm consistent with metastatic disease, stable 5 mm nodule left costophrenic sulcus 12/05/2021: Guardant360: ATM gene mutation (possible treatment option olaparib), MSI high: Not detected.  Will refer the patient to genetic testing   Lab review: CA 27-29: 1365 it was 1159 01/28/2023 (was 1049 and previously it was 385 on 10/16/2022)   PET CT scan 03/03/2023: Widespread hypermetabolic metastatic disease within the liver is similar to previous PET/CT, some of the lesions have mildly decreased in size and metabolic activity consistent with response to treatment.  Small lytic lesions right acromion and right inferior pubic ramus PET/CT 06/02/2023: Interval progression of hepatic metastases.  Progression of bone metastases.  Suspicion of peritoneal disease.   06/13/2023: Guardant360: ATM, T p53, FGF R1 amplification, TMB 4.73 mutations   Recommendation: Xeloda 1000 g p.o. twice daily 14 days on 7 days off.  Started 07/13/2023 Xeloda toxicities: Tolerating it fairly well.   CT chest abdomen and pelvis 07/16/2023: Interval decrease in the hypodensity in the liver stable bone metastases and stable peritoneal nodules   Return to clinic in 2 months with CT chest abdomen pelvis and follow-up after that (Other options include Enhertu and Faslodex with Verzinio were also discussed)  Follow-up in 2 months after scans

## 2023-08-15 ENCOUNTER — Other Ambulatory Visit: Payer: Self-pay | Admitting: Physician Assistant

## 2023-08-15 LAB — CANCER ANTIGEN 27.29: CA 27.29: 2189.3 U/mL — ABNORMAL HIGH (ref 0.0–38.6)

## 2023-08-15 NOTE — Telephone Encounter (Signed)
Last Fill: 05/19/2023  Eye exam: 04/23/2023 WNL    Labs: 08/14/2023 RBC 3.75, MCH 35.2, Platelet Count 102, Anion gap 4  Next Visit: 10/30/2023  Last Visit: 07/02/2023  DX:SLE (systemic lupus erythematosus related syndrome)   Current Dose per office note 07/02/2023: Plaquenil 200 mg 1 tablet by mouth daily.   Okay to refill Plaquenil?

## 2023-08-19 ENCOUNTER — Telehealth: Payer: Self-pay | Admitting: Hematology and Oncology

## 2023-08-22 ENCOUNTER — Other Ambulatory Visit: Payer: Self-pay | Admitting: Physician Assistant

## 2023-09-05 ENCOUNTER — Ambulatory Visit: Payer: 59 | Admitting: Pharmacist

## 2023-09-05 MED ORDER — CLOBETASOL PROPIONATE 0.05 % EX CREA: 1.0000 | TOPICAL_CREAM | Freq: Two times a day (BID) | CUTANEOUS | 0 refills | Status: DC

## 2023-09-05 NOTE — Progress Notes (Signed)
[facility]       Telephone: [phone]?Fax: [phone]   Oncology Clinical Pharmacist Practitioner Progress Note  Erica Wood is a 57 y.o. female with a diagnosis of metastatic breast cancer currently on capecitabine under the care of Dr. Serena Croissant.   I connected with Maretta Los Daus today by telephone and verified that I was speaking with the correct person using two patient identifiers. I discussed the limitations, risks, security and privacy concerns of performing an evaluation and management service by telemedicine and the availability of in-person appointments. The patient/caregiver expressed understanding and agreed to proceed.  Other persons participating in the visit and their role in the encounter: none   Patient's location: home  Provider's location: clinic  [name] contacted Dr. Earmon Phoenix clinic regarding blisters that have started to appear and wanted to know what to do. Clinical pharmacy contacted her back to review symptoms. She states she is finishing up her first week of capecitabine of her current cycle and was to start her second week on Monday 09/08/23. She reports two blisters, one on her right big toe, and one on her left second toe. She states they appear fluid filled but have not popped. They do not appear infected. She states she had another blister a few weeks ago that healed without intervention. She is also having some redness on her feet but she states her instrumental ADLs and self-care ADLs are not limited. This is likely a Grade 2 toxicity per CTCAE v5.0 due to the blisters.  She reports missing 5 morning applications of diclofenac gel which she uses BID for prevention of HFS. She has also recently started using Udderly Smooth with Urea. Out of an abundance of caution, we have recommended she hold capecitabine for one week and start clobetasol propionate cream (Temovate) topically twice daily to the affected areas.  She prefers  to pick this up at her local pharmacy of [facility] on [address]. We will contact her next week to discuss restarting capecitabine with a possibility of a dose reduction. She knows to call Dr. Earmon Phoenix clinic sooner should her symptoms worsen or for any signs of infection. She verbalized understanding of the plan and was in agreement. We will also forward our message to Dr. Pamelia Hoit and his nursing staff so they are aware of our conversation with [name].   [name] participated in the discussion, expressed understanding, and voiced agreement with the above plan. All questions were answered to her satisfaction. The patient was advised to contact the clinic at [phone] with any questions or concerns prior to her return visit.  Clinical pharmacy will continue to support [name] and Dr. Serena Croissant as needed.  Defne Gerling A. Odetta Pink, PharmD, BCOP, CPP  Anselm Lis, RPH-CPP,  09/05/2023  2:02 PM   **Disclaimer: This note was dictated with voice recognition software. Similar sounding words can inadvertently be transcribed and this note may contain transcription errors which may not have been corrected upon publication of note.**

## 2023-09-08 ENCOUNTER — Telehealth: Payer: Self-pay | Admitting: Pharmacist

## 2023-09-08 NOTE — Telephone Encounter (Signed)
Per secure chat on 10/28 patient is needing a follow up sometime this week, left patient a message in regards to scheduling follow up in Pharmacy Clinic, left a voicemail with callback number

## 2023-09-09 ENCOUNTER — Other Ambulatory Visit: Payer: Self-pay | Admitting: *Deleted

## 2023-09-09 MED ORDER — AMPHETAMINE-DEXTROAMPHET ER 20 MG PO CP24: 20.0000 mg | ORAL_CAPSULE | Freq: Every day | ORAL | 0 refills | Status: DC

## 2023-09-09 NOTE — Telephone Encounter (Signed)
Last Fill: 08/12/2023  Next Visit: 10/30/2023  Last Visit: 07/02/2023  Dx: not discussed  Current Dose per office note on 07/02/2023: not mentioned  Okay to refill Adderall?

## 2023-09-11 ENCOUNTER — Inpatient Hospital Stay: Payer: 59 | Admitting: Pharmacist

## 2023-09-11 DIAGNOSIS — Z170 Estrogen receptor positive status [ER+]: Secondary | ICD-10-CM

## 2023-09-11 NOTE — Progress Notes (Signed)
[facility]       Telephone: [phone]?Fax: [phone]   Oncology Clinical Pharmacist Practitioner Progress Note  Erica Wood is a 57 y.o. female with a diagnosis of metastatic breast cancer currently on capecitabine under the care of Dr. Serena Croissant.   I connected with Erica Wood today by telephone and verified that I was speaking with the correct person using two patient identifiers. I discussed the limitations, risks, security and privacy concerns of performing an evaluation and management service by telemedicine and the availability of in-person appointments. The patient/caregiver expressed understanding and agreed to proceed.  Other persons participating in the visit and their role in the encounter: John A. Odetta Pink, PharmD, BCOP, CPP   Patient's location: home  Provider's location: clinic  We followed-up with Erica Wood today regarding management of capecitabine induced hand foot syndrome. Today she reports that the blisters on her foot are still present but have markedly improved. There is some skin redness that remains. She has been holding her capecitabine since 10/25 and endorses using the clobetasol twice daily since then as well.   With her improving symptoms, she is in agreement to resume her capecitabine starting Monday 09/15/23 as long as the blisters are fully healed by then. She knows to call the office if her blisters have not improved by then. Starting 09/15/23, she will complete the remainder of her current cycle (7 days). Starting 09/29/23 she will start her week off from capecitabine. Will plan to schedule Dr. Pamelia Hoit visit with labs for 09/30/23 for continued assessment of her current therapy regimen. Erica Wood may follow up with clinical pharmacy as needed going forward.   Erica Wood participated in the discussion, expressed understanding, and voiced agreement with the above plan. All questions were answered to her  satisfaction. The patient was advised to contact the clinic at [phone] with any questions or concerns prior to her return visit.  Clinical pharmacy will continue to support Erica Wood and Dr. Serena Croissant as needed.     Jerry Caras, PharmD PGY2 Oncology Pharmacy Resident   09/11/2023 1:31 PM

## 2023-09-15 ENCOUNTER — Telehealth: Payer: Self-pay | Admitting: Hematology and Oncology

## 2023-09-30 ENCOUNTER — Ambulatory Visit: Payer: 59 | Admitting: Hematology and Oncology

## 2023-09-30 ENCOUNTER — Other Ambulatory Visit: Payer: 59

## 2023-10-06 ENCOUNTER — Other Ambulatory Visit: Payer: Self-pay

## 2023-10-06 MED ORDER — AMPHETAMINE-DEXTROAMPHET ER 20 MG PO CP24: 20.0000 mg | ORAL_CAPSULE | Freq: Every day | ORAL | 0 refills | Status: DC

## 2023-10-06 NOTE — Telephone Encounter (Signed)
Patient requested refill of adderall to be sent to [facility] on [address]. Patient states she called a few days early due to the holiday and did not know when we would be closed.   Last Fill: 09/09/2023  Next Visit: 10/30/2023  Last Visit: 07/02/2023  DX: not discussed.  Current Dose per office note on 07/02/2023: not discussed  Okay to refill adderall?

## 2023-10-13 ENCOUNTER — Other Ambulatory Visit: Payer: 59

## 2023-10-14 ENCOUNTER — Encounter (HOSPITAL_COMMUNITY): Payer: Self-pay

## 2023-10-14 ENCOUNTER — Inpatient Hospital Stay: Payer: 59 | Attending: Oncology

## 2023-10-14 ENCOUNTER — Ambulatory Visit (HOSPITAL_COMMUNITY)
Admission: RE | Admit: 2023-10-14 | Discharge: 2023-10-14 | Disposition: A | Discharge status: Home / Self Care | Payer: 59 | Source: Ambulatory Visit | Attending: Hematology and Oncology | Admitting: Hematology and Oncology

## 2023-10-14 ENCOUNTER — Telehealth: Payer: Self-pay

## 2023-10-14 DIAGNOSIS — Z170 Estrogen receptor positive status [ER+]: Secondary | ICD-10-CM | POA: Insufficient documentation

## 2023-10-14 DIAGNOSIS — C7951 Secondary malignant neoplasm of bone: Secondary | ICD-10-CM | POA: Diagnosis not present

## 2023-10-14 DIAGNOSIS — C50511 Malignant neoplasm of lower-outer quadrant of right female breast: Secondary | ICD-10-CM | POA: Insufficient documentation

## 2023-10-14 DIAGNOSIS — Z79811 Long term (current) use of aromatase inhibitors: Secondary | ICD-10-CM | POA: Diagnosis not present

## 2023-10-14 DIAGNOSIS — L271 Localized skin eruption due to drugs and medicaments taken internally: Secondary | ICD-10-CM | POA: Diagnosis not present

## 2023-10-14 DIAGNOSIS — Z86718 Personal history of other venous thrombosis and embolism: Secondary | ICD-10-CM

## 2023-10-14 DIAGNOSIS — Z79899 Other long term (current) drug therapy: Secondary | ICD-10-CM | POA: Insufficient documentation

## 2023-10-14 DIAGNOSIS — C787 Secondary malignant neoplasm of liver and intrahepatic bile duct: Secondary | ICD-10-CM | POA: Insufficient documentation

## 2023-10-14 LAB — CBC WITH DIFFERENTIAL (CANCER CENTER ONLY)
Abs Immature Granulocytes: 0.01 10*3/uL (ref 0.00–0.07)
Basophils Absolute: 0.1 10*3/uL (ref 0.0–0.1)
Basophils Relative: 1 %
Eosinophils Absolute: 0 10*3/uL (ref 0.0–0.5)
Eosinophils Relative: 0 %
HCT: 40 % (ref 36.0–46.0)
Hemoglobin: 13.4 g/dL (ref 12.0–15.0)
Immature Granulocytes: 0 %
Lymphocytes Relative: 21 %
Lymphs Abs: 1.4 10*3/uL (ref 0.7–4.0)
MCH: 34.9 pg — ABNORMAL HIGH (ref 26.0–34.0)
MCHC: 33.5 g/dL (ref 30.0–36.0)
MCV: 104.2 fL — ABNORMAL HIGH (ref 80.0–100.0)
Monocytes Absolute: 0.6 10*3/uL (ref 0.1–1.0)
Monocytes Relative: 9 %
Neutro Abs: 4.5 10*3/uL (ref 1.7–7.7)
Neutrophils Relative %: 69 %
Platelet Count: 140 10*3/uL — ABNORMAL LOW (ref 150–400)
RBC: 3.84 MIL/uL — ABNORMAL LOW (ref 3.87–5.11)
RDW: 14.1 % (ref 11.5–15.5)
WBC Count: 6.6 10*3/uL (ref 4.0–10.5)
nRBC: 0 % (ref 0.0–0.2)

## 2023-10-14 LAB — CMP (CANCER CENTER ONLY)
ALT: 29 U/L (ref 0–44)
AST: 32 U/L (ref 15–41)
Albumin: 4.2 g/dL (ref 3.5–5.0)
Alkaline Phosphatase: 71 U/L (ref 38–126)
Anion gap: 4 — ABNORMAL LOW (ref 5–15)
BUN: 16 mg/dL (ref 6–20)
CO2: 31 mmol/L (ref 22–32)
Calcium: 8.9 mg/dL (ref 8.9–10.3)
Chloride: 102 mmol/L (ref 98–111)
Creatinine: 0.85 mg/dL (ref 0.44–1.00)
GFR, Estimated: >60 mL/min (ref >60)
Glucose, Bld: 95 mg/dL (ref 70–99)
Potassium: 4.1 mmol/L (ref 3.5–5.1)
Sodium: 137 mmol/L (ref 135–145)
Total Bilirubin: 0.8 mg/dL (ref <1.2)
Total Protein: 6.4 g/dL — ABNORMAL LOW (ref 6.5–8.1)

## 2023-10-14 LAB — PROTIME-INR
INR: 1.7 — ABNORMAL HIGH (ref 0.8–1.2)
Prothrombin Time: 19.9 s — ABNORMAL HIGH (ref 11.4–15.2)

## 2023-10-14 MED ORDER — IOHEXOL 300 MG/ML  SOLN
100.0000 mL | Freq: Once | INTRAMUSCULAR | Status: AC | PRN
Start: 2023-10-14 — End: 2023-10-14
  Administered 2023-10-14: 100 mL via INTRAVENOUS

## 2023-10-14 NOTE — Telephone Encounter (Signed)
Patient informed FMLA was completed and faxed back to company. Fax confirmation received. Copy of documents handed to patient in office. No further concerns at this time.

## 2023-10-15 LAB — CANCER ANTIGEN 27.29: CA 27.29: 1933.2 U/mL — ABNORMAL HIGH (ref 0.0–38.6)

## 2023-10-16 NOTE — Progress Notes (Unsigned)
Office Visit Note  Patient: Erica Wood             Date of Birth: 1966/06/22           MRN: [id number]             PCP: Lavada Mesi, MD Referring: Lavada Mesi, MD Visit Date: 10/30/2023 Occupation: @GUAROCC @  Subjective:  Medication monitoring   History of Present Illness: Erica Wood is a 57 y.o. female with history of systemic lupus and inflammatory arthritis.  Patient remains on plaquenil 200 mg 1 tablet by mouth daily. She is tolerating plaquenil without any side effects.  She denies any gaps in therapy.  She continues to have chronic pain involving both hands and both knee joints.  She has noticed ongoing swelling in both hands and intermittent inflammation in her knees. She has intermittent nasal ulcers, raynaud's, and chronic fatigue.  She remains under the care of Dr. Pamelia Hoit.  Patient states that she was recently diagnosed with hand-foot syndrome and has been using over-the-counter topical agents to help with the cracking and dryness involving both hands felt to be a side effect of xeloda.    Activities of Daily Living:  Patient reports morning stiffness for 5-10 minutes  Patient Denies nocturnal pain.  Difficulty dressing/grooming: Denies Difficulty climbing stairs: Reports Difficulty getting out of chair: Reports Difficulty using hands for taps, buttons, cutlery, and/or writing: Reports  Review of Systems  Constitutional:  Positive for fatigue.  HENT:  Positive for nose dryness (Nose soreness). Negative for mouth sores and mouth dryness.   Eyes:  Negative for pain, visual disturbance and dryness.  Respiratory:  Negative for cough, hemoptysis, shortness of breath and difficulty breathing.   Cardiovascular:  Negative for chest pain, palpitations, hypertension and swelling in legs/feet.  Gastrointestinal:  Positive for constipation. Negative for blood in stool and diarrhea.  Endocrine: Negative for increased urination.  Genitourinary:  Negative for  painful urination.  Musculoskeletal:  Positive for joint pain, joint pain, joint swelling and morning stiffness. Negative for myalgias, muscle weakness, muscle tenderness and myalgias.  Skin:  Positive for color change and rash. Negative for pallor, hair loss, nodules/bumps, skin tightness, ulcers and sensitivity to sunlight.  Allergic/Immunologic: Negative for susceptible to infections.  Neurological:  Positive for dizziness. Negative for numbness, headaches and weakness.  Hematological:  Negative for swollen glands.  Psychiatric/Behavioral:  Negative for depressed mood and sleep disturbance. The patient is not nervous/anxious.     PMFS History:  Patient Active Problem List   Diagnosis Date Noted   Monoallelic mutation of ATM gene 65/78/4696   Genetic testing 02/04/2023   Goals of care, counseling/discussion 05/09/2021   Malignant neoplasm metastatic to liver (HCC) 01/03/2021   Port-A-Cath in place 11/02/2020   Aortic atherosclerosis (HCC) 09/21/2020   Malignant neoplasm of lower-outer quadrant of right breast of female, estrogen receptor positive (HCC) 08/10/2020   Abnormal cervical Papanicolaou smear 07/12/2020   Pulmonary embolism (HCC) 07/12/2020   Fibrocystic disease of breast 01/22/2018   Primary osteoarthritis of both knees 05/02/2017   Raynaud's disease without gangrene 11/26/2016   Discoid lupus 11/26/2016   Anticardiolipin antibody positive 11/26/2016   Autoimmune disease (HCC) 11/25/2016   High risk medication use 11/25/2016   Primary osteoarthritis of both hands 11/25/2016   History of DVT (deep vein thrombosis) 11/25/2016   History of pulmonary embolism 11/25/2016   Primary insomnia 11/25/2016   Anxiety 11/25/2016   Irritable bowel syndrome with constipation 11/25/2016   Raynaud's phenomenon 01/03/2016  Varicose veins of bilateral lower extremities with other complications 05/24/2014   Spider veins of both lower extremities 05/24/2014    Past Medical History:   Diagnosis Date   Anti-cardiolipin antibody positive    Anti-cardiolipin antibody syndrome (HCC)    Anxiety    Arthralgia    Arthritis    bil knees, hands   Atypical chest pain    Autoimmune disease (HCC)    Autoimmune disease (HCC) 11/25/2016   Breast cancer metastasized to liver (HCC) 09/2020   Cancer (HCC) 07/2020   right breast IDC   Chronic fatigue    Depression    DVT (deep venous thrombosis) (HCC)    IBS (irritable bowel syndrome)    with constipation   Lupus    Lymphedema    bilateral legs per patient   Monoallelic mutation of ATM gene 29/52/8413   Pulmonary embolus (HCC)    Raynaud's syndrome    Sicca (HCC)    Thrombocytopenia (HCC)     Family History  Problem Relation Age of Onset   Heart attack Mother    Rheum arthritis Father    Hemachromatosis Father    Heart Problems Father    Cancer - Other Sister        Ewing sarcoma; d. 75   Lung cancer Maternal Aunt        smoking hx; 53s   Cancer Paternal Grandmother 39       unknown type   Colon cancer Neg Hx    Stomach cancer Neg Hx    Esophageal cancer Neg Hx    Pancreatic cancer Neg Hx    Liver disease Neg Hx    Past Surgical History:  Procedure Laterality Date   ABLATION  12/2018   leg veins   ABLATION Right 08/2019   venous leg    APPENDECTOMY     COLON SURGERY     Perforated colon repair after colostomy   EYE SURGERY Bilateral 2022   GANGLION CYST EXCISION     HYSTEROSCOPY WITH D & C N/A 01/13/2014   Procedure: DILATATION AND CURETTAGE /HYSTEROSCOPY with resection ;  Surgeon: Levi Aland, MD;  Location: [facility];  Service: Gynecology;  Laterality: N/A;   LAPAROTOMY     PORTACATH PLACEMENT N/A 09/13/2020   Procedure: INSERTION PORT-A-CATH WITH ULTRASOUND GUIDANCE;  Surgeon: Emelia Loron, MD;  Location:  [facility];  Service: General;  Laterality: N/A;   Social History   Social History Narrative   Not on file   Immunization History  Administered Date(s) Administered    PFIZER Comirnaty(Gray Top)Covid-19 Tri-Sucrose Vaccine 08/11/2020     Objective: Vital Signs: BP 120/76 (BP Location: Left Arm, Patient Position: Sitting, Cuff Size: Normal)   Resp 13   Ht 5\' 4"  (1.626 m)   Wt 115 lb (52.2 kg) Comment: per patient  BMI 19.74 kg/m    Physical Exam Vitals and nursing note reviewed.  Constitutional:      Appearance: She is well-developed.  HENT:     Head: Normocephalic and atraumatic.  Eyes:     Conjunctiva/sclera: Conjunctivae normal.  Cardiovascular:     Rate and Rhythm: Normal rate and regular rhythm.     Heart sounds: Normal heart sounds.  Pulmonary:     Effort: Pulmonary effort is normal.     Breath sounds: Normal breath sounds.  Abdominal:     General: Bowel sounds are normal.     Palpations: Abdomen is soft.  Musculoskeletal:     Cervical back: Normal range  of motion.  Lymphadenopathy:     Cervical: No cervical adenopathy.  Skin:    General: Skin is warm and dry.     Capillary Refill: Capillary refill takes less than 2 seconds.  Neurological:     Mental Status: She is alert and oriented to person, place, and time.  Psychiatric:        Behavior: Behavior normal.      Musculoskeletal Exam: C-spine, thoracic spine, lumbar spine and good range of motion.  Shoulder joints, elbow joints, wrist joints, MCPs, PIPs, DIPs have good range of motion with no synovitis.  Synovial thickening over bilateral 1st through 3rd MCP joints.  PIP and DIP thickening noted.  Hip joints have good range of motion with no groin pain.  Knee joints have good range of motion with no effusion noted today.  Ankle joints have good range of motion with no tenderness or joint swelling.  CDAI Exam: CDAI Score: -- Patient Global: --; Provider Global: -- Swollen: --; Tender: -- Joint Exam 10/30/2023   No joint exam has been documented for this visit   There is currently no information documented on the homunculus. Go to the Rheumatology activity and complete the  homunculus joint exam.  Investigation: No additional findings.  Imaging: CT CHEST ABDOMEN PELVIS W CONTRAST Result Date: 10/20/2023 CLINICAL DATA:  Stage IV breast cancer with metastatic disease. On chemotherapy and immunotherapy. Nausea for a couple of days. * Tracking Code: BO * EXAM: CT CHEST, ABDOMEN, AND PELVIS WITH CONTRAST TECHNIQUE: Multidetector CT imaging of the chest, abdomen and pelvis was performed following the standard protocol during bolus administration of intravenous contrast. RADIATION DOSE REDUCTION: This exam was performed according to the departmental dose-optimization program which includes automated exposure control, adjustment of the mA and/or kV according to patient size and/or use of iterative reconstruction technique. CONTRAST:  OMNIPAQUE IOHEXOL 300 MG/ML  SOLN COMPARISON:  CT 07/11/2023. older exams as well.  PET-CT 06/02/2019 FINDINGS: CT CHEST FINDINGS Cardiovascular: No significant vascular findings. Normal heart size. No pericardial effusion. Right IJ chest port in place with the tip seen as far as the SVC right atrial junction. Scattered atherosclerotic changes along the aorta as on prior Mediastinum/Nodes: Normal caliber thoracic esophagus. Slightly heterogeneous thyroid gland. No specific abnormal lymph node enlargement seen in the axillary regions, hilum or mediastinum, internal mammary chain, thoracic inlet, chest wall or retrocrural. Lungs/Pleura: No consolidation, pneumothorax or effusion. There are some dependent linear opacity seen at the bases likely scar or atelectasis. Once again there are some calcified lung nodules consistent with old granulomatous disease. Some tiny noncalcified nodule seen such as 3 mm medial right lung apex on series 6, image 35 which are unchanged from previous. No new dominant lung mass. Musculoskeletal: Right-sided breast mass again identified. Previously measuring 2.1 x 1.6 cm and today 2.2 by 1.8 cm. Central biopsy clip. Please  correlate with mammographic evaluation and location of primary neoplasm. Scattered degenerative changes along the spine. CT ABDOMEN PELVIS FINDINGS Hepatobiliary: Patent portal vein. Gallbladder has a dependent stone is nondilated. Multiple liver metastases are again seen. This has scattered diffusely in the liver and were previously hypermetabolic. Specific lesions will be followed for continuity. This includes the segment 7 lesion which previously measured 16 x 12 mm and today fifteen by 11 mm on series 2, image 49. Overall distribution is similar to previous. Several lesions show more low-density than previous. Pancreas: Unremarkable. No pancreatic ductal dilatation or surrounding inflammatory changes. Spleen: Normal in size without focal abnormality.  Adrenals/Urinary Tract: Adrenal glands are preserved. No enhancing renal mass or collecting system dilatation. The ureters have normal course and caliber extending down to the urinary bladder. Preserved contours of the urinary bladder Stomach/Bowel: There is some oral contrast seen along the stomach and small bowel. These structures are nondilated. Large bowel has moderate colonic stool. Large bowel has a normal course and caliber Vascular/Lymphatic: Aortic atherosclerosis. No enlarged abdominal or pelvic lymph nodes. Reproductive: Uterus and bilateral adnexa are unremarkable. Other: No ascites. Previously there are some peritoneal nodules identified. For example along the left pericolic gutter measuring 9 8 mm and today similar lesion measures 8 mm on series 2, image 85. However there are increasing other areas of peritoneal nodules such as anterior to the left psoas muscle on series 2, image 81 measuring 19 x 14 mm, dependent pelvis posterior to the uterus on series 2, image 107. Musculoskeletal: Scattered degenerative changes of the spine and pelvis. Subtle lesions identified corresponding to areas of increased uptake on the remote PET-CT consistent with known  bony metastatic lesions. IMPRESSION: Known breast mass on the right consistent with a neoplasm similar to previous. Multiple liver metastases are seen. Extent and distribution is similar to previous. A few these are lower in density than prior may represent some necrosis. Scattered peritoneal nodules identified. Though seen previously appears similar however there are some increased number of lesions identified consistent with progression. No ascites. No developing new lymph node enlargement in the chest, abdomen and pelvis. Gallstones. Scattered mild lytic bone lesions are again seen and has a similar distribution by CT. Electronically Signed   By: Karen Kays M.D.   On: 10/20/2023 16:02    Recent Labs: Lab Results  Component Value Date   WBC 6.6 10/14/2023   HGB 13.4 10/14/2023   PLT 140 (L) 10/14/2023   NA 137 10/14/2023   K 4.1 10/14/2023   CL 102 10/14/2023   CO2 31 10/14/2023   GLUCOSE 95 10/14/2023   BUN 16 10/14/2023   CREATININE 0.85 10/14/2023   BILITOT 0.8 10/14/2023   ALKPHOS 71 10/14/2023   AST 32 10/14/2023   ALT 29 10/14/2023   PROT 6.4 (L) 10/14/2023   ALBUMIN 4.2 10/14/2023   CALCIUM 8.9 10/14/2023   GFRAA 95 07/28/2020   QFTBGOLDPLUS Negative 12/19/2021    Speciality Comments: PLQ Eye Exam: 04/23/2023 WNL @ [facility] follow up in 1 year. stage IB invasive ductal carcinoma, grade 3 08/16/20  Procedures:  No procedures performed Allergies  Beef-derived drug products, Latex, Sulfa antibiotics, and Tape   Assessment / Plan:     Visit Diagnoses: SLE (systemic lupus erythematosus related syndrome) (HCC) - Positive ANA and positive double-stranded DNA, hypocomplementemia, inflammatory arthritis, raynauds phenomenon and discoid lupus: She has not had any signs or symptoms of a flare.  Patient continues to experience chronic fatigue, intermittent nasal ulcers, intermittent symptoms of Raynaud's phenomenon, and ongoing arthralgias.  Her joint pain has been  most severe in both hands and both knee joints.  She has been taking Plaquenil 200 mg 1 tablet by mouth daily and takes tramadol as needed for pain relief. Patient plans on having updated lab work today so orders were released for urine protein creatinine ratio, ANA, sed rate, complements, double-stranded DNA.  CBC and CMP were updated on 10/14/2023 and results were reviewed with the patient today in the office.  Patient will remain on Plaquenil as prescribed.  She is vies notify us if she develops any new or worsening symptoms.  She  will follow-up in the office in 5 months or sooner if needed. - Plan: Protein / creatinine ratio, urine, ANA, Sedimentation rate, C3 and C4, Anti-DNA antibody, double-stranded  Chronic inflammatory arthritis - RF negative: Patient continues to have chronic pain and intermittent inflammation involving both hands and both knee joints.  She has been taking Plaquenil 200 mg 1 tablet by mouth daily and tramadol as needed for symptomatic relief.  She has synovial thickening of bilateral 1st through 3rd MCP joints as well as PIP and DIP thickening.  Discussed the use of arthritis compression gloves.  No knee joint effusions noted today.  She will remain on Plaquenil 200 mg 1 tablet by mouth daily.  She was advised to notify us if she develops signs or symptoms of a flare.  She will follow-up in the office in 5 months or sooner if needed.  High risk medication use - Plaquenil 200 mg 1 tablet by mouth daily. CBC and CMP updated on 10/14/23.  PLQ Eye Exam: 04/23/2023 WNL @ [facility] follow up in 1 year.   Discoid lupus: No discoid lesions at this time.   Anticardiolipin antibody positive - History of DVT and PE- Patient remains on warfarin as prescribed.  Malignant neoplasm of lower-outer quadrant of right breast of female, estrogen receptor positive (HCC) - Dr. Pamelia Hoit.  Patient had an updated PET/CT on 06/02/2023 which revealed interval progression of hepatic metastasis as  well as progression of bone metastasis. Under care of Dr. Sarajane Jews office visit note from 10/21/2023--CA 27-29 showing response-1933 on 10/14/23.  Current regimen: Xeloda 1000 g p.o. 2 tablets in morning and 1 tablet in evening 14 days on 7 days off. Started 07/13/2023  Recommended continuing current treatment regimen and to repeat CT and lab work in February 2025.  Raynaud's disease without gangrene: She continues to have intermittent symptoms of raynaud's phenomenon.  Discussed the importance of keeping her core body temperature warm as well as wearing gloves and socks throughout the winter months.  Primary osteoarthritis of both hands: PIP and DIP thickening.  Thickening of bilateral 1st-3rd MCP joints.  Discussed the use of arthritis compression gloves.   Primary osteoarthritis of both knees: Chronic pain.  No effusion noted today.   Primary insomnia: No nocturnal joint pain at this time.   Vitamin D deficiency: She is taking vitamin D 3000 units daily.   Other osteoporosis without current pathological fracture - DEXA updated on 05/10/2021: AP spine BMD 0.761 with T score -2.6.  She has been taking vitamin D 3000 units daily  Other medical conditions are listed as follows:   History of pulmonary embolism  History of DVT (deep vein thrombosis)  History of IBS  History of anxiety  Port-A-Cath in place  Family history of hemochromatosis   Orders: No orders of the defined types were placed in this encounter.  No orders of the defined types were placed in this encounter.    Follow-Up Instructions: Return in about 5 months (around 03/29/2024) for Systemic lupus erythematosus.   Gearldine Bienenstock, PA-C  Note - This record has been created using Dragon software.  Chart creation errors have been sought, but may not always  have been located. Such creation errors do not reflect on  the standard of medical care.

## 2023-10-20 ENCOUNTER — Telehealth: Payer: Self-pay

## 2023-10-20 NOTE — Telephone Encounter (Signed)
Placed call to [facility] reading room to request STAT read on scan. S/w Elnita Maxwell.

## 2023-10-20 NOTE — Telephone Encounter (Signed)
Pt called concerned that her scan results are not back. Advised pt we called [facility] for stat read and we will obtain results before her appt tomorrow. She also inquired about port flush since she has not had it flushed since 08/2023. Appt made for port flush per MD.

## 2023-10-21 ENCOUNTER — Inpatient Hospital Stay: Payer: 59

## 2023-10-21 ENCOUNTER — Inpatient Hospital Stay (HOSPITAL_BASED_OUTPATIENT_CLINIC_OR_DEPARTMENT_OTHER): Payer: 59 | Admitting: Hematology and Oncology

## 2023-10-21 VITALS — BP 119/67 | HR 69 | Temp 98.0°F | Resp 18 | Ht 64.0 in | Wt 115.7 lb

## 2023-10-21 DIAGNOSIS — Z170 Estrogen receptor positive status [ER+]: Secondary | ICD-10-CM | POA: Diagnosis not present

## 2023-10-21 DIAGNOSIS — Z95828 Presence of other vascular implants and grafts: Secondary | ICD-10-CM

## 2023-10-21 DIAGNOSIS — C50511 Malignant neoplasm of lower-outer quadrant of right female breast: Secondary | ICD-10-CM

## 2023-10-21 DIAGNOSIS — C787 Secondary malignant neoplasm of liver and intrahepatic bile duct: Secondary | ICD-10-CM | POA: Diagnosis not present

## 2023-10-21 MED ORDER — SODIUM CHLORIDE 0.9% FLUSH
10.0000 mL | Freq: Once | INTRAVENOUS | Status: AC
  Administered 2023-10-21: 10 mL

## 2023-10-21 MED ORDER — HEPARIN SOD (PORK) LOCK FLUSH 100 UNIT/ML IV SOLN
500.0000 [IU] | Freq: Once | INTRAVENOUS | Status: AC
  Administered 2023-10-21: 500 [IU]

## 2023-10-21 NOTE — Assessment & Plan Note (Signed)
08/04/2020: Right breast biopsy T2 N0 M1 stage IV IDC grade 3, triple positive with a Ki-67 of 40%, multiple liver lesions biopsy was positive for breast cancer ER/PR positive HER2 negative with a Ki-67 30% 08/31/2020-12/14/2020: TCHP x6 01/29/2021: Letrozole and palbociclib   11/29/2022-06/05/2023: Olaparib   10/24/2022: CT CAP: Multiple new hepatic lesions measuring up to 1.5 cm consistent with metastatic disease, stable 5 mm nodule left costophrenic sulcus 12/05/2021: Guardant360: ATM gene mutation (possible treatment option olaparib), MSI high: Not detected.  Will refer the patient to genetic testing   Lab review: CA 27-29: 1365 it was 1159 01/28/2023 (was 1049 and previously it was 385 on 10/16/2022)   PET CT scan 03/03/2023: Widespread hypermetabolic metastatic disease within the liver is similar to previous PET/CT, some of the lesions have mildly decreased in size and metabolic activity consistent with response to treatment.  Small lytic lesions right acromion and right inferior pubic ramus PET/CT 06/02/2023: Interval progression of hepatic metastases.  Progression of bone metastases.  Suspicion of peritoneal disease.   06/13/2023: Guardant360: ATM, T p53, FGF R1 amplification, TMB 4.73 mutations   Recommendation: Xeloda 1000 g p.o. twice daily 14 days on 7 days off.  Started 07/13/2023 Xeloda toxicities:  Fatigue Nausea: Taking nausea medications   CT CAP 07/16/2023: Interval decrease in the hypodensity in the liver stable bone metastases and stable peritoneal nodules CT CAP 10/20/2023: Breast mass similar, multiple liver metastases similar, scattered peritoneal nodules (some increase noted), bone lesions similar to prior.  Tumor marker: CA 27-29: Showing response. 06/03/2023: 3332 10/14/2023: 1933  Therefore I recommend continuation of current treatment with recheck with another CT scan in 2 months   Return to clinic in 2 months with CT chest abdomen pelvis and follow-up after that (Other options  include Enhertu and Faslodex with Verzinio were also discussed)

## 2023-10-21 NOTE — Progress Notes (Signed)
Patient Care Team: Lavada Mesi, MD as PCP - General (Family Medicine) Peggyann Shoals, Leda Roys, MD as Referring Physician (Oncology) Donnelly Angelica, RN as Oncology Nurse Navigator Pershing Proud, RN as Oncology Nurse Navigator Emelia Loron, MD as Consulting Physician (General Surgery) Dorothy Puffer, MD as Consulting Physician (Radiation Oncology) Pollyann Savoy, MD as Consulting Physician (Rheumatology) Levi Aland, MD as Consulting Physician (Obstetrics and Gynecology) Laurey Morale, MD as Consulting Physician (Cardiology) Armbruster, Willaim Rayas, MD as Consulting Physician (Gastroenterology) Roberts Gaudy, MD as Referring Physician (Ophthalmology) Roberts Gaudy, MD as Referring Physician (Ophthalmology) Serena Croissant, MD as Consulting Physician (Hematology and Oncology) Anselm Lis, RPH-CPP as Pharmacist (Hematology and Oncology)  DIAGNOSIS:  Encounter Diagnosis  Name Primary?   Malignant neoplasm of lower-outer quadrant of right breast of female, estrogen receptor positive (HCC) Yes    SUMMARY OF ONCOLOGIC HISTORY: Oncology History  Malignant neoplasm of lower-outer quadrant of right breast of female, estrogen receptor positive (HCC)  08/04/2020 Initial Diagnosis   Right breast lower outer quadrant biopsy 08/04/2020 for a clinical T2 N0 M1, stage IV invasive ductal carcinoma, grade 3, triple positive, with an MIB-1 of 40%. abdominal MRI 08/24/2020 shows multiple liver lesions highly suspicious for liver metastases. Liver biopsy 09/20/2020 positive for carcinoma, prognostic panel estrogen and progesterone receptor positive, but HER-2 negative (0); MIB-1 of 30%   08/31/2020 - 12/14/2020 Chemotherapy   Neoadjuvant chemotherapy consisting of docetaxel, carboplatin, trastuzumab and pertuzumab every 21 days x 6    12/26/2020 Imaging   breast MRI 12/26/2020 showed right breast index lesion decreased to 0.8 cm; 2 additional masses in the right breast  stable abdominal MRI 01/29/2021 shows no active disease in the liver    Chemotherapy   Herceptin Maintenance initially q 3 weeks, now q 4 weeks   01/29/2021 -  Anti-estrogen oral therapy   anastrozole and palbociclib    01/29/2023 Genetic Testing   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  Report date is 01/29/2023.   The Multi-Cancer + RNA Panel offered by [facility] includes sequencing and/or deletion/duplication analysis of the following 70 genes:  AIP*, ALK, APC*, ATM*, AXIN2*, BAP1*, BARD1*, BLM*, BMPR1A*, BRCA1*, BRCA2*, BRIP1*, CDC73*, CDH1*, CDK4, CDKN1B*, CDKN2A, CHEK2*, CTNNA1*, DICER1*, EPCAM (del/dup only), EGFR, FH*, FLCN*, GREM1 (promoter dup only), HOXB13, KIT, LZTR1, MAX*, MBD4, MEN1*, MET, MITF, MLH1*, MSH2*, MSH3*, MSH6*, MUTYH*, NF1*, NF2*, NTHL1*, PALB2*, PDGFRA, PMS2*, POLD1*, POLE*, POT1*, PRKAR1A*, PTCH1*, PTEN*, RAD51C*, RAD51D*, RB1*, RET, SDHA* (sequencing only), SDHAF2*, SDHB*, SDHC*, SDHD*, SMAD4*, SMARCA4*, SMARCB1*, SMARCE1*, STK11*, SUFU*, TMEM127*, TP53*, TSC1*, TSC2*, VHL*. RNA analysis is performed for * genes.   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  Report date is 01/29/2023.   The Multi-Cancer + RNA Panel offered by [facility] includes sequencing and/or deletion/duplication analysis of the following 70 genes:  AIP*, ALK, APC*, ATM*, AXIN2*, BAP1*, BARD1*, BLM*, BMPR1A*, BRCA1*, BRCA2*, BRIP1*, CDC73*, CDH1*, CDK4, CDKN1B*, CDKN2A, CHEK2*, CTNNA1*, DICER1*, EPCAM (del/dup only), EGFR, FH*, FLCN*, GREM1 (promoter dup only), HOXB13, KIT, LZTR1, MAX*, MBD4, MEN1*, MET, MITF, MLH1*, MSH2*, MSH3*, MSH6*, MUTYH*, NF1*, NF2*, NTHL1*, PALB2*, PDGFRA, PMS2*, POLD1*, POLE*, POT1*, PRKAR1A*, PTCH1*, PTEN*,  RAD51C*, RAD51D*, RB1*, RET, SDHA* (sequencing only), SDHAF2*, SDHB*, SDHC*, SDHD*, SMAD4*, SMARCA4*, SMARCB1*, SMARCE1*, STK11*, SUFU*, HYQM578*,  TP53*, TSC1*, TSC2*, VHL*. RNA analysis is performed for * genes.     CHIEF COMPLIANT: Follow-up of scans on Xeloda  HISTORY OF PRESENT ILLNESS:  History of Present Illness   The patient, with a history of breast cancer with liver metastasis, presents for a follow-up visit after a recent CT scan. The patient reports that the mass in the breast is smaller and the liver lesions have also decreased in size. However, there are some new nodules in the abdomen, which could indicate disease progression, but this is uncertain due to the difficulty of imaging the abdomen. The patient's tumor marker levels are decreasing, which suggests that the current treatment is effective.  The patient is currently on Xeloda (capecitabine) treatment and reports side effects including dry and cracking hands, a condition known as hand-foot syndrome, and increased gas. The patient also has a history of lupus, which could be contributing to the presence of calcified lung nodules seen on the CT scan.         ALLERGIES:  is allergic to beef-derived drug products, latex, sulfa antibiotics, and tape.  MEDICATIONS:  Current Outpatient Medications  Medication Sig Dispense Refill   acetaminophen (TYLENOL) 500 MG tablet Take 1,000 mg by mouth every 6 (six) hours as needed for moderate pain or headache.     amphetamine-dextroamphetamine (ADDERALL XR) 20 MG 24 hr capsule Take 1 capsule (20 mg total) by mouth daily. 30 capsule 0   Ascorbic Acid (VITAMIN C PO) Take 3,000 mg by mouth daily.     ascorbic acid (VITAMIN C) 500 MG tablet Take by mouth.     capecitabine (XELODA) 500 MG tablet Take 2 tablets (1,000 mg total) by mouth 2 (two) times daily after a meal. Take for 14 days and then off for 7 days then repeat every 21 days. 56 tablet 6   Cholecalciferol (VITAMIN D3) 75 MCG (3000  UT) TABS Take 3,000 Units by mouth daily.     Clindamycin-Benzoyl Per, Refr, gel Apply topically.     clobetasol cream (TEMOVATE) 0.05 % Apply 1 Application topically 2 (two) times daily. Apply to affected area. 30 g 0   diclofenac Sodium (VOLTAREN) 1 % GEL Research Patient: Apply 0.5 grams (1 fingertip) to each hand and each foot twice daily for up to 12 weeks 400 g 0   estradiol (ESTRACE) 0.1 MG/GM vaginal cream INSERT 1 APPLICATORFUL VAGINALLY 3 TIMES A WEEK 42.5 g 12   famotidine (PEPCID) 20 MG tablet Take 20 mg by mouth at bedtime.     hydroxychloroquine (PLAQUENIL) 200 MG tablet TAKE 1 TABLET(200 MG) BY MOUTH DAILY 90 tablet 0   letrozole (FEMARA) 2.5 MG tablet Take 1 tablet (2.5 mg total) by mouth daily. 90 tablet 3   lidocaine-prilocaine (EMLA) cream Apply topically.     nystatin-triamcinolone ointment (MYCOLOG) Apply topically.     ondansetron (ZOFRAN) 8 MG tablet Take 1 tablet (8 mg total) by mouth every 8 (eight) hours as needed for nausea or vomiting. 30 tablet 6   spironolactone (ALDACTONE) 25 MG tablet Take 25 mg by mouth daily as needed (swelling).     traMADol (ULTRAM) 50 MG tablet Take 1-2 tablets (50-100 mg total) by mouth every 6 (six) hours as needed. 60 tablet 0   tretinoin (RETIN-A) 0.1 % cream Apply 1 application  topically at bedtime as needed (acne).     triamterene-hydrochlorothiazide (DYAZIDE) 37.5-25 MG capsule TAKE 1 CAPSULE BY MOUTH DAILY 90 capsule 1   warfarin (COUMADIN) 5 MG tablet TAKE 1  TABLET(5 MG) BY MOUTH DAILY 30 tablet PRN   No current facility-administered medications for this visit.    PHYSICAL EXAMINATION: ECOG PERFORMANCE STATUS: 1 - Symptomatic but completely ambulatory  Vitals:   10/21/23 1124  BP: 119/67  Pulse: 69  Resp: 18  Temp: 98 F (36.7 C)  SpO2: 100%   Filed Weights   10/21/23 1124  Weight: 115 lb 11.2 oz (52.5 kg)    Physical Exam          (exam performed in the presence of a chaperone)  LABORATORY DATA:  I have  reviewed the data as listed    Latest Ref Rng & Units 10/14/2023   11:25 AM 08/14/2023    1:46 PM 07/17/2023   11:11 AM  CMP  Glucose 70 - 99 mg/dL 95  85  87   BUN 6 - 20 mg/dL 16  11  10    Creatinine 0.44 - 1.00 mg/dL 2.44  0.10  2.72   Sodium 135 - 145 mmol/L 137  137  139   Potassium 3.5 - 5.1 mmol/L 4.1  4.1  4.4   Chloride 98 - 111 mmol/L 102  105  105   CO2 22 - 32 mmol/L 31  28  30    Calcium 8.9 - 10.3 mg/dL 8.9  8.9  8.9   Total Protein 6.5 - 8.1 g/dL 6.4  6.6  6.6   Total Bilirubin <1.2 mg/dL 0.8  0.8  0.4   Alkaline Phos 38 - 126 U/L 71  77  95   AST 15 - 41 U/L 32  33  20   ALT 0 - 44 U/L 29  28  13      Lab Results  Component Value Date   WBC 6.6 10/14/2023   HGB 13.4 10/14/2023   HCT 40.0 10/14/2023   MCV 104.2 (H) 10/14/2023   PLT 140 (L) 10/14/2023   NEUTROABS 4.5 10/14/2023    ASSESSMENT & PLAN:  Malignant neoplasm of lower-outer quadrant of right breast of female, estrogen receptor positive (HCC) 08/04/2020: Right breast biopsy T2 N0 M1 stage IV IDC grade 3, triple positive with a Ki-67 of 40%, multiple liver lesions biopsy was positive for breast cancer ER/PR positive HER2 negative with a Ki-67 30% 08/31/2020-12/14/2020: TCHP x6 01/29/2021: Letrozole and palbociclib   11/29/2022-06/05/2023: Olaparib   10/24/2022: CT CAP: Multiple new hepatic lesions measuring up to 1.5 cm consistent with metastatic disease, stable 5 mm nodule left costophrenic sulcus 12/05/2021: Guardant360: ATM gene mutation (possible treatment option olaparib), MSI high: Not detected.  Will refer the patient to genetic testing   Lab review: CA 27-29: 1365 it was 1159 01/28/2023 (was 1049 and previously it was 385 on 10/16/2022)   PET CT scan 03/03/2023: Widespread hypermetabolic metastatic disease within the liver is similar to previous PET/CT, some of the lesions have mildly decreased in size and metabolic activity consistent with response to treatment.  Small lytic lesions right acromion and right  inferior pubic ramus PET/CT 06/02/2023: Interval progression of hepatic metastases.  Progression of bone metastases.  Suspicion of peritoneal disease.   06/13/2023: Guardant360: ATM, T p53, FGF R1 amplification, TMB 4.73 mutations   Recommendation: Xeloda 1000 g p.o. twice daily 14 days on 7 days off.  Started 07/13/2023 Xeloda toxicities:  Fatigue Nausea: Taking nausea medications   CT CAP 07/16/2023: Interval decrease in the hypodensity in the liver stable bone metastases and stable peritoneal nodules CT CAP 10/20/2023: Breast mass similar, multiple liver metastases similar, scattered peritoneal nodules (some  increase noted), bone lesions similar to prior.  Tumor marker: CA 27-29: Showing response. 06/03/2023: 3332 10/14/2023: 1933  Therefore I recommend continuation of current treatment with recheck with another CT scan in 2 months   Hand-Foot Syndrome Dry, cracking skin on hands, likely secondary to Xeloda. -Reduce Xeloda dose to two pills in the morning and one pill in the evening. -Apply O'Keeffe's cream twice daily and moisturize hands with lotion such as Cerave or Calamine at least two additional times daily.  Gastrointestinal Side Effects Patient reports increased gas, likely secondary to Xeloda. No diarrhea reported. -Continue current treatment with Xeloda. -Consider dietary modifications to reduce gas.  Follow-up Labs, imaging, and office visit scheduled for early February.          Orders Placed This Encounter  Procedures   CT CHEST ABDOMEN PELVIS W CONTRAST    Standing Status:   Future    Standing Expiration Date:   10/20/2024    Order Specific Question:   If indicated for the ordered procedure, I authorize the administration of contrast media per Radiology protocol    Answer:   Yes    Order Specific Question:   Does the patient have a contrast media/X-ray dye allergy?    Answer:   No    Order Specific Question:   Is patient pregnant?    Answer:   No    Order  Specific Question:   Preferred imaging location?    Answer:   [facility]    Order Specific Question:   Release to patient    Answer:   Immediate    Order Specific Question:   If indicated for the ordered procedure, I authorize the administration of oral contrast media per Radiology protocol    Answer:   Yes   CT CHEST ABDOMEN PELVIS W CONTRAST    Standing Status:   Future    Standing Expiration Date:   10/20/2024    Order Specific Question:   If indicated for the ordered procedure, I authorize the administration of contrast media per Radiology protocol    Answer:   Yes    Order Specific Question:   Does the patient have a contrast media/X-ray dye allergy?    Answer:   No    Order Specific Question:   Is patient pregnant?    Answer:   No    Order Specific Question:   Preferred imaging location?    Answer:   [facility]    Order Specific Question:   Release to patient    Answer:   Immediate    Order Specific Question:   If indicated for the ordered procedure, I authorize the administration of oral contrast media per Radiology protocol    Answer:   Yes   CBC with Differential (Cancer Center Only)    Standing Status:   Future    Standing Expiration Date:   10/20/2024   CMP (Cancer Center only)    Standing Status:   Future    Standing Expiration Date:   10/20/2024   CA 27.29    Standing Status:   Future    Standing Expiration Date:   10/20/2024   The patient has a good understanding of the overall plan. she agrees with it. she will call with any problems that may develop before the next visit here. Total time spent: 30 mins including face to face time and time spent for planning, charting and co-ordination of care   Tamsen Meek, MD 10/21/23

## 2023-10-23 ENCOUNTER — Other Ambulatory Visit: Payer: Self-pay | Admitting: Hematology and Oncology

## 2023-10-23 ENCOUNTER — Telehealth: Payer: Self-pay | Admitting: Hematology and Oncology

## 2023-10-23 NOTE — Telephone Encounter (Signed)
Spoke with patient confirming upcoming appointment  

## 2023-10-24 ENCOUNTER — Ambulatory Visit: Payer: 59 | Admitting: Hematology and Oncology

## 2023-10-30 ENCOUNTER — Encounter: Payer: Self-pay | Admitting: Physician Assistant

## 2023-10-30 ENCOUNTER — Ambulatory Visit: Payer: 59 | Attending: Physician Assistant | Admitting: Physician Assistant

## 2023-10-30 VITALS — BP 120/76 | Resp 13 | Ht 64.0 in | Wt 115.0 lb

## 2023-10-30 DIAGNOSIS — R760 Raised antibody titer: Secondary | ICD-10-CM

## 2023-10-30 DIAGNOSIS — E559 Vitamin D deficiency, unspecified: Secondary | ICD-10-CM

## 2023-10-30 DIAGNOSIS — M170 Bilateral primary osteoarthritis of knee: Secondary | ICD-10-CM

## 2023-10-30 DIAGNOSIS — Z86711 Personal history of pulmonary embolism: Secondary | ICD-10-CM

## 2023-10-30 DIAGNOSIS — I7300 Raynaud's syndrome without gangrene: Secondary | ICD-10-CM

## 2023-10-30 DIAGNOSIS — Z79899 Other long term (current) drug therapy: Secondary | ICD-10-CM | POA: Diagnosis not present

## 2023-10-30 DIAGNOSIS — C50511 Malignant neoplasm of lower-outer quadrant of right female breast: Secondary | ICD-10-CM

## 2023-10-30 DIAGNOSIS — L930 Discoid lupus erythematosus: Secondary | ICD-10-CM

## 2023-10-30 DIAGNOSIS — M818 Other osteoporosis without current pathological fracture: Secondary | ICD-10-CM

## 2023-10-30 DIAGNOSIS — Z8719 Personal history of other diseases of the digestive system: Secondary | ICD-10-CM

## 2023-10-30 DIAGNOSIS — M1990 Unspecified osteoarthritis, unspecified site: Secondary | ICD-10-CM | POA: Diagnosis not present

## 2023-10-30 DIAGNOSIS — Z8659 Personal history of other mental and behavioral disorders: Secondary | ICD-10-CM

## 2023-10-30 DIAGNOSIS — M19042 Primary osteoarthritis, left hand: Secondary | ICD-10-CM

## 2023-10-30 DIAGNOSIS — Z95828 Presence of other vascular implants and grafts: Secondary | ICD-10-CM

## 2023-10-30 DIAGNOSIS — Z8349 Family history of other endocrine, nutritional and metabolic diseases: Secondary | ICD-10-CM

## 2023-10-30 DIAGNOSIS — Z86718 Personal history of other venous thrombosis and embolism: Secondary | ICD-10-CM

## 2023-10-30 DIAGNOSIS — Z170 Estrogen receptor positive status [ER+]: Secondary | ICD-10-CM

## 2023-10-30 DIAGNOSIS — M19041 Primary osteoarthritis, right hand: Secondary | ICD-10-CM

## 2023-10-30 DIAGNOSIS — M329 Systemic lupus erythematosus, unspecified: Secondary | ICD-10-CM | POA: Diagnosis not present

## 2023-10-30 DIAGNOSIS — F5101 Primary insomnia: Secondary | ICD-10-CM

## 2023-10-30 NOTE — Patient Instructions (Signed)
Back Exercises These exercises help to make your trunk and back strong. They also help to keep the lower back flexible. Doing these exercises can help to prevent or lessen pain in your lower back. If you have back pain, try to do these exercises 2-3 times each day or as told by your doctor. As you get better, do the exercises once each day. Repeat the exercises more often as told by your doctor. To stop back pain from coming back, do the exercises once each day, or as told by your doctor. Do exercises exactly as told by your doctor. Stop right away if you feel sudden pain or your pain gets worse. Exercises Single knee to chest Do these steps 3-5 times in a row for each leg: Lie on your back on a firm bed or the floor with your legs stretched out. Bring one knee to your chest. Grab your knee or thigh with both hands and hold it in place. Pull on your knee until you feel a gentle stretch in your lower back or butt. Keep doing the stretch for 10-30 seconds. Slowly let go of your leg and straighten it. Pelvic tilt Do these steps 5-10 times in a row: Lie on your back on a firm bed or the floor with your legs stretched out. Bend your knees so they point up to the ceiling. Your feet should be flat on the floor. Tighten your lower belly (abdomen) muscles to press your lower back against the floor. This will make your tailbone point up to the ceiling instead of pointing down to your feet or the floor. Stay in this position for 5-10 seconds while you gently tighten your muscles and breathe evenly. Cat-cow Do these steps until your lower back bends more easily: Get on your hands and knees on a firm bed or the floor. Keep your hands under your shoulders, and keep your knees under your hips. You may put padding under your knees. Let your head hang down toward your chest. Tighten (contract) the muscles in your belly. Point your tailbone toward the floor so your lower back becomes rounded like the back of a  cat. Stay in this position for 5 seconds. Slowly lift your head. Let the muscles of your belly relax. Point your tailbone up toward the ceiling so your back forms a sagging arch like the back of a cow. Stay in this position for 5 seconds.  Press-ups Do these steps 5-10 times in a row: Lie on your belly (face-down) on a firm bed or the floor. Place your hands near your head, about shoulder-width apart. While you keep your back relaxed and keep your hips on the floor, slowly straighten your arms to raise the top half of your body and lift your shoulders. Do not use your back muscles. You may change where you place your hands to make yourself more comfortable. Stay in this position for 5 seconds. Keep your back relaxed. Slowly return to lying flat on the floor.  Bridges Do these steps 10 times in a row: Lie on your back on a firm bed or the floor. Bend your knees so they point up to the ceiling. Your feet should be flat on the floor. Your arms should be flat at your sides, next to your body. Tighten your butt muscles and lift your butt off the floor until your waist is almost as high as your knees. If you do not feel the muscles working in your butt and the back of   your thighs, slide your feet 1-2 inches (2.5-5 cm) farther away from your butt. Stay in this position for 3-5 seconds. Slowly lower your butt to the floor, and let your butt muscles relax. If this exercise is too easy, try doing it with your arms crossed over your chest. Belly crunches Do these steps 5-10 times in a row: Lie on your back on a firm bed or the floor with your legs stretched out. Bend your knees so they point up to the ceiling. Your feet should be flat on the floor. Cross your arms over your chest. Tip your chin a little bit toward your chest, but do not bend your neck. Tighten your belly muscles and slowly raise your chest just enough to lift your shoulder blades a tiny bit off the floor. Avoid raising your body  higher than that because it can put too much stress on your lower back. Slowly lower your chest and your head to the floor. Back lifts Do these steps 5-10 times in a row: Lie on your belly (face-down) with your arms at your sides, and rest your forehead on the floor. Tighten the muscles in your legs and your butt. Slowly lift your chest off the floor while you keep your hips on the floor. Keep the back of your head in line with the curve in your back. Look at the floor while you do this. Stay in this position for 3-5 seconds. Slowly lower your chest and your face to the floor. Contact a doctor if: Your back pain gets a lot worse when you do an exercise. Your back pain does not get better within 2 hours after you exercise. If you have any of these problems, stop doing the exercises. Do not do them again unless your doctor says it is okay. Get help right away if: You have sudden, very bad back pain. If this happens, stop doing the exercises. Do not do them again unless your doctor says it is okay. This information is not intended to replace advice given to you by your health care provider. Make sure you discuss any questions you have with your health care provider. Document Revised: 01/10/2021 Document Reviewed: 01/10/2021 Elsevier Patient Education  2024 [employer]  

## 2023-10-31 NOTE — Progress Notes (Signed)
ANA negative  ESR WNL  Complements WNL  dsDNA is negative  Urine protein creatinine ratio pending

## 2023-11-01 LAB — PROTEIN / CREATININE RATIO, URINE
Creatinine, Urine: 46.1 mg/dL
Protein, Ur: 4.8 mg/dL
Protein/Creat Ratio: 104 mg/g{creat} (ref 0–200)

## 2023-11-01 LAB — SEDIMENTATION RATE: Sed Rate: 7 mm/h (ref 0–40)

## 2023-11-01 LAB — C3 AND C4
Complement C3, Serum: 97 mg/dL (ref 82–167)
Complement C4, Serum: 18 mg/dL (ref 12–38)

## 2023-11-01 LAB — ANTI-DNA ANTIBODY, DOUBLE-STRANDED: dsDNA Ab: 1 [IU]/mL (ref 0–9)

## 2023-11-01 LAB — ANA: Anti Nuclear Antibody (ANA): NEGATIVE

## 2023-11-02 NOTE — Progress Notes (Signed)
Urine protection creatinine ratio WNL

## 2023-11-03 ENCOUNTER — Other Ambulatory Visit: Payer: Self-pay | Admitting: *Deleted

## 2023-11-03 MED ORDER — AMPHETAMINE-DEXTROAMPHET ER 20 MG PO CP24: 20.0000 mg | ORAL_CAPSULE | Freq: Every day | ORAL | 0 refills | Status: DC

## 2023-11-03 NOTE — Telephone Encounter (Signed)
Last Fill: 10/06/2023  Next Visit: 04/08/2024  Last Visit: 10/30/2023  Dx: SLE (systemic lupus erythematosus related syndrome)   Current Dose per office note on 10/30/2023: not mentioned  Okay to refill Adderall?

## 2023-11-08 ENCOUNTER — Other Ambulatory Visit: Payer: Self-pay | Admitting: Hematology and Oncology

## 2023-11-08 DIAGNOSIS — C50511 Malignant neoplasm of lower-outer quadrant of right female breast: Secondary | ICD-10-CM

## 2023-11-10 ENCOUNTER — Telehealth: Payer: Self-pay | Admitting: Hematology and Oncology

## 2023-11-10 NOTE — Telephone Encounter (Signed)
Patient is aware of rescheduled appointment times/dates 

## 2023-11-26 ENCOUNTER — Encounter: Payer: Self-pay | Admitting: *Deleted

## 2023-12-03 ENCOUNTER — Encounter: Payer: Self-pay | Admitting: Hematology and Oncology

## 2023-12-05 ENCOUNTER — Telehealth: Payer: Self-pay

## 2023-12-05 NOTE — Telephone Encounter (Signed)
Notified Patient of completion of Accommodation Request Forms. Fax transmission confirmation received. Copy of forms placed for pick-up as requested by Patient. No other needs or concerns noted at this time.

## 2023-12-11 ENCOUNTER — Other Ambulatory Visit: Payer: Self-pay

## 2023-12-11 MED ORDER — AMPHETAMINE-DEXTROAMPHET ER 20 MG PO CP24: 20.0000 mg | ORAL_CAPSULE | Freq: Every day | ORAL | 0 refills | Status: DC

## 2023-12-11 NOTE — Telephone Encounter (Signed)
Patient contacted the office and requests a refill of Adderall be sent to [facility] on [address].   Last Fill: 11/03/2023  Next Visit: 04/08/2024  Last Visit: 10/30/2023  Dx: not mentioned  Current Dose per office note on 10/30/2023: not mentioned  Okay to refill Adderall?

## 2023-12-19 ENCOUNTER — Other Ambulatory Visit: Payer: 59

## 2023-12-19 ENCOUNTER — Ambulatory Visit (HOSPITAL_COMMUNITY)
Admission: RE | Admit: 2023-12-19 | Discharge: 2023-12-19 | Disposition: A | Discharge status: Home / Self Care | Payer: 59 | Source: Ambulatory Visit | Attending: Hematology and Oncology

## 2023-12-19 ENCOUNTER — Encounter (HOSPITAL_COMMUNITY): Payer: Self-pay

## 2023-12-19 ENCOUNTER — Inpatient Hospital Stay: Payer: 59 | Attending: Oncology

## 2023-12-19 DIAGNOSIS — Z1732 Human epidermal growth factor receptor 2 negative status: Secondary | ICD-10-CM | POA: Insufficient documentation

## 2023-12-19 DIAGNOSIS — Z170 Estrogen receptor positive status [ER+]: Secondary | ICD-10-CM | POA: Insufficient documentation

## 2023-12-19 DIAGNOSIS — Z79899 Other long term (current) drug therapy: Secondary | ICD-10-CM | POA: Insufficient documentation

## 2023-12-19 DIAGNOSIS — C50511 Malignant neoplasm of lower-outer quadrant of right female breast: Secondary | ICD-10-CM | POA: Insufficient documentation

## 2023-12-19 DIAGNOSIS — Z79811 Long term (current) use of aromatase inhibitors: Secondary | ICD-10-CM | POA: Diagnosis not present

## 2023-12-19 DIAGNOSIS — C786 Secondary malignant neoplasm of retroperitoneum and peritoneum: Secondary | ICD-10-CM | POA: Diagnosis not present

## 2023-12-19 DIAGNOSIS — Z1721 Progesterone receptor positive status: Secondary | ICD-10-CM | POA: Diagnosis not present

## 2023-12-19 DIAGNOSIS — C7951 Secondary malignant neoplasm of bone: Secondary | ICD-10-CM | POA: Diagnosis not present

## 2023-12-19 DIAGNOSIS — C787 Secondary malignant neoplasm of liver and intrahepatic bile duct: Secondary | ICD-10-CM | POA: Diagnosis present

## 2023-12-19 LAB — CBC WITH DIFFERENTIAL (CANCER CENTER ONLY)
Abs Immature Granulocytes: 0.01 10*3/uL (ref 0.00–0.07)
Basophils Absolute: 0.1 10*3/uL (ref 0.0–0.1)
Basophils Relative: 2 %
Eosinophils Absolute: 0 10*3/uL (ref 0.0–0.5)
Eosinophils Relative: 1 %
HCT: 40.1 % (ref 36.0–46.0)
Hemoglobin: 13.5 g/dL (ref 12.0–15.0)
Immature Granulocytes: 0 %
Lymphocytes Relative: 33 %
Lymphs Abs: 1.6 10*3/uL (ref 0.7–4.0)
MCH: 35.1 pg — ABNORMAL HIGH (ref 26.0–34.0)
MCHC: 33.7 g/dL (ref 30.0–36.0)
MCV: 104.2 fL — ABNORMAL HIGH (ref 80.0–100.0)
Monocytes Absolute: 0.4 10*3/uL (ref 0.1–1.0)
Monocytes Relative: 8 %
Neutro Abs: 2.7 10*3/uL (ref 1.7–7.7)
Neutrophils Relative %: 56 %
Platelet Count: 120 10*3/uL — ABNORMAL LOW (ref 150–400)
RBC: 3.85 MIL/uL — ABNORMAL LOW (ref 3.87–5.11)
RDW: 13.8 % (ref 11.5–15.5)
WBC Count: 4.8 10*3/uL (ref 4.0–10.5)
nRBC: 0 % (ref 0.0–0.2)

## 2023-12-19 LAB — CMP (CANCER CENTER ONLY)
ALT: 24 U/L (ref 0–44)
AST: 31 U/L (ref 15–41)
Albumin: 4.4 g/dL (ref 3.5–5.0)
Alkaline Phosphatase: 72 U/L (ref 38–126)
Anion gap: 3 — ABNORMAL LOW (ref 5–15)
BUN: 14 mg/dL (ref 6–20)
CO2: 30 mmol/L (ref 22–32)
Calcium: 8.9 mg/dL (ref 8.9–10.3)
Chloride: 104 mmol/L (ref 98–111)
Creatinine: 0.87 mg/dL (ref 0.44–1.00)
GFR, Estimated: >60 mL/min (ref >60)
Glucose, Bld: 83 mg/dL (ref 70–99)
Potassium: 4.1 mmol/L (ref 3.5–5.1)
Sodium: 137 mmol/L (ref 135–145)
Total Bilirubin: 0.6 mg/dL (ref 0.0–1.2)
Total Protein: 6.8 g/dL (ref 6.5–8.1)

## 2023-12-19 MED ORDER — IOHEXOL 300 MG/ML  SOLN
30.0000 mL | Freq: Once | INTRAMUSCULAR | Status: AC | PRN
  Administered 2023-12-19: 30 mL via ORAL

## 2023-12-19 MED ORDER — IOHEXOL 300 MG/ML  SOLN
100.0000 mL | Freq: Once | INTRAMUSCULAR | Status: AC | PRN
  Administered 2023-12-19: 100 mL via INTRAVENOUS

## 2023-12-20 LAB — CANCER ANTIGEN 27.29: CA 27.29: 1650.3 U/mL — ABNORMAL HIGH (ref 0.0–38.6)

## 2023-12-24 NOTE — Assessment & Plan Note (Signed)
08/04/2020: Right breast biopsy T2 N0 M1 stage IV IDC grade 3, triple positive with a Ki-67 of 40%, multiple liver lesions biopsy was positive for breast cancer ER/PR positive HER2 negative with a Ki-67 30% 08/31/2020-12/14/2020: TCHP x6 01/29/2021: Letrozole and palbociclib   11/29/2022-06/05/2023: Olaparib   10/24/2022: CT CAP: Multiple new hepatic lesions measuring up to 1.5 cm consistent with metastatic disease, stable 5 mm nodule left costophrenic sulcus 12/05/2021: Guardant360: ATM gene mutation (possible treatment option olaparib), MSI high: Not detected.  Will refer the patient to genetic testing   Lab review: CA 27-29: 1365 it was 1159 01/28/2023 (was 1049 and previously it was 385 on 10/16/2022)   PET CT scan 03/03/2023: Widespread hypermetabolic metastatic disease within the liver is similar to previous PET/CT, some of the lesions have mildly decreased in size and metabolic activity consistent with response to treatment.  Small lytic lesions right acromion and right inferior pubic ramus PET/CT 06/02/2023: Interval progression of hepatic metastases.  Progression of bone metastases.  Suspicion of peritoneal disease.   06/13/2023: Guardant360: ATM, T p53, FGF R1 amplification, TMB 4.73 mutations   Recommendation: Xeloda 1000 g p.o. twice daily 14 days on 7 days off.  Started 07/13/2023 Xeloda toxicities:  Fatigue Nausea: Taking nausea medications   CT CAP 07/16/2023: Interval decrease in the hypodensity in the liver stable bone metastases and stable peritoneal nodules CT CAP 10/20/2023: Breast mass similar, multiple liver metastases similar, scattered peritoneal nodules (some increase noted), bone lesions similar to prior.   Tumor marker: CA 27-29: Showing response. 06/03/2023: 3332 10/14/2023: 1933   Therefore I recommend continuation of current treatment with recheck with another CT scan in 2 months   Hand-Foot Syndrome Dry, cracking skin on hands, likely secondary to Xeloda. -Reduce Xeloda dose  to two pills in the morning and one pill in the evening.    Gastrointestinal Side Effects Patient reports increased gas, likely secondary to Xeloda. No diarrhea reported.  CT CAP 12/19/2023: Results pending  Return to clinic in 3 months with labs and follow-up

## 2023-12-25 ENCOUNTER — Inpatient Hospital Stay (HOSPITAL_BASED_OUTPATIENT_CLINIC_OR_DEPARTMENT_OTHER): Payer: 59 | Admitting: Hematology and Oncology

## 2023-12-25 VITALS — BP 129/64 | HR 68 | Temp 98.1°F | Resp 16 | Ht 64.0 in | Wt 113.9 lb

## 2023-12-25 DIAGNOSIS — C787 Secondary malignant neoplasm of liver and intrahepatic bile duct: Secondary | ICD-10-CM | POA: Diagnosis not present

## 2023-12-25 DIAGNOSIS — C50511 Malignant neoplasm of lower-outer quadrant of right female breast: Secondary | ICD-10-CM

## 2023-12-25 DIAGNOSIS — Z170 Estrogen receptor positive status [ER+]: Secondary | ICD-10-CM

## 2023-12-25 NOTE — Progress Notes (Signed)
Patient Care Team: Lavada Mesi, MD as PCP - General (Family Medicine) Peggyann Shoals, Leda Roys, MD as Referring Physician (Oncology) Donnelly Angelica, RN as Oncology Nurse Navigator Pershing Proud, RN as Oncology Nurse Navigator Emelia Loron, MD as Consulting Physician (General Surgery) Dorothy Puffer, MD as Consulting Physician (Radiation Oncology) Pollyann Savoy, MD as Consulting Physician (Rheumatology) Levi Aland, MD as Consulting Physician (Obstetrics and Gynecology) Laurey Morale, MD as Consulting Physician (Cardiology) Armbruster, Willaim Rayas, MD as Consulting Physician (Gastroenterology) Roberts Gaudy, MD as Referring Physician (Ophthalmology) Roberts Gaudy, MD as Referring Physician (Ophthalmology) Serena Croissant, MD as Consulting Physician (Hematology and Oncology) Anselm Lis, RPH-CPP as Pharmacist (Hematology and Oncology)  DIAGNOSIS:  Encounter Diagnoses  Name Primary?   Malignant neoplasm of lower-outer quadrant of right breast of female, estrogen receptor positive (HCC) Yes   Malignant neoplasm metastatic to liver (HCC)     SUMMARY OF ONCOLOGIC HISTORY: Oncology History  Malignant neoplasm of lower-outer quadrant of right breast of female, estrogen receptor positive (HCC)  08/04/2020 Initial Diagnosis   Right breast lower outer quadrant biopsy 08/04/2020 for a clinical T2 N0 M1, stage IV invasive ductal carcinoma, grade 3, triple positive, with an MIB-1 of 40%. abdominal MRI 08/24/2020 shows multiple liver lesions highly suspicious for liver metastases. Liver biopsy 09/20/2020 positive for carcinoma, prognostic panel estrogen and progesterone receptor positive, but HER-2 negative (0); MIB-1 of 30%   08/31/2020 - 12/14/2020 Chemotherapy   Neoadjuvant chemotherapy consisting of docetaxel, carboplatin, trastuzumab and pertuzumab every 21 days x 6    12/26/2020 Imaging   breast MRI 12/26/2020 showed right breast index lesion decreased to 0.8 cm; 2  additional masses in the right breast stable abdominal MRI 01/29/2021 shows no active disease in the liver    Chemotherapy   Herceptin Maintenance initially q 3 weeks, now q 4 weeks   01/29/2021 -  Anti-estrogen oral therapy   anastrozole and palbociclib    01/29/2023 Genetic Testing   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  Report date is 01/29/2023.   The Multi-Cancer + RNA Panel offered by [facility] includes sequencing and/or deletion/duplication analysis of the following 70 genes:  AIP*, ALK, APC*, ATM*, AXIN2*, BAP1*, BARD1*, BLM*, BMPR1A*, BRCA1*, BRCA2*, BRIP1*, CDC73*, CDH1*, CDK4, CDKN1B*, CDKN2A, CHEK2*, CTNNA1*, DICER1*, EPCAM (del/dup only), EGFR, FH*, FLCN*, GREM1 (promoter dup only), HOXB13, KIT, LZTR1, MAX*, MBD4, MEN1*, MET, MITF, MLH1*, MSH2*, MSH3*, MSH6*, MUTYH*, NF1*, NF2*, NTHL1*, PALB2*, PDGFRA, PMS2*, POLD1*, POLE*, POT1*, PRKAR1A*, PTCH1*, PTEN*, RAD51C*, RAD51D*, RB1*, RET, SDHA* (sequencing only), SDHAF2*, SDHB*, SDHC*, SDHD*, SMAD4*, SMARCA4*, SMARCB1*, SMARCE1*, STK11*, SUFU*, TMEM127*, TP53*, TSC1*, TSC2*, VHL*. RNA analysis is performed for * genes.   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  Report date is 01/29/2023.   The Multi-Cancer + RNA Panel offered by [facility] includes sequencing and/or deletion/duplication analysis of the following 70 genes:  AIP*, ALK, APC*, ATM*, AXIN2*, BAP1*, BARD1*, BLM*, BMPR1A*, BRCA1*, BRCA2*, BRIP1*, CDC73*, CDH1*, CDK4, CDKN1B*, CDKN2A, CHEK2*, CTNNA1*, DICER1*, EPCAM (del/dup only), EGFR, FH*, FLCN*, GREM1 (promoter dup only), HOXB13, KIT, LZTR1, MAX*, MBD4, MEN1*, MET, MITF, MLH1*, MSH2*, MSH3*, MSH6*, MUTYH*, NF1*, NF2*, NTHL1*, PALB2*, PDGFRA, PMS2*, POLD1*,  POLE*, POT1*, PRKAR1A*, PTCH1*, PTEN*, RAD51C*, RAD51D*, RB1*, RET, SDHA* (sequencing only), SDHAF2*, SDHB*,  SDHC*, SDHD*, SMAD4*, SMARCA4*, SMARCB1*, SMARCE1*, STK11*, SUFU*, TMEM127*, TP53*, TSC1*, TSC2*, VHL*. RNA analysis is performed for * genes.     CHIEF COMPLIANT: F/U met breast cancer  HISTORY OF PRESENT ILLNESS: History of Present Illness   Erica Wood is a 58 year old female with metastatic cancer who presents for follow-up and review of recent scan results.  She has metastatic cancer with involvement of the liver, peritoneum, and bones. Recent imaging shows a reduction in the size of liver lesions, with the main lesion decreasing from 3.4 cm to 2.3 cm. Peritoneal nodules are present, with one nodule increasing slightly from 1.9 cm to 2.5 cm, while others remain stable. There are no new bone lesions, and tumor markers have decreased from 3000 to 1600, indicating a positive response to current treatment. She is currently on oral chemotherapy, which is well-tolerated.  She experiences visual disturbances described as 'blurry vision' and 'things that just keep moving' in her eye, resembling a 'gear'. These episodes occur once a week or every other week, causing temporary vision loss in one eye for about a minute or two. She has not been diagnosed with glaucoma or other eye conditions. Her partner mentions similar symptoms, previously diagnosed as optical migraines.  She uses diclofenac sodium cream for foot pain but finds it makes her feet feel 'sweaty and not very comfortable'. She also uses Eucerin for her feet.         ALLERGIES:  is allergic to beef-derived drug products, latex, sulfa antibiotics, and tape.  MEDICATIONS:  Current Outpatient Medications  Medication Sig Dispense Refill   acetaminophen (TYLENOL) 500 MG tablet Take 1,000 mg by mouth every 6 (six) hours as needed for moderate pain or headache.     amphetamine-dextroamphetamine (ADDERALL XR) 20 MG 24 hr  capsule Take 1 capsule (20 mg total) by mouth daily. 30 capsule 0   Ascorbic Acid (VITAMIN C PO) Take 1,000 mg by mouth daily.     ascorbic acid (VITAMIN C) 500 MG tablet Take by mouth. (Patient not taking: Reported on 10/30/2023)     capecitabine (XELODA) 500 MG tablet TAKE 2 TABLETS BY MOUTH TWICE  DAILY AFTER A MEAL FOR 14 DAYS  ON THEN 7 DAYS OFF EVERY 21 DAYS 56 tablet 6   Cholecalciferol (VITAMIN D3) 75 MCG (3000 UT) TABS Take 3,000 Units by mouth daily.     Clindamycin-Benzoyl Per, Refr, gel Apply topically.     clobetasol cream (TEMOVATE) 0.05 % Apply 1 Application topically 2 (two) times daily. Apply to affected area. 30 g 0   diclofenac Sodium (VOLTAREN) 1 % GEL Research Patient: Apply 0.5 grams (1 fingertip) to each hand and each foot twice daily for up to 12 weeks 400 g 0   estradiol (ESTRACE) 0.1 MG/GM vaginal cream INSERT 1 APPLICATORFUL VAGINALLY 3 TIMES A WEEK 42.5 g 12   famotidine (PEPCID) 20 MG tablet Take 20 mg by mouth at bedtime.     hydroxychloroquine (PLAQUENIL) 200 MG tablet TAKE 1 TABLET(200 MG) BY MOUTH DAILY 90 tablet 0   letrozole (FEMARA) 2.5 MG tablet TAKE 1 TABLET(2.5 MG) BY MOUTH DAILY 90 tablet 3   lidocaine-prilocaine (EMLA) cream Apply topically.     nystatin-triamcinolone ointment (MYCOLOG) Apply topically. (Patient not taking: Reported on 10/30/2023)     ondansetron (ZOFRAN) 8 MG tablet Take 1 tablet (8 mg total) by mouth every 8 (eight) hours as needed for nausea or vomiting. 30 tablet 6   spironolactone (ALDACTONE) 25 MG tablet Take 25  mg by mouth daily as needed (swelling).     traMADol (ULTRAM) 50 MG tablet Take 1-2 tablets (50-100 mg total) by mouth every 6 (six) hours as needed. 60 tablet 0   tretinoin (RETIN-A) 0.1 % cream Apply 1 application  topically at bedtime as needed (acne).     triamterene-hydrochlorothiazide (DYAZIDE) 37.5-25 MG capsule TAKE 1 CAPSULE BY MOUTH DAILY 90 capsule 1   warfarin (COUMADIN) 5 MG tablet TAKE 1 TABLET(5 MG) BY MOUTH  DAILY 30 tablet PRN   No current facility-administered medications for this visit.    PHYSICAL EXAMINATION: ECOG PERFORMANCE STATUS: 1 - Symptomatic but completely ambulatory  Vitals:   12/25/23 0913  BP: 129/64  Pulse: 68  Resp: 16  Temp: 98.1 F (36.7 C)  SpO2: 100%   Filed Weights   12/25/23 0913  Weight: 113 lb 14.4 oz (51.7 kg)    LABORATORY DATA:  I have reviewed the data as listed    Latest Ref Rng & Units 12/19/2023   10:19 AM 10/14/2023   11:25 AM 08/14/2023    1:46 PM  CMP  Glucose 70 - 99 mg/dL 83  95  85   BUN 6 - 20 mg/dL 14  16  11    Creatinine 0.44 - 1.00 mg/dL 6.96  2.95  2.84   Sodium 135 - 145 mmol/L 137  137  137   Potassium 3.5 - 5.1 mmol/L 4.1  4.1  4.1   Chloride 98 - 111 mmol/L 104  102  105   CO2 22 - 32 mmol/L 30  31  28    Calcium 8.9 - 10.3 mg/dL 8.9  8.9  8.9   Total Protein 6.5 - 8.1 g/dL 6.8  6.4  6.6   Total Bilirubin 0.0 - 1.2 mg/dL 0.6  0.8  0.8   Alkaline Phos 38 - 126 U/L 72  71  77   AST 15 - 41 U/L 31  32  33   ALT 0 - 44 U/L 24  29  28      Lab Results  Component Value Date   WBC 4.8 12/19/2023   HGB 13.5 12/19/2023   HCT 40.1 12/19/2023   MCV 104.2 (H) 12/19/2023   PLT 120 (L) 12/19/2023   NEUTROABS 2.7 12/19/2023    ASSESSMENT & PLAN:  Malignant neoplasm of lower-outer quadrant of right breast of female, estrogen receptor positive (HCC) 08/04/2020: Right breast biopsy T2 N0 M1 stage IV IDC grade 3, triple positive with a Ki-67 of 40%, multiple liver lesions biopsy was positive for breast cancer ER/PR positive HER2 negative with a Ki-67 30% 08/31/2020-12/14/2020: TCHP x6 01/29/2021: Letrozole and palbociclib   11/29/2022-06/05/2023: Olaparib   10/24/2022: CT CAP: Multiple new hepatic lesions measuring up to 1.5 cm consistent with metastatic disease, stable 5 mm nodule left costophrenic sulcus 12/05/2021: Guardant360: ATM gene mutation (possible treatment option olaparib), MSI high: Not detected.  Will refer the patient to  genetic testing   Lab review: CA 27-29: 1365 it was 1159 01/28/2023 (was 1049 and previously it was 385 on 10/16/2022)   PET CT scan 03/03/2023: Widespread hypermetabolic metastatic disease within the liver is similar to previous PET/CT, some of the lesions have mildly decreased in size and metabolic activity consistent with response to treatment.  Small lytic lesions right acromion and right inferior pubic ramus PET/CT 06/02/2023: Interval progression of hepatic metastases.  Progression of bone metastases.  Suspicion of peritoneal disease.   06/13/2023: Guardant360: ATM, T p53, FGF R1 amplification, TMB 4.73 mutations  Recommendation: Xeloda 1000 g p.o. twice daily 14 days on 7 days off.  Started 07/13/2023 Xeloda toxicities:  Fatigue Nausea: Taking nausea medications   CT CAP 07/16/2023: Interval decrease in the hypodensity in the liver stable bone metastases and stable peritoneal nodules CT CAP 10/20/2023: Breast mass similar, multiple liver metastases similar, scattered peritoneal nodules (some increase noted), bone lesions similar to prior.   Tumor marker: CA 27-29: Showing response. 06/03/2023: 3332 10/14/2023: 1610 12/19/2023: 1650   Therefore I recommend continuation of current treatment with recheck with another CT scan in 2 months   Hand-Foot Syndrome Dry, cracking skin on hands, likely secondary to Xeloda. -Reduce Xeloda dose to two pills in the morning and one pill in the evening.    Gastrointestinal Side Effects Patient reports increased gas, likely secondary to Xeloda. No diarrhea reported.  CT CAP 12/19/2023: Mixed response to therapy.  Hepatic metastatic disease appears improved.  Peritoneal nodularity appears to be the same although some of the lesions may be slightly larger.  Stable bone metastases.  Unchanged right breast mass.  Return to clinic in 3 months with labs and follow-up ------------------------------------- Assessment and Plan    Metastatic Cancer Recent scans  show improvement with a reduction in liver lesions (main lesion reduced from 3.4 cm to 2.3 cm) and stable bone lesions. Peritoneal nodules are slightly concerning, with one nodule increasing from 1.9 cm to 2.5 cm. Tumor markers have decreased from 3000 to 1600, indicating a positive response to the current medication. Surgical removal of peritoneal nodules is not feasible due to the widespread nature of the disease. Explained that the average duration of response to the current medication is one year, with individual variations. - Continue current medication - Order PET scan in three months - Schedule follow-up in six weeks for labs and port flush - Schedule follow-up in three months for labs, scan, and consultation  Ocular Symptoms Reports intermittent episodes of blurred vision and visual disturbances resembling gears. These symptoms are not commonly associated with her current medication. Differential diagnosis includes optical migraines. Discussed referral to a specialist for further evaluation. - Refer to Dr. Lucia Gaskins, a specialist in migraines, for further evaluation and management  Foot Pain Reports discomfort with the use of diclofenac sodium cream for foot pain, describing a sensation of sweaty and uncomfortable feet. The cream does not appear to be effective. Discussed alternative topical treatments. - Discontinue diclofenac sodium cream - Recommend Eucerin or O'Keeffe's foot cream  General Health Maintenance Discussed the potential benefits of climate and environment on mood and stress levels, which can indirectly impact overall health. - Encourage activities that improve mood and reduce stress, such as spending time outdoors and traveling to warmer climates  Follow-up - Schedule follow-up in six weeks for labs and port flush - Schedule follow-up in three months for labs, scan, and consultation - Ensure primary care physician provides a referral to Dr. Shirlee Latch for cardiology follow-up.           Orders Placed This Encounter  Procedures   NM PET Image Restag (PS) Skull Base To Thigh    Standing Status:   Future    Expected Date:   03/23/2024    Expiration Date:   12/24/2024    If indicated for the ordered procedure, I authorize the administration of a radiopharmaceutical per Radiology protocol:   Yes    Is the patient pregnant?:   No    Preferred imaging location?:   [facility]    Release to patient:  Immediate   CBC with Differential (Cancer Center Only)    Standing Status:   Standing    Number of Occurrences:   20    Expiration Date:   12/24/2024   CMP (Cancer Center only)    Standing Status:   Standing    Number of Occurrences:   20    Expiration Date:   12/24/2024   CA 27.29    Standing Status:   Standing    Number of Occurrences:   20    Expiration Date:   12/24/2024   Ambulatory referral to Neurology    Referral Priority:   Routine    Referral Type:   Consultation    Referral Reason:   Specialty Services Required    Referred to Provider:   Anson Fret, MD    Requested Specialty:   Neurology    Number of Visits Requested:   1   The patient has a good understanding of the overall plan. she agrees with it. she will call with any problems that may develop before the next visit here. Total time spent: 30 mins including face to face time and time spent for planning, charting and co-ordination of care   Tamsen Meek, MD 12/25/23

## 2023-12-26 ENCOUNTER — Ambulatory Visit: Payer: 59 | Admitting: Hematology and Oncology

## 2023-12-31 ENCOUNTER — Encounter: Payer: Self-pay | Admitting: Genetic Counselor

## 2024-01-08 ENCOUNTER — Other Ambulatory Visit: Payer: Self-pay | Admitting: *Deleted

## 2024-01-08 MED ORDER — AMPHETAMINE-DEXTROAMPHET ER 20 MG PO CP24: 20.0000 mg | ORAL_CAPSULE | Freq: Every day | ORAL | 0 refills | Status: DC

## 2024-01-08 NOTE — Telephone Encounter (Signed)
 Patient contacted the office and requests a refill of Adderall be sent to [facility] on [address].    Last Fill: 12/11/2023   Next Visit: 04/08/2024   Last Visit: 10/30/2023   Dx: not mentioned   Current Dose per office note on 10/30/2023: not mentioned   Okay to refill Adderall?

## 2024-01-23 ENCOUNTER — Telehealth: Payer: Self-pay

## 2024-01-23 NOTE — Telephone Encounter (Signed)
 Notified the pt of her completed form for FMLA/ Accomodation. Pt had questions and concerns about her forms. I let her know that I will consult with a Jeri Banker) before faxing the forms over. Pt verbalized understanding.

## 2024-02-05 ENCOUNTER — Inpatient Hospital Stay: Payer: 59 | Attending: Hematology and Oncology | Admitting: Hematology and Oncology

## 2024-02-05 ENCOUNTER — Inpatient Hospital Stay: Payer: 59

## 2024-02-05 VITALS — BP 133/71 | HR 67 | Temp 98.3°F | Resp 17 | Ht 64.0 in | Wt 115.4 lb

## 2024-02-05 DIAGNOSIS — C7951 Secondary malignant neoplasm of bone: Secondary | ICD-10-CM | POA: Insufficient documentation

## 2024-02-05 DIAGNOSIS — Z1732 Human epidermal growth factor receptor 2 negative status: Secondary | ICD-10-CM | POA: Insufficient documentation

## 2024-02-05 DIAGNOSIS — Z79811 Long term (current) use of aromatase inhibitors: Secondary | ICD-10-CM | POA: Diagnosis not present

## 2024-02-05 DIAGNOSIS — C787 Secondary malignant neoplasm of liver and intrahepatic bile duct: Secondary | ICD-10-CM

## 2024-02-05 DIAGNOSIS — Z79899 Other long term (current) drug therapy: Secondary | ICD-10-CM | POA: Insufficient documentation

## 2024-02-05 DIAGNOSIS — Z170 Estrogen receptor positive status [ER+]: Secondary | ICD-10-CM

## 2024-02-05 DIAGNOSIS — C50511 Malignant neoplasm of lower-outer quadrant of right female breast: Secondary | ICD-10-CM | POA: Diagnosis present

## 2024-02-05 DIAGNOSIS — Z1721 Progesterone receptor positive status: Secondary | ICD-10-CM | POA: Insufficient documentation

## 2024-02-05 DIAGNOSIS — Z95828 Presence of other vascular implants and grafts: Secondary | ICD-10-CM

## 2024-02-05 LAB — CBC WITH DIFFERENTIAL (CANCER CENTER ONLY)
Abs Immature Granulocytes: 0.01 10*3/uL (ref 0.00–0.07)
Basophils Absolute: 0.1 10*3/uL (ref 0.0–0.1)
Basophils Relative: 1 %
Eosinophils Absolute: 0 10*3/uL (ref 0.0–0.5)
Eosinophils Relative: 1 %
HCT: 37.9 % (ref 36.0–46.0)
Hemoglobin: 12.9 g/dL (ref 12.0–15.0)
Immature Granulocytes: 0 %
Lymphocytes Relative: 32 %
Lymphs Abs: 1.6 10*3/uL (ref 0.7–4.0)
MCH: 34.1 pg — ABNORMAL HIGH (ref 26.0–34.0)
MCHC: 34 g/dL (ref 30.0–36.0)
MCV: 100.3 fL — ABNORMAL HIGH (ref 80.0–100.0)
Monocytes Absolute: 0.5 10*3/uL (ref 0.1–1.0)
Monocytes Relative: 10 %
Neutro Abs: 2.7 10*3/uL (ref 1.7–7.7)
Neutrophils Relative %: 56 %
Platelet Count: 143 10*3/uL — ABNORMAL LOW (ref 150–400)
RBC: 3.78 MIL/uL — ABNORMAL LOW (ref 3.87–5.11)
RDW: 13.1 % (ref 11.5–15.5)
WBC Count: 4.9 10*3/uL (ref 4.0–10.5)
nRBC: 0 % (ref 0.0–0.2)

## 2024-02-05 LAB — CMP (CANCER CENTER ONLY)
ALT: 21 U/L (ref 0–44)
AST: 33 U/L (ref 15–41)
Albumin: 4.1 g/dL (ref 3.5–5.0)
Alkaline Phosphatase: 81 U/L (ref 38–126)
Anion gap: 3 — ABNORMAL LOW (ref 5–15)
BUN: 12 mg/dL (ref 6–20)
CO2: 30 mmol/L (ref 22–32)
Calcium: 8.8 mg/dL — ABNORMAL LOW (ref 8.9–10.3)
Chloride: 104 mmol/L (ref 98–111)
Creatinine: 0.84 mg/dL (ref 0.44–1.00)
GFR, Estimated: >60 mL/min (ref >60)
Glucose, Bld: 102 mg/dL — ABNORMAL HIGH (ref 70–99)
Potassium: 4.1 mmol/L (ref 3.5–5.1)
Sodium: 137 mmol/L (ref 135–145)
Total Bilirubin: 0.5 mg/dL (ref 0.0–1.2)
Total Protein: 6.6 g/dL (ref 6.5–8.1)

## 2024-02-05 MED ORDER — SODIUM CHLORIDE 0.9% FLUSH
10.0000 mL | Freq: Once | INTRAVENOUS | Status: AC
  Administered 2024-02-05: 10 mL

## 2024-02-05 MED ORDER — HEPARIN SOD (PORK) LOCK FLUSH 100 UNIT/ML IV SOLN
500.0000 [IU] | Freq: Once | INTRAVENOUS | Status: AC
  Administered 2024-02-05: 500 [IU]

## 2024-02-05 NOTE — Assessment & Plan Note (Signed)
 08/04/2020: Right breast biopsy T2 N0 M1 stage IV IDC grade 3, triple positive with a Ki-67 of 40%, multiple liver lesions biopsy was positive for breast cancer ER/PR positive HER2 negative with a Ki-67 30% 08/31/2020-12/14/2020: TCHP x6 01/29/2021: Letrozole and palbociclib   11/29/2022-06/05/2023: Olaparib   10/24/2022: CT CAP: Multiple new hepatic lesions measuring up to 1.5 cm consistent with metastatic disease, stable 5 mm nodule left costophrenic sulcus 12/05/2021: Guardant360: ATM gene mutation (possible treatment option olaparib), MSI high: Not detected.  Will refer the patient to genetic testing   Lab review: CA 27-29: 1365 it was 1159 01/28/2023 (was 1049 and previously it was 385 on 10/16/2022)   PET CT scan 03/03/2023: Widespread hypermetabolic metastatic disease within the liver is similar to previous PET/CT, some of the lesions have mildly decreased in size and metabolic activity consistent with response to treatment.  Small lytic lesions right acromion and right inferior pubic ramus PET/CT 06/02/2023: Interval progression of hepatic metastases.  Progression of bone metastases.  Suspicion of peritoneal disease.   06/13/2023: Guardant360: ATM, T p53, FGF R1 amplification, TMB 4.73 mutations   Recommendation: Xeloda 1000 g p.o. twice daily 14 days on 7 days off.  Started 07/13/2023 Xeloda toxicities:  Fatigue Nausea: Taking nausea medications   CT CAP 07/16/2023: Interval decrease in the hypodensity in the liver stable bone metastases and stable peritoneal nodules CT CAP 10/20/2023: Breast mass similar, multiple liver metastases similar, scattered peritoneal nodules (some increase noted), bone lesions similar to prior.   Tumor marker: CA 27-29: Showing response. 06/03/2023: 3332 10/14/2023: 1933 12/19/2023: 1650    Hand-Foot Syndrome Dry, cracking skin on hands, likely secondary to Xeloda. -Reduce Xeloda dose to two pills in the morning and one pill in the evening.    Gastrointestinal Side  Effects Patient reports increased gas, likely secondary to Xeloda. No diarrhea reported.   CT CAP 12/19/2023: Mixed response to therapy.  Hepatic metastatic disease appears improved.  Peritoneal nodularity appears to be the same although some of the lesions may be slightly larger.  Stable bone metastases.  Unchanged right breast mass.

## 2024-02-05 NOTE — Progress Notes (Signed)
 Patient Care Team: Lavada Mesi, MD as PCP - General (Family Medicine) Peggyann Shoals, Leda Roys, MD as Referring Physician (Oncology) Donnelly Angelica, RN as Oncology Nurse Navigator Pershing Proud, RN as Oncology Nurse Navigator Emelia Loron, MD as Consulting Physician (General Surgery) Dorothy Puffer, MD as Consulting Physician (Radiation Oncology) Pollyann Savoy, MD as Consulting Physician (Rheumatology) Levi Aland, MD as Consulting Physician (Obstetrics and Gynecology) Laurey Morale, MD as Consulting Physician (Cardiology) Armbruster, Willaim Rayas, MD as Consulting Physician (Gastroenterology) Roberts Gaudy, MD as Referring Physician (Ophthalmology) Roberts Gaudy, MD as Referring Physician (Ophthalmology) Serena Croissant, MD as Consulting Physician (Hematology and Oncology) Anselm Lis, RPH-CPP as Pharmacist (Hematology and Oncology)  DIAGNOSIS:  Encounter Diagnosis  Name Primary?   Malignant neoplasm of lower-outer quadrant of right breast of female, estrogen receptor positive (HCC) Yes    SUMMARY OF ONCOLOGIC HISTORY: Oncology History  Malignant neoplasm of lower-outer quadrant of right breast of female, estrogen receptor positive (HCC)  08/04/2020 Initial Diagnosis   Right breast lower outer quadrant biopsy 08/04/2020 for a clinical T2 N0 M1, stage IV invasive ductal carcinoma, grade 3, triple positive, with an MIB-1 of 40%. abdominal MRI 08/24/2020 shows multiple liver lesions highly suspicious for liver metastases. Liver biopsy 09/20/2020 positive for carcinoma, prognostic panel estrogen and progesterone receptor positive, but HER-2 negative (0); MIB-1 of 30%   08/31/2020 - 12/14/2020 Chemotherapy   Neoadjuvant chemotherapy consisting of docetaxel, carboplatin, trastuzumab and pertuzumab every 21 days x 6    12/26/2020 Imaging   breast MRI 12/26/2020 showed right breast index lesion decreased to 0.8 cm; 2 additional masses in the right breast  stable abdominal MRI 01/29/2021 shows no active disease in the liver    Chemotherapy   Herceptin Maintenance initially q 3 weeks, now q 4 weeks   01/29/2021 -  Anti-estrogen oral therapy   anastrozole and palbociclib    01/29/2023 Genetic Testing   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  Report date is 01/29/2023.   The Multi-Cancer + RNA Panel offered by [facility] includes sequencing and/or deletion/duplication analysis of the following 70 genes:  AIP*, ALK, APC*, ATM*, AXIN2*, BAP1*, BARD1*, BLM*, BMPR1A*, BRCA1*, BRCA2*, BRIP1*, CDC73*, CDH1*, CDK4, CDKN1B*, CDKN2A, CHEK2*, CTNNA1*, DICER1*, EPCAM (del/dup only), EGFR, FH*, FLCN*, GREM1 (promoter dup only), HOXB13, KIT, LZTR1, MAX*, MBD4, MEN1*, MET, MITF, MLH1*, MSH2*, MSH3*, MSH6*, MUTYH*, NF1*, NF2*, NTHL1*, PALB2*, PDGFRA, PMS2*, POLD1*, POLE*, POT1*, PRKAR1A*, PTCH1*, PTEN*, RAD51C*, RAD51D*, RB1*, RET, SDHA* (sequencing only), SDHAF2*, SDHB*, SDHC*, SDHD*, SMAD4*, SMARCA4*, SMARCB1*, SMARCE1*, STK11*, SUFU*, TMEM127*, TP53*, TSC1*, TSC2*, VHL*. RNA analysis is performed for * genes.   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  Report date is 01/29/2023.   The Multi-Cancer + RNA Panel offered by [facility] includes sequencing and/or deletion/duplication analysis of the following 70 genes:  AIP*, ALK, APC*, ATM*, AXIN2*, BAP1*, BARD1*, BLM*, BMPR1A*, BRCA1*, BRCA2*, BRIP1*, CDC73*, CDH1*, CDK4, CDKN1B*, CDKN2A, CHEK2*, CTNNA1*, DICER1*, EPCAM (del/dup only), EGFR, FH*, FLCN*, GREM1 (promoter dup only), HOXB13, KIT, LZTR1, MAX*, MBD4, MEN1*, MET, MITF, MLH1*, MSH2*, MSH3*, MSH6*, MUTYH*, NF1*, NF2*, NTHL1*, PALB2*, PDGFRA, PMS2*, POLD1*, POLE*, POT1*, PRKAR1A*, PTCH1*, PTEN*,  RAD51C*, RAD51D*, RB1*, RET, SDHA* (sequencing only), SDHAF2*, SDHB*, SDHC*, SDHD*, SMAD4*, SMARCA4*, SMARCB1*, SMARCE1*, STK11*, SUFU*, HYQM578*,  TP53*, TSC1*, TSC2*, VHL*. RNA analysis is performed for * genes.     CHIEF COMPLIANT: Follow-up on Xeloda  HISTORY OF PRESENT ILLNESS:   History of Present Illness Erica Wood, a patient with a history of cancer, presents with recent feelings of agitation and irritability. She is currently on three pills of Xeloda, which she tolerates well with no reported diarrhea. She has been moisturizing regularly. She has noticed an increase in her blood pressure, although it is still within acceptable ranges. She denies any new pain. She has been experiencing some irritability, which may be due to estrogen suppression from her medication. She is due for a PET scan in May and has not received her Xgeva bone injection since August of the previous year. She expresses a dislike for needles and inquires about natural alternatives.     ALLERGIES:  is allergic to beef-derived drug products, latex, sulfa antibiotics, and tape.  MEDICATIONS:  Current Outpatient Medications  Medication Sig Dispense Refill   acetaminophen (TYLENOL) 500 MG tablet Take 1,000 mg by mouth every 6 (six) hours as needed for moderate pain or headache.     amphetamine-dextroamphetamine (ADDERALL XR) 20 MG 24 hr capsule Take 1 capsule (20 mg total) by mouth daily. 30 capsule 0   Ascorbic Acid (VITAMIN C PO) Take 1,000 mg by mouth daily.     ascorbic acid (VITAMIN C) 500 MG tablet Take by mouth. (Patient not taking: Reported on 10/30/2023)     capecitabine (XELODA) 500 MG tablet TAKE 2 TABLETS BY MOUTH TWICE  DAILY AFTER A MEAL FOR 14 DAYS  ON THEN 7 DAYS OFF EVERY 21 DAYS 56 tablet 6   Cholecalciferol (VITAMIN D3) 75 MCG (3000 UT) TABS Take 3,000 Units by mouth daily.     Clindamycin-Benzoyl Per, Refr, gel Apply topically.     clobetasol cream (TEMOVATE) 0.05 % Apply 1 Application topically 2 (two)  times daily. Apply to affected area. 30 g 0   diclofenac Sodium (VOLTAREN) 1 % GEL Research Patient: Apply 0.5 grams (1 fingertip) to each hand and each foot twice daily for up to 12 weeks 400 g 0   estradiol (ESTRACE) 0.1 MG/GM vaginal cream INSERT 1 APPLICATORFUL VAGINALLY 3 TIMES A WEEK 42.5 g 12   famotidine (PEPCID) 20 MG tablet Take 20 mg by mouth at bedtime.     hydroxychloroquine (PLAQUENIL) 200 MG tablet TAKE 1 TABLET(200 MG) BY MOUTH DAILY 90 tablet 0   letrozole (FEMARA) 2.5 MG tablet TAKE 1 TABLET(2.5 MG) BY MOUTH DAILY 90 tablet 3   lidocaine-prilocaine (EMLA) cream Apply topically.     nystatin-triamcinolone ointment (MYCOLOG) Apply topically. (Patient not taking: Reported on 10/30/2023)     ondansetron (ZOFRAN) 8 MG tablet Take 1 tablet (8 mg total) by mouth every 8 (eight) hours as needed for nausea or vomiting. 30 tablet 6   spironolactone (ALDACTONE) 25 MG tablet Take 25 mg by mouth daily as needed (swelling).     traMADol (ULTRAM) 50 MG tablet Take 1-2 tablets (50-100 mg total) by mouth every 6 (six) hours as needed. 60 tablet 0   tretinoin (RETIN-A) 0.1 % cream Apply 1 application  topically at bedtime as needed (acne).     triamterene-hydrochlorothiazide (DYAZIDE) 37.5-25 MG capsule TAKE 1 CAPSULE BY MOUTH DAILY 90 capsule 1   warfarin (COUMADIN) 5 MG tablet TAKE 1 TABLET(5 MG) BY MOUTH DAILY 30 tablet PRN   No current facility-administered medications for this visit.    PHYSICAL EXAMINATION: ECOG PERFORMANCE STATUS: 1 - Symptomatic but  completely ambulatory  Vitals:   02/05/24 1129  BP: 133/71  Pulse: 67  Resp: 17  Temp: 98.3 F (36.8 C)  SpO2: 100%   Filed Weights   02/05/24 1129  Weight: 115 lb 6.4 oz (52.3 kg)      LABORATORY DATA:  I have reviewed the data as listed    Latest Ref Rng & Units 12/19/2023   10:19 AM 10/14/2023   11:25 AM 08/14/2023    1:46 PM  CMP  Glucose 70 - 99 mg/dL 83  95  85   BUN 6 - 20 mg/dL 14  16  11    Creatinine 0.44 - 1.00  mg/dL 1.61  0.96  0.45   Sodium 135 - 145 mmol/L 137  137  137   Potassium 3.5 - 5.1 mmol/L 4.1  4.1  4.1   Chloride 98 - 111 mmol/L 104  102  105   CO2 22 - 32 mmol/L 30  31  28    Calcium 8.9 - 10.3 mg/dL 8.9  8.9  8.9   Total Protein 6.5 - 8.1 g/dL 6.8  6.4  6.6   Total Bilirubin 0.0 - 1.2 mg/dL 0.6  0.8  0.8   Alkaline Phos 38 - 126 U/L 72  71  77   AST 15 - 41 U/L 31  32  33   ALT 0 - 44 U/L 24  29  28      Lab Results  Component Value Date   WBC 4.9 02/05/2024   HGB 12.9 02/05/2024   HCT 37.9 02/05/2024   MCV 100.3 (H) 02/05/2024   PLT 143 (L) 02/05/2024   NEUTROABS 2.7 02/05/2024    ASSESSMENT & PLAN:  Malignant neoplasm of lower-outer quadrant of right breast of female, estrogen receptor positive (HCC) 08/04/2020: Right breast biopsy T2 N0 M1 stage IV IDC grade 3, triple positive with a Ki-67 of 40%, multiple liver lesions biopsy was positive for breast cancer ER/PR positive HER2 negative with a Ki-67 30% 08/31/2020-12/14/2020: TCHP x6 01/29/2021: Letrozole and palbociclib   11/29/2022-06/05/2023: Olaparib   10/24/2022: CT CAP: Multiple new hepatic lesions measuring up to 1.5 cm consistent with metastatic disease, stable 5 mm nodule left costophrenic sulcus 12/05/2021: Guardant360: ATM gene mutation (possible treatment option olaparib), MSI high: Not detected.  Will refer the patient to genetic testing   Lab review: CA 27-29: 1365 it was 1159 01/28/2023 (was 1049 and previously it was 385 on 10/16/2022)   PET CT scan 03/03/2023: Widespread hypermetabolic metastatic disease within the liver is similar to previous PET/CT, some of the lesions have mildly decreased in size and metabolic activity consistent with response to treatment.  Small lytic lesions right acromion and right inferior pubic ramus PET/CT 06/02/2023: Interval progression of hepatic metastases.  Progression of bone metastases.  Suspicion of peritoneal disease.   06/13/2023: Guardant360: ATM, T p53, FGF R1 amplification,  TMB 4.73 mutations   Recommendation: Xeloda 1000 g p.o. twice daily 14 days on 7 days off.  Started 07/13/2023 Xeloda toxicities:  Fatigue Nausea: Taking nausea medications   CT CAP 07/16/2023: Interval decrease in the hypodensity in the liver stable bone metastases and stable peritoneal nodules CT CAP 10/20/2023: Breast mass similar, multiple liver metastases similar, scattered peritoneal nodules (some increase noted), bone lesions similar to prior.   Tumor marker: CA 27-29: Showing response. 06/03/2023: 3332 10/14/2023: 1933 12/19/2023: 1650    Hand-Foot Syndrome Dry, cracking skin on hands, likely secondary to Xeloda. -Reduced Xeloda dose to two pills in the morning and  one pill in the evening.    Gastrointestinal Side Effects Patient reports increased gas, likely secondary to Xeloda. No diarrhea reported.   CT CAP 12/19/2023: Mixed response to therapy.  Hepatic metastatic disease appears improved.  Peritoneal nodularity appears to be the same although some of the lesions may be slightly larger.  Stable bone metastases.  Unchanged right breast mass.   Bone metastases: Last Xgeva injection was August 2024.  Patient does not want to continue Xgeva injections.  I offered her Zometa infusions.  She will think about it and let us know. Follow-up after PET CT scan in May 2025. ------------------------------------- Assessment and Plan Assessment & Plan Metastatic breast cancer Metastatic breast cancer with liver, peritoneum, and bone involvement. On Capecitabine with stable condition. Normal blood counts. Favorable prognosis with excellent performance status and limited spread. - Continue Capecitabine regimen. - Schedule PET scan for May 13. - Discontinue Letrozole. - Monitor liver function and tumor markers.  Bone metastases Bone metastases with last Xgeva injection in August 2024. Restarting Xgeva recommended for bone strength. Alternatives include Zoneta with supplementation. - Restart  Xgeva injections every three months. - Discuss alternative IV treatment with Zoneta. - Advise calcium and vitamin D supplementation.  Hypertension Elevated blood pressure readings noted, not concerning. - Continue monitoring blood pressure.  Irritability Agitation and irritability potentially related to estrogen suppression from cancer treatment.  Follow-up Follow-up planned post-PET scan to review results and treatment response. - Schedule follow-up appointment after PET scan in May.      No orders of the defined types were placed in this encounter.  The patient has a good understanding of the overall plan. she agrees with it. she will call with any problems that may develop before the next visit here. Total time spent: 30 mins including face to face time and time spent for planning, charting and co-ordination of care   Tamsen Meek, MD 02/05/24

## 2024-02-06 ENCOUNTER — Other Ambulatory Visit: Payer: Self-pay | Admitting: *Deleted

## 2024-02-06 LAB — CANCER ANTIGEN 27.29: CA 27.29: 2488 U/mL — ABNORMAL HIGH (ref 0.0–38.6)

## 2024-02-06 MED ORDER — AMPHETAMINE-DEXTROAMPHET ER 20 MG PO CP24: 20.0000 mg | ORAL_CAPSULE | Freq: Every day | ORAL | 0 refills | Status: DC

## 2024-02-06 NOTE — Telephone Encounter (Signed)
 Patient contacted the office and requests a refill of Adderall be sent to [facility] on [address].    Last Fill: 01/08/2024   Next Visit: 04/08/2024   Last Visit: 10/30/2023   Dx: not mentioned   Current Dose per office note on 10/30/2023: not mentioned   Okay to refill Adderall?

## 2024-02-12 ENCOUNTER — Other Ambulatory Visit (HOSPITAL_COMMUNITY): Payer: Self-pay

## 2024-02-12 ENCOUNTER — Other Ambulatory Visit: Payer: Self-pay | Admitting: Hematology and Oncology

## 2024-02-12 DIAGNOSIS — C50511 Malignant neoplasm of lower-outer quadrant of right female breast: Secondary | ICD-10-CM

## 2024-02-12 MED ORDER — CAPECITABINE 500 MG PO TABS: 1000.0000 mg | ORAL_TABLET | Freq: Two times a day (BID) | ORAL | 6 refills | Status: DC

## 2024-02-12 NOTE — Progress Notes (Signed)
 With the increase in the tumor marker, we recommended increasing the dosage of Xeloda to 1000 mg p.o. twice daily 14 days on 7 days off.  She is scheduled for a PET scan on May 1.  Will follow-up on the results and discuss options.

## 2024-03-05 ENCOUNTER — Telehealth: Payer: Self-pay | Admitting: *Deleted

## 2024-03-05 NOTE — Telephone Encounter (Signed)
 error

## 2024-03-08 ENCOUNTER — Other Ambulatory Visit: Payer: Self-pay | Admitting: *Deleted

## 2024-03-08 MED ORDER — AMPHETAMINE-DEXTROAMPHET ER 20 MG PO CP24: 20.0000 mg | ORAL_CAPSULE | Freq: Every day | ORAL | 0 refills | Status: DC

## 2024-03-08 NOTE — Telephone Encounter (Signed)
 Patient contacted the office and requests a refill of Adderall be sent to [facility] on [address].    Last Fill: 02/06/2024   Next Visit: 04/08/2024   Last Visit: 10/30/2023   Dx: not mentioned   Current Dose per office note on 10/30/2023: not mentioned   Okay to refill Adderall?

## 2024-03-11 ENCOUNTER — Ambulatory Visit (HOSPITAL_COMMUNITY)
Admission: RE | Admit: 2024-03-11 | Discharge: 2024-03-11 | Disposition: A | Discharge status: Home / Self Care | Source: Ambulatory Visit | Attending: Hematology and Oncology | Admitting: Hematology and Oncology

## 2024-03-11 DIAGNOSIS — C787 Secondary malignant neoplasm of liver and intrahepatic bile duct: Secondary | ICD-10-CM | POA: Insufficient documentation

## 2024-03-11 DIAGNOSIS — C50511 Malignant neoplasm of lower-outer quadrant of right female breast: Secondary | ICD-10-CM | POA: Insufficient documentation

## 2024-03-11 DIAGNOSIS — Z170 Estrogen receptor positive status [ER+]: Secondary | ICD-10-CM | POA: Insufficient documentation

## 2024-03-11 LAB — GLUCOSE, CAPILLARY: Glucose-Capillary: 86 mg/dL (ref 70–99)

## 2024-03-11 MED ORDER — FLUDEOXYGLUCOSE F - 18 (FDG) INJECTION
5.6900 | Freq: Once | INTRAVENOUS | Status: AC
  Administered 2024-03-11: 5.69 via INTRAVENOUS

## 2024-03-18 ENCOUNTER — Other Ambulatory Visit: Payer: Self-pay | Admitting: *Deleted

## 2024-03-18 ENCOUNTER — Inpatient Hospital Stay: Attending: Hematology and Oncology | Admitting: Hematology and Oncology

## 2024-03-18 ENCOUNTER — Inpatient Hospital Stay: Admitting: Hematology and Oncology

## 2024-03-18 VITALS — BP 118/57 | HR 68 | Temp 97.7°F | Resp 18 | Ht 64.0 in | Wt 115.5 lb

## 2024-03-18 DIAGNOSIS — I351 Nonrheumatic aortic (valve) insufficiency: Secondary | ICD-10-CM | POA: Diagnosis not present

## 2024-03-18 DIAGNOSIS — C787 Secondary malignant neoplasm of liver and intrahepatic bile duct: Secondary | ICD-10-CM | POA: Insufficient documentation

## 2024-03-18 DIAGNOSIS — C50511 Malignant neoplasm of lower-outer quadrant of right female breast: Secondary | ICD-10-CM | POA: Diagnosis present

## 2024-03-18 DIAGNOSIS — Z5112 Encounter for antineoplastic immunotherapy: Secondary | ICD-10-CM | POA: Diagnosis present

## 2024-03-18 DIAGNOSIS — C7951 Secondary malignant neoplasm of bone: Secondary | ICD-10-CM | POA: Diagnosis not present

## 2024-03-18 DIAGNOSIS — Z1732 Human epidermal growth factor receptor 2 negative status: Secondary | ICD-10-CM | POA: Diagnosis not present

## 2024-03-18 DIAGNOSIS — Z170 Estrogen receptor positive status [ER+]: Secondary | ICD-10-CM | POA: Diagnosis not present

## 2024-03-18 DIAGNOSIS — Z1721 Progesterone receptor positive status: Secondary | ICD-10-CM | POA: Diagnosis not present

## 2024-03-18 DIAGNOSIS — Z5181 Encounter for therapeutic drug level monitoring: Secondary | ICD-10-CM | POA: Diagnosis not present

## 2024-03-18 DIAGNOSIS — Z79899 Other long term (current) drug therapy: Secondary | ICD-10-CM | POA: Diagnosis not present

## 2024-03-18 MED ORDER — ONDANSETRON HCL 8 MG PO TABS: 8.0000 mg | ORAL_TABLET | Freq: Three times a day (TID) | ORAL | 1 refills | Status: DC | PRN

## 2024-03-18 MED ORDER — PROCHLORPERAZINE MALEATE 10 MG PO TABS: 10.0000 mg | ORAL_TABLET | Freq: Four times a day (QID) | ORAL | 1 refills | Status: DC | PRN

## 2024-03-18 MED ORDER — LIDOCAINE-PRILOCAINE 2.5-2.5 % EX CREA: TOPICAL_CREAM | CUTANEOUS | 3 refills | Status: AC

## 2024-03-18 MED ORDER — OXYCODONE-ACETAMINOPHEN 5-325 MG PO TABS: 1.0000 | ORAL_TABLET | Freq: Three times a day (TID) | ORAL | 0 refills | Status: AC | PRN

## 2024-03-18 MED ORDER — MAGNESIUM CITRATE 100 MG PO TABS: 1.0000 | ORAL_TABLET | Freq: Every day | ORAL | 3 refills | Status: AC | PRN

## 2024-03-18 NOTE — Progress Notes (Signed)
 DISCONTINUE ON PATHWAY REGIMEN - Breast     A cycle is every 21 days:     Pertuzumab       Pertuzumab       Trastuzumab -xxxx      Trastuzumab -xxxx      Carboplatin       Docetaxel    **Always confirm dose/schedule in your pharmacy ordering system**  PRIOR TREATMENT: [id number]: Docetaxel  + Carboplatin  + Trastuzumab  IV + Pertuzumab  IV (TCHP IV) q21 Days x 6 Cycles  START ON PATHWAY REGIMEN - Breast     A cycle is every 21 days:     Fam-trastuzumab  deruxtecan-nxki   **Always confirm dose/schedule in your pharmacy ordering system**  Patient Characteristics: Distant Metastases or Locoregional Recurrent Disease - Unresected, M0 or Locally Advanced Unresectable Disease Progressing after Neoadjuvant and Local Therapies, M0, HER2 Positive, ER Positive, Chemotherapy + HER2-Targeted Therapy, Second Line Therapeutic Status: Distant Metastases HER2 Status: Positive (+) ER Status: Positive (+) PR Status: Positive (+) Line of Therapy: Second Line Intent of Therapy: Non-Curative / Palliative Intent, Discussed with Patient

## 2024-03-18 NOTE — Progress Notes (Signed)
 Patient Care Team: Rey Catholic, MD as PCP - General (Family Medicine) Jeri Mons, Nash Bade, MD as Referring Physician (Oncology) Alane Hsu, RN as Oncology Nurse Navigator Auther Bo, RN as Oncology Nurse Navigator Enid Harry, MD as Consulting Physician (General Surgery) Johna Myers, MD as Consulting Physician (Radiation Oncology) Nicholas Bari, MD as Consulting Physician (Rheumatology) Hamp Levine, MD as Consulting Physician (Obstetrics and Gynecology) Darlis Eisenmenger, MD as Consulting Physician (Cardiology) Armbruster, Lendon Queen, MD as Consulting Physician (Gastroenterology) Marvis Sluder, MD as Referring Physician (Ophthalmology) Marvis Sluder, MD as Referring Physician (Ophthalmology) Cameron Cea, MD as Consulting Physician (Hematology and Oncology) Althea Atkinson, RPH-CPP as Pharmacist (Hematology and Oncology)  DIAGNOSIS:  Encounter Diagnosis  Name Primary?   Malignant neoplasm of lower-outer quadrant of right breast of female, estrogen receptor positive (HCC) Yes    SUMMARY OF ONCOLOGIC HISTORY: Oncology History  Malignant neoplasm of lower-outer quadrant of right breast of female, estrogen receptor positive (HCC)  08/04/2020 Initial Diagnosis   Right breast lower outer quadrant biopsy 08/04/2020 for a clinical T2 N0 M1, stage IV invasive ductal carcinoma, grade 3, triple positive, with an MIB-1 of 40%. abdominal MRI 08/24/2020 shows multiple liver lesions highly suspicious for liver metastases. Liver biopsy 09/20/2020 positive for carcinoma, prognostic panel estrogen and progesterone receptor positive, but HER-2 negative (0); MIB-1 of 30%   08/31/2020 - 12/14/2020 Chemotherapy   Neoadjuvant chemotherapy consisting of docetaxel , carboplatin , trastuzumab  and pertuzumab  every 21 days x 6    12/26/2020 Imaging   breast MRI 12/26/2020 showed right breast index lesion decreased to 0.8 cm; 2 additional masses in the right breast  stable abdominal MRI 01/29/2021 shows no active disease in the liver    Chemotherapy   Herceptin  Maintenance initially q 3 weeks, now q 4 weeks   01/29/2021 -  Anti-estrogen oral therapy   anastrozole  and palbociclib     01/29/2023 Genetic Testing   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  Report date is 01/29/2023.   The Multi-Cancer + RNA Panel offered by [facility] includes sequencing and/or deletion/duplication analysis of the following 70 genes:  AIP*, ALK, APC*, ATM*, AXIN2*, BAP1*, BARD1*, BLM*, BMPR1A*, BRCA1*, BRCA2*, BRIP1*, CDC73*, CDH1*, CDK4, CDKN1B*, CDKN2A, CHEK2*, CTNNA1*, DICER1*, EPCAM (del/dup only), EGFR, FH*, FLCN*, GREM1 (promoter dup only), HOXB13, KIT, LZTR1, MAX*, MBD4, MEN1*, MET, MITF, MLH1*, MSH2*, MSH3*, MSH6*, MUTYH*, NF1*, NF2*, NTHL1*, PALB2*, PDGFRA, PMS2*, POLD1*, POLE*, POT1*, PRKAR1A*, PTCH1*, PTEN*, RAD51C*, RAD51D*, RB1*, RET, SDHA* (sequencing only), SDHAF2*, SDHB*, SDHC*, SDHD*, SMAD4*, SMARCA4*, SMARCB1*, SMARCE1*, STK11*, SUFU*, TMEM127*, TP53*, TSC1*, TSC2*, VHL*. RNA analysis is performed for * genes.   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  Report date is 01/29/2023.   The Multi-Cancer + RNA Panel offered by [facility] includes sequencing and/or deletion/duplication analysis of the following 70 genes:  AIP*, ALK, APC*, ATM*, AXIN2*, BAP1*, BARD1*, BLM*, BMPR1A*, BRCA1*, BRCA2*, BRIP1*, CDC73*, CDH1*, CDK4, CDKN1B*, CDKN2A, CHEK2*, CTNNA1*, DICER1*, EPCAM (del/dup only), EGFR, FH*, FLCN*, GREM1 (promoter dup only), HOXB13, KIT, LZTR1, MAX*, MBD4, MEN1*, MET, MITF,  MLH1*, MSH2*, MSH3*, MSH6*, MUTYH*, NF1*, NF2*, NTHL1*, PALB2*, PDGFRA, PMS2*, POLD1*, POLE*,  POT1*, PRKAR1A*, PTCH1*, PTEN*, RAD51C*, RAD51D*, RB1*, RET, SDHA* (sequencing only), SDHAF2*, SDHB*, SDHC*, SDHD*, SMAD4*, SMARCA4*, SMARCB1*, SMARCE1*, STK11*, SUFU*, TMEM127*, TP53*, TSC1*, TSC2*, VHL*. RNA analysis is performed for * genes.     CHIEF COMPLIANT: Follow-up to discuss results of scans and changing treatment plan  HISTORY OF PRESENT ILLNESS:  History of Present Illness Erica Wood is a 58 year old female with HER2 positive breast cancer who presents with worsening abdominal symptoms and nausea. She is accompanied by a family member, possibly her spouse, who actively participates in the discussion.  She experiences constant bloating and pain predominantly on one side of her abdomen, with pain severe enough to disrupt sleep and progressively worsening over the past month. Constipation occurs with bowel movements only once every five days, accompanied by nausea and a sensation of bloating and distention. Various home remedies have been ineffective.  She has HER2 positive breast cancer, initially treated with Herceptin  and Perjeta . Subsequent biopsies indicated a change to HER2 negative status, leading to a treatment change to Xeloda . Despite adjustments in dosage, symptoms have worsened, and tumor markers have increased.  She experiences significant fatigue and a lack of appetite, eating minimally due to nausea and abdominal discomfort.     ALLERGIES:  is allergic to beef-derived drug products, latex, sulfa antibiotics, and tape.  MEDICATIONS:  Current Outpatient Medications  Medication Sig Dispense Refill   Magnesium Citrate 100 MG TABS Take 1 tablet by mouth daily as needed. 30 tablet 3   oxyCODONE -acetaminophen  (PERCOCET/ROXICET) 5-325 MG tablet Take 1 tablet by mouth every 8 (eight) hours as needed for severe pain (pain score 7-10). 30 tablet 0   acetaminophen  (TYLENOL )  500 MG tablet Take 1,000 mg by mouth every 6 (six) hours as needed for moderate pain or headache.     amphetamine -dextroamphetamine (ADDERALL XR) 20 MG 24 hr capsule Take 1 capsule (20 mg total) by mouth daily. 30 capsule 0   Ascorbic Acid (VITAMIN C PO) Take 1,000 mg by mouth daily.     capecitabine  (XELODA ) 500 MG tablet Take 2 tablets (1,000 mg total) by mouth 2 (two) times daily after a meal. 14 days on 7 days off 56 tablet 6   Cholecalciferol (VITAMIN D3) 75 MCG (3000 UT) TABS Take 3,000 Units by mouth daily.     Clindamycin-Benzoyl Per, Refr, gel Apply topically.     clobetasol  cream (TEMOVATE ) 0.05 % Apply 1 Application topically 2 (two) times daily. Apply to affected area. 30 g 0   diclofenac  Sodium (VOLTAREN ) 1 % GEL Research Patient: Apply 0.5 grams (1 fingertip) to each hand and each foot twice daily for up to 12 weeks 400 g 0   estradiol  (ESTRACE ) 0.1 MG/GM vaginal cream INSERT 1 APPLICATORFUL VAGINALLY 3 TIMES A WEEK 42.5 g 12   famotidine (PEPCID) 20 MG tablet Take 20 mg by mouth at bedtime.     hydroxychloroquine  (PLAQUENIL ) 200 MG tablet TAKE 1 TABLET(200 MG) BY MOUTH DAILY 90 tablet 0   lidocaine -prilocaine  (EMLA ) cream Apply topically.     lidocaine -prilocaine  (EMLA ) cream Apply to affected area once 30 g 3   ondansetron  (ZOFRAN ) 8 MG tablet Take 1 tablet (8 mg total) by mouth every 8 (eight) hours as needed for nausea or vomiting. 30 tablet 6   ondansetron  (ZOFRAN ) 8 MG tablet Take 1 tablet (8 mg total) by mouth every 8 (eight) hours as needed for nausea or vomiting. Start on the third day after chemotherapy. 30 tablet 1   prochlorperazine  (COMPAZINE ) 10  MG tablet Take 1 tablet (10 mg total) by mouth every 6 (six) hours as needed for nausea or vomiting. 30 tablet 1   spironolactone (ALDACTONE) 25 MG tablet Take 25 mg by mouth daily as needed (swelling).     traMADol  (ULTRAM ) 50 MG tablet Take 1-2 tablets (50-100 mg total) by mouth every 6 (six) hours as needed. 60 tablet 0    tretinoin (RETIN-A) 0.1 % cream Apply 1 application  topically at bedtime as needed (acne).     triamterene -hydrochlorothiazide (DYAZIDE) 37.5-25 MG capsule TAKE 1 CAPSULE BY MOUTH DAILY 90 capsule 1   warfarin (COUMADIN ) 5 MG tablet TAKE 1 TABLET(5 MG) BY MOUTH DAILY 30 tablet PRN   No current facility-administered medications for this visit.    PHYSICAL EXAMINATION: ECOG PERFORMANCE STATUS: 1 - Symptomatic but completely ambulatory  Vitals:   03/18/24 1453  BP: (!) 118/57  Pulse: 68  Resp: 18  Temp: 97.7 F (36.5 C)  SpO2: 100%   Filed Weights   03/18/24 1453  Weight: 115 lb 8 oz (52.4 kg)   Abdomen: Not tender or distended.  Patient feels that it feels firm  LABORATORY DATA:  I have reviewed the data as listed    Latest Ref Rng & Units 02/05/2024   11:04 AM 12/19/2023   10:19 AM 10/14/2023   11:25 AM  CMP  Glucose 70 - 99 mg/dL 161  83  95   BUN 6 - 20 mg/dL 12  14  16    Creatinine 0.44 - 1.00 mg/dL 0.96  0.45  4.09   Sodium 135 - 145 mmol/L 137  137  137   Potassium 3.5 - 5.1 mmol/L 4.1  4.1  4.1   Chloride 98 - 111 mmol/L 104  104  102   CO2 22 - 32 mmol/L 30  30  31    Calcium 8.9 - 10.3 mg/dL 8.8  8.9  8.9   Total Protein 6.5 - 8.1 g/dL 6.6  6.8  6.4   Total Bilirubin 0.0 - 1.2 mg/dL 0.5  0.6  0.8   Alkaline Phos 38 - 126 U/L 81  72  71   AST 15 - 41 U/L 33  31  32   ALT 0 - 44 U/L 21  24  29      Lab Results  Component Value Date   WBC 4.9 02/05/2024   HGB 12.9 02/05/2024   HCT 37.9 02/05/2024   MCV 100.3 (H) 02/05/2024   PLT 143 (L) 02/05/2024   NEUTROABS 2.7 02/05/2024    ASSESSMENT & PLAN:  Malignant neoplasm of lower-outer quadrant of right breast of female, estrogen receptor positive (HCC) 08/04/2020: Right breast biopsy T2 N0 M1 stage IV IDC grade 3, triple positive with a Ki-67 of 40%, multiple liver lesions biopsy was positive for breast cancer ER/PR positive HER2 negative with a Ki-67 30% 08/31/2020-12/14/2020: TCHP x6 01/29/2021: Letrozole  and  palbociclib    11/29/2022-06/05/2023: Olaparib    10/24/2022: CT CAP: Multiple new hepatic lesions measuring up to 1.5 cm consistent with metastatic disease, stable 5 mm nodule left costophrenic sulcus 12/05/2021: Guardant360: ATM gene mutation (possible treatment option olaparib ), MSI high: Not detected.  Will refer the patient to genetic testing   Lab review: CA 27-29: 1365 it was 1159 01/28/2023 (was 1049 and previously it was 385 on 10/16/2022)   PET CT scan 03/03/2023: Widespread hypermetabolic metastatic disease within the liver is similar to previous PET/CT, some of the lesions have mildly decreased in size and metabolic activity consistent with response  to treatment.  Small lytic lesions right acromion and right inferior pubic ramus PET/CT 06/02/2023: Interval progression of hepatic metastases.  Progression of bone metastases.  Suspicion of peritoneal disease.   06/13/2023: Guardant360: ATM, T p53, FGF R1 amplification, TMB 4.73 mutations   Recommendation: Xeloda  1000 g p.o. twice daily 14 days on 7 days off.  Started 07/13/2023 Xeloda  toxicities:  Fatigue Nausea: Taking nausea medications   CT CAP 07/16/2023: Interval decrease in the hypodensity in the liver stable bone metastases and stable peritoneal nodules CT CAP 10/20/2023: Breast mass similar, multiple liver metastases similar, scattered peritoneal nodules (some increase noted), bone lesions similar to prior.   Tumor marker: CA 27-29: Showing response. 06/03/2023: 4098 10/14/2023: 1933 12/19/2023: 1650     CT CAP 12/19/2023: Mixed response to therapy.  Hepatic metastatic disease appears improved.  Peritoneal nodularity appears to be the same although some of the lesions may be slightly larger.  Stable bone metastases.  Unchanged right breast mass.   Bone metastases: Last Xgeva  injection was August 2024.  Patient does not want to continue Xgeva  injections.  I offered her Zometa infusions.  She will think about it and let us  know. PET CT:  03/17/2024: worsening peritoneal, omental and mesenteric adenopathy   Recommendation: Biopsy of the breast lesion with ultrasound Start Enhertu every 3 weeks given her previous history of HER2 positive breast cancer  I counseled her about risks and benefits of Enhertu including the risk of cytopenias, hair loss, nausea, liver function changes, interstitial lung disease, fatigue, blood count abnormalities.  Patient is agreeable and willing to proceed. ------------------------------------- Assessment and Plan Assessment & Plan Breast cancer with possible HER2 involvement HER2 status in breast positive, liver negative. Potential HER2 evolution considered. Enhertu proposed as targeted treatment effective regardless of HER2 status. Discussed side effects and treatment plan. - Order breast biopsy with ultrasound guidance to reassess HER2 status and perform KRAS panel. - Initiate Enhertu (trastuzumab  deruxtecan) treatment every three weeks. - Monitor liver function and blood counts regularly. - Educate on potential side effects and management strategies.  Liver metastasis Liver metastasis with HER2 negative status. Enhertu may target if HER2 positive or low.  Abdominal bloating and pain Symptoms likely due to cancer progression and potential partial bowel obstruction. Enhertu expected to alleviate symptoms by reducing tumor burden. - Prescribe magnesium citrate for bowel management every two to three days. - Recommend Miralax with each meal. - Prescribe Percocet for pain management as needed, cautioning about constipation and driving restrictions.  Nausea Nausea likely related to abdominal issues and cancer progression. Enhertu expected to reduce nausea by addressing underlying cancer.  Constipation Constipation likely due to abdominal issues and current treatment. Enhertu may improve symptoms by reducing tumor burden. - Prescribe magnesium citrate for bowel management every two to three  days. - Recommend Miralax with each meal.      Orders Placed This Encounter  Procedures   Consent Attestation for Oncology Treatment    The patient is informed of risks, benefits, side-effects of the prescribed oncology treatment. Potential short term and long term side effects and response rates discussed. After a long discussion, the patient made informed decision to proceed.:   Yes   CBC with Differential (Cancer Center Only)    Standing Status:   Future    Expected Date:   03/24/2024    Expiration Date:   03/24/2025   CMP (Cancer Center only)    Standing Status:   Future    Expected Date:   03/24/2024  Expiration Date:   03/24/2025   CBC with Differential (Cancer Center Only)    Standing Status:   Future    Expected Date:   04/14/2024    Expiration Date:   04/14/2025   CMP (Cancer Center only)    Standing Status:   Future    Expected Date:   04/14/2024    Expiration Date:   04/14/2025   CBC with Differential (Cancer Center Only)    Standing Status:   Future    Expected Date:   05/05/2024    Expiration Date:   05/05/2025   CMP (Cancer Center only)    Standing Status:   Future    Expected Date:   05/05/2024    Expiration Date:   05/05/2025   CBC with Differential (Cancer Center Only)    Standing Status:   Future    Expected Date:   05/26/2024    Expiration Date:   05/26/2025   CMP (Cancer Center only)    Standing Status:   Future    Expected Date:   05/26/2024    Expiration Date:   05/26/2025   CBC with Differential (Cancer Center Only)    Standing Status:   Future    Expected Date:   06/16/2024    Expiration Date:   06/16/2025   CMP (Cancer Center only)    Standing Status:   Future    Expected Date:   06/16/2024    Expiration Date:   06/16/2025   CBC with Differential (Cancer Center Only)    Standing Status:   Future    Expected Date:   07/07/2024    Expiration Date:   07/07/2025   CMP (Cancer Center only)    Standing Status:   Future    Expected Date:   07/07/2024    Expiration Date:    07/07/2025   PHYSICIAN COMMUNICATION ORDER    Please obtain Echo/Muga at baseline and every 3 months during Enhertu treatment.   The patient has a good understanding of the overall plan. she agrees with it. she will call with any problems that may develop before the next visit here. Total time spent: 30 mins including face to face time and time spent for planning, charting and co-ordination of care   Margert Sheerer, MD 03/18/24

## 2024-03-18 NOTE — Assessment & Plan Note (Signed)
 08/04/2020: Right breast biopsy T2 N0 M1 stage IV IDC grade 3, triple positive with a Ki-67 of 40%, multiple liver lesions biopsy was positive for breast cancer ER/PR positive HER2 negative with a Ki-67 30% 08/31/2020-12/14/2020: TCHP x6 01/29/2021: Letrozole  and palbociclib    11/29/2022-06/05/2023: Olaparib    10/24/2022: CT CAP: Multiple new hepatic lesions measuring up to 1.5 cm consistent with metastatic disease, stable 5 mm nodule left costophrenic sulcus 12/05/2021: Guardant360: ATM gene mutation (possible treatment option olaparib ), MSI high: Not detected.  Will refer the patient to genetic testing   Lab review: CA 27-29: 1365 it was 1159 01/28/2023 (was 1049 and previously it was 385 on 10/16/2022)   PET CT scan 03/03/2023: Widespread hypermetabolic metastatic disease within the liver is similar to previous PET/CT, some of the lesions have mildly decreased in size and metabolic activity consistent with response to treatment.  Small lytic lesions right acromion and right inferior pubic ramus PET/CT 06/02/2023: Interval progression of hepatic metastases.  Progression of bone metastases.  Suspicion of peritoneal disease.   06/13/2023: Guardant360: ATM, T p53, FGF R1 amplification, TMB 4.73 mutations   Recommendation: Xeloda  1000 g p.o. twice daily 14 days on 7 days off.  Started 07/13/2023 Xeloda  toxicities:  Fatigue Nausea: Taking nausea medications   CT CAP 07/16/2023: Interval decrease in the hypodensity in the liver stable bone metastases and stable peritoneal nodules CT CAP 10/20/2023: Breast mass similar, multiple liver metastases similar, scattered peritoneal nodules (some increase noted), bone lesions similar to prior.   Tumor marker: CA 27-29: Showing response. 06/03/2023: 3332 10/14/2023: 1933 12/19/2023: 1650    Hand-Foot Syndrome Dry, cracking skin on hands, likely secondary to Xeloda . -Reduced Xeloda  dose to two pills in the morning and one pill in the evening.    Gastrointestinal Side  Effects Patient reports increased gas, likely secondary to Xeloda . No diarrhea reported.   CT CAP 12/19/2023: Mixed response to therapy.  Hepatic metastatic disease appears improved.  Peritoneal nodularity appears to be the same although some of the lesions may be slightly larger.  Stable bone metastases.  Unchanged right breast mass.   Bone metastases: Last Xgeva  injection was August 2024.  Patient does not want to continue Xgeva  injections.  I offered her Zometa infusions.  She will think about it and let us  know. PET CT: 03/17/2024: worsening peritoneal, omental and mesenteric adenopathy

## 2024-03-19 ENCOUNTER — Other Ambulatory Visit: Payer: Self-pay

## 2024-03-19 ENCOUNTER — Other Ambulatory Visit: Payer: Self-pay | Admitting: Hematology and Oncology

## 2024-03-19 DIAGNOSIS — C50511 Malignant neoplasm of lower-outer quadrant of right female breast: Secondary | ICD-10-CM

## 2024-03-23 ENCOUNTER — Other Ambulatory Visit: Payer: Self-pay

## 2024-03-23 ENCOUNTER — Inpatient Hospital Stay

## 2024-03-23 ENCOUNTER — Inpatient Hospital Stay (HOSPITAL_BASED_OUTPATIENT_CLINIC_OR_DEPARTMENT_OTHER): Admitting: Hematology and Oncology

## 2024-03-23 ENCOUNTER — Other Ambulatory Visit: Payer: Self-pay | Admitting: *Deleted

## 2024-03-23 VITALS — BP 116/60 | HR 63 | Temp 97.8°F | Resp 16 | Ht 64.0 in | Wt 115.8 lb

## 2024-03-23 DIAGNOSIS — Z5112 Encounter for antineoplastic immunotherapy: Secondary | ICD-10-CM | POA: Diagnosis not present

## 2024-03-23 DIAGNOSIS — Z170 Estrogen receptor positive status [ER+]: Secondary | ICD-10-CM | POA: Diagnosis not present

## 2024-03-23 DIAGNOSIS — C50511 Malignant neoplasm of lower-outer quadrant of right female breast: Secondary | ICD-10-CM

## 2024-03-23 DIAGNOSIS — Z95828 Presence of other vascular implants and grafts: Secondary | ICD-10-CM

## 2024-03-23 DIAGNOSIS — C787 Secondary malignant neoplasm of liver and intrahepatic bile duct: Secondary | ICD-10-CM

## 2024-03-23 DIAGNOSIS — Z5181 Encounter for therapeutic drug level monitoring: Secondary | ICD-10-CM

## 2024-03-23 LAB — CMP (CANCER CENTER ONLY)
ALT: 15 U/L (ref 0–44)
AST: 27 U/L (ref 15–41)
Albumin: 4.1 g/dL (ref 3.5–5.0)
Alkaline Phosphatase: 89 U/L (ref 38–126)
Anion gap: 4 — ABNORMAL LOW (ref 5–15)
BUN: 17 mg/dL (ref 6–20)
CO2: 31 mmol/L (ref 22–32)
Calcium: 8.9 mg/dL (ref 8.9–10.3)
Chloride: 100 mmol/L (ref 98–111)
Creatinine: 0.97 mg/dL (ref 0.44–1.00)
GFR, Estimated: >60 mL/min
Glucose, Bld: 104 mg/dL — ABNORMAL HIGH (ref 70–99)
Potassium: 4.1 mmol/L (ref 3.5–5.1)
Sodium: 135 mmol/L (ref 135–145)
Total Bilirubin: 0.6 mg/dL (ref 0.0–1.2)
Total Protein: 6.5 g/dL (ref 6.5–8.1)

## 2024-03-23 LAB — CBC WITH DIFFERENTIAL (CANCER CENTER ONLY)
Abs Immature Granulocytes: 0.01 10*3/uL (ref 0.00–0.07)
Basophils Absolute: 0.1 10*3/uL (ref 0.0–0.1)
Basophils Relative: 1 %
Eosinophils Absolute: 0 10*3/uL (ref 0.0–0.5)
Eosinophils Relative: 1 %
HCT: 34.8 % — ABNORMAL LOW (ref 36.0–46.0)
Hemoglobin: 11.8 g/dL — ABNORMAL LOW (ref 12.0–15.0)
Immature Granulocytes: 0 %
Lymphocytes Relative: 25 %
Lymphs Abs: 1.5 10*3/uL (ref 0.7–4.0)
MCH: 33.8 pg (ref 26.0–34.0)
MCHC: 33.9 g/dL (ref 30.0–36.0)
MCV: 99.7 fL (ref 80.0–100.0)
Monocytes Absolute: 0.6 10*3/uL (ref 0.1–1.0)
Monocytes Relative: 11 %
Neutro Abs: 3.8 10*3/uL (ref 1.7–7.7)
Neutrophils Relative %: 62 %
Platelet Count: 114 10*3/uL — ABNORMAL LOW (ref 150–400)
RBC: 3.49 MIL/uL — ABNORMAL LOW (ref 3.87–5.11)
RDW: 13.8 % (ref 11.5–15.5)
WBC Count: 6 10*3/uL (ref 4.0–10.5)
nRBC: 0 % (ref 0.0–0.2)

## 2024-03-23 MED ORDER — SODIUM CHLORIDE 0.9% FLUSH
10.0000 mL | Freq: Once | INTRAVENOUS | Status: AC
  Administered 2024-03-23: 10 mL

## 2024-03-23 MED ORDER — HEPARIN SOD (PORK) LOCK FLUSH 100 UNIT/ML IV SOLN
500.0000 [IU] | Freq: Once | INTRAVENOUS | Status: AC
  Administered 2024-03-23: 500 [IU]

## 2024-03-23 MED FILL — Fosaprepitant Dimeglumine For IV Infusion 150 MG (Base Eq): INTRAVENOUS | Qty: 5 | Status: AC

## 2024-03-23 NOTE — Assessment & Plan Note (Signed)
 08/04/2020: Right breast biopsy T2 N0 M1 stage IV IDC grade 3, triple positive with a Ki-67 of 40%, multiple liver lesions biopsy was positive for breast cancer ER/PR positive HER2 negative with a Ki-67 30% 08/31/2020-12/14/2020: TCHP x6 01/29/2021: Letrozole  and palbociclib    11/29/2022-06/05/2023: Olaparib  07/13/2023-03/24/2023: Xeloda     10/24/2022: CT CAP: Multiple new hepatic lesions measuring up to 1.5 cm consistent with metastatic disease, stable 5 mm nodule left costophrenic sulcus 12/05/2021: Guardant360: ATM gene mutation (possible treatment option olaparib ), MSI high: Not detected.  Will refer the patient to genetic testing   Lab review: CA 27-29: 12/19/2023: 1650   PET CT scan 03/03/2023: Widespread hypermetabolic metastatic disease within the liver is similar to previous PET/CT 06/02/2023: Interval progression of hepatic metastases.  Progression of bone metastases.  Suspicion of peritoneal disease.   06/13/2023: Guardant360: ATM, T p53, FGF R1 amplification, TMB 4.73 mutations ---------------------------------------------------------------------------------------------------------------------------- Current treatment: Enhertu cycle 1 day 1 We will get an echocardiogram soon.  Okay to proceed treatment today without echo Return to clinic in 3 weeks for cycle 2

## 2024-03-23 NOTE — Progress Notes (Signed)
 Per MD okay to treat 03/24/24 with echo from 09/11/2022.  Verbal orders received and placed to obtain STAT echo prior to next tx.

## 2024-03-23 NOTE — Progress Notes (Signed)
 Patient Care Team: Rey Catholic, MD as PCP - General (Family Medicine) Jeri Mons, Nash Bade, MD as Referring Physician (Oncology) Alane Hsu, RN as Oncology Nurse Navigator Auther Bo, RN as Oncology Nurse Navigator Enid Harry, MD as Consulting Physician (General Surgery) Johna Myers, MD as Consulting Physician (Radiation Oncology) Nicholas Bari, MD as Consulting Physician (Rheumatology) Hamp Levine, MD as Consulting Physician (Obstetrics and Gynecology) Darlis Eisenmenger, MD as Consulting Physician (Cardiology) Armbruster, Lendon Queen, MD as Consulting Physician (Gastroenterology) Marvis Sluder, MD as Referring Physician (Ophthalmology) Marvis Sluder, MD as Referring Physician (Ophthalmology) Cameron Cea, MD as Consulting Physician (Hematology and Oncology) Althea Atkinson, RPH-CPP as Pharmacist (Hematology and Oncology)  DIAGNOSIS:  Encounter Diagnosis  Name Primary?   Malignant neoplasm of lower-outer quadrant of right breast of female, estrogen receptor positive (HCC) Yes    SUMMARY OF ONCOLOGIC HISTORY: Oncology History  Malignant neoplasm of lower-outer quadrant of right breast of female, estrogen receptor positive (HCC)  08/04/2020 Initial Diagnosis   Right breast lower outer quadrant biopsy 08/04/2020 for a clinical T2 N0 M1, stage IV invasive ductal carcinoma, grade 3, triple positive, with an MIB-1 of 40%. abdominal MRI 08/24/2020 shows multiple liver lesions highly suspicious for liver metastases. Liver biopsy 09/20/2020 positive for carcinoma, prognostic panel estrogen and progesterone receptor positive, but HER-2 negative (0); MIB-1 of 30%   08/31/2020 - 12/14/2020 Chemotherapy   Neoadjuvant chemotherapy consisting of docetaxel , carboplatin , trastuzumab  and pertuzumab  every 21 days x 6    12/26/2020 Imaging   breast MRI 12/26/2020 showed right breast index lesion decreased to 0.8 cm; 2 additional masses in the right breast  stable abdominal MRI 01/29/2021 shows no active disease in the liver    Chemotherapy   Herceptin  Maintenance initially q 3 weeks, now q 4 weeks   01/29/2021 -  Anti-estrogen oral therapy   anastrozole  and palbociclib     01/29/2023 Genetic Testing   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  Report date is 01/29/2023.   The Multi-Cancer + RNA Panel offered by [facility] includes sequencing and/or deletion/duplication analysis of the following 70 genes:  AIP*, ALK, APC*, ATM*, AXIN2*, BAP1*, BARD1*, BLM*, BMPR1A*, BRCA1*, BRCA2*, BRIP1*, CDC73*, CDH1*, CDK4, CDKN1B*, CDKN2A, CHEK2*, CTNNA1*, DICER1*, EPCAM (del/dup only), EGFR, FH*, FLCN*, GREM1 (promoter dup only), HOXB13, KIT, LZTR1, MAX*, MBD4, MEN1*, MET, MITF, MLH1*, MSH2*, MSH3*, MSH6*, MUTYH*, NF1*, NF2*, NTHL1*, PALB2*, PDGFRA, PMS2*, POLD1*, POLE*, POT1*, PRKAR1A*, PTCH1*, PTEN*, RAD51C*, RAD51D*, RB1*, RET, SDHA* (sequencing only), SDHAF2*, SDHB*, SDHC*, SDHD*, SMAD4*, SMARCA4*, SMARCB1*, SMARCE1*, STK11*, SUFU*, TMEM127*, TP53*, TSC1*, TSC2*, VHL*. RNA analysis is performed for * genes.   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  Report date is 01/29/2023.   The Multi-Cancer + RNA Panel offered by [facility] includes sequencing and/or deletion/duplication analysis of the following 70 genes:  AIP*, ALK, APC*, ATM*, AXIN2*, BAP1*, BARD1*, BLM*, BMPR1A*, BRCA1*, BRCA2*, BRIP1*, CDC73*, CDH1*, CDK4, CDKN1B*, CDKN2A, CHEK2*, CTNNA1*, DICER1*, EPCAM (del/dup only), EGFR, FH*, FLCN*, GREM1 (promoter dup only), HOXB13, KIT, LZTR1, MAX*, MBD4, MEN1*, MET, MITF,  MLH1*, MSH2*, MSH3*, MSH6*, MUTYH*, NF1*, NF2*, NTHL1*, PALB2*, PDGFRA, PMS2*, POLD1*, POLE*,  POT1*, PRKAR1A*, PTCH1*, PTEN*, RAD51C*, RAD51D*, RB1*, RET, SDHA* (sequencing only), SDHAF2*, SDHB*, SDHC*, SDHD*, SMAD4*, SMARCA4*, SMARCB1*, SMARCE1*, STK11*, SUFU*, TMEM127*, TP53*, TSC1*, TSC2*, VHL*. RNA analysis is performed for * genes.     CHIEF COMPLIANT: Tomorrow's cycle 1 Enhertu  HISTORY OF PRESENT ILLNESS:   History of Present Illness Erica Wood is a 58 year old female with metastatic breast cancer who presents with abdominal pain and fullness.  She experiences severe abdominal pain and persistent fullness, affecting her daily activities. Her current medication regimen for nausea includes Aloxi, Emend, Benadryl , and Tylenol , with Zofran  and Compazine  used interchangeably for breakthrough symptoms. Dietary intake is compromised due to discomfort and fullness, leading to difficulty in consuming balanced meals. She focuses on small, frequent meals with an emphasis on protein intake. Concerns about treatment side effects, including fatigue, nausea, and neuropathy, influence her consideration of taking disability leave from work. She has FMLA coverage to support her during periods of illness.     ALLERGIES:  is allergic to beef-derived drug products, latex, sulfa antibiotics, and tape.  MEDICATIONS:  Current Outpatient Medications  Medication Sig Dispense Refill   acetaminophen  (TYLENOL ) 500 MG tablet Take 1,000 mg by mouth every 6 (six) hours as needed for moderate pain or headache.     amphetamine -dextroamphetamine (ADDERALL XR) 20 MG 24 hr capsule Take 1 capsule (20 mg total) by mouth daily. 30 capsule 0   Ascorbic Acid (VITAMIN C PO) Take 1,000 mg by mouth daily.     capecitabine  (XELODA ) 500 MG tablet Take 2 tablets (1,000 mg total) by mouth 2 (two) times daily after a meal. 14 days on 7 days off 56 tablet 6   Cholecalciferol (VITAMIN D3) 75 MCG (3000 UT) TABS Take 3,000 Units  by mouth daily.     Clindamycin-Benzoyl Per, Refr, gel Apply topically.     clobetasol  cream (TEMOVATE ) 0.05 % Apply 1 Application topically 2 (two) times daily. Apply to affected area. 30 g 0   diclofenac  Sodium (VOLTAREN ) 1 % GEL Research Patient: Apply 0.5 grams (1 fingertip) to each hand and each foot twice daily for up to 12 weeks 400 g 0   estradiol  (ESTRACE ) 0.1 MG/GM vaginal cream INSERT 1 APPLICATORFUL VAGINALLY 3 TIMES A WEEK 42.5 g 12   famotidine (PEPCID) 20 MG tablet Take 20 mg by mouth at bedtime.     hydroxychloroquine  (PLAQUENIL ) 200 MG tablet TAKE 1 TABLET(200 MG) BY MOUTH DAILY 90 tablet 0   lidocaine -prilocaine  (EMLA ) cream Apply topically.     lidocaine -prilocaine  (EMLA ) cream Apply to affected area once 30 g 3   Magnesium  Citrate 100 MG TABS Take 1 tablet by mouth daily as needed. 30 tablet 3   ondansetron  (ZOFRAN ) 8 MG tablet Take 1 tablet (8 mg total) by mouth every 8 (eight) hours as needed for nausea or vomiting. 30 tablet 6   ondansetron  (ZOFRAN ) 8 MG tablet Take 1 tablet (8 mg total) by mouth every 8 (eight) hours as needed for nausea or vomiting. Start on the third day after chemotherapy. 30 tablet 1   oxyCODONE -acetaminophen  (PERCOCET/ROXICET) 5-325 MG tablet Take 1 tablet by mouth every 8 (eight) hours as needed for severe pain (pain score 7-10). 30 tablet 0   prochlorperazine  (COMPAZINE ) 10 MG tablet Take 1 tablet (10 mg total) by mouth every 6 (six) hours as needed for nausea or vomiting. 30 tablet 1   spironolactone (ALDACTONE) 25 MG tablet Take 25 mg by mouth daily as needed (swelling).     traMADol  (  ULTRAM ) 50 MG tablet Take 1-2 tablets (50-100 mg total) by mouth every 6 (six) hours as needed. 60 tablet 0   tretinoin (RETIN-A) 0.1 % cream Apply 1 application  topically at bedtime as needed (acne).     triamterene -hydrochlorothiazide (DYAZIDE) 37.5-25 MG capsule TAKE 1 CAPSULE BY MOUTH DAILY 90 capsule 1   warfarin (COUMADIN ) 5 MG tablet TAKE 1 TABLET(5 MG) BY  MOUTH DAILY 30 tablet PRN   No current facility-administered medications for this visit.    PHYSICAL EXAMINATION: ECOG PERFORMANCE STATUS: 1 - Symptomatic but completely ambulatory  Vitals:   03/23/24 1452  BP: 116/60  Pulse: 63  Resp: 16  Temp: 97.8 F (36.6 C)  SpO2: 100%   Filed Weights   03/23/24 1452  Weight: 115 lb 12.8 oz (52.5 kg)      LABORATORY DATA:  I have reviewed the data as listed    Latest Ref Rng & Units 02/05/2024   11:04 AM 12/19/2023   10:19 AM 10/14/2023   11:25 AM  CMP  Glucose 70 - 99 mg/dL 562  83  95   BUN 6 - 20 mg/dL 12  14  16    Creatinine 0.44 - 1.00 mg/dL 1.30  8.65  7.84   Sodium 135 - 145 mmol/L 137  137  137   Potassium 3.5 - 5.1 mmol/L 4.1  4.1  4.1   Chloride 98 - 111 mmol/L 104  104  102   CO2 22 - 32 mmol/L 30  30  31    Calcium 8.9 - 10.3 mg/dL 8.8  8.9  8.9   Total Protein 6.5 - 8.1 g/dL 6.6  6.8  6.4   Total Bilirubin 0.0 - 1.2 mg/dL 0.5  0.6  0.8   Alkaline Phos 38 - 126 U/L 81  72  71   AST 15 - 41 U/L 33  31  32   ALT 0 - 44 U/L 21  24  29      Lab Results  Component Value Date   WBC 6.0 03/23/2024   HGB 11.8 (L) 03/23/2024   HCT 34.8 (L) 03/23/2024   MCV 99.7 03/23/2024   PLT 114 (L) 03/23/2024   NEUTROABS 3.8 03/23/2024    ASSESSMENT & PLAN:  Malignant neoplasm of lower-outer quadrant of right breast of female, estrogen receptor positive (HCC) 08/04/2020: Right breast biopsy T2 N0 M1 stage IV IDC grade 3, triple positive with a Ki-67 of 40%, multiple liver lesions biopsy was positive for breast cancer ER/PR positive HER2 negative with a Ki-67 30% 08/31/2020-12/14/2020: TCHP x6 01/29/2021: Letrozole  and palbociclib    11/29/2022-06/05/2023: Olaparib  07/13/2023-03/24/2023: Xeloda     10/24/2022: CT CAP: Multiple new hepatic lesions measuring up to 1.5 cm consistent with metastatic disease, stable 5 mm nodule left costophrenic sulcus 12/05/2021: Guardant360: ATM gene mutation (possible treatment option olaparib ), MSI high: Not  detected.  Will refer the patient to genetic testing   Lab review: CA 27-29: 12/19/2023: 1650   PET CT scan 03/03/2023: Widespread hypermetabolic metastatic disease within the liver is similar to previous PET/CT 06/02/2023: Interval progression of hepatic metastases.  Progression of bone metastases.  Suspicion of peritoneal disease.   06/13/2023: Guardant[facility]: ATM, T p53, FGF R1 amplification, TMB 4.73 mutations ---------------------------------------------------------------------------------------------------------------------------- Current treatment: Enhertu cycle 1 day 1 We will get an echocardiogram soon.  Okay to proceed treatment today without echo Return to clinic in 3 weeks for cycle 2   Assessment & Plan Breast cancer with metastases Breast cancer with distant metastases, treated with fam-trastuzumab  deruxtecan-nxki  to reduce tumor burden and improve symptoms. Referral to major centers for clinical trials if ineffective. Mild neuropathy possible. - Continue fam-trastuzumab  deruxtecan-nxki every 21 days. - Monitor treatment response and symptoms.  Nausea due to chemotherapy Nausea from chemotherapy managed with Aloxi, Emend, and Compazine . Explained medication roles and advised on dietary modifications. - Administer Aloxi and Emend as part of the nausea prevention regimen. - Use Compazine  and Zofran  as needed for breakthrough nausea. - Advise on dietary modifications to manage nausea, including small, frequent meals and focusing on protein intake.  Chemotherapy-induced fatigue Fatigue expected post-chemotherapy, peaking around day four. Advised on management and potential dose adjustment if severe. - Monitor fatigue levels and adjust activities accordingly. - Consider dose adjustment if fatigue becomes prolonged or severe.  Chemotherapy-induced neuropathy Potential for mild neuropathy with current regimen. Reassured about mild nature and possible dose adjustment. - Monitor for signs  of neuropathy. - Adjust chemotherapy dose if neuropathy becomes significant.      No orders of the defined types were placed in this encounter.  The patient has a good understanding of the overall plan. she agrees with it. she will call with any problems that may develop before the next visit here. Total time spent: 30 mins including face to face time and time spent for planning, charting and co-ordination of care   Viinay K Geovanny Sartin, MD 03/23/24

## 2024-03-24 ENCOUNTER — Ambulatory Visit (HOSPITAL_BASED_OUTPATIENT_CLINIC_OR_DEPARTMENT_OTHER)
Admission: RE | Admit: 2024-03-24 | Discharge: 2024-03-24 | Disposition: A | Discharge status: Home / Self Care | Source: Ambulatory Visit | Attending: Hematology and Oncology | Admitting: Hematology and Oncology

## 2024-03-24 ENCOUNTER — Inpatient Hospital Stay: Payer: 59

## 2024-03-24 ENCOUNTER — Inpatient Hospital Stay

## 2024-03-24 VITALS — BP 110/64 | HR 64 | Temp 98.1°F | Resp 16

## 2024-03-24 DIAGNOSIS — Z170 Estrogen receptor positive status [ER+]: Secondary | ICD-10-CM

## 2024-03-24 DIAGNOSIS — Z5181 Encounter for therapeutic drug level monitoring: Secondary | ICD-10-CM | POA: Insufficient documentation

## 2024-03-24 DIAGNOSIS — Z5112 Encounter for antineoplastic immunotherapy: Secondary | ICD-10-CM | POA: Diagnosis not present

## 2024-03-24 DIAGNOSIS — I351 Nonrheumatic aortic (valve) insufficiency: Secondary | ICD-10-CM | POA: Insufficient documentation

## 2024-03-24 DIAGNOSIS — Z79899 Other long term (current) drug therapy: Secondary | ICD-10-CM | POA: Insufficient documentation

## 2024-03-24 LAB — CANCER ANTIGEN 27.29: CA 27.29: 3141.7 U/mL — ABNORMAL HIGH (ref 0.0–38.6)

## 2024-03-24 LAB — ECHOCARDIOGRAM COMPLETE
AR max vel: 2.42 cm2
AV Peak grad: 6 mmHg
Ao pk vel: 1.22 m/s
Area-P 1/2: 3.53 cm2
Calc EF: 58.2 %
MV VTI: 2.91 cm2
P 1/2 time: 656 ms
S' Lateral: 3.6 cm
Single Plane A2C EF: 58.2 %
Single Plane A4C EF: 58.8 %

## 2024-03-24 MED ORDER — DEXTROSE 5 % IV SOLN: INTRAVENOUS | Status: DC

## 2024-03-24 MED ORDER — DIPHENHYDRAMINE HCL 25 MG PO CAPS
25.0000 mg | ORAL_CAPSULE | Freq: Once | ORAL | Status: AC
  Administered 2024-03-24: 25 mg via ORAL
  Filled 2024-03-24: qty 1

## 2024-03-24 MED ORDER — PALONOSETRON HCL INJECTION 0.25 MG/5ML
0.2500 mg | Freq: Once | INTRAVENOUS | Status: AC
  Administered 2024-03-24: 0.25 mg via INTRAVENOUS
  Filled 2024-03-24: qty 5

## 2024-03-24 MED ORDER — SODIUM CHLORIDE 0.9 % IV SOLN
150.0000 mg | Freq: Once | INTRAVENOUS | Status: AC
  Administered 2024-03-24: 150 mg via INTRAVENOUS
  Filled 2024-03-24: qty 150

## 2024-03-24 MED ORDER — ACETAMINOPHEN 325 MG PO TABS
650.0000 mg | ORAL_TABLET | Freq: Once | ORAL | Status: AC
  Administered 2024-03-24: 650 mg via ORAL
  Filled 2024-03-24: qty 2

## 2024-03-24 MED ORDER — FAM-TRASTUZUMAB DERUXTECAN-NXKI CHEMO 100 MG IV SOLR
5.4000 mg/kg | Freq: Once | INTRAVENOUS | Status: AC
  Administered 2024-03-24: 300 mg via INTRAVENOUS
  Filled 2024-03-24: qty 15

## 2024-03-24 NOTE — Patient Instructions (Signed)
 [facility] - A DEPT OF [facility]  Discharge Instructions: Thank you for choosing [facility] to provide your oncology and hematology care.   If you have a lab appointment with the Cancer Center, please go directly to the Cancer Center and check in at the registration area.   Wear comfortable clothing and clothing appropriate for easy access to any Portacath or PICC line.   We strive to give you quality time with your provider. You may need to reschedule your appointment if you arrive late (15 or more minutes).  Arriving late affects you and other patients whose appointments are after yours.  Also, if you miss three or more appointments without notifying the office, you may be dismissed from the clinic at the provider's discretion.      For prescription refill requests, have your pharmacy contact our office and allow 72 hours for refills to be completed.    Today you received the following chemotherapy and/or immunotherapy agents enhertu      To help prevent nausea and vomiting after your treatment, we encourage you to take your nausea medication as directed.  BELOW ARE SYMPTOMS THAT SHOULD BE REPORTED IMMEDIATELY: *FEVER GREATER THAN 100.4 F (38 C) OR HIGHER *CHILLS OR SWEATING *NAUSEA AND VOMITING THAT IS NOT CONTROLLED WITH YOUR NAUSEA MEDICATION *UNUSUAL SHORTNESS OF BREATH *UNUSUAL BRUISING OR BLEEDING *URINARY PROBLEMS (pain or burning when urinating, or frequent urination) *BOWEL PROBLEMS (unusual diarrhea, constipation, pain near the anus) TENDERNESS IN MOUTH AND THROAT WITH OR WITHOUT PRESENCE OF ULCERS (sore throat, sores in mouth, or a toothache) UNUSUAL RASH, SWELLING OR PAIN  UNUSUAL VAGINAL DISCHARGE OR ITCHING   Items with * indicate a potential emergency and should be followed up as soon as possible or go to the Emergency Department if any problems should occur.  Please show the CHEMOTHERAPY ALERT CARD or IMMUNOTHERAPY  ALERT CARD at check-in to the Emergency Department and triage nurse.  Should you have questions after your visit or need to cancel or reschedule your appointment, please contact [facility] - A DEPT [facility]  Dept: [phone]  and follow the prompts.  Office hours are 8:00 a.m. to 4:30 p.m. Monday - Friday. Please note that voicemails left after 4:00 p.m. may not be returned until the following business day.  We are closed weekends and major holidays. You have access to a nurse at all times for urgent questions. Please call the main number to the clinic Dept: [phone] and follow the prompts.   For any non-urgent questions, you may also contact your provider using MyChart. We now offer e-Visits for anyone 60 and older to request care online for non-urgent symptoms. For details visit [email or web address].   Also download the MyChart app! Go to the app store, search "MyChart", open the app, select [place], and log in with your MyChart username and password.

## 2024-03-25 ENCOUNTER — Telehealth: Payer: Self-pay

## 2024-03-25 NOTE — Telephone Encounter (Signed)
 Erica Wood states that she is doing fine. She is eating, drinking, and urinating well. She knows to call the office at [phone] if  she has any questions or concerns.

## 2024-03-25 NOTE — Progress Notes (Unsigned)
 Office Visit Note  Patient: Erica Wood             Date of Birth: Sep 03, 1966           MRN: [id number]             PCP: Rey Catholic, MD Referring: Rey Catholic, MD Visit Date: 04/08/2024 Occupation: @GUAROCC @  Subjective:  Medication monitoring   History of Present Illness: Erica Wood is a 58 y.o. female with history of systemic lupus and inflammatory arthritis.  Patient remains on plaquenil  200 mg 1 tablet by mouth daily.  She is tolerating Plaquenil  without any side effects and has not had any recent gaps in therapy.  She denies any signs or symptoms of a systemic lupus flare.  She has not noticed any increased inflammation in her hands or knee joints recently.  Patient's primary concern has been her level of fatigue and nausea which she attributes to chemotherapy.  Patient has been initiated on enhertu  every 3 weeks.  She has had 1 dose so far.    Activities of Daily Living:  Patient reports morning stiffness for a few minutes.   Patient Reports nocturnal pain.  Difficulty dressing/grooming: Denies Difficulty climbing stairs: Reports Difficulty getting out of chair: Denies Difficulty using hands for taps, buttons, cutlery, and/or writing: Reports  Review of Systems  Constitutional:  Positive for fatigue.  HENT:  Negative for mouth sores and mouth dryness.   Eyes:  Negative for dryness.  Respiratory:  Negative for shortness of breath.   Cardiovascular:  Negative for chest pain and palpitations.  Gastrointestinal:  Positive for abdominal pain, constipation and nausea. Negative for blood in stool and diarrhea.  Endocrine: Negative for increased urination.  Genitourinary:  Negative for involuntary urination.  Musculoskeletal:  Positive for joint pain, joint pain, joint swelling, myalgias, muscle weakness, morning stiffness, muscle tenderness and myalgias.  Skin:  Positive for color change. Negative for rash, hair loss and sensitivity to sunlight.   Allergic/Immunologic: Negative for susceptible to infections.  Neurological:  Positive for headaches. Negative for dizziness.  Hematological:  Negative for swollen glands.  Psychiatric/Behavioral:  Positive for sleep disturbance. Negative for depressed mood. The patient is nervous/anxious.     PMFS History:  Patient Active Problem List   Diagnosis Date Noted   Monoallelic mutation of ATM gene 78/29/5621   Genetic testing 02/04/2023   Goals of care, counseling/discussion 05/09/2021   Malignant neoplasm metastatic to liver (HCC) 01/03/2021   Port-A-Cath in place 11/02/2020   Aortic atherosclerosis (HCC) 09/21/2020   Malignant neoplasm of lower-outer quadrant of right breast of female, estrogen receptor positive (HCC) 08/10/2020   Abnormal cervical Papanicolaou smear 07/12/2020   Pulmonary embolism (HCC) 07/12/2020   Fibrocystic disease of breast 01/22/2018   Primary osteoarthritis of both knees 05/02/2017   Raynaud's disease without gangrene 11/26/2016   Discoid lupus 11/26/2016   Anticardiolipin antibody positive 11/26/2016   Autoimmune disease (HCC) 11/25/2016   High risk medication use 11/25/2016   Primary osteoarthritis of both hands 11/25/2016   History of DVT (deep vein thrombosis) 11/25/2016   History of pulmonary embolism 11/25/2016   Primary insomnia 11/25/2016   Anxiety 11/25/2016   Irritable bowel syndrome with constipation 11/25/2016   Raynaud's phenomenon 01/03/2016   Varicose veins of bilateral lower extremities with other complications 05/24/2014   Spider veins of both lower extremities 05/24/2014    Past Medical History:  Diagnosis Date   Anti-cardiolipin antibody positive    Anti-cardiolipin antibody syndrome (HCC)  Anxiety    Arthralgia    Arthritis    bil knees, hands   Atypical chest pain    Autoimmune disease (HCC)    Autoimmune disease (HCC) 11/25/2016   Breast cancer metastasized to liver (HCC) 09/2020   Cancer (HCC) 07/2020   right breast IDC    Chronic fatigue    Depression    DVT (deep venous thrombosis) (HCC)    IBS (irritable bowel syndrome)    with constipation   Lupus    Lymphedema    bilateral legs per patient   Monoallelic mutation of ATM gene 09/81/1914   Pulmonary embolus (HCC)    Raynaud's syndrome    Sicca (HCC)    Thrombocytopenia (HCC)     Family History  Problem Relation Age of Onset   Other Mother        blood clots   Heart attack Mother    Rheum arthritis Father    Hemachromatosis Father    Heart Problems Father    Cancer - Other Sister        Ewing sarcoma; d. 53   Other Brother        blood clots   Lung cancer Maternal Aunt        smoking hx; 99s   Cancer Paternal Grandmother 73       unknown type   Colon cancer Neg Hx    Stomach cancer Neg Hx    Esophageal cancer Neg Hx    Pancreatic cancer Neg Hx    Liver disease Neg Hx    Past Surgical History:  Procedure Laterality Date   ABLATION  12/2018   leg veins   ABLATION Right 08/2019   venous leg    APPENDECTOMY     BREAST BIOPSY Right 03/30/2024   US  RT BREAST BX W LOC DEV 1ST LESION IMG BX SPEC US  GUIDE 03/30/2024 [facility]   COLON SURGERY     Perforated colon repair after colostomy   EYE SURGERY Bilateral 2022   GANGLION CYST EXCISION     HYSTEROSCOPY WITH D & C N/A 01/13/2014   Procedure: DILATATION AND CURETTAGE /HYSTEROSCOPY with resection ;  Surgeon: Hamp Levine, MD;  Location: [facility];  Service: Gynecology;  Laterality: N/A;   LAPAROTOMY     PORTACATH PLACEMENT N/A 09/13/2020   Procedure: INSERTION PORT-A-CATH WITH ULTRASOUND GUIDANCE;  Surgeon: Enid Harry, MD;  Location: [facility];  Service: General;  Laterality: N/A;   Social History   Social History Narrative   Not on file   Immunization History  Administered Date(s) Administered   PFIZER Comirnaty(Gray Top)Covid-19 Tri-Sucrose Vaccine 08/11/2020     Objective: Vital Signs: BP 106/68 (BP Location: Left Arm, Patient Position:  Sitting, Cuff Size: Normal)   Pulse 65   Resp 13   Ht 5\' 4"  (1.626 m)   Wt 115 lb (52.2 kg) Comment: per patient  BMI 19.74 kg/m    Physical Exam Vitals and nursing note reviewed.  Constitutional:      Appearance: She is well-developed.  HENT:     Head: Normocephalic and atraumatic.  Eyes:     Conjunctiva/sclera: Conjunctivae normal.  Cardiovascular:     Rate and Rhythm: Normal rate and regular rhythm.     Heart sounds: Normal heart sounds.  Pulmonary:     Effort: Pulmonary effort is normal.     Breath sounds: Normal breath sounds.  Abdominal:     General: Bowel sounds are normal.     Palpations: Abdomen  is soft.  Musculoskeletal:     Cervical back: Normal range of motion.  Lymphadenopathy:     Cervical: No cervical adenopathy.  Skin:    General: Skin is warm and dry.     Capillary Refill: Capillary refill takes less than 2 seconds.  Neurological:     Mental Status: She is alert and oriented to person, place, and time.  Psychiatric:        Behavior: Behavior normal.      Musculoskeletal Exam: C-spine, thoracic spine, lumbar spine good range of motion.  Shoulder joints, elbow joints, wrist joints, MCPs, PIPs, DIPs have good range of motion with no synovitis.  Synovial thickening of bilateral 1st through 3rd MCP joints but no synovitis was noted.  PIP and DIP thickening noted.  Hip joints have good range of motion with no groin pain.  Knee joints have good range of motion with no warmth or effusion.  Ankle joints have good range of motion with no tenderness or joint swelling.  CDAI Exam: CDAI Score: -- Patient Global: --; Provider Global: -- Swollen: --; Tender: -- Joint Exam 04/08/2024   No joint exam has been documented for this visit   There is currently no information documented on the homunculus. Go to the Rheumatology activity and complete the homunculus joint exam.  Investigation: No additional findings.  Imaging: US  RT BREAST BX W LOC DEV 1ST LESION IMG BX  SPEC US  GUIDE Addendum Date: 03/31/2024 ADDENDUM REPORT: 03/31/2024 10:03 ADDENDUM: Pathology revealed GRADE II INVASIVE MAMMARY CARCINOMA, NO SPECIAL TYPE (DUCTAL), DUCTAL CARCINOMA IN SITU: PRESENT, INTERMEDIATE GRADE, CALCIFICATIONS: NOT IDENTIFIED, ADJACENT FIBROADENOMA of the RIGHT breast, 6:00 o'clock, 1 cmfn, (heart clip). This was found to be concordant by Dr. Luann Rundle Mir. Pathology results were discussed with the patient by telephone. The patient reported doing well after the biopsy with tenderness at the site. Post biopsy instructions and care were reviewed and questions were answered. The patient was encouraged to call [facility] for any additional concerns. My direct phone number was provided. The patient has a recent diagnosis of RIGHT breast cancer and should follow her outlined treatment plan. Biopsy results were sent to the patient's care team via secure EPIC message on Mar 31, 2024. Pathology results reported by Kraig Peru, RN on 03/31/2024. Electronically Signed   By: Elester Grim M.D.   On: 03/31/2024 10:03   Result Date: 03/31/2024 CLINICAL DATA:  58 year old woman with known metastatic breast cancer persistence for repeat ultrasound-guided biopsy of known RIGHT breast mass at 6 o'clock 1 CMFN. EXAM: ULTRASOUND GUIDED RIGHT BREAST CORE NEEDLE BIOPSY COMPARISON:  Previous exam(s). PROCEDURE: I met with the patient and we discussed the procedure of ultrasound-guided biopsy, including benefits and alternatives. We discussed the high likelihood of a successful procedure. We discussed the risks of the procedure, including infection, bleeding, tissue injury, clip migration, and inadequate sampling. Informed written consent was given. The usual time-out protocol was performed immediately prior to the procedure. Lesion quadrant: Lower inner quadrant Using sterile technique and 1% Lidocaine  as local anesthetic, under direct ultrasound visualization, a 14 gauge  spring-loaded device was used to perform biopsy of of the RIGHT breast mass at 6 o'clock 1 CMFN using a lateral approach. At the conclusion of the procedure heart shaped tissue marker clip was deployed into the biopsy cavity. Follow up 2 view mammogram was performed and dictated separately. IMPRESSION: Ultrasound guided biopsy of 2.9 cm RIGHT breast mass at 6 o'clock 1 CMFN. No apparent complications.  Electronically Signed: By: Elester Grim M.D. On: 03/30/2024 16:01   US  LIMITED ULTRASOUND INCLUDING AXILLA RIGHT BREAST Result Date: 03/30/2024 CLINICAL DATA:  58 year old woman with biopsy-proven RIGHT breast invasive ductal carcinoma presents for repeat biopsy of the mass for additional tissue sampling. EXAM: ULTRASOUND OF THE RIGHT BREAST COMPARISON:  Previous exam(s). FINDINGS: Targeted sonographic evaluation of the RIGHT breast demonstrates heterogeneous mixed echogenicity mass measuring 2.9 x 1.7 x 2.0 cm at 6 o'clock 1 CMFN. This mass has irregular spiculated margins. No enlarged RIGHT axillary lymph nodes are seen. IMPRESSION: 2.9 cm biopsy-proven malignancy of the RIGHT breast (6 o'clock 1 CMFN) RECOMMENDATION: Ultrasound-guided core needle biopsy. I have discussed the findings and recommendations with the patient. If applicable, a reminder letter will be sent to the patient regarding the next appointment. BI-RADS CATEGORY  6: Known biopsy-proven malignancy. Electronically Signed   By: Elester Grim M.D.   On: 03/30/2024 16:05   MM CLIP PLACEMENT RIGHT Result Date: 03/30/2024 CLINICAL DATA:  Status post ultrasound-guided biopsy of RIGHT breast mass EXAM: 3D DIAGNOSTIC RIGHT MAMMOGRAM POST ULTRASOUND BIOPSY COMPARISON:  Previous exam(s). ACR Breast Density Category d: The breasts are extremely dense, which lowers the sensitivity of mammography. FINDINGS: 3D Mammographic images were obtained following ultrasound guided biopsy of RIGHT breast mass at 6 o'clock 1 CMFN. The biopsy marking clip is in expected  position at the site of biopsy. IMPRESSION: Appropriate positioning of the heart shaped biopsy marking clip at the site of biopsy in the lower RIGHT breast. Final Assessment: Post Procedure Mammograms for Marker Placement Electronically Signed   By: Elester Grim M.D.   On: 03/30/2024 16:03   ECHOCARDIOGRAM COMPLETE Result Date: 03/24/2024    ECHOCARDIOGRAM REPORT   Patient Name:   SYDELLE SHERFIELD [facility] Date of Exam: 03/24/2024 Medical Rec #:  [id number]            Height:       64.0 in Accession #:    [id number]           Weight:       115.8 lb Date of Birth:  Feb 08, 1966            BSA:          1.551 m Patient Age:    58 years             BP:           110/64 mmHg Patient Gender: F                    HR:           54 bpm. Exam Location:  Outpatient Procedure: 2D Echo, Cardiac Doppler, Color Doppler and Strain Analysis (Both            Spectral and Color Flow Doppler were utilized during procedure). Indications:    Chemo  History:        Patient has prior history of Echocardiogram examinations, most                 recent 09/11/2022. Risk Factors:Former Smoker.  Sonographer:    Willey Harrier Referring Phys: [id number] Cameron Cea IMPRESSIONS  1. Left ventricular ejection fraction, by estimation, is 55 to 60%. Left ventricular ejection fraction by 2D MOD biplane is 58.2 %. The left ventricle has normal function. The left ventricle has no regional wall motion abnormalities. Left ventricular diastolic parameters were normal. The average left ventricular global longitudinal strain is -24.1 %. The global longitudinal strain  is normal.  2. Right ventricular systolic function is low normal. The right ventricular size is normal. There is normal pulmonary artery systolic pressure. The estimated right ventricular systolic pressure is 29.3 mmHg.  3. The mitral valve is grossly normal. Trivial mitral valve regurgitation.  4. There appears to be significant diastolic aortic leaflet chattering. The aortic valve is tricuspid. Aortic  valve regurgitation is mild and eccentric, directed toward the anterior mitral valve leaflet.  5. The inferior vena cava is normal in size with <50% respiratory variability, suggesting right atrial pressure of 8 mmHg.  6. Left atrial size was mildly dilated. Comparison(s): Changes from prior study are noted. 09/11/2022: LVEF 55-60%, GLS -21.5%, mild AI. FINDINGS  Left Ventricle: Left ventricular ejection fraction, by estimation, is 55 to 60%. Left ventricular ejection fraction by 2D MOD biplane is 58.2 %. The left ventricle has normal function. The left ventricle has no regional wall motion abnormalities. The average left ventricular global longitudinal strain is -24.1 %. Strain was performed and the global longitudinal strain is normal. The left ventricular internal cavity size was normal in size. There is no left ventricular hypertrophy. Left ventricular diastolic parameters were normal. Right Ventricle: The right ventricular size is normal. No increase in right ventricular wall thickness. Right ventricular systolic function is low normal. There is normal pulmonary artery systolic pressure. The tricuspid regurgitant velocity is 2.31 m/s,  and with an assumed right atrial pressure of 8 mmHg, the estimated right ventricular systolic pressure is 29.3 mmHg. Left Atrium: Left atrial size was mildly dilated. Right Atrium: Right atrial size was normal in size. Pericardium: There is no evidence of pericardial effusion. Mitral Valve: The mitral valve is grossly normal. Trivial mitral valve regurgitation. MV peak gradient, 2.1 mmHg. The mean mitral valve gradient is 1.0 mmHg. Tricuspid Valve: The tricuspid valve is grossly normal. Tricuspid valve regurgitation is trivial. Aortic Valve: There appears to be significant diastolic aortic leaflet chattering. The aortic valve is tricuspid. Aortic valve regurgitation is mild. Aortic regurgitation PHT measures 656 msec. Aortic valve peak gradient measures 6.0 mmHg. Pulmonic Valve:  The pulmonic valve was normal in structure. Pulmonic valve regurgitation is not visualized. Aorta: The aortic root and ascending aorta are structurally normal, with no evidence of dilitation. Venous: The inferior vena cava is normal in size with less than 50% respiratory variability, suggesting right atrial pressure of 8 mmHg. IAS/Shunts: No atrial level shunt detected by color flow Doppler.  LEFT VENTRICLE PLAX 2D                        Biplane EF (MOD) LVIDd:         5.00 cm         LV Biplane EF:   Left LVIDs:         3.60 cm                          ventricular LV PW:         0.50 cm                          ejection LV IVS:        0.50 cm                          fraction by LVOT diam:     1.90 cm  2D MOD LV SV:         68                               biplane is LV SV Index:   44                               58.2 %. LVOT Area:     2.84 cm                                Diastology                                LV e' medial:    12.00 cm/s LV Volumes (MOD)               LV E/e' medial:  5.8 LV vol d, MOD    90.7 ml       LV e' lateral:   13.30 cm/s A2C:                           LV E/e' lateral: 5.2 LV vol d, MOD    91.9 ml A4C:                           2D Longitudinal LV vol s, MOD    37.9 ml       Strain A2C:                           2D Strain GLS   -25.7 % LV vol s, MOD    37.9 ml       (A4C): A4C:                           2D Strain GLS   -24.0 % LV SV MOD A2C:   52.8 ml       (A3C): LV SV MOD A4C:   91.9 ml       2D Strain GLS   -22.7 % LV SV MOD BP:    53.5 ml       (A2C):                                2D Strain GLS   -24.1 %                                Avg: RIGHT VENTRICLE             IVC RV Basal diam:  3.40 cm     IVC diam: 1.90 cm RV S prime:     10.20 cm/s TAPSE (M-mode): 2.2 cm LEFT ATRIUM             Index        RIGHT ATRIUM           Index LA diam:        2.90 cm 1.87 cm/m   RA Area:     13.60 cm LA Vol (A2C):  53.9 ml 34.75 ml/m  RA Volume:   31.60 ml  20.37  ml/m LA Vol (A4C):   25.7 ml 16.57 ml/m LA Biplane Vol: 38.0 ml 24.50 ml/m  AORTIC VALVE AV Area (Vmax): 2.42 cm AV Vmax:        122.00 cm/s AV Peak Grad:   6.0 mmHg LVOT Vmax:      104.00 cm/s LVOT Vmean:     64.100 cm/s LVOT VTI:       0.241 m AI PHT:         656 msec  AORTA Ao Root diam: 3.00 cm Ao Asc diam:  3.40 cm MITRAL VALVE               TRICUSPID VALVE MV Area (PHT): 3.53 cm    TR Peak grad:   21.3 mmHg MV Area VTI:   2.91 cm    TR Vmax:        231.00 cm/s MV Peak grad:  2.1 mmHg MV Mean grad:  1.0 mmHg    SHUNTS MV Vmax:       0.72 m/s    Systemic VTI:  0.24 m MV Vmean:      43.1 cm/s   Systemic Diam: 1.90 cm MV Decel Time: 215 msec MV E velocity: 69.80 cm/s MV A velocity: 43.70 cm/s MV E/A ratio:  1.60 Dinah Franco MD Electronically signed by Dinah Franco MD Signature Date/Time: 03/24/2024/2:50:52 PM    Final    NM PET Image Restag (PS) Skull Base To Thigh Result Date: 03/17/2024 CLINICAL DATA:  Subsequent treatment strategy for breast cancer. EXAM: NUCLEAR MEDICINE PET SKULL BASE TO THIGH TECHNIQUE: 5.7 mCi F-18 FDG was injected intravenously. Full-ring PET imaging was performed from the skull base to thigh after the radiotracer. CT data was obtained and used for attenuation correction and anatomic localization. Fasting blood glucose: 86 mg/dl COMPARISON:  09/81/1914, and also CT scan from 12/19/2023 FINDINGS: Mediastinal blood pool activity: SUV max 2.1 Liver activity: SUV max NA NECK: No significant abnormal hypermetabolic activity in this region. Incidental CT findings: None. CHEST: Right breast mass measuring roughly 2.2 cm diameter noted with maximum SUV 5.5 (formerly 5.7). Incidental CT findings: Right Port-A-Cath tip: SVC. Atheromatous vascular calcification of the aortic arch. Small calcified nodules in both lungs favoring granulomas. ABDOMEN/PELVIS: Centrally necrotic lesion in the lateral segment left hepatic lobe has maximum SUV of 6.6, formerly 7.2. Some of the previously  hypermetabolic lesions along the dome of the liver shown on June 02, 2023 are no longer present. Other scattered hypermetabolic lesions are present in the liver. No substantial progression of the liver involvement is observed. There is hypermetabolic studding of the omental and peritoneal surfaces. In some locations this is difficult to separate from bowel activity, for example along the right paracolic gutter. However, omental nodularity in the left upper quadrant representative region has a maximum SUV of 3.0. The omental/peritoneal involvement is increased from 06/02/2023 and is likewise more readily appreciable on the CT data compared to the CT scan from 12/19/2023, compatible with increase. A tumor nodule along the left lower quadrant measures 2.0 by 1.6 cm on image 143 series 4 with maximum SUV 6.3, substantially enlarged from 12/19/2023 where this measured 1.1 by 0.7 cm. Mesenteric nodule measuring up to 3.1 cm on image 142 series 4 with maximum SUV 5.8, previously 2.5 cm in long axis on 12/19/2023. Incidental CT findings: None. SKELETON: Mostly lucent metastatic lesions observed at L3, L4, and in the right ischial tuberosity and right  inferior pubic ramus. These are stable to mildly worsened from prior, with the right inferior pubic ramus lesion with maximum SUV of 5.2, formerly 4.3. No definite new bony lesions observed. Incidental CT findings: None. IMPRESSION: 1. Worsening peritoneal, omental, and mesenteric malignancy compared to 06/02/2023 and also compared to 12/19/2023. 2. Some of the previously hypermetabolic lesions along the dome of the liver shown on June 02, 2023 are no longer present. Other scattered hypermetabolic lesions are present in the liver, without substantial progression of the liver involvement. 3. Right breast mass measuring roughly 2.2 cm diameter with maximum SUV 5.5 (formerly 5.7). 4. Mostly lucent metastatic lesions at L3, L4, and in the right ischial tuberosity and right inferior  pubic ramus. These are stable to mildly worsened from prior, with the right inferior pubic ramus lesion with maximum SUV of 5.2, formerly 4.3. No definite new bony lesions observed. 5. Small calcified nodules in both lungs favoring granulomas. 6.  Aortic Atherosclerosis (ICD10-I70.0). Electronically Signed   By: Freida Jes M.D.   On: 03/17/2024 15:06    Recent Labs: Lab Results  Component Value Date   WBC 4.7 04/01/2024   HGB 12.7 04/01/2024   PLT 97 (L) 04/01/2024   NA 135 04/01/2024   K 4.1 04/01/2024   CL 100 04/01/2024   CO2 31 04/01/2024   GLUCOSE 91 04/01/2024   BUN 16 04/01/2024   CREATININE 0.98 04/01/2024   BILITOT 0.6 04/01/2024   ALKPHOS 90 04/01/2024   AST 50 (H) 04/01/2024   ALT 35 04/01/2024   PROT 6.8 04/01/2024   ALBUMIN 4.2 04/01/2024   CALCIUM 9.0 04/01/2024   GFRAA 95 07/28/2020   QFTBGOLDPLUS Negative 12/19/2021    Speciality Comments: PLQ Eye Exam: 04/23/2023 WNL @ [facility] follow up in 1 year. stage IB invasive ductal carcinoma, grade 3 08/16/20  Procedures:  No procedures performed Allergies: Beef-derived drug products, Latex, Sulfa antibiotics, and Tape     Assessment / Plan:     Visit Diagnoses: SLE (systemic lupus erythematosus related syndrome) (HCC) - Positive ANA and positive double-stranded DNA, hypocomplementemia, inflammatory arthritis, raynauds phenomenon and discoid lupus: She has not had any signs or symptoms of a systemic lupus flare.  She has clinically been doing well taking Plaquenil  200 mg 1 tablet by mouth daily.  She has been tolerating Plaquenil  without any side effects and has not had any recent gaps in therapy. Lab work from 10/30/2023 was reviewed today in the office: dsDNA negative, complements within normal limits, ESR within normal limits, ANA negative, and urine protein creatinine ratio WNL. Future orders for the following lab work were placed today for [facility] as requested.  She will remain on Plaquenil  as  prescribed.  She is advised to notify us  if she develops signs or symptoms of a flare. She will follow-up in the office in 5 to 6 months or sooner if needed.  Chronic inflammatory arthritis - RF negative: She has no synovitis on examination today.  She has synovial thickening of bilateral 1st through 3rd MCP joints but no active inflammation was noted.  No effusion in the knee joints noted today.  Her symptoms are currently well-controlled taking Plaquenil  200 mg 1 tablet by mouth daily.  She was advised to notify us  if she develops any new or worsening symptoms.  No medication changes will be made at this time.  High risk medication use - Plaquenil  200 mg 1 tablet by mouth daily. PLQ Eye Exam: 04/23/2023 WNL @ [facility] follow up in  1 year. Patient was given a new plaquenil  eye examination.  CBC and CMP updated on 04/01/24.   Discoid lupus: No recurrence.  Discussed the importance of avoiding direct UV exposure.   Anticardiolipin antibody positive - History of DVT and PE- Patient remains on warfarin as prescribed.  Malignant neoplasm of lower-outer quadrant of right breast of female, estrogen receptor positive (HCC): Dr. Lee Public.  Patient had an updated PET/CT on 06/02/2023 which revealed interval progression of hepatic metastasis as well as progression of bone metastasis. Under care of Dr. Merrill Abide office visit note from 10/21/2023--CA 27-29 showing response-1933 on 10/14/23.  Evaluated by Dr. Gudena on 04/01/24.  Nausea, fatigue, and oral thrush secondary to chemotherapy.  Metastatic disease--Enhertu  cycle 1 day 1 on 03/23/24.   Raynaud's disease without gangrene: She continues have intermittent symptoms of Raynaud's phenomenon.  No signs of sclerodactyly noted.  No digital ulcerations noted.  Primary osteoarthritis of both hands: She has PIP and DIP thickening consistent with osteoarthritis.  No synovitis noted on examination today.  Primary osteoarthritis of both knees: No  warmth or effusion noted today.   Other medical conditions are listed as follows:   Primary insomnia  Vitamin D  deficiency: She is taking vitamin D  3000 units daily.  Other osteoporosis without current pathological fracture  History of pulmonary embolism  History of DVT (deep vein thrombosis)  History of IBS  History of anxiety  Port-A-Cath in place  Family history of hemochromatosis  Orders: Orders Placed This Encounter  Procedures   C3 and C4   Sedimentation rate   Protein / creatinine ratio, urine   Anti-DNA antibody, double-stranded   ANA   Meds ordered this encounter  Medications   amphetamine -dextroamphetamine (ADDERALL XR) 20 MG 24 hr capsule    Sig: Take 1 capsule (20 mg total) by mouth daily.    Dispense:  30 capsule    Refill:  0   hydroxychloroquine  (PLAQUENIL ) 200 MG tablet    Sig: TAKE 1 TABLET(200 MG) BY MOUTH DAILY    Dispense:  90 tablet    Refill:  0    Follow-Up Instructions: Return in about 6 months (around 10/09/2024) for Systemic lupus erythematosus.   Romayne Clubs, PA-C  Note - This record has been created using Dragon software.  Chart creation errors have been sought, but may not always  have been located. Such creation errors do not reflect on  the standard of medical care.

## 2024-03-25 NOTE — Telephone Encounter (Signed)
-----   Message from Nurse Rexene Catching sent at 03/24/2024  1:38 PM EDT ----- Regarding: FT Chemo- Gudena First time enhertu. Completed treatment.

## 2024-03-29 ENCOUNTER — Other Ambulatory Visit: Payer: Self-pay

## 2024-03-30 ENCOUNTER — Ambulatory Visit
Admission: RE | Admit: 2024-03-30 | Discharge: 2024-03-30 | Disposition: A | Discharge status: Home / Self Care | Source: Ambulatory Visit | Attending: Hematology and Oncology | Admitting: Hematology and Oncology

## 2024-03-30 ENCOUNTER — Other Ambulatory Visit: Payer: Self-pay

## 2024-03-30 DIAGNOSIS — C50511 Malignant neoplasm of lower-outer quadrant of right female breast: Secondary | ICD-10-CM

## 2024-03-30 HISTORY — PX: BREAST BIOPSY: SHX20

## 2024-03-31 LAB — SURGICAL PATHOLOGY

## 2024-04-01 ENCOUNTER — Inpatient Hospital Stay (HOSPITAL_BASED_OUTPATIENT_CLINIC_OR_DEPARTMENT_OTHER): Payer: 59 | Admitting: Hematology and Oncology

## 2024-04-01 ENCOUNTER — Inpatient Hospital Stay

## 2024-04-01 VITALS — BP 115/52 | HR 66 | Temp 98.0°F | Resp 16 | Ht 64.0 in | Wt 114.9 lb

## 2024-04-01 DIAGNOSIS — C50511 Malignant neoplasm of lower-outer quadrant of right female breast: Secondary | ICD-10-CM

## 2024-04-01 DIAGNOSIS — Z170 Estrogen receptor positive status [ER+]: Secondary | ICD-10-CM

## 2024-04-01 DIAGNOSIS — Z5112 Encounter for antineoplastic immunotherapy: Secondary | ICD-10-CM | POA: Diagnosis not present

## 2024-04-01 DIAGNOSIS — C787 Secondary malignant neoplasm of liver and intrahepatic bile duct: Secondary | ICD-10-CM

## 2024-04-01 LAB — CBC WITH DIFFERENTIAL (CANCER CENTER ONLY)
Abs Immature Granulocytes: 0.01 10*3/uL (ref 0.00–0.07)
Basophils Absolute: 0 10*3/uL (ref 0.0–0.1)
Basophils Relative: 1 %
Eosinophils Absolute: 0.1 10*3/uL (ref 0.0–0.5)
Eosinophils Relative: 1 %
HCT: 36.8 % (ref 36.0–46.0)
Hemoglobin: 12.7 g/dL (ref 12.0–15.0)
Immature Granulocytes: 0 %
Lymphocytes Relative: 29 %
Lymphs Abs: 1.4 10*3/uL (ref 0.7–4.0)
MCH: 34.1 pg — ABNORMAL HIGH (ref 26.0–34.0)
MCHC: 34.5 g/dL (ref 30.0–36.0)
MCV: 98.9 fL (ref 80.0–100.0)
Monocytes Absolute: 0.3 10*3/uL (ref 0.1–1.0)
Monocytes Relative: 6 %
Neutro Abs: 2.9 10*3/uL (ref 1.7–7.7)
Neutrophils Relative %: 63 %
Platelet Count: 97 10*3/uL — ABNORMAL LOW (ref 150–400)
RBC: 3.72 MIL/uL — ABNORMAL LOW (ref 3.87–5.11)
RDW: 13.8 % (ref 11.5–15.5)
WBC Count: 4.7 10*3/uL (ref 4.0–10.5)
nRBC: 0 % (ref 0.0–0.2)

## 2024-04-01 LAB — CMP (CANCER CENTER ONLY)
ALT: 35 U/L (ref 0–44)
AST: 50 U/L — ABNORMAL HIGH (ref 15–41)
Albumin: 4.2 g/dL (ref 3.5–5.0)
Alkaline Phosphatase: 90 U/L (ref 38–126)
Anion gap: 4 — ABNORMAL LOW (ref 5–15)
BUN: 16 mg/dL (ref 6–20)
CO2: 31 mmol/L (ref 22–32)
Calcium: 9 mg/dL (ref 8.9–10.3)
Chloride: 100 mmol/L (ref 98–111)
Creatinine: 0.98 mg/dL (ref 0.44–1.00)
GFR, Estimated: >60 mL/min (ref >60)
Glucose, Bld: 91 mg/dL (ref 70–99)
Potassium: 4.1 mmol/L (ref 3.5–5.1)
Sodium: 135 mmol/L (ref 135–145)
Total Bilirubin: 0.6 mg/dL (ref 0.0–1.2)
Total Protein: 6.8 g/dL (ref 6.5–8.1)

## 2024-04-01 MED ORDER — FLUCONAZOLE 100 MG PO TABS: 100.0000 mg | ORAL_TABLET | Freq: Every day | ORAL | 0 refills | Status: AC

## 2024-04-01 NOTE — Assessment & Plan Note (Signed)
 08/04/2020: Right breast biopsy T2 N0 M1 stage IV IDC grade 3, triple positive with a Ki-67 of 40%, multiple liver lesions biopsy was positive for breast cancer ER/PR positive HER2 negative with a Ki-67 30% 08/31/2020-12/14/2020: TCHP x6 01/29/2021: Letrozole  and palbociclib    11/29/2022-06/05/2023: Olaparib  07/13/2023-03/24/2023: Xeloda      10/24/2022: CT CAP: Multiple new hepatic lesions measuring up to 1.5 cm consistent with metastatic disease, stable 5 mm nodule left costophrenic sulcus 12/05/2021: Guardant360: ATM gene mutation (possible treatment option olaparib ), MSI high: Not detected.  Will refer the patient to genetic testing   Lab review: CA 27-29: 12/19/2023: 1650   PET CT scan 03/03/2023: Widespread hypermetabolic metastatic disease within the liver is similar to previous PET/CT 06/02/2023: Interval progression of hepatic metastases.  Progression of bone metastases.  Suspicion of peritoneal disease.   06/13/2023: Guardant360: ATM, T p53, FGF R1 amplification, TMB 4.73 mutations ---------------------------------------------------------------------------------------------------------------------------- Current treatment: Enhertu  cycle 1 day 8 Chemo toxicities: Echocardiogram 03/24/2024: EF 55 to 60%  Return to clinic in 2 weeks for cycle 2

## 2024-04-01 NOTE — Progress Notes (Signed)
 Patient Care Team: Rey Catholic, MD as PCP - General (Family Medicine) Jeri Mons, Nash Bade, MD as Referring Physician (Oncology) Alane Hsu, RN as Oncology Nurse Navigator Auther Bo, RN as Oncology Nurse Navigator Enid Harry, MD as Consulting Physician (General Surgery) Johna Myers, MD as Consulting Physician (Radiation Oncology) Nicholas Bari, MD as Consulting Physician (Rheumatology) Hamp Levine, MD as Consulting Physician (Obstetrics and Gynecology) Darlis Eisenmenger, MD as Consulting Physician (Cardiology) Armbruster, Lendon Queen, MD as Consulting Physician (Gastroenterology) Marvis Sluder, MD as Referring Physician (Ophthalmology) Marvis Sluder, MD as Referring Physician (Ophthalmology) Cameron Cea, MD as Consulting Physician (Hematology and Oncology) Althea Atkinson, RPH-CPP as Pharmacist (Hematology and Oncology)  DIAGNOSIS:  Encounter Diagnosis  Name Primary?   Malignant neoplasm of lower-outer quadrant of right breast of female, estrogen receptor positive (HCC) Yes    SUMMARY OF ONCOLOGIC HISTORY: Oncology History  Malignant neoplasm of lower-outer quadrant of right breast of female, estrogen receptor positive (HCC)  08/04/2020 Initial Diagnosis   Right breast lower outer quadrant biopsy 08/04/2020 for a clinical T2 N0 M1, stage IV invasive ductal carcinoma, grade 3, triple positive, with an MIB-1 of 40%. abdominal MRI 08/24/2020 shows multiple liver lesions highly suspicious for liver metastases. Liver biopsy 09/20/2020 positive for carcinoma, prognostic panel estrogen and progesterone receptor positive, but HER-2 negative (0); MIB-1 of 30%   08/31/2020 - 12/14/2020 Chemotherapy   Neoadjuvant chemotherapy consisting of docetaxel , carboplatin , trastuzumab  and pertuzumab  every 21 days x 6    12/26/2020 Imaging   breast MRI 12/26/2020 showed right breast index lesion decreased to 0.8 cm; 2 additional masses in the right breast  stable abdominal MRI 01/29/2021 shows no active disease in the liver    Chemotherapy   Herceptin  Maintenance initially q 3 weeks, now q 4 weeks   01/29/2021 -  Anti-estrogen oral therapy   anastrozole  and palbociclib     01/29/2023 Genetic Testing   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  Report date is 01/29/2023.   The Multi-Cancer + RNA Panel offered by [facility] includes sequencing and/or deletion/duplication analysis of the following 70 genes:  AIP*, ALK, APC*, ATM*, AXIN2*, BAP1*, BARD1*, BLM*, BMPR1A*, BRCA1*, BRCA2*, BRIP1*, CDC73*, CDH1*, CDK4, CDKN1B*, CDKN2A, CHEK2*, CTNNA1*, DICER1*, EPCAM (del/dup only), EGFR, FH*, FLCN*, GREM1 (promoter dup only), HOXB13, KIT, LZTR1, MAX*, MBD4, MEN1*, MET, MITF, MLH1*, MSH2*, MSH3*, MSH6*, MUTYH*, NF1*, NF2*, NTHL1*, PALB2*, PDGFRA, PMS2*, POLD1*, POLE*, POT1*, PRKAR1A*, PTCH1*, PTEN*, RAD51C*, RAD51D*, RB1*, RET, SDHA* (sequencing only), SDHAF2*, SDHB*, SDHC*, SDHD*, SMAD4*, SMARCA4*, SMARCB1*, SMARCE1*, STK11*, SUFU*, TMEM127*, TP53*, TSC1*, TSC2*, VHL*. RNA analysis is performed for * genes.   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  Report date is 01/29/2023.   The Multi-Cancer + RNA Panel offered by [facility] includes sequencing and/or deletion/duplication analysis of the following 70 genes:  AIP*, ALK, APC*, ATM*, AXIN2*, BAP1*, BARD1*, BLM*, BMPR1A*, BRCA1*, BRCA2*, BRIP1*, CDC73*, CDH1*, CDK4, CDKN1B*, CDKN2A, CHEK2*, CTNNA1*, DICER1*, EPCAM (del/dup only), EGFR, FH*, FLCN*, GREM1 (promoter dup only), HOXB13, KIT, LZTR1, MAX*, MBD4, MEN1*, MET, MITF,  MLH1*, MSH2*, MSH3*, MSH6*, MUTYH*, NF1*, NF2*, NTHL1*, PALB2*, PDGFRA, PMS2*, POLD1*, POLE*,  POT1*, PRKAR1A*, PTCH1*, PTEN*, RAD51C*, RAD51D*, RB1*, RET, SDHA* (sequencing only), SDHAF2*, SDHB*, SDHC*, SDHD*, SMAD4*, SMARCA4*, SMARCB1*, SMARCE1*, STK11*, SUFU*, TMEM127*, TP53*, TSC1*, TSC2*, VHL*. RNA analysis is performed for * genes.     CHIEF COMPLIANT:   HISTORY OF PRESENT ILLNESS:   History of Present Illness Erica Wood is a 58 year old female with metastatic breast cancer undergoing chemotherapy who presents with nausea and throat discomfort.  She is receiving high-dose chemotherapy for estrogen receptor-positive breast cancer with liver metastases. Her treatment response is monitored through blood counts and imaging. A recent liver biopsy resulted in some bleeding, which was managed by nursing staff.  She experiences significant nausea, particularly in the morning, leading to vomiting. Morning flushing improves by midday after medication. She takes famotidine at night to manage these symptoms.  Throat discomfort is described as a sensation of 'something with my throat.' Eating and bowel movements have improved with a plan to eat six small meals a day.  Blood counts, including white blood cell count, hemoglobin, and platelets, are within acceptable ranges. She experiences expected chemotherapy side effects, such as nausea and fatigue.     ALLERGIES:  is allergic to beef-derived drug products, latex, sulfa antibiotics, and tape.  MEDICATIONS:  Current Outpatient Medications  Medication Sig Dispense Refill   acetaminophen  (TYLENOL ) 500 MG tablet Take 1,000 mg by mouth every 6 (six) hours as needed for moderate pain or headache.     amphetamine -dextroamphetamine (ADDERALL XR) 20 MG 24 hr capsule Take 1 capsule (20 mg total) by mouth daily. 30 capsule 0   Ascorbic Acid (VITAMIN C PO) Take 1,000 mg by mouth daily.     capecitabine  (XELODA ) 500 MG tablet Take 2 tablets (1,000  mg total) by mouth 2 (two) times daily after a meal. 14 days on 7 days off 56 tablet 6   Cholecalciferol (VITAMIN D3) 75 MCG (3000 UT) TABS Take 3,000 Units by mouth daily.     Clindamycin-Benzoyl Per, Refr, gel Apply topically.     clobetasol  cream (TEMOVATE ) 0.05 % Apply 1 Application topically 2 (two) times daily. Apply to affected area. 30 g 0   diclofenac  Sodium (VOLTAREN ) 1 % GEL Research Patient: Apply 0.5 grams (1 fingertip) to each hand and each foot twice daily for up to 12 weeks 400 g 0   estradiol  (ESTRACE ) 0.1 MG/GM vaginal cream INSERT 1 APPLICATORFUL VAGINALLY 3 TIMES A WEEK 42.5 g 12   famotidine (PEPCID) 20 MG tablet Take 20 mg by mouth at bedtime.     hydroxychloroquine  (PLAQUENIL ) 200 MG tablet TAKE 1 TABLET(200 MG) BY MOUTH DAILY 90 tablet 0   lidocaine -prilocaine  (EMLA ) cream Apply topically.     lidocaine -prilocaine  (EMLA ) cream Apply to affected area once 30 g 3   Magnesium  Citrate 100 MG TABS Take 1 tablet by mouth daily as needed. 30 tablet 3   ondansetron  (ZOFRAN ) 8 MG tablet Take 1 tablet (8 mg total) by mouth every 8 (eight) hours as needed for nausea or vomiting. 30 tablet 6   ondansetron  (ZOFRAN ) 8 MG tablet Take 1 tablet (8 mg total) by mouth every 8 (eight) hours as needed for nausea or vomiting. Start on the third day after chemotherapy. 30 tablet 1   oxyCODONE -acetaminophen  (PERCOCET/ROXICET) 5-325 MG tablet Take 1 tablet by mouth every 8 (eight) hours as needed for severe pain (pain score 7-10). 30 tablet 0   prochlorperazine  (COMPAZINE ) 10 MG tablet Take 1 tablet (10 mg total) by mouth every 6 (six) hours as needed  for nausea or vomiting. 30 tablet 1   spironolactone (ALDACTONE) 25 MG tablet Take 25 mg by mouth daily as needed (swelling).     traMADol  (ULTRAM ) 50 MG tablet Take 1-2 tablets (50-100 mg total) by mouth every 6 (six) hours as needed. 60 tablet 0   tretinoin (RETIN-A) 0.1 % cream Apply 1 application  topically at bedtime as needed (acne).      triamterene -hydrochlorothiazide (DYAZIDE) 37.5-25 MG capsule TAKE 1 CAPSULE BY MOUTH DAILY 90 capsule 1   warfarin (COUMADIN ) 5 MG tablet TAKE 1 TABLET(5 MG) BY MOUTH DAILY 30 tablet PRN   No current facility-administered medications for this visit.    PHYSICAL EXAMINATION: ECOG PERFORMANCE STATUS: 1 - Symptomatic but completely ambulatory  Vitals:   04/01/24 1131  BP: (!) 115/52  Pulse: 66  Resp: 16  Temp: 98 F (36.7 C)  SpO2: 100%   Filed Weights   04/01/24 1131  Weight: 114 lb 14.4 oz (52.1 kg)    Physical Exam HEENT: Thrush on tonsillar pillars with white flakes.  (exam performed in the presence of a chaperone)  LABORATORY DATA:  I have reviewed the data as listed    Latest Ref Rng & Units 03/23/2024    2:43 PM 02/05/2024   11:04 AM 12/19/2023   10:19 AM  CMP  Glucose 70 - 99 mg/dL 161  096  83   BUN 6 - 20 mg/dL 17  12  14    Creatinine 0.44 - 1.00 mg/dL 0.45  4.09  8.11   Sodium 135 - 145 mmol/L 135  137  137   Potassium 3.5 - 5.1 mmol/L 4.1  4.1  4.1   Chloride 98 - 111 mmol/L 100  104  104   CO2 22 - 32 mmol/L 31  30  30    Calcium 8.9 - 10.3 mg/dL 8.9  8.8  8.9   Total Protein 6.5 - 8.1 g/dL 6.5  6.6  6.8   Total Bilirubin 0.0 - 1.2 mg/dL 0.6  0.5  0.6   Alkaline Phos 38 - 126 U/L 89  81  72   AST 15 - 41 U/L 27  33  31   ALT 0 - 44 U/L 15  21  24      Lab Results  Component Value Date   WBC 4.7 04/01/2024   HGB 12.7 04/01/2024   HCT 36.8 04/01/2024   MCV 98.9 04/01/2024   PLT 97 (L) 04/01/2024   NEUTROABS 2.9 04/01/2024    ASSESSMENT & PLAN:  Malignant neoplasm of lower-outer quadrant of right breast of female, estrogen receptor positive (HCC) 08/04/2020: Right breast biopsy T2 N0 M1 stage IV IDC grade 3, triple positive with a Ki-67 of 40%, multiple liver lesions biopsy was positive for breast cancer ER/PR positive HER2 negative with a Ki-67 30% 08/31/2020-12/14/2020: TCHP x6 01/29/2021: Letrozole  and palbociclib    11/29/2022-06/05/2023:  Olaparib  07/13/2023-03/24/2023: Xeloda      10/24/2022: CT CAP: Multiple new hepatic lesions measuring up to 1.5 cm consistent with metastatic disease, stable 5 mm nodule left costophrenic sulcus 12/05/2021: Guardant360: ATM gene mutation (possible treatment option olaparib ), MSI high: Not detected.  Will refer the patient to genetic testing   Lab review: CA 27-29: 12/19/2023: 1650   PET CT scan 03/03/2023: Widespread hypermetabolic metastatic disease within the liver is similar to previous PET/CT 06/02/2023: Interval progression of hepatic metastases.  Progression of bone metastases.  Suspicion of peritoneal disease.   06/13/2023: Guardant360: ATM, T p53, FGF R1 amplification, TMB 4.73 mutations ---------------------------------------------------------------------------------------------------------------------------- Current  treatment: Enhertu  cycle 1 day 8 Chemo toxicities: Nausea: advised AM Zofran  and Pepcid Fatigue Thrush: sent diflucan  Echocardiogram 03/24/2024: EF 55 to 60%  Return to clinic in 2 weeks for cycle 2  ------------------------------------- Assessment and Plan Assessment & Plan Nausea due to chemotherapy Nausea attributed to chemotherapy, more pronounced in the morning. Famotidine may be more effective if taken in the morning. Blood counts show good treatment tolerance. - Administer famotidine twice daily, morning and night. - Take antiemetic in the morning as a preventive measure. - Consume small, frequent meals six times daily.  Oral thrush White flakes on tonsillar pillars indicate oral thrush, likely exacerbated by chemotherapy. - Prescribe antifungal medication once daily for five days. - Send prescription to [facility] on [address] at [employer].      No orders of the defined types were placed in this encounter.  The patient has a good understanding of the overall plan. she agrees with it. she will call with any problems that may develop before the next visit  here. Total time spent: 30 mins including face to face time and time spent for planning, charting and co-ordination of care   Viinay K Ryver Zadrozny, MD 04/01/24

## 2024-04-02 LAB — CANCER ANTIGEN 27.29: CA 27.29: 3507.1 U/mL — ABNORMAL HIGH (ref 0.0–38.6)

## 2024-04-06 ENCOUNTER — Telehealth: Payer: Self-pay

## 2024-04-06 NOTE — Telephone Encounter (Signed)
 Notified the pt regarding her completed Disability forms being completed,faxed, and confirmation received. Pt state that she will picked her hardcopy up when she comes to her appointment next week. No questions or  concerns at this time.

## 2024-04-08 ENCOUNTER — Encounter: Payer: Self-pay | Admitting: Physician Assistant

## 2024-04-08 ENCOUNTER — Other Ambulatory Visit: Payer: Self-pay | Admitting: Hematology and Oncology

## 2024-04-08 ENCOUNTER — Ambulatory Visit: Payer: 59 | Attending: Physician Assistant | Admitting: Physician Assistant

## 2024-04-08 VITALS — BP 106/68 | HR 65 | Resp 13 | Ht 64.0 in | Wt 115.0 lb

## 2024-04-08 DIAGNOSIS — I7300 Raynaud's syndrome without gangrene: Secondary | ICD-10-CM

## 2024-04-08 DIAGNOSIS — L930 Discoid lupus erythematosus: Secondary | ICD-10-CM

## 2024-04-08 DIAGNOSIS — Z95828 Presence of other vascular implants and grafts: Secondary | ICD-10-CM

## 2024-04-08 DIAGNOSIS — M19041 Primary osteoarthritis, right hand: Secondary | ICD-10-CM

## 2024-04-08 DIAGNOSIS — Z8349 Family history of other endocrine, nutritional and metabolic diseases: Secondary | ICD-10-CM

## 2024-04-08 DIAGNOSIS — Z8719 Personal history of other diseases of the digestive system: Secondary | ICD-10-CM

## 2024-04-08 DIAGNOSIS — Z79899 Other long term (current) drug therapy: Secondary | ICD-10-CM | POA: Diagnosis not present

## 2024-04-08 DIAGNOSIS — E559 Vitamin D deficiency, unspecified: Secondary | ICD-10-CM

## 2024-04-08 DIAGNOSIS — Z86711 Personal history of pulmonary embolism: Secondary | ICD-10-CM

## 2024-04-08 DIAGNOSIS — M1990 Unspecified osteoarthritis, unspecified site: Secondary | ICD-10-CM | POA: Diagnosis not present

## 2024-04-08 DIAGNOSIS — C50511 Malignant neoplasm of lower-outer quadrant of right female breast: Secondary | ICD-10-CM

## 2024-04-08 DIAGNOSIS — M19042 Primary osteoarthritis, left hand: Secondary | ICD-10-CM

## 2024-04-08 DIAGNOSIS — M170 Bilateral primary osteoarthritis of knee: Secondary | ICD-10-CM

## 2024-04-08 DIAGNOSIS — R760 Raised antibody titer: Secondary | ICD-10-CM | POA: Diagnosis not present

## 2024-04-08 DIAGNOSIS — Z170 Estrogen receptor positive status [ER+]: Secondary | ICD-10-CM

## 2024-04-08 DIAGNOSIS — M329 Systemic lupus erythematosus, unspecified: Secondary | ICD-10-CM

## 2024-04-08 DIAGNOSIS — F5101 Primary insomnia: Secondary | ICD-10-CM

## 2024-04-08 DIAGNOSIS — Z86718 Personal history of other venous thrombosis and embolism: Secondary | ICD-10-CM

## 2024-04-08 DIAGNOSIS — Z8659 Personal history of other mental and behavioral disorders: Secondary | ICD-10-CM

## 2024-04-08 DIAGNOSIS — M818 Other osteoporosis without current pathological fracture: Secondary | ICD-10-CM

## 2024-04-08 MED ORDER — AMPHETAMINE-DEXTROAMPHET ER 20 MG PO CP24: 20.0000 mg | ORAL_CAPSULE | Freq: Every day | ORAL | 0 refills | Status: DC

## 2024-04-08 MED ORDER — HYDROXYCHLOROQUINE SULFATE 200 MG PO TABS: ORAL_TABLET | ORAL | 0 refills | Status: DC

## 2024-04-09 ENCOUNTER — Other Ambulatory Visit: Payer: Self-pay

## 2024-04-09 MED ORDER — AMPHETAMINE-DEXTROAMPHET ER 20 MG PO CP24: 20.0000 mg | ORAL_CAPSULE | Freq: Every day | ORAL | 0 refills | Status: DC

## 2024-04-09 NOTE — Telephone Encounter (Signed)
 Patient tried to pick up medication from [facility] but was advised they did not have the medication. Patient asked if it could be sent to [facility] in [place]

## 2024-04-13 MED FILL — Fosaprepitant Dimeglumine For IV Infusion 150 MG (Base Eq): INTRAVENOUS | Qty: 5 | Status: AC

## 2024-04-14 ENCOUNTER — Inpatient Hospital Stay

## 2024-04-14 ENCOUNTER — Inpatient Hospital Stay: Attending: Hematology and Oncology

## 2024-04-14 ENCOUNTER — Inpatient Hospital Stay (HOSPITAL_BASED_OUTPATIENT_CLINIC_OR_DEPARTMENT_OTHER): Admitting: Hematology and Oncology

## 2024-04-14 ENCOUNTER — Encounter: Payer: Self-pay | Admitting: Hematology and Oncology

## 2024-04-14 VITALS — BP 116/64 | HR 62 | Temp 98.2°F | Resp 17 | Wt 114.2 lb

## 2024-04-14 DIAGNOSIS — C7951 Secondary malignant neoplasm of bone: Secondary | ICD-10-CM | POA: Insufficient documentation

## 2024-04-14 DIAGNOSIS — Z5112 Encounter for antineoplastic immunotherapy: Secondary | ICD-10-CM | POA: Insufficient documentation

## 2024-04-14 DIAGNOSIS — Z95828 Presence of other vascular implants and grafts: Secondary | ICD-10-CM

## 2024-04-14 DIAGNOSIS — C50511 Malignant neoplasm of lower-outer quadrant of right female breast: Secondary | ICD-10-CM

## 2024-04-14 DIAGNOSIS — Z79899 Other long term (current) drug therapy: Secondary | ICD-10-CM | POA: Insufficient documentation

## 2024-04-14 DIAGNOSIS — Z1721 Progesterone receptor positive status: Secondary | ICD-10-CM | POA: Insufficient documentation

## 2024-04-14 DIAGNOSIS — Z170 Estrogen receptor positive status [ER+]: Secondary | ICD-10-CM | POA: Insufficient documentation

## 2024-04-14 DIAGNOSIS — C787 Secondary malignant neoplasm of liver and intrahepatic bile duct: Secondary | ICD-10-CM

## 2024-04-14 DIAGNOSIS — Z1732 Human epidermal growth factor receptor 2 negative status: Secondary | ICD-10-CM | POA: Insufficient documentation

## 2024-04-14 LAB — CMP (CANCER CENTER ONLY)
ALT: 28 U/L (ref 0–44)
AST: 36 U/L (ref 15–41)
Albumin: 4 g/dL (ref 3.5–5.0)
Alkaline Phosphatase: 87 U/L (ref 38–126)
Anion gap: 4 — ABNORMAL LOW (ref 5–15)
BUN: 16 mg/dL (ref 6–20)
CO2: 29 mmol/L (ref 22–32)
Calcium: 8.9 mg/dL (ref 8.9–10.3)
Chloride: 101 mmol/L (ref 98–111)
Creatinine: 0.87 mg/dL (ref 0.44–1.00)
GFR, Estimated: >60 mL/min (ref >60)
Glucose, Bld: 114 mg/dL — ABNORMAL HIGH (ref 70–99)
Potassium: 4 mmol/L (ref 3.5–5.1)
Sodium: 134 mmol/L — ABNORMAL LOW (ref 135–145)
Total Bilirubin: 0.5 mg/dL (ref 0.0–1.2)
Total Protein: 6.6 g/dL (ref 6.5–8.1)

## 2024-04-14 LAB — CBC WITH DIFFERENTIAL (CANCER CENTER ONLY)
Abs Immature Granulocytes: 0 10*3/uL (ref 0.00–0.07)
Basophils Absolute: 0.1 10*3/uL (ref 0.0–0.1)
Basophils Relative: 2 %
Eosinophils Absolute: 0 10*3/uL (ref 0.0–0.5)
Eosinophils Relative: 1 %
HCT: 34.4 % — ABNORMAL LOW (ref 36.0–46.0)
Hemoglobin: 11.9 g/dL — ABNORMAL LOW (ref 12.0–15.0)
Immature Granulocytes: 0 %
Lymphocytes Relative: 39 %
Lymphs Abs: 1.5 10*3/uL (ref 0.7–4.0)
MCH: 34.1 pg — ABNORMAL HIGH (ref 26.0–34.0)
MCHC: 34.6 g/dL (ref 30.0–36.0)
MCV: 98.6 fL (ref 80.0–100.0)
Monocytes Absolute: 0.4 10*3/uL (ref 0.1–1.0)
Monocytes Relative: 10 %
Neutro Abs: 1.9 10*3/uL (ref 1.7–7.7)
Neutrophils Relative %: 48 %
Platelet Count: 195 10*3/uL (ref 150–400)
RBC: 3.49 MIL/uL — ABNORMAL LOW (ref 3.87–5.11)
RDW: 14.3 % (ref 11.5–15.5)
WBC Count: 3.9 10*3/uL — ABNORMAL LOW (ref 4.0–10.5)
nRBC: 0 % (ref 0.0–0.2)

## 2024-04-14 MED ORDER — DEXTROSE 5 % IV SOLN: INTRAVENOUS | Status: DC

## 2024-04-14 MED ORDER — ACETAMINOPHEN 325 MG PO TABS
650.0000 mg | ORAL_TABLET | Freq: Once | ORAL | Status: AC
  Administered 2024-04-14: 650 mg via ORAL
  Filled 2024-04-14: qty 2

## 2024-04-14 MED ORDER — SODIUM CHLORIDE 0.9% FLUSH
10.0000 mL | Freq: Once | INTRAVENOUS | Status: AC
  Administered 2024-04-14: 10 mL

## 2024-04-14 MED ORDER — SODIUM CHLORIDE 0.9 % IV SOLN
150.0000 mg | Freq: Once | INTRAVENOUS | Status: AC
  Administered 2024-04-14: 150 mg via INTRAVENOUS
  Filled 2024-04-14: qty 150

## 2024-04-14 MED ORDER — DIPHENHYDRAMINE HCL 25 MG PO CAPS
25.0000 mg | ORAL_CAPSULE | Freq: Once | ORAL | Status: AC
  Administered 2024-04-14: 25 mg via ORAL
  Filled 2024-04-14: qty 1

## 2024-04-14 MED ORDER — SODIUM CHLORIDE 0.9% FLUSH
10.0000 mL | INTRAVENOUS | Status: DC | PRN
  Administered 2024-04-14: 10 mL

## 2024-04-14 MED ORDER — PALONOSETRON HCL INJECTION 0.25 MG/5ML
0.2500 mg | Freq: Once | INTRAVENOUS | Status: AC
  Administered 2024-04-14: 0.25 mg via INTRAVENOUS
  Filled 2024-04-14: qty 5

## 2024-04-14 MED ORDER — HEPARIN SOD (PORK) LOCK FLUSH 100 UNIT/ML IV SOLN
500.0000 [IU] | Freq: Once | INTRAVENOUS | Status: AC | PRN
  Administered 2024-04-14: 500 [IU]

## 2024-04-14 MED ORDER — FAM-TRASTUZUMAB DERUXTECAN-NXKI CHEMO 100 MG IV SOLR
4.4000 mg/kg | Freq: Once | INTRAVENOUS | Status: AC
  Administered 2024-04-14: 240 mg via INTRAVENOUS
  Filled 2024-04-14: qty 12

## 2024-04-14 NOTE — Progress Notes (Signed)
 Patient Care Team: Rey Catholic, MD as PCP - General (Family Medicine) Jeri Mons, Nash Bade, MD as Referring Physician (Oncology) Alane Hsu, RN as Oncology Nurse Navigator Auther Bo, RN as Oncology Nurse Navigator Enid Harry, MD as Consulting Physician (General Surgery) Johna Myers, MD as Consulting Physician (Radiation Oncology) Nicholas Bari, MD as Consulting Physician (Rheumatology) Hamp Levine, MD as Consulting Physician (Obstetrics and Gynecology) Darlis Eisenmenger, MD as Consulting Physician (Cardiology) Armbruster, Lendon Queen, MD as Consulting Physician (Gastroenterology) Marvis Sluder, MD as Referring Physician (Ophthalmology) Marvis Sluder, MD as Referring Physician (Ophthalmology) Cameron Cea, MD as Consulting Physician (Hematology and Oncology) Althea Atkinson, RPH-CPP as Pharmacist (Hematology and Oncology)  DIAGNOSIS:  Encounter Diagnosis  Name Primary?   Malignant neoplasm of lower-outer quadrant of right breast of female, estrogen receptor positive (HCC) Yes    SUMMARY OF ONCOLOGIC HISTORY: Oncology History  Malignant neoplasm of lower-outer quadrant of right breast of female, estrogen receptor positive (HCC)  08/04/2020 Initial Diagnosis   Right breast lower outer quadrant biopsy 08/04/2020 for a clinical T2 N0 M1, stage IV invasive ductal carcinoma, grade 3, triple positive, with an MIB-1 of 40%. abdominal MRI 08/24/2020 shows multiple liver lesions highly suspicious for liver metastases. Liver biopsy 09/20/2020 positive for carcinoma, prognostic panel estrogen and progesterone receptor positive, but HER-2 negative (0); MIB-1 of 30%   08/31/2020 - 12/14/2020 Chemotherapy   Neoadjuvant chemotherapy consisting of docetaxel , carboplatin , trastuzumab  and pertuzumab  every 21 days x 6    12/26/2020 Imaging   breast MRI 12/26/2020 showed right breast index lesion decreased to 0.8 cm; 2 additional masses in the right breast  stable abdominal MRI 01/29/2021 shows no active disease in the liver    Chemotherapy   Herceptin  Maintenance initially q 3 weeks, now q 4 weeks   01/29/2021 -  Anti-estrogen oral therapy   anastrozole  and palbociclib     01/29/2023 Genetic Testing   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  Report date is 01/29/2023.   The Multi-Cancer + RNA Panel offered by [facility] includes sequencing and/or deletion/duplication analysis of the following 70 genes:  AIP*, ALK, APC*, ATM*, AXIN2*, BAP1*, BARD1*, BLM*, BMPR1A*, BRCA1*, BRCA2*, BRIP1*, CDC73*, CDH1*, CDK4, CDKN1B*, CDKN2A, CHEK2*, CTNNA1*, DICER1*, EPCAM (del/dup only), EGFR, FH*, FLCN*, GREM1 (promoter dup only), HOXB13, KIT, LZTR1, MAX*, MBD4, MEN1*, MET, MITF, MLH1*, MSH2*, MSH3*, MSH6*, MUTYH*, NF1*, NF2*, NTHL1*, PALB2*, PDGFRA, PMS2*, POLD1*, POLE*, POT1*, PRKAR1A*, PTCH1*, PTEN*, RAD51C*, RAD51D*, RB1*, RET, SDHA* (sequencing only), SDHAF2*, SDHB*, SDHC*, SDHD*, SMAD4*, SMARCA4*, SMARCB1*, SMARCE1*, STK11*, SUFU*, TMEM127*, TP53*, TSC1*, TSC2*, VHL*. RNA analysis is performed for * genes.   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  Report date is 01/29/2023.   The Multi-Cancer + RNA Panel offered by [facility] includes sequencing and/or deletion/duplication analysis of the following 70 genes:  AIP*, ALK, APC*, ATM*, AXIN2*, BAP1*, BARD1*, BLM*, BMPR1A*, BRCA1*, BRCA2*, BRIP1*, CDC73*, CDH1*, CDK4, CDKN1B*, CDKN2A, CHEK2*, CTNNA1*, DICER1*, EPCAM (del/dup only), EGFR, FH*, FLCN*, GREM1 (promoter dup only), HOXB13, KIT, LZTR1, MAX*, MBD4, MEN1*, MET, MITF,  MLH1*, MSH2*, MSH3*, MSH6*, MUTYH*, NF1*, NF2*, NTHL1*, PALB2*, PDGFRA, PMS2*, POLD1*, POLE*,  POT1*, PRKAR1A*, PTCH1*, PTEN*, RAD51C*, RAD51D*, RB1*, RET, SDHA* (sequencing only), SDHAF2*, SDHB*, SDHC*, SDHD*, SMAD4*, SMARCA4*, SMARCB1*, SMARCE1*, STK11*, SUFU*, TMEM127*, TP53*, TSC1*, TSC2*, VHL*. RNA analysis is performed for * genes.     CHIEF COMPLIANT: Cycle 2 Enhertu   HISTORY OF PRESENT ILLNESS:  History of Present Illness Erica Wood is a 58 year old female with breast cancer who presents for chemotherapy management.  She experiences nausea and takes Zofran  more frequently for management. Severe constipation is present, likely due to Zofran . No diarrhea is noted. She is on peak dose Enhertu . Her white blood cell count has decreased from 6 to 3.9, while hemoglobin remains stable at 11.9. Liver enzymes are normal, and electrolytes, kidney, and liver functions are stable. A biopsy on May 22nd is being monitored for HER2 status. She has completed one round of chemotherapy.     ALLERGIES:  is allergic to beef-derived drug products, latex, sulfa antibiotics, and tape.  MEDICATIONS:  Current Outpatient Medications  Medication Sig Dispense Refill   amphetamine -dextroamphetamine (ADDERALL XR) 20 MG 24 hr capsule Take 1 capsule (20 mg total) by mouth daily. 30 capsule 0   Ascorbic Acid (VITAMIN C PO) Take 1,000 mg by mouth daily.     Cholecalciferol (VITAMIN D3) 75 MCG (3000 UT) TABS Take 3,000 Units by mouth daily.     estradiol  (ESTRACE ) 0.1 MG/GM vaginal cream INSERT 1 APPLICATORFUL VAGINALLY 3 TIMES A WEEK 42.5 g 12   famotidine (PEPCID) 20 MG tablet Take 20 mg by mouth at bedtime.     hydroxychloroquine  (PLAQUENIL ) 200 MG tablet TAKE 1 TABLET(200 MG) BY MOUTH DAILY 90 tablet 0   lidocaine -prilocaine  (EMLA ) cream Apply to affected area once 30 g 3   Magnesium  Citrate 100 MG TABS Take 1 tablet by mouth daily as needed. 30 tablet 3   minoxidil (LONITEN) 2.5 MG tablet Take 1.25 mg  by mouth daily.     oxyCODONE -acetaminophen  (PERCOCET/ROXICET) 5-325 MG tablet Take 1 tablet by mouth every 8 (eight) hours as needed for severe pain (pain score 7-10). 30 tablet 0   spironolactone (ALDACTONE) 25 MG tablet Take 25 mg by mouth daily as needed (swelling).     traMADol  (ULTRAM ) 50 MG tablet Take 1-2 tablets (50-100 mg total) by mouth every 6 (six) hours as needed. 60 tablet 0   tretinoin (RETIN-A) 0.1 % cream Apply 1 application  topically at bedtime as needed (acne).     triamterene -hydrochlorothiazide (DYAZIDE) 37.5-25 MG capsule TAKE 1 CAPSULE BY MOUTH DAILY 90 capsule 1   warfarin (COUMADIN ) 5 MG tablet TAKE 1 TABLET(5 MG) BY MOUTH DAILY 30 tablet PRN   acetaminophen  (TYLENOL ) 500 MG tablet Take 1,000 mg by mouth every 6 (six) hours as needed for moderate pain or headache. (Patient not taking: Reported on 04/14/2024)     capecitabine  (XELODA ) 500 MG tablet Take 2 tablets (1,000 mg total) by mouth 2 (two) times daily after a meal. 14 days on 7 days off (Patient not taking: Reported on 04/14/2024) 56 tablet 6   Clindamycin-Benzoyl Per, Refr, gel Apply topically. (Patient not taking: Reported on 04/14/2024)     clobetasol  cream (TEMOVATE ) 0.05 % Apply 1 Application topically 2 (two) times daily. Apply to affected area. (Patient not taking: Reported on 04/14/2024) 30 g 0   diclofenac  Sodium (VOLTAREN ) 1 % GEL Research Patient: Apply 0.5 grams (1 fingertip) to each hand and each foot twice daily for up to 12 weeks (Patient not taking: Reported on 04/14/2024) 400 g 0   ondansetron  (  ZOFRAN ) 8 MG tablet Take 1 tablet (8 mg total) by mouth every 8 (eight) hours as needed for nausea or vomiting. (Patient not taking: Reported on 04/14/2024) 30 tablet 6   prochlorperazine  (COMPAZINE ) 10 MG tablet Take 1 tablet (10 mg total) by mouth every 6 (six) hours as needed for nausea or vomiting. (Patient not taking: Reported on 04/14/2024) 30 tablet 1   No current facility-administered medications for this visit.     PHYSICAL EXAMINATION: ECOG PERFORMANCE STATUS: 1 - Symptomatic but completely ambulatory  There were no vitals filed for this visit. There were no vitals filed for this visit.  Physical Exam   (exam performed in the presence of a chaperone)  LABORATORY DATA:  I have reviewed the data as listed    Latest Ref Rng & Units 04/14/2024    2:30 PM 04/01/2024   11:07 AM 03/23/2024    2:43 PM  CMP  Glucose 70 - 99 mg/dL 578  91  469   BUN 6 - 20 mg/dL 16  16  17    Creatinine 0.44 - 1.00 mg/dL 6.29  5.28  4.13   Sodium 135 - 145 mmol/L 134  135  135   Potassium 3.5 - 5.1 mmol/L 4.0  4.1  4.1   Chloride 98 - 111 mmol/L 101  100  100   CO2 22 - 32 mmol/L 29  31  31    Calcium 8.9 - 10.3 mg/dL 8.9  9.0  8.9   Total Protein 6.5 - 8.1 g/dL 6.6  6.8  6.5   Total Bilirubin 0.0 - 1.2 mg/dL 0.5  0.6  0.6   Alkaline Phos 38 - 126 U/L 87  90  89   AST 15 - 41 U/L 36  50  27   ALT 0 - 44 U/L 28  35  15     Lab Results  Component Value Date   WBC 3.9 (L) 04/14/2024   HGB 11.9 (L) 04/14/2024   HCT 34.4 (L) 04/14/2024   MCV 98.6 04/14/2024   PLT 195 04/14/2024   NEUTROABS 1.9 04/14/2024    ASSESSMENT & PLAN:  Malignant neoplasm of lower-outer quadrant of right breast of female, estrogen receptor positive (HCC) 08/04/2020: Right breast biopsy T2 N0 M1 stage IV IDC grade 3, triple positive with a Ki-67 of 40%, multiple liver lesions biopsy was positive for breast cancer ER/PR positive HER2 negative with a Ki-67 30% 08/31/2020-12/14/2020: TCHP x6 01/29/2021: Letrozole  and palbociclib    11/29/2022-06/05/2023: Olaparib  07/13/2023-03/24/2023: Xeloda      10/24/2022: CT CAP: Multiple new hepatic lesions measuring up to 1.5 cm consistent with metastatic disease, stable 5 mm nodule left costophrenic sulcus 12/05/2021: [facility]: ATM gene mutation (possible treatment option olaparib ), MSI high: Not detected.  Will refer the patient to genetic testing  PET CT scan 03/03/2023: Widespread hypermetabolic  metastatic disease within the liver is similar to previous PET/CT 06/02/2023: Interval progression of hepatic metastases.  Progression of bone metastases.  Suspicion of peritoneal disease.   06/13/2023: [facility]: ATM, T p53, FGF R1 amplification, TMB 4.73 mutations 03/30/2024: Right breast biopsy: IDC grade 2, ER 90%, PR 10%, Ki67 40%, HER2 2+ by IHC, FISH positive ratio 2.97 ---------------------------------------------------------------------------------------------------------------------------- Current treatment: Enhertu  cycle 2 Chemo toxicities: Nausea: advised AM Zofran  and Pepcid: I will reduce the dosage of Enhertu  for cycle 2 Fatigue Thrush: sent diflucan : Resolved Echocardiogram 03/24/2024: EF 55 to 60%   The tumor markers have not yet come down.  We are watching closely. Return to clinic in 3 weeks  for cycle 3 ------------------------------------- Assessment and Plan Assessment & Plan Nausea due to chemotherapy Nausea persists despite Zofran , likely exacerbated by high-dose Enhertu . Enhertu  effectively targets HER2-positive breast cancer. Lower dose expected to enhance response and tolerance. - Advise increased use of Compazine  and decreased use of Zofran  for nausea management. - Adjust Enhertu  dosage to a lower level to improve tolerance. - Reassess response to adjusted Enhertu  dosage after the third round with a scan.  Constipation due to Zofran  Severe constipation likely related to Zofran . - Continue Miralax as needed for constipation relief.      No orders of the defined types were placed in this encounter.  The patient has a good understanding of the overall plan. she agrees with it. she will call with any problems that may develop before the next visit here. Total time spent: 30 mins including face to face time and time spent for planning, charting and co-ordination of care   Margert Sheerer, MD 04/14/24

## 2024-04-14 NOTE — Assessment & Plan Note (Signed)
 08/04/2020: Right breast biopsy T2 N0 M1 stage IV IDC grade 3, triple positive with a Ki-67 of 40%, multiple liver lesions biopsy was positive for breast cancer ER/PR positive HER2 negative with a Ki-67 30% 08/31/2020-12/14/2020: TCHP x6 01/29/2021: Letrozole  and palbociclib    11/29/2022-06/05/2023: Olaparib  07/13/2023-03/24/2023: Xeloda      10/24/2022: CT CAP: Multiple new hepatic lesions measuring up to 1.5 cm consistent with metastatic disease, stable 5 mm nodule left costophrenic sulcus 12/05/2021: [facility]: ATM gene mutation (possible treatment option olaparib ), MSI high: Not detected.  Will refer the patient to genetic testing   Lab review: CA 27-29: 12/19/2023: 1650   PET CT scan 03/03/2023: Widespread hypermetabolic metastatic disease within the liver is similar to previous PET/CT 06/02/2023: Interval progression of hepatic metastases.  Progression of bone metastases.  Suspicion of peritoneal disease.   06/13/2023: [facility]: ATM, T p53, FGF R1 amplification, TMB 4.73 mutations ---------------------------------------------------------------------------------------------------------------------------- Current treatment: Enhertu  cycle 2 Chemo toxicities: Nausea: advised AM Zofran  and Pepcid Fatigue Thrush: sent diflucan  Echocardiogram 03/24/2024: EF 55 to 60%   Return to clinic in 3 weeks for cycle 3

## 2024-04-14 NOTE — Patient Instructions (Signed)

## 2024-04-15 LAB — CANCER ANTIGEN 27.29: CA 27.29: 3700.4 U/mL — ABNORMAL HIGH (ref 0.0–38.6)

## 2024-04-21 ENCOUNTER — Telehealth: Payer: Self-pay | Admitting: Neurology

## 2024-04-21 ENCOUNTER — Encounter: Payer: Self-pay | Admitting: Neurology

## 2024-04-21 ENCOUNTER — Encounter: Payer: Self-pay | Admitting: Hematology and Oncology

## 2024-04-21 ENCOUNTER — Ambulatory Visit (INDEPENDENT_AMBULATORY_CARE_PROVIDER_SITE_OTHER): Payer: 59 | Admitting: Neurology

## 2024-04-21 VITALS — BP 97/57 | HR 63 | Ht 64.0 in | Wt 114.0 lb

## 2024-04-21 DIAGNOSIS — H539 Unspecified visual disturbance: Secondary | ICD-10-CM

## 2024-04-21 DIAGNOSIS — H5460 Unqualified visual loss, one eye, unspecified: Secondary | ICD-10-CM | POA: Diagnosis not present

## 2024-04-21 DIAGNOSIS — G43109 Migraine with aura, not intractable, without status migrainosus: Secondary | ICD-10-CM | POA: Diagnosis not present

## 2024-04-21 NOTE — Patient Instructions (Addendum)
 MRI of the brain w/wo contrast  May want to try magnesium  at night as discussed  Magnesium  doses bedtime 200 mg mag citrate 400 mg mag oxide  Mag sulfate 600 mg daily Mag Gluconate 200-400 at bedtime  Other options are migraine prevention but taking something at the onset is difficult because it is so brief. Would try Magnesium  first and manage Migraine triggers.   Migraine Headache A migraine headache is an intense pulsing or throbbing pain on one or both sides of the head. Migraine headaches may also cause other symptoms, such as nausea, vomiting, and sensitivity to light and noise. A migraine headache can last from 4 hours to 3 days. Talk with your health care provider about what things may bring on (trigger) your migraine headaches. What are the causes? The exact cause is not known. However, a migraine may be caused when nerves in the brain get irritated and release chemicals that cause blood vessels to become inflamed. This inflammation causes pain. Migraines may be triggered or caused by: Smoking. Medicines, such as: Nitroglycerin, which is used to treat chest pain. Birth control pills. Estrogen. Certain blood pressure medicines. Foods or drinks that contain nitrates, glutamate, aspartame, MSG, or tyramine. Certain foods or drinks, such as aged cheeses, chocolate, alcohol, or caffeine. Doing physical activity that is very hard. Other triggers may include: Menstruation. Pregnancy. Hunger. Stress. Getting too much or too little sleep. Weather changes. Tiredness (fatigue). What increases the risk? The following factors may make you more likely to have migraine headaches: Being between the ages of 44-108 years old. Being female. Having a family history of migraine headaches. Being Caucasian. Having a mental health condition, such as depression or anxiety. Being obese. What are the signs or symptoms? The main symptom of this condition is pulsing or throbbing pain. This pain  may: Happen in any area of the head, such as on one or both sides. Make it hard to do daily activities. Get worse with physical activity. Get worse around bright lights, loud noises, or smells. Other symptoms may include: Nausea. Vomiting. Dizziness. Before a migraine headache starts, you may get warning signs (an aura). An aura may include: Seeing flashing lights or having blind spots. Seeing bright spots, halos, or zigzag lines. Having tunnel vision or blurred vision. Having numbness or a tingling feeling. Having trouble talking. Having muscle weakness. After a migraine ends, you may have symptoms. These may include: Feeling tired. Trouble concentrating. How is this diagnosed? A migraine headache can be diagnosed based on: Your symptoms. A physical exam. Tests, such as: A CT scan or an MRI of the head. These tests can help rule out other causes of headaches. Taking fluid from the spine (lumbar puncture) to examine it (cerebrospinal fluid analysis, or CSF analysis). How is this treated? This condition may be treated with medicines that: Relieve pain and nausea. Prevent migraines. Treatment may also include: Acupuncture. Lifestyle changes like avoiding foods that trigger migraine headaches. Learning ways to control your body (biofeedback). Talk therapy to help you know and deal with negative thoughts (cognitive behavioral therapy). Follow these instructions at home: Medicines Take over-the-counter and prescription medicines only as told by your provider. Ask your provider if the medicine prescribed to you: Requires you to avoid driving or using machinery. Can cause constipation. You may need to take these actions to prevent or treat constipation: Drink enough fluid to keep your pee (urine) pale yellow. Take over-the-counter or prescription medicines. Eat foods that are high in fiber, such as beans,  whole grains, and fresh fruits and vegetables. Limit foods that are high in  fat and processed sugars, such as fried or sweet foods. Lifestyle  Do not drink alcohol. Do not use any products that contain nicotine or tobacco. These products include cigarettes, chewing tobacco, and vaping devices, such as e-cigarettes. If you need help quitting, ask your provider. Get 7-9 hours of sleep each night, or the amount recommended by your provider. Find ways to manage stress, such as meditation, deep breathing, or yoga. Try to exercise regularly. This can help lessen how bad and how often your migraines occur. General instructions Keep a journal to find out what triggers your migraines, so you can avoid those things. For example, write down: What you eat and drink. How much sleep you get. Any change to your diet or medicines. If you have a migraine headache: Avoid things that make your symptoms worse, such as bright lights. Lie down in a dark, quiet room. Do not drive or use machinery. Ask your provider what activities are safe for you while you have symptoms. Keep all follow-up visits. Your provider will monitor your symptoms and recommend any further treatment. Where to find more information Coalition for Headache and Migraine Patients (CHAMP): headachemigraine.org American Migraine Foundation: americanmigrainefoundation.org National Headache Foundation: headaches.org Contact a health care provider if: You have symptoms that are different or worse than your usual migraine headache symptoms. You have more than 15 days of headaches in one month. Get help right away if: Your migraine headache becomes severe or lasts more than 72 hours. You have a fever or stiff neck. You have vision loss. Your muscles feel weak or like you cannot control them. You lose your balance often or have trouble walking. You faint. You have a seizure. This information is not intended to replace advice given to you by your health care provider. Make sure you discuss any questions you have with  your health care provider. Document Revised: 06/24/2022 Document Reviewed: 06/24/2022 Elsevier Patient Education  2024 [employer].

## 2024-04-21 NOTE — Progress Notes (Signed)
 [facility]    Provider:  Dr Tresia Fruit Requesting Provider: Cameron Cea, MD Primary Care Provider:  Rey Catholic, MD  CC:  Visual changes  HPI:  Erica Wood is a 58 y.o. female here as requested by Cameron Cea, MD for Oular migraines. has Varicose veins of bilateral lower extremities with other complications; Spider veins of both lower extremities; Autoimmune disease (HCC); High risk medication use; Primary osteoarthritis of both hands; History of DVT (deep vein thrombosis); History of pulmonary embolism; Primary insomnia; Anxiety; Irritable bowel syndrome with constipation; Raynaud's disease without gangrene; Discoid lupus; Anticardiolipin antibody positive; Primary osteoarthritis of both knees; Abnormal cervical Papanicolaou smear; Fibrocystic disease of breast; Pulmonary embolism (HCC); Raynaud's phenomenon; Malignant neoplasm of lower-outer quadrant of right breast of female, estrogen receptor positive (HCC); Aortic atherosclerosis (HCC); Port-A-Cath in place; Malignant neoplasm metastatic to liver [facility]); Goals of care, counseling/discussion; Monoallelic mutation of ATM gene; Genetic testing; and Migraine aura occurring with and without headache on their problem list.  Symptoms started last year. Looks like a gear in either eye, lasts a few minutes and can;t focus, she had migraines years ago with an aura then too she would know that it is coming. She started having visiual changes last year without headache but these are different viual patterns. Like a notched circle, pulsates with colors, more in the middle, she cannot focus in the eye, waits fo it to pass in a few minutes, in one or the other eye now 9years ago she had an aura maybe in both eyes). No rhyme or reason for the aura, unknown triggers, can happen at work or at home can;t figure it ot. Can happen once a week, sometimes not for a month. She had one last a few weeks ago. Is seeing eye doctor in 2 weeks.  Doesn;t notice any other symptoms, no weakness, no numbness or tingling, no stroke-like symptms or focal symptoms. Has black but other colors too rght colors, reds. Completely resolves on its own and then it is fine. The same every time, happens in either eye. On warfarin.   From a thorough review of records and patient report, Medications tried that can be used in migraine/headache management greater than 3 months include: Lifestyle modification, headache diaries, better sleep hygiene, exercise, management of migraine triggers, OTC and prescribed analgesics/nsaids such as ibuprofen, excedrin, alleve and others,    Reviewed notes, labs and imaging from outside physicians, which showed:     Latest Ref Rng & Units 04/14/2024    2:30 PM 04/01/2024   11:07 AM 03/23/2024    2:43 PM  CBC  WBC 4.0 - 10.5 K/uL 3.9  4.7  6.0   Hemoglobin 12.0 - 15.0 g/dL 54.0  98.1  19.1   Hematocrit 36.0 - 46.0 % 34.4  36.8  34.8   Platelets 150 - 400 K/uL 195  97  114        Latest Ref Rng & Units 04/14/2024    2:30 PM 04/01/2024   11:07 AM 03/23/2024    2:43 PM  CMP  Glucose 70 - 99 mg/dL 478  91  295   BUN 6 - 20 mg/dL 16  16  17    Creatinine 0.44 - 1.00 mg/dL 6.21  3.08  6.57   Sodium 135 - 145 mmol/L 134  135  135   Potassium 3.5 - 5.1 mmol/L 4.0  4.1  4.1   Chloride 98 - 111 mmol/L 101  100  100   CO2 22 - 32  mmol/L 29  31  31    Calcium 8.9 - 10.3 mg/dL 8.9  9.0  8.9   Total Protein 6.5 - 8.1 g/dL 6.6  6.8  6.5   Total Bilirubin 0.0 - 1.2 mg/dL 0.5  0.6  0.6   Alkaline Phos 38 - 126 U/L 87  90  89   AST 15 - 41 U/L 36  50  27   ALT 0 - 44 U/L 28  35  15    06/2022 b12 nml 797, b6 nml, vit D 82 nml   MRI brain 06/13/2022: CLINICAL DATA:  Metastatic breast carcinoma   EXAM: MRI HEAD WITHOUT AND WITH CONTRAST   TECHNIQUE: Multiplanar, multiecho pulse sequences of the brain and surrounding structures were obtained without and with intravenous contrast.   CONTRAST:  5mL GADAVIST  GADOBUTROL  1 MMOL/ML  IV SOLN   COMPARISON:  None Available.   FINDINGS: Brain: No acute infarct, mass effect or extra-axial collection. No acute or chronic hemorrhage. Normal white matter signal, parenchymal volume and CSF spaces. The midline structures are normal. There is no abnormal contrast enhancement.   Vascular: Major flow voids are preserved.   Skull and upper cervical spine: Normal calvarium and skull base. Visualized upper cervical spine and soft tissues are normal.   Sinuses/Orbits:No paranasal sinus fluid levels or advanced mucosal thickening. No mastoid or middle ear effusion. Normal orbits.   IMPRESSION: Normal brain MRI. No metastatic disease.  Review of Systems: Patient complains of symptoms per HPI as well as the following symptoms metastatic cancer. Pertinent negatives and positives per HPI. All others negative.   Social History   Socioeconomic History   Marital status: Widowed    Spouse name: Not on file   Number of children: Not on file   Years of education: Not on file   Highest education level: Not on file  Occupational History   Not on file  Tobacco Use   Smoking status: Former    Current packs/day: 0.00    Average packs/day: 0.5 packs/day for 5.0 years (2.5 ttl pk-yrs)    Types: Cigarettes    Start date: 08/06/2006    Quit date: 08/07/2011    Years since quitting: 12.7    Passive exposure: Never   Smokeless tobacco: Never  Vaping Use   Vaping status: Never Used  Substance and Sexual Activity   Alcohol use: Yes    Comment: occasionally   Drug use: No   Sexual activity: Yes    Birth control/protection: Pill  Other Topics Concern   Not on file  Social History Narrative   Pt lives alone    Pt works    Social Drivers of Corporate investment banker Strain: Not on file  Food Insecurity: Not on file  Transportation Needs: Not on file  Physical Activity: Not on file  Stress: Not on file  Social Connections: Unknown (03/20/2022)   Received from [facility],  [facility]   Social Network    Social Network: Not on file  Intimate Partner Violence: Unknown (02/15/2022)   Received from [facility], [facility]   HITS    Physically Hurt: Not on file    Insult or Talk Down To: Not on file    Threaten Physical Harm: Not on file    Scream or Curse: Not on file    Family History  Problem Relation Age of Onset   Other Mother        blood clots   Heart attack Mother  Rheum arthritis Father    Hemachromatosis Father    Heart Problems Father    Cancer - Other Sister        Ewing sarcoma; d. 57   Other Brother        blood clots   Lung cancer Maternal Aunt        smoking hx; 51s   Cancer Paternal Grandmother 33       unknown type   Colon cancer Neg Hx    Stomach cancer Neg Hx    Esophageal cancer Neg Hx    Pancreatic cancer Neg Hx    Liver disease Neg Hx    Migraines Neg Hx     Past Medical History:  Diagnosis Date   Anti-cardiolipin antibody positive    Anti-cardiolipin antibody syndrome (HCC)    Anxiety    Arthralgia    Arthritis    bil knees, hands   Atypical chest pain    Autoimmune disease (HCC)    Autoimmune disease (HCC) 11/25/2016   Breast cancer metastasized to liver (HCC) 09/2020   Cancer (HCC) 07/2020   right breast IDC   Chronic fatigue    Depression    DVT (deep venous thrombosis) (HCC)    IBS (irritable bowel syndrome)    with constipation   Lupus    Lymphedema    bilateral legs per patient   Monoallelic mutation of ATM gene 16/08/9603   Pulmonary embolus (HCC)    Raynaud's syndrome    Sicca (HCC)    Thrombocytopenia (HCC)     Patient Active Problem List   Diagnosis Date Noted   Migraine aura occurring with and without headache 04/21/2024   Monoallelic mutation of ATM gene 54/07/8118   Genetic testing 02/04/2023   Goals of care, counseling/discussion 05/09/2021   Malignant neoplasm metastatic to liver (HCC) 01/03/2021   Port-A-Cath in place 11/02/2020   Aortic atherosclerosis (HCC) 09/21/2020    Malignant neoplasm of lower-outer quadrant of right breast of female, estrogen receptor positive (HCC) 08/10/2020   Abnormal cervical Papanicolaou smear 07/12/2020   Pulmonary embolism (HCC) 07/12/2020   Fibrocystic disease of breast 01/22/2018   Primary osteoarthritis of both knees 05/02/2017   Raynaud's disease without gangrene 11/26/2016   Discoid lupus 11/26/2016   Anticardiolipin antibody positive 11/26/2016   Autoimmune disease (HCC) 11/25/2016   High risk medication use 11/25/2016   Primary osteoarthritis of both hands 11/25/2016   History of DVT (deep vein thrombosis) 11/25/2016   History of pulmonary embolism 11/25/2016   Primary insomnia 11/25/2016   Anxiety 11/25/2016   Irritable bowel syndrome with constipation 11/25/2016   Raynaud's phenomenon 01/03/2016   Varicose veins of bilateral lower extremities with other complications 05/24/2014   Spider veins of both lower extremities 05/24/2014    Past Surgical History:  Procedure Laterality Date   ABLATION  12/2018   leg veins   ABLATION Right 08/2019   venous leg    APPENDECTOMY     BREAST BIOPSY Right 03/30/2024   US  RT BREAST BX W LOC DEV 1ST LESION IMG BX SPEC US  GUIDE 03/30/2024 [facility]   COLON SURGERY     Perforated colon repair after colostomy   EYE SURGERY Bilateral 2022   GANGLION CYST EXCISION     HYSTEROSCOPY WITH D & C N/A 01/13/2014   Procedure: DILATATION AND CURETTAGE /HYSTEROSCOPY with resection ;  Surgeon: Hamp Levine, MD;  Location: [facility];  Service: Gynecology;  Laterality: N/A;   LAPAROTOMY     PORTACATH PLACEMENT N/A  09/13/2020   Procedure: INSERTION PORT-A-CATH WITH ULTRASOUND GUIDANCE;  Surgeon: Enid Harry, MD;  Location: [facility];  Service: General;  Laterality: N/A;    Current Outpatient Medications  Medication Sig Dispense Refill   acetaminophen  (TYLENOL ) 500 MG tablet Take 1,000 mg by mouth as needed for moderate pain (pain score 4-6) or headache.      amphetamine -dextroamphetamine (ADDERALL XR) 20 MG 24 hr capsule Take 1 capsule (20 mg total) by mouth daily. 30 capsule 0   Ascorbic Acid (VITAMIN C PO) Take 1,000 mg by mouth daily.     Cholecalciferol (VITAMIN D3) 75 MCG (3000 UT) TABS Take 3,000 Units by mouth daily.     diclofenac  Sodium (VOLTAREN ) 1 % GEL Research Patient: Apply 0.5 grams (1 fingertip) to each hand and each foot twice daily for up to 12 weeks 400 g 0   estradiol  (ESTRACE ) 0.1 MG/GM vaginal cream INSERT 1 APPLICATORFUL VAGINALLY 3 TIMES A WEEK 42.5 g 12   famotidine (PEPCID) 20 MG tablet Take 20 mg by mouth at bedtime.     hydroxychloroquine  (PLAQUENIL ) 200 MG tablet TAKE 1 TABLET(200 MG) BY MOUTH DAILY 90 tablet 0   lidocaine -prilocaine  (EMLA ) cream Apply to affected area once 30 g 3   Magnesium  Citrate 100 MG TABS Take 1 tablet by mouth daily as needed. 30 tablet 3   minoxidil (LONITEN) 2.5 MG tablet Take 1.25 mg by mouth daily.     ondansetron  (ZOFRAN ) 8 MG tablet Take 1 tablet (8 mg total) by mouth every 8 (eight) hours as needed for nausea or vomiting. 30 tablet 6   oxyCODONE -acetaminophen  (PERCOCET/ROXICET) 5-325 MG tablet Take 1 tablet by mouth every 8 (eight) hours as needed for severe pain (pain score 7-10). 30 tablet 0   prochlorperazine  (COMPAZINE ) 10 MG tablet Take 1 tablet (10 mg total) by mouth every 6 (six) hours as needed for nausea or vomiting. 30 tablet 1   spironolactone (ALDACTONE) 25 MG tablet Take 25 mg by mouth daily as needed (swelling).     traMADol  (ULTRAM ) 50 MG tablet Take 1-2 tablets (50-100 mg total) by mouth every 6 (six) hours as needed. 60 tablet 0   tretinoin (RETIN-A) 0.1 % cream Apply 1 application  topically at bedtime as needed (acne).     triamterene -hydrochlorothiazide (DYAZIDE) 37.5-25 MG capsule TAKE 1 CAPSULE BY MOUTH DAILY 90 capsule 1   warfarin (COUMADIN ) 5 MG tablet TAKE 1 TABLET(5 MG) BY MOUTH DAILY 30 tablet PRN   No current facility-administered medications for this visit.     Allergies as of 04/21/2024 - Review Complete 04/21/2024  Allergen Reaction Noted   Beef-derived drug products Hives 08/06/2012   Latex Rash 08/06/2012   Sulfa antibiotics Rash 08/06/2012   Tape Rash 01/17/2022    Vitals: BP (!) 97/57   Pulse 63   Ht 5' 4 (1.626 m)   Wt 114 lb (51.7 kg)   BMI 19.57 kg/m  Last Weight:  Wt Readings from Last 1 Encounters:  04/21/24 114 lb (51.7 kg)   Last Height:   Ht Readings from Last 1 Encounters:  04/21/24 5' 4 (1.626 m)     Physical exam: Exam: Gen: NAD, conversant, well nourised,                     CV: RRR, no MRG. No Carotid Bruits. No peripheral edema, warm, nontender Eyes: Conjunctivae clear without exudates or hemorrhage  Neuro: Detailed Neurologic Exam  Speech:    Speech is normal; fluent  and spontaneous with normal comprehension.  Cognition:    The patient is oriented to person, place, and time;     recent and remote memory intact;     language fluent;     normal attention, concentration,     fund of knowledge Cranial Nerves:    The pupils are equal, round, and reactive to light. The fundi are without papilledema. Visual fields are full to finger confrontation. Extraocular movements are intact. Trigeminal sensation is intact and the muscles of mastication are normal. The face is symmetric. The palate elevates in the midline. Hearing intact. Voice is normal. Shoulder shrug is normal. The tongue has normal motion without fasciculations.   Coordination: nml  Gait: nml  Motor Observation:    No asymmetry, no atrophy, and no involuntary movements noted. Tone:    Normal muscle tone.    Posture:    Posture is normal. normal erect    Strength:    Strength is V/V in the upper and lower limbs.      Sensation: intact to LT     Reflex Exam:  DTR's:    Deep tendon reflexes in the upper and lower extremities are symmetrical bilaterally.  Absent AJs.  Toes:    The toes are downgoing bilaterally.   Clonus:     Clonus is absent.    Assessment/Plan:  Patient with a history of migraine with aura, with visual changes. has Varicose veins of bilateral lower extremities with other complications; Spider veins of both lower extremities; Autoimmune disease (HCC); High risk medication use; Primary osteoarthritis of both hands; History of DVT (deep vein thrombosis); History of pulmonary embolism; Primary insomnia; Anxiety; Irritable bowel syndrome with constipation; Raynaud's disease without gangrene; Discoid lupus; Anticardiolipin antibody positive; Primary osteoarthritis of both knees; Abnormal cervical Papanicolaou smear; Fibrocystic disease of breast; Pulmonary embolism (HCC); Raynaud's phenomenon; Malignant neoplasm of lower-outer quadrant of right breast of female, estrogen receptor positive (HCC); Aortic atherosclerosis (HCC); Port-A-Cath in place; Malignant neoplasm metastatic to liver [facility]); Goals of care, counseling/discussion; Monoallelic mutation of ATM gene; Genetic testing; and Migraine aura occurring with and without headache on their problem list.   More consistent with migraine aura given repeated similar episodes in both eyes, with negative and positive visual symptoms(loss of vision also colors), resolving completely Treating acutely is difficult because lasts a few minutes Could treat by placing on a migraine preventative Try Magnesium  at bedtime, may want to try that first, Magnesium 's benefits include migraine aura prevention becaue it can interrupt the cortical spreading depression thought to be the mechanism in aura But Given monocular vision loss in the setting of malignant neoplasm recommend MRI brain w/wo contrast to rule out metastatic disease or stroke Get eyes evaluated (has appointment) On anticoagulation for other cause Magnesium  doses bedtime: choose one: 200 mg mag citrate, 400 mg mag oxide, Mag sulfate 600 mg dailyat bedtime, Mag Gluconate 200-400 at bedtime Again, options are migraine  prevention but taking something at the onset is difficult because it is so brief. Would try Magnesium  first and manage Migraine triggers.   Also spent time education of migraines and migraine management, medication choices, acute vs prevention, strategies as well as below To prevent or relieve headaches, try the following: Cool Compress. Lie down and place a cool compress on your head.  Avoid headache triggers. If certain foods or odors seem to have triggered your migraines in the past, avoid them. A headache diary might help you identify triggers.  Include physical activity in your daily routine.  Try a daily walk or other moderate aerobic exercise.  Manage stress. Find healthy ways to cope with the stressors, such as delegating tasks on your to-do list.  Practice relaxation techniques. Try deep breathing, yoga, massage and visualization.  Eat regularly. Eating regularly scheduled meals and maintaining a healthy diet might help prevent headaches. Also, drink plenty of fluids.  Follow a regular sleep schedule. Sleep deprivation might contribute to headaches Consider biofeedback. With this mind-body technique, you learn to control certain bodily functions -- such as muscle tension, heart rate and blood pressure -- to prevent headaches or reduce headache pain.    Proceed to emergency room if you experience new or worsening symptoms or symptoms do not resolve, if you have new neurologic symptoms or if headache is severe, or for any concerning symptom.   Provided education and documentation including informational packet on: migraines, symptoms as well as aura aura, what causes migraines, migraine triggers, chronic migraine medication overuse headache, chronic migraines, prevention of migraines both acute and preventative and the different choices and classes, nondrug treatments, behavioral and other nonpharmacologic treatments for headache.    Orders Placed This Encounter  Procedures   MR BRAIN W WO  CONTRAST   No orders of the defined types were placed in this encounter.   Cc: Cameron Cea, MD,  Rey Catholic, MD  Aldona Amel, MD  [facility] [address] [place], [place] [address]  Phone [phone] Fax [phone]

## 2024-04-21 NOTE — Telephone Encounter (Signed)
 medicare/[employer]HC no auth required Case Number: [id number] sent to [facility] [phone]

## 2024-04-26 ENCOUNTER — Other Ambulatory Visit: Payer: Self-pay | Admitting: Neurology

## 2024-04-26 ENCOUNTER — Encounter: Payer: Self-pay | Admitting: Neurology

## 2024-04-26 MED ORDER — ALPRAZOLAM 0.25 MG PO TABS: ORAL_TABLET | ORAL | 0 refills | Status: AC

## 2024-04-28 ENCOUNTER — Encounter: Payer: Self-pay | Admitting: Hematology and Oncology

## 2024-04-29 ENCOUNTER — Telehealth: Payer: Self-pay | Admitting: *Deleted

## 2024-04-29 NOTE — Telephone Encounter (Signed)
 04/28/2024, Completed [employer] FMLA paperwork   Sent to provider to review, amend, sign and return to this nurse to return to claims benefit manager..  Today, received FMLA paperwork signed by provider.  Successfully returned to [facility] via fax [phone]).  Copy to [facility] HIM bin for items to be scanned.  No records requested to send to ([facility]) H.I.M.  Copy to [facility] file folder near registrar no.#1 for pick up or return to patient.  As discusssed when Spoke with patient about form.   Process completed with no further instructions received or actions performed by this nurse.  04/27/2024 Unable to connect with [employer].  Connected with Bobie Burton Busta to inquire about reason for duplicate form.

## 2024-04-30 ENCOUNTER — Ambulatory Visit (HOSPITAL_COMMUNITY)
Admission: RE | Admit: 2024-04-30 | Discharge: 2024-04-30 | Disposition: A | Discharge status: Home / Self Care | Source: Ambulatory Visit | Attending: Neurology | Admitting: Neurology

## 2024-04-30 DIAGNOSIS — H5460 Unqualified visual loss, one eye, unspecified: Secondary | ICD-10-CM | POA: Insufficient documentation

## 2024-04-30 DIAGNOSIS — H539 Unspecified visual disturbance: Secondary | ICD-10-CM | POA: Insufficient documentation

## 2024-04-30 MED ORDER — GADOBUTROL 1 MMOL/ML IV SOLN
5.0000 mL | Freq: Once | INTRAVENOUS | Status: AC | PRN
Start: 2024-04-30 — End: 2024-04-30
  Administered 2024-04-30: 5 mL via INTRAVENOUS

## 2024-05-01 ENCOUNTER — Other Ambulatory Visit: Payer: Self-pay | Admitting: Hematology and Oncology

## 2024-05-01 DIAGNOSIS — Z170 Estrogen receptor positive status [ER+]: Secondary | ICD-10-CM

## 2024-05-03 ENCOUNTER — Encounter: Payer: Self-pay | Admitting: Hematology and Oncology

## 2024-05-03 ENCOUNTER — Ambulatory Visit: Payer: Self-pay | Admitting: Neurology

## 2024-05-05 ENCOUNTER — Ambulatory Visit

## 2024-05-05 ENCOUNTER — Other Ambulatory Visit

## 2024-05-05 ENCOUNTER — Ambulatory Visit: Admitting: Hematology and Oncology

## 2024-05-05 MED FILL — Fosaprepitant Dimeglumine For IV Infusion 150 MG (Base Eq): INTRAVENOUS | Qty: 5 | Status: AC

## 2024-05-06 ENCOUNTER — Inpatient Hospital Stay (HOSPITAL_BASED_OUTPATIENT_CLINIC_OR_DEPARTMENT_OTHER): Admitting: Hematology and Oncology

## 2024-05-06 ENCOUNTER — Inpatient Hospital Stay

## 2024-05-06 VITALS — BP 118/73 | HR 56 | Temp 98.0°F | Resp 18 | Ht 64.0 in | Wt 114.4 lb

## 2024-05-06 DIAGNOSIS — C50511 Malignant neoplasm of lower-outer quadrant of right female breast: Secondary | ICD-10-CM | POA: Diagnosis not present

## 2024-05-06 DIAGNOSIS — Z95828 Presence of other vascular implants and grafts: Secondary | ICD-10-CM

## 2024-05-06 DIAGNOSIS — Z170 Estrogen receptor positive status [ER+]: Secondary | ICD-10-CM

## 2024-05-06 DIAGNOSIS — Z5112 Encounter for antineoplastic immunotherapy: Secondary | ICD-10-CM | POA: Diagnosis not present

## 2024-05-06 LAB — CBC WITH DIFFERENTIAL (CANCER CENTER ONLY)
Abs Immature Granulocytes: 0 10*3/uL (ref 0.00–0.07)
Basophils Absolute: 0.1 10*3/uL (ref 0.0–0.1)
Basophils Relative: 1 %
Eosinophils Absolute: 0 10*3/uL (ref 0.0–0.5)
Eosinophils Relative: 1 %
HCT: 33.5 % — ABNORMAL LOW (ref 36.0–46.0)
Hemoglobin: 11.4 g/dL — ABNORMAL LOW (ref 12.0–15.0)
Immature Granulocytes: 0 %
Lymphocytes Relative: 37 %
Lymphs Abs: 1.4 10*3/uL (ref 0.7–4.0)
MCH: 33.8 pg (ref 26.0–34.0)
MCHC: 34 g/dL (ref 30.0–36.0)
MCV: 99.4 fL (ref 80.0–100.0)
Monocytes Absolute: 0.4 10*3/uL (ref 0.1–1.0)
Monocytes Relative: 10 %
Neutro Abs: 2 10*3/uL (ref 1.7–7.7)
Neutrophils Relative %: 51 %
Platelet Count: 146 10*3/uL — ABNORMAL LOW (ref 150–400)
RBC: 3.37 MIL/uL — ABNORMAL LOW (ref 3.87–5.11)
RDW: 13.8 % (ref 11.5–15.5)
WBC Count: 3.9 10*3/uL — ABNORMAL LOW (ref 4.0–10.5)
nRBC: 0 % (ref 0.0–0.2)

## 2024-05-06 LAB — CMP (CANCER CENTER ONLY)
ALT: 26 U/L (ref 0–44)
AST: 30 U/L (ref 15–41)
Albumin: 3.9 g/dL (ref 3.5–5.0)
Alkaline Phosphatase: 82 U/L (ref 38–126)
Anion gap: 5 (ref 5–15)
BUN: 18 mg/dL (ref 6–20)
CO2: 28 mmol/L (ref 22–32)
Calcium: 8.8 mg/dL — ABNORMAL LOW (ref 8.9–10.3)
Chloride: 104 mmol/L (ref 98–111)
Creatinine: 0.84 mg/dL (ref 0.44–1.00)
GFR, Estimated: >60 mL/min (ref >60)
Glucose, Bld: 80 mg/dL (ref 70–99)
Potassium: 3.8 mmol/L (ref 3.5–5.1)
Sodium: 137 mmol/L (ref 135–145)
Total Bilirubin: 0.4 mg/dL (ref 0.0–1.2)
Total Protein: 6.3 g/dL — ABNORMAL LOW (ref 6.5–8.1)

## 2024-05-06 MED ORDER — FAM-TRASTUZUMAB DERUXTECAN-NXKI CHEMO 100 MG IV SOLR
4.4000 mg/kg | Freq: Once | INTRAVENOUS | Status: AC
  Administered 2024-05-06: 240 mg via INTRAVENOUS
  Filled 2024-05-06: qty 12

## 2024-05-06 MED ORDER — PALONOSETRON HCL INJECTION 0.25 MG/5ML
0.2500 mg | Freq: Once | INTRAVENOUS | Status: AC
  Administered 2024-05-06: 0.25 mg via INTRAVENOUS
  Filled 2024-05-06: qty 5

## 2024-05-06 MED ORDER — DIPHENHYDRAMINE HCL 25 MG PO CAPS
25.0000 mg | ORAL_CAPSULE | Freq: Once | ORAL | Status: AC
  Administered 2024-05-06: 25 mg via ORAL
  Filled 2024-05-06: qty 1

## 2024-05-06 MED ORDER — ACETAMINOPHEN 325 MG PO TABS
650.0000 mg | ORAL_TABLET | Freq: Once | ORAL | Status: AC
  Administered 2024-05-06: 650 mg via ORAL
  Filled 2024-05-06: qty 2

## 2024-05-06 MED ORDER — SODIUM CHLORIDE 0.9 % IV SOLN
150.0000 mg | Freq: Once | INTRAVENOUS | Status: AC
  Administered 2024-05-06: 150 mg via INTRAVENOUS
  Filled 2024-05-06: qty 150

## 2024-05-06 MED ORDER — HEPARIN SOD (PORK) LOCK FLUSH 100 UNIT/ML IV SOLN
500.0000 [IU] | Freq: Once | INTRAVENOUS | Status: AC | PRN
  Administered 2024-05-06: 500 [IU]

## 2024-05-06 MED ORDER — DEXTROSE 5 % IV SOLN
INTRAVENOUS | Status: DC
Start: 2024-05-06 — End: 2024-05-06

## 2024-05-06 MED ORDER — SODIUM CHLORIDE 0.9% FLUSH
10.0000 mL | Freq: Once | INTRAVENOUS | Status: AC
  Administered 2024-05-06: 10 mL

## 2024-05-06 MED ORDER — SODIUM CHLORIDE 0.9% FLUSH
10.0000 mL | INTRAVENOUS | Status: DC | PRN
  Administered 2024-05-06: 10 mL

## 2024-05-06 NOTE — Progress Notes (Signed)
 Patient Care Team: Hughie Sharper, MD as PCP - General (Family Medicine) Rene, Rocky RAMAN, MD as Referring Physician (Oncology) Tyree Nanetta SAILOR, RN as Oncology Nurse Navigator Glean, Stephane BROCKS, RN (Inactive) as Oncology Nurse Navigator Ebbie Cough, MD as Consulting Physician (General Surgery) Dewey Rush, MD as Consulting Physician (Radiation Oncology) Dolphus Reiter, MD as Consulting Physician (Rheumatology) Lenon Oneil BRAVO, MD as Consulting Physician (Obstetrics and Gynecology) Rolan Ezra RAMAN, MD as Consulting Physician (Cardiology) Armbruster, Elspeth SQUIBB, MD as Consulting Physician (Gastroenterology) Jeannetta Carlin Lenis, MD as Referring Physician (Ophthalmology) Jeannetta Carlin Lenis, MD as Referring Physician (Ophthalmology) Odean Potts, MD as Consulting Physician (Hematology and Oncology) Lucila Rush LABOR, RPH-CPP as Pharmacist (Hematology and Oncology)  DIAGNOSIS:  Encounter Diagnosis  Name Primary?   Malignant neoplasm of lower-outer quadrant of right breast of female, estrogen receptor positive (HCC) Yes    SUMMARY OF ONCOLOGIC HISTORY: Oncology History  Malignant neoplasm of lower-outer quadrant of right breast of female, estrogen receptor positive (HCC)  08/04/2020 Initial Diagnosis   Right breast lower outer quadrant biopsy 08/04/2020 for a clinical T2 N0 M1, stage IV invasive ductal carcinoma, grade 3, triple positive, with an MIB-1 of 40%. abdominal MRI 08/24/2020 shows multiple liver lesions highly suspicious for liver metastases. Liver biopsy 09/20/2020 positive for carcinoma, prognostic panel estrogen and progesterone receptor positive, but HER-2 negative (0); MIB-1 of 30%   08/31/2020 - 12/14/2020 Chemotherapy   Neoadjuvant chemotherapy consisting of docetaxel , carboplatin , trastuzumab  and pertuzumab  every 21 days x 6    12/26/2020 Imaging   breast MRI 12/26/2020 showed right breast index lesion decreased to 0.8 cm; 2 additional masses in the right breast  stable abdominal MRI 01/29/2021 shows no active disease in the liver    Chemotherapy   Herceptin  Maintenance initially q 3 weeks, now q 4 weeks   01/29/2021 -  Anti-estrogen oral therapy   anastrozole  and palbociclib     01/29/2023 Genetic Testing   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  Report date is 01/29/2023.   The Multi-Cancer + RNA Panel offered by [facility] includes sequencing and/or deletion/duplication analysis of the following 70 genes:  AIP*, ALK, APC*, ATM*, AXIN2*, BAP1*, BARD1*, BLM*, BMPR1A*, BRCA1*, BRCA2*, BRIP1*, CDC73*, CDH1*, CDK4, CDKN1B*, CDKN2A, CHEK2*, CTNNA1*, DICER1*, EPCAM (del/dup only), EGFR, FH*, FLCN*, GREM1 (promoter dup only), HOXB13, KIT, LZTR1, MAX*, MBD4, MEN1*, MET, MITF, MLH1*, MSH2*, MSH3*, MSH6*, MUTYH*, NF1*, NF2*, NTHL1*, PALB2*, PDGFRA, PMS2*, POLD1*, POLE*, POT1*, PRKAR1A*, PTCH1*, PTEN*, RAD51C*, RAD51D*, RB1*, RET, SDHA* (sequencing only), SDHAF2*, SDHB*, SDHC*, SDHD*, SMAD4*, SMARCA4*, SMARCB1*, SMARCE1*, STK11*, SUFU*, TMEM127*, TP53*, TSC1*, TSC2*, VHL*. RNA analysis is performed for * genes.   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  Report date is 01/29/2023.   The Multi-Cancer + RNA Panel offered by [facility] includes sequencing and/or deletion/duplication analysis of the following 70 genes:  AIP*, ALK, APC*, ATM*, AXIN2*, BAP1*, BARD1*, BLM*, BMPR1A*, BRCA1*, BRCA2*, BRIP1*, CDC73*, CDH1*, CDK4, CDKN1B*, CDKN2A, CHEK2*, CTNNA1*, DICER1*, EPCAM (del/dup only), EGFR, FH*, FLCN*, GREM1 (promoter dup only), HOXB13, KIT, LZTR1, MAX*, MBD4, MEN1*, MET, MITF,  MLH1*, MSH2*, MSH3*, MSH6*, MUTYH*, NF1*, NF2*, NTHL1*, PALB2*, PDGFRA, PMS2*, POLD1*,  POLE*, POT1*, PRKAR1A*, PTCH1*, PTEN*, RAD51C*, RAD51D*, RB1*, RET, SDHA* (sequencing only), SDHAF2*, SDHB*, SDHC*, SDHD*, SMAD4*, SMARCA4*, SMARCB1*, SMARCE1*, STK11*, SUFU*, TMEM127*, TP53*, TSC1*, TSC2*, VHL*. RNA analysis is performed for * genes.     CHIEF COMPLIANT: Cycle 3 Enhertu   HISTORY OF PRESENT ILLNESS:  History of Present Illness Erica Wood is a 58 year old female with metastatic breast cancer who presents for follow-up after treatment.  Nausea began on the third day after her last chemotherapy treatment and lasted about a week, showing improvement compared to previous episodes. Constipation has improved with the reduction of nausea medication.  She reports increased energy, allowing her to perform activities like washing cars and mowing the grass. Her appetite has improved, and she is consuming small meals three to four times daily, contributing positively to her well-being.  Abdominal pain under the rib has significantly decreased since starting treatment. No new or worsening pain is noted. She experienced a recent gout flare-up affecting her toe but reports no new or worsening pain and improvement in constipation. Nausea is present after treatment, but there are no mouth sores.     ALLERGIES:  is allergic to beef-derived drug products, latex, sulfa antibiotics, and tape.  MEDICATIONS:  Current Outpatient Medications  Medication Sig Dispense Refill   acetaminophen  (TYLENOL ) 500 MG tablet Take 1,000 mg by mouth as needed for moderate pain (pain score 4-6) or headache.     ALPRAZolam (XANAX) 0.25 MG tablet Take 1-2 tabs (0.25mg -0.50mg ) 30-60 minutes before procedure. May repeat if needed.Do not drive. 4 tablet 0   amphetamine -dextroamphetamine (ADDERALL XR) 20 MG 24 hr capsule Take 1 capsule (20 mg total) by mouth daily. 30 capsule 0   Ascorbic Acid (VITAMIN C PO) Take 1,000 mg by  mouth daily.     Cholecalciferol (VITAMIN D3) 75 MCG (3000 UT) TABS Take 3,000 Units by mouth daily.     diclofenac  Sodium (VOLTAREN ) 1 % GEL Research Patient: Apply 0.5 grams (1 fingertip) to each hand and each foot twice daily for up to 12 weeks 400 g 0   estradiol  (ESTRACE ) 0.1 MG/GM vaginal cream INSERT 1 APPLICATORFUL VAGINALLY 3 TIMES A WEEK 42.5 g 12   famotidine (PEPCID) 20 MG tablet Take 20 mg by mouth at bedtime.     hydroxychloroquine  (PLAQUENIL ) 200 MG tablet TAKE 1 TABLET(200 MG) BY MOUTH DAILY 90 tablet 0   lidocaine -prilocaine  (EMLA ) cream Apply to affected area once 30 g 3   Magnesium  Citrate 100 MG TABS Take 1 tablet by mouth daily as needed. 30 tablet 3   minoxidil (LONITEN) 2.5 MG tablet Take 1.25 mg by mouth daily.     ondansetron  (ZOFRAN ) 8 MG tablet Take 1 tablet (8 mg total) by mouth every 8 (eight) hours as needed for nausea or vomiting. 30 tablet 6   oxyCODONE -acetaminophen  (PERCOCET/ROXICET) 5-325 MG tablet Take 1 tablet by mouth every 8 (eight) hours as needed for severe pain (pain score 7-10). 30 tablet 0   prochlorperazine  (COMPAZINE ) 10 MG tablet TAKE 1 TABLET(10 MG) BY MOUTH EVERY 6 HOURS AS NEEDED FOR NAUSEA OR VOMITING 30 tablet 1   spironolactone (ALDACTONE) 25 MG tablet Take 25 mg by mouth daily as needed (swelling).     traMADol  (ULTRAM ) 50 MG tablet Take 1-2 tablets (50-100 mg total) by mouth every 6 (six) hours as needed. 60 tablet 0   tretinoin (RETIN-A) 0.1 % cream Apply 1 application  topically at bedtime as needed (acne).     triamterene -hydrochlorothiazide (DYAZIDE) 37.5-25 MG capsule  TAKE 1 CAPSULE BY MOUTH DAILY 90 capsule 1   warfarin (COUMADIN ) 5 MG tablet TAKE 1 TABLET(5 MG) BY MOUTH DAILY 30 tablet PRN   No current facility-administered medications for this visit.    PHYSICAL EXAMINATION: ECOG PERFORMANCE STATUS: 1 - Symptomatic but completely ambulatory  Vitals:   05/06/24 1419  BP: (!) 150/72  Pulse: 92  Resp: 17  Temp: 97.7 F (36.5 C)   SpO2: 96%   Filed Weights   05/06/24 1419  Weight: 144 lb 3.2 oz (65.4 kg)    Physical Exam MEASUREMENTS: Weight- 114.4 lbs.  (exam performed in the presence of a chaperone)  LABORATORY DATA:  I have reviewed the data as listed    Latest Ref Rng & Units 05/06/2024    1:22 PM 04/14/2024    2:30 PM 04/01/2024   11:07 AM  CMP  Glucose 70 - 99 mg/dL 80  885  91   BUN 6 - 20 mg/dL 18  16  16    Creatinine 0.44 - 1.00 mg/dL 9.15  9.12  9.01   Sodium 135 - 145 mmol/L 137  134  135   Potassium 3.5 - 5.1 mmol/L 3.8  4.0  4.1   Chloride 98 - 111 mmol/L 104  101  100   CO2 22 - 32 mmol/L 28  29  31    Calcium 8.9 - 10.3 mg/dL 8.8  8.9  9.0   Total Protein 6.5 - 8.1 g/dL 6.3  6.6  6.8   Total Bilirubin 0.0 - 1.2 mg/dL 0.4  0.5  0.6   Alkaline Phos 38 - 126 U/L 82  87  90   AST 15 - 41 U/L 30  36  50   ALT 0 - 44 U/L 26  28  35     Lab Results  Component Value Date   WBC 3.9 (L) 05/06/2024   HGB 11.4 (L) 05/06/2024   HCT 33.5 (L) 05/06/2024   MCV 99.4 05/06/2024   PLT 146 (L) 05/06/2024   NEUTROABS 2.0 05/06/2024    ASSESSMENT & PLAN:  Malignant neoplasm of lower-outer quadrant of right breast of female, estrogen receptor positive (HCC) 08/04/2020: Right breast biopsy T2 N0 M1 stage IV IDC grade 3, triple positive with a Ki-67 of 40%, multiple liver lesions biopsy was positive for breast cancer ER/PR positive HER2 negative with a Ki-67 30% 08/31/2020-12/14/2020: TCHP x6 01/29/2021: Letrozole  and palbociclib    11/29/2022-06/05/2023: Olaparib  07/13/2023-03/24/2023: Xeloda      10/24/2022: CT CAP: Multiple new hepatic lesions measuring up to 1.5 cm consistent with metastatic disease, stable 5 mm nodule left costophrenic sulcus 12/05/2021: [facility]: ATM gene mutation (possible treatment option olaparib ), MSI high: Not detected.  Will refer the patient to genetic testing   PET CT scan 03/03/2023: Widespread hypermetabolic metastatic disease within the liver is similar to previous PET/CT  06/02/2023: Interval progression of hepatic metastases.  Progression of bone metastases.  Suspicion of peritoneal disease.   06/13/2023: [facility]: ATM, T p53, FGF R1 amplification, TMB 4.73 mutations 03/30/2024: Right breast biopsy: IDC grade 2, ER 90%, PR 10%, Ki67 40%, HER2 2+ by IHC, FISH positive ratio 2.97 ---------------------------------------------------------------------------------------------------------------------------- Current treatment: Enhertu  cycle 3 Chemo toxicities: Nausea: advised AM Zofran  and Pepcid: I will reduce the dosage of Enhertu  for cycle 2 Fatigue  Echocardiogram 03/24/2024: EF 55 to 60%   The tumor markers have not yet come down.  We are watching closely. Return to clinic in 3 weeks for cycle 4 We discussed the role of ivermectin and cancer  treatment is not based on side effect evidence.  However if they would like to try it, we do not have any issue. ------------------------------------- Assessment and Plan Assessment & Plan Malignant neoplasm of right breast with metastasis to the liver Undergoing treatment with cancer marker previously increased, not always indicative of disease progression. - Order CT scan after third treatment round to assess response. - Monitor cancer marker levels regularly. - Discussed ivermectin use, lack of scientific evidence, potential anecdotal benefits. - Consider ivermectin 9 mg daily if proceeding.  Nausea due to chemotherapy Nausea more manageable, lasting about a week post-treatment, improved with small, frequent meals. - Continue current nausea management, including dietary modifications.       No orders of the defined types were placed in this encounter.  The patient has a good understanding of the overall plan. she agrees with it. she will call with any problems that may develop before the next visit here. Total time spent: 30 mins including face to face time and time spent for planning, charting and  co-ordination of care   Naomi MARLA Chad, MD 05/06/24

## 2024-05-06 NOTE — Patient Instructions (Signed)

## 2024-05-06 NOTE — Assessment & Plan Note (Signed)
 08/04/2020: Right breast biopsy T2 N0 M1 stage IV IDC grade 3, triple positive with a Ki-67 of 40%, multiple liver lesions biopsy was positive for breast cancer ER/PR positive HER2 negative with a Ki-67 30% 08/31/2020-12/14/2020: TCHP x6 01/29/2021: Letrozole  and palbociclib    11/29/2022-06/05/2023: Olaparib  07/13/2023-03/24/2023: Xeloda      10/24/2022: CT CAP: Multiple new hepatic lesions measuring up to 1.5 cm consistent with metastatic disease, stable 5 mm nodule left costophrenic sulcus 12/05/2021: [facility]: ATM gene mutation (possible treatment option olaparib ), MSI high: Not detected.  Will refer the patient to genetic testing   PET CT scan 03/03/2023: Widespread hypermetabolic metastatic disease within the liver is similar to previous PET/CT 06/02/2023: Interval progression of hepatic metastases.  Progression of bone metastases.  Suspicion of peritoneal disease.   06/13/2023: [email or web address]: ATM, T p53, FGF R1 amplification, TMB 4.73 mutations 03/30/2024: Right breast biopsy: IDC grade 2, ER 90%, PR 10%, Ki67 40%, HER2 2+ by IHC, FISH positive ratio 2.97 ---------------------------------------------------------------------------------------------------------------------------- Current treatment: Enhertu  cycle 3 Chemo toxicities: Nausea: advised AM Zofran  and Pepcid: I will reduce the dosage of Enhertu  for cycle 2 Fatigue Thrush: sent diflucan : Resolved Echocardiogram 03/24/2024: EF 55 to 60%   The tumor markers have not yet come down.  We are watching closely. Return to clinic in 3 weeks for cycle 4

## 2024-05-07 ENCOUNTER — Other Ambulatory Visit: Payer: Self-pay

## 2024-05-07 LAB — CANCER ANTIGEN 27.29: CA 27.29: 2534.8 U/mL — ABNORMAL HIGH (ref 0.0–38.6)

## 2024-05-07 NOTE — Telephone Encounter (Signed)
 Last Fill: 04/09/2024  Next Visit: 10/21/2024  Last Visit: 04/08/2024  Dx: SLE (systemic lupus erythematosus related syndrome)   Current Dose per office note on 04/08/2024: Adderall 20mg  capsule by mouth daily   Okay to refill Adderall?

## 2024-05-09 MED ORDER — AMPHETAMINE-DEXTROAMPHET ER 20 MG PO CP24: 20.0000 mg | ORAL_CAPSULE | Freq: Every day | ORAL | 0 refills | Status: DC

## 2024-05-13 ENCOUNTER — Other Ambulatory Visit: Payer: Self-pay | Admitting: *Deleted

## 2024-05-13 MED ORDER — FLUCONAZOLE 100 MG PO TABS: 100.0000 mg | ORAL_TABLET | Freq: Every day | ORAL | 0 refills | Status: AC

## 2024-05-13 NOTE — Progress Notes (Signed)
 Received call from pt with complaint of white spots on her tongue and back of her throat.  Pt states she is having trouble swallowing acidic drinks.  Pt states she has a hx of oral thrush.  RN reviewed with MD and verbal orders received and placed for pt to be prescribed Diflucan  100 mg p.o tablet daily x5 days.  Prescription sent to pharmacy on file, pt educated and verbalized understanding.

## 2024-05-20 ENCOUNTER — Ambulatory Visit (HOSPITAL_COMMUNITY)
Admission: RE | Admit: 2024-05-20 | Discharge: 2024-05-20 | Disposition: A | Discharge status: Home / Self Care | Source: Ambulatory Visit | Attending: Hematology and Oncology | Admitting: Hematology and Oncology

## 2024-05-20 DIAGNOSIS — Z170 Estrogen receptor positive status [ER+]: Secondary | ICD-10-CM | POA: Diagnosis present

## 2024-05-20 DIAGNOSIS — C50511 Malignant neoplasm of lower-outer quadrant of right female breast: Secondary | ICD-10-CM | POA: Insufficient documentation

## 2024-05-20 MED ORDER — IOHEXOL 300 MG/ML  SOLN
100.0000 mL | Freq: Once | INTRAMUSCULAR | Status: AC | PRN
  Administered 2024-05-20: 100 mL via INTRAVENOUS

## 2024-05-20 MED ORDER — SODIUM CHLORIDE (PF) 0.9 % IJ SOLN
INTRAMUSCULAR | Status: AC
Start: 2024-05-20 — End: 2024-05-20
  Filled 2024-05-20: qty 50

## 2024-05-20 MED ORDER — IOHEXOL 9 MG/ML PO SOLN
ORAL | Status: AC
  Filled 2024-05-20: qty 1000

## 2024-05-20 MED ORDER — IOHEXOL 9 MG/ML PO SOLN
1000.0000 mL | ORAL | Status: AC
  Administered 2024-05-20: 1000 mL via ORAL

## 2024-05-21 ENCOUNTER — Telehealth: Payer: Self-pay

## 2024-05-21 NOTE — Telephone Encounter (Signed)
 Notified patient of completion of FMLA forms. Fax transmission confirmation received. Copy of forms placed for pick-up as requested. No other needs or concerns noted at this time.

## 2024-05-26 ENCOUNTER — Ambulatory Visit: Admitting: Physician Assistant

## 2024-05-26 ENCOUNTER — Other Ambulatory Visit

## 2024-05-26 ENCOUNTER — Ambulatory Visit

## 2024-05-26 MED FILL — Fosaprepitant Dimeglumine For IV Infusion 150 MG (Base Eq): INTRAVENOUS | Qty: 5 | Status: AC

## 2024-05-26 NOTE — Assessment & Plan Note (Addendum)
 08/04/2020: Right breast biopsy T2 N0 M1 stage IV IDC grade 3, triple positive with a Ki-67 of 40%, multiple liver lesions biopsy was positive for breast cancer ER/PR positive HER2 negative with a Ki-67 30% 08/31/2020-12/14/2020: TCHP x6 01/29/2021: Letrozole  and palbociclib    11/29/2022-06/05/2023: Olaparib  07/13/2023-03/24/2023: Xeloda      10/24/2022: CT CAP: Multiple new hepatic lesions measuring up to 1.5 cm consistent with metastatic disease, stable 5 mm nodule left costophrenic sulcus 12/05/2021: [email or web address]: ATM gene mutation (possible treatment option olaparib ), MSI high: Not detected.  Will refer the patient to genetic testing   PET CT scan 03/03/2023: Widespread hypermetabolic metastatic disease within the liver is similar to previous PET/CT 06/02/2023: Interval progression of hepatic metastases.  Progression of bone metastases.  Suspicion of peritoneal disease.   06/13/2023: [facility]: ATM, T p53, FGF R1 amplification, TMB 4.73 mutations 03/30/2024: Right breast biopsy: IDC grade 2, ER 90%, PR 10%, Ki67 40%, HER2 2+ by IHC, FISH positive ratio 2.97 ---------------------------------------------------------------------------------------------------------------------------- Current treatment: Enhertu  cycle 4 Chemo toxicities: Nausea: advised AM Zofran  and Pepcid: I will reduce the dosage of Enhertu  for cycle 2 Fatigue   Echocardiogram 03/24/2024: EF 55 to 60%   The tumor markers have not yet come down.  We are watching closely. CT CAP 05/23/24: Increase in size of heaptic mets (1.2 cm became 1.9 cm), improvement of peritoneal mets  Return to clinic in 3 weeks for cycle 4 We discussed the role of ivermectin and cancer treatment is not based on side effect evidence.  However if they would like to try it, we do not have any issue.

## 2024-05-27 ENCOUNTER — Inpatient Hospital Stay

## 2024-05-27 ENCOUNTER — Inpatient Hospital Stay: Attending: Hematology and Oncology

## 2024-05-27 ENCOUNTER — Inpatient Hospital Stay (HOSPITAL_BASED_OUTPATIENT_CLINIC_OR_DEPARTMENT_OTHER): Admitting: Hematology and Oncology

## 2024-05-27 VITALS — BP 114/61 | HR 59 | Temp 97.7°F | Resp 17 | Wt 112.3 lb

## 2024-05-27 DIAGNOSIS — Z5112 Encounter for antineoplastic immunotherapy: Secondary | ICD-10-CM | POA: Insufficient documentation

## 2024-05-27 DIAGNOSIS — C50511 Malignant neoplasm of lower-outer quadrant of right female breast: Secondary | ICD-10-CM

## 2024-05-27 DIAGNOSIS — C7951 Secondary malignant neoplasm of bone: Secondary | ICD-10-CM | POA: Insufficient documentation

## 2024-05-27 DIAGNOSIS — Z79899 Other long term (current) drug therapy: Secondary | ICD-10-CM | POA: Diagnosis not present

## 2024-05-27 DIAGNOSIS — C787 Secondary malignant neoplasm of liver and intrahepatic bile duct: Secondary | ICD-10-CM | POA: Diagnosis present

## 2024-05-27 DIAGNOSIS — Z1721 Progesterone receptor positive status: Secondary | ICD-10-CM | POA: Diagnosis not present

## 2024-05-27 DIAGNOSIS — Z95828 Presence of other vascular implants and grafts: Secondary | ICD-10-CM

## 2024-05-27 DIAGNOSIS — Z170 Estrogen receptor positive status [ER+]: Secondary | ICD-10-CM

## 2024-05-27 DIAGNOSIS — Z1732 Human epidermal growth factor receptor 2 negative status: Secondary | ICD-10-CM | POA: Diagnosis not present

## 2024-05-27 DIAGNOSIS — C786 Secondary malignant neoplasm of retroperitoneum and peritoneum: Secondary | ICD-10-CM | POA: Insufficient documentation

## 2024-05-27 LAB — CBC WITH DIFFERENTIAL (CANCER CENTER ONLY)
Abs Immature Granulocytes: 0.01 K/uL (ref 0.00–0.07)
Basophils Absolute: 0.1 K/uL (ref 0.0–0.1)
Basophils Relative: 2 %
Eosinophils Absolute: 0 K/uL (ref 0.0–0.5)
Eosinophils Relative: 1 %
HCT: 35.4 % — ABNORMAL LOW (ref 36.0–46.0)
Hemoglobin: 12.1 g/dL (ref 12.0–15.0)
Immature Granulocytes: 0 %
Lymphocytes Relative: 35 %
Lymphs Abs: 1.5 K/uL (ref 0.7–4.0)
MCH: 34.1 pg — ABNORMAL HIGH (ref 26.0–34.0)
MCHC: 34.2 g/dL (ref 30.0–36.0)
MCV: 99.7 fL (ref 80.0–100.0)
Monocytes Absolute: 0.5 K/uL (ref 0.1–1.0)
Monocytes Relative: 12 %
Neutro Abs: 2.1 K/uL (ref 1.7–7.7)
Neutrophils Relative %: 50 %
Platelet Count: 162 K/uL (ref 150–400)
RBC: 3.55 MIL/uL — ABNORMAL LOW (ref 3.87–5.11)
RDW: 13.3 % (ref 11.5–15.5)
WBC Count: 4.1 K/uL (ref 4.0–10.5)
nRBC: 0 % (ref 0.0–0.2)

## 2024-05-27 LAB — CMP (CANCER CENTER ONLY)
ALT: 26 U/L (ref 0–44)
AST: 31 U/L (ref 15–41)
Albumin: 3.9 g/dL (ref 3.5–5.0)
Alkaline Phosphatase: 86 U/L (ref 38–126)
Anion gap: 3 — ABNORMAL LOW (ref 5–15)
BUN: 13 mg/dL (ref 6–20)
CO2: 30 mmol/L (ref 22–32)
Calcium: 8.9 mg/dL (ref 8.9–10.3)
Chloride: 105 mmol/L (ref 98–111)
Creatinine: 0.87 mg/dL (ref 0.44–1.00)
GFR, Estimated: >60 mL/min (ref >60)
Glucose, Bld: 79 mg/dL (ref 70–99)
Potassium: 3.9 mmol/L (ref 3.5–5.1)
Sodium: 138 mmol/L (ref 135–145)
Total Bilirubin: 0.4 mg/dL (ref 0.0–1.2)
Total Protein: 6.3 g/dL — ABNORMAL LOW (ref 6.5–8.1)

## 2024-05-27 MED ORDER — PALONOSETRON HCL INJECTION 0.25 MG/5ML
0.2500 mg | Freq: Once | INTRAVENOUS | Status: AC
  Administered 2024-05-27: 0.25 mg via INTRAVENOUS
  Filled 2024-05-27: qty 5

## 2024-05-27 MED ORDER — SODIUM CHLORIDE 0.9% FLUSH
10.0000 mL | INTRAVENOUS | Status: DC | PRN
  Administered 2024-05-27: 10 mL

## 2024-05-27 MED ORDER — FAM-TRASTUZUMAB DERUXTECAN-NXKI CHEMO 100 MG IV SOLR
4.4000 mg/kg | Freq: Once | INTRAVENOUS | Status: AC
  Administered 2024-05-27: 240 mg via INTRAVENOUS
  Filled 2024-05-27: qty 12

## 2024-05-27 MED ORDER — DIPHENHYDRAMINE HCL 25 MG PO CAPS
25.0000 mg | ORAL_CAPSULE | Freq: Once | ORAL | Status: AC
  Administered 2024-05-27: 25 mg via ORAL
  Filled 2024-05-27: qty 1

## 2024-05-27 MED ORDER — ACETAMINOPHEN 325 MG PO TABS
650.0000 mg | ORAL_TABLET | Freq: Once | ORAL | Status: AC
Start: 2024-05-27 — End: 2024-05-27
  Administered 2024-05-27: 650 mg via ORAL
  Filled 2024-05-27: qty 2

## 2024-05-27 MED ORDER — SODIUM CHLORIDE 0.9% FLUSH
10.0000 mL | Freq: Once | INTRAVENOUS | Status: AC
Start: 2024-05-27 — End: 2024-05-27
  Administered 2024-05-27: 10 mL

## 2024-05-27 MED ORDER — HEPARIN SOD (PORK) LOCK FLUSH 100 UNIT/ML IV SOLN
500.0000 [IU] | Freq: Once | INTRAVENOUS | Status: AC | PRN
  Administered 2024-05-27: 500 [IU]

## 2024-05-27 MED ORDER — DEXTROSE 5 % IV SOLN: INTRAVENOUS | Status: DC

## 2024-05-27 MED ORDER — FOSAPREPITANT DIMEGLUMINE INJECTION 150 MG
150.0000 mg | Freq: Once | INTRAVENOUS | Status: AC
  Administered 2024-05-27: 150 mg via INTRAVENOUS
  Filled 2024-05-27: qty 150

## 2024-05-27 NOTE — Patient Instructions (Signed)

## 2024-05-27 NOTE — Progress Notes (Signed)
 Patient Care Team: Hughie Sharper, MD as PCP - General (Family Medicine) Rene, Rocky RAMAN, MD as Referring Physician (Oncology) Tyree Nanetta SAILOR, RN as Oncology Nurse Navigator Glean, Stephane BROCKS, RN (Inactive) as Oncology Nurse Navigator Ebbie Cough, MD as Consulting Physician (General Surgery) Dewey Rush, MD as Consulting Physician (Radiation Oncology) Dolphus Reiter, MD as Consulting Physician (Rheumatology) Lenon Oneil BRAVO, MD as Consulting Physician (Obstetrics and Gynecology) Rolan Ezra RAMAN, MD as Consulting Physician (Cardiology) Armbruster, Elspeth SQUIBB, MD as Consulting Physician (Gastroenterology) Jeannetta Carlin Lenis, MD as Referring Physician (Ophthalmology) Jeannetta Carlin Lenis, MD as Referring Physician (Ophthalmology) Odean Potts, MD as Consulting Physician (Hematology and Oncology) Lucila Rush LABOR, RPH-CPP as Pharmacist (Hematology and Oncology)  DIAGNOSIS:  Encounter Diagnosis  Name Primary?   Malignant neoplasm of lower-outer quadrant of right breast of female, estrogen receptor positive (HCC) Yes    SUMMARY OF ONCOLOGIC HISTORY: Oncology History  Malignant neoplasm of lower-outer quadrant of right breast of female, estrogen receptor positive (HCC)  08/04/2020 Initial Diagnosis   Right breast lower outer quadrant biopsy 08/04/2020 for a clinical T2 N0 M1, stage IV invasive ductal carcinoma, grade 3, triple positive, with an MIB-1 of 40%. abdominal MRI 08/24/2020 shows multiple liver lesions highly suspicious for liver metastases. Liver biopsy 09/20/2020 positive for carcinoma, prognostic panel estrogen and progesterone receptor positive, but HER-2 negative (0); MIB-1 of 30%   08/31/2020 - 12/14/2020 Chemotherapy   Neoadjuvant chemotherapy consisting of docetaxel , carboplatin , trastuzumab  and pertuzumab  every 21 days x 6    12/26/2020 Imaging   breast MRI 12/26/2020 showed right breast index lesion decreased to 0.8 cm; 2 additional masses in the right breast  stable abdominal MRI 01/29/2021 shows no active disease in the liver    Chemotherapy   Herceptin  Maintenance initially q 3 weeks, now q 4 weeks   01/29/2021 -  Anti-estrogen oral therapy   anastrozole  and palbociclib     01/29/2023 Genetic Testing   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  Report date is 01/29/2023.   The Multi-Cancer + RNA Panel offered by [facility] includes sequencing and/or deletion/duplication analysis of the following 70 genes:  AIP*, ALK, APC*, ATM*, AXIN2*, BAP1*, BARD1*, BLM*, BMPR1A*, BRCA1*, BRCA2*, BRIP1*, CDC73*, CDH1*, CDK4, CDKN1B*, CDKN2A, CHEK2*, CTNNA1*, DICER1*, EPCAM (del/dup only), EGFR, FH*, FLCN*, GREM1 (promoter dup only), HOXB13, KIT, LZTR1, MAX*, MBD4, MEN1*, MET, MITF, MLH1*, MSH2*, MSH3*, MSH6*, MUTYH*, NF1*, NF2*, NTHL1*, PALB2*, PDGFRA, PMS2*, POLD1*, POLE*, POT1*, PRKAR1A*, PTCH1*, PTEN*, RAD51C*, RAD51D*, RB1*, RET, SDHA* (sequencing only), SDHAF2*, SDHB*, SDHC*, SDHD*, SMAD4*, SMARCA4*, SMARCB1*, SMARCE1*, STK11*, SUFU*, TMEM127*, TP53*, TSC1*, TSC2*, VHL*. RNA analysis is performed for * genes.   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  Report date is 01/29/2023.   The Multi-Cancer + RNA Panel offered by [facility] includes sequencing and/or deletion/duplication analysis of the following 70 genes:  AIP*, ALK, APC*, ATM*, AXIN2*, BAP1*, BARD1*, BLM*, BMPR1A*, BRCA1*, BRCA2*, BRIP1*, CDC73*, CDH1*, CDK4, CDKN1B*, CDKN2A, CHEK2*, CTNNA1*, DICER1*, EPCAM (del/dup only), EGFR, FH*, FLCN*, GREM1 (promoter dup only), HOXB13, KIT, LZTR1, MAX*, MBD4, MEN1*, MET, MITF,  MLH1*, MSH2*, MSH3*, MSH6*, MUTYH*, NF1*, NF2*, NTHL1*, PALB2*, PDGFRA, PMS2*, POLD1*,  POLE*, POT1*, PRKAR1A*, PTCH1*, PTEN*, RAD51C*, RAD51D*, RB1*, RET, SDHA* (sequencing only), SDHAF2*, SDHB*, SDHC*, SDHD*, SMAD4*, SMARCA4*, SMARCB1*, SMARCE1*, STK11*, SUFU*, TMEM127*, TP53*, TSC1*, TSC2*, VHL*. RNA analysis is performed for * genes.     CHIEF COMPLIANT: Cycle 4 Enhertu   HISTORY OF PRESENT ILLNESS:  History of Present Illness Erica Wood is a 58 year old female with metastatic cancer who presents for follow-up on her current treatment regimen.  Recent imaging shows a slight increase in a liver lesion from 1.2 to 1.6 cm, with overall improvement in other areas. Tumor markers have decreased significantly.  Nausea begins the day after treatment and lasts about a week and a half. She takes anti-nausea medication regularly, with Compazine  used more frequently than Zofran . Previously, nausea lasted about three to three and a half days.  She experiences increased tearing in her eyes, especially when lowering her head, but it is less severe than the blockage experienced with Taxotere . Hair loss continues and is distressing.     ALLERGIES:  is allergic to beef-derived drug products, latex, sulfa antibiotics, and tape.  MEDICATIONS:  Current Outpatient Medications  Medication Sig Dispense Refill   acetaminophen  (TYLENOL ) 500 MG tablet Take 1,000 mg by mouth as needed for moderate pain (pain score 4-6) or headache.     ALPRAZolam  (XANAX ) 0.25 MG tablet Take 1-2 tabs (0.25mg -0.50mg ) 30-60 minutes before procedure. May repeat if needed.Do not drive. 4 tablet 0   amphetamine -dextroamphetamine (ADDERALL XR) 20 MG 24 hr capsule Take 1 capsule (20 mg total) by mouth daily. 30 capsule 0   Ascorbic Acid (VITAMIN C PO) Take 1,000 mg by mouth daily.     Cholecalciferol (VITAMIN D3) 75 MCG (3000 UT) TABS Take 3,000 Units by mouth daily.     diclofenac  Sodium (VOLTAREN ) 1 % GEL Research Patient:  Apply 0.5 grams (1 fingertip) to each hand and each foot twice daily for up to 12 weeks 400 g 0   estradiol  (ESTRACE ) 0.1 MG/GM vaginal cream INSERT 1 APPLICATORFUL VAGINALLY 3 TIMES A WEEK 42.5 g 12   famotidine (PEPCID) 20 MG tablet Take 20 mg by mouth at bedtime.     fluconazole  (DIFLUCAN ) 100 MG tablet Take 1 tablet (100 mg total) by mouth daily. 5 tablet 0   lidocaine -prilocaine  (EMLA ) cream Apply to affected area once 30 g 3   Magnesium  Citrate 100 MG TABS Take 1 tablet by mouth daily as needed. 30 tablet 3   minoxidil (LONITEN) 2.5 MG tablet Take 1.25 mg by mouth daily.     ondansetron  (ZOFRAN ) 8 MG tablet Take 1 tablet (8 mg total) by mouth every 8 (eight) hours as needed for nausea or vomiting. 30 tablet 6   oxyCODONE -acetaminophen  (PERCOCET/ROXICET) 5-325 MG tablet Take 1 tablet by mouth every 8 (eight) hours as needed for severe pain (pain score 7-10). 30 tablet 0   spironolactone (ALDACTONE) 25 MG tablet Take 25 mg by mouth daily as needed (swelling).     traMADol  (ULTRAM ) 50 MG tablet Take 1-2 tablets (50-100 mg total) by mouth every 6 (six) hours as needed. 60 tablet 0   tretinoin (RETIN-A) 0.1 % cream Apply 1 application  topically at bedtime as needed (acne).     triamterene -hydrochlorothiazide (DYAZIDE) 37.5-25 MG capsule TAKE 1 CAPSULE BY MOUTH DAILY 90 capsule 1   warfarin (COUMADIN ) 5 MG tablet TAKE 1 TABLET(5 MG) BY MOUTH DAILY 30 tablet PRN   hydroxychloroquine  (PLAQUENIL ) 200 MG tablet TAKE 1 TABLET(200 MG) BY MOUTH DAILY (Patient not taking: Reported on  05/27/2024) 90 tablet 0   prochlorperazine  (COMPAZINE ) 10 MG tablet TAKE 1 TABLET(10 MG) BY MOUTH EVERY 6 HOURS AS NEEDED FOR NAUSEA OR VOMITING (Patient not taking: Reported on 05/27/2024) 30 tablet 1   No current facility-administered medications for this visit.    PHYSICAL EXAMINATION: ECOG PERFORMANCE STATUS: 1 - Symptomatic but completely ambulatory  Vitals:   05/27/24 1228  BP: 114/61  Pulse: (!) 59  Resp: 17   Temp: 97.7 F (36.5 C)  SpO2: 100%   Filed Weights   05/27/24 1228  Weight: 112 lb 4.8 oz (50.9 kg)      LABORATORY DATA:  I have reviewed the data as listed    Latest Ref Rng & Units 05/27/2024   12:02 PM 05/06/2024    1:22 PM 04/14/2024    2:30 PM  CMP  Glucose 70 - 99 mg/dL 79  80  885   BUN 6 - 20 mg/dL 13  18  16    Creatinine 0.44 - 1.00 mg/dL 9.12  9.15  9.12   Sodium 135 - 145 mmol/L 138  137  134   Potassium 3.5 - 5.1 mmol/L 3.9  3.8  4.0   Chloride 98 - 111 mmol/L 105  104  101   CO2 22 - 32 mmol/L 30  28  29    Calcium 8.9 - 10.3 mg/dL 8.9  8.8  8.9   Total Protein 6.5 - 8.1 g/dL 6.3  6.3  6.6   Total Bilirubin 0.0 - 1.2 mg/dL 0.4  0.4  0.5   Alkaline Phos 38 - 126 U/L 86  82  87   AST 15 - 41 U/L 31  30  36   ALT 0 - 44 U/L 26  26  28      Lab Results  Component Value Date   WBC 4.1 05/27/2024   HGB 12.1 05/27/2024   HCT 35.4 (L) 05/27/2024   MCV 99.7 05/27/2024   PLT 162 05/27/2024   NEUTROABS 2.1 05/27/2024    ASSESSMENT & PLAN:  Malignant neoplasm of lower-outer quadrant of right breast of female, estrogen receptor positive (HCC) 08/04/2020: Right breast biopsy T2 N0 M1 stage IV IDC grade 3, triple positive with a Ki-67 of 40%, multiple liver lesions biopsy was positive for breast cancer ER/PR positive HER2 negative with a Ki-67 30% 08/31/2020-12/14/2020: TCHP x6 01/29/2021: Letrozole  and palbociclib    11/29/2022-06/05/2023: Olaparib  07/13/2023-03/24/2023: Xeloda      10/24/2022: CT CAP: Multiple new hepatic lesions measuring up to 1.5 cm consistent with metastatic disease, stable 5 mm nodule left costophrenic sulcus 12/05/2021: Guardant360: ATM gene mutation (possible treatment option olaparib ), MSI high: Not detected.  Will refer the patient to genetic testing   PET CT scan 03/03/2023: Widespread hypermetabolic metastatic disease within the liver is similar to previous PET/CT 06/02/2023: Interval progression of hepatic metastases.  Progression of bone metastases.   Suspicion of peritoneal disease.   06/13/2023: Guardant360: ATM, T p53, FGF R1 amplification, TMB 4.73 mutations 03/30/2024: Right breast biopsy: IDC grade 2, ER 90%, PR 10%, Ki67 40%, HER2 2+ by IHC, FISH positive ratio 2.97 ---------------------------------------------------------------------------------------------------------------------------- Current treatment: Enhertu  cycle 4 Chemo toxicities: Nausea: advised AM Zofran  and Pepcid: I will reduce the dosage of Enhertu  for cycle 2 Fatigue   Echocardiogram 03/24/2024: EF 55 to 60%   The tumor markers have not yet come down.  We are watching closely. CT CAP 05/23/24: Increase in size of heaptic mets (1.2 cm became 1.9 cm), improvement of peritoneal mets I discussed with the patient that  even though the CT scan showed that the liver metastasis slightly increased in size I do not believe it is enough to count as progression.  Her tumor markers are coming down and the peritoneal carcinomatosis has resolved and therefore I recommended that we continue the treatment for 3 more cycles and obtain a PET CT scan. Return to clinic in 3 weeks for cycle 4   ------------------------------------- Assessment and Plan Assessment & Plan Malignant neoplasm metastatic to liver Liver lesion increased to 1.6 cm, but overall systemic improvement. Tumor markers decreased, indicating positive treatment response. Liver may require more time to respond. - Continue current treatment regimen. - Plan follow-up scan, possibly PET, after three more treatment rounds. - Consider adjusting treatment frequency to every four weeks based on future scan results.  Nausea due to chemotherapy Nausea pronounced post-treatment, lasting 1.5 weeks. Compazine  preferred over Zofran  due to constipation concerns. - Preemptively take anti-nausea medication. - Use Compazine  as needed. - Eat small, frequent meals. - Monitor and adjust management based on nausea patterns.      No  orders of the defined types were placed in this encounter.  The patient has a good understanding of the overall plan. she agrees with it. she will call with any problems that may develop before the next visit here. Total time spent: 45 mins including face to face time and time spent for planning, charting and co-ordination of care   Viinay K Helayna Dun, MD 05/27/24

## 2024-05-28 LAB — CANCER ANTIGEN 27.29: CA 27.29: 1868.8 U/mL — ABNORMAL HIGH (ref 0.0–38.6)

## 2024-06-07 ENCOUNTER — Other Ambulatory Visit: Payer: Self-pay

## 2024-06-07 MED ORDER — AMPHETAMINE-DEXTROAMPHET ER 20 MG PO CP24: 20.0000 mg | ORAL_CAPSULE | Freq: Every day | ORAL | 0 refills | Status: DC

## 2024-06-07 NOTE — Telephone Encounter (Signed)
 Last Fill: 05/09/2024  Next Visit: 10/21/2024  Last Visit: 04/08/2024  Dx: SLE   Current Dose per office note on 04/08/2024: not discussed  Okay to refill Adderall?

## 2024-06-16 MED FILL — Fosaprepitant Dimeglumine For IV Infusion 150 MG (Base Eq): INTRAVENOUS | Qty: 5 | Status: AC

## 2024-06-16 NOTE — Assessment & Plan Note (Signed)
 08/04/2020: Right breast biopsy T2 N0 M1 stage IV IDC grade 3, triple positive with a Ki-67 of 40%, multiple liver lesions biopsy was positive for breast cancer ER/PR positive HER2 negative with a Ki-67 30% 08/31/2020-12/14/2020: TCHP x6 01/29/2021: Letrozole  and palbociclib    11/29/2022-06/05/2023: Olaparib  07/13/2023-03/24/2023: Xeloda      10/24/2022: CT CAP: Multiple new hepatic lesions measuring up to 1.5 cm consistent with metastatic disease, stable 5 mm nodule left costophrenic sulcus 12/05/2021: Guardant360: ATM gene mutation (possible treatment option olaparib ), MSI high: Not detected.  Will refer the patient to genetic testing   PET CT scan 03/03/2023: Widespread hypermetabolic metastatic disease within the liver is similar to previous PET/CT 06/02/2023: Interval progression of hepatic metastases.  Progression of bone metastases.  Suspicion of peritoneal disease.   06/13/2023: Guardant360: ATM, T p53, FGF R1 amplification, TMB 4.73 mutations 03/30/2024: Right breast biopsy: IDC grade 2, ER 90%, PR 10%, Ki67 40%, HER2 2+ by IHC, FISH positive ratio 2.97 ---------------------------------------------------------------------------------------------------------------------------- Current treatment: Enhertu  cycle 5 Chemo toxicities: Nausea: advised AM Zofran  and Pepcid: I will reduce the dosage of Enhertu  for cycle 2 Fatigue   Echocardiogram 03/24/2024: EF 55 to 60%   The tumor markers have not yet come down.  We are watching closely. CT CAP 05/23/24: Increase in size of heaptic mets (1.2 cm became 1.9 cm), improvement of peritoneal mets I discussed with the patient that even though the CT scan showed that the liver metastasis slightly increased in size I do not believe it is enough to count as progression.  Her tumor markers are coming down and the peritoneal carcinomatosis has resolved and therefore I recommended that we continue the treatment for 3 more cycles and obtain a PET CT scan. Return to clinic  in 3 weeks for cycle 5

## 2024-06-17 ENCOUNTER — Inpatient Hospital Stay

## 2024-06-17 ENCOUNTER — Telehealth: Payer: Self-pay

## 2024-06-17 ENCOUNTER — Other Ambulatory Visit: Payer: Self-pay | Admitting: *Deleted

## 2024-06-17 ENCOUNTER — Inpatient Hospital Stay (HOSPITAL_BASED_OUTPATIENT_CLINIC_OR_DEPARTMENT_OTHER): Admitting: Hematology and Oncology

## 2024-06-17 ENCOUNTER — Inpatient Hospital Stay: Attending: Hematology and Oncology

## 2024-06-17 VITALS — BP 110/58 | HR 61 | Temp 98.0°F | Resp 18 | Ht 64.0 in | Wt 112.3 lb

## 2024-06-17 DIAGNOSIS — C787 Secondary malignant neoplasm of liver and intrahepatic bile duct: Secondary | ICD-10-CM | POA: Diagnosis present

## 2024-06-17 DIAGNOSIS — Z5112 Encounter for antineoplastic immunotherapy: Secondary | ICD-10-CM | POA: Insufficient documentation

## 2024-06-17 DIAGNOSIS — Z79899 Other long term (current) drug therapy: Secondary | ICD-10-CM | POA: Diagnosis not present

## 2024-06-17 DIAGNOSIS — Z170 Estrogen receptor positive status [ER+]: Secondary | ICD-10-CM

## 2024-06-17 DIAGNOSIS — C50511 Malignant neoplasm of lower-outer quadrant of right female breast: Secondary | ICD-10-CM

## 2024-06-17 DIAGNOSIS — Z1732 Human epidermal growth factor receptor 2 negative status: Secondary | ICD-10-CM | POA: Insufficient documentation

## 2024-06-17 DIAGNOSIS — C7951 Secondary malignant neoplasm of bone: Secondary | ICD-10-CM | POA: Diagnosis not present

## 2024-06-17 DIAGNOSIS — Z1721 Progesterone receptor positive status: Secondary | ICD-10-CM | POA: Insufficient documentation

## 2024-06-17 DIAGNOSIS — Z95828 Presence of other vascular implants and grafts: Secondary | ICD-10-CM

## 2024-06-17 LAB — CBC WITH DIFFERENTIAL (CANCER CENTER ONLY)
Abs Immature Granulocytes: 0.01 K/uL (ref 0.00–0.07)
Basophils Absolute: 0.1 K/uL (ref 0.0–0.1)
Basophils Relative: 2 %
Eosinophils Absolute: 0 K/uL (ref 0.0–0.5)
Eosinophils Relative: 1 %
HCT: 34.8 % — ABNORMAL LOW (ref 36.0–46.0)
Hemoglobin: 11.8 g/dL — ABNORMAL LOW (ref 12.0–15.0)
Immature Granulocytes: 0 %
Lymphocytes Relative: 36 %
Lymphs Abs: 1.3 K/uL (ref 0.7–4.0)
MCH: 33.5 pg (ref 26.0–34.0)
MCHC: 33.9 g/dL (ref 30.0–36.0)
MCV: 98.9 fL (ref 80.0–100.0)
Monocytes Absolute: 0.4 K/uL (ref 0.1–1.0)
Monocytes Relative: 10 %
Neutro Abs: 1.9 K/uL (ref 1.7–7.7)
Neutrophils Relative %: 51 %
Platelet Count: 160 K/uL (ref 150–400)
RBC: 3.52 MIL/uL — ABNORMAL LOW (ref 3.87–5.11)
RDW: 13.2 % (ref 11.5–15.5)
WBC Count: 3.7 K/uL — ABNORMAL LOW (ref 4.0–10.5)
nRBC: 0 % (ref 0.0–0.2)

## 2024-06-17 LAB — CMP (CANCER CENTER ONLY)
ALT: 32 U/L (ref 0–44)
AST: 34 U/L (ref 15–41)
Albumin: 4 g/dL (ref 3.5–5.0)
Alkaline Phosphatase: 95 U/L (ref 38–126)
Anion gap: 3 — ABNORMAL LOW (ref 5–15)
BUN: 10 mg/dL (ref 6–20)
CO2: 31 mmol/L (ref 22–32)
Calcium: 8.9 mg/dL (ref 8.9–10.3)
Chloride: 104 mmol/L (ref 98–111)
Creatinine: 0.78 mg/dL (ref 0.44–1.00)
GFR, Estimated: >60 mL/min (ref >60)
Glucose, Bld: 84 mg/dL (ref 70–99)
Potassium: 4 mmol/L (ref 3.5–5.1)
Sodium: 138 mmol/L (ref 135–145)
Total Bilirubin: 0.4 mg/dL (ref 0.0–1.2)
Total Protein: 6.3 g/dL — ABNORMAL LOW (ref 6.5–8.1)

## 2024-06-17 MED ORDER — PALONOSETRON HCL INJECTION 0.25 MG/5ML
0.2500 mg | Freq: Once | INTRAVENOUS | Status: AC
  Administered 2024-06-17: 0.25 mg via INTRAVENOUS
  Filled 2024-06-17: qty 5

## 2024-06-17 MED ORDER — FAM-TRASTUZUMAB DERUXTECAN-NXKI CHEMO 100 MG IV SOLR
4.4000 mg/kg | Freq: Once | INTRAVENOUS | Status: AC
  Administered 2024-06-17: 240 mg via INTRAVENOUS
  Filled 2024-06-17: qty 12

## 2024-06-17 MED ORDER — ACETAMINOPHEN 325 MG PO TABS
650.0000 mg | ORAL_TABLET | Freq: Once | ORAL | Status: AC
Start: 2024-06-17 — End: 2024-06-17
  Administered 2024-06-17: 650 mg via ORAL
  Filled 2024-06-17: qty 2

## 2024-06-17 MED ORDER — DEXTROSE 5 % IV SOLN: INTRAVENOUS | Status: DC

## 2024-06-17 MED ORDER — SODIUM CHLORIDE 0.9 % IV SOLN
150.0000 mg | Freq: Once | INTRAVENOUS | Status: AC
  Administered 2024-06-17: 150 mg via INTRAVENOUS
  Filled 2024-06-17: qty 150

## 2024-06-17 MED ORDER — SODIUM CHLORIDE 0.9% FLUSH
10.0000 mL | Freq: Once | INTRAVENOUS | Status: AC
  Administered 2024-06-17: 10 mL

## 2024-06-17 MED ORDER — DIPHENHYDRAMINE HCL 25 MG PO CAPS
25.0000 mg | ORAL_CAPSULE | Freq: Once | ORAL | Status: AC
  Administered 2024-06-17: 25 mg via ORAL
  Filled 2024-06-17: qty 1

## 2024-06-17 NOTE — Progress Notes (Signed)
 Patient Care Team: Hughie Sharper, MD as PCP - General (Family Medicine) Rene, Rocky RAMAN, MD as Referring Physician (Oncology) Tyree Nanetta SAILOR, RN as Oncology Nurse Navigator Glean, Stephane BROCKS, RN (Inactive) as Oncology Nurse Navigator Ebbie Cough, MD as Consulting Physician (General Surgery) Dewey Rush, MD as Consulting Physician (Radiation Oncology) Dolphus Reiter, MD as Consulting Physician (Rheumatology) Lenon Oneil BRAVO, MD as Consulting Physician (Obstetrics and Gynecology) Rolan Ezra RAMAN, MD as Consulting Physician (Cardiology) Armbruster, Elspeth SQUIBB, MD as Consulting Physician (Gastroenterology) Jeannetta Carlin Lenis, MD as Referring Physician (Ophthalmology) Jeannetta Carlin Lenis, MD as Referring Physician (Ophthalmology) Odean Potts, MD as Consulting Physician (Hematology and Oncology) Lucila Rush LABOR, RPH-CPP as Pharmacist (Hematology and Oncology)  DIAGNOSIS:  Encounter Diagnosis  Name Primary?   Malignant neoplasm of lower-outer quadrant of right breast of female, estrogen receptor positive (HCC) Yes    SUMMARY OF ONCOLOGIC HISTORY: Oncology History  Malignant neoplasm of lower-outer quadrant of right breast of female, estrogen receptor positive (HCC)  08/04/2020 Initial Diagnosis   Right breast lower outer quadrant biopsy 08/04/2020 for a clinical T2 N0 M1, stage IV invasive ductal carcinoma, grade 3, triple positive, with an MIB-1 of 40%. abdominal MRI 08/24/2020 shows multiple liver lesions highly suspicious for liver metastases. Liver biopsy 09/20/2020 positive for carcinoma, prognostic panel estrogen and progesterone receptor positive, but HER-2 negative (0); MIB-1 of 30%   08/31/2020 - 12/14/2020 Chemotherapy   Neoadjuvant chemotherapy consisting of docetaxel , carboplatin , trastuzumab  and pertuzumab  every 21 days x 6    12/26/2020 Imaging   breast MRI 12/26/2020 showed right breast index lesion decreased to 0.8 cm; 2 additional masses in the right breast  stable abdominal MRI 01/29/2021 shows no active disease in the liver    Chemotherapy   Herceptin  Maintenance initially q 3 weeks, now q 4 weeks   01/29/2021 -  Anti-estrogen oral therapy   anastrozole  and palbociclib     01/29/2023 Genetic Testing   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  Report date is 01/29/2023.   The Multi-Cancer + RNA Panel offered by [facility] includes sequencing and/or deletion/duplication analysis of the following 70 genes:  AIP*, ALK, APC*, ATM*, AXIN2*, BAP1*, BARD1*, BLM*, BMPR1A*, BRCA1*, BRCA2*, BRIP1*, CDC73*, CDH1*, CDK4, CDKN1B*, CDKN2A, CHEK2*, CTNNA1*, DICER1*, EPCAM (del/dup only), EGFR, FH*, FLCN*, GREM1 (promoter dup only), HOXB13, KIT, LZTR1, MAX*, MBD4, MEN1*, MET, MITF, MLH1*, MSH2*, MSH3*, MSH6*, MUTYH*, NF1*, NF2*, NTHL1*, PALB2*, PDGFRA, PMS2*, POLD1*, POLE*, POT1*, PRKAR1A*, PTCH1*, PTEN*, RAD51C*, RAD51D*, RB1*, RET, SDHA* (sequencing only), SDHAF2*, SDHB*, SDHC*, SDHD*, SMAD4*, SMARCA4*, SMARCB1*, SMARCE1*, STK11*, SUFU*, TMEM127*, TP53*, TSC1*, TSC2*, VHL*. RNA analysis is performed for * genes.   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  Report date is 01/29/2023.   The Multi-Cancer + RNA Panel offered by [facility] includes sequencing and/or deletion/duplication analysis of the following 70 genes:  AIP*, ALK, APC*, ATM*, AXIN2*, BAP1*, BARD1*, BLM*, BMPR1A*, BRCA1*, BRCA2*, BRIP1*, CDC73*, CDH1*, CDK4, CDKN1B*, CDKN2A, CHEK2*, CTNNA1*, DICER1*, EPCAM (del/dup only), EGFR, FH*, FLCN*, GREM1 (promoter dup only), HOXB13, KIT, LZTR1, MAX*, MBD4, MEN1*, MET, MITF,  MLH1*, MSH2*, MSH3*, MSH6*, MUTYH*, NF1*, NF2*, NTHL1*, PALB2*, PDGFRA, PMS2*, POLD1*,  POLE*, POT1*, PRKAR1A*, PTCH1*, PTEN*, RAD51C*, RAD51D*, RB1*, RET, SDHA* (sequencing only), SDHAF2*, SDHB*, SDHC*, SDHD*, SMAD4*, SMARCA4*, SMARCB1*, SMARCE1*, STK11*, SUFU*, TMEM127*, TP53*, TSC1*, TSC2*, VHL*. RNA analysis is performed for * genes.     CHIEF COMPLIANT: Cycle 5 Enhertu   HISTORY OF PRESENT ILLNESS:   History of Present Illness Erica Wood is a 58 year old female undergoing treatment for breast cancer with Enhertu  who presents for cycle 5.  She complains of nausea for 4 to 5 days after each chemo    Nausea occurs unpredictably three to four days after chemotherapy. Ondansetron  effectively manages her symptoms when taken at the onset of nausea. She is currently undergoing her fifth round of chemotherapy for breast cancer.     ALLERGIES:  is allergic to beef-derived drug products, latex, sulfa antibiotics, and tape.  MEDICATIONS:  Current Outpatient Medications  Medication Sig Dispense Refill   acetaminophen  (TYLENOL ) 500 MG tablet Take 1,000 mg by mouth as needed for moderate pain (pain score 4-6) or headache.     ALPRAZolam  (XANAX ) 0.25 MG tablet Take 1-2 tabs (0.25mg -0.50mg ) 30-60 minutes before procedure. May repeat if needed.Do not drive. 4 tablet 0   amphetamine -dextroamphetamine (ADDERALL XR) 20 MG 24 hr capsule Take 1 capsule (20 mg total) by mouth daily. 30 capsule 0   Ascorbic Acid (VITAMIN C PO) Take 1,000 mg by mouth daily.     Cholecalciferol (VITAMIN D3) 75 MCG (3000 UT) TABS Take 3,000 Units by mouth daily.     diclofenac  Sodium (VOLTAREN ) 1 % GEL Research Patient: Apply 0.5 grams (1 fingertip) to each hand and each foot twice daily for up to 12 weeks 400 g 0   estradiol  (ESTRACE ) 0.1 MG/GM vaginal cream INSERT 1 APPLICATORFUL VAGINALLY 3 TIMES A WEEK 42.5 g 12   famotidine (PEPCID) 20 MG tablet Take 20 mg by mouth at bedtime.     fluconazole  (DIFLUCAN ) 100 MG  tablet Take 1 tablet (100 mg total) by mouth daily. 5 tablet 0   fluorometholone (FML) 0.1 % ophthalmic suspension 1 drop 2 (two) times daily.     hydroxychloroquine  (PLAQUENIL ) 200 MG tablet TAKE 1 TABLET(200 MG) BY MOUTH DAILY 90 tablet 0   lidocaine -prilocaine  (EMLA ) cream Apply to affected area once 30 g 3   Magnesium  Citrate 100 MG TABS Take 1 tablet by mouth daily as needed. 30 tablet 3   minoxidil (LONITEN) 2.5 MG tablet Take 1.25 mg by mouth daily.     ondansetron  (ZOFRAN ) 8 MG tablet Take 1 tablet (8 mg total) by mouth every 8 (eight) hours as needed for nausea or vomiting. 30 tablet 6   oxyCODONE -acetaminophen  (PERCOCET/ROXICET) 5-325 MG tablet Take 1 tablet by mouth every 8 (eight) hours as needed for severe pain (pain score 7-10). 30 tablet 0   spironolactone (ALDACTONE) 25 MG tablet Take 25 mg by mouth daily as needed (swelling).     traMADol  (ULTRAM ) 50 MG tablet Take 1-2 tablets (50-100 mg total) by mouth every 6 (six) hours as needed. 60 tablet 0   tretinoin (RETIN-A) 0.1 % cream Apply 1 application  topically at bedtime as needed (acne).     triamterene -hydrochlorothiazide (DYAZIDE) 37.5-25 MG capsule TAKE 1 CAPSULE BY MOUTH DAILY 90 capsule 1   warfarin (COUMADIN ) 5 MG tablet TAKE 1 TABLET(5 MG) BY MOUTH DAILY 30 tablet PRN   prochlorperazine  (COMPAZINE ) 10 MG tablet TAKE 1 TABLET(10 MG) BY MOUTH EVERY 6 HOURS AS NEEDED FOR NAUSEA OR VOMITING (Patient not taking: Reported on 06/17/2024) 30 tablet 1  No current facility-administered medications for this visit.    PHYSICAL EXAMINATION: ECOG PERFORMANCE STATUS: 1 - Symptomatic but completely ambulatory  Vitals:   06/17/24 1400  BP: (!) 110/58  Pulse: 61  Resp: 18  Temp: 98 F (36.7 C)  SpO2: 99%   Filed Weights   06/17/24 1400  Weight: 112 lb 4.8 oz (50.9 kg)      LABORATORY DATA:  I have reviewed the data as listed    Latest Ref Rng & Units 05/27/2024   12:02 PM 05/06/2024    1:22 PM 04/14/2024    2:30 PM  CMP   Glucose 70 - 99 mg/dL 79  80  885   BUN 6 - 20 mg/dL 13  18  16    Creatinine 0.44 - 1.00 mg/dL 9.12  9.15  9.12   Sodium 135 - 145 mmol/L 138  137  134   Potassium 3.5 - 5.1 mmol/L 3.9  3.8  4.0   Chloride 98 - 111 mmol/L 105  104  101   CO2 22 - 32 mmol/L 30  28  29    Calcium 8.9 - 10.3 mg/dL 8.9  8.8  8.9   Total Protein 6.5 - 8.1 g/dL 6.3  6.3  6.6   Total Bilirubin 0.0 - 1.2 mg/dL 0.4  0.4  0.5   Alkaline Phos 38 - 126 U/L 86  82  87   AST 15 - 41 U/L 31  30  36   ALT 0 - 44 U/L 26  26  28      Lab Results  Component Value Date   WBC 3.7 (L) 06/17/2024   HGB 11.8 (L) 06/17/2024   HCT 34.8 (L) 06/17/2024   MCV 98.9 06/17/2024   PLT 160 06/17/2024   NEUTROABS 1.9 06/17/2024    ASSESSMENT & PLAN:  Malignant neoplasm of lower-outer quadrant of right breast of female, estrogen receptor positive (HCC) 08/04/2020: Right breast biopsy T2 N0 M1 stage IV IDC grade 3, triple positive with a Ki-67 of 40%, multiple liver lesions biopsy was positive for breast cancer ER/PR positive HER2 negative with a Ki-67 30% 08/31/2020-12/14/2020: TCHP x6 01/29/2021: Letrozole  and palbociclib    11/29/2022-06/05/2023: Olaparib  07/13/2023-03/24/2023: Xeloda      10/24/2022: CT CAP: Multiple new hepatic lesions measuring up to 1.5 cm consistent with metastatic disease, stable 5 mm nodule left costophrenic sulcus 12/05/2021: Guardant360: ATM gene mutation (possible treatment option olaparib ), MSI high: Not detected.  Will refer the patient to genetic testing   PET CT scan 03/03/2023: Widespread hypermetabolic metastatic disease within the liver is similar to previous PET/CT 06/02/2023: Interval progression of hepatic metastases.  Progression of bone metastases.  Suspicion of peritoneal disease.   06/13/2023: Guardant360: ATM, T p53, FGF R1 amplification, TMB 4.73 mutations 03/30/2024: Right breast biopsy: IDC grade 2, ER 90%, PR 10%, Ki67 40%, HER2 2+ by IHC, FISH positive ratio  2.97 ---------------------------------------------------------------------------------------------------------------------------- Current treatment: Enhertu  cycle 5 Chemo toxicities: Nausea: advised AM Zofran  and Pepcid: I will reduce the dosage of Enhertu  for cycle 2 Fatigue   Echocardiogram 03/24/2024: EF 55 to 60%   The tumor markers have not yet come down.  We are watching closely. CT CAP 05/23/24: Increase in size of heaptic mets (1.2 cm became 1.9 cm), improvement of peritoneal mets I discussed with the patient that even though the CT scan showed that the liver metastasis slightly increased in size I do not believe it is enough to count as progression.  Her tumor markers are coming down and the peritoneal  carcinomatosis has resolved and therefore I recommended that we continue the treatment for 3 more cycles and obtain a PET CT scan. Return to clinic in 3 weeks for cycle 5 Next scan will be done in September ------------------------------------- Assessment and Plan Assessment & Plan Metastatic ER-positive right breast cancer with liver involvement Undergoing fifth chemotherapy cycle. Hematologic parameters stable. Liver discomfort likely from metastases. - Complete six chemotherapy cycles and reassess with imaging. - Order echocardiogram this month. - Schedule imaging scan on September 11th post-sixth cycle.  Chemotherapy-induced nausea Nausea occurs three to four days post-chemotherapy. Ondansetron  effective as needed. - Advise ondansetron  preventively for two to three days starting on the third day post-chemotherapy.      No orders of the defined types were placed in this encounter.  The patient has a good understanding of the overall plan. she agrees with it. she will call with any problems that may develop before the next visit here. Total time spent: 30 mins including face to face time and time spent for planning, charting and co-ordination of care   Viinay K Ilham Roughton,  MD 06/17/24

## 2024-06-17 NOTE — Telephone Encounter (Signed)
 Pt came in to pick up her FMLA forms. No questions or concerns to be noted.

## 2024-06-18 LAB — CANCER ANTIGEN 27.29: CA 27.29: 1268.4 U/mL — ABNORMAL HIGH (ref 0.0–38.6)

## 2024-06-30 ENCOUNTER — Ambulatory Visit (HOSPITAL_COMMUNITY)
Admission: RE | Admit: 2024-06-30 | Discharge: 2024-06-30 | Disposition: A | Discharge status: Home / Self Care | Source: Ambulatory Visit | Attending: Hematology and Oncology | Admitting: Hematology and Oncology

## 2024-06-30 DIAGNOSIS — I082 Rheumatic disorders of both aortic and tricuspid valves: Secondary | ICD-10-CM | POA: Insufficient documentation

## 2024-06-30 DIAGNOSIS — Z5181 Encounter for therapeutic drug level monitoring: Secondary | ICD-10-CM | POA: Diagnosis not present

## 2024-06-30 DIAGNOSIS — Z79899 Other long term (current) drug therapy: Secondary | ICD-10-CM | POA: Diagnosis not present

## 2024-06-30 LAB — ECHOCARDIOGRAM COMPLETE
Area-P 1/2: 4.21 cm2
Calc EF: 62.6 %
S' Lateral: 3.6 cm
Single Plane A2C EF: 61.5 %
Single Plane A4C EF: 64.2 %

## 2024-07-07 MED FILL — Fosaprepitant Dimeglumine For IV Infusion 150 MG (Base Eq): INTRAVENOUS | Qty: 5 | Status: AC

## 2024-07-07 NOTE — Assessment & Plan Note (Signed)
 08/04/2020: Right breast biopsy T2 N0 M1 stage IV IDC grade 3, triple positive with a Ki-67 of 40%, multiple liver lesions biopsy was positive for breast cancer ER/PR positive HER2 negative with a Ki-67 30% 08/31/2020-12/14/2020: TCHP x6 01/29/2021: Letrozole  and palbociclib    11/29/2022-06/05/2023: Olaparib  07/13/2023-03/24/2023: Xeloda      10/24/2022: CT CAP: Multiple new hepatic lesions measuring up to 1.5 cm consistent with metastatic disease, stable 5 mm nodule left costophrenic sulcus 12/05/2021: Guardant360: ATM gene mutation (possible treatment option olaparib ), MSI high: Not detected.  Will refer the patient to genetic testing   PET CT scan 03/03/2023: Widespread hypermetabolic metastatic disease within the liver is similar to previous PET/CT 06/02/2023: Interval progression of hepatic metastases.  Progression of bone metastases.  Suspicion of peritoneal disease.   06/13/2023: Guardant[facility]: ATM, T p53, FGF R1 amplification, TMB 4.73 mutations 03/30/2024: Right breast biopsy: IDC grade 2, ER 90%, PR 10%, Ki67 40%, HER2 2+ by IHC, FISH positive ratio 2.97 ---------------------------------------------------------------------------------------------------------------------------- Current treatment: Enhertu  cycle 6 Chemo toxicities: Nausea: advised AM Zofran  and Pepcid: I will reduce the dosage of Enhertu  for cycle 2 Fatigue   Echocardiogram 03/24/2024: EF 55 to 60%   Component Ref Range & Units (hover) 2 wk ago (06/17/24) 1 mo ago (05/27/24) 2 mo ago (05/06/24) 2 mo ago (04/14/24) 3 mo ago (04/01/24) 3 mo ago (03/23/24) 5 mo ago (02/05/24)  CA 27.29 1,268.4 High  1,868.8 High  CM 2,534.8 High  CM 3,700.4 High  CM 3,507.1 High  CM 3,141.7 High  CM    CT CAP 05/23/24: Increase in size of heaptic mets (1.2 cm became 1.9 cm), improvement of peritoneal mets I discussed with the patient that even though the CT scan showed that the liver metastasis slightly increased in size I do not believe it is enough to  count as progression.  Her tumor markers are coming down and the peritoneal carcinomatosis has resolved and therefore I recommended that we continue the treatment for 3 more cycles and obtain a PET CT scan. Return to clinic in 3 weeks for cycle 7 Next scan will be done in September

## 2024-07-08 ENCOUNTER — Inpatient Hospital Stay

## 2024-07-08 ENCOUNTER — Inpatient Hospital Stay: Admitting: Adult Health

## 2024-07-08 ENCOUNTER — Inpatient Hospital Stay (HOSPITAL_BASED_OUTPATIENT_CLINIC_OR_DEPARTMENT_OTHER): Admitting: Hematology and Oncology

## 2024-07-08 ENCOUNTER — Other Ambulatory Visit: Payer: Self-pay | Admitting: *Deleted

## 2024-07-08 VITALS — BP 128/55 | HR 64 | Temp 97.6°F | Resp 17 | Wt 113.3 lb

## 2024-07-08 DIAGNOSIS — C50511 Malignant neoplasm of lower-outer quadrant of right female breast: Secondary | ICD-10-CM

## 2024-07-08 DIAGNOSIS — Z95828 Presence of other vascular implants and grafts: Secondary | ICD-10-CM

## 2024-07-08 DIAGNOSIS — C787 Secondary malignant neoplasm of liver and intrahepatic bile duct: Secondary | ICD-10-CM | POA: Diagnosis not present

## 2024-07-08 DIAGNOSIS — Z170 Estrogen receptor positive status [ER+]: Secondary | ICD-10-CM | POA: Diagnosis not present

## 2024-07-08 DIAGNOSIS — Z5112 Encounter for antineoplastic immunotherapy: Secondary | ICD-10-CM | POA: Diagnosis not present

## 2024-07-08 LAB — CMP (CANCER CENTER ONLY)
ALT: 21 U/L (ref 0–44)
AST: 27 U/L (ref 15–41)
Albumin: 3.8 g/dL (ref 3.5–5.0)
Alkaline Phosphatase: 90 U/L (ref 38–126)
Anion gap: 3 — ABNORMAL LOW (ref 5–15)
BUN: 11 mg/dL (ref 6–20)
CO2: 30 mmol/L (ref 22–32)
Calcium: 8.8 mg/dL — ABNORMAL LOW (ref 8.9–10.3)
Chloride: 104 mmol/L (ref 98–111)
Creatinine: 0.86 mg/dL (ref 0.44–1.00)
GFR, Estimated: >60 mL/min (ref >60)
Glucose, Bld: 80 mg/dL (ref 70–99)
Potassium: 3.9 mmol/L (ref 3.5–5.1)
Sodium: 137 mmol/L (ref 135–145)
Total Bilirubin: 0.3 mg/dL (ref 0.0–1.2)
Total Protein: 6 g/dL — ABNORMAL LOW (ref 6.5–8.1)

## 2024-07-08 LAB — CBC WITH DIFFERENTIAL (CANCER CENTER ONLY)
Abs Immature Granulocytes: 0 K/uL (ref 0.00–0.07)
Basophils Absolute: 0.1 K/uL (ref 0.0–0.1)
Basophils Relative: 2 %
Eosinophils Absolute: 0.1 K/uL (ref 0.0–0.5)
Eosinophils Relative: 1 %
HCT: 33.8 % — ABNORMAL LOW (ref 36.0–46.0)
Hemoglobin: 11.5 g/dL — ABNORMAL LOW (ref 12.0–15.0)
Immature Granulocytes: 0 %
Lymphocytes Relative: 35 %
Lymphs Abs: 1.2 K/uL (ref 0.7–4.0)
MCH: 33.7 pg (ref 26.0–34.0)
MCHC: 34 g/dL (ref 30.0–36.0)
MCV: 99.1 fL (ref 80.0–100.0)
Monocytes Absolute: 0.4 K/uL (ref 0.1–1.0)
Monocytes Relative: 12 %
Neutro Abs: 1.8 K/uL (ref 1.7–7.7)
Neutrophils Relative %: 50 %
Platelet Count: 138 K/uL — ABNORMAL LOW (ref 150–400)
RBC: 3.41 MIL/uL — ABNORMAL LOW (ref 3.87–5.11)
RDW: 13.1 % (ref 11.5–15.5)
WBC Count: 3.5 K/uL — ABNORMAL LOW (ref 4.0–10.5)
nRBC: 0 % (ref 0.0–0.2)

## 2024-07-08 MED ORDER — PALONOSETRON HCL INJECTION 0.25 MG/5ML
0.2500 mg | Freq: Once | INTRAVENOUS | Status: AC
  Administered 2024-07-08: 0.25 mg via INTRAVENOUS
  Filled 2024-07-08: qty 5

## 2024-07-08 MED ORDER — FAM-TRASTUZUMAB DERUXTECAN-NXKI CHEMO 100 MG IV SOLR
4.4000 mg/kg | Freq: Once | INTRAVENOUS | Status: AC
  Administered 2024-07-08: 240 mg via INTRAVENOUS
  Filled 2024-07-08: qty 12

## 2024-07-08 MED ORDER — SODIUM CHLORIDE 0.9 % IV SOLN
150.0000 mg | Freq: Once | INTRAVENOUS | Status: AC
  Administered 2024-07-08: 150 mg via INTRAVENOUS
  Filled 2024-07-08: qty 150

## 2024-07-08 MED ORDER — AMPHETAMINE-DEXTROAMPHET ER 20 MG PO CP24: 20.0000 mg | ORAL_CAPSULE | Freq: Every day | ORAL | 0 refills | Status: DC

## 2024-07-08 MED ORDER — DEXTROSE 5 % IV SOLN: INTRAVENOUS | Status: DC

## 2024-07-08 MED ORDER — ACETAMINOPHEN 325 MG PO TABS
650.0000 mg | ORAL_TABLET | Freq: Once | ORAL | Status: AC
  Administered 2024-07-08: 650 mg via ORAL
  Filled 2024-07-08: qty 2

## 2024-07-08 MED ORDER — PROCHLORPERAZINE MALEATE 10 MG PO TABS: 10.0000 mg | ORAL_TABLET | Freq: Three times a day (TID) | ORAL | 6 refills | Status: DC | PRN

## 2024-07-08 MED ORDER — SODIUM CHLORIDE 0.9% FLUSH
10.0000 mL | Freq: Once | INTRAVENOUS | Status: AC
  Administered 2024-07-08: 10 mL

## 2024-07-08 MED ORDER — SODIUM CHLORIDE 0.9% FLUSH: 10.0000 mL | INTRAVENOUS | Status: DC | PRN

## 2024-07-08 MED ORDER — DIPHENHYDRAMINE HCL 25 MG PO CAPS
25.0000 mg | ORAL_CAPSULE | Freq: Once | ORAL | Status: AC
  Administered 2024-07-08: 25 mg via ORAL
  Filled 2024-07-08: qty 1

## 2024-07-08 NOTE — Telephone Encounter (Signed)
 Patient contacted the office requesting a refill of Adderall to be sent to the [facility]  Last Fill: 06/07/2024  Next Visit: 10/21/2024  Last Visit: 04/08/2024  Dx: SLE (systemic lupus erythematosus related syndrome)   Current Dose per office note on 04/08/2024: not discussed  Okay to refill Adderall?

## 2024-07-08 NOTE — Progress Notes (Signed)
 Patient Care Team: Hughie Sharper, MD as PCP - General (Family Medicine) Rene, Rocky RAMAN, MD as Referring Physician (Oncology) Tyree Nanetta SAILOR, RN as Oncology Nurse Navigator Glean, Stephane BROCKS, RN (Inactive) as Oncology Nurse Navigator Ebbie Cough, MD as Consulting Physician (General Surgery) Dewey Rush, MD as Consulting Physician (Radiation Oncology) Dolphus Reiter, MD as Consulting Physician (Rheumatology) Lenon Oneil BRAVO, MD as Consulting Physician (Obstetrics and Gynecology) Rolan Ezra RAMAN, MD as Consulting Physician (Cardiology) Armbruster, Elspeth SQUIBB, MD as Consulting Physician (Gastroenterology) Jeannetta Carlin Lenis, MD as Referring Physician (Ophthalmology) Jeannetta Carlin Lenis, MD as Referring Physician (Ophthalmology) Odean Potts, MD as Consulting Physician (Hematology and Oncology) Lucila Rush LABOR, RPH-CPP as Pharmacist (Hematology and Oncology)  DIAGNOSIS:  Encounter Diagnosis  Name Primary?   Malignant neoplasm of lower-outer quadrant of right breast of female, estrogen receptor positive (HCC) Yes    SUMMARY OF ONCOLOGIC HISTORY: Oncology History  Malignant neoplasm of lower-outer quadrant of right breast of female, estrogen receptor positive (HCC)  08/04/2020 Initial Diagnosis   Right breast lower outer quadrant biopsy 08/04/2020 for a clinical T2 N0 M1, stage IV invasive ductal carcinoma, grade 3, triple positive, with an MIB-1 of 40%. abdominal MRI 08/24/2020 shows multiple liver lesions highly suspicious for liver metastases. Liver biopsy 09/20/2020 positive for carcinoma, prognostic panel estrogen and progesterone receptor positive, but HER-2 negative (0); MIB-1 of 30%   08/31/2020 - 12/14/2020 Chemotherapy   Neoadjuvant chemotherapy consisting of docetaxel , carboplatin , trastuzumab  and pertuzumab  every 21 days x 6    12/26/2020 Imaging   breast MRI 12/26/2020 showed right breast index lesion decreased to 0.8 cm; 2 additional masses in the right breast  stable abdominal MRI 01/29/2021 shows no active disease in the liver    Chemotherapy   Herceptin  Maintenance initially q 3 weeks, now q 4 weeks   01/29/2021 -  Anti-estrogen oral therapy   anastrozole  and palbociclib     01/29/2023 Genetic Testing   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  Report date is 01/29/2023.   The Multi-Cancer + RNA Panel offered by [facility] includes sequencing and/or deletion/duplication analysis of the following 70 genes:  AIP*, ALK, APC*, ATM*, AXIN2*, BAP1*, BARD1*, BLM*, BMPR1A*, BRCA1*, BRCA2*, BRIP1*, CDC73*, CDH1*, CDK4, CDKN1B*, CDKN2A, CHEK2*, CTNNA1*, DICER1*, EPCAM (del/dup only), EGFR, FH*, FLCN*, GREM1 (promoter dup only), HOXB13, KIT, LZTR1, MAX*, MBD4, MEN1*, MET, MITF, MLH1*, MSH2*, MSH3*, MSH6*, MUTYH*, NF1*, NF2*, NTHL1*, PALB2*, PDGFRA, PMS2*, POLD1*, POLE*, POT1*, PRKAR1A*, PTCH1*, PTEN*, RAD51C*, RAD51D*, RB1*, RET, SDHA* (sequencing only), SDHAF2*, SDHB*, SDHC*, SDHD*, SMAD4*, SMARCA4*, SMARCB1*, SMARCE1*, STK11*, SUFU*, TMEM127*, TP53*, TSC1*, TSC2*, VHL*. RNA analysis is performed for * genes.   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  Report date is 01/29/2023.   The Multi-Cancer + RNA Panel offered by [facility] includes sequencing and/or deletion/duplication analysis of the following 70 genes:  AIP*, ALK, APC*, ATM*, AXIN2*, BAP1*, BARD1*, BLM*, BMPR1A*, BRCA1*, BRCA2*, BRIP1*, CDC73*, CDH1*, CDK4, CDKN1B*, CDKN2A, CHEK2*, CTNNA1*, DICER1*, EPCAM (del/dup only), EGFR, FH*, FLCN*, GREM1 (promoter dup only), HOXB13, KIT, LZTR1, MAX*, MBD4, MEN1*, MET, MITF,  MLH1*, MSH2*, MSH3*, MSH6*, MUTYH*, NF1*, NF2*, NTHL1*, PALB2*, PDGFRA, PMS2*, POLD1*,  POLE*, POT1*, PRKAR1A*, PTCH1*, PTEN*, RAD51C*, RAD51D*, RB1*, RET, SDHA* (sequencing only), SDHAF2*, SDHB*, SDHC*, SDHD*, SMAD4*, SMARCA4*, SMARCB1*, SMARCE1*, STK11*, SUFU*, TMEM127*, TP53*, TSC1*, TSC2*, VHL*. RNA analysis is performed for * genes.     CHIEF COMPLIANT: Cycle 6 Enhertu   HISTORY OF PRESENT ILLNESS:   History of Present Illness Erica Wood is a 58 year old female with metastatic breast cancer who presents for cycle 6 of Enhertu  and follow-up of her tumor markers    Her recent tumor markers show a significant decrease, with the CA 27-29 marker dropping from 3700 to 1268. Recent blood work indicates white cells at 4, hemoglobin at 11.5, and platelets at 138.  She experiences some days of nausea, which has improved compared to previous episodes. She is taking Compazine  for nausea and requests a refill.     ALLERGIES:  is allergic to beef-derived drug products, latex, sulfa antibiotics, and tape.  MEDICATIONS:  Current Outpatient Medications  Medication Sig Dispense Refill   acetaminophen  (TYLENOL ) 500 MG tablet Take 1,000 mg by mouth as needed for moderate pain (pain score 4-6) or headache.     ALPRAZolam  (XANAX ) 0.25 MG tablet Take 1-2 tabs (0.25mg -0.50mg ) 30-60 minutes before procedure. May repeat if needed.Do not drive. 4 tablet 0   amphetamine -dextroamphetamine (ADDERALL XR) 20 MG 24 hr capsule Take 1 capsule (20 mg total) by mouth daily. 30 capsule 0   Ascorbic Acid (VITAMIN C PO) Take 1,000 mg by mouth daily.     Cholecalciferol (VITAMIN D3) 75 MCG (3000 UT) TABS Take 3,000 Units by mouth daily.     diclofenac  Sodium (VOLTAREN ) 1 % GEL Research Patient: Apply 0.5 grams (1 fingertip) to each hand and each foot twice daily for up to 12 weeks 400 g 0   estradiol  (ESTRACE ) 0.1 MG/GM vaginal cream INSERT 1 APPLICATORFUL VAGINALLY 3 TIMES A WEEK 42.5 g 12   famotidine (PEPCID) 20  MG tablet Take 20 mg by mouth at bedtime.     fluconazole  (DIFLUCAN ) 100 MG tablet Take 1 tablet (100 mg total) by mouth daily. 5 tablet 0   fluorometholone (FML) 0.1 % ophthalmic suspension 1 drop 2 (two) times daily.     hydroxychloroquine  (PLAQUENIL ) 200 MG tablet TAKE 1 TABLET(200 MG) BY MOUTH DAILY 90 tablet 0   lidocaine -prilocaine  (EMLA ) cream Apply to affected area once 30 g 3   Magnesium  Citrate 100 MG TABS Take 1 tablet by mouth daily as needed. 30 tablet 3   minoxidil (LONITEN) 2.5 MG tablet Take 1.25 mg by mouth daily.     ondansetron  (ZOFRAN ) 8 MG tablet Take 1 tablet (8 mg total) by mouth every 8 (eight) hours as needed for nausea or vomiting. 30 tablet 6   oxyCODONE -acetaminophen  (PERCOCET/ROXICET) 5-325 MG tablet Take 1 tablet by mouth every 8 (eight) hours as needed for severe pain (pain score 7-10). 30 tablet 0   prochlorperazine  (COMPAZINE ) 10 MG tablet TAKE 1 TABLET(10 MG) BY MOUTH EVERY 6 HOURS AS NEEDED FOR NAUSEA OR VOMITING (Patient not taking: Reported on 06/17/2024) 30 tablet 1   spironolactone (ALDACTONE) 25 MG tablet Take 25 mg by mouth daily as needed (swelling).     traMADol  (ULTRAM ) 50 MG tablet Take 1-2 tablets (50-100 mg total) by mouth every 6 (six) hours as needed. 60 tablet 0   tretinoin (RETIN-A) 0.1 % cream Apply 1 application  topically at bedtime as needed (acne).     triamterene -hydrochlorothiazide (DYAZIDE) 37.5-25 MG capsule TAKE 1 CAPSULE BY MOUTH DAILY 90 capsule 1  warfarin (COUMADIN ) 5 MG tablet TAKE 1 TABLET(5 MG) BY MOUTH DAILY 30 tablet PRN   No current facility-administered medications for this visit.    PHYSICAL EXAMINATION: ECOG PERFORMANCE STATUS: 1 - Symptomatic but completely ambulatory  Vitals:   07/08/24 0803  BP: (!) 128/55  Pulse: 64  Resp: 17  Temp: 97.6 F (36.4 C)  SpO2: 100%   Filed Weights   07/08/24 0803  Weight: 113 lb 4.8 oz (51.4 kg)      LABORATORY DATA:  I have reviewed the data as listed    Latest Ref Rng &  Units 06/17/2024    1:35 PM 05/27/2024   12:02 PM 05/06/2024    1:22 PM  CMP  Glucose 70 - 99 mg/dL 84  79  80   BUN 6 - 20 mg/dL 10  13  18    Creatinine 0.44 - 1.00 mg/dL 9.21  9.12  9.15   Sodium 135 - 145 mmol/L 138  138  137   Potassium 3.5 - 5.1 mmol/L 4.0  3.9  3.8   Chloride 98 - 111 mmol/L 104  105  104   CO2 22 - 32 mmol/L 31  30  28    Calcium 8.9 - 10.3 mg/dL 8.9  8.9  8.8   Total Protein 6.5 - 8.1 g/dL 6.3  6.3  6.3   Total Bilirubin 0.0 - 1.2 mg/dL 0.4  0.4  0.4   Alkaline Phos 38 - 126 U/L 95  86  82   AST 15 - 41 U/L 34  31  30   ALT 0 - 44 U/L 32  26  26     Lab Results  Component Value Date   WBC 3.5 (L) 07/08/2024   HGB 11.5 (L) 07/08/2024   HCT 33.8 (L) 07/08/2024   MCV 99.1 07/08/2024   PLT 138 (L) 07/08/2024   NEUTROABS 1.8 07/08/2024    ASSESSMENT & PLAN:  Malignant neoplasm of lower-outer quadrant of right breast of female, estrogen receptor positive (HCC) 08/04/2020: Right breast biopsy T2 N0 M1 stage IV IDC grade 3, triple positive with a Ki-67 of 40%, multiple liver lesions biopsy was positive for breast cancer ER/PR positive HER2 negative with a Ki-67 30% 08/31/2020-12/14/2020: TCHP x6 01/29/2021: Letrozole  and palbociclib    11/29/2022-06/05/2023: Olaparib  07/13/2023-03/24/2023: Xeloda      10/24/2022: CT CAP: Multiple new hepatic lesions measuring up to 1.5 cm consistent with metastatic disease, stable 5 mm nodule left costophrenic sulcus 12/05/2021: Guardant360: ATM gene mutation (possible treatment option olaparib ), MSI high: Not detected.  Will refer the patient to genetic testing   PET CT scan 03/03/2023: Widespread hypermetabolic metastatic disease within the liver is similar to previous PET/CT 06/02/2023: Interval progression of hepatic metastases.  Progression of bone metastases.  Suspicion of peritoneal disease.   06/13/2023: Guardant360: ATM, T p53, FGF R1 amplification, TMB 4.73 mutations 03/30/2024: Right breast biopsy: IDC grade 2, ER 90%, PR 10%, Ki67  40%, HER2 2+ by IHC, FISH positive ratio 2.97 ---------------------------------------------------------------------------------------------------------------------------- Current treatment: Enhertu  cycle 6 Chemo toxicities: Nausea: advised AM Zofran  and Pepcid: I reduced the dosage of Enhertu  for cycle 2 Fatigue Chemo induced anemia: Mild and monitoring closely   Echocardiogram 03/24/2024: EF 55 to 60%   Component Ref Range & Units (hover) 2 wk ago (06/17/24) 1 mo ago (05/27/24) 2 mo ago (05/06/24) 2 mo ago (04/14/24) 3 mo ago (04/01/24) 3 mo ago (03/23/24) 5 mo ago (02/05/24)  CA 27.29 1,268.4 High  1,868.8 High  CM 2,534.8 High  CM  3,700.4 High  CM 3,507.1 High  CM 3,141.7 High  CM    CT CAP 05/23/24: Increase in size of heaptic mets (1.2 cm became 1.9 cm), improvement of peritoneal mets I discussed with the patient that even though the CT scan showed that the liver metastasis slightly increased in size I do not believe it is enough to count as progression.  Her tumor markers are coming down and the peritoneal carcinomatosis has resolved and therefore I recommended that we continue with Enhertu . CT patient need to be rescheduled because there things are not better Return to clinic in 3 weeks for cycle 7 We will cancel the CT scan and obtain a PET CT scan in October. Assessment & Plan Metastatic ER-positive right breast cancer with hepatic metastases Tumor markers decreased from 3700 to 1268, indicating positive treatment response. CT scan showed liver improvement, correlating with tumor marker decrease. - Cancel scheduled CT scan. - Order PET scan for October.  Chemotherapy-induced anemia Blood counts show mild anemia with hemoglobin at 11.5, white blood cells at 3.5, and platelets at 138, within acceptable limits.  Chemotherapy-induced nausea Experiencing some days of nausea, improved compared to previous cycles, with no clear relationship to dietary intake. - Provide refills for  Compazine .      No orders of the defined types were placed in this encounter.  The patient has a good understanding of the overall plan. she agrees with it. she will call with any problems that may develop before the next visit here. Total time spent: 30 mins including face to face time and time spent for planning, charting and co-ordination of care   Erica MARLA Chad, MD 07/08/24

## 2024-07-08 NOTE — Patient Instructions (Signed)
 [facility] - A DEPT OF [facility]  Discharge Instructions: Thank you for choosing [facility] to provide your oncology and hematology care.   If you have a lab appointment with the Cancer Center, please go directly to the Cancer Center and check in at the registration area.   Wear comfortable clothing and clothing appropriate for easy access to any Portacath or PICC line.   We strive to give you quality time with your provider. You may need to reschedule your appointment if you arrive late (15 or more minutes).  Arriving late affects you and other patients whose appointments are after yours.  Also, if you miss three or more appointments without notifying the office, you may be dismissed from the clinic at the provider's discretion.      For prescription refill requests, have your pharmacy contact our office and allow 72 hours for refills to be completed.    Today you received the following chemotherapy and/or immunotherapy agents enhertu      To help prevent nausea and vomiting after your treatment, we encourage you to take your nausea medication as directed.  BELOW ARE SYMPTOMS THAT SHOULD BE REPORTED IMMEDIATELY: *FEVER GREATER THAN 100.4 F (38 C) OR HIGHER *CHILLS OR SWEATING *NAUSEA AND VOMITING THAT IS NOT CONTROLLED WITH YOUR NAUSEA MEDICATION *UNUSUAL SHORTNESS OF BREATH *UNUSUAL BRUISING OR BLEEDING *URINARY PROBLEMS (pain or burning when urinating, or frequent urination) *BOWEL PROBLEMS (unusual diarrhea, constipation, pain near the anus) TENDERNESS IN MOUTH AND THROAT WITH OR WITHOUT PRESENCE OF ULCERS (sore throat, sores in mouth, or a toothache) UNUSUAL RASH, SWELLING OR PAIN  UNUSUAL VAGINAL DISCHARGE OR ITCHING   Items with * indicate a potential emergency and should be followed up as soon as possible or go to the Emergency Department if any problems should occur.  Please show the CHEMOTHERAPY ALERT CARD or IMMUNOTHERAPY  ALERT CARD at check-in to the Emergency Department and triage nurse.  Should you have questions after your visit or need to cancel or reschedule your appointment, please contact [facility] - A DEPT [facility]  Dept: [phone]  and follow the prompts.  Office hours are 8:00 a.m. to 4:30 p.m. Monday - Friday. Please note that voicemails left after 4:00 p.m. may not be returned until the following business day.  We are closed weekends and major holidays. You have access to a nurse at all times for urgent questions. Please call the main number to the clinic Dept: [phone] and follow the prompts.   For any non-urgent questions, you may also contact your provider using MyChart. We now offer e-Visits for anyone 60 and older to request care online for non-urgent symptoms. For details visit [email or web address].   Also download the MyChart app! Go to the app store, search "MyChart", open the app, select [place], and log in with your MyChart username and password.

## 2024-07-09 LAB — CANCER ANTIGEN 27.29: CA 27.29: 1020.7 U/mL — ABNORMAL HIGH (ref 0.0–38.6)

## 2024-07-09 MED ORDER — AMPHETAMINE-DEXTROAMPHET ER 20 MG PO CP24: 20.0000 mg | ORAL_CAPSULE | Freq: Every day | ORAL | 0 refills | Status: DC

## 2024-07-09 NOTE — Telephone Encounter (Signed)
 Patient called the office stating [facility] does not have the Adderall. Patient is asking for the prescription to be sent to [facility]. Please res-send.

## 2024-07-09 NOTE — Addendum Note (Signed)
 Addended by: CENA ALFONSO CROME on: 07/09/2024 10:59 AM   Modules accepted: Orders

## 2024-07-13 ENCOUNTER — Other Ambulatory Visit: Payer: Self-pay | Admitting: Physician Assistant

## 2024-07-13 NOTE — Telephone Encounter (Signed)
 Last Fill: 04/08/2024  Eye exam: 05/05/2024 WNL    Labs: 07/08/2024 [place] 8.8 Total Protein 6.0 Anion gap 3 WBC 3.5 RBC 3.41 Hemoglobin 11.5 HCT 33.8 Platelet Count 138   Next Visit: 10/21/2024  Last Visit: 04/08/2024  DX:SLE   Current Dose per office note 04/08/2024: Plaquenil  200 mg 1 tablet by mouth daily.   Okay to refill Plaquenil ?

## 2024-07-17 ENCOUNTER — Other Ambulatory Visit: Payer: Self-pay | Admitting: Physician Assistant

## 2024-07-21 ENCOUNTER — Ambulatory Visit (HOSPITAL_COMMUNITY)

## 2024-07-28 MED FILL — Fosaprepitant Dimeglumine For IV Infusion 150 MG (Base Eq): INTRAVENOUS | Qty: 5 | Status: AC

## 2024-07-29 ENCOUNTER — Inpatient Hospital Stay (HOSPITAL_BASED_OUTPATIENT_CLINIC_OR_DEPARTMENT_OTHER): Admitting: Hematology and Oncology

## 2024-07-29 ENCOUNTER — Ambulatory Visit

## 2024-07-29 ENCOUNTER — Ambulatory Visit: Admitting: Adult Health

## 2024-07-29 ENCOUNTER — Inpatient Hospital Stay

## 2024-07-29 ENCOUNTER — Inpatient Hospital Stay: Attending: Hematology and Oncology

## 2024-07-29 ENCOUNTER — Other Ambulatory Visit

## 2024-07-29 VITALS — BP 110/70 | HR 66 | Temp 98.0°F | Resp 18 | Ht 64.0 in | Wt 113.0 lb

## 2024-07-29 VITALS — BP 105/66 | HR 59 | Temp 98.1°F | Resp 16

## 2024-07-29 DIAGNOSIS — C50511 Malignant neoplasm of lower-outer quadrant of right female breast: Secondary | ICD-10-CM | POA: Diagnosis present

## 2024-07-29 DIAGNOSIS — Z1721 Progesterone receptor positive status: Secondary | ICD-10-CM | POA: Diagnosis not present

## 2024-07-29 DIAGNOSIS — Z5112 Encounter for antineoplastic immunotherapy: Secondary | ICD-10-CM | POA: Diagnosis present

## 2024-07-29 DIAGNOSIS — Z1732 Human epidermal growth factor receptor 2 negative status: Secondary | ICD-10-CM | POA: Diagnosis not present

## 2024-07-29 DIAGNOSIS — Z79899 Other long term (current) drug therapy: Secondary | ICD-10-CM | POA: Insufficient documentation

## 2024-07-29 DIAGNOSIS — C787 Secondary malignant neoplasm of liver and intrahepatic bile duct: Secondary | ICD-10-CM | POA: Insufficient documentation

## 2024-07-29 DIAGNOSIS — C7951 Secondary malignant neoplasm of bone: Secondary | ICD-10-CM | POA: Diagnosis not present

## 2024-07-29 DIAGNOSIS — Z170 Estrogen receptor positive status [ER+]: Secondary | ICD-10-CM

## 2024-07-29 LAB — CBC WITH DIFFERENTIAL (CANCER CENTER ONLY)
Abs Immature Granulocytes: 0 K/uL (ref 0.00–0.07)
Basophils Absolute: 0.1 K/uL (ref 0.0–0.1)
Basophils Relative: 2 %
Eosinophils Absolute: 0 K/uL (ref 0.0–0.5)
Eosinophils Relative: 1 %
HCT: 36.4 % (ref 36.0–46.0)
Hemoglobin: 12.2 g/dL (ref 12.0–15.0)
Immature Granulocytes: 0 %
Lymphocytes Relative: 33 %
Lymphs Abs: 1.3 K/uL (ref 0.7–4.0)
MCH: 32.9 pg (ref 26.0–34.0)
MCHC: 33.5 g/dL (ref 30.0–36.0)
MCV: 98.1 fL (ref 80.0–100.0)
Monocytes Absolute: 0.5 K/uL (ref 0.1–1.0)
Monocytes Relative: 12 %
Neutro Abs: 2 K/uL (ref 1.7–7.7)
Neutrophils Relative %: 52 %
Platelet Count: 166 K/uL (ref 150–400)
RBC: 3.71 MIL/uL — ABNORMAL LOW (ref 3.87–5.11)
RDW: 13.1 % (ref 11.5–15.5)
WBC Count: 3.8 K/uL — ABNORMAL LOW (ref 4.0–10.5)
nRBC: 0 % (ref 0.0–0.2)

## 2024-07-29 LAB — CMP (CANCER CENTER ONLY)
ALT: 22 U/L (ref 0–44)
AST: 31 U/L (ref 15–41)
Albumin: 3.9 g/dL (ref 3.5–5.0)
Alkaline Phosphatase: 99 U/L (ref 38–126)
Anion gap: 3 — ABNORMAL LOW (ref 5–15)
BUN: 11 mg/dL (ref 6–20)
CO2: 30 mmol/L (ref 22–32)
Calcium: 8.8 mg/dL — ABNORMAL LOW (ref 8.9–10.3)
Chloride: 104 mmol/L (ref 98–111)
Creatinine: 0.78 mg/dL (ref 0.44–1.00)
GFR, Estimated: >60 mL/min (ref >60)
Glucose, Bld: 87 mg/dL (ref 70–99)
Potassium: 3.9 mmol/L (ref 3.5–5.1)
Sodium: 137 mmol/L (ref 135–145)
Total Bilirubin: 0.4 mg/dL (ref 0.0–1.2)
Total Protein: 6.5 g/dL (ref 6.5–8.1)

## 2024-07-29 MED ORDER — ACETAMINOPHEN 325 MG PO TABS
650.0000 mg | ORAL_TABLET | Freq: Once | ORAL | Status: AC
  Administered 2024-07-29: 650 mg via ORAL
  Filled 2024-07-29: qty 2

## 2024-07-29 MED ORDER — DIPHENHYDRAMINE HCL 25 MG PO CAPS
25.0000 mg | ORAL_CAPSULE | Freq: Once | ORAL | Status: AC
  Administered 2024-07-29: 25 mg via ORAL
  Filled 2024-07-29: qty 1

## 2024-07-29 MED ORDER — SODIUM CHLORIDE 0.9 % IV SOLN
150.0000 mg | Freq: Once | INTRAVENOUS | Status: AC
  Administered 2024-07-29: 150 mg via INTRAVENOUS
  Filled 2024-07-29: qty 150

## 2024-07-29 MED ORDER — SODIUM CHLORIDE 0.9% FLUSH: 10.0000 mL | INTRAVENOUS | Status: DC | PRN

## 2024-07-29 MED ORDER — PALONOSETRON HCL INJECTION 0.25 MG/5ML
0.2500 mg | Freq: Once | INTRAVENOUS | Status: AC
  Administered 2024-07-29: 0.25 mg via INTRAVENOUS
  Filled 2024-07-29: qty 5

## 2024-07-29 MED ORDER — FAM-TRASTUZUMAB DERUXTECAN-NXKI CHEMO 100 MG IV SOLR
4.4000 mg/kg | Freq: Once | INTRAVENOUS | Status: AC
  Administered 2024-07-29: 240 mg via INTRAVENOUS
  Filled 2024-07-29: qty 12

## 2024-07-29 MED ORDER — DEXTROSE 5 % IV SOLN: INTRAVENOUS | Status: DC

## 2024-07-29 NOTE — Assessment & Plan Note (Signed)
 08/04/2020: Right breast biopsy T2 N0 M1 stage IV IDC grade 3, triple positive with a Ki-67 of 40%, multiple liver lesions biopsy was positive for breast cancer ER/PR positive HER2 negative with a Ki-67 30% 08/31/2020-12/14/2020: TCHP x6 01/29/2021: Letrozole  and palbociclib    11/29/2022-06/05/2023: Olaparib  07/13/2023-03/24/2023: Xeloda      10/24/2022: CT CAP: Multiple new hepatic lesions measuring up to 1.5 cm consistent with metastatic disease, stable 5 mm nodule left costophrenic sulcus 12/05/2021: Guardant360: ATM gene mutation (possible treatment option olaparib ), MSI high: Not detected.  Will refer the patient to genetic testing   PET CT scan 03/03/2023: Widespread hypermetabolic metastatic disease within the liver is similar to previous PET/CT 06/02/2023: Interval progression of hepatic metastases.  Progression of bone metastases.  Suspicion of peritoneal disease.   06/13/2023: Guardant360: ATM, T p53, FGF R1 amplification, TMB 4.73 mutations 03/30/2024: Right breast biopsy: IDC grade 2, ER 90%, PR 10%, Ki67 40%, HER2 2+ by IHC, FISH positive ratio 2.97 ---------------------------------------------------------------------------------------------------------------------------- Current treatment: Enhertu  cycle 7 Chemo toxicities: Nausea: advised AM Zofran  and Pepcid: I reduced the dosage of Enhertu  for cycle 2 Fatigue Chemo induced anemia: Mild and monitoring closely   Echocardiogram 03/24/2024: EF 55 to 60% PET/CT scan planned for October 2025

## 2024-07-29 NOTE — Patient Instructions (Signed)
 [facility] - A DEPT OF [facility]  Discharge Instructions: Thank you for choosing [facility] to provide your oncology and hematology care.   If you have a lab appointment with the Cancer Center, please go directly to the Cancer Center and check in at the registration area.   Wear comfortable clothing and clothing appropriate for easy access to any Portacath or PICC line.   We strive to give you quality time with your provider. You may need to reschedule your appointment if you arrive late (15 or more minutes).  Arriving late affects you and other patients whose appointments are after yours.  Also, if you miss three or more appointments without notifying the office, you may be dismissed from the clinic at the provider's discretion.      For prescription refill requests, have your pharmacy contact our office and allow 72 hours for refills to be completed.    Today you received the following chemotherapy and/or immunotherapy agent: Fam Trastuzumab  (Enhertu )   To help prevent nausea and vomiting after your treatment, we encourage you to take your nausea medication as directed.  BELOW ARE SYMPTOMS THAT SHOULD BE REPORTED IMMEDIATELY: *FEVER GREATER THAN 100.4 F (38 C) OR HIGHER *CHILLS OR SWEATING *NAUSEA AND VOMITING THAT IS NOT CONTROLLED WITH YOUR NAUSEA MEDICATION *UNUSUAL SHORTNESS OF BREATH *UNUSUAL BRUISING OR BLEEDING *URINARY PROBLEMS (pain or burning when urinating, or frequent urination) *BOWEL PROBLEMS (unusual diarrhea, constipation, pain near the anus) TENDERNESS IN MOUTH AND THROAT WITH OR WITHOUT PRESENCE OF ULCERS (sore throat, sores in mouth, or a toothache) UNUSUAL RASH, SWELLING OR PAIN  UNUSUAL VAGINAL DISCHARGE OR ITCHING   Items with * indicate a potential emergency and should be followed up as soon as possible or go to the Emergency Department if any problems should occur.  Please show the CHEMOTHERAPY ALERT CARD or  IMMUNOTHERAPY ALERT CARD at check-in to the Emergency Department and triage nurse.  Should you have questions after your visit or need to cancel or reschedule your appointment, please contact [facility] - A DEPT OF [facility]  Dept: [phone]  and follow the prompts.  Office hours are 8:00 a.m. to 4:30 p.m. Monday - Friday. Please note that voicemails left after 4:00 p.m. may not be returned until the following business day.  We are closed weekends and major holidays. You have access to a nurse at all times for urgent questions. Please call the main number to the clinic Dept: [phone] and follow the prompts.   For any non-urgent questions, you may also contact your provider using MyChart. We now offer e-Visits for anyone 54 and older to request care online for non-urgent symptoms. For details visit [email or web address].   Also download the MyChart app! Go to the app store, search MyChart, open the app, select [facility], and log in with your MyChart username and password.

## 2024-07-29 NOTE — Progress Notes (Signed)
 Patient Care Team: Hughie Sharper, MD as PCP - General (Family Medicine) Rene, Rocky RAMAN, MD as Referring Physician (Oncology) Tyree Nanetta SAILOR, RN as Oncology Nurse Navigator Ebbie Cough, MD as Consulting Physician (General Surgery) Dewey Rush, MD as Consulting Physician (Radiation Oncology) Dolphus Reiter, MD as Consulting Physician (Rheumatology) Lenon Oneil BRAVO, MD as Consulting Physician (Obstetrics and Gynecology) Rolan Ezra RAMAN, MD as Consulting Physician (Cardiology) Armbruster, Elspeth SQUIBB, MD as Consulting Physician (Gastroenterology) Jeannetta Carlin Lenis, MD as Referring Physician (Ophthalmology) Jeannetta Carlin Lenis, MD as Referring Physician (Ophthalmology) Odean Potts, MD as Consulting Physician (Hematology and Oncology) Lucila Rush LABOR, RPH-CPP as Pharmacist (Hematology and Oncology)  DIAGNOSIS:  Encounter Diagnosis  Name Primary?   Malignant neoplasm of lower-outer quadrant of right breast of female, estrogen receptor positive (HCC) Yes    SUMMARY OF ONCOLOGIC HISTORY: Oncology History  Malignant neoplasm of lower-outer quadrant of right breast of female, estrogen receptor positive (HCC)  08/04/2020 Initial Diagnosis   Right breast lower outer quadrant biopsy 08/04/2020 for a clinical T2 N0 M1, stage IV invasive ductal carcinoma, grade 3, triple positive, with an MIB-1 of 40%. abdominal MRI 08/24/2020 shows multiple liver lesions highly suspicious for liver metastases. Liver biopsy 09/20/2020 positive for carcinoma, prognostic panel estrogen and progesterone receptor positive, but HER-2 negative (0); MIB-1 of 30%   08/31/2020 - 12/14/2020 Chemotherapy   Neoadjuvant chemotherapy consisting of docetaxel , carboplatin , trastuzumab  and pertuzumab  every 21 days x 6    12/26/2020 Imaging   breast MRI 12/26/2020 showed right breast index lesion decreased to 0.8 cm; 2 additional masses in the right breast stable abdominal MRI 01/29/2021 shows no active disease in  the liver    Chemotherapy   Herceptin  Maintenance initially q 3 weeks, now q 4 weeks   01/29/2021 -  Anti-estrogen oral therapy   anastrozole  and palbociclib     01/29/2023 Genetic Testing   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  Report date is 01/29/2023.   The Multi-Cancer + RNA Panel offered by [facility] includes sequencing and/or deletion/duplication analysis of the following 70 genes:  AIP*, ALK, APC*, ATM*, AXIN2*, BAP1*, BARD1*, BLM*, BMPR1A*, BRCA1*, BRCA2*, BRIP1*, CDC73*, CDH1*, CDK4, CDKN1B*, CDKN2A, CHEK2*, CTNNA1*, DICER1*, EPCAM (del/dup only), EGFR, FH*, FLCN*, GREM1 (promoter dup only), HOXB13, KIT, LZTR1, MAX*, MBD4, MEN1*, MET, MITF, MLH1*, MSH2*, MSH3*, MSH6*, MUTYH*, NF1*, NF2*, NTHL1*, PALB2*, PDGFRA, PMS2*, POLD1*, POLE*, POT1*, PRKAR1A*, PTCH1*, PTEN*, RAD51C*, RAD51D*, RB1*, RET, SDHA* (sequencing only), SDHAF2*, SDHB*, SDHC*, SDHD*, SMAD4*, SMARCA4*, SMARCB1*, SMARCE1*, STK11*, SUFU*, TMEM127*, TP53*, TSC1*, TSC2*, VHL*. RNA analysis is performed for * genes.   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  Report date is 01/29/2023.   The Multi-Cancer + RNA Panel offered by [facility] includes sequencing and/or deletion/duplication analysis of the following 70 genes:  AIP*, ALK, APC*, ATM*, AXIN2*, BAP1*, BARD1*, BLM*, BMPR1A*, BRCA1*, BRCA2*, BRIP1*, CDC73*, CDH1*, CDK4, CDKN1B*, CDKN2A, CHEK2*, CTNNA1*, DICER1*, EPCAM (del/dup only), EGFR, FH*, FLCN*, GREM1 (promoter dup only), HOXB13, KIT, LZTR1, MAX*, MBD4, MEN1*, MET, MITF, MLH1*, MSH2*, MSH3*, MSH6*, MUTYH*, NF1*, NF2*, NTHL1*,  PALB2*, PDGFRA, PMS2*, POLD1*, POLE*, POT1*, PRKAR1A*, PTCH1*, PTEN*, RAD51C*, RAD51D*, RB1*, RET,  SDHA* (sequencing only), SDHAF2*, SDHB*, SDHC*, SDHD*, SMAD4*, SMARCA4*, SMARCB1*, SMARCE1*, STK11*, SUFU*, TMEM127*, TP53*, TSC1*, TSC2*, VHL*. RNA analysis is performed for * genes.     CHIEF COMPLIANT: Follow-up on Enhertu   HISTORY OF PRESENT ILLNESS:   History of Present Illness Erica Wood is a 58 year old female with HER2-positive breast cancer who presents for follow-up of her treatment.  She experiences persistent fatigue and nausea, which are consistent with her previous treatments. Her energy levels improve about a week before her next treatment cycle. Recently, she has noticed an increase in the number of days with nausea, although there are no specific differences between treatment cycles. Her current medication for nausea is effective.     ALLERGIES:  is allergic to beef-derived drug products, latex, sulfa antibiotics, and tape.  MEDICATIONS:  Current Outpatient Medications  Medication Sig Dispense Refill   acetaminophen  (TYLENOL ) 500 MG tablet Take 1,000 mg by mouth as needed for moderate pain (pain score 4-6) or headache.     ALPRAZolam  (XANAX ) 0.25 MG tablet Take 1-2 tabs (0.25mg -0.50mg ) 30-60 minutes before procedure. May repeat if needed.Do not drive. 4 tablet 0   amphetamine -dextroamphetamine (ADDERALL XR) 20 MG 24 hr capsule Take 1 capsule (20 mg total) by mouth daily. 30 capsule 0   Ascorbic Acid (VITAMIN C PO) Take 1,000 mg by mouth daily.     Cholecalciferol (VITAMIN D3) 75 MCG (3000 UT) TABS Take 3,000 Units by mouth daily.     diclofenac  Sodium (VOLTAREN ) 1 % GEL Research Patient: Apply 0.5 grams (1 fingertip) to each hand and each foot twice daily for up to 12 weeks 400 g 0   estradiol  (ESTRACE ) 0.1 MG/GM vaginal cream INSERT 1 APPLICATORFUL VAGINALLY 3 TIMES A WEEK 42.5 g 12   famotidine (PEPCID) 20 MG tablet Take 20 mg by mouth at bedtime.     fluconazole   (DIFLUCAN ) 100 MG tablet Take 1 tablet (100 mg total) by mouth daily. 5 tablet 0   fluorometholone (FML) 0.1 % ophthalmic suspension 1 drop 2 (two) times daily.     hydroxychloroquine  (PLAQUENIL ) 200 MG tablet TAKE 1 TABLET(200 MG) BY MOUTH DAILY 90 tablet 0   lidocaine -prilocaine  (EMLA ) cream Apply to affected area once 30 g 3   Magnesium  Citrate 100 MG TABS Take 1 tablet by mouth daily as needed. 30 tablet 3   minoxidil (LONITEN) 2.5 MG tablet Take 1.25 mg by mouth daily.     ondansetron  (ZOFRAN ) 8 MG tablet Take 1 tablet (8 mg total) by mouth every 8 (eight) hours as needed for nausea or vomiting. 30 tablet 6   oxyCODONE -acetaminophen  (PERCOCET/ROXICET) 5-325 MG tablet Take 1 tablet by mouth every 8 (eight) hours as needed for severe pain (pain score 7-10). 30 tablet 0   prochlorperazine  (COMPAZINE ) 10 MG tablet Take 1 tablet (10 mg total) by mouth every 8 (eight) hours as needed for nausea or vomiting. 30 tablet 6   spironolactone (ALDACTONE) 25 MG tablet Take 25 mg by mouth daily as needed (swelling).     traMADol  (ULTRAM ) 50 MG tablet Take 1-2 tablets (50-100 mg total) by mouth every 6 (six) hours as needed. 60 tablet 0   tretinoin (RETIN-A) 0.1 % cream Apply 1 application  topically at bedtime as needed (acne).     triamterene -hydrochlorothiazide (DYAZIDE) 37.5-25 MG capsule TAKE 1 CAPSULE BY MOUTH DAILY 90 capsule 1   warfarin (COUMADIN ) 5 MG tablet TAKE 1 TABLET(5 MG) BY MOUTH DAILY 30 tablet PRN   No current facility-administered medications for  this visit.   Facility-Administered Medications Ordered in Other Visits  Medication Dose Route Frequency Provider Last Rate Last Admin   dextrose  5 % solution   Intravenous Continuous Odean Potts, MD 20 mL/hr at 07/29/24 1528 New Bag at 07/29/24 1528   fam-trastuzumab deruxtecan-nxki  (ENHERTU ) 240 mg in dextrose  5 % 100 mL chemo infusion  4.4 mg/kg (Treatment Plan Recorded) Intravenous Once Odean Potts, MD       fosaprepitant  (EMEND) 150 mg  in sodium chloride  0.9 % 145 mL IVPB  150 mg Intravenous Once Odean Potts, MD 450 mL/hr at 07/29/24 1538 150 mg at 07/29/24 1538   sodium chloride  flush (NS) 0.9 % injection 10 mL  10 mL Intracatheter PRN Odean Potts, MD        PHYSICAL EXAMINATION: ECOG PERFORMANCE STATUS: 1 - Symptomatic but completely ambulatory  Vitals:   07/29/24 1446  BP: 110/70  Pulse: 66  Resp: 18  Temp: 98 F (36.7 C)  SpO2: 100%   Filed Weights   07/29/24 1446  Weight: 113 lb (51.3 kg)      LABORATORY DATA:  I have reviewed the data as listed    Latest Ref Rng & Units 07/29/2024    2:14 PM 07/08/2024    7:43 AM 06/17/2024    1:35 PM  CMP  Glucose 70 - 99 mg/dL 87  80  84   BUN 6 - 20 mg/dL 11  11  10    Creatinine 0.44 - 1.00 mg/dL 9.21  9.13  9.21   Sodium 135 - 145 mmol/L 137  137  138   Potassium 3.5 - 5.1 mmol/L 3.9  3.9  4.0   Chloride 98 - 111 mmol/L 104  104  104   CO2 22 - 32 mmol/L 30  30  31    Calcium 8.9 - 10.3 mg/dL 8.8  8.8  8.9   Total Protein 6.5 - 8.1 g/dL 6.5  6.0  6.3   Total Bilirubin 0.0 - 1.2 mg/dL 0.4  0.3  0.4   Alkaline Phos 38 - 126 U/L 99  90  95   AST 15 - 41 U/L 31  27  34   ALT 0 - 44 U/L 22  21  32     Lab Results  Component Value Date   WBC 3.8 (L) 07/29/2024   HGB 12.2 07/29/2024   HCT 36.4 07/29/2024   MCV 98.1 07/29/2024   PLT 166 07/29/2024   NEUTROABS 2.0 07/29/2024    ASSESSMENT & PLAN:  Malignant neoplasm of lower-outer quadrant of right breast of female, estrogen receptor positive (HCC) 08/04/2020: Right breast biopsy T2 N0 M1 stage IV IDC grade 3, triple positive with a Ki-67 of 40%, multiple liver lesions biopsy was positive for breast cancer ER/PR positive HER2 negative with a Ki-67 30% 08/31/2020-12/14/2020: TCHP x6 01/29/2021: Letrozole  and palbociclib    11/29/2022-06/05/2023: Olaparib  07/13/2023-03/24/2023: Xeloda      10/24/2022: CT CAP: Multiple new hepatic lesions measuring up to 1.5 cm consistent with metastatic disease, stable 5 mm nodule  left costophrenic sulcus 12/05/2021: [facility]: ATM gene mutation (possible treatment option olaparib ), MSI high: Not detected.  Will refer the patient to genetic testing   PET CT scan 03/03/2023: Widespread hypermetabolic metastatic disease within the liver is similar to previous PET/CT 06/02/2023: Interval progression of hepatic metastases.  Progression of bone metastases.  Suspicion of peritoneal disease.   06/13/2023: [facility]: ATM, T p53, FGF R1 amplification, TMB 4.73 mutations 03/30/2024: Right breast biopsy: IDC grade 2, ER 90%, PR 10%,  Ki67 40%, HER2 2+ by IHC, FISH positive ratio 2.97 ---------------------------------------------------------------------------------------------------------------------------- Current treatment: Enhertu  cycle 7 Chemo toxicities: Nausea: advised AM Zofran  and Pepcid: I reduced the dosage of Enhertu  for cycle 2 Fatigue Chemo induced anemia: Mild and monitoring closely   Echocardiogram 03/24/2024: EF 55 to 60% PET/CT scan planned for October 2025 ------------------------------------- Assessment and Plan Assessment & Plan Metastatic HER2-positive breast cancer with hepatic metastases Undergoing treatment with persistent fatigue and nausea due to chemotherapy. Blood counts adequate. Liver function tests previously normal. - Continue current chemotherapy regimen. - Schedule follow-up scans on October 16th to assess treatment response.  Chemotherapy-induced nausea Ongoing nausea managed with anti-nausea medication. - Continue current anti-nausea medication regimen.      No orders of the defined types were placed in this encounter.  The patient has a good understanding of the overall plan. she agrees with it. she will call with any problems that may develop before the next visit here. Total time spent: 30 mins including face to face time and time spent for planning, charting and co-ordination of care   Erica MARLA Chad, MD 07/29/24

## 2024-08-05 ENCOUNTER — Telehealth: Payer: Self-pay

## 2024-08-05 MED ORDER — FLUCONAZOLE 100 MG PO TABS
100.0000 mg | ORAL_TABLET | Freq: Two times a day (BID) | ORAL | 0 refills | Status: AC
Start: 2024-08-05 — End: 2024-08-19

## 2024-08-05 NOTE — Telephone Encounter (Signed)
 Received call from pt c/o white patches in her throat and bad taste in my mouth, stating she has had this before and it was a yeast infection. She reports sudden onset last night. Endorses some discomfort to her throat but denies fever, post nasal drip. Per MD rx sent for diflucan  100 mg BID x 7 days. She prefers for it to be sent to [facility] on [address]. She was advised to go to urgent care over the weekend if she has not found relief by Saturday, as this could also be strep throat and she would need to be swabbed. She verbalized agreement.

## 2024-08-09 ENCOUNTER — Other Ambulatory Visit: Payer: Self-pay | Admitting: *Deleted

## 2024-08-09 MED ORDER — AMPHETAMINE-DEXTROAMPHET ER 20 MG PO CP24: 20.0000 mg | ORAL_CAPSULE | Freq: Every day | ORAL | 0 refills | Status: DC

## 2024-08-09 NOTE — Telephone Encounter (Signed)
 Patient contacted the office and left message requesting a refill on Adderall to the [facility] in [name].   Last Fill: 07/09/2024   Next Visit: 10/21/2024   Last Visit: 04/08/2024   Dx: SLE (systemic lupus erythematosus related syndrome)    Current Dose per office note on 04/08/2024: not discussed   Okay to refill Adderall?

## 2024-08-18 MED FILL — Fosaprepitant Dimeglumine For IV Infusion 150 MG (Base Eq): INTRAVENOUS | Qty: 5 | Status: AC

## 2024-08-19 ENCOUNTER — Inpatient Hospital Stay: Attending: Hematology and Oncology

## 2024-08-19 ENCOUNTER — Inpatient Hospital Stay

## 2024-08-19 ENCOUNTER — Inpatient Hospital Stay (HOSPITAL_BASED_OUTPATIENT_CLINIC_OR_DEPARTMENT_OTHER): Admitting: Hematology and Oncology

## 2024-08-19 VITALS — BP 113/54 | HR 66 | Temp 97.7°F | Resp 18 | Ht 64.0 in | Wt 113.0 lb

## 2024-08-19 DIAGNOSIS — C787 Secondary malignant neoplasm of liver and intrahepatic bile duct: Secondary | ICD-10-CM

## 2024-08-19 DIAGNOSIS — Z170 Estrogen receptor positive status [ER+]: Secondary | ICD-10-CM

## 2024-08-19 DIAGNOSIS — C50511 Malignant neoplasm of lower-outer quadrant of right female breast: Secondary | ICD-10-CM | POA: Insufficient documentation

## 2024-08-19 DIAGNOSIS — Z79899 Other long term (current) drug therapy: Secondary | ICD-10-CM | POA: Insufficient documentation

## 2024-08-19 DIAGNOSIS — Z1732 Human epidermal growth factor receptor 2 negative status: Secondary | ICD-10-CM | POA: Insufficient documentation

## 2024-08-19 DIAGNOSIS — C7951 Secondary malignant neoplasm of bone: Secondary | ICD-10-CM | POA: Insufficient documentation

## 2024-08-19 DIAGNOSIS — Z5112 Encounter for antineoplastic immunotherapy: Secondary | ICD-10-CM | POA: Insufficient documentation

## 2024-08-19 DIAGNOSIS — Z1721 Progesterone receptor positive status: Secondary | ICD-10-CM | POA: Insufficient documentation

## 2024-08-19 LAB — CBC WITH DIFFERENTIAL (CANCER CENTER ONLY)
Abs Immature Granulocytes: 0.01 K/uL (ref 0.00–0.07)
Basophils Absolute: 0.1 K/uL (ref 0.0–0.1)
Basophils Relative: 1 %
Eosinophils Absolute: 0 K/uL (ref 0.0–0.5)
Eosinophils Relative: 0 %
HCT: 37 % (ref 36.0–46.0)
Hemoglobin: 12.6 g/dL (ref 12.0–15.0)
Immature Granulocytes: 0 %
Lymphocytes Relative: 29 %
Lymphs Abs: 1.3 K/uL (ref 0.7–4.0)
MCH: 33 pg (ref 26.0–34.0)
MCHC: 34.1 g/dL (ref 30.0–36.0)
MCV: 96.9 fL (ref 80.0–100.0)
Monocytes Absolute: 0.3 K/uL (ref 0.1–1.0)
Monocytes Relative: 7 %
Neutro Abs: 2.8 K/uL (ref 1.7–7.7)
Neutrophils Relative %: 63 %
Platelet Count: 133 K/uL — ABNORMAL LOW (ref 150–400)
RBC: 3.82 MIL/uL — ABNORMAL LOW (ref 3.87–5.11)
RDW: 13.3 % (ref 11.5–15.5)
WBC Count: 4.6 K/uL (ref 4.0–10.5)
nRBC: 0 % (ref 0.0–0.2)

## 2024-08-19 LAB — CMP (CANCER CENTER ONLY)
ALT: 21 U/L (ref 0–44)
AST: 30 U/L (ref 15–41)
Albumin: 4 g/dL (ref 3.5–5.0)
Alkaline Phosphatase: 103 U/L (ref 38–126)
Anion gap: 2 — ABNORMAL LOW (ref 5–15)
BUN: 12 mg/dL (ref 6–20)
CO2: 31 mmol/L (ref 22–32)
Calcium: 9.1 mg/dL (ref 8.9–10.3)
Chloride: 103 mmol/L (ref 98–111)
Creatinine: 0.79 mg/dL (ref 0.44–1.00)
GFR, Estimated: >60 mL/min (ref >60)
Glucose, Bld: 153 mg/dL — ABNORMAL HIGH (ref 70–99)
Potassium: 3.7 mmol/L (ref 3.5–5.1)
Sodium: 136 mmol/L (ref 135–145)
Total Bilirubin: 0.5 mg/dL (ref 0.0–1.2)
Total Protein: 6.6 g/dL (ref 6.5–8.1)

## 2024-08-19 MED ORDER — SODIUM CHLORIDE 0.9 % IV SOLN
150.0000 mg | Freq: Once | INTRAVENOUS | Status: AC
  Administered 2024-08-19: 150 mg via INTRAVENOUS
  Filled 2024-08-19: qty 150

## 2024-08-19 MED ORDER — FAM-TRASTUZUMAB DERUXTECAN-NXKI CHEMO 100 MG IV SOLR
4.4000 mg/kg | Freq: Once | INTRAVENOUS | Status: AC
  Administered 2024-08-19: 240 mg via INTRAVENOUS
  Filled 2024-08-19: qty 12

## 2024-08-19 MED ORDER — PALONOSETRON HCL INJECTION 0.25 MG/5ML
0.2500 mg | Freq: Once | INTRAVENOUS | Status: AC
  Administered 2024-08-19: 0.25 mg via INTRAVENOUS
  Filled 2024-08-19: qty 5

## 2024-08-19 MED ORDER — ACETAMINOPHEN 325 MG PO TABS
650.0000 mg | ORAL_TABLET | Freq: Once | ORAL | Status: AC
  Administered 2024-08-19: 650 mg via ORAL
  Filled 2024-08-19: qty 2

## 2024-08-19 MED ORDER — DIPHENHYDRAMINE HCL 25 MG PO CAPS
25.0000 mg | ORAL_CAPSULE | Freq: Once | ORAL | Status: AC
  Administered 2024-08-19: 25 mg via ORAL
  Filled 2024-08-19: qty 1

## 2024-08-19 MED ORDER — DEXTROSE 5 % IV SOLN: INTRAVENOUS | Status: DC

## 2024-08-19 MED ORDER — SODIUM CHLORIDE 0.9% FLUSH: 10.0000 mL | INTRAVENOUS | Status: DC | PRN

## 2024-08-19 NOTE — Progress Notes (Signed)
 Patient Care Team: Hughie Sharper, MD as PCP - General (Family Medicine) Rene, Rocky RAMAN, MD as Referring Physician (Oncology) Tyree Nanetta SAILOR, RN as Oncology Nurse Navigator Ebbie Cough, MD as Consulting Physician (General Surgery) Dewey Rush, MD as Consulting Physician (Radiation Oncology) Dolphus Reiter, MD as Consulting Physician (Rheumatology) Lenon Oneil BRAVO, MD as Consulting Physician (Obstetrics and Gynecology) Rolan Ezra RAMAN, MD as Consulting Physician (Cardiology) Armbruster, Elspeth SQUIBB, MD as Consulting Physician (Gastroenterology) Jeannetta Carlin Lenis, MD as Referring Physician (Ophthalmology) Jeannetta Carlin Lenis, MD as Referring Physician (Ophthalmology) Odean Potts, MD as Consulting Physician (Hematology and Oncology) Lucila Rush LABOR, RPH-CPP as Pharmacist (Hematology and Oncology)  DIAGNOSIS:  Encounter Diagnosis  Name Primary?   Malignant neoplasm of lower-outer quadrant of right breast of female, estrogen receptor positive (HCC) Yes    SUMMARY OF ONCOLOGIC HISTORY: Oncology History  Malignant neoplasm of lower-outer quadrant of right breast of female, estrogen receptor positive (HCC)  08/04/2020 Initial Diagnosis   Right breast lower outer quadrant biopsy 08/04/2020 for a clinical T2 N0 M1, stage IV invasive ductal carcinoma, grade 3, triple positive, with an MIB-1 of 40%. abdominal MRI 08/24/2020 shows multiple liver lesions highly suspicious for liver metastases. Liver biopsy 09/20/2020 positive for carcinoma, prognostic panel estrogen and progesterone receptor positive, but HER-2 negative (0); MIB-1 of 30%   08/31/2020 - 12/14/2020 Chemotherapy   Neoadjuvant chemotherapy consisting of docetaxel , carboplatin , trastuzumab  and pertuzumab  every 21 days x 6    12/26/2020 Imaging   breast MRI 12/26/2020 showed right breast index lesion decreased to 0.8 cm; 2 additional masses in the right breast stable abdominal MRI 01/29/2021 shows no active disease in  the liver    Chemotherapy   Herceptin  Maintenance initially q 3 weeks, now q 4 weeks   01/29/2021 -  Anti-estrogen oral therapy   anastrozole  and palbociclib     01/29/2023 Genetic Testing   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  Report date is 01/29/2023.   The Multi-Cancer + RNA Panel offered by [facility] includes sequencing and/or deletion/duplication analysis of the following 70 genes:  AIP*, ALK, APC*, ATM*, AXIN2*, BAP1*, BARD1*, BLM*, BMPR1A*, BRCA1*, BRCA2*, BRIP1*, CDC73*, CDH1*, CDK4, CDKN1B*, CDKN2A, CHEK2*, CTNNA1*, DICER1*, EPCAM (del/dup only), EGFR, FH*, FLCN*, GREM1 (promoter dup only), HOXB13, KIT, LZTR1, MAX*, MBD4, MEN1*, MET, MITF, MLH1*, MSH2*, MSH3*, MSH6*, MUTYH*, NF1*, NF2*, NTHL1*, PALB2*, PDGFRA, PMS2*, POLD1*, POLE*, POT1*, PRKAR1A*, PTCH1*, PTEN*, RAD51C*, RAD51D*, RB1*, RET, SDHA* (sequencing only), SDHAF2*, SDHB*, SDHC*, SDHD*, SMAD4*, SMARCA4*, SMARCB1*, SMARCE1*, STK11*, SUFU*, TMEM127*, TP53*, TSC1*, TSC2*, VHL*. RNA analysis is performed for * genes.   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  Report date is 01/29/2023.   The Multi-Cancer + RNA Panel offered by [facility] includes sequencing and/or deletion/duplication analysis of the following 70 genes:  AIP*, ALK, APC*, ATM*, AXIN2*, BAP1*, BARD1*, BLM*, BMPR1A*, BRCA1*, BRCA2*, BRIP1*, CDC73*, CDH1*, CDK4, CDKN1B*, CDKN2A, CHEK2*, CTNNA1*, DICER1*, EPCAM (del/dup only), EGFR, FH*, FLCN*, GREM1 (promoter dup only), HOXB13, KIT, LZTR1, MAX*, MBD4, MEN1*, MET, MITF, MLH1*, MSH2*, MSH3*, MSH6*, MUTYH*, NF1*, NF2*, NTHL1*,  PALB2*, PDGFRA, PMS2*, POLD1*, POLE*, POT1*, PRKAR1A*, PTCH1*, PTEN*, RAD51C*, RAD51D*, RB1*, RET,  SDHA* (sequencing only), SDHAF2*, SDHB*, SDHC*, SDHD*, SMAD4*, SMARCA4*, SMARCB1*, SMARCE1*, STK11*, SUFU*, TMEM127*, TP53*, TSC1*, TSC2*, VHL*. RNA analysis is performed for * genes.     CHIEF COMPLIANT: Cycle 8 Enhertu   HISTORY OF PRESENT ILLNESS: History of Present Illness Erica Wood is a 58 year old female with a history of breast cancer who presents with eye irritation and memory issues.  She experiences persistent redness and irritation in her eye, with constant watering. Numbing drops provide temporary relief, but the effects are diminishing. She has difficulty handling sunlight.  She is experiencing memory issues, describing it as difficulty remembering things and refers to it as 'chemo brain'. She questions if her memory issues are related to her current medication. No other cognitive symptoms are present.     ALLERGIES:  is allergic to bovine (beef) protein-containing drug products, latex, sulfa antibiotics, and tape.  MEDICATIONS:  Current Outpatient Medications  Medication Sig Dispense Refill   acetaminophen  (TYLENOL ) 500 MG tablet Take 1,000 mg by mouth as needed for moderate pain (pain score 4-6) or headache.     ALPRAZolam  (XANAX ) 0.25 MG tablet Take 1-2 tabs (0.25mg -0.50mg ) 30-60 minutes before procedure. May repeat if needed.Do not drive. 4 tablet 0   amphetamine -dextroamphetamine (ADDERALL XR) 20 MG 24 hr capsule Take 1 capsule (20 mg total) by mouth daily. 30 capsule 0   Ascorbic Acid (VITAMIN C PO) Take 1,000 mg by mouth daily.     Cholecalciferol (VITAMIN D3) 75 MCG (3000 UT) TABS Take 3,000 Units by mouth daily.     diclofenac  Sodium (VOLTAREN ) 1 % GEL Research Patient: Apply 0.5 grams (1 fingertip) to each hand and each foot twice daily for up to 12 weeks 400 g 0   estradiol  (ESTRACE ) 0.1 MG/GM vaginal cream INSERT 1 APPLICATORFUL VAGINALLY 3 TIMES A WEEK 42.5  g 12   famotidine (PEPCID) 20 MG tablet Take 20 mg by mouth at bedtime.     fluconazole  (DIFLUCAN ) 100 MG tablet Take 1 tablet (100 mg total) by mouth daily. 5 tablet 0   fluconazole  (DIFLUCAN ) 100 MG tablet Take 1 tablet (100 mg total) by mouth 2 (two) times daily for 7 days. 14 tablet 0   fluorometholone (FML) 0.1 % ophthalmic suspension 1 drop 2 (two) times daily.     hydroxychloroquine  (PLAQUENIL ) 200 MG tablet TAKE 1 TABLET(200 MG) BY MOUTH DAILY 90 tablet 0   lidocaine -prilocaine  (EMLA ) cream Apply to affected area once 30 g 3   Magnesium  Citrate 100 MG TABS Take 1 tablet by mouth daily as needed. 30 tablet 3   minoxidil (LONITEN) 2.5 MG tablet Take 1.25 mg by mouth daily.     ondansetron  (ZOFRAN ) 8 MG tablet Take 1 tablet (8 mg total) by mouth every 8 (eight) hours as needed for nausea or vomiting. 30 tablet 6   oxyCODONE -acetaminophen  (PERCOCET/ROXICET) 5-325 MG tablet Take 1 tablet by mouth every 8 (eight) hours as needed for severe pain (pain score 7-10). 30 tablet 0   prochlorperazine  (COMPAZINE ) 10 MG tablet Take 1 tablet (10 mg total) by mouth every 8 (eight) hours as needed for nausea or vomiting. 30 tablet 6   spironolactone (ALDACTONE) 25 MG tablet Take 25 mg by mouth daily as needed (swelling).     traMADol  (ULTRAM ) 50 MG tablet Take 1-2 tablets (50-100 mg total) by mouth every 6 (six) hours as needed. 60 tablet 0   tretinoin (RETIN-A) 0.1 % cream Apply 1 application  topically at bedtime as needed (acne).  triamterene -hydrochlorothiazide (DYAZIDE) 37.5-25 MG capsule TAKE 1 CAPSULE BY MOUTH DAILY 90 capsule 1   warfarin (COUMADIN ) 5 MG tablet TAKE 1 TABLET(5 MG) BY MOUTH DAILY 30 tablet PRN   No current facility-administered medications for this visit.    PHYSICAL EXAMINATION: ECOG PERFORMANCE STATUS: 1 - Symptomatic but completely ambulatory  Vitals:   08/19/24 1345  BP: (!) 113/54  Pulse: 66  Resp: 18  Temp: 97.7 F (36.5 C)  SpO2: 100%   Filed Weights    08/19/24 1345  Weight: 113 lb (51.3 kg)    Physical Exam   (exam performed in the presence of a chaperone)  LABORATORY DATA:  I have reviewed the data as listed    Latest Ref Rng & Units 07/29/2024    2:14 PM 07/08/2024    7:43 AM 06/17/2024    1:35 PM  CMP  Glucose 70 - 99 mg/dL 87  80  84   BUN 6 - 20 mg/dL 11  11  10    Creatinine 0.44 - 1.00 mg/dL 9.21  9.13  9.21   Sodium 135 - 145 mmol/L 137  137  138   Potassium 3.5 - 5.1 mmol/L 3.9  3.9  4.0   Chloride 98 - 111 mmol/L 104  104  104   CO2 22 - 32 mmol/L 30  30  31    Calcium 8.9 - 10.3 mg/dL 8.8  8.8  8.9   Total Protein 6.5 - 8.1 g/dL 6.5  6.0  6.3   Total Bilirubin 0.0 - 1.2 mg/dL 0.4  0.3  0.4   Alkaline Phos 38 - 126 U/L 99  90  95   AST 15 - 41 U/L 31  27  34   ALT 0 - 44 U/L 22  21  32     Lab Results  Component Value Date   WBC 4.6 08/19/2024   HGB 12.6 08/19/2024   HCT 37.0 08/19/2024   MCV 96.9 08/19/2024   PLT 133 (L) 08/19/2024   NEUTROABS 2.8 08/19/2024    ASSESSMENT & PLAN:  Malignant neoplasm of lower-outer quadrant of right breast of female, estrogen receptor positive (HCC) 08/04/2020: Right breast biopsy T2 N0 M1 stage IV IDC grade 3, triple positive with a Ki-67 of 40%, multiple liver lesions biopsy was positive for breast cancer ER/PR positive HER2 negative with a Ki-67 30% 08/31/2020-12/14/2020: TCHP x6 01/29/2021: Letrozole  and palbociclib    11/29/2022-06/05/2023: Olaparib  07/13/2023-03/24/2023: Xeloda      10/24/2022: CT CAP: Multiple new hepatic lesions measuring up to 1.5 cm consistent with metastatic disease, stable 5 mm nodule left costophrenic sulcus 12/05/2021: Guardant[facility]: ATM gene mutation (possible treatment option olaparib ), MSI high: Not detected.  Will refer the patient to genetic testing   PET CT scan 03/03/2023: Widespread hypermetabolic metastatic disease within the liver is similar to previous PET/CT 06/02/2023: Interval progression of hepatic metastases.  Progression of bone metastases.   Suspicion of peritoneal disease.   06/13/2023: [facility]ant[facility]: ATM, T p53, FGF R1 amplification, TMB 4.73 mutations 03/30/2024: Right breast biopsy: IDC grade 2, ER 90%, PR 10%, Ki67 40%, HER2 2+ by IHC, FISH positive ratio 2.97 ---------------------------------------------------------------------------------------------------------------------------- Current treatment: Enhertu  cycle 8 Chemo toxicities: Nausea: advised AM Zofran  and Pepcid: I reduced the dosage of Enhertu  for cycle 2 Fatigue Chemo induced anemia: Mild and monitoring closely   Corneal abrasion: Patient seen by ophthalmology Echocardiogram 03/24/2024: EF 55 to 60% PET/CT scan planned for 08/26/2024 We will follow her with the next treatment to discuss her PET scan result. ------------------------------------- Assessment  and Plan Assessment & Plan Metastatic ER-positive breast cancer with hepatic involvement Undergoing treatment with stable hemoglobin and white blood cell count, platelets at 100. Cancer marker test pending, scan scheduled for next week. - Set standing order for cancer marker test at every visit. - Review scan results in follow-up appointment scheduled a couple of weeks after the scan.  Chemotherapy-induced cognitive impairment Experiencing memory loss consistent with chemotherapy-induced cognitive impairment, severity increasing but expected to improve post-chemotherapy. - Discussed potential improvement in cognitive function after cessation of chemotherapy.      No orders of the defined types were placed in this encounter.  The patient has a good understanding of the overall plan. she agrees with it. she will call with any problems that may develop before the next visit here.  I personally spent a total of 30 minutes in the care of the patient today including preparing to see the patient, getting/reviewing separately obtained history, performing a medically appropriate exam/evaluation, counseling and  educating, placing orders, referring and communicating with other health care professionals, documenting clinical information in the EHR, independently interpreting results, communicating results, and coordinating care.   Viinay K Takeya Marquis, MD 08/19/24

## 2024-08-19 NOTE — Assessment & Plan Note (Signed)
 08/04/2020: Right breast biopsy T2 N0 M1 stage IV IDC grade 3, triple positive with a Ki-67 of 40%, multiple liver lesions biopsy was positive for breast cancer ER/PR positive HER2 negative with a Ki-67 30% 08/31/2020-12/14/2020: TCHP x6 01/29/2021: Letrozole  and palbociclib    11/29/2022-06/05/2023: Olaparib  07/13/2023-03/24/2023: Xeloda      10/24/2022: CT CAP: Multiple new hepatic lesions measuring up to 1.5 cm consistent with metastatic disease, stable 5 mm nodule left costophrenic sulcus 12/05/2021: [facility]: ATM gene mutation (possible treatment option olaparib ), MSI high: Not detected.  Will refer the patient to genetic testing   PET CT scan 03/03/2023: Widespread hypermetabolic metastatic disease within the liver is similar to previous PET/CT 06/02/2023: Interval progression of hepatic metastases.  Progression of bone metastases.  Suspicion of peritoneal disease.   06/13/2023: [facility]: ATM, T p53, FGF R1 amplification, TMB 4.73 mutations 03/30/2024: Right breast biopsy: IDC grade 2, ER 90%, PR 10%, Ki67 40%, HER2 2+ by IHC, FISH positive ratio 2.97 ---------------------------------------------------------------------------------------------------------------------------- Current treatment: Enhertu  cycle 8 Chemo toxicities: Nausea: advised AM Zofran  and Pepcid: I reduced the dosage of Enhertu  for cycle 2 Fatigue Chemo induced anemia: Mild and monitoring closely   Echocardiogram 03/24/2024: EF 55 to 60% PET/CT scan planned for 08/26/2024

## 2024-08-19 NOTE — Patient Instructions (Signed)
 [facility] - A DEPT OF [facility]  Discharge Instructions: Thank you for choosing [facility] to provide your oncology and hematology care.   If you have a lab appointment with the Cancer Center, please go directly to the Cancer Center and check in at the registration area.   Wear comfortable clothing and clothing appropriate for easy access to any Portacath or PICC line.   We strive to give you quality time with your provider. You may need to reschedule your appointment if you arrive late (15 or more minutes).  Arriving late affects you and other patients whose appointments are after yours.  Also, if you miss three or more appointments without notifying the office, you may be dismissed from the clinic at the provider's discretion.      For prescription refill requests, have your pharmacy contact our office and allow 72 hours for refills to be completed.    Today you received the following chemotherapy and/or immunotherapy agents enhertu      To help prevent nausea and vomiting after your treatment, we encourage you to take your nausea medication as directed.  BELOW ARE SYMPTOMS THAT SHOULD BE REPORTED IMMEDIATELY: *FEVER GREATER THAN 100.4 F (38 C) OR HIGHER *CHILLS OR SWEATING *NAUSEA AND VOMITING THAT IS NOT CONTROLLED WITH YOUR NAUSEA MEDICATION *UNUSUAL SHORTNESS OF BREATH *UNUSUAL BRUISING OR BLEEDING *URINARY PROBLEMS (pain or burning when urinating, or frequent urination) *BOWEL PROBLEMS (unusual diarrhea, constipation, pain near the anus) TENDERNESS IN MOUTH AND THROAT WITH OR WITHOUT PRESENCE OF ULCERS (sore throat, sores in mouth, or a toothache) UNUSUAL RASH, SWELLING OR PAIN  UNUSUAL VAGINAL DISCHARGE OR ITCHING   Items with * indicate a potential emergency and should be followed up as soon as possible or go to the Emergency Department if any problems should occur.  Please show the CHEMOTHERAPY ALERT CARD or IMMUNOTHERAPY  ALERT CARD at check-in to the Emergency Department and triage nurse.  Should you have questions after your visit or need to cancel or reschedule your appointment, please contact [facility] - A DEPT [facility]  Dept: [phone]  and follow the prompts.  Office hours are 8:00 a.m. to 4:30 p.m. Monday - Friday. Please note that voicemails left after 4:00 p.m. may not be returned until the following business day.  We are closed weekends and major holidays. You have access to a nurse at all times for urgent questions. Please call the main number to the clinic Dept: [phone] and follow the prompts.   For any non-urgent questions, you may also contact your provider using MyChart. We now offer e-Visits for anyone 60 and older to request care online for non-urgent symptoms. For details visit [email or web address].   Also download the MyChart app! Go to the app store, search "MyChart", open the app, select [place], and log in with your MyChart username and password.

## 2024-08-20 LAB — CANCER ANTIGEN 27.29: CA 27.29: 907.1 U/mL — ABNORMAL HIGH (ref 0.0–38.6)

## 2024-08-26 ENCOUNTER — Ambulatory Visit (HOSPITAL_COMMUNITY)
Admission: RE | Admit: 2024-08-26 | Discharge: 2024-08-26 | Disposition: A | Discharge status: Home / Self Care | Source: Ambulatory Visit | Attending: Hematology and Oncology | Admitting: Hematology and Oncology

## 2024-08-26 DIAGNOSIS — C787 Secondary malignant neoplasm of liver and intrahepatic bile duct: Secondary | ICD-10-CM | POA: Diagnosis present

## 2024-08-26 DIAGNOSIS — Z170 Estrogen receptor positive status [ER+]: Secondary | ICD-10-CM | POA: Insufficient documentation

## 2024-08-26 DIAGNOSIS — C50511 Malignant neoplasm of lower-outer quadrant of right female breast: Secondary | ICD-10-CM | POA: Diagnosis present

## 2024-08-26 LAB — GLUCOSE, CAPILLARY: Glucose-Capillary: 102 mg/dL — ABNORMAL HIGH (ref 70–99)

## 2024-08-26 MED ORDER — FLUDEOXYGLUCOSE F - 18 (FDG) INJECTION
5.5000 | Freq: Once | INTRAVENOUS | Status: AC | PRN
  Administered 2024-08-26: 5.6 via INTRAVENOUS

## 2024-09-07 ENCOUNTER — Other Ambulatory Visit: Payer: Self-pay

## 2024-09-07 MED ORDER — AMPHETAMINE-DEXTROAMPHET ER 20 MG PO CP24: 20.0000 mg | ORAL_CAPSULE | Freq: Every day | ORAL | 0 refills | Status: DC

## 2024-09-07 NOTE — Telephone Encounter (Signed)
 Patient called the office for a refill on her Adderall  Last Fill: 08/09/2024  Next Visit: 10/21/2024  Last Visit: 04/08/2024  Dx:  SLE   Current Dose per office note on 04/08/2024: dose not mentioned  Okay to refill Adderall?

## 2024-09-08 MED FILL — Fosaprepitant Dimeglumine For IV Infusion 150 MG (Base Eq): INTRAVENOUS | Qty: 5 | Status: AC

## 2024-09-09 ENCOUNTER — Inpatient Hospital Stay: Admitting: Hematology and Oncology

## 2024-09-09 ENCOUNTER — Inpatient Hospital Stay

## 2024-09-09 ENCOUNTER — Telehealth: Payer: Self-pay | Admitting: *Deleted

## 2024-09-09 VITALS — BP 118/71 | HR 70 | Temp 98.1°F | Resp 18 | Wt 113.2 lb

## 2024-09-09 DIAGNOSIS — Z170 Estrogen receptor positive status [ER+]: Secondary | ICD-10-CM

## 2024-09-09 DIAGNOSIS — C787 Secondary malignant neoplasm of liver and intrahepatic bile duct: Secondary | ICD-10-CM

## 2024-09-09 DIAGNOSIS — Z5112 Encounter for antineoplastic immunotherapy: Secondary | ICD-10-CM | POA: Diagnosis not present

## 2024-09-09 DIAGNOSIS — C50511 Malignant neoplasm of lower-outer quadrant of right female breast: Secondary | ICD-10-CM

## 2024-09-09 LAB — CMP (CANCER CENTER ONLY)
ALT: 26 U/L (ref 0–44)
AST: 31 U/L (ref 15–41)
Albumin: 3.9 g/dL (ref 3.5–5.0)
Alkaline Phosphatase: 117 U/L (ref 38–126)
Anion gap: 5 (ref 5–15)
BUN: 12 mg/dL (ref 6–20)
CO2: 29 mmol/L (ref 22–32)
Calcium: 8.9 mg/dL (ref 8.9–10.3)
Chloride: 103 mmol/L (ref 98–111)
Creatinine: 0.78 mg/dL (ref 0.44–1.00)
GFR, Estimated: >60 mL/min (ref >60)
Glucose, Bld: 82 mg/dL (ref 70–99)
Potassium: 4 mmol/L (ref 3.5–5.1)
Sodium: 137 mmol/L (ref 135–145)
Total Bilirubin: 0.4 mg/dL (ref 0.0–1.2)
Total Protein: 6.6 g/dL (ref 6.5–8.1)

## 2024-09-09 LAB — CBC WITH DIFFERENTIAL (CANCER CENTER ONLY)
Abs Immature Granulocytes: 0 K/uL (ref 0.00–0.07)
Basophils Absolute: 0.1 K/uL (ref 0.0–0.1)
Basophils Relative: 1 %
Eosinophils Absolute: 0 K/uL (ref 0.0–0.5)
Eosinophils Relative: 0 %
HCT: 36 % (ref 36.0–46.0)
Hemoglobin: 12.3 g/dL (ref 12.0–15.0)
Immature Granulocytes: 0 %
Lymphocytes Relative: 15 %
Lymphs Abs: 0.9 K/uL (ref 0.7–4.0)
MCH: 33.4 pg (ref 26.0–34.0)
MCHC: 34.2 g/dL (ref 30.0–36.0)
MCV: 97.8 fL (ref 80.0–100.0)
Monocytes Absolute: 0.7 K/uL (ref 0.1–1.0)
Monocytes Relative: 12 %
Neutro Abs: 4.4 K/uL (ref 1.7–7.7)
Neutrophils Relative %: 72 %
Platelet Count: 137 K/uL — ABNORMAL LOW (ref 150–400)
RBC: 3.68 MIL/uL — ABNORMAL LOW (ref 3.87–5.11)
RDW: 14 % (ref 11.5–15.5)
WBC Count: 6.1 K/uL (ref 4.0–10.5)
nRBC: 0 % (ref 0.0–0.2)

## 2024-09-09 MED ORDER — SODIUM CHLORIDE 0.9% FLUSH
10.0000 mL | INTRAVENOUS | Status: DC | PRN
  Administered 2024-09-09: 10 mL

## 2024-09-09 MED ORDER — DIPHENHYDRAMINE HCL 25 MG PO CAPS
25.0000 mg | ORAL_CAPSULE | Freq: Once | ORAL | Status: AC
  Administered 2024-09-09: 25 mg via ORAL
  Filled 2024-09-09: qty 1

## 2024-09-09 MED ORDER — DEXTROSE 5 % IV SOLN: INTRAVENOUS | Status: DC

## 2024-09-09 MED ORDER — SODIUM CHLORIDE 0.9 % IV SOLN
150.0000 mg | Freq: Once | INTRAVENOUS | Status: AC
  Administered 2024-09-09: 150 mg via INTRAVENOUS
  Filled 2024-09-09: qty 150

## 2024-09-09 MED ORDER — PALONOSETRON HCL INJECTION 0.25 MG/5ML
0.2500 mg | Freq: Once | INTRAVENOUS | Status: AC
  Administered 2024-09-09: 0.25 mg via INTRAVENOUS
  Filled 2024-09-09: qty 5

## 2024-09-09 MED ORDER — ACETAMINOPHEN 325 MG PO TABS
650.0000 mg | ORAL_TABLET | Freq: Once | ORAL | Status: AC
  Administered 2024-09-09: 650 mg via ORAL
  Filled 2024-09-09: qty 2

## 2024-09-09 MED ORDER — FAM-TRASTUZUMAB DERUXTECAN-NXKI CHEMO 100 MG IV SOLR
4.4000 mg/kg | Freq: Once | INTRAVENOUS | Status: AC
  Administered 2024-09-09: 240 mg via INTRAVENOUS
  Filled 2024-09-09: qty 12

## 2024-09-09 NOTE — Telephone Encounter (Signed)
 Received call from pt with complaint of cough and sinus congestion.  Pt denies fever, body aches or chills at this time. Pt requesting if okay to come in for appt today.  RN reviewed with MD who states he would still like for pt to come in.  Pt educated and verbalized understanding.

## 2024-09-09 NOTE — Assessment & Plan Note (Signed)
 08/04/2020: Right breast biopsy T2 N0 M1 stage IV IDC grade 3, triple positive with a Ki-67 of 40%, multiple liver lesions biopsy was positive for breast cancer ER/PR positive HER2 negative with a Ki-67 30% 08/31/2020-12/14/2020: TCHP x6 01/29/2021: Letrozole  and palbociclib    11/29/2022-06/05/2023: Olaparib  07/13/2023-03/24/2023: Xeloda      10/24/2022: CT CAP: Multiple new hepatic lesions measuring up to 1.5 cm consistent with metastatic disease, stable 5 mm nodule left costophrenic sulcus 12/05/2021: [facility]: ATM gene mutation (possible treatment option olaparib ), MSI high: Not detected.  Will refer the patient to genetic testing   PET CT scan 03/03/2023: Widespread hypermetabolic metastatic disease within the liver is similar to previous PET/CT 06/02/2023: Interval progression of hepatic metastases.  Progression of bone metastases.  Suspicion of peritoneal disease.   06/13/2023: [facility]: ATM, T p53, FGF R1 amplification, TMB 4.73 mutations 03/30/2024: Right breast biopsy: IDC grade 2, ER 90%, PR 10%, Ki67 40%, HER2 2+ by IHC, FISH positive ratio 2.97 ---------------------------------------------------------------------------------------------------------------------------- Current treatment: Enhertu  cycle 8 Chemo toxicities: Nausea: advised AM Zofran  and Pepcid: I reduced the dosage of Enhertu  for cycle 2 Fatigue Chemo induced anemia: Mild and monitoring closely Echocardiogram 03/24/2024: EF 55 to 60% PET/CT scan 08/26/2024: Improvement in the metastatic disease throughout the liver and abdomen  Return to clinic every 3 weeks for labs and follow-up and Enhertu  infusion

## 2024-09-09 NOTE — Progress Notes (Signed)
 Patient Care Team: Hughie Sharper, MD as PCP - General (Family Medicine) Rene, Rocky RAMAN, MD as Referring Physician (Oncology) Tyree Nanetta SAILOR, RN as Oncology Nurse Navigator Ebbie Cough, MD as Consulting Physician (General Surgery) Dewey Rush, MD as Consulting Physician (Radiation Oncology) Dolphus Reiter, MD as Consulting Physician (Rheumatology) Lenon Oneil BRAVO, MD as Consulting Physician (Obstetrics and Gynecology) Rolan Ezra RAMAN, MD as Consulting Physician (Cardiology) Armbruster, Elspeth SQUIBB, MD as Consulting Physician (Gastroenterology) Jeannetta Carlin Lenis, MD as Referring Physician (Ophthalmology) Jeannetta Carlin Lenis, MD as Referring Physician (Ophthalmology) Odean Potts, MD as Consulting Physician (Hematology and Oncology) Lucila Rush LABOR, RPH-CPP as Pharmacist (Hematology and Oncology)  DIAGNOSIS:  Encounter Diagnosis  Name Primary?   Malignant neoplasm of lower-outer quadrant of right breast of female, estrogen receptor positive (HCC) Yes    SUMMARY OF ONCOLOGIC HISTORY: Oncology History  Malignant neoplasm of lower-outer quadrant of right breast of female, estrogen receptor positive (HCC)  08/04/2020 Initial Diagnosis   Right breast lower outer quadrant biopsy 08/04/2020 for a clinical T2 N0 M1, stage IV invasive ductal carcinoma, grade 3, triple positive, with an MIB-1 of 40%. abdominal MRI 08/24/2020 shows multiple liver lesions highly suspicious for liver metastases. Liver biopsy 09/20/2020 positive for carcinoma, prognostic panel estrogen and progesterone receptor positive, but HER-2 negative (0); MIB-1 of 30%   08/31/2020 - 12/14/2020 Chemotherapy   Neoadjuvant chemotherapy consisting of docetaxel , carboplatin , trastuzumab  and pertuzumab  every 21 days x 6    12/26/2020 Imaging   breast MRI 12/26/2020 showed right breast index lesion decreased to 0.8 cm; 2 additional masses in the right breast stable abdominal MRI 01/29/2021 shows no active disease in  the liver    Chemotherapy   Herceptin  Maintenance initially q 3 weeks, now q 4 weeks   01/29/2021 -  Anti-estrogen oral therapy   anastrozole  and palbociclib     01/29/2023 Genetic Testing   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  Report date is 01/29/2023.   The Multi-Cancer + RNA Panel offered by [facility] includes sequencing and/or deletion/duplication analysis of the following 70 genes:  AIP*, ALK, APC*, ATM*, AXIN2*, BAP1*, BARD1*, BLM*, BMPR1A*, BRCA1*, BRCA2*, BRIP1*, CDC73*, CDH1*, CDK4, CDKN1B*, CDKN2A, CHEK2*, CTNNA1*, DICER1*, EPCAM (del/dup only), EGFR, FH*, FLCN*, GREM1 (promoter dup only), HOXB13, KIT, LZTR1, MAX*, MBD4, MEN1*, MET, MITF, MLH1*, MSH2*, MSH3*, MSH6*, MUTYH*, NF1*, NF2*, NTHL1*, PALB2*, PDGFRA, PMS2*, POLD1*, POLE*, POT1*, PRKAR1A*, PTCH1*, PTEN*, RAD51C*, RAD51D*, RB1*, RET, SDHA* (sequencing only), SDHAF2*, SDHB*, SDHC*, SDHD*, SMAD4*, SMARCA4*, SMARCB1*, SMARCE1*, STK11*, SUFU*, TMEM127*, TP53*, TSC1*, TSC2*, VHL*. RNA analysis is performed for * genes.   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  Report date is 01/29/2023.   The Multi-Cancer + RNA Panel offered by [facility] includes sequencing and/or deletion/duplication analysis of the following 70 genes:  AIP*, ALK, APC*, ATM*, AXIN2*, BAP1*, BARD1*, BLM*, BMPR1A*, BRCA1*, BRCA2*, BRIP1*, CDC73*, CDH1*, CDK4, CDKN1B*, CDKN2A, CHEK2*, CTNNA1*, DICER1*, EPCAM (del/dup only), EGFR, FH*, FLCN*, GREM1 (promoter dup only), HOXB13, KIT, LZTR1, MAX*, MBD4, MEN1*, MET, MITF, MLH1*, MSH2*, MSH3*, MSH6*, MUTYH*, NF1*, NF2*, NTHL1*,  PALB2*, PDGFRA, PMS2*, POLD1*, POLE*, POT1*, PRKAR1A*, PTCH1*, PTEN*, RAD51C*, RAD51D*, RB1*, RET,  SDHA* (sequencing only), SDHAF2*, SDHB*, SDHC*, SDHD*, SMAD4*, SMARCA4*, SMARCB1*, SMARCE1*, STK11*, SUFU*, TMEM127*, TP53*, TSC1*, TSC2*, VHL*. RNA analysis is performed for * genes.     CHIEF COMPLIANT: Enhertu , follow-up of recent PET CT scan  HISTORY OF PRESENT ILLNESS: History of Present Illness Erica Wood is a 58 year old female with metastatic breast cancer who presents for follow-up after treatment.  She is undergoing treatment for metastatic breast cancer with previous liver and bone involvement. Recent imaging shows improvement in the skeletal system with no significant activity. Her current medication regimen is ongoing. She feels well and is planning a hunting trip to [place] , indicating a good quality of life. She has a cold but no fever.     ALLERGIES:  is allergic to bovine (beef) protein-containing drug products, latex, sulfa antibiotics, and tape.  MEDICATIONS:  Current Outpatient Medications  Medication Sig Dispense Refill   acetaminophen  (TYLENOL ) 500 MG tablet Take 1,000 mg by mouth as needed for moderate pain (pain score 4-6) or headache.     ALPRAZolam  (XANAX ) 0.25 MG tablet Take 1-2 tabs (0.25mg -0.50mg ) 30-60 minutes before procedure. May repeat if needed.Do not drive. 4 tablet 0   amphetamine -dextroamphetamine (ADDERALL XR) 20 MG 24 hr capsule Take 1 capsule (20 mg total) by mouth daily. 30 capsule 0   Ascorbic Acid (VITAMIN C PO) Take 1,000 mg by mouth daily.     Cholecalciferol (VITAMIN D3) 75 MCG (3000 UT) TABS Take 3,000 Units by mouth daily.     diclofenac  Sodium (VOLTAREN ) 1 % GEL Research Patient: Apply 0.5 grams (1 fingertip) to each hand and each foot twice daily for up to 12 weeks 400 g 0   estradiol  (ESTRACE ) 0.1 MG/GM vaginal cream INSERT 1 APPLICATORFUL VAGINALLY 3 TIMES A WEEK 42.5 g 12   famotidine (PEPCID) 20 MG tablet Take 20 mg by mouth at  bedtime.     fluconazole  (DIFLUCAN ) 100 MG tablet Take 1 tablet (100 mg total) by mouth daily. 5 tablet 0   fluorometholone (FML) 0.1 % ophthalmic suspension 1 drop 2 (two) times daily.     hydroxychloroquine  (PLAQUENIL ) 200 MG tablet TAKE 1 TABLET(200 MG) BY MOUTH DAILY 90 tablet 0   lidocaine -prilocaine  (EMLA ) cream Apply to affected area once 30 g 3   Magnesium  Citrate 100 MG TABS Take 1 tablet by mouth daily as needed. 30 tablet 3   minoxidil (LONITEN) 2.5 MG tablet Take 1.25 mg by mouth daily.     ondansetron  (ZOFRAN ) 8 MG tablet Take 1 tablet (8 mg total) by mouth every 8 (eight) hours as needed for nausea or vomiting. 30 tablet 6   oxyCODONE -acetaminophen  (PERCOCET/ROXICET) 5-325 MG tablet Take 1 tablet by mouth every 8 (eight) hours as needed for severe pain (pain score 7-10). 30 tablet 0   prochlorperazine  (COMPAZINE ) 10 MG tablet Take 1 tablet (10 mg total) by mouth every 8 (eight) hours as needed for nausea or vomiting. 30 tablet 6   spironolactone (ALDACTONE) 25 MG tablet Take 25 mg by mouth daily as needed (swelling).     traMADol  (ULTRAM ) 50 MG tablet Take 1-2 tablets (50-100 mg total) by mouth every 6 (six) hours as needed. 60 tablet 0   tretinoin (RETIN-A) 0.1 % cream Apply 1 application  topically at bedtime as needed (acne).     triamterene -hydrochlorothiazide (DYAZIDE) 37.5-25 MG capsule TAKE 1 CAPSULE BY MOUTH DAILY 90 capsule 1   warfarin (COUMADIN ) 5 MG tablet TAKE 1 TABLET(5 MG) BY MOUTH DAILY 30 tablet PRN  No current facility-administered medications for this visit.   Facility-Administered Medications Ordered in Other Visits  Medication Dose Route Frequency Provider Last Rate Last Admin   dextrose  5 % solution   Intravenous Continuous Odean Potts, MD 10 mL/hr at 09/09/24 1603 New Bag at 09/09/24 1603   fam-trastuzumab deruxtecan-nxki  (ENHERTU ) 240 mg in dextrose  5 % 100 mL chemo infusion  4.4 mg/kg (Treatment Plan Recorded) Intravenous Once Odean Potts, MD 224 mL/hr  at 09/09/24 1709 240 mg at 09/09/24 1709   sodium chloride  flush (NS) 0.9 % injection 10 mL  10 mL Intracatheter PRN Odean Potts, MD        PHYSICAL EXAMINATION: ECOG PERFORMANCE STATUS: 1 - Symptomatic but completely ambulatory  LABORATORY DATA:  I have reviewed the data as listed    Latest Ref Rng & Units 09/09/2024    2:03 PM 08/19/2024    1:22 PM 07/29/2024    2:14 PM  CMP  Glucose 70 - 99 mg/dL 82  846  87   BUN 6 - 20 mg/dL 12  12  11    Creatinine 0.44 - 1.00 mg/dL 9.21  9.20  9.21   Sodium 135 - 145 mmol/L 137  136  137   Potassium 3.5 - 5.1 mmol/L 4.0  3.7  3.9   Chloride 98 - 111 mmol/L 103  103  104   CO2 22 - 32 mmol/L 29  31  30    Calcium 8.9 - 10.3 mg/dL 8.9  9.1  8.8   Total Protein 6.5 - 8.1 g/dL 6.6  6.6  6.5   Total Bilirubin 0.0 - 1.2 mg/dL 0.4  0.5  0.4   Alkaline Phos 38 - 126 U/L 117  103  99   AST 15 - 41 U/L 31  30  31    ALT 0 - 44 U/L 26  21  22      Lab Results  Component Value Date   WBC 6.1 09/09/2024   HGB 12.3 09/09/2024   HCT 36.0 09/09/2024   MCV 97.8 09/09/2024   PLT 137 (L) 09/09/2024   NEUTROABS 4.4 09/09/2024    ASSESSMENT & PLAN:  Malignant neoplasm of lower-outer quadrant of right breast of female, estrogen receptor positive (HCC) 08/04/2020: Right breast biopsy T2 N0 M1 stage IV IDC grade 3, triple positive with a Ki-67 of 40%, multiple liver lesions biopsy was positive for breast cancer ER/PR positive HER2 negative with a Ki-67 30% 08/31/2020-12/14/2020: TCHP x6 01/29/2021: Letrozole  and palbociclib    11/29/2022-06/05/2023: Olaparib  07/13/2023-03/24/2023: Xeloda      10/24/2022: CT CAP: Multiple new hepatic lesions measuring up to 1.5 cm consistent with metastatic disease, stable 5 mm nodule left costophrenic sulcus 12/05/2021: [facility]: ATM gene mutation (possible treatment option olaparib ), MSI high: Not detected.  Will refer the patient to genetic testing   PET CT scan 03/03/2023: Widespread hypermetabolic metastatic disease within  the liver is similar to previous PET/CT 06/02/2023: Interval progression of hepatic metastases.  Progression of bone metastases.  Suspicion of peritoneal disease.   06/13/2023: [facility]: ATM, T p53, FGF R1 amplification, TMB 4.73 mutations 03/30/2024: Right breast biopsy: IDC grade 2, ER 90%, PR 10%, Ki67 40%, HER2 2+ by IHC, FISH positive ratio 2.97 ---------------------------------------------------------------------------------------------------------------------------- Current treatment: Enhertu  cycle 8 Chemo toxicities: Nausea: advised AM Zofran  and Pepcid: I reduced the dosage of Enhertu  for cycle 2 Fatigue Chemo induced anemia: Mild and monitoring closely Echocardiogram 03/24/2024: EF 55 to 60% PET/CT scan 08/26/2024: Improvement in the metastatic disease throughout the liver and abdomen  Return to  clinic every 3 weeks for labs and follow-up and Enhertu  infusion  ------------------------------------- Assessment and Plan Assessment & Plan Metastatic ER-positive breast cancer with liver and bone involvement Significant improvement with decreased disease extent. Liver lesions resolved, skeletal lesions improved. Normal blood work. - Proceed with scheduled infusion.      No orders of the defined types were placed in this encounter.  The patient has a good understanding of the overall plan. she agrees with it. she will call with any problems that may develop before the next visit here.  I personally spent a total of 30 minutes in the care of the patient today including preparing to see the patient, getting/reviewing separately obtained history, performing a medically appropriate exam/evaluation, counseling and educating, placing orders, referring and communicating with other health care professionals, documenting clinical information in the EHR, independently interpreting results, communicating results, and coordinating care.   Viinay K Amillion [place], MD 09/09/24

## 2024-09-09 NOTE — Patient Instructions (Signed)
 [facility] - A DEPT OF [facility]  Discharge Instructions: Thank you for choosing [facility] to provide your oncology and hematology care.   If you have a lab appointment with the Cancer Center, please go directly to the Cancer Center and check in at the registration area.   Wear comfortable clothing and clothing appropriate for easy access to any Portacath or PICC line.   We strive to give you quality time with your provider. You may need to reschedule your appointment if you arrive late (15 or more minutes).  Arriving late affects you and other patients whose appointments are after yours.  Also, if you miss three or more appointments without notifying the office, you may be dismissed from the clinic at the provider's discretion.      For prescription refill requests, have your pharmacy contact our office and allow 72 hours for refills to be completed.    Today you received the following chemotherapy and/or immunotherapy agents enhertu      To help prevent nausea and vomiting after your treatment, we encourage you to take your nausea medication as directed.  BELOW ARE SYMPTOMS THAT SHOULD BE REPORTED IMMEDIATELY: *FEVER GREATER THAN 100.4 F (38 C) OR HIGHER *CHILLS OR SWEATING *NAUSEA AND VOMITING THAT IS NOT CONTROLLED WITH YOUR NAUSEA MEDICATION *UNUSUAL SHORTNESS OF BREATH *UNUSUAL BRUISING OR BLEEDING *URINARY PROBLEMS (pain or burning when urinating, or frequent urination) *BOWEL PROBLEMS (unusual diarrhea, constipation, pain near the anus) TENDERNESS IN MOUTH AND THROAT WITH OR WITHOUT PRESENCE OF ULCERS (sore throat, sores in mouth, or a toothache) UNUSUAL RASH, SWELLING OR PAIN  UNUSUAL VAGINAL DISCHARGE OR ITCHING   Items with * indicate a potential emergency and should be followed up as soon as possible or go to the Emergency Department if any problems should occur.  Please show the CHEMOTHERAPY ALERT CARD or IMMUNOTHERAPY  ALERT CARD at check-in to the Emergency Department and triage nurse.  Should you have questions after your visit or need to cancel or reschedule your appointment, please contact [facility] - A DEPT [facility]  Dept: [phone]  and follow the prompts.  Office hours are 8:00 a.m. to 4:30 p.m. Monday - Friday. Please note that voicemails left after 4:00 p.m. may not be returned until the following business day.  We are closed weekends and major holidays. You have access to a nurse at all times for urgent questions. Please call the main number to the clinic Dept: [phone] and follow the prompts.   For any non-urgent questions, you may also contact your provider using MyChart. We now offer e-Visits for anyone 60 and older to request care online for non-urgent symptoms. For details visit [email or web address].   Also download the MyChart app! Go to the app store, search "MyChart", open the app, select [place], and log in with your MyChart username and password.

## 2024-09-10 LAB — CANCER ANTIGEN 27.29: CA 27.29: 691.1 U/mL — ABNORMAL HIGH (ref 0.0–38.6)

## 2024-09-29 MED FILL — Fosaprepitant Dimeglumine For IV Infusion 150 MG (Base Eq): INTRAVENOUS | Qty: 5 | Status: AC

## 2024-09-30 ENCOUNTER — Inpatient Hospital Stay (HOSPITAL_BASED_OUTPATIENT_CLINIC_OR_DEPARTMENT_OTHER): Admitting: Hematology and Oncology

## 2024-09-30 ENCOUNTER — Inpatient Hospital Stay: Attending: Hematology and Oncology

## 2024-09-30 ENCOUNTER — Inpatient Hospital Stay

## 2024-09-30 VITALS — BP 123/61 | HR 66 | Temp 98.0°F | Resp 17 | Ht 64.0 in | Wt 114.0 lb

## 2024-09-30 DIAGNOSIS — C50511 Malignant neoplasm of lower-outer quadrant of right female breast: Secondary | ICD-10-CM

## 2024-09-30 DIAGNOSIS — C787 Secondary malignant neoplasm of liver and intrahepatic bile duct: Secondary | ICD-10-CM | POA: Insufficient documentation

## 2024-09-30 DIAGNOSIS — Z1732 Human epidermal growth factor receptor 2 negative status: Secondary | ICD-10-CM | POA: Diagnosis not present

## 2024-09-30 DIAGNOSIS — C7951 Secondary malignant neoplasm of bone: Secondary | ICD-10-CM | POA: Diagnosis not present

## 2024-09-30 DIAGNOSIS — Z170 Estrogen receptor positive status [ER+]: Secondary | ICD-10-CM

## 2024-09-30 DIAGNOSIS — Z79899 Other long term (current) drug therapy: Secondary | ICD-10-CM | POA: Diagnosis not present

## 2024-09-30 DIAGNOSIS — Z1721 Progesterone receptor positive status: Secondary | ICD-10-CM | POA: Insufficient documentation

## 2024-09-30 DIAGNOSIS — Z5112 Encounter for antineoplastic immunotherapy: Secondary | ICD-10-CM | POA: Diagnosis present

## 2024-09-30 LAB — CMP (CANCER CENTER ONLY)
ALT: 26 U/L (ref 0–44)
AST: 36 U/L (ref 15–41)
Albumin: 4 g/dL (ref 3.5–5.0)
Alkaline Phosphatase: 138 U/L — ABNORMAL HIGH (ref 38–126)
Anion gap: 8 (ref 5–15)
BUN: 13 mg/dL (ref 6–20)
CO2: 27 mmol/L (ref 22–32)
Calcium: 9.1 mg/dL (ref 8.9–10.3)
Chloride: 102 mmol/L (ref 98–111)
Creatinine: 0.83 mg/dL (ref 0.44–1.00)
GFR, Estimated: >60 mL/min (ref >60)
Glucose, Bld: 82 mg/dL (ref 70–99)
Potassium: 3.9 mmol/L (ref 3.5–5.1)
Sodium: 137 mmol/L (ref 135–145)
Total Bilirubin: 0.3 mg/dL (ref 0.0–1.2)
Total Protein: 6.3 g/dL — ABNORMAL LOW (ref 6.5–8.1)

## 2024-09-30 LAB — CBC WITH DIFFERENTIAL (CANCER CENTER ONLY)
Abs Immature Granulocytes: 0 K/uL (ref 0.00–0.07)
Basophils Absolute: 0.1 K/uL (ref 0.0–0.1)
Basophils Relative: 2 %
Eosinophils Absolute: 0 K/uL (ref 0.0–0.5)
Eosinophils Relative: 1 %
HCT: 34.9 % — ABNORMAL LOW (ref 36.0–46.0)
Hemoglobin: 11.7 g/dL — ABNORMAL LOW (ref 12.0–15.0)
Immature Granulocytes: 0 %
Lymphocytes Relative: 35 %
Lymphs Abs: 1.2 K/uL (ref 0.7–4.0)
MCH: 32.6 pg (ref 26.0–34.0)
MCHC: 33.5 g/dL (ref 30.0–36.0)
MCV: 97.2 fL (ref 80.0–100.0)
Monocytes Absolute: 0.3 K/uL (ref 0.1–1.0)
Monocytes Relative: 10 %
Neutro Abs: 1.8 K/uL (ref 1.7–7.7)
Neutrophils Relative %: 52 %
Platelet Count: 110 K/uL — ABNORMAL LOW (ref 150–400)
RBC: 3.59 MIL/uL — ABNORMAL LOW (ref 3.87–5.11)
RDW: 13.8 % (ref 11.5–15.5)
WBC Count: 3.5 K/uL — ABNORMAL LOW (ref 4.0–10.5)
nRBC: 0 % (ref 0.0–0.2)

## 2024-09-30 MED ORDER — DEXTROSE 5 % IV SOLN: INTRAVENOUS | Status: DC

## 2024-09-30 MED ORDER — DIPHENHYDRAMINE HCL 25 MG PO CAPS
25.0000 mg | ORAL_CAPSULE | Freq: Once | ORAL | Status: AC
  Administered 2024-09-30: 25 mg via ORAL
  Filled 2024-09-30: qty 1

## 2024-09-30 MED ORDER — SODIUM CHLORIDE 0.9 % IV SOLN
150.0000 mg | Freq: Once | INTRAVENOUS | Status: AC
  Administered 2024-09-30: 150 mg via INTRAVENOUS
  Filled 2024-09-30: qty 150

## 2024-09-30 MED ORDER — ACETAMINOPHEN 325 MG PO TABS
650.0000 mg | ORAL_TABLET | Freq: Once | ORAL | Status: AC
  Administered 2024-09-30: 650 mg via ORAL
  Filled 2024-09-30: qty 2

## 2024-09-30 MED ORDER — PALONOSETRON HCL INJECTION 0.25 MG/5ML
0.2500 mg | Freq: Once | INTRAVENOUS | Status: AC
  Administered 2024-09-30: 0.25 mg via INTRAVENOUS
  Filled 2024-09-30: qty 5

## 2024-09-30 MED ORDER — FAM-TRASTUZUMAB DERUXTECAN-NXKI CHEMO 100 MG IV SOLR
4.4000 mg/kg | Freq: Once | INTRAVENOUS | Status: AC
  Administered 2024-09-30: 240 mg via INTRAVENOUS
  Filled 2024-09-30: qty 12

## 2024-09-30 NOTE — Assessment & Plan Note (Signed)
 08/04/2020: Right breast biopsy T2 N0 M1 stage IV IDC grade 3, triple positive with a Ki-67 of 40%, multiple liver lesions biopsy was positive for breast cancer ER/PR positive HER2 negative with a Ki-67 30% 08/31/2020-12/14/2020: TCHP x6 01/29/2021: Letrozole  and palbociclib    11/29/2022-06/05/2023: Olaparib  07/13/2023-03/24/2023: Xeloda      10/24/2022: CT CAP: Multiple new hepatic lesions measuring up to 1.5 cm consistent with metastatic disease, stable 5 mm nodule left costophrenic sulcus 12/05/2021: [facility]: ATM gene mutation (possible treatment option olaparib ), MSI high: Not detected.  Will refer the patient to genetic testing   PET CT scan 03/03/2023: Widespread hypermetabolic metastatic disease within the liver is similar to previous PET/CT 06/02/2023: Interval progression of hepatic metastases.  Progression of bone metastases.  Suspicion of peritoneal disease.   06/13/2023: [facility]: ATM, T p53, FGF R1 amplification, TMB 4.73 mutations 03/30/2024: Right breast biopsy: IDC grade 2, ER 90%, PR 10%, Ki67 40%, HER2 2+ by IHC, FISH positive ratio 2.97 ---------------------------------------------------------------------------------------------------------------------------- Current treatment: Enhertu  cycle 9 Chemo toxicities: Nausea: advised AM Zofran  and Pepcid: I reduced the dosage of Enhertu  for cycle 2 Fatigue Chemo induced anemia: Mild and monitoring closely Echocardiogram 03/24/2024: EF 55 to 60% PET/CT scan 08/26/2024: Improvement in the metastatic disease throughout the liver and abdomen   Return to clinic every 3 weeks for labs and follow-up and Enhertu  infusion

## 2024-09-30 NOTE — Progress Notes (Signed)
 Patient Care Team: Hughie Sharper, MD as PCP - General (Family Medicine) Rene, Rocky RAMAN, MD as Referring Physician (Oncology) Tyree Nanetta SAILOR, RN as Oncology Nurse Navigator Ebbie Cough, MD as Consulting Physician (General Surgery) Dewey Rush, MD as Consulting Physician (Radiation Oncology) Dolphus Reiter, MD as Consulting Physician (Rheumatology) Lenon Oneil BRAVO, MD as Consulting Physician (Obstetrics and Gynecology) Rolan Ezra RAMAN, MD as Consulting Physician (Cardiology) Armbruster, Elspeth SQUIBB, MD as Consulting Physician (Gastroenterology) Jeannetta Carlin Lenis, MD as Referring Physician (Ophthalmology) Jeannetta Carlin Lenis, MD as Referring Physician (Ophthalmology) Odean Potts, MD as Consulting Physician (Hematology and Oncology) Lucila Rush LABOR, RPH-CPP as Pharmacist (Hematology and Oncology)  DIAGNOSIS:  Encounter Diagnosis  Name Primary?   Malignant neoplasm of lower-outer quadrant of right breast of female, estrogen receptor positive (HCC) Yes    SUMMARY OF ONCOLOGIC HISTORY: Oncology History  Malignant neoplasm of lower-outer quadrant of right breast of female, estrogen receptor positive (HCC)  08/04/2020 Initial Diagnosis   Right breast lower outer quadrant biopsy 08/04/2020 for a clinical T2 N0 M1, stage IV invasive ductal carcinoma, grade 3, triple positive, with an MIB-1 of 40%. abdominal MRI 08/24/2020 shows multiple liver lesions highly suspicious for liver metastases. Liver biopsy 09/20/2020 positive for carcinoma, prognostic panel estrogen and progesterone receptor positive, but HER-2 negative (0); MIB-1 of 30%   08/31/2020 - 12/14/2020 Chemotherapy   Neoadjuvant chemotherapy consisting of docetaxel , carboplatin , trastuzumab  and pertuzumab  every 21 days x 6    12/26/2020 Imaging   breast MRI 12/26/2020 showed right breast index lesion decreased to 0.8 cm; 2 additional masses in the right breast stable abdominal MRI 01/29/2021 shows no active disease in  the liver    Chemotherapy   Herceptin  Maintenance initially q 3 weeks, now q 4 weeks   01/29/2021 -  Anti-estrogen oral therapy   anastrozole  and palbociclib     01/29/2023 Genetic Testing   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  Report date is 01/29/2023.   The Multi-Cancer + RNA Panel offered by [facility] includes sequencing and/or deletion/duplication analysis of the following 70 genes:  AIP*, ALK, APC*, ATM*, AXIN2*, BAP1*, BARD1*, BLM*, BMPR1A*, BRCA1*, BRCA2*, BRIP1*, CDC73*, CDH1*, CDK4, CDKN1B*, CDKN2A, CHEK2*, CTNNA1*, DICER1*, EPCAM (del/dup only), EGFR, FH*, FLCN*, GREM1 (promoter dup only), HOXB13, KIT, LZTR1, MAX*, MBD4, MEN1*, MET, MITF, MLH1*, MSH2*, MSH3*, MSH6*, MUTYH*, NF1*, NF2*, NTHL1*, PALB2*, PDGFRA, PMS2*, POLD1*, POLE*, POT1*, PRKAR1A*, PTCH1*, PTEN*, RAD51C*, RAD51D*, RB1*, RET, SDHA* (sequencing only), SDHAF2*, SDHB*, SDHC*, SDHD*, SMAD4*, SMARCA4*, SMARCB1*, SMARCE1*, STK11*, SUFU*, TMEM127*, TP53*, TSC1*, TSC2*, VHL*. RNA analysis is performed for * genes.   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  Report date is 01/29/2023.   The Multi-Cancer + RNA Panel offered by [facility] includes sequencing and/or deletion/duplication analysis of the following 70 genes:  AIP*, ALK, APC*, ATM*, AXIN2*, BAP1*, BARD1*, BLM*, BMPR1A*, BRCA1*, BRCA2*, BRIP1*, CDC73*, CDH1*, CDK4, CDKN1B*, CDKN2A, CHEK2*, CTNNA1*, DICER1*, EPCAM (del/dup only), EGFR, FH*, FLCN*, GREM1 (promoter dup only), HOXB13, KIT, LZTR1, MAX*, MBD4, MEN1*, MET, MITF, MLH1*, MSH2*, MSH3*, MSH6*, MUTYH*, NF1*, NF2*, NTHL1*,  PALB2*, PDGFRA, PMS2*, POLD1*, POLE*, POT1*, PRKAR1A*, PTCH1*, PTEN*, RAD51C*, RAD51D*, RB1*, RET,  SDHA* (sequencing only), SDHAF2*, SDHB*, SDHC*, SDHD*, SMAD4*, SMARCA4*, SMARCB1*, SMARCE1*, STK11*, SUFU*, TMEM127*, TP53*, TSC1*, TSC2*, VHL*. RNA analysis is performed for * genes.     CHIEF COMPLIANT: Enhertu   HISTORY OF PRESENT ILLNESS:  History of Present Illness Erica Wood is a 58 year old female with ongoing cancer treatment who presents for follow-up regarding her treatment response and side effects.  Nausea occurs following cancer treatments but has decreased in severity over recent sessions. Significant hair thinning is present on her head, though she still experiences hair growth on her legs. Her skin color shows improvement, which she views positively.     ALLERGIES:  is allergic to bovine (beef) protein-containing drug products, latex, sulfa antibiotics, and tape.  MEDICATIONS:  Current Outpatient Medications  Medication Sig Dispense Refill   acetaminophen  (TYLENOL ) 500 MG tablet Take 1,000 mg by mouth as needed for moderate pain (pain score 4-6) or headache.     ALPRAZolam  (XANAX ) 0.25 MG tablet Take 1-2 tabs (0.25mg -0.50mg ) 30-60 minutes before procedure. May repeat if needed.Do not drive. 4 tablet 0   amphetamine -dextroamphetamine (ADDERALL XR) 20 MG 24 hr capsule Take 1 capsule (20 mg total) by mouth daily. 30 capsule 0   Ascorbic Acid (VITAMIN C PO) Take 1,000 mg by mouth daily.     Cholecalciferol (VITAMIN D3) 75 MCG (3000 UT) TABS Take 3,000 Units by mouth daily.     diclofenac  Sodium (VOLTAREN ) 1 % GEL Research Patient: Apply 0.5 grams (1 fingertip) to each hand and each foot twice daily for up to 12 weeks 400 g 0   estradiol  (ESTRACE ) 0.1 MG/GM vaginal cream INSERT 1 APPLICATORFUL VAGINALLY 3 TIMES A WEEK 42.5 g 12   famotidine (PEPCID) 20 MG tablet Take 20 mg by mouth at bedtime.     fluconazole  (DIFLUCAN ) 100 MG tablet Take 1 tablet (100 mg total) by mouth  daily. 5 tablet 0   fluorometholone (FML) 0.1 % ophthalmic suspension 1 drop 2 (two) times daily.     hydroxychloroquine  (PLAQUENIL ) 200 MG tablet TAKE 1 TABLET(200 MG) BY MOUTH DAILY 90 tablet 0   lidocaine -prilocaine  (EMLA ) cream Apply to affected area once 30 g 3   Magnesium  Citrate 100 MG TABS Take 1 tablet by mouth daily as needed. 30 tablet 3   minoxidil (LONITEN) 2.5 MG tablet Take 1.25 mg by mouth daily.     ondansetron  (ZOFRAN ) 8 MG tablet Take 1 tablet (8 mg total) by mouth every 8 (eight) hours as needed for nausea or vomiting. 30 tablet 6   oxyCODONE -acetaminophen  (PERCOCET/ROXICET) 5-325 MG tablet Take 1 tablet by mouth every 8 (eight) hours as needed for severe pain (pain score 7-10). 30 tablet 0   prochlorperazine  (COMPAZINE ) 10 MG tablet Take 1 tablet (10 mg total) by mouth every 8 (eight) hours as needed for nausea or vomiting. 30 tablet 6   spironolactone (ALDACTONE) 25 MG tablet Take 25 mg by mouth daily as needed (swelling).     traMADol  (ULTRAM ) 50 MG tablet Take 1-2 tablets (50-100 mg total) by mouth every 6 (six) hours as needed. 60 tablet 0   tretinoin (RETIN-A) 0.1 % cream Apply 1 application  topically at bedtime as needed (acne).     triamterene -hydrochlorothiazide (DYAZIDE) 37.5-25 MG capsule TAKE 1 CAPSULE BY MOUTH DAILY 90 capsule 1   warfarin (COUMADIN ) 5 MG tablet TAKE 1 TABLET(5 MG) BY MOUTH DAILY 30 tablet PRN   No current facility-administered medications for this visit.    PHYSICAL EXAMINATION: ECOG PERFORMANCE STATUS: 1 -  Symptomatic but completely ambulatory  Vitals:   09/30/24 1502  BP: 123/61  Pulse: 66  Resp: 17  Temp: 98 F (36.7 C)  SpO2: 100%   Filed Weights   09/30/24 1502  Weight: 114 lb (51.7 kg)      LABORATORY DATA:  I have reviewed the data as listed    Latest Ref Rng & Units 09/09/2024    2:03 PM 08/19/2024    1:22 PM 07/29/2024    2:14 PM  CMP  Glucose 70 - 99 mg/dL 82  846  87   BUN 6 - 20 mg/dL 12  12  11    Creatinine  0.44 - 1.00 mg/dL 9.21  9.20  9.21   Sodium 135 - 145 mmol/L 137  136  137   Potassium 3.5 - 5.1 mmol/L 4.0  3.7  3.9   Chloride 98 - 111 mmol/L 103  103  104   CO2 22 - 32 mmol/L 29  31  30    Calcium 8.9 - 10.3 mg/dL 8.9  9.1  8.8   Total Protein 6.5 - 8.1 g/dL 6.6  6.6  6.5   Total Bilirubin 0.0 - 1.2 mg/dL 0.4  0.5  0.4   Alkaline Phos 38 - 126 U/L 117  103  99   AST 15 - 41 U/L 31  30  31    ALT 0 - 44 U/L 26  21  22      Lab Results  Component Value Date   WBC 3.5 (L) 09/30/2024   HGB 11.7 (L) 09/30/2024   HCT 34.9 (L) 09/30/2024   MCV 97.2 09/30/2024   PLT 110 (L) 09/30/2024   NEUTROABS 1.8 09/30/2024    ASSESSMENT & PLAN:  Malignant neoplasm of lower-outer quadrant of right breast of female, estrogen receptor positive (HCC) 08/04/2020: Right breast biopsy T2 N0 M1 stage IV IDC grade 3, triple positive with a Ki-67 of 40%, multiple liver lesions biopsy was positive for breast cancer ER/PR positive HER2 negative with a Ki-67 30% 08/31/2020-12/14/2020: TCHP x6 01/29/2021: Letrozole  and palbociclib    11/29/2022-06/05/2023: Olaparib  07/13/2023-03/24/2023: Xeloda      10/24/2022: CT CAP: Multiple new hepatic lesions measuring up to 1.5 cm consistent with metastatic disease, stable 5 mm nodule left costophrenic sulcus 12/05/2021: Guardant360: ATM gene mutation (possible treatment option olaparib ), MSI high: Not detected.  Will refer the patient to genetic testing   PET CT scan 03/03/2023: Widespread hypermetabolic metastatic disease within the liver is similar to previous PET/CT 06/02/2023: Interval progression of hepatic metastases.  Progression of bone metastases.  Suspicion of peritoneal disease.   06/13/2023: Guardant[facility]: ATM, T p53, FGF R1 amplification, TMB 4.73 mutations 03/30/2024: Right breast biopsy: IDC grade 2, ER 90%, PR 10%, Ki67 40%, HER2 2+ by IHC, FISH positive ratio  2.97 ---------------------------------------------------------------------------------------------------------------------------- Current treatment: Enhertu  cycle 9 Chemo toxicities: Nausea: advised AM Zofran  and Pepcid: I reduced the dosage of Enhertu  for cycle 2 Fatigue Chemo induced anemia: Mild and monitoring closely Chemotherapy-induced hair thinning Echocardiogram 03/24/2024: EF 55 to 60% We will repeat another echocardiogram before the next treatment  Continued decrease in the tumor markers PET/CT scan 08/26/2024: Improvement in the metastatic disease throughout the liver and abdomen   Return to clinic every 3 weeks for labs and follow-up and Enhertu  infusion      No orders of the defined types were placed in this encounter.  The patient has a good understanding of the overall plan. she agrees with it. she will call with any problems that may develop before  the next visit here.  I personally spent a total of 30 minutes in the care of the patient today including preparing to see the patient, getting/reviewing separately obtained history, performing a medically appropriate exam/evaluation, counseling and educating, placing orders, referring and communicating with other health care professionals, documenting clinical information in the EHR, independently interpreting results, communicating results, and coordinating care.   Viinay K Arminta Gamm, MD 09/30/24

## 2024-09-30 NOTE — Patient Instructions (Signed)
 [facility] - A DEPT OF [facility]  Discharge Instructions: Thank you for choosing [facility] to provide your oncology and hematology care.   If you have a lab appointment with the Cancer Center, please go directly to the Cancer Center and check in at the registration area.   Wear comfortable clothing and clothing appropriate for easy access to any Portacath or PICC line.   We strive to give you quality time with your provider. You may need to reschedule your appointment if you arrive late (15 or more minutes).  Arriving late affects you and other patients whose appointments are after yours.  Also, if you miss three or more appointments without notifying the office, you may be dismissed from the clinic at the provider's discretion.      For prescription refill requests, have your pharmacy contact our office and allow 72 hours for refills to be completed.    Today you received the following chemotherapy and/or immunotherapy agents: fam-trastuzumab deruxtecan-nxki (ENHERTU     To help prevent nausea and vomiting after your treatment, we encourage you to take your nausea medication as directed.  BELOW ARE SYMPTOMS THAT SHOULD BE REPORTED IMMEDIATELY: *FEVER GREATER THAN 100.4 F (38 C) OR HIGHER *CHILLS OR SWEATING *NAUSEA AND VOMITING THAT IS NOT CONTROLLED WITH YOUR NAUSEA MEDICATION *UNUSUAL SHORTNESS OF BREATH *UNUSUAL BRUISING OR BLEEDING *URINARY PROBLEMS (pain or burning when urinating, or frequent urination) *BOWEL PROBLEMS (unusual diarrhea, constipation, pain near the anus) TENDERNESS IN MOUTH AND THROAT WITH OR WITHOUT PRESENCE OF ULCERS (sore throat, sores in mouth, or a toothache) UNUSUAL RASH, SWELLING OR PAIN  UNUSUAL VAGINAL DISCHARGE OR ITCHING   Items with * indicate a potential emergency and should be followed up as soon as possible or go to the Emergency Department if any problems should occur.  Please show the  CHEMOTHERAPY ALERT CARD or IMMUNOTHERAPY ALERT CARD at check-in to the Emergency Department and triage nurse.  Should you have questions after your visit or need to cancel or reschedule your appointment, please contact [facility] - A DEPT OF [facility]  Dept: [phone]  and follow the prompts.  Office hours are 8:00 a.m. to 4:30 p.m. Monday - Friday. Please note that voicemails left after 4:00 p.m. may not be returned until the following business day.  We are closed weekends and major holidays. You have access to a nurse at all times for urgent questions. Please call the main number to the clinic Dept: [phone] and follow the prompts.   For any non-urgent questions, you may also contact your provider using MyChart. We now offer e-Visits for anyone 51 and older to request care online for non-urgent symptoms. For details visit [email or web address].   Also download the MyChart app! Go to the app store, search "MyChart", open the app, select [facility], and log in with your MyChart username and password.

## 2024-10-01 ENCOUNTER — Other Ambulatory Visit: Payer: Self-pay | Admitting: *Deleted

## 2024-10-01 DIAGNOSIS — Z5181 Encounter for therapeutic drug level monitoring: Secondary | ICD-10-CM

## 2024-10-01 LAB — CANCER ANTIGEN 27.29: CA 27.29: 652.4 U/mL — ABNORMAL HIGH (ref 0.0–38.6)

## 2024-10-03 ENCOUNTER — Other Ambulatory Visit: Payer: Self-pay

## 2024-10-04 ENCOUNTER — Other Ambulatory Visit: Payer: Self-pay | Admitting: *Deleted

## 2024-10-04 MED ORDER — AMPHETAMINE-DEXTROAMPHET ER 20 MG PO CP24: 20.0000 mg | ORAL_CAPSULE | Freq: Every day | ORAL | 0 refills | Status: DC

## 2024-10-04 NOTE — Telephone Encounter (Signed)
 Patient contacted the office and left message requesting a refill on Adderall to the [facility] in [place].    Last Fill: 09/07/2024   Next Visit: 10/21/2024   Last Visit: 04/08/2024   Dx: SLE (systemic lupus erythematosus related syndrome)    Current Dose per office note on 04/08/2024: not discussed   Okay to refill Adderall?

## 2024-10-06 ENCOUNTER — Other Ambulatory Visit (HOSPITAL_COMMUNITY)

## 2024-10-10 NOTE — Progress Notes (Unsigned)
 Office Visit Note  Patient: Erica Wood             Date of Birth: 1966/08/13           MRN: [id number]             PCP: Hughie Sharper, MD Referring: Hughie Sharper, MD Visit Date: 10/21/2024 Occupation: Data Unavailable  Subjective:  No chief complaint on file.   History of Present Illness: Erica Wood is a 58 y.o. female ***    Dr. Gudena.  Patient had an updated PET/CT on 06/02/2023 which revealed interval progression of hepatic metastasis as well as progression of bone metastasis. Under care of Dr. Juris office visit note from 10/21/2023--CA 27-29 showing response-1933 on 10/14/23.  Evaluated by Dr. Gudena on 04/01/24.  Nausea, fatigue, and oral thrush secondary to chemotherapy.  Metastatic disease--Enhertu  cycle 1 day 1 on 03/23/24.   Activities of Daily Living:  Patient reports morning stiffness for *** {minute/hour:19697}.   Patient {ACTIONS;DENIES/REPORTS:21021675::Denies} nocturnal pain.  Difficulty dressing/grooming: {ACTIONS;DENIES/REPORTS:21021675::Denies} Difficulty climbing stairs: {ACTIONS;DENIES/REPORTS:21021675::Denies} Difficulty getting out of chair: {ACTIONS;DENIES/REPORTS:21021675::Denies} Difficulty using hands for taps, buttons, cutlery, and/or writing: {ACTIONS;DENIES/REPORTS:21021675::Denies}  No Rheumatology ROS completed.   PMFS History:  Patient Active Problem List   Diagnosis Date Noted   Migraine aura occurring with and without headache 04/21/2024   Monoallelic mutation of ATM gene 96/73/7975   Genetic testing 02/04/2023   Goals of care, counseling/discussion 05/09/2021   Malignant neoplasm metastatic to liver (HCC) 01/03/2021   Port-A-Cath in place 11/02/2020   Aortic atherosclerosis 09/21/2020   Malignant neoplasm of lower-outer quadrant of right breast of female, estrogen receptor positive (HCC) 08/10/2020   Abnormal cervical Papanicolaou smear 07/12/2020   Pulmonary embolism (HCC) 07/12/2020    Fibrocystic disease of breast 01/22/2018   Primary osteoarthritis of both knees 05/02/2017   Raynaud's disease without gangrene 11/26/2016   Discoid lupus 11/26/2016   Anticardiolipin antibody positive 11/26/2016   Autoimmune disease 11/25/2016   High risk medication use 11/25/2016   Primary osteoarthritis of both hands 11/25/2016   History of DVT (deep vein thrombosis) 11/25/2016   History of pulmonary embolism 11/25/2016   Primary insomnia 11/25/2016   Anxiety 11/25/2016   Irritable bowel syndrome with constipation 11/25/2016   Raynaud's phenomenon 01/03/2016   Varicose veins of bilateral lower extremities with other complications 05/24/2014   Spider veins of both lower extremities 05/24/2014    Past Medical History:  Diagnosis Date   Anti-cardiolipin antibody positive    Anti-cardiolipin antibody syndrome    Anxiety    Arthralgia    Arthritis    bil knees, hands   Atypical chest pain    Autoimmune disease    Autoimmune disease 11/25/2016   Breast cancer metastasized to liver (HCC) 09/2020   Cancer (HCC) 07/2020   right breast IDC   Chronic fatigue    Depression    DVT (deep venous thrombosis) (HCC)    IBS (irritable bowel syndrome)    with constipation   Lupus    Lymphedema    bilateral legs per patient   Monoallelic mutation of ATM gene 96/73/7975   Pulmonary embolus (HCC)    Raynaud's syndrome    Sicca    Thrombocytopenia     Family History  Problem Relation Age of Onset   Other Mother        blood clots   Heart attack Mother    Rheum arthritis Father    Hemachromatosis Father    Heart Problems Father  Cancer - Other Sister        Ewing sarcoma; d. 82   Other Brother        blood clots   Lung cancer Maternal Aunt        smoking hx; 50s   Cancer Paternal Grandmother 50       unknown type   Colon cancer Neg Hx    Stomach cancer Neg Hx    Esophageal cancer Neg Hx    Pancreatic cancer Neg Hx    Liver disease Neg Hx    Migraines Neg Hx    Past  Surgical History:  Procedure Laterality Date   ABLATION  12/2018   leg veins   ABLATION Right 08/2019   venous leg    APPENDECTOMY     BREAST BIOPSY Right 03/30/2024   US  RT BREAST BX W LOC DEV 1ST LESION IMG BX SPEC US  GUIDE 03/30/2024 [facility]   COLON SURGERY     Perforated colon repair after colostomy   EYE SURGERY Bilateral 2022   GANGLION CYST EXCISION     HYSTEROSCOPY WITH D & C N/A 01/13/2014   Procedure: DILATATION AND CURETTAGE /HYSTEROSCOPY with resection ;  Surgeon: Oneil FORBES Piety, MD;  Location: [facility];  Service: Gynecology;  Laterality: N/A;   LAPAROTOMY     PORTACATH PLACEMENT N/A 09/13/2020   Procedure: INSERTION PORT-A-CATH WITH ULTRASOUND GUIDANCE;  Surgeon: Ebbie Cough, MD;  Location: [facility];  Service: General;  Laterality: N/A;   Social History   Tobacco Use   Smoking status: Former    Current packs/day: 0.00    Average packs/day: 0.5 packs/day for 5.0 years (2.5 ttl pk-yrs)    Types: Cigarettes    Start date: 08/06/2006    Quit date: 08/07/2011    Years since quitting: 13.1    Passive exposure: Never   Smokeless tobacco: Never  Vaping Use   Vaping status: Never Used  Substance Use Topics   Alcohol use: Yes    Comment: occasionally   Drug use: No   Social History   Social History Narrative   Pt lives alone    Pt works      Immunization History  Administered Date(s) Administered   PFIZER Comirnaty(Gray Top)Covid-19 Tri-Sucrose Vaccine 08/11/2020     Objective: Vital Signs: There were no vitals taken for this visit.   Physical Exam   Musculoskeletal Exam: ***  CDAI Exam: CDAI Score: -- Patient Global: --; Provider Global: -- Swollen: --; Tender: -- Joint Exam 10/21/2024   No joint exam has been documented for this visit   There is currently no information documented on the homunculus. Go to the Rheumatology activity and complete the homunculus joint exam.  Investigation: No additional  findings.  Imaging: No results found.  Recent Labs: Lab Results  Component Value Date   WBC 3.5 (L) 09/30/2024   HGB 11.7 (L) 09/30/2024   PLT 110 (L) 09/30/2024   NA 137 09/30/2024   K 3.9 09/30/2024   CL 102 09/30/2024   CO2 27 09/30/2024   GLUCOSE 82 09/30/2024   BUN 13 09/30/2024   CREATININE 0.83 09/30/2024   BILITOT 0.3 09/30/2024   ALKPHOS 138 (H) 09/30/2024   AST 36 09/30/2024   ALT 26 09/30/2024   PROT 6.3 (L) 09/30/2024   ALBUMIN 4.0 09/30/2024   CALCIUM 9.1 09/30/2024   GFRAA 95 07/28/2020   QFTBGOLDPLUS Negative 12/19/2021    Speciality Comments: PLQ Eye Exam: 05/05/2024 WNL @ [facility] follow up in  1 year. stage IB invasive ductal carcinoma, grade 3 08/16/20  Procedures:  No procedures performed Allergies: Bovine (beef) protein-containing drug products, Latex, Sulfa antibiotics, and Tape   Assessment / Plan:     Visit Diagnoses: SLE (systemic lupus erythematosus related syndrome) (HCC)  Chronic inflammatory arthritis  High risk medication use  Discoid lupus  Anticardiolipin antibody positive  Malignant neoplasm of lower-outer quadrant of right breast of female, estrogen receptor positive (HCC)  Raynaud's disease without gangrene  Primary osteoarthritis of both hands  Primary osteoarthritis of both knees  Primary insomnia  Vitamin D  deficiency  Other osteoporosis without current pathological fracture  History of pulmonary embolism  History of DVT (deep vein thrombosis)  History of IBS  History of anxiety  Port-A-Cath in place  Family history of hemochromatosis  Orders: No orders of the defined types were placed in this encounter.  No orders of the defined types were placed in this encounter.   Face-to-face time spent with patient was *** minutes. Greater than 50% of time was spent in counseling and coordination of care.  Follow-Up Instructions: No follow-ups on file.   Waddell CHRISTELLA Craze, PA-C  Note - This record  has been created using Dragon software.  Chart creation errors have been sought, but may not always  have been located. Such creation errors do not reflect on  the standard of medical care.

## 2024-10-20 ENCOUNTER — Inpatient Hospital Stay

## 2024-10-20 ENCOUNTER — Inpatient Hospital Stay: Admitting: Hematology and Oncology

## 2024-10-21 ENCOUNTER — Ambulatory Visit: Attending: Physician Assistant | Admitting: Physician Assistant

## 2024-10-21 ENCOUNTER — Encounter: Payer: Self-pay | Admitting: Physician Assistant

## 2024-10-21 VITALS — BP 106/68 | HR 73 | Temp 98.0°F | Resp 14 | Ht 64.0 in

## 2024-10-21 DIAGNOSIS — Z86718 Personal history of other venous thrombosis and embolism: Secondary | ICD-10-CM

## 2024-10-21 DIAGNOSIS — M329 Systemic lupus erythematosus, unspecified: Secondary | ICD-10-CM

## 2024-10-21 DIAGNOSIS — M170 Bilateral primary osteoarthritis of knee: Secondary | ICD-10-CM

## 2024-10-21 DIAGNOSIS — Z8719 Personal history of other diseases of the digestive system: Secondary | ICD-10-CM

## 2024-10-21 DIAGNOSIS — Z79899 Other long term (current) drug therapy: Secondary | ICD-10-CM | POA: Diagnosis not present

## 2024-10-21 DIAGNOSIS — Z95828 Presence of other vascular implants and grafts: Secondary | ICD-10-CM

## 2024-10-21 DIAGNOSIS — F5101 Primary insomnia: Secondary | ICD-10-CM

## 2024-10-21 DIAGNOSIS — M19041 Primary osteoarthritis, right hand: Secondary | ICD-10-CM

## 2024-10-21 DIAGNOSIS — E559 Vitamin D deficiency, unspecified: Secondary | ICD-10-CM

## 2024-10-21 DIAGNOSIS — I7300 Raynaud's syndrome without gangrene: Secondary | ICD-10-CM

## 2024-10-21 DIAGNOSIS — L930 Discoid lupus erythematosus: Secondary | ICD-10-CM

## 2024-10-21 DIAGNOSIS — R760 Raised antibody titer: Secondary | ICD-10-CM

## 2024-10-21 DIAGNOSIS — M1380 Other specified arthritis, unspecified site: Secondary | ICD-10-CM | POA: Diagnosis not present

## 2024-10-21 DIAGNOSIS — Z170 Estrogen receptor positive status [ER+]: Secondary | ICD-10-CM

## 2024-10-21 DIAGNOSIS — Z86711 Personal history of pulmonary embolism: Secondary | ICD-10-CM | POA: Diagnosis not present

## 2024-10-21 DIAGNOSIS — C50511 Malignant neoplasm of lower-outer quadrant of right female breast: Secondary | ICD-10-CM

## 2024-10-21 DIAGNOSIS — Z8349 Family history of other endocrine, nutritional and metabolic diseases: Secondary | ICD-10-CM

## 2024-10-21 DIAGNOSIS — M818 Other osteoporosis without current pathological fracture: Secondary | ICD-10-CM | POA: Diagnosis not present

## 2024-10-21 DIAGNOSIS — Z8659 Personal history of other mental and behavioral disorders: Secondary | ICD-10-CM

## 2024-10-22 ENCOUNTER — Other Ambulatory Visit: Payer: Self-pay

## 2024-10-22 ENCOUNTER — Ambulatory Visit (HOSPITAL_COMMUNITY): Admission: RE | Admit: 2024-10-22 | Discharge: 2024-10-22 | Attending: *Deleted | Admitting: *Deleted

## 2024-10-22 DIAGNOSIS — Z5181 Encounter for therapeutic drug level monitoring: Secondary | ICD-10-CM

## 2024-10-22 DIAGNOSIS — Z79899 Other long term (current) drug therapy: Secondary | ICD-10-CM | POA: Diagnosis not present

## 2024-10-22 LAB — ECHOCARDIOGRAM COMPLETE
AV Vena cont: 0.4 cm
Area-P 1/2: 3.5 cm2
Calc EF: 66.3 %
P 1/2 time: 481 ms
S' Lateral: 3.2 cm
Single Plane A2C EF: 67.7 %
Single Plane A4C EF: 66.2 %

## 2024-10-22 NOTE — Progress Notes (Signed)
°  Echocardiogram 2D Echocardiogram has been performed.  Koleen KANDICE Popper, RDCS 10/22/2024, 11:36 AM

## 2024-10-27 MED FILL — Fosaprepitant Dimeglumine For IV Infusion 150 MG (Base Eq): INTRAVENOUS | Qty: 5 | Status: AC

## 2024-10-28 ENCOUNTER — Inpatient Hospital Stay

## 2024-10-28 ENCOUNTER — Inpatient Hospital Stay: Admitting: Hematology and Oncology

## 2024-10-28 ENCOUNTER — Inpatient Hospital Stay: Attending: Hematology and Oncology

## 2024-10-28 VITALS — BP 121/64 | HR 70 | Temp 97.6°F | Resp 17 | Ht 64.0 in | Wt 114.4 lb

## 2024-10-28 DIAGNOSIS — Z170 Estrogen receptor positive status [ER+]: Secondary | ICD-10-CM | POA: Diagnosis not present

## 2024-10-28 DIAGNOSIS — C787 Secondary malignant neoplasm of liver and intrahepatic bile duct: Secondary | ICD-10-CM | POA: Diagnosis present

## 2024-10-28 DIAGNOSIS — Z5112 Encounter for antineoplastic immunotherapy: Secondary | ICD-10-CM | POA: Insufficient documentation

## 2024-10-28 DIAGNOSIS — Z1731 Human epidermal growth factor receptor 2 positive status: Secondary | ICD-10-CM | POA: Diagnosis not present

## 2024-10-28 DIAGNOSIS — Z1721 Progesterone receptor positive status: Secondary | ICD-10-CM | POA: Diagnosis not present

## 2024-10-28 DIAGNOSIS — C7951 Secondary malignant neoplasm of bone: Secondary | ICD-10-CM | POA: Insufficient documentation

## 2024-10-28 DIAGNOSIS — C50511 Malignant neoplasm of lower-outer quadrant of right female breast: Secondary | ICD-10-CM | POA: Diagnosis not present

## 2024-10-28 DIAGNOSIS — Z79899 Other long term (current) drug therapy: Secondary | ICD-10-CM | POA: Diagnosis not present

## 2024-10-28 LAB — CMP (CANCER CENTER ONLY)
ALT: 27 U/L (ref 0–44)
AST: 42 U/L — ABNORMAL HIGH (ref 15–41)
Albumin: 4.1 g/dL (ref 3.5–5.0)
Alkaline Phosphatase: 127 U/L — ABNORMAL HIGH (ref 38–126)
Anion gap: 7 (ref 5–15)
BUN: 8 mg/dL (ref 6–20)
CO2: 28 mmol/L (ref 22–32)
Calcium: 9 mg/dL (ref 8.9–10.3)
Chloride: 102 mmol/L (ref 98–111)
Creatinine: 0.88 mg/dL (ref 0.44–1.00)
GFR, Estimated: >60 mL/min (ref >60)
Glucose, Bld: 85 mg/dL (ref 70–99)
Potassium: 3.8 mmol/L (ref 3.5–5.1)
Sodium: 137 mmol/L (ref 135–145)
Total Bilirubin: 0.2 mg/dL (ref 0.0–1.2)
Total Protein: 6.6 g/dL (ref 6.5–8.1)

## 2024-10-28 LAB — CBC WITH DIFFERENTIAL (CANCER CENTER ONLY)
Abs Immature Granulocytes: 0.01 K/uL (ref 0.00–0.07)
Basophils Absolute: 0.1 K/uL (ref 0.0–0.1)
Basophils Relative: 1 %
Eosinophils Absolute: 0 K/uL (ref 0.0–0.5)
Eosinophils Relative: 1 %
HCT: 37.1 % (ref 36.0–46.0)
Hemoglobin: 12.5 g/dL (ref 12.0–15.0)
Immature Granulocytes: 0 %
Lymphocytes Relative: 29 %
Lymphs Abs: 1.2 K/uL (ref 0.7–4.0)
MCH: 32.9 pg (ref 26.0–34.0)
MCHC: 33.7 g/dL (ref 30.0–36.0)
MCV: 97.6 fL (ref 80.0–100.0)
Monocytes Absolute: 0.4 K/uL (ref 0.1–1.0)
Monocytes Relative: 10 %
Neutro Abs: 2.4 K/uL (ref 1.7–7.7)
Neutrophils Relative %: 59 %
Platelet Count: 92 K/uL — ABNORMAL LOW (ref 150–400)
RBC: 3.8 MIL/uL — ABNORMAL LOW (ref 3.87–5.11)
RDW: 13.2 % (ref 11.5–15.5)
WBC Count: 4.1 K/uL (ref 4.0–10.5)
nRBC: 0 % (ref 0.0–0.2)

## 2024-10-28 MED ORDER — DIPHENHYDRAMINE HCL 25 MG PO CAPS
25.0000 mg | ORAL_CAPSULE | Freq: Once | ORAL | Status: AC
  Administered 2024-10-28: 16:00:00 25 mg via ORAL
  Filled 2024-10-28: qty 1

## 2024-10-28 MED ORDER — SODIUM CHLORIDE 0.9 % IV SOLN
150.0000 mg | Freq: Once | INTRAVENOUS | Status: AC
  Administered 2024-10-28: 16:00:00 150 mg via INTRAVENOUS
  Filled 2024-10-28: qty 150

## 2024-10-28 MED ORDER — PALONOSETRON HCL INJECTION 0.25 MG/5ML
0.2500 mg | Freq: Once | INTRAVENOUS | Status: AC
  Administered 2024-10-28: 16:00:00 0.25 mg via INTRAVENOUS
  Filled 2024-10-28: qty 5

## 2024-10-28 MED ORDER — SODIUM CHLORIDE 0.9% FLUSH: 10.0000 mL | INTRAVENOUS | Status: DC | PRN

## 2024-10-28 MED ORDER — ACETAMINOPHEN 325 MG PO TABS
650.0000 mg | ORAL_TABLET | Freq: Once | ORAL | Status: AC
  Administered 2024-10-28: 16:00:00 650 mg via ORAL
  Filled 2024-10-28: qty 2

## 2024-10-28 MED ORDER — FAM-TRASTUZUMAB DERUXTECAN-NXKI CHEMO 100 MG IV SOLR
4.4000 mg/kg | Freq: Once | INTRAVENOUS | Status: AC
  Administered 2024-10-28: 17:00:00 240 mg via INTRAVENOUS
  Filled 2024-10-28: qty 12

## 2024-10-28 MED ORDER — DEXTROSE 5 % IV SOLN: INTRAVENOUS | Status: DC

## 2024-10-28 NOTE — Progress Notes (Signed)
 Patient Care Team: Hughie Sharper, MD as PCP - General (Family Medicine) Rene, Rocky RAMAN, MD as Referring Physician (Oncology) Tyree Nanetta SAILOR, RN as Oncology Nurse Navigator Ebbie Cough, MD as Consulting Physician (General Surgery) Dewey Rush, MD as Consulting Physician (Radiation Oncology) Dolphus Reiter, MD as Consulting Physician (Rheumatology) Lenon Oneil BRAVO, MD as Consulting Physician (Obstetrics and Gynecology) Rolan Ezra RAMAN, MD as Consulting Physician (Cardiology) Armbruster, Elspeth SQUIBB, MD as Consulting Physician (Gastroenterology) Jeannetta Carlin Lenis, MD as Referring Physician (Ophthalmology) Jeannetta Carlin Lenis, MD as Referring Physician (Ophthalmology) Odean Potts, MD as Consulting Physician (Hematology and Oncology) Lucila Rush LABOR, RPH-CPP as Pharmacist (Hematology and Oncology)  DIAGNOSIS:  Encounter Diagnosis  Name Primary?   Malignant neoplasm of lower-outer quadrant of right breast of female, estrogen receptor positive (HCC) Yes    SUMMARY OF ONCOLOGIC HISTORY: Oncology History  Malignant neoplasm of lower-outer quadrant of right breast of female, estrogen receptor positive (HCC)  08/04/2020 Initial Diagnosis   Right breast lower outer quadrant biopsy 08/04/2020 for a clinical T2 N0 M1, stage IV invasive ductal carcinoma, grade 3, triple positive, with an MIB-1 of 40%. abdominal MRI 08/24/2020 shows multiple liver lesions highly suspicious for liver metastases. Liver biopsy 09/20/2020 positive for carcinoma, prognostic panel estrogen and progesterone receptor positive, but HER-2 negative (0); MIB-1 of 30%   08/31/2020 - 12/14/2020 Chemotherapy   Neoadjuvant chemotherapy consisting of docetaxel , carboplatin , trastuzumab  and pertuzumab  every 21 days x 6    12/26/2020 Imaging   breast MRI 12/26/2020 showed right breast index lesion decreased to 0.8 cm; 2 additional masses in the right breast stable abdominal MRI 01/29/2021 shows no active disease in  the liver    Chemotherapy   Herceptin  Maintenance initially q 3 weeks, now q 4 weeks   01/29/2021 -  Anti-estrogen oral therapy   anastrozole  and palbociclib     01/29/2023 Genetic Testing   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  Report date is 01/29/2023.   The Multi-Cancer + RNA Panel offered by [facility] includes sequencing and/or deletion/duplication analysis of the following 70 genes:  AIP*, ALK, APC*, ATM*, AXIN2*, BAP1*, BARD1*, BLM*, BMPR1A*, BRCA1*, BRCA2*, BRIP1*, CDC73*, CDH1*, CDK4, CDKN1B*, CDKN2A, CHEK2*, CTNNA1*, DICER1*, EPCAM (del/dup only), EGFR, FH*, FLCN*, GREM1 (promoter dup only), HOXB13, KIT, LZTR1, MAX*, MBD4, MEN1*, MET, MITF, MLH1*, MSH2*, MSH3*, MSH6*, MUTYH*, NF1*, NF2*, NTHL1*, PALB2*, PDGFRA, PMS2*, POLD1*, POLE*, POT1*, PRKAR1A*, PTCH1*, PTEN*, RAD51C*, RAD51D*, RB1*, RET, SDHA* (sequencing only), SDHAF2*, SDHB*, SDHC*, SDHD*, SMAD4*, SMARCA4*, SMARCB1*, SMARCE1*, STK11*, SUFU*, TMEM127*, TP53*, TSC1*, TSC2*, VHL*. RNA analysis is performed for * genes.   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  Report date is 01/29/2023.   The Multi-Cancer + RNA Panel offered by [facility] includes sequencing and/or deletion/duplication analysis of the following 70 genes:  AIP*, ALK, APC*, ATM*, AXIN2*, BAP1*, BARD1*, BLM*, BMPR1A*, BRCA1*, BRCA2*, BRIP1*, CDC73*, CDH1*, CDK4, CDKN1B*, CDKN2A, CHEK2*, CTNNA1*, DICER1*, EPCAM (del/dup only), EGFR, FH*, FLCN*, GREM1 (promoter dup only), HOXB13, KIT, LZTR1, MAX*, MBD4, MEN1*, MET, MITF, MLH1*, MSH2*, MSH3*, MSH6*, MUTYH*, NF1*, NF2*, NTHL1*,  PALB2*, PDGFRA, PMS2*, POLD1*, POLE*, POT1*, PRKAR1A*, PTCH1*, PTEN*, RAD51C*, RAD51D*, RB1*, RET,  SDHA* (sequencing only), SDHAF2*, SDHB*, SDHC*, SDHD*, SMAD4*, SMARCA4*, SMARCB1*, SMARCE1*, STK11*, SUFU*, TMEM127*, TP53*, TSC1*, TSC2*, VHL*. RNA analysis is performed for * genes.     CHIEF COMPLIANT: Enhertu  cycle 10  HISTORY OF PRESENT ILLNESS: Erica Wood is a 51 with above-mentioned metastatic breast cancer on Enhertu .  She is tolerating the treatment extremely well without any problems or concerns.  Does not have any pain or any other complaints today.      ALLERGIES:  is allergic to bovine (beef) protein-containing drug products, latex, sulfa antibiotics, and tape.  MEDICATIONS:  Current Outpatient Medications  Medication Sig Dispense Refill   acetaminophen  (TYLENOL ) 500 MG tablet Take 1,000 mg by mouth as needed for moderate pain (pain score 4-6) or headache.     ALPRAZolam  (XANAX ) 0.25 MG tablet Take 1-2 tabs (0.25mg -0.50mg ) 30-60 minutes before procedure. May repeat if needed.Do not drive. 4 tablet 0   amphetamine -dextroamphetamine (ADDERALL XR) 20 MG 24 hr capsule Take 1 capsule (20 mg total) by mouth daily. 30 capsule 0   Ascorbic Acid (VITAMIN C PO) Take 1,000 mg by mouth daily.     Cholecalciferol (VITAMIN D3) 75 MCG (3000 UT) TABS Take 3,000 Units by mouth daily.     diclofenac  Sodium (VOLTAREN ) 1 % GEL Research Patient: Apply 0.5 grams (1 fingertip) to each hand and each foot twice daily for up to 12 weeks (Patient not taking: Reported on 10/21/2024) 400 g 0   estradiol  (ESTRACE ) 0.1 MG/GM vaginal cream INSERT 1 APPLICATORFUL VAGINALLY 3 TIMES A WEEK 42.5 g 12   famotidine (PEPCID) 20 MG tablet Take 20 mg by mouth at bedtime.     fluconazole  (DIFLUCAN ) 100 MG tablet Take 1 tablet (100 mg total) by mouth daily. 5 tablet 0   fluorometholone (FML) 0.1 % ophthalmic suspension 1 drop 2 (two) times daily.     hydroxychloroquine  (PLAQUENIL ) 200 MG tablet TAKE 1 TABLET(200 MG) BY MOUTH  DAILY 90 tablet 0   lidocaine -prilocaine  (EMLA ) cream Apply to affected area once 30 g 3   Magnesium  Citrate 100 MG TABS Take 1 tablet by mouth daily as needed. 30 tablet 3   minoxidil (LONITEN) 2.5 MG tablet Take 1.25 mg by mouth daily.     ondansetron  (ZOFRAN ) 8 MG tablet Take 1 tablet (8 mg total) by mouth every 8 (eight) hours as needed for nausea or vomiting. 30 tablet 6   oxyCODONE -acetaminophen  (PERCOCET/ROXICET) 5-325 MG tablet Take 1 tablet by mouth every 8 (eight) hours as needed for severe pain (pain score 7-10). 30 tablet 0   prochlorperazine  (COMPAZINE ) 10 MG tablet Take 1 tablet (10 mg total) by mouth every 8 (eight) hours as needed for nausea or vomiting. 30 tablet 6   spironolactone (ALDACTONE) 25 MG tablet Take 25 mg by mouth daily as needed (swelling).     traMADol  (ULTRAM ) 50 MG tablet Take 1-2 tablets (50-100 mg total) by mouth every 6 (six) hours as needed. 60 tablet 0   tretinoin (RETIN-A) 0.1 % cream Apply 1 application  topically at bedtime as needed (acne).     triamterene -hydrochlorothiazide (DYAZIDE) 37.5-25 MG capsule TAKE 1 CAPSULE BY MOUTH DAILY 90 capsule 1   warfarin (COUMADIN ) 5 MG tablet TAKE 1 TABLET(5 MG) BY MOUTH DAILY 30 tablet PRN   No current facility-administered medications for this visit.   Facility-Administered Medications Ordered in Other Visits  Medication Dose Route Frequency Provider Last Rate Last Admin   dextrose  5 % solution   Intravenous Continuous Vikash Nest, MD 10 mL/hr at 10/28/24  1545 New Bag at 10/28/24 1545   fam-trastuzumab deruxtecan-nxki  (ENHERTU ) 240 mg in dextrose  5 % 100 mL chemo infusion  4.4 mg/kg (Treatment Plan Recorded) Intravenous Once Odean Potts, MD       sodium chloride  flush (NS) 0.9 % injection 10 mL  10 mL Intracatheter PRN Odean Potts, MD        PHYSICAL EXAMINATION: ECOG PERFORMANCE STATUS: 1 - Symptomatic but completely ambulatory  Vitals:   10/28/24 1436  BP: 121/64  Pulse: 70  Resp: 17  Temp: 97.6 F  (36.4 C)  SpO2: 100%   Filed Weights   10/28/24 1436  Weight: 114 lb 6.4 oz (51.9 kg)      LABORATORY DATA:  I have reviewed the data as listed    Latest Ref Rng & Units 10/28/2024    2:09 PM 09/30/2024    2:35 PM 09/09/2024    2:03 PM  CMP  Glucose 70 - 99 mg/dL 85  82  82   BUN 6 - 20 mg/dL 8  13  12    Creatinine 0.44 - 1.00 mg/dL 9.11  9.16  9.21   Sodium 135 - 145 mmol/L 137  137  137   Potassium 3.5 - 5.1 mmol/L 3.8  3.9  4.0   Chloride 98 - 111 mmol/L 102  102  103   CO2 22 - 32 mmol/L 28  27  29    Calcium 8.9 - 10.3 mg/dL 9.0  9.1  8.9   Total Protein 6.5 - 8.1 g/dL 6.6  6.3  6.6   Total Bilirubin 0.0 - 1.2 mg/dL 0.2  0.3  0.4   Alkaline Phos 38 - 126 U/L 127  138  117   AST 15 - 41 U/L 42  36  31   ALT 0 - 44 U/L 27  26  26      Lab Results  Component Value Date   WBC 4.1 10/28/2024   HGB 12.5 10/28/2024   HCT 37.1 10/28/2024   MCV 97.6 10/28/2024   PLT 92 (L) 10/28/2024   NEUTROABS 2.4 10/28/2024    ASSESSMENT & PLAN:  Malignant neoplasm of lower-outer quadrant of right breast of female, estrogen receptor positive (HCC) 08/04/2020: Right breast biopsy T2 N0 M1 stage IV IDC grade 3, triple positive with a Ki-67 of 40%, multiple liver lesions biopsy was positive for breast cancer ER/PR positive HER2 negative with a Ki-67 30% 08/31/2020-12/14/2020: TCHP x6 01/29/2021: Letrozole  and palbociclib    11/29/2022-06/05/2023: Olaparib  07/13/2023-03/24/2023: Xeloda      10/24/2022: CT CAP: Multiple new hepatic lesions measuring up to 1.5 cm consistent with metastatic disease, stable 5 mm nodule left costophrenic sulcus 12/05/2021: Guardant360: ATM gene mutation (possible treatment option olaparib ), MSI high: Not detected.  Will refer the patient to genetic testing   PET CT scan 03/03/2023: Widespread hypermetabolic metastatic disease within the liver is similar to previous PET/CT 06/02/2023: Interval progression of hepatic metastases.  Progression of bone metastases.  Suspicion  of peritoneal disease.   06/13/2023: Guardant360: ATM, T p53, FGF R1 amplification, TMB 4.73 mutations 03/30/2024: Right breast biopsy: IDC grade 2, ER 90%, PR 10%, Ki67 40%, HER2 2+ by IHC, FISH positive ratio 2.97 ---------------------------------------------------------------------------------------------------------------------------- Current treatment: Enhertu  cycle 10 Chemo toxicities: Nausea: advised AM Zofran  and Pepcid: I reduced the dosage of Enhertu  for cycle 2 Fatigue Chemo induced anemia: Mild and monitoring closely Chemotherapy-induced hair thinning Echocardiogram 03/24/2024: EF 55 to 60% We will repeat another echocardiogram before the next treatment   Continued decrease in the tumor markers  PET/CT scan 08/26/2024: Improvement in the metastatic disease throughout the liver and abdomen   Return to clinic every 3 weeks for labs and follow-up and Enhertu  infusion        Orders Placed This Encounter  Procedures   CT CHEST ABDOMEN PELVIS W CONTRAST    Standing Status:   Future    Expected Date:   12/16/2024    Expiration Date:   10/28/2025    If indicated for the ordered procedure, I authorize the administration of contrast media per Radiology protocol:   Yes    Does the patient have a contrast media/X-ray dye allergy?:   No    Preferred imaging location?:   [facility]    Release to patient:   Immediate    If indicated for the ordered procedure, I authorize the administration of oral contrast media per Radiology protocol:   Yes   The patient has a good understanding of the overall plan. she agrees with it. she will call with any problems that may develop before the next visit here.  I personally spent a total of 30 minutes in the care of the patient today including preparing to see the patient, getting/reviewing separately obtained history, performing a medically appropriate exam/evaluation, counseling and educating, placing orders, referring and communicating with  other health care professionals, documenting clinical information in the EHR, independently interpreting results, communicating results, and coordinating care.   Erica K Bryauna Byrum, MD 10/28/2024

## 2024-10-28 NOTE — Assessment & Plan Note (Signed)
 08/04/2020: Right breast biopsy T2 N0 M1 stage IV IDC grade 3, triple positive with a Ki-67 of 40%, multiple liver lesions biopsy was positive for breast cancer ER/PR positive HER2 negative with a Ki-67 30% 08/31/2020-12/14/2020: TCHP x6 01/29/2021: Letrozole  and palbociclib    11/29/2022-06/05/2023: Olaparib  07/13/2023-03/24/2023: Xeloda      10/24/2022: CT CAP: Multiple new hepatic lesions measuring up to 1.5 cm consistent with metastatic disease, stable 5 mm nodule left costophrenic sulcus 12/05/2021: Guardant360: ATM gene mutation (possible treatment option olaparib ), MSI high: Not detected.  Will refer the patient to genetic testing   PET CT scan 03/03/2023: Widespread hypermetabolic metastatic disease within the liver is similar to previous PET/CT 06/02/2023: Interval progression of hepatic metastases.  Progression of bone metastases.  Suspicion of peritoneal disease.   06/13/2023: Guardant360: ATM, T p53, FGF R1 amplification, TMB 4.73 mutations 03/30/2024: Right breast biopsy: IDC grade 2, ER 90%, PR 10%, Ki67 40%, HER2 2+ by IHC, FISH positive ratio 2.97 ---------------------------------------------------------------------------------------------------------------------------- Current treatment: Enhertu  cycle 10 Chemo toxicities: Nausea: advised AM Zofran  and Pepcid: I reduced the dosage of Enhertu  for cycle 2 Fatigue Chemo induced anemia: Mild and monitoring closely Chemotherapy-induced hair thinning Echocardiogram 03/24/2024: EF 55 to 60% We will repeat another echocardiogram before the next treatment   Continued decrease in the tumor markers PET/CT scan 08/26/2024: Improvement in the metastatic disease throughout the liver and abdomen   Return to clinic every 3 weeks for labs and follow-up and Enhertu  infusion

## 2024-10-28 NOTE — Patient Instructions (Signed)
 [facility] - A DEPT OF [facility]  Discharge Instructions: Thank you for choosing [facility] to provide your oncology and hematology care.   If you have a lab appointment with the Cancer Center, please go directly to the Cancer Center and check in at the registration area.   Wear comfortable clothing and clothing appropriate for easy access to any Portacath or PICC line.   We strive to give you quality time with your provider. You may need to reschedule your appointment if you arrive late (15 or more minutes).  Arriving late affects you and other patients whose appointments are after yours.  Also, if you miss three or more appointments without notifying the office, you may be dismissed from the clinic at the provider's discretion.      For prescription refill requests, have your pharmacy contact our office and allow 72 hours for refills to be completed.    Today you received the following chemotherapy and/or immunotherapy agents: fam-trastuzumab deruxtecan-nxki (ENHERTU     To help prevent nausea and vomiting after your treatment, we encourage you to take your nausea medication as directed.  BELOW ARE SYMPTOMS THAT SHOULD BE REPORTED IMMEDIATELY: *FEVER GREATER THAN 100.4 F (38 C) OR HIGHER *CHILLS OR SWEATING *NAUSEA AND VOMITING THAT IS NOT CONTROLLED WITH YOUR NAUSEA MEDICATION *UNUSUAL SHORTNESS OF BREATH *UNUSUAL BRUISING OR BLEEDING *URINARY PROBLEMS (pain or burning when urinating, or frequent urination) *BOWEL PROBLEMS (unusual diarrhea, constipation, pain near the anus) TENDERNESS IN MOUTH AND THROAT WITH OR WITHOUT PRESENCE OF ULCERS (sore throat, sores in mouth, or a toothache) UNUSUAL RASH, SWELLING OR PAIN  UNUSUAL VAGINAL DISCHARGE OR ITCHING   Items with * indicate a potential emergency and should be followed up as soon as possible or go to the Emergency Department if any problems should occur.  Please show the  CHEMOTHERAPY ALERT CARD or IMMUNOTHERAPY ALERT CARD at check-in to the Emergency Department and triage nurse.  Should you have questions after your visit or need to cancel or reschedule your appointment, please contact [facility] - A DEPT OF [facility]  Dept: [phone]  and follow the prompts.  Office hours are 8:00 a.m. to 4:30 p.m. Monday - Friday. Please note that voicemails left after 4:00 p.m. may not be returned until the following business day.  We are closed weekends and major holidays. You have access to a nurse at all times for urgent questions. Please call the main number to the clinic Dept: [phone] and follow the prompts.   For any non-urgent questions, you may also contact your provider using MyChart. We now offer e-Visits for anyone 51 and older to request care online for non-urgent symptoms. For details visit [email or web address].   Also download the MyChart app! Go to the app store, search "MyChart", open the app, select [facility], and log in with your MyChart username and password.

## 2024-10-29 LAB — CANCER ANTIGEN 27.29: CA 27.29: 427.4 U/mL — ABNORMAL HIGH (ref 0.0–38.6)

## 2024-11-02 ENCOUNTER — Ambulatory Visit: Payer: Self-pay | Admitting: Physician Assistant

## 2024-11-02 NOTE — Progress Notes (Signed)
 Complements WNL Vitamin D  WNL

## 2024-11-04 LAB — PROTEIN / CREATININE RATIO, URINE

## 2024-11-04 NOTE — Progress Notes (Signed)
 ESR WNL dsDNA is negative-great news!

## 2024-11-05 ENCOUNTER — Other Ambulatory Visit: Payer: Self-pay | Admitting: *Deleted

## 2024-11-05 ENCOUNTER — Other Ambulatory Visit: Payer: Self-pay | Admitting: Hematology and Oncology

## 2024-11-05 DIAGNOSIS — C50511 Malignant neoplasm of lower-outer quadrant of right female breast: Secondary | ICD-10-CM

## 2024-11-05 LAB — PROTEIN / CREATININE RATIO, URINE

## 2024-11-05 LAB — SEDIMENTATION RATE: Sed Rate: 20 mm/h (ref 0–40)

## 2024-11-05 LAB — ANTI-DNA ANTIBODY, DOUBLE-STRANDED: dsDNA Ab: 1 [IU]/mL (ref 0–9)

## 2024-11-05 LAB — C3 AND C4
Complement C3, Serum: 107 mg/dL (ref 82–167)
Complement C4, Serum: 18 mg/dL (ref 12–38)

## 2024-11-05 LAB — VITAMIN D 25 HYDROXY (VIT D DEFICIENCY, FRACTURES): Vit D, 25-Hydroxy: 62.9 ng/mL (ref 30.0–100.0)

## 2024-11-05 MED ORDER — AMPHETAMINE-DEXTROAMPHET ER 20 MG PO CP24: 20.0000 mg | ORAL_CAPSULE | Freq: Every day | ORAL | 0 refills | Status: DC

## 2024-11-05 NOTE — Telephone Encounter (Signed)
 Last Fill: 10/04/2024  Next Visit: 04/21/2025  Last Visit: 10/21/2024  Dx: SLE (systemic lupus erythematosus related syndrome)   Current Dose per office note on 10/21/2024: not mentioned  Okay to refill Adderall?

## 2024-11-05 NOTE — Progress Notes (Signed)
 Urine protein creatinine canceled.

## 2024-11-08 ENCOUNTER — Encounter: Payer: Self-pay | Admitting: Hematology and Oncology

## 2024-11-08 ENCOUNTER — Telehealth: Payer: Self-pay

## 2024-11-08 ENCOUNTER — Ambulatory Visit
Admission: RE | Admit: 2024-11-08 | Discharge: 2024-11-08 | Disposition: A | Source: Ambulatory Visit | Attending: Family Medicine | Admitting: Family Medicine

## 2024-11-08 ENCOUNTER — Other Ambulatory Visit: Payer: Self-pay | Admitting: Family Medicine

## 2024-11-08 DIAGNOSIS — R1010 Upper abdominal pain, unspecified: Secondary | ICD-10-CM

## 2024-11-08 NOTE — Telephone Encounter (Signed)
 S/w regarding message left about L rib cage pain. Patient notes that pain started 1 week prior with no clear etiology.  Patient confirms that area is tender to the touch, but no bruising noted. Denies any injury or activity that could have contributed to the pain. Pain has continued to progress. No n/v, diarrhea, constipation, or changes in urinary habits noted. No SOB or chest pain noted at this time.  Patient encouraged to follow-up at [facility] or ED for further evaluation. Patient declines recommendations of getting emergent follow-up, stating that she is concerned with flu season. Re-advised patient on recommendations at this time. Patient agreeable to moving up CT scan for further imaging and evaluation. Patient provided with number to Central Scheduling to schedule can.  Update sent to providers regarding the situation.  Strict ED precautions reviewed with patient.  Patient verbalized an understanding of the education.

## 2024-11-25 ENCOUNTER — Inpatient Hospital Stay: Attending: Hematology and Oncology | Admitting: Hematology and Oncology

## 2024-11-25 ENCOUNTER — Inpatient Hospital Stay: Attending: Hematology and Oncology

## 2024-11-25 ENCOUNTER — Inpatient Hospital Stay

## 2024-11-25 VITALS — BP 108/70 | HR 68 | Temp 97.9°F | Resp 18 | Ht 64.0 in | Wt 113.5 lb

## 2024-11-25 DIAGNOSIS — Z5112 Encounter for antineoplastic immunotherapy: Secondary | ICD-10-CM | POA: Diagnosis present

## 2024-11-25 DIAGNOSIS — C7951 Secondary malignant neoplasm of bone: Secondary | ICD-10-CM | POA: Diagnosis not present

## 2024-11-25 DIAGNOSIS — Z170 Estrogen receptor positive status [ER+]: Secondary | ICD-10-CM | POA: Diagnosis not present

## 2024-11-25 DIAGNOSIS — Z1732 Human epidermal growth factor receptor 2 negative status: Secondary | ICD-10-CM | POA: Diagnosis not present

## 2024-11-25 DIAGNOSIS — C50511 Malignant neoplasm of lower-outer quadrant of right female breast: Secondary | ICD-10-CM | POA: Insufficient documentation

## 2024-11-25 DIAGNOSIS — Z1721 Progesterone receptor positive status: Secondary | ICD-10-CM | POA: Insufficient documentation

## 2024-11-25 DIAGNOSIS — Z95828 Presence of other vascular implants and grafts: Secondary | ICD-10-CM

## 2024-11-25 DIAGNOSIS — C787 Secondary malignant neoplasm of liver and intrahepatic bile duct: Secondary | ICD-10-CM | POA: Insufficient documentation

## 2024-11-25 DIAGNOSIS — Z79899 Other long term (current) drug therapy: Secondary | ICD-10-CM | POA: Insufficient documentation

## 2024-11-25 LAB — CBC WITH DIFFERENTIAL (CANCER CENTER ONLY)
Abs Immature Granulocytes: 0 K/uL (ref 0.00–0.07)
Basophils Absolute: 0 K/uL (ref 0.0–0.1)
Basophils Relative: 1 %
Eosinophils Absolute: 0 K/uL (ref 0.0–0.5)
Eosinophils Relative: 1 %
HCT: 36.9 % (ref 36.0–46.0)
Hemoglobin: 12.5 g/dL (ref 12.0–15.0)
Immature Granulocytes: 0 %
Lymphocytes Relative: 28 %
Lymphs Abs: 1.2 K/uL (ref 0.7–4.0)
MCH: 32.5 pg (ref 26.0–34.0)
MCHC: 33.9 g/dL (ref 30.0–36.0)
MCV: 95.8 fL (ref 80.0–100.0)
Monocytes Absolute: 0.4 K/uL (ref 0.1–1.0)
Monocytes Relative: 10 %
Neutro Abs: 2.6 K/uL (ref 1.7–7.7)
Neutrophils Relative %: 60 %
Platelet Count: 101 K/uL — ABNORMAL LOW (ref 150–400)
RBC: 3.85 MIL/uL — ABNORMAL LOW (ref 3.87–5.11)
RDW: 13.1 % (ref 11.5–15.5)
WBC Count: 4.3 K/uL (ref 4.0–10.5)
nRBC: 0 % (ref 0.0–0.2)

## 2024-11-25 LAB — CMP (CANCER CENTER ONLY)
ALT: 28 U/L (ref 0–44)
AST: 38 U/L (ref 15–41)
Albumin: 4.2 g/dL (ref 3.5–5.0)
Alkaline Phosphatase: 129 U/L — ABNORMAL HIGH (ref 38–126)
Anion gap: 9 (ref 5–15)
BUN: 11 mg/dL (ref 6–20)
CO2: 27 mmol/L (ref 22–32)
Calcium: 9 mg/dL (ref 8.9–10.3)
Chloride: 101 mmol/L (ref 98–111)
Creatinine: 0.81 mg/dL (ref 0.44–1.00)
GFR, Estimated: >60 mL/min
Glucose, Bld: 94 mg/dL (ref 70–99)
Potassium: 3.6 mmol/L (ref 3.5–5.1)
Sodium: 137 mmol/L (ref 135–145)
Total Bilirubin: 0.4 mg/dL (ref 0.0–1.2)
Total Protein: 6.7 g/dL (ref 6.5–8.1)

## 2024-11-25 MED ORDER — ACETAMINOPHEN 325 MG PO TABS
650.0000 mg | ORAL_TABLET | Freq: Once | ORAL | Status: AC
  Administered 2024-11-25: 650 mg via ORAL
  Filled 2024-11-25: qty 2

## 2024-11-25 MED ORDER — FAM-TRASTUZUMAB DERUXTECAN-NXKI CHEMO 100 MG IV SOLR
4.4000 mg/kg | Freq: Once | INTRAVENOUS | Status: AC
  Administered 2024-11-25: 240 mg via INTRAVENOUS
  Filled 2024-11-25: qty 12

## 2024-11-25 MED ORDER — PALONOSETRON HCL INJECTION 0.25 MG/5ML
0.2500 mg | Freq: Once | INTRAVENOUS | Status: AC
  Administered 2024-11-25: 0.25 mg via INTRAVENOUS
  Filled 2024-11-25: qty 5

## 2024-11-25 MED ORDER — DIPHENHYDRAMINE HCL 25 MG PO CAPS
25.0000 mg | ORAL_CAPSULE | Freq: Once | ORAL | Status: AC
  Administered 2024-11-25: 25 mg via ORAL
  Filled 2024-11-25: qty 1

## 2024-11-25 MED ORDER — SODIUM CHLORIDE 0.9% FLUSH
10.0000 mL | Freq: Once | INTRAVENOUS | Status: AC
  Administered 2024-11-25: 10 mL

## 2024-11-25 MED ORDER — DEXTROSE 5 % IV SOLN: INTRAVENOUS | Status: DC

## 2024-11-25 MED ORDER — SODIUM CHLORIDE 0.9 % IV SOLN
150.0000 mg | Freq: Once | INTRAVENOUS | Status: AC
  Administered 2024-11-25: 150 mg via INTRAVENOUS
  Filled 2024-11-25: qty 150

## 2024-11-25 NOTE — Assessment & Plan Note (Signed)
 08/04/2020: Right breast biopsy T2 N0 M1 stage IV IDC grade 3, triple positive with a Ki-67 of 40%, multiple liver lesions biopsy was positive for breast cancer ER/PR positive HER2 negative with a Ki-67 30% 08/31/2020-12/14/2020: TCHP x6 01/29/2021: Letrozole  and palbociclib    11/29/2022-06/05/2023: Olaparib  07/13/2023-03/24/2023: Xeloda      10/24/2022: CT CAP: Multiple new hepatic lesions measuring up to 1.5 cm consistent with metastatic disease, stable 5 mm nodule left costophrenic sulcus 12/05/2021: Guardant360: ATM gene mutation (possible treatment option olaparib ), MSI high: Not detected.  Will refer the patient to genetic testing   PET CT scan 03/03/2023: Widespread hypermetabolic metastatic disease within the liver is similar to previous PET/CT 06/02/2023: Interval progression of hepatic metastases.  Progression of bone metastases.  Suspicion of peritoneal disease.   06/13/2023: Guardant360: ATM, T p53, FGF R1 amplification, TMB 4.73 mutations 03/30/2024: Right breast biopsy: IDC grade 2, ER 90%, PR 10%, Ki67 40%, HER2 2+ by IHC, FISH positive ratio 2.97 ---------------------------------------------------------------------------------------------------------------------------- Current treatment: Enhertu  cycle 12 Chemo toxicities: Nausea: advised AM Zofran  and Pepcid: I reduced the dosage of Enhertu  for cycle 2 Fatigue Chemo induced anemia: Mild and monitoring closely Chemotherapy-induced hair thinning Echocardiogram 03/24/2024: EF 55 to 60% We will repeat another echocardiogram before the next treatment   Continued decrease in the tumor markers PET/CT scan 08/26/2024: Improvement in the metastatic disease throughout the liver and abdomen CT abdomen 11/08/2024: Unchanged liver metastases, peritoneal nodularity stable T11 mild compression fracture   Return to clinic every 3 weeks for labs and follow-up and Enhertu  infusion

## 2024-11-25 NOTE — Patient Instructions (Signed)
 [facility] - A DEPT OF [facility]  Discharge Instructions: Thank you for choosing [facility] to provide your oncology and hematology care.   If you have a lab appointment with the Cancer Center, please go directly to the Cancer Center and check in at the registration area.   Wear comfortable clothing and clothing appropriate for easy access to any Portacath or PICC line.   We strive to give you quality time with your provider. You may need to reschedule your appointment if you arrive late (15 or more minutes).  Arriving late affects you and other patients whose appointments are after yours.  Also, if you miss three or more appointments without notifying the office, you may be dismissed from the clinic at the provider's discretion.      For prescription refill requests, have your pharmacy contact our office and allow 72 hours for refills to be completed.    Today you received the following chemotherapy and/or immunotherapy agents: Fam-trastuzumab deruxtecan-nxki  (Enhertu )    To help prevent nausea and vomiting after your treatment, we encourage you to take your nausea medication as directed.  BELOW ARE SYMPTOMS THAT SHOULD BE REPORTED IMMEDIATELY: *FEVER GREATER THAN 100.4 F (38 C) OR HIGHER *CHILLS OR SWEATING *NAUSEA AND VOMITING THAT IS NOT CONTROLLED WITH YOUR NAUSEA MEDICATION *UNUSUAL SHORTNESS OF BREATH *UNUSUAL BRUISING OR BLEEDING *URINARY PROBLEMS (pain or burning when urinating, or frequent urination) *BOWEL PROBLEMS (unusual diarrhea, constipation, pain near the anus) TENDERNESS IN MOUTH AND THROAT WITH OR WITHOUT PRESENCE OF ULCERS (sore throat, sores in mouth, or a toothache) UNUSUAL RASH, SWELLING OR PAIN  UNUSUAL VAGINAL DISCHARGE OR ITCHING   Items with * indicate a potential emergency and should be followed up as soon as possible or go to the Emergency Department if any problems should occur.  Please show the  CHEMOTHERAPY ALERT CARD or IMMUNOTHERAPY ALERT CARD at check-in to the Emergency Department and triage nurse.  Should you have questions after your visit or need to cancel or reschedule your appointment, please contact [facility] - A DEPT OF [facility]  Dept: [phone]  and follow the prompts.  Office hours are 8:00 a.m. to 4:30 p.m. Monday - Friday. Please note that voicemails left after 4:00 p.m. may not be returned until the following business day.  We are closed weekends and major holidays. You have access to a nurse at all times for urgent questions. Please call the main number to the clinic Dept: [phone] and follow the prompts.   For any non-urgent questions, you may also contact your provider using MyChart. We now offer e-Visits for anyone 43 and older to request care online for non-urgent symptoms. For details visit [email or web address].   Also download the MyChart app! Go to the app store, search MyChart, open the app, select [facility], and log in with your MyChart username and password.

## 2024-11-25 NOTE — Progress Notes (Signed)
 "  Patient Care Team: Hughie Sharper, MD as PCP - General (Family Medicine) Rene, Rocky RAMAN, MD as Referring Physician (Oncology) Tyree Nanetta SAILOR, RN as Oncology Nurse Navigator Ebbie Cough, MD as Consulting Physician (General Surgery) Dewey Rush, MD as Consulting Physician (Radiation Oncology) Dolphus Reiter, MD as Consulting Physician (Rheumatology) Lenon Oneil BRAVO, MD as Consulting Physician (Obstetrics and Gynecology) Rolan Ezra RAMAN, MD as Consulting Physician (Cardiology) Armbruster, Elspeth SQUIBB, MD as Consulting Physician (Gastroenterology) Jeannetta Carlin Lenis, MD as Referring Physician (Ophthalmology) Jeannetta Carlin Lenis, MD as Referring Physician (Ophthalmology) Odean Potts, MD as Consulting Physician (Hematology and Oncology) Lucila Rush LABOR, RPH-CPP as Pharmacist (Hematology and Oncology)  DIAGNOSIS:  Encounter Diagnosis  Name Primary?   Malignant neoplasm of lower-outer quadrant of right breast of female, estrogen receptor positive (HCC) Yes    SUMMARY OF ONCOLOGIC HISTORY: Oncology History  Malignant neoplasm of lower-outer quadrant of right breast of female, estrogen receptor positive (HCC)  08/04/2020 Initial Diagnosis   Right breast lower outer quadrant biopsy 08/04/2020 for a clinical T2 N0 M1, stage IV invasive ductal carcinoma, grade 3, triple positive, with an MIB-1 of 40%. abdominal MRI 08/24/2020 shows multiple liver lesions highly suspicious for liver metastases. Liver biopsy 09/20/2020 positive for carcinoma, prognostic panel estrogen and progesterone receptor positive, but HER-2 negative (0); MIB-1 of 30%   08/31/2020 - 12/14/2020 Chemotherapy   Neoadjuvant chemotherapy consisting of docetaxel , carboplatin , trastuzumab  and pertuzumab  every 21 days x 6    12/26/2020 Imaging   breast MRI 12/26/2020 showed right breast index lesion decreased to 0.8 cm; 2 additional masses in the right breast stable abdominal MRI 01/29/2021 shows no active disease in  the liver    Chemotherapy   Herceptin  Maintenance initially q 3 weeks, now q 4 weeks   01/29/2021 -  Anti-estrogen oral therapy   anastrozole  and palbociclib     01/29/2023 Genetic Testing   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  Report date is 01/29/2023.   The Multi-Cancer + RNA Panel offered by [facility] includes sequencing and/or deletion/duplication analysis of the following 70 genes:  AIP*, ALK, APC*, ATM*, AXIN2*, BAP1*, BARD1*, BLM*, BMPR1A*, BRCA1*, BRCA2*, BRIP1*, CDC73*, CDH1*, CDK4, CDKN1B*, CDKN2A, CHEK2*, CTNNA1*, DICER1*, EPCAM (del/dup only), EGFR, FH*, FLCN*, GREM1 (promoter dup only), HOXB13, KIT, LZTR1, MAX*, MBD4, MEN1*, MET, MITF, MLH1*, MSH2*, MSH3*, MSH6*, MUTYH*, NF1*, NF2*, NTHL1*, PALB2*, PDGFRA, PMS2*, POLD1*, POLE*, POT1*, PRKAR1A*, PTCH1*, PTEN*, RAD51C*, RAD51D*, RB1*, RET, SDHA* (sequencing only), SDHAF2*, SDHB*, SDHC*, SDHD*, SMAD4*, SMARCA4*, SMARCB1*, SMARCE1*, STK11*, SUFU*, TMEM127*, TP53*, TSC1*, TSC2*, VHL*. RNA analysis is performed for * genes.   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  Report date is 01/29/2023.   The Multi-Cancer + RNA Panel offered by [facility] includes sequencing and/or deletion/duplication analysis of the following 70 genes:  AIP*, ALK, APC*, ATM*, AXIN2*, BAP1*, BARD1*, BLM*, BMPR1A*, BRCA1*, BRCA2*, BRIP1*, CDC73*, CDH1*, CDK4, CDKN1B*, CDKN2A, CHEK2*, CTNNA1*, DICER1*, EPCAM (del/dup only), EGFR, FH*, FLCN*, GREM1 (promoter dup only), HOXB13, KIT, LZTR1, MAX*, MBD4, MEN1*, MET, MITF, MLH1*, MSH2*, MSH3*, MSH6*, MUTYH*, NF1*, NF2*, NTHL1*,  PALB2*, PDGFRA, PMS2*, POLD1*, POLE*, POT1*, PRKAR1A*, PTCH1*, PTEN*, RAD51C*, RAD51D*, RB1*,  RET, SDHA* (sequencing only), SDHAF2*, SDHB*, SDHC*, SDHD*, SMAD4*, SMARCA4*, SMARCB1*, SMARCE1*, STK11*, SUFU*, TMEM127*, TP53*, TSC1*, TSC2*, VHL*. RNA analysis is performed for * genes.     CHIEF COMPLIANT: Cycle 12 Enhertu   HISTORY OF PRESENT ILLNESS:   History of Present Illness Erica Wood is a 59 year old female with stage IV ER+ HER2- right breast cancer with liver metastasis who presents for oncology follow-up and evaluation of recent T11 compression fracture and disease status.  She had acute onset severe pain in the left lower ribs and flank, initially concerning for splenic or rib involvement. Imaging showed a mild T11 compression fracture. The pain has improved and is currently not bothersome.  During this episode, nursing staff raised concern for possible thrombosis and advised emergency evaluation, but she denied thrombotic symptoms and clarified her prior thrombosis history. She also underwent an abdominal CT without contrast on November 08, 2024, despite earlier advice to wait for scheduled imaging. She was relieved that contrast was not used.  Recent labs show stable hemoglobin and platelets and normal renal and hepatic function, with only a mild alkaline phosphatase elevation to 129 U/L. PET/CT on August 26, 2024, and more recent imaging show no new liver or bone disease. She has persistent but stable treatment-related nausea, fatigue, anemia, and alopecia, with no new or worsening oncologic symptoms.      ALLERGIES:  is allergic to bovine (beef) protein-containing drug products, latex, sulfa antibiotics, and tape.  MEDICATIONS:  Current Outpatient Medications  Medication Sig Dispense Refill   acetaminophen  (TYLENOL ) 500 MG tablet Take 1,000 mg by mouth as needed for moderate pain (pain score 4-6) or headache.     ALPRAZolam  (XANAX ) 0.25 MG tablet Take 1-2 tabs  (0.25mg -0.50mg ) 30-60 minutes before procedure. May repeat if needed.Do not drive. 4 tablet 0   amphetamine -dextroamphetamine (ADDERALL XR) 20 MG 24 hr capsule Take 1 capsule (20 mg total) by mouth daily. 30 capsule 0   Ascorbic Acid (VITAMIN C PO) Take 1,000 mg by mouth daily.     Cholecalciferol (VITAMIN D3) 75 MCG (3000 UT) TABS Take 3,000 Units by mouth daily.     diclofenac  Sodium (VOLTAREN ) 1 % GEL Research Patient: Apply 0.5 grams (1 fingertip) to each hand and each foot twice daily for up to 12 weeks 400 g 0   estradiol  (ESTRACE ) 0.1 MG/GM vaginal cream INSERT 1 APPLICATORFUL VAGINALLY 3 TIMES A WEEK 42.5 g 12   famotidine (PEPCID) 20 MG tablet Take 20 mg by mouth at bedtime.     fluconazole  (DIFLUCAN ) 100 MG tablet Take 1 tablet (100 mg total) by mouth daily. 5 tablet 0   fluorometholone (FML) 0.1 % ophthalmic suspension 1 drop 2 (two) times daily.     hydroxychloroquine  (PLAQUENIL ) 200 MG tablet TAKE 1 TABLET(200 MG) BY MOUTH DAILY 90 tablet 0   lidocaine -prilocaine  (EMLA ) cream Apply to affected area once 30 g 3   Magnesium  Citrate 100 MG TABS Take 1 tablet by mouth daily as needed. 30 tablet 3   minoxidil (LONITEN) 2.5 MG tablet Take 1.25 mg by mouth daily.     ondansetron  (ZOFRAN ) 8 MG tablet Take 1 tablet (8 mg total) by mouth every 8 (eight) hours as needed for nausea or vomiting. 30 tablet 6   oxyCODONE -acetaminophen  (PERCOCET/ROXICET) 5-325 MG tablet Take 1 tablet by mouth every 8 (eight) hours as needed for severe pain (pain score 7-10). 30 tablet 0   prochlorperazine  (COMPAZINE ) 10 MG tablet TAKE 1 TABLET(10 MG) BY MOUTH EVERY 6 HOURS AS NEEDED  FOR NAUSEA OR VOMITING 30 tablet 6   spironolactone (ALDACTONE) 25 MG tablet Take 25 mg by mouth daily as needed (swelling).     traMADol  (ULTRAM ) 50 MG tablet Take 1-2 tablets (50-100 mg total) by mouth every 6 (six) hours as needed. 60 tablet 0   tretinoin (RETIN-A) 0.1 % cream Apply 1 application  topically at bedtime as needed (acne).      triamterene -hydrochlorothiazide (DYAZIDE) 37.5-25 MG capsule TAKE 1 CAPSULE BY MOUTH DAILY 90 capsule 1   warfarin (COUMADIN ) 5 MG tablet TAKE 1 TABLET(5 MG) BY MOUTH DAILY 30 tablet PRN   No current facility-administered medications for this visit.   Facility-Administered Medications Ordered in Other Visits  Medication Dose Route Frequency Provider Last Rate Last Admin   dextrose  5 % solution   Intravenous Continuous Odean Potts, MD 10 mL/hr at 11/25/24 1551 New Bag at 11/25/24 1551   fam-trastuzumab deruxtecan-nxki  (ENHERTU ) 240 mg in dextrose  5 % 100 mL chemo infusion  4.4 mg/kg (Treatment Plan Recorded) Intravenous Once Odean Potts, MD        PHYSICAL EXAMINATION: ECOG PERFORMANCE STATUS: 1 - Symptomatic but completely ambulatory  Vitals:   11/25/24 1529  BP: 108/70  Pulse: 68  Resp: 18  Temp: 97.9 F (36.6 C)  SpO2: 100%   Filed Weights   11/25/24 1529  Weight: 113 lb 8 oz (51.5 kg)      LABORATORY DATA:  I have reviewed the data as listed    Latest Ref Rng & Units 11/25/2024    2:49 PM 10/28/2024    2:09 PM 09/30/2024    2:35 PM  CMP  Glucose 70 - 99 mg/dL 94  85  82   BUN 6 - 20 mg/dL 11  8  13    Creatinine 0.44 - 1.00 mg/dL 9.18  9.11  9.16   Sodium 135 - 145 mmol/L 137  137  137   Potassium 3.5 - 5.1 mmol/L 3.6  3.8  3.9   Chloride 98 - 111 mmol/L 101  102  102   CO2 22 - 32 mmol/L 27  28  27    Calcium 8.9 - 10.3 mg/dL 9.0  9.0  9.1   Total Protein 6.5 - 8.1 g/dL 6.7  6.6  6.3   Total Bilirubin 0.0 - 1.2 mg/dL 0.4  0.2  0.3   Alkaline Phos 38 - 126 U/L 129  127  138   AST 15 - 41 U/L 38  42  36   ALT 0 - 44 U/L 28  27  26      Lab Results  Component Value Date   WBC 4.3 11/25/2024   HGB 12.5 11/25/2024   HCT 36.9 11/25/2024   MCV 95.8 11/25/2024   PLT 101 (L) 11/25/2024   NEUTROABS 2.6 11/25/2024    ASSESSMENT & PLAN:  Malignant neoplasm of lower-outer quadrant of right breast of female, estrogen receptor positive (HCC) 08/04/2020: Right  breast biopsy T2 N0 M1 stage IV IDC grade 3, triple positive with a Ki-67 of 40%, multiple liver lesions biopsy was positive for breast cancer ER/PR positive HER2 negative with a Ki-67 30% 08/31/2020-12/14/2020: TCHP x6 01/29/2021: Letrozole  and palbociclib    11/29/2022-06/05/2023: Olaparib  07/13/2023-03/24/2023: Xeloda      10/24/2022: CT CAP: Multiple new hepatic lesions measuring up to 1.5 cm consistent with metastatic disease, stable 5 mm nodule left costophrenic sulcus 12/05/2021: [facility]: ATM gene mutation (possible treatment option olaparib ), MSI high: Not detected.  Will refer the patient to genetic testing   PET  CT scan 03/03/2023: Widespread hypermetabolic metastatic disease within the liver is similar to previous PET/CT 06/02/2023: Interval progression of hepatic metastases.  Progression of bone metastases.  Suspicion of peritoneal disease.   06/13/2023: [facility]: ATM, T p53, FGF R1 amplification, TMB 4.73 mutations 03/30/2024: Right breast biopsy: IDC grade 2, ER 90%, PR 10%, Ki67 40%, HER2 2+ by IHC, FISH positive ratio 2.97 ---------------------------------------------------------------------------------------------------------------------------- Current treatment: Enhertu  cycle 12 Chemo toxicities: Nausea: advised AM Zofran  and Pepcid: I reduced the dosage of Enhertu  for cycle 2 Fatigue Chemo induced anemia: Mild and monitoring closely Chemotherapy-induced hair thinning Echocardiogram 03/24/2024: EF 55 to 60% We will repeat another echocardiogram before the next treatment    Continued decrease in the tumor markers PET/CT scan 08/26/2024: Improvement in the metastatic disease throughout the liver and abdomen CT abdomen 11/08/2024: Unchanged liver metastases, peritoneal nodularity stable T11 mild compression fracture Next scans will be done in February  T11 compression fracture: Appears to be mild.  It may be the reason why she was having so much pain in her left side of the back.   Pain is subsided at this time. Return to clinic every 3 weeks for labs and follow-up and Enhertu  infusion    No orders of the defined types were placed in this encounter.  The patient has a good understanding of the overall plan. she agrees with it. she will call with any problems that may develop before the next visit here.  I personally spent a total of 30 minutes in the care of the patient today including preparing to see the patient, getting/reviewing separately obtained history, performing a medically appropriate exam/evaluation, counseling and educating, placing orders, referring and communicating with other health care professionals, documenting clinical information in the EHR, independently interpreting results, communicating results, and coordinating care.   Viinay K Nikeya Maxim, MD 11/25/24    "

## 2024-11-26 LAB — CANCER ANTIGEN 27.29: CA 27.29: 385 U/mL — ABNORMAL HIGH (ref 0.0–38.6)

## 2024-11-29 ENCOUNTER — Telehealth: Payer: Self-pay

## 2024-11-29 NOTE — Telephone Encounter (Signed)
 LVM for pt in regards to her FMLA/Accommodation forms being completed,faxed,and confirmation received.Left  Call back number.

## 2024-12-02 ENCOUNTER — Other Ambulatory Visit: Payer: Self-pay | Admitting: *Deleted

## 2024-12-02 MED ORDER — AMPHETAMINE-DEXTROAMPHET ER 20 MG PO CP24: 20.0000 mg | ORAL_CAPSULE | Freq: Every day | ORAL | 0 refills | Status: AC

## 2024-12-02 NOTE — Telephone Encounter (Signed)
 Last Fill: 11/05/2024  Next Visit: 04/21/2025  Last Visit: 10/21/2024  Dx: SLE (systemic lupus erythematosus related syndrome)   Current Dose per office note on 10/21/2024: not mentioned   Okay to refill Adderall?

## 2024-12-16 ENCOUNTER — Ambulatory Visit (HOSPITAL_COMMUNITY)

## 2024-12-17 ENCOUNTER — Ambulatory Visit (HOSPITAL_COMMUNITY): Admission: RE | Admit: 2024-12-17 | Source: Ambulatory Visit

## 2024-12-17 DIAGNOSIS — C50511 Malignant neoplasm of lower-outer quadrant of right female breast: Secondary | ICD-10-CM

## 2024-12-17 MED ORDER — IOHEXOL 9 MG/ML PO SOLN: 500.0000 mL | ORAL | Status: AC

## 2024-12-17 MED ORDER — IOHEXOL 300 MG/ML  SOLN
100.0000 mL | Freq: Once | INTRAMUSCULAR | Status: AC | PRN
  Administered 2024-12-17: 100 mL via INTRAVENOUS

## 2024-12-22 ENCOUNTER — Inpatient Hospital Stay: Admitting: Hematology and Oncology

## 2024-12-22 ENCOUNTER — Inpatient Hospital Stay: Attending: Hematology and Oncology

## 2024-12-22 ENCOUNTER — Inpatient Hospital Stay

## 2025-04-21 ENCOUNTER — Ambulatory Visit: Admitting: Physician Assistant
